# Patient Record
Sex: Male | Born: 1945 | Race: Black or African American | Hispanic: No | Marital: Married | Smoking: Former smoker
Health system: Southern US, Community
[De-identification: ages and names within clinical notes are randomized; demographics above are authoritative.]

## PROBLEM LIST (undated history)

## (undated) DIAGNOSIS — I6930 Unspecified sequelae of cerebral infarction: Secondary | ICD-10-CM

## (undated) DIAGNOSIS — Z973 Presence of spectacles and contact lenses: Secondary | ICD-10-CM

## (undated) DIAGNOSIS — Z86718 Personal history of other venous thrombosis and embolism: Secondary | ICD-10-CM

## (undated) DIAGNOSIS — R350 Frequency of micturition: Secondary | ICD-10-CM

## (undated) DIAGNOSIS — R3915 Urgency of urination: Secondary | ICD-10-CM

## (undated) DIAGNOSIS — E7800 Pure hypercholesterolemia, unspecified: Secondary | ICD-10-CM

## (undated) DIAGNOSIS — E119 Type 2 diabetes mellitus without complications: Secondary | ICD-10-CM

## (undated) DIAGNOSIS — N400 Enlarged prostate without lower urinary tract symptoms: Secondary | ICD-10-CM

## (undated) DIAGNOSIS — Z87440 Personal history of urinary (tract) infections: Secondary | ICD-10-CM

## (undated) DIAGNOSIS — E785 Hyperlipidemia, unspecified: Secondary | ICD-10-CM

## (undated) DIAGNOSIS — G709 Myoneural disorder, unspecified: Secondary | ICD-10-CM

## (undated) DIAGNOSIS — N35919 Unspecified urethral stricture, male, unspecified site: Secondary | ICD-10-CM

## (undated) DIAGNOSIS — I10 Essential (primary) hypertension: Secondary | ICD-10-CM

## (undated) DIAGNOSIS — Z974 Presence of external hearing-aid: Secondary | ICD-10-CM

## (undated) DIAGNOSIS — Z95828 Presence of other vascular implants and grafts: Secondary | ICD-10-CM

## (undated) DIAGNOSIS — I82402 Acute embolism and thrombosis of unspecified deep veins of left lower extremity: Secondary | ICD-10-CM

## (undated) DIAGNOSIS — R310 Gross hematuria: Secondary | ICD-10-CM

## (undated) DIAGNOSIS — M21372 Foot drop, left foot: Secondary | ICD-10-CM

## (undated) DIAGNOSIS — R339 Retention of urine, unspecified: Secondary | ICD-10-CM

## (undated) DIAGNOSIS — D649 Anemia, unspecified: Secondary | ICD-10-CM

## (undated) DIAGNOSIS — K5790 Diverticulosis of intestine, part unspecified, without perforation or abscess without bleeding: Secondary | ICD-10-CM

## (undated) DIAGNOSIS — K219 Gastro-esophageal reflux disease without esophagitis: Secondary | ICD-10-CM

## (undated) DIAGNOSIS — R531 Weakness: Secondary | ICD-10-CM

## (undated) DIAGNOSIS — Z87448 Personal history of other diseases of urinary system: Secondary | ICD-10-CM

## (undated) DIAGNOSIS — I6529 Occlusion and stenosis of unspecified carotid artery: Secondary | ICD-10-CM

## (undated) DIAGNOSIS — I639 Cerebral infarction, unspecified: Secondary | ICD-10-CM

## (undated) HISTORY — DX: Acute embolism and thrombosis of unspecified deep veins of left lower extremity: I82.402

## (undated) HISTORY — DX: Occlusion and stenosis of unspecified carotid artery: I65.29

## (undated) HISTORY — DX: Cerebral infarction, unspecified: I63.9

## (undated) HISTORY — DX: Diverticulosis of intestine, part unspecified, without perforation or abscess without bleeding: K57.90

---

## 2006-10-26 HISTORY — PX: IVC FILTER PLACEMENT (ARMC HX): HXRAD1551

## 2011-12-21 DIAGNOSIS — B351 Tinea unguium: Secondary | ICD-10-CM | POA: Diagnosis not present

## 2011-12-21 DIAGNOSIS — L03039 Cellulitis of unspecified toe: Secondary | ICD-10-CM | POA: Diagnosis not present

## 2012-03-17 DIAGNOSIS — M79609 Pain in unspecified limb: Secondary | ICD-10-CM | POA: Diagnosis not present

## 2012-03-17 DIAGNOSIS — B351 Tinea unguium: Secondary | ICD-10-CM | POA: Diagnosis not present

## 2012-08-11 DIAGNOSIS — M79609 Pain in unspecified limb: Secondary | ICD-10-CM | POA: Diagnosis not present

## 2012-08-11 DIAGNOSIS — B351 Tinea unguium: Secondary | ICD-10-CM | POA: Diagnosis not present

## 2012-09-28 ENCOUNTER — Emergency Department (HOSPITAL_COMMUNITY): Payer: Medicare Other

## 2012-09-28 ENCOUNTER — Encounter (HOSPITAL_COMMUNITY): Payer: Self-pay | Admitting: Cardiology

## 2012-09-28 ENCOUNTER — Inpatient Hospital Stay (HOSPITAL_COMMUNITY)
Admission: EM | Admit: 2012-09-28 | Discharge: 2012-10-03 | DRG: 690 | Disposition: A | Discharge status: Home / Self Care | Payer: Medicare Other | Attending: Internal Medicine | Admitting: Internal Medicine

## 2012-09-28 DIAGNOSIS — N10 Acute tubulo-interstitial nephritis: Secondary | ICD-10-CM | POA: Diagnosis present

## 2012-09-28 DIAGNOSIS — I10 Essential (primary) hypertension: Secondary | ICD-10-CM | POA: Diagnosis present

## 2012-09-28 DIAGNOSIS — R6889 Other general symptoms and signs: Secondary | ICD-10-CM | POA: Diagnosis not present

## 2012-09-28 DIAGNOSIS — Z79899 Other long term (current) drug therapy: Secondary | ICD-10-CM

## 2012-09-28 DIAGNOSIS — R112 Nausea with vomiting, unspecified: Secondary | ICD-10-CM | POA: Diagnosis not present

## 2012-09-28 DIAGNOSIS — R5381 Other malaise: Secondary | ICD-10-CM | POA: Diagnosis not present

## 2012-09-28 DIAGNOSIS — E7800 Pure hypercholesterolemia, unspecified: Secondary | ICD-10-CM | POA: Diagnosis present

## 2012-09-28 DIAGNOSIS — H9190 Unspecified hearing loss, unspecified ear: Secondary | ICD-10-CM | POA: Diagnosis present

## 2012-09-28 DIAGNOSIS — N390 Urinary tract infection, site not specified: Secondary | ICD-10-CM | POA: Diagnosis present

## 2012-09-28 DIAGNOSIS — A419 Sepsis, unspecified organism: Secondary | ICD-10-CM | POA: Diagnosis not present

## 2012-09-28 DIAGNOSIS — Z7982 Long term (current) use of aspirin: Secondary | ICD-10-CM

## 2012-09-28 DIAGNOSIS — R509 Fever, unspecified: Secondary | ICD-10-CM | POA: Diagnosis present

## 2012-09-28 DIAGNOSIS — E785 Hyperlipidemia, unspecified: Secondary | ICD-10-CM | POA: Diagnosis present

## 2012-09-28 DIAGNOSIS — R110 Nausea: Secondary | ICD-10-CM | POA: Diagnosis present

## 2012-09-28 DIAGNOSIS — Z833 Family history of diabetes mellitus: Secondary | ICD-10-CM

## 2012-09-28 DIAGNOSIS — Z8673 Personal history of transient ischemic attack (TIA), and cerebral infarction without residual deficits: Secondary | ICD-10-CM

## 2012-09-28 HISTORY — DX: Essential (primary) hypertension: I10

## 2012-09-28 HISTORY — DX: Gastro-esophageal reflux disease without esophagitis: K21.9

## 2012-09-28 HISTORY — DX: Pure hypercholesterolemia, unspecified: E78.00

## 2012-09-28 LAB — CBC WITH DIFFERENTIAL/PLATELET
Basophils Absolute: 0 10*3/uL (ref 0.0–0.1)
Basophils Relative: 0 % (ref 0–1)
Eosinophils Absolute: 0 10*3/uL (ref 0.0–0.7)
Eosinophils Relative: 0 % (ref 0–5)
HCT: 47.9 % (ref 39.0–52.0)
Hemoglobin: 16.4 g/dL (ref 13.0–17.0)
Lymphocytes Relative: 5 % — ABNORMAL LOW (ref 12–46)
Lymphs Abs: 0.9 10*3/uL (ref 0.7–4.0)
MCH: 29.6 pg (ref 26.0–34.0)
MCHC: 34.2 g/dL (ref 30.0–36.0)
MCV: 86.5 fL (ref 78.0–100.0)
Monocytes Absolute: 1.9 10*3/uL — ABNORMAL HIGH (ref 0.1–1.0)
Monocytes Relative: 10 % (ref 3–12)
Neutro Abs: 15.2 10*3/uL — ABNORMAL HIGH (ref 1.7–7.7)
Neutrophils Relative %: 84 % — ABNORMAL HIGH (ref 43–77)
Platelets: 250 10*3/uL (ref 150–400)
RBC: 5.54 MIL/uL (ref 4.22–5.81)
RDW: 13.2 % (ref 11.5–15.5)
WBC: 18 10*3/uL — ABNORMAL HIGH (ref 4.0–10.5)

## 2012-09-28 MED ORDER — ACETAMINOPHEN 325 MG PO TABS
650.0000 mg | ORAL_TABLET | Freq: Once | ORAL | Status: AC
  Administered 2012-09-28: 650 mg via ORAL
  Filled 2012-09-28: qty 2

## 2012-09-28 MED ORDER — SODIUM CHLORIDE 0.9 % IV BOLUS (SEPSIS)
1000.0000 mL | Freq: Once | INTRAVENOUS | Status: AC
  Administered 2012-09-28: 1000 mL via INTRAVENOUS

## 2012-09-28 NOTE — ED Provider Notes (Signed)
History     CSN: [id number]  Arrival date & time 09/28/12  1900   First MD Initiated Contact with Patient 09/28/12 2332      Chief Complaint  Patient presents with  . Fever    (Consider location/radiation/quality/duration/timing/severity/associated sxs/prior treatment) HPI Comments: 66 year old male with a history of urinary infections, stroke and hypertension who presents with one day of fever. Family member states that last night he had a fever, it has been persistent throughout the day, associated with chills and body shaking but not associated with coughing, shortness of breath, abdominal pain, dysuria, diarrhea, rashes, sore throat, sinus tenderness, headache or blurred vision. He was able to ambulate with his cane earlier in the day but throughout the day he has had severe fatigue, increased sleepiness and no appetite. He has not had anything to eat or drink today.  The symptoms are moderate, persistent, nothing makes better or worse.  Patient is a 66 y.o. male presenting with fever. The history is provided by the patient.  Fever Primary symptoms of the febrile illness include fever.    Past Medical History  Diagnosis Date  . Stroke   . Hypertension   . Hypercholesteremia     History reviewed. No pertinent past surgical history.  History reviewed. No pertinent family history.  History  Substance Use Topics  . Smoking status: Not on file  . Smokeless tobacco: Not on file  . Alcohol Use:       Review of Systems  Constitutional: Positive for fever.  All other systems reviewed and are negative.    Allergies  Review of patient's allergies indicates no known allergies.  Home Medications   Current Outpatient Rx  Name  Route  Sig  Dispense  Refill  . ASPIRIN 81 MG PO CHEW   Oral   Chew 81 mg by mouth daily.         Marland Kitchen LOSARTAN POTASSIUM 100 MG PO TABS   Oral   Take 50 mg by mouth daily.         Marland Kitchen PRESCRIPTION MEDICATION   Oral   Take 1 tablet by mouth  daily. For cholesterol           BP 111/60  Pulse 99  Temp 99.9 F (37.7 C) (Oral)  Resp 18  SpO2 97%  Physical Exam  Nursing note and vitals reviewed. Constitutional: He appears well-developed and well-nourished. No distress.  HENT:  Head: Normocephalic and atraumatic.  Mouth/Throat: Oropharynx is clear and moist. No oropharyngeal exudate.  Eyes: Conjunctivae normal and EOM are normal. Pupils are equal, round, and reactive to light. Right eye exhibits no discharge. Left eye exhibits no discharge. No scleral icterus.  Neck: Normal range of motion. Neck supple. No JVD present. No thyromegaly present.  Cardiovascular: Normal rate, regular rhythm, normal heart sounds and intact distal pulses.  Exam reveals no gallop and no friction rub.   No murmur heard. Pulmonary/Chest: Effort normal and breath sounds normal. No respiratory distress. He has no wheezes. He has no rales.  Abdominal: Soft. Bowel sounds are normal. He exhibits no distension and no mass. There is no tenderness.  Musculoskeletal: Normal range of motion. He exhibits no edema and no tenderness.  Lymphadenopathy:    He has no cervical adenopathy.  Neurological: He is alert. Coordination normal.  Skin: Skin is warm and dry. No rash noted. No erythema.  Psychiatric: He has a normal mood and affect. His behavior is normal.    ED Course  Procedures (including critical  care time)  Labs Reviewed  URINALYSIS, ROUTINE W REFLEX MICROSCOPIC - Abnormal; Notable for the following:    Color, Urine AMBER (*)  BIOCHEMICALS MAY BE AFFECTED BY COLOR   APPearance CLOUDY (*)     Hgb urine dipstick LARGE (*)     Ketones, ur 15 (*)     Leukocytes, UA MODERATE (*)     All other components within normal limits  BASIC METABOLIC PANEL - Abnormal; Notable for the following:    Glucose, Bld 163 (*)     GFR calc non Af Amer 69 (*)     GFR calc Af Amer 80 (*)     All other components within normal limits  CBC WITH DIFFERENTIAL - Abnormal;  Notable for the following:    WBC 18.0 (*)     Neutrophils Relative 84 (*)     Neutro Abs 15.2 (*)     Lymphocytes Relative 5 (*)     Monocytes Absolute 1.9 (*)     All other components within normal limits  URINE MICROSCOPIC-ADD ON - Abnormal; Notable for the following:    Bacteria, UA MANY (*)     All other components within normal limits  URINE CULTURE   Dg Chest Port 1 View  09/29/2012  *RADIOLOGY REPORT*  Clinical Data: Fever.  PORTABLE CHEST - 1 VIEW  Comparison: None.  Findings: Heart size is normal.  Both lungs are clear.  No evidence of pleural effusion.  IMPRESSION: No acute findings.   Original Report Authenticated By: Myles Rosenthal, M.D.      1. Sepsis   2. UTI (lower urinary tract infection)       MDM  The patient appears to have some myalgias, he has left arm flexion contracture and mild weakness of the left lower extremities, this is consistent with his prior history of stroke. He does have a mild tachycardia on arrival with a fever but this has defervesced after medications. We'll obtain a chest x-ray urinalysis and lab work to evaluate for source of infection.   Urinalysis positive for urinary tract infection, culture sent, white blood cell count elevated with a leukocytosis, fever has defervesced with medications as has tachycardia, patient appears more stable at this time, we'll start giving IV antibiotics, IV fluids and placed the patient in observation unit for ongoing treatment overnight. Again the patient has no complaints, has normal mental status.  Rocephin ordered  Patient reevaluated, normal metabolic panel, urinalysis with obvious urinary infection, CBC with 18,000 white count. Due to the fact that the patient meets systemic inflammatory response syndrome criteria with a source in the urine it is technically sepsis, he does not appear to be in shock but has persistent tachycardia. He has been given antibiotics, close observation, the patient will be admitted.  Critical care delivered.  CRITICAL CARE Performed by: Vida Roller   Total critical care time: 35  Critical care time was exclusive of separately billable procedures and treating other patients.  Critical care was necessary to treat or prevent imminent or life-threatening deterioration.  Critical care was time spent personally by me on the following activities: development of treatment plan with patient and/or surrogate as well as nursing, discussions with consultants, evaluation of patient's response to treatment, examination of patient, obtaining history from patient or surrogate, ordering and performing treatments and interventions, ordering and review of laboratory studies, ordering and review of radiographic studies, pulse oximetry and re-evaluation of patient's condition.    Vida Roller, MD 09/29/12 [phone]

## 2012-09-28 NOTE — ED Notes (Signed)
Pt reports feeling sick, pt had 103 temp at home, 102 on arrival.  Pt hasn't been drinking anything today but denies n/v/d.  Pt's wife st's he was okay until he went to the gym, they think he picked up a bug at the gym.

## 2012-09-28 NOTE — ED Notes (Signed)
Pt to department via EMS from home- pt with hx of stroke and left-sided deficits. Pt was taking an nap and woke up this afternoon sweaty and hot to touch. Reports a fever of 103 by wife. Reports increased urination today, but denies any pain or odor. Pt remains A&Ox4 on arrival. Bp-160/85  Hr-132

## 2012-09-28 NOTE — ED Notes (Signed)
I gave the patient a warm blanket. 

## 2012-09-29 ENCOUNTER — Encounter (HOSPITAL_COMMUNITY): Payer: Self-pay | Admitting: Internal Medicine

## 2012-09-29 DIAGNOSIS — N390 Urinary tract infection, site not specified: Secondary | ICD-10-CM | POA: Diagnosis present

## 2012-09-29 DIAGNOSIS — Z8673 Personal history of transient ischemic attack (TIA), and cerebral infarction without residual deficits: Secondary | ICD-10-CM

## 2012-09-29 DIAGNOSIS — H9190 Unspecified hearing loss, unspecified ear: Secondary | ICD-10-CM | POA: Diagnosis present

## 2012-09-29 DIAGNOSIS — N10 Acute tubulo-interstitial nephritis: Secondary | ICD-10-CM | POA: Diagnosis present

## 2012-09-29 DIAGNOSIS — R509 Fever, unspecified: Secondary | ICD-10-CM | POA: Diagnosis present

## 2012-09-29 DIAGNOSIS — R110 Nausea: Secondary | ICD-10-CM | POA: Diagnosis present

## 2012-09-29 DIAGNOSIS — I10 Essential (primary) hypertension: Secondary | ICD-10-CM | POA: Diagnosis present

## 2012-09-29 LAB — URINALYSIS, ROUTINE W REFLEX MICROSCOPIC
Bilirubin Urine: NEGATIVE
Glucose, UA: NEGATIVE mg/dL
Ketones, ur: 15 mg/dL — AB
Nitrite: NEGATIVE
Protein, ur: NEGATIVE mg/dL
Specific Gravity, Urine: 1.021 (ref 1.005–1.030)
Urobilinogen, UA: 1 mg/dL (ref 0.0–1.0)
pH: 5.5 (ref 5.0–8.0)

## 2012-09-29 LAB — BASIC METABOLIC PANEL
BUN: 11 mg/dL (ref 6–23)
CO2: 23 mEq/L (ref 19–32)
Calcium: 9.7 mg/dL (ref 8.4–10.5)
Chloride: 97 mEq/L (ref 96–112)
Creatinine, Ser: 1.09 mg/dL (ref 0.50–1.35)
GFR calc Af Amer: 80 mL/min — ABNORMAL LOW (ref >90)
GFR calc non Af Amer: 69 mL/min — ABNORMAL LOW (ref >90)
Glucose, Bld: 163 mg/dL — ABNORMAL HIGH (ref 70–99)
Potassium: 3.7 mEq/L (ref 3.5–5.1)
Sodium: 137 mEq/L (ref 135–145)

## 2012-09-29 LAB — URINE MICROSCOPIC-ADD ON

## 2012-09-29 MED ORDER — ONDANSETRON HCL 4 MG PO TABS: 4.0000 mg | ORAL_TABLET | Freq: Four times a day (QID) | ORAL | Status: DC | PRN

## 2012-09-29 MED ORDER — SIMVASTATIN 20 MG PO TABS
20.0000 mg | ORAL_TABLET | Freq: Every day | ORAL | Status: DC
  Administered 2012-09-29 – 2012-10-02 (×4): 20 mg via ORAL
  Filled 2012-09-29 (×5): qty 1

## 2012-09-29 MED ORDER — ONDANSETRON HCL 4 MG/2ML IJ SOLN: 4.0000 mg | Freq: Four times a day (QID) | INTRAMUSCULAR | Status: DC | PRN

## 2012-09-29 MED ORDER — SODIUM CHLORIDE 0.9 % IV SOLN
Freq: Once | INTRAVENOUS | Status: AC
  Administered 2012-09-29: 02:00:00 via INTRAVENOUS

## 2012-09-29 MED ORDER — DEXTROSE 5 % IV SOLN
1.0000 g | Freq: Once | INTRAVENOUS | Status: AC
  Administered 2012-09-29: 1 g via INTRAVENOUS
  Filled 2012-09-29: qty 10

## 2012-09-29 MED ORDER — SODIUM CHLORIDE 0.9 % IV SOLN
INTRAVENOUS | Status: DC
  Administered 2012-09-29 – 2012-09-30 (×4): via INTRAVENOUS

## 2012-09-29 MED ORDER — LOSARTAN POTASSIUM 50 MG PO TABS
50.0000 mg | ORAL_TABLET | Freq: Every day | ORAL | Status: DC
  Administered 2012-09-29 – 2012-10-03 (×5): 50 mg via ORAL
  Filled 2012-09-29 (×5): qty 1

## 2012-09-29 MED ORDER — ACETAMINOPHEN 650 MG RE SUPP: 650.0000 mg | Freq: Four times a day (QID) | RECTAL | Status: DC | PRN

## 2012-09-29 MED ORDER — ACETAMINOPHEN 325 MG PO TABS
650.0000 mg | ORAL_TABLET | Freq: Four times a day (QID) | ORAL | Status: DC | PRN
  Administered 2012-09-30 – 2012-10-01 (×2): 650 mg via ORAL
  Filled 2012-09-29 (×2): qty 2

## 2012-09-29 MED ORDER — DEXTROSE 5 % IV SOLN
1.0000 g | INTRAVENOUS | Status: DC
  Administered 2012-09-29: 1 g via INTRAVENOUS
  Filled 2012-09-29 (×2): qty 10

## 2012-09-29 MED ORDER — ASPIRIN 81 MG PO CHEW
81.0000 mg | CHEWABLE_TABLET | Freq: Every day | ORAL | Status: DC
  Administered 2012-09-29 – 2012-10-03 (×5): 81 mg via ORAL
  Filled 2012-09-29 (×5): qty 1

## 2012-09-29 MED ORDER — ENOXAPARIN SODIUM 40 MG/0.4ML ~~LOC~~ SOLN
40.0000 mg | SUBCUTANEOUS | Status: DC
  Administered 2012-09-29 – 2012-10-02 (×4): 40 mg via SUBCUTANEOUS
  Filled 2012-09-29 (×5): qty 0.4

## 2012-09-29 NOTE — Progress Notes (Signed)
UR Completed Brooks Kinnan Graves-Bigelow, RN,BSN [phone]  

## 2012-09-29 NOTE — H&P (Signed)
Hospital Admission Note Date: 09/29/2012  Patient name: Robert [name] Medical record number: [id number] Date of birth: 1946-05-09 Age: 66 y.o. Gender: male PCP: DEFAULT,PROVIDER, MD  Medical Service: Internal medicine teaching service  Attending physician: Dr. Rogelia Boga    1st Contact: Sadek    Pager: [phone] 2nd Contact: Manson Passey    Pager: [phone] After 5 pm or weekends: 1st Contact:      Pager: [phone] 2nd Contact:      Pager: [phone]  Chief Complaint: Fever  History of Present Illness: Patient is a pleasant 39 year man with past history of CVA, hypertension, hyper lipidemia who comes to the hospital with chief complaint of fever since yesterday. He is accompanied by his wife who provides most of the history. Patient started having fever yesterday- 103-continuous not resolving. Associated with urinary frequency, loss of appetite for one day, some malaise, confusion. Patient does not report any cough, chest pain, abdominal pain, back pain, Burning micturition, diarrhea, headache, neck pain. 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 Last UTI was about 6 weeks before- was diagnosed at urology clinic in [place] and was given antibiotics for a week- unclear which.  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 Patient followed by a urologist at [facility].  Meds: Current Outpatient Rx  Name  Route  Sig  Dispense  Refill  . ASPIRIN 81 MG PO CHEW   Oral   Chew 81 mg by mouth daily.         Marland Kitchen LOSARTAN POTASSIUM 100 MG PO TABS   Oral   Take 50 mg by mouth daily.         Marland Kitchen PRESCRIPTION MEDICATION   Oral   Take 1 tablet by mouth daily. For cholesterol           Allergies: Allergies as of 09/28/2012  . (No Known Allergies)   Past Medical History  Diagnosis Date  . Stroke   . Hypertension   . Hypercholesteremia    History reviewed. No pertinent past surgical  history. Family History  Problem Relation Age of Onset  . Diabetes Sister   . Lung cancer Brother     Post 9/11 voluntary work in [employer]   History   Social History  . Marital Status: Married    Spouse Name: N/A    Number of Children: N/A  . Years of Education: N/A   Occupational History  . Not on file.   Social History Main Topics  . Smoking status: Not on file  . Smokeless tobacco: Not on file  . Alcohol Use:   . Drug Use:   . Sexually Active:    Other Topics Concern  . Not on file   Social History Narrative   Lives in [place] with wife. He is a Cytogeneticist.Has one son who lives in [place]. He is healthy.[facility] [place], [place].    Review of Systems: As per history of present illness.  Physical Exam: Blood pressure 121/60, pulse 101, temperature 99.9 F (37.7 C), temperature source Oral, resp. rate 31, SpO2 94.00%. Constitutional: Vital signs reviewed.  Patient is a well-developed and well-nourished  in no acute distress and cooperative with exam. Alert and oriented x3.  Head: Normocephalic and atraumatic Mouth: no erythema or exudates, MMM Eyes: PERRL, EOMI, conjunctivae normal, No scleral icterus.  Neck: Supple, Trachea midline normal ROM, No JVD Cardiovascular: Tachycardic, S1 normal, S2  normal, no MRG, pulses symmetric and intact bilaterally Pulmonary/Chest: CTAB, no wheezes, rales, or rhonchi Abdominal: Soft. Non-tender, non-distended, bowel sounds are normal GU: no CVA tenderness Musculoskeletal: No joint deformities, erythema, or stiffness, ROM full and no nontender Neurological: A&O x3, 5/5 strength in right upper and lower extremity. 3/5 strength in left upper lower extremity. Some contracture noted in left upper extremity- consistent with previous CVA.  Skin: Warm, dry and intact. No rash, cyanosis, or clubbing.  Psychiatric: Normal mood and affect. speech and behavior is normal. Judgment and thought content normal. Cognition and  memory are normal.     Lab results: Basic Metabolic Panel:  [facility] 09/28/12 1932  NA 137  K 3.7  CL 97  CO2 23  GLUCOSE 163*  BUN 11  CREATININE 1.09  CALCIUM 9.7  MG --  PHOS --   CBC:  Basename 09/28/12 1932  WBC 18.0*  NEUTROABS 15.2*  HGB 16.4  HCT 47.9  MCV 86.5  PLT 250   Urinalysis:  Basename 09/28/12 2343  COLORURINE AMBER*  LABSPEC 1.021  PHURINE 5.5  GLUCOSEU NEGATIVE  HGBUR LARGE*  BILIRUBINUR NEGATIVE  KETONESUR 15*  PROTEINUR NEGATIVE  UROBILINOGEN 1.0  NITRITE NEGATIVE  LEUKOCYTESUR MODERATE*    Imaging results:  Dg Chest Port 1 View  09/29/2012  *RADIOLOGY REPORT*  Clinical Data: Fever.  PORTABLE CHEST - 1 VIEW  Comparison: None.  Findings: Heart size is normal.  Both lungs are clear.  No evidence of pleural effusion.  IMPRESSION: No acute findings.   Original Report Authenticated By: Myles Rosenthal, M.D.     Other results:  Assessment & Plan by Problem: Principal Problem:  *Acute pyelonephritis Active Problems:  Nausea  Fever  HTN (hypertension)  H/O: CVA (cerebrovascular accident)  Hearing loss  1) Acute pyelonephritis:  Patient with fever more than 38C along with frequency of urination and UA and microscopy showing urinary tract infection- most likely fits the definition of acute pyelonephritis. Patient although does not have any nausea, vomiting or flank pain. He does have history of recurrent UTIs- last infection about 2 weeks before- unclear about the pathogen or the name of antibiotic given. White count- 18,000 with tachycardia. Patient meets criteria for sepsis secondary to UTI/pyelonephritis.  Plan- admit patient to telemetry. - IV hydration with normal saline at 150 cc per hour. - IV ceftriaxone for 3 days and transition to oral antibiotics after urine culture and sensitivity results. - Follow urine culture results to narrow down antibiotics. - No imaging for now. If patient develops fever again and no improvement in  symptoms, we'll consider ultrasound versus CT abdomen to evaluate for complicated pyelonephritis/abscess. - Patient received ceftriaxone before getting blood cultures.  2) History of CVA : CVA about 5 years before with residual left sided weakness. No worsening. - Continue statin and aspirin.   3)  Tachycardia: Mostly secondary to fever, infection and mild dehydration as patient did not have anything to eat or drink yesterday. - Telemetry monitoring overnight. - Normal saline at 150 cc per hour   4) Hypertension : Continue ARB   5) Hyperlipidemia : Continue statin   6) DVT prophylaxis: Lovenox.   Dispo: Disposition is deferred at this time, awaiting improvement of current medical problems. Anticipated discharge in approximately 2 day(s).   The [facility], therefore will not be requiring [facility] follow-up after discharge.   The patient doesnot have transportation limitations that hinder transportation to clinic appointments.  Signed: Adrion Menz 09/29/2012, 10:53 AM

## 2012-09-29 NOTE — Progress Notes (Signed)
   CARE MANAGEMENT NOTE 09/29/2012  Patient:  Robert [name], Robert [name]   Account Number:  [id number]  Date Initiated:  09/29/2012  Documentation initiated by:  GRAVES-BIGELOW,Marlet Korte  Subjective/Objective Assessment:   Pt admitted with fevers. Home with wife.     Action/Plan:   CM will continue to f/u for disposition needs. CM did call the [place] in [place] to make them aware that pt is here. Wife under the impression that [facility] would pay for stay howver pt not 100% service connected.   Anticipated DC Date:  10/03/2012   Anticipated DC Plan:  HOME/SELF CARE  In-house referral  Financial Counselor      DC Planning Services  CM consult      Choice offered to / List presented to:             Status of service:  In process, will continue to follow Medicare Important Message given?   (If response is "NO", the following Medicare IM given date fields will be blank) Date Medicare IM given:   Date Additional Medicare IM given:    Discharge Disposition:    Per UR Regulation:  Reviewed for med. necessity/level of care/duration of stay  If discussed at Long Length of Stay Meetings, dates discussed:    Comments:

## 2012-09-29 NOTE — ED Notes (Addendum)
Pt alert, NAD, calm, interactive, skin W&D, resps e/u, speaking in clear complete sentences, VSS, denies sx or complaints, IVF infusing, IV site U, abx complete, ST on monitor HR 115. Blankets removed.

## 2012-09-29 NOTE — ED Provider Notes (Signed)
  Discussed with internal medicine teaching resident, they will admit.  Vida Roller, MD 09/29/12 [phone]

## 2012-09-29 NOTE — ED Notes (Signed)
Arash Karstens (wife):  [phone]

## 2012-09-29 NOTE — ED Notes (Signed)
Alert, resting, NAD, calm, denies needs, pain or other sx.

## 2012-09-29 NOTE — Clinical Social Work Psychosocial (Signed)
     Clinical Social Work Department BRIEF PSYCHOSOCIAL ASSESSMENT 09/29/2012  Patient:  Robert [name], Robert [name]     Account Number:  [id number]     Admit date:  09/28/2012  Clinical Social Worker:  Margaree Mackintosh  Date/Time:  09/29/2012 12:00 M  Referred by:  Physician  Date Referred:  09/29/2012 Referred for  Other - See comment   Other Referral:   Financial Questions.   Interview type:  Patient Other interview type:   With wife present.    PSYCHOSOCIAL DATA Living Status:  FAMILY Admitted from facility:   Level of care:   Primary support name:  Norm Wray: [phone] Primary support relationship to patient:  SPOUSE Degree of support available:   Adequate.    CURRENT CONCERNS Current Concerns  Financial Resources   Other Concerns:    SOCIAL WORK ASSESSMENT / PLAN Clinical Social Worker recieved referral indicating pt requesting to speak with CSW to discuss financial resources.  CSW reviewed chart and met with pt and pt's wife at bedside. CSW introduced self, explained role, and provided support.  CSW provided active listening; pt's wife shared that pt is a [place] patient and "the [facility] needs to know we are here so we won't get the bill".  CSW reviewed CSW role and validated wife and pt's feelings.  CSW gathered contact information to [place].  CSW staffed case with RNCM who spoke in more detail with pt and wife.  CSW to sign off, please re consult if needed.   Assessment/plan status:  Information/Referral to [facility] Other assessment/ plan:   Information/referral to community resources:   [facility] benefits  RNCM    PATIENTS/FAMILYS RESPONSE TO PLAN OF CARE: Pt and wife were both pleasant and engaged in conversation. Pt and wife thanked CSW for intervention.

## 2012-09-30 DIAGNOSIS — R509 Fever, unspecified: Secondary | ICD-10-CM | POA: Diagnosis not present

## 2012-09-30 DIAGNOSIS — Z8673 Personal history of transient ischemic attack (TIA), and cerebral infarction without residual deficits: Secondary | ICD-10-CM | POA: Diagnosis not present

## 2012-09-30 DIAGNOSIS — N10 Acute tubulo-interstitial nephritis: Secondary | ICD-10-CM | POA: Diagnosis present

## 2012-09-30 DIAGNOSIS — E7800 Pure hypercholesterolemia, unspecified: Secondary | ICD-10-CM | POA: Diagnosis present

## 2012-09-30 DIAGNOSIS — Z79899 Other long term (current) drug therapy: Secondary | ICD-10-CM | POA: Diagnosis not present

## 2012-09-30 DIAGNOSIS — I10 Essential (primary) hypertension: Secondary | ICD-10-CM | POA: Diagnosis present

## 2012-09-30 DIAGNOSIS — Z833 Family history of diabetes mellitus: Secondary | ICD-10-CM | POA: Diagnosis not present

## 2012-09-30 DIAGNOSIS — H9190 Unspecified hearing loss, unspecified ear: Secondary | ICD-10-CM | POA: Diagnosis present

## 2012-09-30 DIAGNOSIS — Z7982 Long term (current) use of aspirin: Secondary | ICD-10-CM | POA: Diagnosis not present

## 2012-09-30 DIAGNOSIS — E785 Hyperlipidemia, unspecified: Secondary | ICD-10-CM | POA: Diagnosis present

## 2012-09-30 LAB — CBC WITH DIFFERENTIAL/PLATELET
Basophils Absolute: 0 10*3/uL (ref 0.0–0.1)
Basophils Relative: 0 % (ref 0–1)
Eosinophils Absolute: 0.1 10*3/uL (ref 0.0–0.7)
Eosinophils Relative: 1 % (ref 0–5)
HCT: 40.4 % (ref 39.0–52.0)
Hemoglobin: 13.9 g/dL (ref 13.0–17.0)
Lymphocytes Relative: 14 % (ref 12–46)
Lymphs Abs: 1.9 10*3/uL (ref 0.7–4.0)
MCH: 29.4 pg (ref 26.0–34.0)
MCHC: 34.4 g/dL (ref 30.0–36.0)
MCV: 85.6 fL (ref 78.0–100.0)
Monocytes Absolute: 2 10*3/uL — ABNORMAL HIGH (ref 0.1–1.0)
Monocytes Relative: 15 % — ABNORMAL HIGH (ref 3–12)
Neutro Abs: 9.1 10*3/uL — ABNORMAL HIGH (ref 1.7–7.7)
Neutrophils Relative %: 70 % (ref 43–77)
Platelets: 214 10*3/uL (ref 150–400)
RBC: 4.72 MIL/uL (ref 4.22–5.81)
RDW: 13.2 % (ref 11.5–15.5)
WBC: 13.1 10*3/uL — ABNORMAL HIGH (ref 4.0–10.5)

## 2012-09-30 LAB — COMPREHENSIVE METABOLIC PANEL
ALT: 17 U/L (ref 0–53)
AST: 24 U/L (ref 0–37)
Albumin: 2.6 g/dL — ABNORMAL LOW (ref 3.5–5.2)
Alkaline Phosphatase: 69 U/L (ref 39–117)
BUN: 9 mg/dL (ref 6–23)
CO2: 23 mEq/L (ref 19–32)
Calcium: 8.6 mg/dL (ref 8.4–10.5)
Chloride: 105 mEq/L (ref 96–112)
Creatinine, Ser: 0.97 mg/dL (ref 0.50–1.35)
GFR calc Af Amer: >90 mL/min (ref >90)
GFR calc non Af Amer: 85 mL/min — ABNORMAL LOW (ref >90)
Glucose, Bld: 118 mg/dL — ABNORMAL HIGH (ref 70–99)
Potassium: 3.7 mEq/L (ref 3.5–5.1)
Sodium: 139 mEq/L (ref 135–145)
Total Bilirubin: 0.9 mg/dL (ref 0.3–1.2)
Total Protein: 6.4 g/dL (ref 6.0–8.3)

## 2012-09-30 MED ORDER — CIPROFLOXACIN IN D5W 400 MG/200ML IV SOLN: 400.0000 mg | Freq: Two times a day (BID) | INTRAVENOUS | Status: DC

## 2012-09-30 MED ORDER — PIPERACILLIN-TAZOBACTAM 3.375 G IVPB
3.3750 g | Freq: Three times a day (TID) | INTRAVENOUS | Status: DC
  Administered 2012-09-30 – 2012-10-01 (×2): 3.375 g via INTRAVENOUS
  Filled 2012-09-30 (×4): qty 50

## 2012-09-30 MED ORDER — CIPROFLOXACIN IN D5W 400 MG/200ML IV SOLN
400.0000 mg | Freq: Two times a day (BID) | INTRAVENOUS | Status: DC
  Filled 2012-09-30 (×2): qty 200

## 2012-09-30 NOTE — Progress Notes (Signed)
Patients wife stated" he feels hot" and requested temp to be taken.  Temp=101.4.  Called Dr. Virgina Organ, she stated she would enter new orders, and to give ordered tylenol.  Will continue to monitor.

## 2012-09-30 NOTE — Progress Notes (Signed)
ANTIBIOTIC CONSULT NOTE - INITIAL  Pharmacy Consult for Zosyn Indication: Pyelonephritis (spiking fevers on Rocephin)  No Known Allergies  Patient Measurements: Height: 6\' 1"  (185.4 cm) Weight: 229 lb 11.2 oz (104.191 kg) IBW/kg (Calculated) : 79.9   Vital Signs: Temp: 101.2 F (38.4 C) (12/06 1838) BP: 122/59 mmHg (12/06 1849) Pulse Rate: 90  (12/06 1849) Intake/Output from previous day: 12/05 0701 - 12/06 0700 In: -  Out: 1050 [Urine:1050] Intake/Output from this shift:    Labs:  [facility] 09/30/12 0553 09/28/12 1932  WBC 13.1* 18.0*  HGB 13.9 16.4  PLT 214 250  LABCREA -- --  CREATININE 0.97 1.09   Estimated Creatinine Clearance: 96.2 ml/min (by C-G formula based on Cr of 0.97). No results found for this basename: VANCOTROUGH:2,VANCOPEAK:2,VANCORANDOM:2,GENTTROUGH:2,GENTPEAK:2,GENTRANDOM:2,TOBRATROUGH:2,TOBRAPEAK:2,TOBRARND:2,AMIKACINPEAK:2,AMIKACINTROU:2,AMIKACIN:2, in the last 72 hours   Microbiology: Recent Results (from the past 720 hour(s))  URINE CULTURE     Status: Normal (Preliminary result)   Collection Time   09/28/12 11:43 PM      Component Value Range Status Comment   Specimen Description URINE, CLEAN CATCH   Final    Special Requests NONE   Final    Culture  Setup Time 09/29/2012 00:22   Final    Colony Count >=100,000 COLONIES/ML   Final    Culture GRAM NEGATIVE RODS   Final    Report Status PENDING   Incomplete     Medical History: Past Medical History  Diagnosis Date  . Stroke   . Hypertension   . Hypercholesteremia   . GERD (gastroesophageal reflux disease)   . Fever 09/29/2012    Assessment: 66 y.o. M admitted on 12/5 with fever and was found to have acute pyelonephritis. The patient continued to spike fevers and pharmacy was consulted to dose Zosyn for broadened coverage. Last fever this evening was 101.2, WBC 13.1 << 18, CrCl~80-100 ml/min.  Goal of Therapy:  Proper antibiotics for infection/cultures adjusted for renal/hepatic  function   Plan:  1. Zosyn 3.375g IV every 8 hours 2. Will continue to follow renal function, culture results, LOT, and antibiotic de-escalation plans   Georgina Pillion, PharmD, BCPS Clinical Pharmacist Pager: [phone] 09/30/2012 8:11 PM

## 2012-09-30 NOTE — H&P (Signed)
Internal Medicine Teaching Service Attending Note Date: 09/30/2012  Patient name: Robert [name]  Medical record number: [id number]  Date of birth: 1945-12-06   I have seen and evaluated Robert [name] and discussed their care with the Residency Team. Please see Dr Eliane Decree H&P for full details. Robert [name] was admitted for fevers, urinary freq, anorexia, and confusion. He was found to have pyuria and was started on Rocephin. He did have temp of 101.4 last PM. Otherwise, he is asymptomatic and back to baseline.  About 5 yrs ago he had a stroke and had to have a foley. That reportedly caused a stricture and he has had recurrent UTI's since. He was on what sounded like nitrofurantoin for 6 months and never had a UTI during that time. He has a urologist at the [place].   PMHx, meds, allergies, soc hx, fam hx, and ROS were reviewed.  PE :  NAD. Lying in bed HRRR LCTAB Ext no edema  Labs and imaging were reviewed   Assessment and Plan: I agree with the formulated Assessment and Plan with the following changes:   1. Pyelonephritis - Pt started empirically on Rocephin until urine cxs back. Will need to be afebrile for 24 hrs until can change ABX to PO and D/C pt. If he cont to be febrile, I agree with Dr Eliane Decree rec to imaging at that time. Due to his h/o stricture and freq UTI he would be at risk of complications. He would like to get est with a PCP and urologist in [place] and we will assist him with that.   2. H/O urinary stricture and freq UTI - will set pt up with local urologist. May need to consider another course of suppressive ABX tx.  3. H/O CVA, HTN, HLD - cont home meds.  Dispo - once afebrile for 24 hrs, change ABX to PO based on cx results and D/C home.  Burns Spain, MD 12/6/20133:32 PM

## 2012-09-30 NOTE — Progress Notes (Signed)
Subjective: Pt had fever overnight as high 101.4 was asymptomatic. Otherwise VSS. Pt feels overall better this AM. Wife was in room and said no AMS since admission and that pt was taking doxycycline for 2 wk course that he finished about a month ago 2/2 discovered UTI at routine yearly urology visit. Pt tolerated advanced diet w/o n/v/d.   Objective: Vital signs in last 24 hours: Filed Vitals:   09/29/12 1030 09/29/12 1130 09/29/12 2210 09/30/12 0700  BP: 121/60 137/68 101/64 111/74  Pulse: 101 98 91 80  Temp:  98.9 F (37.2 C) 101.4 F (38.6 C) 99.5 F (37.5 C)  TempSrc:  Oral    Resp: 31 18 20 18   Height:  6\' 1"  (1.854 m)    Weight:  229 lb 11.2 oz (104.191 kg)    SpO2: 94% 98% 94% 96%   Weight change:   Intake/Output Summary (Last 24 hours) at 09/30/12 1138 Last data filed at 09/30/12 0800  Gross per 24 hour  Intake    360 ml  Output    850 ml  Net   -490 ml   General: resting in bed HEENT: PERRL, EOMI, no scleral icterus Cardiac: RRR, no rubs, murmurs or gallops Pulm: clear to auscultation bilaterally, moving normal volumes of air Abd: soft, nontender, nondistended, BS present Ext: warm and well perfused, no pedal edema Neuro: alert and oriented X3, cranial nerves II-XII grossly intact   Lab Results: Basic Metabolic Panel:  Lab 09/30/12 1610 09/28/12 1932  NA 139 137  K 3.7 3.7  CL 105 97  CO2 23 23  GLUCOSE 118* 163*  BUN 9 11  CREATININE 0.97 1.09  CALCIUM 8.6 9.7  MG -- --  PHOS -- --   Liver Function Tests:  Lab 09/30/12 0553  AST 24  ALT 17  ALKPHOS 69  BILITOT 0.9  PROT 6.4  ALBUMIN 2.6*   CBC:  Lab 09/30/12 0553 09/28/12 1932  WBC 13.1* 18.0*  NEUTROABS 9.1* 15.2*  HGB 13.9 16.4  HCT 40.4 47.9  MCV 85.6 86.5  PLT 214 250   Urinalysis:  Lab 09/28/12 2343  COLORURINE AMBER*  LABSPEC 1.021  PHURINE 5.5  GLUCOSEU NEGATIVE  HGBUR LARGE*  BILIRUBINUR NEGATIVE  KETONESUR 15*  PROTEINUR NEGATIVE  UROBILINOGEN 1.0  NITRITE NEGATIVE   LEUKOCYTESUR MODERATE*   Micro Results: Recent Results (from the past 240 hour(s))  URINE CULTURE     Status: Normal (Preliminary result)   Collection Time   09/28/12 11:43 PM      Component Value Range Status Comment   Specimen Description URINE, CLEAN CATCH   Final    Special Requests NONE   Final    Culture  Setup Time 09/29/2012 00:22   Final    Colony Count >=100,000 COLONIES/ML   Final    Culture GRAM NEGATIVE RODS   Final    Report Status PENDING   Incomplete    Studies/Results: Dg Chest Port 1 View  09/29/2012  *RADIOLOGY REPORT*  Clinical Data: Fever.  PORTABLE CHEST - 1 VIEW  Comparison: None.  Findings: Heart size is normal.  Both lungs are clear.  No evidence of pleural effusion.  IMPRESSION: No acute findings.   Original Report Authenticated By: Myles Rosenthal, M.D.    Medications: I have reviewed the patient's current medications. Scheduled Meds:   . aspirin  81 mg Oral Daily  . cefTRIAXone (ROCEPHIN)  IV  1 g Intravenous Q24H  . enoxaparin (LOVENOX) injection  40 mg Subcutaneous Q24H  .  losartan  50 mg Oral Daily  . simvastatin  20 mg Oral q1800   Continuous Infusions:   . sodium chloride 150 mL/hr at 09/29/12 2150   PRN Meds:.acetaminophen, acetaminophen, ondansetron (ZOFRAN) IV, ondansetron Assessment/Plan: 66 yo AA man pmh CVA, HTN, HLD, previous recurrent UTI s/p long catherterizations currently not w/indwelling foley p/w fever found to have pyelonephritis.   1. Pyelonephritis: Pt SIRS criteria and UA concerning for UTI found to have pyelonephritis. Pt long standing hx of recurrent UTIs last 6 wks ago and competed recent 14 dy course of doxycycline. 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 Pt interested in possibly establishing urology care in [place], [place]. -prelim UCx gram negative rods....Marland Kitchensensitivites pending -cont  IV ceftriaxone -consider imaging if pt spikes another fever for r/o possible abscess   Dispo: Disposition is deferred at this time, awaiting improvement of current medical problems.  Anticipated discharge in approximately 1-2 day(s).   The patient does have a current PCP (DEFAULT,PROVIDER, MD), therefore will be requiring OPC follow-up after discharge.   The patient does not have transportation limitations that hinder transportation to clinic appointments.  .Services Needed at time of discharge: Y = Yes, Blank = No PT:   OT:   RN:   Equipment:   Other:     LOS: 2 days   Christen Bame 09/30/2012, 11:38 AM [phone]

## 2012-10-01 LAB — URINE CULTURE: Colony Count: >=100000

## 2012-10-01 MED ORDER — CIPROFLOXACIN HCL 500 MG PO TABS
500.0000 mg | ORAL_TABLET | Freq: Two times a day (BID) | ORAL | Status: DC
  Administered 2012-10-01 – 2012-10-03 (×5): 500 mg via ORAL
  Filled 2012-10-01 (×7): qty 1

## 2012-10-01 NOTE — Progress Notes (Signed)
Subjective: Patient had fever to 101.2 overnight, which resolved to 99.5 with tylenol.  The patient notes no dysuria or flank tenderness.  Urine cultures today show sensitivity to cipro.  Patient tolerating PO well.  Objective: Vital signs in last 24 hours: Filed Vitals:   09/30/12 1849 09/30/12 2120 09/30/12 2230 10/01/12 0500  BP: 122/59 118/66  116/75  Pulse: 90 81  69  Temp:  99.5 F (37.5 C) 98.2 F (36.8 C) 99.2 F (37.3 C)  TempSrc:      Resp: 22 20  18   Height:      Weight:      SpO2: 98% 95%  95%   Weight change:   Intake/Output Summary (Last 24 hours) at 10/01/12 1322 Last data filed at 10/01/12 0840  Gross per 24 hour  Intake    660 ml  Output    500 ml  Net    160 ml   General: resting in bed HEENT: PERRL, EOMI, no scleral icterus Cardiac: RRR, no rubs, murmurs or gallops Pulm: clear to auscultation bilaterally, moving normal volumes of air Abd: soft, nontender, nondistended, BS present Ext: warm and well perfused, no pedal edema Neuro: alert and oriented X3, cranial nerves II-XII grossly intact   Lab Results: Basic Metabolic Panel:  Lab 09/30/12 4098 09/28/12 1932  NA 139 137  K 3.7 3.7  CL 105 97  CO2 23 23  GLUCOSE 118* 163*  BUN 9 11  CREATININE 0.97 1.09  CALCIUM 8.6 9.7  MG -- --  PHOS -- --   Liver Function Tests:  Lab 09/30/12 0553  AST 24  ALT 17  ALKPHOS 69  BILITOT 0.9  PROT 6.4  ALBUMIN 2.6*   CBC:  Lab 09/30/12 0553 09/28/12 1932  WBC 13.1* 18.0*  NEUTROABS 9.1* 15.2*  HGB 13.9 16.4  HCT 40.4 47.9  MCV 85.6 86.5  PLT 214 250   Urinalysis:  Lab 09/28/12 2343  COLORURINE AMBER*  LABSPEC 1.021  PHURINE 5.5  GLUCOSEU NEGATIVE  HGBUR LARGE*  BILIRUBINUR NEGATIVE  KETONESUR 15*  PROTEINUR NEGATIVE  UROBILINOGEN 1.0  NITRITE NEGATIVE  LEUKOCYTESUR MODERATE*   Micro Results: Recent Results (from the past 240 hour(s))  URINE CULTURE     Status: Normal   Collection Time   09/28/12 11:43 PM      Component Value  Range Status Comment   Specimen Description URINE, CLEAN CATCH   Final    Special Requests NONE   Final    Culture  Setup Time 09/29/2012 00:22   Final    Colony Count >=100,000 COLONIES/ML   Final    Culture KLEBSIELLA PNEUMONIAE   Final    Report Status 10/01/2012 FINAL   Final    Organism ID, Bacteria KLEBSIELLA PNEUMONIAE   Final    Studies/Results: No results found. Medications: I have reviewed the patient's current medications. Scheduled Meds:    . aspirin  81 mg Oral Daily  . ciprofloxacin  500 mg Oral BID  . enoxaparin (LOVENOX) injection  40 mg Subcutaneous Q24H  . losartan  50 mg Oral Daily  . simvastatin  20 mg Oral q1800  . [DISCONTINUED] cefTRIAXone (ROCEPHIN)  IV  1 g Intravenous Q24H  . [DISCONTINUED] ciprofloxacin  400 mg Intravenous Q12H  . [DISCONTINUED] ciprofloxacin  400 mg Intravenous Q12H  . [DISCONTINUED] piperacillin-tazobactam (ZOSYN)  IV  3.375 g Intravenous Q8H   Continuous Infusions:    . [DISCONTINUED] sodium chloride 150 mL/hr at 09/30/12 1230   PRN Meds:.acetaminophen, acetaminophen, ondansetron (  ZOFRAN) IV, ondansetron Assessment/Plan: 66 yo AA man pmh CVA, HTN, HLD, previous recurrent UTI s/p long catherterizations currently not w/indwelling foley p/w fever found to have pyelonephritis.   1. Pyelonephritis: Pt SIRS criteria and UA concerning for UTI found to have pyelonephritis. Pt long standing hx of recurrent UTIs last 6 wks ago and competed recent 14 dy course of doxycycline. 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 Pt interested in possibly establishing urology care in [place], [place]. -UCx shows klebsiella, sensitive to cipro  -discontinue IV zosyn -start PO cipro -consider imaging if pt spikes another fever for r/o possible abscess   Dispo: If patient tolerates cipro and is afebrile for 24 hours,  likely discharge home tomorrow, given patient's good clinical improvement   The patient does have a current PCP (DEFAULT,PROVIDER, MD), therefore will not be requiring OPC follow-up after discharge.   The patient does not have transportation limitations that hinder transportation to clinic appointments.  .Services Needed at time of discharge: Y = Yes, Blank = No PT:   OT:   RN:   Equipment:   Other:     LOS: 3 days   [name] 10/01/2012, 1:22 PM [phone]

## 2012-10-02 LAB — CBC
HCT: 42.2 % (ref 39.0–52.0)
Hemoglobin: 14.5 g/dL (ref 13.0–17.0)
MCH: 29.3 pg (ref 26.0–34.0)
MCHC: 34.4 g/dL (ref 30.0–36.0)
MCV: 85.3 fL (ref 78.0–100.0)
Platelets: 265 10*3/uL (ref 150–400)
RBC: 4.95 MIL/uL (ref 4.22–5.81)
RDW: 13.3 % (ref 11.5–15.5)
WBC: 6.5 10*3/uL (ref 4.0–10.5)

## 2012-10-02 LAB — BASIC METABOLIC PANEL
BUN: 6 mg/dL (ref 6–23)
CO2: 26 mEq/L (ref 19–32)
Calcium: 9.1 mg/dL (ref 8.4–10.5)
Chloride: 104 mEq/L (ref 96–112)
Creatinine, Ser: 1.01 mg/dL (ref 0.50–1.35)
GFR calc Af Amer: 88 mL/min — ABNORMAL LOW (ref >90)
GFR calc non Af Amer: 76 mL/min — ABNORMAL LOW (ref >90)
Glucose, Bld: 134 mg/dL — ABNORMAL HIGH (ref 70–99)
Potassium: 3.8 mEq/L (ref 3.5–5.1)
Sodium: 140 mEq/L (ref 135–145)

## 2012-10-02 NOTE — Progress Notes (Signed)
Subjective: Overnight, patient had a fever to 100.9, which resolved with tylenol.  The patient feels back to baseline.  No lightheadedness, no dysuria.  Objective: Vital signs in last 24 hours: Filed Vitals:   10/01/12 1840 10/01/12 2100 10/02/12 0300 10/02/12 0500  BP:  128/72  138/76  Pulse:  76  94  Temp: 100.9 F (38.3 C) 98.8 F (37.1 C) 98.6 F (37 C) 98.2 F (36.8 C)  TempSrc: Oral Oral  Oral  Resp:  18  18  Height:      Weight:    234 lb (106.142 kg)  SpO2:  97%  99%   Weight change:   Intake/Output Summary (Last 24 hours) at 10/02/12 1212 Last data filed at 10/02/12 1610  Gross per 24 hour  Intake    960 ml  Output    400 ml  Net    560 ml   General: resting in bed HEENT: PERRL, EOMI, no scleral icterus Cardiac: RRR, no rubs, murmurs or gallops Pulm: clear to auscultation bilaterally, moving normal volumes of air Abd: soft, nontender, nondistended, BS present Ext: warm and well perfused, no pedal edema Neuro: alert and oriented X3, cranial nerves II-XII grossly intact   Lab Results: Basic Metabolic Panel:  Lab 10/02/12 9604 09/30/12 0553  NA 140 139  K 3.8 3.7  CL 104 105  CO2 26 23  GLUCOSE 134* 118*  BUN 6 9  CREATININE 1.01 0.97  CALCIUM 9.1 8.6  MG -- --  PHOS -- --   Liver Function Tests:  Lab 09/30/12 0553  AST 24  ALT 17  ALKPHOS 69  BILITOT 0.9  PROT 6.4  ALBUMIN 2.6*   CBC:  Lab 10/02/12 0625 09/30/12 0553 09/28/12 1932  WBC 6.5 13.1* --  NEUTROABS -- 9.1* 15.2*  HGB 14.5 13.9 --  HCT 42.2 40.4 --  MCV 85.3 85.6 --  PLT 265 214 --   Urinalysis:  Lab 09/28/12 2343  COLORURINE AMBER*  LABSPEC 1.021  PHURINE 5.5  GLUCOSEU NEGATIVE  HGBUR LARGE*  BILIRUBINUR NEGATIVE  KETONESUR 15*  PROTEINUR NEGATIVE  UROBILINOGEN 1.0  NITRITE NEGATIVE  LEUKOCYTESUR MODERATE*   Micro Results: Recent Results (from the past 240 hour(s))  URINE CULTURE     Status: Normal   Collection Time   09/28/12 11:43 PM      Component Value  Range Status Comment   Specimen Description URINE, CLEAN CATCH   Final    Special Requests NONE   Final    Culture  Setup Time 09/29/2012 00:22   Final    Colony Count >=100,000 COLONIES/ML   Final    Culture KLEBSIELLA PNEUMONIAE   Final    Report Status 10/01/2012 FINAL   Final    Organism ID, Bacteria KLEBSIELLA PNEUMONIAE   Final   CULTURE, BLOOD (ROUTINE X 2)     Status: Normal (Preliminary result)   Collection Time   09/30/12  8:15 PM      Component Value Range Status Comment   Specimen Description BLOOD RIGHT HAND   Final    Special Requests BOTTLES DRAWN AEROBIC AND ANAEROBIC 10CC   Final    Culture  Setup Time 10/01/2012 00:54   Final    Culture     Final    Value:        BLOOD CULTURE RECEIVED NO GROWTH TO DATE CULTURE WILL BE HELD FOR 5 DAYS BEFORE ISSUING A FINAL NEGATIVE REPORT   Report Status PENDING   Incomplete   CULTURE,  BLOOD (ROUTINE X 2)     Status: Normal (Preliminary result)   Collection Time   09/30/12  8:20 PM      Component Value Range Status Comment   Specimen Description BLOOD RIGHT ARM   Final    Special Requests BOTTLES DRAWN AEROBIC AND ANAEROBIC 10CC   Final    Culture  Setup Time 10/01/2012 00:54   Final    Culture     Final    Value:        BLOOD CULTURE RECEIVED NO GROWTH TO DATE CULTURE WILL BE HELD FOR 5 DAYS BEFORE ISSUING A FINAL NEGATIVE REPORT   Report Status PENDING   Incomplete    Studies/Results: No results found. Medications: I have reviewed the patient's current medications. Scheduled Meds:    . aspirin  81 mg Oral Daily  . ciprofloxacin  500 mg Oral BID  . enoxaparin (LOVENOX) injection  40 mg Subcutaneous Q24H  . losartan  50 mg Oral Daily  . simvastatin  20 mg Oral q1800  . [DISCONTINUED] piperacillin-tazobactam (ZOSYN)  IV  3.375 g Intravenous Q8H   Continuous Infusions:    . [DISCONTINUED] sodium chloride 150 mL/hr at 09/30/12 1230   PRN Meds:.acetaminophen, acetaminophen, ondansetron (ZOFRAN) IV,  ondansetron Assessment/Plan: 66 yo AA man pmh CVA, HTN, HLD, previous recurrent UTI s/p long catherterizations currently not w/indwelling foley p/w fever found to have pyelonephritis.   1. Pyelonephritis: Pt presented with UA concerning for UTI, and was found to have pyelonephritis. Pt has a long standing hx of recurrent UTIs, most recently 6 wks ago, and competed recent 14 dy course of doxycycline. 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 Pt interested in possibly establishing urology care in [place], [place].  Patient continues to have nightly fevers, but they seem to be decreasing in severity. -UCx shows klebsiella, sensitive to cipro  -started cipro 12/7, continuing cipro now -re-sent urine culture given nightly fevers.   Dispo: If patient tolerates cipro and is afebrile for 24 hours, likely discharge home tomorrow, given patient's good clinical improvement   The patient does have a current PCP (DEFAULT,PROVIDER, MD), therefore but requests OPC follow-up after discharge.   The patient does not have transportation limitations that hinder transportation to clinic appointments.  .Services Needed at time of discharge: Y = Yes, Blank = No PT:   OT:   RN:   Equipment:   Other:     LOS: 4 days   [name] 10/02/2012, 12:12 PM [phone]

## 2012-10-03 LAB — URINE CULTURE
Colony Count: NO GROWTH
Culture: NO GROWTH

## 2012-10-03 MED ORDER — CIPROFLOXACIN HCL 500 MG PO TABS: 500.0000 mg | ORAL_TABLET | Freq: Two times a day (BID) | ORAL | Status: AC

## 2012-10-03 MED ORDER — CIPROFLOXACIN HCL 500 MG PO TABS: 500.0000 mg | ORAL_TABLET | Freq: Two times a day (BID) | ORAL | Status: DC

## 2012-10-03 NOTE — Progress Notes (Signed)
Subjective: Pt had no acute events overnight. VSS and was afebrile. Pt feels back to baseline and tolerated an advanced diet w/o n/v/d. Pt was also able to ambulate w/o concern.   Objective: Vital signs in last 24 hours: Filed Vitals:   10/02/12 0500 10/02/12 1332 10/02/12 2100 10/03/12 0636  BP: 138/76 129/74 139/82 135/82  Pulse: 94 84 82 78  Temp: 98.2 F (36.8 C) 99 F (37.2 C) 99.4 F (37.4 C) 98.6 F (37 C)  TempSrc: Oral  Oral Oral  Resp: 18  18 18   Height:      Weight: 234 lb (106.142 kg)   231 lb 14.8 oz (105.2 kg)  SpO2: 99% 97% 96% 97%   Weight change: -2 lb 1.2 oz (-0.942 kg)  Intake/Output Summary (Last 24 hours) at 10/03/12 0830 Last data filed at 10/03/12 0600  Gross per 24 hour  Intake   1260 ml  Output   1400 ml  Net   -140 ml   General: sitting up in chair, comfortable, NAD HEENT: PERRL, EOMI, no scleral icterus Cardiac: RRR, no rubs, murmurs or gallops Pulm: clear to auscultation bilaterally, moving normal volumes of air Abd: soft, nontender, nondistended, BS present Ext: warm and well perfused, no pedal edema Neuro: alert and oriented X3, cranial nerves II-XII grossly intact   Lab Results: Basic Metabolic Panel:  Lab 10/02/12 1610 09/30/12 0553  NA 140 139  K 3.8 3.7  CL 104 105  CO2 26 23  GLUCOSE 134* 118*  BUN 6 9  CREATININE 1.01 0.97  CALCIUM 9.1 8.6  MG -- --  PHOS -- --   Liver Function Tests:  Lab 09/30/12 0553  AST 24  ALT 17  ALKPHOS 69  BILITOT 0.9  PROT 6.4  ALBUMIN 2.6*   CBC:  Lab 10/02/12 0625 09/30/12 0553 09/28/12 1932  WBC 6.5 13.1* --  NEUTROABS -- 9.1* 15.2*  HGB 14.5 13.9 --  HCT 42.2 40.4 --  MCV 85.3 85.6 --  PLT 265 214 --   Urinalysis:  Lab 09/28/12 2343  COLORURINE AMBER*  LABSPEC 1.021  PHURINE 5.5  GLUCOSEU NEGATIVE  HGBUR LARGE*  BILIRUBINUR NEGATIVE  KETONESUR 15*  PROTEINUR NEGATIVE  UROBILINOGEN 1.0  NITRITE NEGATIVE  LEUKOCYTESUR MODERATE*   Micro Results: Recent Results (from  the past 240 hour(s))  URINE CULTURE     Status: Normal   Collection Time   09/28/12 11:43 PM      Component Value Range Status Comment   Specimen Description URINE, CLEAN CATCH   Final    Special Requests NONE   Final    Culture  Setup Time 09/29/2012 00:22   Final    Colony Count >=100,000 COLONIES/ML   Final    Culture KLEBSIELLA PNEUMONIAE   Final    Report Status 10/01/2012 FINAL   Final    Organism ID, Bacteria KLEBSIELLA PNEUMONIAE   Final   CULTURE, BLOOD (ROUTINE X 2)     Status: Normal (Preliminary result)   Collection Time   09/30/12  8:15 PM      Component Value Range Status Comment   Specimen Description BLOOD RIGHT HAND   Final    Special Requests BOTTLES DRAWN AEROBIC AND ANAEROBIC 10CC   Final    Culture  Setup Time 10/01/2012 00:54   Final    Culture     Final    Value:        BLOOD CULTURE RECEIVED NO GROWTH TO DATE CULTURE WILL BE HELD FOR 5  DAYS BEFORE ISSUING A FINAL NEGATIVE REPORT   Report Status PENDING   Incomplete   CULTURE, BLOOD (ROUTINE X 2)     Status: Normal (Preliminary result)   Collection Time   09/30/12  8:20 PM      Component Value Range Status Comment   Specimen Description BLOOD RIGHT ARM   Final    Special Requests BOTTLES DRAWN AEROBIC AND ANAEROBIC 10CC   Final    Culture  Setup Time 10/01/2012 00:54   Final    Culture     Final    Value:        BLOOD CULTURE RECEIVED NO GROWTH TO DATE CULTURE WILL BE HELD FOR 5 DAYS BEFORE ISSUING A FINAL NEGATIVE REPORT   Report Status PENDING   Incomplete    Studies/Results: No results found. Medications: I have reviewed the patient's current medications. Scheduled Meds:    . aspirin  81 mg Oral Daily  . ciprofloxacin  500 mg Oral BID  . enoxaparin (LOVENOX) injection  40 mg Subcutaneous Q24H  . losartan  50 mg Oral Daily  . simvastatin  20 mg Oral q1800   Continuous Infusions:   PRN Meds:.acetaminophen, acetaminophen, ondansetron (ZOFRAN) IV, ondansetron Assessment/Plan: 66 yo AA man pmh CVA,  HTN, HLD, previous recurrent UTI s/p long catherterizations currently not w/indwelling foley p/w fever found to have pyelonephritis.   1. Pyelonephritis: Pt presented with UA concerning for UTI, and was found to have pyelonephritis. Pt has a long standing hx of recurrent UTIs, most recently 6 wks ago, and competed recent 14 dy course of doxycycline. 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 Pt interested in possibly establishing urology care in [place], [place].  -UCx shows klebsiella, sensitive to cipro  -started cipro 12/7, continuing cipro now for 14 day course -re-sent urine culture NGTD   Dispo: If patient tolerates cipro and is afebrile for 24 hours, likely discharge home tomorrow, given patient's good clinical improvement   The patient does have a current PCP (DEFAULT,PROVIDER, MD), therefore but requests OPC follow-up after discharge.   The patient does not have transportation limitations that hinder transportation to clinic appointments.  .Services Needed at time of discharge: Y = Yes, Blank = No PT:   OT:   RN:   Equipment:   Other:     LOS: 5 days   Christen Bame 10/03/2012, 8:30 AM [phone]

## 2012-10-03 NOTE — Discharge Summary (Signed)
Internal Medicine Teaching [facility] Discharge Note  Name: Robert [name] MRN: [id number] DOB: 1946-03-03 66 y.o.  Date of Admission: 09/28/2012  7:00 PM Date of Discharge: 10/03/2012 Attending Physician: Burns Spain, MD  Discharge Diagnosis: Principal Problem:  *Acute pyelonephritis Active Problems:  Nausea  Fever  HTN (hypertension)  H/O: CVA (cerebrovascular accident)  Hearing loss   Discharge Medications:   Medication List     As of 10/03/2012  8:31 AM    TAKE these medications         aspirin 81 MG chewable tablet   Chew 81 mg by mouth daily.      ciprofloxacin 500 MG tablet   Commonly known as: CIPRO   Take 1 tablet (500 mg total) by mouth 2 (two) times daily.      losartan 100 MG tablet   Commonly known as: COZAAR   Take 50 mg by mouth daily.      PRESCRIPTION MEDICATION   Take 1 tablet by mouth daily. For cholesterol        Disposition and follow-up:   [name] was [facility] in good condition.  At the hospital follow up visit please address completion of Ciprofloxacin, urology referral, and establishing PCP care.   Follow-up Appointments:    Procedures Performed:  Dg Chest Port 1 View  09/29/2012  *RADIOLOGY REPORT*  Clinical Data: Fever.  PORTABLE CHEST - 1 VIEW  Comparison: None.  Findings: Heart size is normal.  Both lungs are clear.  No evidence of pleural effusion.  IMPRESSION: No acute findings.   Original Report Authenticated By: Myles Rosenthal, M.D.    Admission HPI: Patient is a pleasant 58 year man with past history of CVA, hypertension, hyper lipidemia who comes to the hospital with chief complaint of fever since yesterday. He is accompanied by his wife who provides most of the history. Patient started having fever yesterday- 103-continuous not resolving. Associated with urinary frequency, loss of appetite for one day, some malaise, confusion. Patient does not report any cough, chest pain,  abdominal pain, back pain, Burning micturition, diarrhea, headache, neck pain. 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 Last UTI was about 6 weeks before- was diagnosed at urology clinic in [place] and was given antibiotics for a week- unclear which.  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 Patient followed by a [facility]rologist at [facility].   Hospital Course by problem list:  1. Acute Pyelonephritis: Pt initially met SIRS criteria and UA concerning for UTI found to have pyelonephritis. UCx showed Klebsiella sensitive to Cipro. Pt continued to spike 101.8 fevers nightly throughout admission but was clinically improving. Upon d/c pt was afebrile and stable. Pt long standing hx of recurrent UTIs last 6 wks ago and competed recent 14 dy course of doxycycline. 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 Pt interested in possibly establishing urology care in [place], [place]. Pt was d/c on total of 14 day course of Cipro.   Discharge Vitals:  BP 135/82  Pulse 78  Temp 98.6 F (37 C) (Oral)  Resp 18  Ht 6\' 1"  (1.854 m)  Wt 231 lb 14.8 oz (105.2 kg)  BMI 30.60 kg/m2  SpO2 97% General: sitting up in chair, comfortable,  NAD HEENT: PERRL, EOMI, no scleral icterus  Cardiac: RRR, no rubs, murmurs or gallops  Pulm: clear to auscultation bilaterally, moving normal volumes of air  Abd: soft, nontender, nondistended, BS present  Ext: warm and well perfused, no pedal edema  Neuro: alert and oriented X3, cranial nerves II-XII grossly intact  Discharge Labs: No results found for this or any previous visit (from the past 24 hour(s)).  Signed: Christen Bame 10/03/2012, 8:31 AM   Time Spent on Discharge: 25 min Services Ordered on Discharge: none Equipment Ordered on  Discharge: none

## 2012-10-07 LAB — CULTURE, BLOOD (ROUTINE X 2)
Culture: NO GROWTH
Culture: NO GROWTH

## 2012-11-03 DIAGNOSIS — L03039 Cellulitis of unspecified toe: Secondary | ICD-10-CM | POA: Diagnosis not present

## 2012-11-08 DIAGNOSIS — R3129 Other microscopic hematuria: Secondary | ICD-10-CM | POA: Diagnosis not present

## 2012-11-08 DIAGNOSIS — N135 Crossing vessel and stricture of ureter without hydronephrosis: Secondary | ICD-10-CM | POA: Diagnosis not present

## 2012-11-08 DIAGNOSIS — N3020 Other chronic cystitis without hematuria: Secondary | ICD-10-CM | POA: Diagnosis not present

## 2012-11-08 DIAGNOSIS — N200 Calculus of kidney: Secondary | ICD-10-CM | POA: Diagnosis not present

## 2012-11-29 DIAGNOSIS — N12 Tubulo-interstitial nephritis, not specified as acute or chronic: Secondary | ICD-10-CM | POA: Diagnosis not present

## 2012-11-29 DIAGNOSIS — N3090 Cystitis, unspecified without hematuria: Secondary | ICD-10-CM | POA: Diagnosis not present

## 2012-11-29 DIAGNOSIS — R3129 Other microscopic hematuria: Secondary | ICD-10-CM | POA: Diagnosis not present

## 2012-12-02 DIAGNOSIS — N135 Crossing vessel and stricture of ureter without hydronephrosis: Secondary | ICD-10-CM | POA: Diagnosis not present

## 2012-12-02 DIAGNOSIS — N200 Calculus of kidney: Secondary | ICD-10-CM | POA: Diagnosis not present

## 2012-12-02 DIAGNOSIS — R3129 Other microscopic hematuria: Secondary | ICD-10-CM | POA: Diagnosis not present

## 2012-12-02 DIAGNOSIS — N3020 Other chronic cystitis without hematuria: Secondary | ICD-10-CM | POA: Diagnosis not present

## 2013-01-05 DIAGNOSIS — M79609 Pain in unspecified limb: Secondary | ICD-10-CM | POA: Diagnosis not present

## 2013-01-05 DIAGNOSIS — B351 Tinea unguium: Secondary | ICD-10-CM | POA: Diagnosis not present

## 2013-01-16 DIAGNOSIS — J069 Acute upper respiratory infection, unspecified: Secondary | ICD-10-CM | POA: Diagnosis not present

## 2013-01-16 DIAGNOSIS — J029 Acute pharyngitis, unspecified: Secondary | ICD-10-CM | POA: Diagnosis not present

## 2013-01-25 DIAGNOSIS — N3020 Other chronic cystitis without hematuria: Secondary | ICD-10-CM | POA: Diagnosis not present

## 2013-02-28 DIAGNOSIS — M545 Low back pain, unspecified: Secondary | ICD-10-CM | POA: Diagnosis not present

## 2013-02-28 DIAGNOSIS — I69959 Hemiplegia and hemiparesis following unspecified cerebrovascular disease affecting unspecified side: Secondary | ICD-10-CM | POA: Diagnosis not present

## 2013-03-06 DIAGNOSIS — M25559 Pain in unspecified hip: Secondary | ICD-10-CM | POA: Diagnosis not present

## 2013-03-06 DIAGNOSIS — Z5189 Encounter for other specified aftercare: Secondary | ICD-10-CM | POA: Diagnosis not present

## 2013-03-06 DIAGNOSIS — M545 Low back pain, unspecified: Secondary | ICD-10-CM | POA: Diagnosis not present

## 2013-03-06 DIAGNOSIS — I69959 Hemiplegia and hemiparesis following unspecified cerebrovascular disease affecting unspecified side: Secondary | ICD-10-CM | POA: Diagnosis not present

## 2013-03-06 DIAGNOSIS — I10 Essential (primary) hypertension: Secondary | ICD-10-CM | POA: Diagnosis not present

## 2013-03-08 DIAGNOSIS — M25559 Pain in unspecified hip: Secondary | ICD-10-CM | POA: Diagnosis not present

## 2013-03-08 DIAGNOSIS — I69959 Hemiplegia and hemiparesis following unspecified cerebrovascular disease affecting unspecified side: Secondary | ICD-10-CM | POA: Diagnosis not present

## 2013-03-08 DIAGNOSIS — I10 Essential (primary) hypertension: Secondary | ICD-10-CM | POA: Diagnosis not present

## 2013-03-08 DIAGNOSIS — M545 Low back pain, unspecified: Secondary | ICD-10-CM | POA: Diagnosis not present

## 2013-03-08 DIAGNOSIS — Z5189 Encounter for other specified aftercare: Secondary | ICD-10-CM | POA: Diagnosis not present

## 2013-03-09 DIAGNOSIS — B351 Tinea unguium: Secondary | ICD-10-CM | POA: Diagnosis not present

## 2013-03-09 DIAGNOSIS — Z5189 Encounter for other specified aftercare: Secondary | ICD-10-CM | POA: Diagnosis not present

## 2013-03-09 DIAGNOSIS — I10 Essential (primary) hypertension: Secondary | ICD-10-CM | POA: Diagnosis not present

## 2013-03-09 DIAGNOSIS — M25559 Pain in unspecified hip: Secondary | ICD-10-CM | POA: Diagnosis not present

## 2013-03-09 DIAGNOSIS — M545 Low back pain, unspecified: Secondary | ICD-10-CM | POA: Diagnosis not present

## 2013-03-09 DIAGNOSIS — M79609 Pain in unspecified limb: Secondary | ICD-10-CM | POA: Diagnosis not present

## 2013-03-09 DIAGNOSIS — I69959 Hemiplegia and hemiparesis following unspecified cerebrovascular disease affecting unspecified side: Secondary | ICD-10-CM | POA: Diagnosis not present

## 2013-03-10 DIAGNOSIS — M545 Low back pain, unspecified: Secondary | ICD-10-CM | POA: Diagnosis not present

## 2013-03-10 DIAGNOSIS — I69959 Hemiplegia and hemiparesis following unspecified cerebrovascular disease affecting unspecified side: Secondary | ICD-10-CM | POA: Diagnosis not present

## 2013-03-10 DIAGNOSIS — M25559 Pain in unspecified hip: Secondary | ICD-10-CM | POA: Diagnosis not present

## 2013-03-10 DIAGNOSIS — I10 Essential (primary) hypertension: Secondary | ICD-10-CM | POA: Diagnosis not present

## 2013-03-10 DIAGNOSIS — Z5189 Encounter for other specified aftercare: Secondary | ICD-10-CM | POA: Diagnosis not present

## 2013-03-13 DIAGNOSIS — I10 Essential (primary) hypertension: Secondary | ICD-10-CM | POA: Diagnosis not present

## 2013-03-13 DIAGNOSIS — M25559 Pain in unspecified hip: Secondary | ICD-10-CM | POA: Diagnosis not present

## 2013-03-13 DIAGNOSIS — Z5189 Encounter for other specified aftercare: Secondary | ICD-10-CM | POA: Diagnosis not present

## 2013-03-13 DIAGNOSIS — M545 Low back pain, unspecified: Secondary | ICD-10-CM | POA: Diagnosis not present

## 2013-03-13 DIAGNOSIS — I69959 Hemiplegia and hemiparesis following unspecified cerebrovascular disease affecting unspecified side: Secondary | ICD-10-CM | POA: Diagnosis not present

## 2013-03-14 DIAGNOSIS — Z5189 Encounter for other specified aftercare: Secondary | ICD-10-CM | POA: Diagnosis not present

## 2013-03-14 DIAGNOSIS — I10 Essential (primary) hypertension: Secondary | ICD-10-CM | POA: Diagnosis not present

## 2013-03-14 DIAGNOSIS — M545 Low back pain, unspecified: Secondary | ICD-10-CM | POA: Diagnosis not present

## 2013-03-14 DIAGNOSIS — M25559 Pain in unspecified hip: Secondary | ICD-10-CM | POA: Diagnosis not present

## 2013-03-14 DIAGNOSIS — I69959 Hemiplegia and hemiparesis following unspecified cerebrovascular disease affecting unspecified side: Secondary | ICD-10-CM | POA: Diagnosis not present

## 2013-03-17 DIAGNOSIS — M545 Low back pain, unspecified: Secondary | ICD-10-CM | POA: Diagnosis not present

## 2013-03-17 DIAGNOSIS — Z5189 Encounter for other specified aftercare: Secondary | ICD-10-CM | POA: Diagnosis not present

## 2013-03-17 DIAGNOSIS — I69959 Hemiplegia and hemiparesis following unspecified cerebrovascular disease affecting unspecified side: Secondary | ICD-10-CM | POA: Diagnosis not present

## 2013-03-17 DIAGNOSIS — M25559 Pain in unspecified hip: Secondary | ICD-10-CM | POA: Diagnosis not present

## 2013-03-17 DIAGNOSIS — I10 Essential (primary) hypertension: Secondary | ICD-10-CM | POA: Diagnosis not present

## 2013-03-20 DIAGNOSIS — M25559 Pain in unspecified hip: Secondary | ICD-10-CM | POA: Diagnosis not present

## 2013-03-20 DIAGNOSIS — M545 Low back pain, unspecified: Secondary | ICD-10-CM | POA: Diagnosis not present

## 2013-03-20 DIAGNOSIS — I69959 Hemiplegia and hemiparesis following unspecified cerebrovascular disease affecting unspecified side: Secondary | ICD-10-CM | POA: Diagnosis not present

## 2013-03-20 DIAGNOSIS — I10 Essential (primary) hypertension: Secondary | ICD-10-CM | POA: Diagnosis not present

## 2013-03-20 DIAGNOSIS — Z5189 Encounter for other specified aftercare: Secondary | ICD-10-CM | POA: Diagnosis not present

## 2013-03-21 DIAGNOSIS — I69959 Hemiplegia and hemiparesis following unspecified cerebrovascular disease affecting unspecified side: Secondary | ICD-10-CM | POA: Diagnosis not present

## 2013-03-21 DIAGNOSIS — M545 Low back pain, unspecified: Secondary | ICD-10-CM | POA: Diagnosis not present

## 2013-03-21 DIAGNOSIS — M25559 Pain in unspecified hip: Secondary | ICD-10-CM | POA: Diagnosis not present

## 2013-03-21 DIAGNOSIS — I10 Essential (primary) hypertension: Secondary | ICD-10-CM | POA: Diagnosis not present

## 2013-03-21 DIAGNOSIS — Z5189 Encounter for other specified aftercare: Secondary | ICD-10-CM | POA: Diagnosis not present

## 2013-03-23 DIAGNOSIS — M545 Low back pain, unspecified: Secondary | ICD-10-CM | POA: Diagnosis not present

## 2013-03-23 DIAGNOSIS — Z5189 Encounter for other specified aftercare: Secondary | ICD-10-CM | POA: Diagnosis not present

## 2013-03-23 DIAGNOSIS — I69959 Hemiplegia and hemiparesis following unspecified cerebrovascular disease affecting unspecified side: Secondary | ICD-10-CM | POA: Diagnosis not present

## 2013-03-23 DIAGNOSIS — I10 Essential (primary) hypertension: Secondary | ICD-10-CM | POA: Diagnosis not present

## 2013-03-23 DIAGNOSIS — M25559 Pain in unspecified hip: Secondary | ICD-10-CM | POA: Diagnosis not present

## 2013-03-27 DIAGNOSIS — M25559 Pain in unspecified hip: Secondary | ICD-10-CM | POA: Diagnosis not present

## 2013-03-27 DIAGNOSIS — I69959 Hemiplegia and hemiparesis following unspecified cerebrovascular disease affecting unspecified side: Secondary | ICD-10-CM | POA: Diagnosis not present

## 2013-03-27 DIAGNOSIS — M545 Low back pain, unspecified: Secondary | ICD-10-CM | POA: Diagnosis not present

## 2013-03-27 DIAGNOSIS — Z5189 Encounter for other specified aftercare: Secondary | ICD-10-CM | POA: Diagnosis not present

## 2013-03-27 DIAGNOSIS — I10 Essential (primary) hypertension: Secondary | ICD-10-CM | POA: Diagnosis not present

## 2013-03-28 DIAGNOSIS — M545 Low back pain, unspecified: Secondary | ICD-10-CM | POA: Diagnosis not present

## 2013-03-28 DIAGNOSIS — M25559 Pain in unspecified hip: Secondary | ICD-10-CM | POA: Diagnosis not present

## 2013-03-28 DIAGNOSIS — I10 Essential (primary) hypertension: Secondary | ICD-10-CM | POA: Diagnosis not present

## 2013-03-28 DIAGNOSIS — I69959 Hemiplegia and hemiparesis following unspecified cerebrovascular disease affecting unspecified side: Secondary | ICD-10-CM | POA: Diagnosis not present

## 2013-03-28 DIAGNOSIS — Z5189 Encounter for other specified aftercare: Secondary | ICD-10-CM | POA: Diagnosis not present

## 2013-04-05 DIAGNOSIS — I69959 Hemiplegia and hemiparesis following unspecified cerebrovascular disease affecting unspecified side: Secondary | ICD-10-CM | POA: Diagnosis not present

## 2013-04-05 DIAGNOSIS — M545 Low back pain, unspecified: Secondary | ICD-10-CM | POA: Diagnosis not present

## 2013-04-05 DIAGNOSIS — I10 Essential (primary) hypertension: Secondary | ICD-10-CM | POA: Diagnosis not present

## 2013-04-05 DIAGNOSIS — M25559 Pain in unspecified hip: Secondary | ICD-10-CM | POA: Diagnosis not present

## 2013-04-05 DIAGNOSIS — Z5189 Encounter for other specified aftercare: Secondary | ICD-10-CM | POA: Diagnosis not present

## 2013-04-06 DIAGNOSIS — N390 Urinary tract infection, site not specified: Secondary | ICD-10-CM | POA: Diagnosis not present

## 2013-04-12 DIAGNOSIS — I69959 Hemiplegia and hemiparesis following unspecified cerebrovascular disease affecting unspecified side: Secondary | ICD-10-CM | POA: Diagnosis not present

## 2013-04-12 DIAGNOSIS — Z5189 Encounter for other specified aftercare: Secondary | ICD-10-CM | POA: Diagnosis not present

## 2013-04-12 DIAGNOSIS — M545 Low back pain, unspecified: Secondary | ICD-10-CM | POA: Diagnosis not present

## 2013-04-12 DIAGNOSIS — M25559 Pain in unspecified hip: Secondary | ICD-10-CM | POA: Diagnosis not present

## 2013-04-12 DIAGNOSIS — I10 Essential (primary) hypertension: Secondary | ICD-10-CM | POA: Diagnosis not present

## 2013-06-01 DIAGNOSIS — B351 Tinea unguium: Secondary | ICD-10-CM | POA: Diagnosis not present

## 2013-06-01 DIAGNOSIS — M79609 Pain in unspecified limb: Secondary | ICD-10-CM | POA: Diagnosis not present

## 2013-06-08 DIAGNOSIS — N3020 Other chronic cystitis without hematuria: Secondary | ICD-10-CM | POA: Diagnosis not present

## 2013-06-08 DIAGNOSIS — N135 Crossing vessel and stricture of ureter without hydronephrosis: Secondary | ICD-10-CM | POA: Diagnosis not present

## 2013-06-22 DIAGNOSIS — E785 Hyperlipidemia, unspecified: Secondary | ICD-10-CM | POA: Diagnosis not present

## 2013-06-22 DIAGNOSIS — I69959 Hemiplegia and hemiparesis following unspecified cerebrovascular disease affecting unspecified side: Secondary | ICD-10-CM | POA: Diagnosis not present

## 2013-06-22 DIAGNOSIS — Z0000 Encounter for general adult medical examination without abnormal findings: Secondary | ICD-10-CM | POA: Diagnosis not present

## 2013-06-22 DIAGNOSIS — Z1211 Encounter for screening for malignant neoplasm of colon: Secondary | ICD-10-CM | POA: Diagnosis not present

## 2013-06-22 DIAGNOSIS — Z125 Encounter for screening for malignant neoplasm of prostate: Secondary | ICD-10-CM | POA: Diagnosis not present

## 2013-06-22 DIAGNOSIS — Z23 Encounter for immunization: Secondary | ICD-10-CM | POA: Diagnosis not present

## 2013-06-22 DIAGNOSIS — I10 Essential (primary) hypertension: Secondary | ICD-10-CM | POA: Diagnosis not present

## 2013-07-16 ENCOUNTER — Encounter (HOSPITAL_COMMUNITY): Payer: Self-pay | Admitting: Emergency Medicine

## 2013-07-16 ENCOUNTER — Emergency Department (HOSPITAL_COMMUNITY)
Admission: EM | Admit: 2013-07-16 | Discharge: 2013-07-16 | Disposition: A | Discharge status: Home / Self Care | Payer: Medicare Other | Attending: Emergency Medicine | Admitting: Emergency Medicine

## 2013-07-16 DIAGNOSIS — Z79899 Other long term (current) drug therapy: Secondary | ICD-10-CM | POA: Insufficient documentation

## 2013-07-16 DIAGNOSIS — N489 Disorder of penis, unspecified: Secondary | ICD-10-CM | POA: Diagnosis not present

## 2013-07-16 DIAGNOSIS — Z87891 Personal history of nicotine dependence: Secondary | ICD-10-CM | POA: Insufficient documentation

## 2013-07-16 DIAGNOSIS — E7800 Pure hypercholesterolemia, unspecified: Secondary | ICD-10-CM | POA: Diagnosis not present

## 2013-07-16 DIAGNOSIS — Z8719 Personal history of other diseases of the digestive system: Secondary | ICD-10-CM | POA: Insufficient documentation

## 2013-07-16 DIAGNOSIS — Z8673 Personal history of transient ischemic attack (TIA), and cerebral infarction without residual deficits: Secondary | ICD-10-CM | POA: Insufficient documentation

## 2013-07-16 DIAGNOSIS — I10 Essential (primary) hypertension: Secondary | ICD-10-CM | POA: Diagnosis not present

## 2013-07-16 DIAGNOSIS — Z7982 Long term (current) use of aspirin: Secondary | ICD-10-CM | POA: Insufficient documentation

## 2013-07-16 DIAGNOSIS — R300 Dysuria: Secondary | ICD-10-CM | POA: Insufficient documentation

## 2013-07-16 DIAGNOSIS — R3989 Other symptoms and signs involving the genitourinary system: Secondary | ICD-10-CM | POA: Diagnosis not present

## 2013-07-16 DIAGNOSIS — R319 Hematuria, unspecified: Secondary | ICD-10-CM

## 2013-07-16 DIAGNOSIS — R6889 Other general symptoms and signs: Secondary | ICD-10-CM | POA: Diagnosis not present

## 2013-07-16 LAB — URINALYSIS, ROUTINE W REFLEX MICROSCOPIC
Bilirubin Urine: NEGATIVE
Glucose, UA: NEGATIVE mg/dL
Ketones, ur: NEGATIVE mg/dL
Nitrite: NEGATIVE
Protein, ur: NEGATIVE mg/dL
Specific Gravity, Urine: 1.023 (ref 1.005–1.030)
Urobilinogen, UA: 1 mg/dL (ref 0.0–1.0)
pH: 6 (ref 5.0–8.0)

## 2013-07-16 LAB — CBC WITH DIFFERENTIAL/PLATELET
Basophils Absolute: 0 10*3/uL (ref 0.0–0.1)
Basophils Relative: 0 % (ref 0–1)
Eosinophils Absolute: 0.2 10*3/uL (ref 0.0–0.7)
Eosinophils Relative: 3 % (ref 0–5)
HCT: 44.8 % (ref 39.0–52.0)
Hemoglobin: 15.4 g/dL (ref 13.0–17.0)
Lymphocytes Relative: 25 % (ref 12–46)
Lymphs Abs: 1.7 10*3/uL (ref 0.7–4.0)
MCH: 30.1 pg (ref 26.0–34.0)
MCHC: 34.4 g/dL (ref 30.0–36.0)
MCV: 87.5 fL (ref 78.0–100.0)
Monocytes Absolute: 0.8 10*3/uL (ref 0.1–1.0)
Monocytes Relative: 11 % (ref 3–12)
Neutro Abs: 4.1 10*3/uL (ref 1.7–7.7)
Neutrophils Relative %: 60 % (ref 43–77)
Platelets: 274 10*3/uL (ref 150–400)
RBC: 5.12 MIL/uL (ref 4.22–5.81)
RDW: 13.4 % (ref 11.5–15.5)
WBC: 6.9 10*3/uL (ref 4.0–10.5)

## 2013-07-16 LAB — BASIC METABOLIC PANEL
BUN: 16 mg/dL (ref 6–23)
CO2: 30 mEq/L (ref 19–32)
Calcium: 9.1 mg/dL (ref 8.4–10.5)
Chloride: 105 mEq/L (ref 96–112)
Creatinine, Ser: 1.02 mg/dL (ref 0.50–1.35)
GFR calc Af Amer: 87 mL/min — ABNORMAL LOW (ref >90)
GFR calc non Af Amer: 75 mL/min — ABNORMAL LOW (ref >90)
Glucose, Bld: 147 mg/dL — ABNORMAL HIGH (ref 70–99)
Potassium: 4.5 mEq/L (ref 3.5–5.1)
Sodium: 141 mEq/L (ref 135–145)

## 2013-07-16 LAB — URINE MICROSCOPIC-ADD ON

## 2013-07-16 MED ORDER — SULFAMETHOXAZOLE-TMP DS 800-160 MG PO TABS
1.0000 | ORAL_TABLET | Freq: Once | ORAL | Status: AC
  Administered 2013-07-16: 1 via ORAL
  Filled 2013-07-16: qty 1

## 2013-07-16 MED ORDER — SULFAMETHOXAZOLE-TRIMETHOPRIM 800-160 MG PO TABS: 1.0000 | ORAL_TABLET | Freq: Two times a day (BID) | ORAL | Status: DC

## 2013-07-16 NOTE — ED Notes (Signed)
Pt c/o hematuria and dysuria starting today; pt sts takes ASA; small amount of red blood noted per EMS

## 2013-07-16 NOTE — ED Provider Notes (Signed)
Medical screening examination/treatment/procedure(s) were performed by non-physician practitioner and as supervising physician I was immediately available for consultation/collaboration.   Junius Argyle, MD 07/16/13 [phone]

## 2013-07-16 NOTE — ED Notes (Signed)
Pt states he noticed some blood in his urine about an hour ago. Pt states urine was bright red in color, and a large amount. Pt denies any pain during urination, or urinary frequency prior to an hour ago but since then has developed some pain with urination. Pt denies any pain at present.

## 2013-07-16 NOTE — ED Notes (Signed)
Discharge instructions reviewed with pt. Pt verbalized understanding.   

## 2013-07-16 NOTE — ED Provider Notes (Signed)
CSN: [id number]     Arrival date & time 07/16/13  1532 History   First MD Initiated Contact with Patient 07/16/13 1553     Chief Complaint  Patient presents with  . Hematuria  . Dysuria   (Consider location/radiation/quality/duration/timing/severity/associated sxs/prior Treatment) Patient is a 67 y.o. male presenting with hematuria and dysuria. The history is provided by the patient. No language interpreter was used.  Hematuria This is a new problem. The current episode started today. Pertinent negatives include no abdominal pain, fever, nausea, vomiting or weakness. Associated symptoms comments: He reports going to the bathroom to urinate today and finding significant bleeding with urine. He states starting a stream was not hard but there was some difficulty in stopping the urinary stream. There was associated penile pain but no testicular pain or scrotal swelling. He denies fever, N, V, flank or abdominal pain. .  Dysuria Pertinent negatives include no abdominal pain, fever, nausea, vomiting or weakness.    Past Medical History  Diagnosis Date  . Stroke   . Hypertension   . Hypercholesteremia   . GERD (gastroesophageal reflux disease)   . Fever 09/29/2012   Past Surgical History  Procedure Laterality Date  . No past surgeries     Family History  Problem Relation Age of Onset  . Diabetes Sister   . Lung cancer Brother     Post 9/11 voluntary work in [employer]   History  Substance Use Topics  . Smoking status: Former Smoker    Quit date: 10/26/1970  . Smokeless tobacco: Never Used  . Alcohol Use: No    Review of Systems  Constitutional: Negative for fever.  Gastrointestinal: Negative for nausea, vomiting and abdominal pain.  Genitourinary: Positive for dysuria, hematuria and difficulty urinating. Negative for flank pain, scrotal swelling and testicular pain.  Neurological: Negative for weakness.  Psychiatric/Behavioral: Negative for confusion.    Allergies  Review of  patient's allergies indicates no known allergies.  Home Medications   Current Outpatient Rx  Name  Route  Sig  Dispense  Refill  . aspirin 81 MG tablet   Oral   Take 81 mg by mouth daily.         Marland Kitchen losartan (COZAAR) 100 MG tablet   Oral   Take 50 mg by mouth daily.         . simvastatin (ZOCOR) 20 MG tablet   Oral   Take 20 mg by mouth every evening.          BP 131/81  Pulse 102  Temp(Src) 98.5 F (36.9 C) (Oral)  Resp 18  SpO2 98% Physical Exam  Constitutional: He is oriented to person, place, and time. He appears well-developed and well-nourished.  Neck: Normal range of motion.  Pulmonary/Chest: Effort normal.  Abdominal: Soft. There is no tenderness. There is no rebound and no guarding.  Genitourinary: Prostate normal. No penile tenderness.  There is a clot at the penile meatus. No swelling or evidence of trauma. No scrotal swelling or testicular tenderness. Prostate is not palpably enlarged and is non-tender.   Musculoskeletal: Normal range of motion.  Neurological: He is alert and oriented to person, place, and time.  Skin: Skin is warm and dry.  Psychiatric: He has a normal mood and affect.    ED Course  Procedures (including critical care time) Labs Review Labs Reviewed  URINE CULTURE  URINALYSIS, ROUTINE W REFLEX MICROSCOPIC  CBC WITH DIFFERENTIAL  BASIC METABOLIC PANEL   Results for orders placed during the hospital encounter  of 07/16/13  URINALYSIS, ROUTINE W REFLEX MICROSCOPIC      Result Value Range   Color, Urine YELLOW  YELLOW   APPearance HAZY (*) CLEAR   Specific Gravity, Urine 1.023  1.005 - 1.030   pH 6.0  5.0 - 8.0   Glucose, UA NEGATIVE  NEGATIVE mg/dL   Hgb urine dipstick LARGE (*) NEGATIVE   Bilirubin Urine NEGATIVE  NEGATIVE   Ketones, ur NEGATIVE  NEGATIVE mg/dL   Protein, ur NEGATIVE  NEGATIVE mg/dL   Urobilinogen, UA 1.0  0.0 - 1.0 mg/dL   Nitrite NEGATIVE  NEGATIVE   Leukocytes, UA SMALL (*) NEGATIVE  CBC WITH  DIFFERENTIAL      Result Value Range   WBC 6.9  4.0 - 10.5 K/uL   RBC 5.12  4.22 - 5.81 MIL/uL   Hemoglobin 15.4  13.0 - 17.0 g/dL   HCT 16.1  09.6 - 04.5 %   MCV 87.5  78.0 - 100.0 fL   MCH 30.1  26.0 - 34.0 pg   MCHC 34.4  30.0 - 36.0 g/dL   RDW 40.9  81.1 - 91.4 %   Platelets 274  150 - 400 K/uL   Neutrophils Relative % 60  43 - 77 %   Neutro Abs 4.1  1.7 - 7.7 K/uL   Lymphocytes Relative 25  12 - 46 %   Lymphs Abs 1.7  0.7 - 4.0 K/uL   Monocytes Relative 11  3 - 12 %   Monocytes Absolute 0.8  0.1 - 1.0 K/uL   Eosinophils Relative 3  0 - 5 %   Eosinophils Absolute 0.2  0.0 - 0.7 K/uL   Basophils Relative 0  0 - 1 %   Basophils Absolute 0.0  0.0 - 0.1 K/uL  BASIC METABOLIC PANEL      Result Value Range   Sodium 141  135 - 145 mEq/L   Potassium 4.5  3.5 - 5.1 mEq/L   Chloride 105  96 - 112 mEq/L   CO2 30  19 - 32 mEq/L   Glucose, Bld 147 (*) 70 - 99 mg/dL   BUN 16  6 - 23 mg/dL   Creatinine, Ser 7.82  0.50 - 1.35 mg/dL   Calcium 9.1  8.4 - 95.6 mg/dL   GFR calc non Af Amer 75 (*) >90 mL/min   GFR calc Af Amer 87 (*) >90 mL/min  URINE MICROSCOPIC-ADD ON      Result Value Range   Squamous Epithelial / LPF FEW (*) RARE   WBC, UA 3-6  <3 WBC/hpf   RBC / HPF TOO NUMEROUS TO COUNT  <3 RBC/hpf    Imaging Review No results found.  MDM  No diagnosis found. 1. Hematuria  Patient continues to urinate without difficulty, still seeing significant bleeding. Continues to be painless hematuria. Discussed with Dr. Sherron Monday who recommends empiric abx therapy (Cipro or Septra), no Foley catheter and follow up with Dr. Isabel Caprice tomorrow in office.     Arnoldo Hooker, PA-C 07/16/13 1933

## 2013-07-17 DIAGNOSIS — N135 Crossing vessel and stricture of ureter without hydronephrosis: Secondary | ICD-10-CM | POA: Diagnosis not present

## 2013-07-17 DIAGNOSIS — N390 Urinary tract infection, site not specified: Secondary | ICD-10-CM | POA: Diagnosis not present

## 2013-07-18 LAB — URINE CULTURE
Colony Count: NO GROWTH
Culture: NO GROWTH

## 2013-08-11 DIAGNOSIS — K6389 Other specified diseases of intestine: Secondary | ICD-10-CM | POA: Diagnosis not present

## 2013-08-11 DIAGNOSIS — K5730 Diverticulosis of large intestine without perforation or abscess without bleeding: Secondary | ICD-10-CM | POA: Diagnosis not present

## 2013-08-11 DIAGNOSIS — Z800 Family history of malignant neoplasm of digestive organs: Secondary | ICD-10-CM | POA: Diagnosis not present

## 2013-08-11 DIAGNOSIS — Z1211 Encounter for screening for malignant neoplasm of colon: Secondary | ICD-10-CM | POA: Diagnosis not present

## 2013-09-27 DIAGNOSIS — N3020 Other chronic cystitis without hematuria: Secondary | ICD-10-CM | POA: Diagnosis not present

## 2014-01-12 DIAGNOSIS — R3129 Other microscopic hematuria: Secondary | ICD-10-CM | POA: Diagnosis not present

## 2014-01-12 DIAGNOSIS — N200 Calculus of kidney: Secondary | ICD-10-CM | POA: Diagnosis not present

## 2014-01-12 DIAGNOSIS — N3020 Other chronic cystitis without hematuria: Secondary | ICD-10-CM | POA: Diagnosis not present

## 2014-01-12 DIAGNOSIS — N135 Crossing vessel and stricture of ureter without hydronephrosis: Secondary | ICD-10-CM | POA: Diagnosis not present

## 2014-01-12 DIAGNOSIS — N401 Enlarged prostate with lower urinary tract symptoms: Secondary | ICD-10-CM | POA: Diagnosis not present

## 2014-01-12 DIAGNOSIS — N139 Obstructive and reflux uropathy, unspecified: Secondary | ICD-10-CM | POA: Diagnosis not present

## 2014-01-12 DIAGNOSIS — N138 Other obstructive and reflux uropathy: Secondary | ICD-10-CM | POA: Diagnosis not present

## 2014-07-24 DIAGNOSIS — Z125 Encounter for screening for malignant neoplasm of prostate: Secondary | ICD-10-CM | POA: Diagnosis not present

## 2014-07-24 DIAGNOSIS — E785 Hyperlipidemia, unspecified: Secondary | ICD-10-CM | POA: Diagnosis not present

## 2014-07-24 DIAGNOSIS — Z23 Encounter for immunization: Secondary | ICD-10-CM | POA: Diagnosis not present

## 2014-07-24 DIAGNOSIS — I69959 Hemiplegia and hemiparesis following unspecified cerebrovascular disease affecting unspecified side: Secondary | ICD-10-CM | POA: Diagnosis not present

## 2014-07-24 DIAGNOSIS — R7309 Other abnormal glucose: Secondary | ICD-10-CM | POA: Diagnosis not present

## 2014-07-24 DIAGNOSIS — I10 Essential (primary) hypertension: Secondary | ICD-10-CM | POA: Diagnosis not present

## 2014-07-26 DIAGNOSIS — N401 Enlarged prostate with lower urinary tract symptoms: Secondary | ICD-10-CM | POA: Diagnosis not present

## 2014-07-26 DIAGNOSIS — N3020 Other chronic cystitis without hematuria: Secondary | ICD-10-CM | POA: Diagnosis not present

## 2014-07-26 DIAGNOSIS — N135 Crossing vessel and stricture of ureter without hydronephrosis: Secondary | ICD-10-CM | POA: Diagnosis not present

## 2015-01-28 DIAGNOSIS — M79672 Pain in left foot: Secondary | ICD-10-CM | POA: Diagnosis not present

## 2015-01-28 DIAGNOSIS — W19XXXA Unspecified fall, initial encounter: Secondary | ICD-10-CM | POA: Diagnosis not present

## 2015-01-28 DIAGNOSIS — R7309 Other abnormal glucose: Secondary | ICD-10-CM | POA: Diagnosis not present

## 2015-01-28 DIAGNOSIS — I10 Essential (primary) hypertension: Secondary | ICD-10-CM | POA: Diagnosis not present

## 2015-02-05 DIAGNOSIS — N401 Enlarged prostate with lower urinary tract symptoms: Secondary | ICD-10-CM | POA: Diagnosis not present

## 2015-02-12 DIAGNOSIS — N135 Crossing vessel and stricture of ureter without hydronephrosis: Secondary | ICD-10-CM | POA: Diagnosis not present

## 2015-02-12 DIAGNOSIS — N401 Enlarged prostate with lower urinary tract symptoms: Secondary | ICD-10-CM | POA: Diagnosis not present

## 2015-02-12 DIAGNOSIS — R351 Nocturia: Secondary | ICD-10-CM | POA: Diagnosis not present

## 2015-02-12 DIAGNOSIS — N3020 Other chronic cystitis without hematuria: Secondary | ICD-10-CM | POA: Diagnosis not present

## 2015-02-12 DIAGNOSIS — N138 Other obstructive and reflux uropathy: Secondary | ICD-10-CM | POA: Diagnosis not present

## 2015-05-14 DIAGNOSIS — Z86718 Personal history of other venous thrombosis and embolism: Secondary | ICD-10-CM | POA: Diagnosis not present

## 2015-05-14 DIAGNOSIS — R972 Elevated prostate specific antigen [PSA]: Secondary | ICD-10-CM | POA: Diagnosis not present

## 2015-05-14 DIAGNOSIS — I10 Essential (primary) hypertension: Secondary | ICD-10-CM | POA: Diagnosis not present

## 2015-05-14 DIAGNOSIS — R221 Localized swelling, mass and lump, neck: Secondary | ICD-10-CM | POA: Diagnosis not present

## 2015-05-14 DIAGNOSIS — R7309 Other abnormal glucose: Secondary | ICD-10-CM | POA: Diagnosis not present

## 2015-06-11 DIAGNOSIS — L720 Epidermal cyst: Secondary | ICD-10-CM | POA: Diagnosis not present

## 2015-06-11 DIAGNOSIS — L821 Other seborrheic keratosis: Secondary | ICD-10-CM | POA: Diagnosis not present

## 2015-07-31 DIAGNOSIS — E785 Hyperlipidemia, unspecified: Secondary | ICD-10-CM | POA: Diagnosis not present

## 2015-07-31 DIAGNOSIS — I10 Essential (primary) hypertension: Secondary | ICD-10-CM | POA: Diagnosis not present

## 2015-07-31 DIAGNOSIS — R7309 Other abnormal glucose: Secondary | ICD-10-CM | POA: Diagnosis not present

## 2015-07-31 DIAGNOSIS — Z0000 Encounter for general adult medical examination without abnormal findings: Secondary | ICD-10-CM | POA: Diagnosis not present

## 2015-09-10 DIAGNOSIS — R351 Nocturia: Secondary | ICD-10-CM | POA: Diagnosis not present

## 2015-09-10 DIAGNOSIS — N3020 Other chronic cystitis without hematuria: Secondary | ICD-10-CM | POA: Diagnosis not present

## 2015-09-10 DIAGNOSIS — N401 Enlarged prostate with lower urinary tract symptoms: Secondary | ICD-10-CM | POA: Diagnosis not present

## 2015-10-05 DIAGNOSIS — R310 Gross hematuria: Secondary | ICD-10-CM | POA: Diagnosis not present

## 2015-10-05 DIAGNOSIS — N390 Urinary tract infection, site not specified: Secondary | ICD-10-CM | POA: Diagnosis not present

## 2015-10-09 DIAGNOSIS — N3020 Other chronic cystitis without hematuria: Secondary | ICD-10-CM | POA: Diagnosis not present

## 2015-10-09 DIAGNOSIS — R310 Gross hematuria: Secondary | ICD-10-CM | POA: Diagnosis not present

## 2015-10-09 DIAGNOSIS — N135 Crossing vessel and stricture of ureter without hydronephrosis: Secondary | ICD-10-CM | POA: Diagnosis not present

## 2015-10-09 DIAGNOSIS — N390 Urinary tract infection, site not specified: Secondary | ICD-10-CM | POA: Diagnosis not present

## 2015-10-10 ENCOUNTER — Other Ambulatory Visit: Payer: Self-pay | Admitting: Urology

## 2015-10-17 ENCOUNTER — Encounter (HOSPITAL_BASED_OUTPATIENT_CLINIC_OR_DEPARTMENT_OTHER): Payer: Self-pay | Admitting: *Deleted

## 2015-10-17 NOTE — Progress Notes (Signed)
NPO AFTER MN.   ARRIVE AT 0745.  NEEDS ISTAT AND EKG.  WILL TAKE LOSARTAN AM DOS W/ SIPS OF WATER.

## 2015-10-22 NOTE — H&P (Signed)
Reason For Visit       History of Present Illness  Mr Tambasco presents today on a more urgent basis. I just saw him back in November 2016 and the note from that time will outline his history. At that point he was doing okay. Stream was reasonable. He had no dysuria. He had a normal PSA. He did have evidence of some chronic colonization and again some evidence of incomplete bladder emptying. This past weekend he began noticing both some total hematuria as well as some dysuria and a severe increase in urinary frequency. He was seen in an urgent care center where he was felt to have a urinary tract infection but again he does have chronically abnormal urinalysis due to his stricture. The culture was apparently negative but he was started on antibiotics. His symptoms have improved but have not resolved. Urine continues to be abnormal. Postvoid residual today is actually less than an ounce.       Past Gu Hx:    Akeim presented in 1/ 2014 as a referral from the internal medicine program for followup of some ongoing issues with recurrent cystitis, questionable urethral stricture disease and questionable history of nephrolithiasis. Wissam is currently 69 years of age. He moved to this area from [place] in 2012. He apparently has had some urologic problems over the past 5 years. He did apparently develop some urinary retention in 2008 at the time of his CVA. There was some question of urethral stricture disease. He has had multiple recurrent problems with cystitis. His local urologic care has been at the [place] and we have very few/poor records. There is mention of normal PSA testing, as well as normal renal function via creatinine. I could find no records of any renal imaging studies. He had been on antibiotic suppressive therapy. The patient was hospitalized for several days approximately a month ago at [facility]. He was given a diagnosis of acute pyelonephritis, although this may have also been a febrile  urinary tract infection/prostatitis. No imaging studies were done. Urine culture was positive for Klebsiella pneumonia. His symptoms have improved.     At the time of his initial visit his urine culture continued to be positive for Klebsiella pneumonia. He was prescribed a two-week course of Septra. The patient has also had a CT scan of his abdomen and pelvis. This did show some bladder cellules but no evidence of urinary tract calcifications or obstruction. No sign of malignancy    The patient clearly does not empty his bladder but his postvoid residual is not particularly concerning. Cystoscopically he clearly has evidence of bulbar urethral stricture disease. I would estimate the opening of no more than 4-5 [place]. The patient seems to think he might have had a urethral stent put in. I could see no definitive evidence of a stent but it would be helpful if we could get his records. I intended to leave things alone since he seems to be clinically much better but I wouldn't be surprised if this does worsen in the future and he will require additional assessment and intervention. I would consider balloon dilation of this but again it would be nice to know what has been done in the past.     Past Medical History Problems  1. History of heartburn (Z87.898) 2. History of hypercholesterolemia (Z86.39) 3. History of hypertension (Z86.79) 4. History of Stroke syndrome (I63.9) 5. History of Thrombophlebitis Of Deep Vessels Of The Lower Extremity  Current Meds 1. Cefuroxime Axetil 250  MG Oral Tablet; TAKE 1 TABLET EVERY 12 HOURS DAILY;  Therapy: (Recorded:14Dec2016) to Recorded 2. Ecotrin Low Strength TBEC;  Therapy: (Recorded:14Jan2014) to Recorded 3. Losartan Potassium 100 MG Oral Tablet;  Therapy: (Recorded:14Jan2014) to Recorded 4. Simvastatin 40 MG Oral Tablet;  Therapy: (Recorded:14Jan2014) to Recorded  Allergies Medication  1. No Known Drug Allergies  Family History Problems  1.  Family history of Death In The Family Father : Father   43yrs 2. Family history of Death In The Family Mother : Mother   1yrs, unknown, pt can't recall he was to young 30. Family history of Family Health Status Number Of Children   1 son 4. Family history of No Significant Family History  Social History Problems  1. Denied: History of Alcohol Use (History) 2. Denied: History of Caffeine Use 3. Former smoker [phone]) 4. Marital History - Currently Married 5. Occupation:   retired/ disabled 49. Tobacco use (Z72.0)   1/2 ppd for 34yrs, nonsmoker for the past 76yrs  Review of Systems Genitourinary, constitutional, skin, eye, otolaryngeal, hematologic/lymphatic, cardiovascular, pulmonary, endocrine, musculoskeletal, gastrointestinal, neurological and psychiatric system(s) were reviewed and pertinent findings if present are noted and are otherwise negative.  Genitourinary: urinary frequency, dysuria, nocturia, weak urinary stream, urinary stream starts and stops, incomplete emptying of bladder, hematuria and cloudy urine.  Integumentary: pruritus.  Neurological: limb weakness.    Vitals Vital Signs [Data Includes: Last 1 Day]  Recorded: [id number] 10:56AM  Height: 6 ft 1 in Weight: 235 lb  BMI Calculated: 31 BSA Calculated: 2.3 Blood Pressure: 142 / 81 Heart Rate: 107  Physical Exam Constitutional: Well nourished and well developed . No acute distress.  ENT:. The ears and nose are normal in appearance.  Neck: The appearance of the neck is normal and no neck mass is present.  Pulmonary: No respiratory distress and normal respiratory rhythm and effort.  Cardiovascular: Heart rate and rhythm are normal . No peripheral edema.  Abdomen: The abdomen is soft and nontender. No masses are palpated. No CVA tenderness. No hernias are palpable. No hepatosplenomegaly noted.  Rectal: Rectal exam demonstrates normal sphincter tone, no tenderness and no masses. Estimated prostate size is 1+.  Normal rectal tone, no rectal masses, prostate is smooth, symmetric and non-tender. The prostate has no nodularity and is not tender. The left seminal vesicle is nonpalpable. The right seminal vesicle is nonpalpable. The perineum is normal on inspection.  Genitourinary: Examination of the penis demonstrates no discharge, no masses, no lesions and a normal meatus. The penis is uncircumcised. The scrotum is without lesions. The right epididymis is palpably normal and non-tender. The left epididymis is palpably normal and non-tender. The right testis is non-tender and without masses. The left testis is non-tender and without masses.  Lymphatics: The femoral and inguinal nodes are not enlarged or tender.  Skin: Normal skin turgor, no visible rash and no visible skin lesions.  Neuro/Psych:. Mood and affect are appropriate.    Results/Data Urine [Data Includes: Last 1 Day]   [id number] COLOR ORANGE  APPEARANCE TURBID  SPECIFIC GRAVITY TNP  pH TNP  GLUCOSE TNP  BILIRUBIN TNP  KETONE TNP  BLOOD TNP  PROTEIN TNP  NITRITE TNP  LEUKOCYTE ESTERASE TNP  SQUAMOUS EPITHELIAL/HPF 0-5 HPF WBC 20-40 WBC/HPF RBC 40-60 RBC/HPF BACTERIA MANY HPF CRYSTALS See Below HPF CASTS NONE SEEN LPF Yeast NONE SEEN HPF  Procedure  Procedure: Cystoscopy   Indication: Hematuria. Urethral Stricture.  Informed Consent: Risks, benefits, and potential adverse events were discussed and informed consent was  obtained from the patient . Specific risks including, but not limited to bleeding, infection, pain, allergic reaction etc. were explained.  Prep: The patient was prepped with betadine.  Anesthesia:. Local anesthesia was administered intraurethrally with 2% lidocaine jelly.  Antibiotic prophylaxis:. On cefuroxime.  Procedure Note:  Urethral meatus:. No abnormalities.  Anterior urethra:. A severe stricture was present in the bulbar urethra but was not manipulated.  Bladder:    Assessment Assessed  1. 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 Gross hematuria (R31.0) 3. 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 Benign prostatic hyperplasia with urinary obstruction (N40.1,N13.8)  Plan Benign prostatic hyperplasia with urinary obstruction, Ureteral stricture  1. PVR U/S; Status:Complete;   [id number] Gross hematuria  2. Cysto; Status:Hold For - Appointment,Date of Service; Requested for:14Dec2016;  Ureteral stricture  3. Follow-up Schedule Surgery Office  Follow-up  Status: Hold For - Appointment   Requested for: [id number] Urinary tract infection  4. URINE CULTURE; Status:In Progress - Specimen/Data Collected;   Done: [id number]  Discussion/Summary   Yedidya has had chronic urethral stricture disease but we have tried to be as conservative as possible given the fact that he has done reasonably well as of late. He previously was noted to have some problems with recurrent cystitis but that problem then resolved. His most recent cultures x2 have been negative, but he does continue now to have increasing voiding symptoms. He has also had hematuria. At this point I do think it is essential that we further evaluate his bladder. I was unable to advance the scope through the stricture area today. He has what appears to be a 5 to 6-French urethral stricture in the bulbar urethra. It is difficult to know how long that stricture is. My plan would be to do a cystoscopy with retrograde urethrography along with a balloon dilation and then thorough evaluation of his prostatic urethra and bladder. We can talk about some strategies once we make sure there is nothing else going on pathologically. I would anticipate outpatient surgery. I would anticipate the need for a catheter for 2 days followed by a voiding trial here in our office. We will reculture the urine to be on the safe side. I would recommend completion of the cephalosporin he was started on several days ago, which is planned for 1 week.    Signatures Electronically signed by :  [name], M.D.; Oct 10 2015  8:17AM EST

## 2015-10-23 ENCOUNTER — Encounter (HOSPITAL_BASED_OUTPATIENT_CLINIC_OR_DEPARTMENT_OTHER)
Admission: RE | Disposition: A | Discharge status: Home / Self Care | Payer: Self-pay | Source: Ambulatory Visit | Attending: Urology

## 2015-10-23 ENCOUNTER — Ambulatory Visit (HOSPITAL_BASED_OUTPATIENT_CLINIC_OR_DEPARTMENT_OTHER): Payer: Medicare Other | Admitting: Anesthesiology

## 2015-10-23 ENCOUNTER — Encounter (HOSPITAL_BASED_OUTPATIENT_CLINIC_OR_DEPARTMENT_OTHER): Payer: Self-pay | Admitting: *Deleted

## 2015-10-23 ENCOUNTER — Ambulatory Visit (HOSPITAL_BASED_OUTPATIENT_CLINIC_OR_DEPARTMENT_OTHER)
Admission: RE | Admit: 2015-10-23 | Discharge: 2015-10-23 | Disposition: A | Discharge status: Home / Self Care | Payer: Medicare Other | Source: Ambulatory Visit | Attending: Urology | Admitting: Urology

## 2015-10-23 DIAGNOSIS — I69898 Other sequelae of other cerebrovascular disease: Secondary | ICD-10-CM | POA: Diagnosis not present

## 2015-10-23 DIAGNOSIS — N138 Other obstructive and reflux uropathy: Secondary | ICD-10-CM | POA: Insufficient documentation

## 2015-10-23 DIAGNOSIS — Z79899 Other long term (current) drug therapy: Secondary | ICD-10-CM | POA: Diagnosis not present

## 2015-10-23 DIAGNOSIS — R350 Frequency of micturition: Secondary | ICD-10-CM | POA: Insufficient documentation

## 2015-10-23 DIAGNOSIS — R3914 Feeling of incomplete bladder emptying: Secondary | ICD-10-CM | POA: Insufficient documentation

## 2015-10-23 DIAGNOSIS — Z87891 Personal history of nicotine dependence: Secondary | ICD-10-CM | POA: Diagnosis not present

## 2015-10-23 DIAGNOSIS — N401 Enlarged prostate with lower urinary tract symptoms: Secondary | ICD-10-CM | POA: Insufficient documentation

## 2015-10-23 DIAGNOSIS — I69854 Hemiplegia and hemiparesis following other cerebrovascular disease affecting left non-dominant side: Secondary | ICD-10-CM | POA: Diagnosis not present

## 2015-10-23 DIAGNOSIS — R351 Nocturia: Secondary | ICD-10-CM | POA: Insufficient documentation

## 2015-10-23 DIAGNOSIS — R3912 Poor urinary stream: Secondary | ICD-10-CM | POA: Insufficient documentation

## 2015-10-23 DIAGNOSIS — I10 Essential (primary) hypertension: Secondary | ICD-10-CM | POA: Diagnosis not present

## 2015-10-23 DIAGNOSIS — E7800 Pure hypercholesterolemia, unspecified: Secondary | ICD-10-CM | POA: Diagnosis not present

## 2015-10-23 DIAGNOSIS — R319 Hematuria, unspecified: Secondary | ICD-10-CM | POA: Diagnosis present

## 2015-10-23 DIAGNOSIS — N359 Urethral stricture, unspecified: Secondary | ICD-10-CM | POA: Insufficient documentation

## 2015-10-23 DIAGNOSIS — Z7982 Long term (current) use of aspirin: Secondary | ICD-10-CM | POA: Insufficient documentation

## 2015-10-23 DIAGNOSIS — N3021 Other chronic cystitis with hematuria: Secondary | ICD-10-CM | POA: Diagnosis not present

## 2015-10-23 DIAGNOSIS — N358 Other urethral stricture: Secondary | ICD-10-CM | POA: Diagnosis not present

## 2015-10-23 DIAGNOSIS — R310 Gross hematuria: Secondary | ICD-10-CM | POA: Diagnosis not present

## 2015-10-23 HISTORY — DX: Presence of other vascular implants and grafts: Z95.828

## 2015-10-23 HISTORY — DX: Personal history of urinary (tract) infections: Z87.440

## 2015-10-23 HISTORY — DX: Personal history of other diseases of urinary system: Z87.448

## 2015-10-23 HISTORY — DX: Gross hematuria: R31.0

## 2015-10-23 HISTORY — DX: Weakness: R53.1

## 2015-10-23 HISTORY — DX: Foot drop, left foot: M21.372

## 2015-10-23 HISTORY — DX: Hyperlipidemia, unspecified: E78.5

## 2015-10-23 HISTORY — DX: Urgency of urination: R39.15

## 2015-10-23 HISTORY — PX: CYSTOSCOPY WITH RETROGRADE URETHROGRAM: SHX6309

## 2015-10-23 HISTORY — DX: Presence of external hearing-aid: Z97.4

## 2015-10-23 HISTORY — DX: Unspecified urethral stricture, male, unspecified site: N35.919

## 2015-10-23 HISTORY — DX: Unspecified sequelae of cerebral infarction: I69.30

## 2015-10-23 HISTORY — DX: Personal history of other venous thrombosis and embolism: Z86.718

## 2015-10-23 HISTORY — DX: Presence of spectacles and contact lenses: Z97.3

## 2015-10-23 HISTORY — DX: Frequency of micturition: R35.0

## 2015-10-23 HISTORY — PX: CYSTOSCOPY WITH URETHRAL DILATATION: SHX5125

## 2015-10-23 LAB — POCT I-STAT, CHEM 8
BUN: 11 mg/dL (ref 6–20)
Calcium, Ion: 1.27 mmol/L (ref 1.13–1.30)
Chloride: 103 mmol/L (ref 101–111)
Creatinine, Ser: 0.9 mg/dL (ref 0.61–1.24)
Glucose, Bld: 127 mg/dL — ABNORMAL HIGH (ref 65–99)
HCT: 51 % (ref 39.0–52.0)
Hemoglobin: 17.3 g/dL — ABNORMAL HIGH (ref 13.0–17.0)
Potassium: 4 mmol/L (ref 3.5–5.1)
Sodium: 143 mmol/L (ref 135–145)
TCO2: 27 mmol/L (ref 0–100)

## 2015-10-23 SURGERY — CYSTOSCOPY, WITH URETHRAL DILATION
Anesthesia: General | Site: Bladder

## 2015-10-23 MED ORDER — DEXAMETHASONE SODIUM PHOSPHATE 10 MG/ML IJ SOLN
INTRAMUSCULAR | Status: AC
  Filled 2015-10-23: qty 1

## 2015-10-23 MED ORDER — LIDOCAINE HCL (CARDIAC) 20 MG/ML IV SOLN
INTRAVENOUS | Status: AC
  Filled 2015-10-23: qty 5

## 2015-10-23 MED ORDER — DIATRIZOATE MEGLUMINE 30 % UR SOLN
URETHRAL | Status: DC | PRN
  Administered 2015-10-23: 300 mL via URETHRAL

## 2015-10-23 MED ORDER — DEXTROSE 5 % IV SOLN
INTRAVENOUS | Status: AC
Start: 2015-10-23 — End: 2015-10-23
  Filled 2015-10-23: qty 50

## 2015-10-23 MED ORDER — ONDANSETRON HCL 4 MG/2ML IJ SOLN
INTRAMUSCULAR | Status: AC
  Filled 2015-10-23: qty 2

## 2015-10-23 MED ORDER — CEFTRIAXONE SODIUM 2 G IJ SOLR
INTRAMUSCULAR | Status: AC
  Filled 2015-10-23: qty 2

## 2015-10-23 MED ORDER — HYDROCODONE-ACETAMINOPHEN 5-325 MG PO TABS: 1.0000 | ORAL_TABLET | Freq: Four times a day (QID) | ORAL | Status: DC | PRN

## 2015-10-23 MED ORDER — DEXAMETHASONE SODIUM PHOSPHATE 4 MG/ML IJ SOLN
INTRAMUSCULAR | Status: DC | PRN
  Administered 2015-10-23: 10 mg via INTRAVENOUS

## 2015-10-23 MED ORDER — DEXTROSE 5 % IV SOLN
2.0000 g | INTRAVENOUS | Status: AC
  Administered 2015-10-23: 2 g via INTRAVENOUS
  Filled 2015-10-23: qty 2

## 2015-10-23 MED ORDER — FENTANYL CITRATE (PF) 100 MCG/2ML IJ SOLN
25.0000 ug | INTRAMUSCULAR | Status: DC | PRN
  Filled 2015-10-23: qty 1

## 2015-10-23 MED ORDER — PHENYLEPHRINE HCL 10 MG/ML IJ SOLN
INTRAMUSCULAR | Status: DC | PRN
  Administered 2015-10-23 (×2): 120 ug via INTRAVENOUS
  Administered 2015-10-23: 80 ug via INTRAVENOUS

## 2015-10-23 MED ORDER — MIDAZOLAM HCL 2 MG/2ML IJ SOLN
INTRAMUSCULAR | Status: AC
  Filled 2015-10-23: qty 2

## 2015-10-23 MED ORDER — DEXTROSE 5 % IV SOLN
1.0000 g | INTRAVENOUS | Status: DC
  Filled 2015-10-23: qty 10

## 2015-10-23 MED ORDER — WHITE PETROLATUM GEL
Status: AC
Start: 2015-10-23 — End: 2015-10-23
  Filled 2015-10-23: qty 5

## 2015-10-23 MED ORDER — PROPOFOL 10 MG/ML IV BOLUS
INTRAVENOUS | Status: AC
  Filled 2015-10-23: qty 20

## 2015-10-23 MED ORDER — LIDOCAINE HCL (CARDIAC) 20 MG/ML IV SOLN
INTRAVENOUS | Status: DC | PRN
  Administered 2015-10-23: 100 mg via INTRAVENOUS

## 2015-10-23 MED ORDER — WHITE PETROLATUM GEL
Status: AC
  Filled 2015-10-23: qty 5

## 2015-10-23 MED ORDER — LACTATED RINGERS IV SOLN
INTRAVENOUS | Status: DC
  Filled 2015-10-23: qty 1000

## 2015-10-23 MED ORDER — FENTANYL CITRATE (PF) 100 MCG/2ML IJ SOLN
INTRAMUSCULAR | Status: DC | PRN
  Administered 2015-10-23: 50 ug via INTRAVENOUS

## 2015-10-23 MED ORDER — MIDAZOLAM HCL 5 MG/5ML IJ SOLN
INTRAMUSCULAR | Status: DC | PRN
  Administered 2015-10-23: 1 mg via INTRAVENOUS

## 2015-10-23 MED ORDER — PROPOFOL 10 MG/ML IV BOLUS
INTRAVENOUS | Status: DC | PRN
  Administered 2015-10-23: 250 mg via INTRAVENOUS

## 2015-10-23 MED ORDER — FENTANYL CITRATE (PF) 250 MCG/5ML IJ SOLN
INTRAMUSCULAR | Status: AC
  Filled 2015-10-23: qty 5

## 2015-10-23 MED ORDER — SODIUM CHLORIDE 0.9 % IR SOLN
Status: DC | PRN
  Administered 2015-10-23 (×2): 1000 mL

## 2015-10-23 MED ORDER — LACTATED RINGERS IV SOLN
INTRAVENOUS | Status: DC
  Administered 2015-10-23: 09:00:00 via INTRAVENOUS
  Filled 2015-10-23: qty 1000

## 2015-10-23 MED ORDER — IOHEXOL 350 MG/ML SOLN
INTRAVENOUS | Status: DC | PRN
  Administered 2015-10-23: 25 mL

## 2015-10-23 MED ORDER — PHENYLEPHRINE 40 MCG/ML (10ML) SYRINGE FOR IV PUSH (FOR BLOOD PRESSURE SUPPORT)
PREFILLED_SYRINGE | INTRAVENOUS | Status: AC
Start: 2015-10-23 — End: 2015-10-23
  Filled 2015-10-23: qty 10

## 2015-10-23 MED ORDER — FENTANYL CITRATE (PF) 100 MCG/2ML IJ SOLN
INTRAMUSCULAR | Status: AC
  Filled 2015-10-23: qty 2

## 2015-10-23 SURGICAL SUPPLY — 20 items
BAG DRAIN URO-CYSTO SKYTR STRL (DRAIN) ×2 IMPLANT
BAG URINE LEG 500ML (DRAIN) ×2 IMPLANT
BALLN NEPHROMAX 24X8X12 (CATHETERS) ×2
BALLOON NEPHROMAX 24X8X12 (CATHETERS) ×1 IMPLANT
CATH FOLEY 2WAY SLVR  5CC 16FR (CATHETERS) ×1
CATH FOLEY 2WAY SLVR 5CC 16FR (CATHETERS) ×1 IMPLANT
CLOTH BEACON ORANGE TIMEOUT ST (SAFETY) ×2 IMPLANT
GLOVE BIO SURGEON STRL SZ 6.5 (GLOVE) ×2 IMPLANT
GLOVE BIO SURGEON STRL SZ7.5 (GLOVE) ×2 IMPLANT
GLOVE BIOGEL PI IND STRL 6.5 (GLOVE) ×2 IMPLANT
GLOVE BIOGEL PI INDICATOR 6.5 (GLOVE) ×2
GOWN STRL REUS W/ TWL XL LVL3 (GOWN DISPOSABLE) ×2 IMPLANT
GOWN STRL REUS W/TWL XL LVL3 (GOWN DISPOSABLE) ×2
GUIDEWIRE STR DUAL SENSOR (WIRE) ×2 IMPLANT
IV NS 1000ML (IV SOLUTION) ×2
IV NS 1000ML BAXH (IV SOLUTION) ×2 IMPLANT
KIT ROOM TURNOVER WOR (KITS) ×2 IMPLANT
MANIFOLD NEPTUNE II (INSTRUMENTS) ×2 IMPLANT
NS IRRIG 500ML POUR BTL (IV SOLUTION) ×2 IMPLANT
PACK CYSTO (CUSTOM PROCEDURE TRAY) ×2 IMPLANT

## 2015-10-23 NOTE — Anesthesia Procedure Notes (Signed)
Procedure Name: LMA Insertion Date/Time: 10/23/2015 9:40 AM Performed by: Wanita Chamberlain Pre-anesthesia Checklist: Patient identified, Timeout performed, Suction available, Emergency Drugs available and Patient being monitored Patient Re-evaluated:Patient Re-evaluated prior to inductionOxygen Delivery Method: Circle system utilized Preoxygenation: Pre-oxygenation with 100% oxygen Intubation Type: IV induction Ventilation: Mask ventilation without difficulty LMA: LMA inserted LMA Size: 5.0 Number of attempts: 1 Placement Confirmation: breath sounds checked- equal and bilateral and positive ETCO2 Tube secured with: Tape Dental Injury: Teeth and Oropharynx as per pre-operative assessment

## 2015-10-23 NOTE — Transfer of Care (Signed)
Immediate Anesthesia Transfer of Care Note  Patient: Robert [name]  Procedure(s) Performed: Procedure(s) with comments: CYSTOSCOPY WITH URETHRAL BALLOON DILATATION (N/A) - BALLOON DILATION  CYSTOSCOPY WITH RETROGRADE URETHROGRAM (N/A)  Patient Location: PACU  Anesthesia Type:General  Level of Consciousness: awake, alert , oriented and patient cooperative  Airway & Oxygen Therapy: Patient Spontanous Breathing and Patient connected to nasal cannula oxygen  Post-op Assessment: Report given to RN and Post -op Vital signs reviewed and stable  Post vital signs: Reviewed and stable  Last Vitals:  Filed Vitals:   10/23/15 1015 10/23/15 1030  BP:  118/70  Pulse: 71 74  Temp:  36.5 C  Resp: 16 16    Complications: No apparent anesthesia complications

## 2015-10-23 NOTE — Anesthesia Postprocedure Evaluation (Signed)
Anesthesia Post Note  Patient: Robert [name]  Procedure(s) Performed: Procedure(s) (LRB): CYSTOSCOPY WITH URETHRAL BALLOON DILATATION (N/A) CYSTOSCOPY WITH RETROGRADE URETHROGRAM (N/A)  Patient location during evaluation: PACU Anesthesia Type: General Level of consciousness: awake and alert Pain management: pain level controlled Vital Signs Assessment: post-procedure vital signs reviewed and stable Respiratory status: spontaneous breathing, nonlabored ventilation, respiratory function stable and patient connected to nasal cannula oxygen Cardiovascular status: blood pressure returned to baseline and stable Postop Assessment: no signs of nausea or vomiting Anesthetic complications: no    Last Vitals:  Filed Vitals:   10/23/15 1100 10/23/15 1157  BP: 130/82 144/74  Pulse: 75 74  Temp:  36.9 C  Resp: 16 14    Last Pain:  Filed Vitals:   10/23/15 1157  PainSc: Asleep                 Annika Selke L

## 2015-10-23 NOTE — Op Note (Signed)
Preoperative diagnosis: Urethral stricture disease, chronic cystitis, gross hematuria Postoperative diagnosis: Same  Procedure: Retrograde urethrogram, cystoscopy, balloon dilation of urethral stricture, cystogram   Surgeon:  S.  M.D.  Anesthesia: Gen.  Indications: Mr. Preziosi has had some long-standing voiding issues. More recently he has had chronic cystitis and gross hematuria. He's been noted in the past have a urethral stricture but had been voiding reasonably well and therefore we elected to continue to observe him. Recently on office cystoscopy was noted to have a 5 French bulbar urethral stricture. He has had increased difficulty with voiding, a recent infection and some ongoing gross hematuria. He presents now for further evaluation. He has undergone upper tract imaging.     Technique and findings: Patient was brought the operating room where he had successful induction general anesthesia. He was placed in lithotomy position and prepped and draped in the usual manner. Appropriate surgical timeout was performed.   Retrograde urethrogram was done with fluoroscopic interpretation. A 16 French Foley catheter was utilized. A short but tight bulbar stricture was noted measuring approximately 4-5 mm in length. A questionable second more proximal narrowing was also noted.    On cystoscopy we again appreciated a 5-6 French bulbar urethral stricture opening. Guidewire was placed through the stricture and into the bladder with fluoroscopic control. A 24 French dilating balloon was inflated to approximately 15 atm of pressure.   On removal of the dilating balloon one could easily pass the cystoscope into the bladder. The patient had considerable trilobar hyperplasia with visual obstruction. Prostatic urethra measured about 5 cm. The bladder showed some chronic inflammatory change but no evidence of stone or tumor. On the right lateral wall the bladder there were 3 diverticuli that appeared to  be moderate capacity.    Cystogram was performed. The diverticuli were easily seen on cystogram it did not appear to empty particularly well. The patient was brought to PACU in stable condition having had no obvious complications.        

## 2015-10-23 NOTE — Discharge Instructions (Addendum)
Cystoscopy patient instructions  Following a cystoscopy, a catheter (a flexible rubber tube) is sometimes left in place to empty the bladder. This may cause some discomfort or a feeling that you need to urinate. Your doctor determines the period of time that the catheter will be left in place. You may have bloody urine for two to three days (Call your doctor if the amount of bleeding increases or does not subside).  You may pass blood clots in your urine, especially if you had a biopsy. It is not unusual to pass small blood clots and have some bloody urine a couple of weeks after your cystoscopy. Again, call your doctor if the bleeding does not subside. You may have: Dysuria (painful urination) Frequency (urinating often) Urgency (strong desire to urinate)  These symptoms are common especially if medicine is instilled into the bladder or a ureteral stent is placed. Avoiding alcohol and caffeine, such as coffee, tea, and chocolate, may help relieve these symptoms. Drink plenty of water, unless otherwise instructed. Your doctor may also prescribe an antibiotic or other medicine to reduce these symptoms.  Cystoscopy results are available soon after the procedure; biopsy results usually take two to four days. Your doctor will discuss the results of your exam with you. Before you go home, you will be given specific instructions for follow-up care. Special Instructions:   If you are going home with a catheter in place do not take a tub bath until removed by your doctor.   You may resume your normal activities.   Do not drive or operate machinery if you are taking narcotic pain medicine.   Be sure to keep all follow-up appointments with your doctor.    Call Your Doctor If: The catheter is not draining  You have severe pain  You are unable to urinate  You have a fever over 101  You have severe bleeding         Cystoscopy patient instructions  Following a cystoscopy, a catheter (a  flexible rubber tube) is sometimes left in place to empty the bladder. This may cause some discomfort or a feeling that you need to urinate. Your doctor determines the period of time that the catheter will be left in place. You may have bloody urine for two to three days (Call your doctor if the amount of bleeding increases or does not subside).  You may pass blood clots in your urine, especially if you had a biopsy. It is not unusual to pass small blood clots and have some bloody urine a couple of weeks after your cystoscopy. Again, call your doctor if the bleeding does not subside. You may have: Dysuria (painful urination) Frequency (urinating often) Urgency (strong desire to urinate)  These symptoms are common especially if medicine is instilled into the bladder or a ureteral stent is placed. Avoiding alcohol and caffeine, such as coffee, tea, and chocolate, may help relieve these symptoms. Drink plenty of water, unless otherwise instructed. Your doctor may also prescribe an antibiotic or other medicine to reduce these symptoms.  Cystoscopy results are available soon after the procedure; biopsy results usually take two to four days. Your doctor will discuss the results of your exam with you. Before you go home, you will be given specific instructions for follow-up care. Special Instructions:   If you are going home with a catheter in place do not take a tub bath until removed by your doctor.   You may resume your normal activities.   Do not drive  or operate machinery if you are taking narcotic pain medicine.   Be sure to keep all follow-up appointments with your doctor.    Call Your Doctor If: The catheter is not draining  You have severe pain  You are unable to urinate  You have a fever over 101  You have severe bleeding          Post Anesthesia Home Care Instructions  Activity: Get plenty of rest for the remainder of the day. A responsible adult should stay with you for  24 hours following the procedure.  For the next 24 hours, DO NOT: -Drive a car -Paediatric nurse -Drink alcoholic beverages -Take any medication unless instructed by your physician -Make any legal decisions or sign important papers.  Meals: Start with liquid foods such as gelatin or soup. Progress to regular foods as tolerated. Avoid greasy, spicy, heavy foods. If nausea and/or vomiting occur, drink only clear liquids until the nausea and/or vomiting subsides. Call your physician if vomiting continues.  Special Instructions/Symptoms: Your throat may feel dry or sore from the anesthesia or the breathing tube placed in your throat during surgery. If this causes discomfort, gargle with warm salt water. The discomfort should disappear within 24 hours.  If you had a scopolamine patch placed behind your ear for the management of post- operative nausea and/or vomiting:  1. The medication in the patch is effective for 72 hours, after which it should be removed.  Wrap patch in a tissue and discard in the trash. Wash hands thoroughly with soap and water. 2. You may remove the patch earlier than 72 hours if you experience unpleasant side effects which may include dry mouth, dizziness or visual disturbances. 3. Avoid touching the patch. Wash your hands with soap and water after contact with the patch.   Foley Catheter Care, Adult A Foley catheter is a soft, flexible tube that is placed into the bladder to drain urine. A Foley catheter may be inserted if:  You leak urine or are not able to control when you urinate (urinary incontinence).  You are not able to urinate when you need to (urinary retention).  You had prostate surgery or surgery on the genitals.  You have certain medical conditions, such as multiple sclerosis, dementia, or a spinal cord injury. If you are going home with a Foley catheter in place, follow the instructions below. TAKING CARE OF THE CATHETER 1. Wash your hands with soap  and water. 2. Using mild soap and warm water on a clean washcloth:  Clean the area on your body closest to the catheter insertion site using a circular motion, moving away from the catheter. Never wipe toward the catheter because this could sweep bacteria up into the urethra and cause infection.  Remove all traces of soap. Pat the area dry with a clean towel. For males, reposition the foreskin. 3. Attach the catheter to your leg so there is no tension on the catheter. Use adhesive tape or a leg strap. If you are using adhesive tape, remove any sticky residue left behind by the previous tape you used. 4. Keep the drainage bag below the level of the bladder, but keep it off the floor. 5. Check throughout the day to be sure the catheter is working and urine is draining freely. Make sure the tubing does not become kinked. 6. Do not pull on the catheter or try to remove it. Pulling could damage internal tissues. TAKING CARE OF THE DRAINAGE BAGS You will be given  two drainage bags to take home. One is a large overnight drainage bag, and the other is a smaller leg bag that fits underneath clothing. You may wear the overnight bag at any time, but you should never wear the smaller leg bag at night. Follow the instructions below for how to empty, change, and clean your drainage bags. Emptying the Drainage Bag You must empty your drainage bag when it is  - full or at least 2-3 times a day. 1. Wash your hands with soap and water. 2. Keep the drainage bag below your hips, below the level of your bladder. This stops urine from going back into the tubing and into your bladder. 3. Hold the dirty bag over the toilet or a clean container. 4. Open the pour spout at the bottom of the bag and empty the urine into the toilet or container. Do not let the pour spout touch the toilet, container, or any other surface. Doing so can place bacteria on the bag, which can cause an infection. 5. Clean the pour spout with a gauze  pad or cotton ball that has rubbing alcohol on it. 6. Close the pour spout. 7. Attach the bag to your leg with adhesive tape or a leg strap. 8. Wash your hands well. Changing the Drainage Bag Change your drainage bag once a month or sooner if it starts to smell bad or look dirty. Below are steps to follow when changing the drainage bag. 1. Wash your hands with soap and water. 2. Pinch off the rubber catheter so that urine does not spill out. 3. Disconnect the catheter tube from the drainage tube at the connection valve. Do not let the tubes touch any surface. 4. Clean the end of the catheter tube with an alcohol wipe. Use a different alcohol wipe to clean the end of the drainage tube. 5. Connect the catheter tube to the drainage tube of the clean drainage bag. 6. Attach the new bag to the leg with adhesive tape or a leg strap. Avoid attaching the new bag too tightly. 7. Wash your hands well. Cleaning the Drainage Bag 1. Wash your hands with soap and water. 2. Wash the bag in warm, soapy water. 3. Rinse the bag thoroughly with warm water. 4. Fill the bag with a solution of white vinegar and water (1 cup vinegar to 1 qt warm water [.2 L vinegar to 1 L warm water]). Close the bag and soak it for 30 minutes in the solution. 5. Rinse the bag with warm water. 6. Hang the bag to dry with the pour spout open and hanging downward. 7. Store the clean bag (once it is dry) in a clean plastic bag. 8. Wash your hands well. PREVENTING INFECTION  Wash your hands before and after handling your catheter.  Take showers daily and wash the area where the catheter enters your body. Do not take baths. Replace wet leg straps with dry ones, if this applies.  Do not use powders, sprays, or lotions on the genital area. Only use creams, lotions, or ointments as directed by your caregiver.  For females, wipe from front to back after each bowel movement.  Drink enough fluids to keep your urine clear or pale yellow  unless you have a fluid restriction.  Do not let the drainage bag or tubing touch or lie on the floor.  Wear cotton underwear to absorb moisture and to keep your skin drier. SEEK MEDICAL CARE IF:   Your urine is cloudy or  smells unusually bad.  Your catheter becomes clogged.  You are not draining urine into the bag or your bladder feels full.  Your catheter starts to leak. SEEK IMMEDIATE MEDICAL CARE IF:   You have pain, swelling, redness, or pus where the catheter enters the body.  You have pain in the abdomen, legs, lower back, or bladder.  You have a fever.  You see blood fill the catheter, or your urine is pink or red.  You have nausea, vomiting, or chills.  Your catheter gets pulled out. MAKE SURE YOU:   Understand these instructions.  Will watch your condition.  Will get help right away if you are not doing well or get worse.   This information is not intended to replace advice given to you by your health care provider. Make sure you discuss any questions you have with your health care provider.   Document Released: 10/12/2005 Document Revised: 02/26/2014 Document Reviewed: 10/03/2012 Elsevier Interactive Patient Education [employer].

## 2015-10-23 NOTE — Interval H&P Note (Signed)
History and Physical Interval Note:  10/23/2015 9:11 AM  Robert [name]  has presented today for surgery, with the diagnosis of GROSS HEMATURIA, URETHRAL STRICTURE  The various methods of treatment have been discussed with the patient and family. After consideration of risks, benefits and other options for treatment, the patient has consented to  Procedure(s) with comments: CYSTOSCOPY WITH URETHRAL BALLOON DILATATION (N/A) - BALLOON DILATION  CYSTOSCOPY WITH RETROGRADE URETHROGRAM (N/A) as a surgical intervention .  The patient's history has been reviewed, patient examined, no change in status, stable for surgery.  I have reviewed the patient's chart and labs.  Questions were answered to the patient's satisfaction.     [name]

## 2015-10-23 NOTE — Anesthesia Preprocedure Evaluation (Addendum)
Anesthesia Evaluation  Patient identified by MRN, date of birth, ID band Patient awake    Reviewed: Allergy & Precautions, H&P , NPO status , Patient's Chart, lab work & pertinent test results  Airway Mallampati: II  TM Distance: >3 FB Neck ROM: full    Dental  (+) Dental Advisory Given, Poor Dentition, Missing All front teeth extremely worn down and left side upper front missing:   Pulmonary neg pulmonary ROS, former smoker,    Pulmonary exam normal breath sounds clear to auscultation       Cardiovascular Exercise Tolerance: Good hypertension, Pt. on medications Normal cardiovascular exam Rhythm:regular Rate:Normal     Neuro/Psych Left side weakness and foot drop. 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, Residual Symptoms negative neurological ROS  negative psych ROS   GI/Hepatic negative GI ROS, Neg liver ROS,   Endo/Other  negative endocrine ROS  Renal/GU negative Renal ROSStricture  negative genitourinary   Musculoskeletal   Abdominal   Peds  Hematology negative hematology ROS (+)   Anesthesia Other Findings   Reproductive/Obstetrics negative OB ROS                        Anesthesia Physical Anesthesia Plan  ASA: III  Anesthesia Plan: General   Post-op Pain Management:    Induction: Intravenous  Airway Management Planned: LMA  Additional Equipment:   Intra-op Plan:   Post-operative Plan:   Informed Consent: I have reviewed the patients History and Physical, chart, labs and discussed the procedure including the risks, benefits and alternatives for the proposed anesthesia with the patient or authorized representative who has indicated his/her understanding and acceptance.   Dental Advisory Given  Plan Discussed with: CRNA, Surgeon and Anesthesiologist  Anesthesia Plan Comments:        Anesthesia Quick Evaluation

## 2015-10-24 ENCOUNTER — Encounter (HOSPITAL_BASED_OUTPATIENT_CLINIC_OR_DEPARTMENT_OTHER): Payer: Self-pay | Admitting: Urology

## 2015-10-25 DIAGNOSIS — N138 Other obstructive and reflux uropathy: Secondary | ICD-10-CM | POA: Diagnosis not present

## 2015-10-25 DIAGNOSIS — N358 Other urethral stricture: Secondary | ICD-10-CM | POA: Diagnosis not present

## 2015-10-25 DIAGNOSIS — N401 Enlarged prostate with lower urinary tract symptoms: Secondary | ICD-10-CM | POA: Diagnosis not present

## 2015-11-01 DIAGNOSIS — R42 Dizziness and giddiness: Secondary | ICD-10-CM | POA: Diagnosis not present

## 2015-11-01 DIAGNOSIS — R112 Nausea with vomiting, unspecified: Secondary | ICD-10-CM | POA: Diagnosis not present

## 2015-11-01 DIAGNOSIS — N359 Urethral stricture, unspecified: Secondary | ICD-10-CM | POA: Diagnosis not present

## 2015-11-25 DIAGNOSIS — N401 Enlarged prostate with lower urinary tract symptoms: Secondary | ICD-10-CM | POA: Diagnosis not present

## 2015-11-25 DIAGNOSIS — N138 Other obstructive and reflux uropathy: Secondary | ICD-10-CM | POA: Diagnosis not present

## 2015-11-25 DIAGNOSIS — N3020 Other chronic cystitis without hematuria: Secondary | ICD-10-CM | POA: Diagnosis not present

## 2015-11-25 DIAGNOSIS — N358 Other urethral stricture: Secondary | ICD-10-CM | POA: Diagnosis not present

## 2015-11-25 DIAGNOSIS — R351 Nocturia: Secondary | ICD-10-CM | POA: Diagnosis not present

## 2015-11-25 DIAGNOSIS — N135 Crossing vessel and stricture of ureter without hydronephrosis: Secondary | ICD-10-CM | POA: Diagnosis not present

## 2015-12-26 DIAGNOSIS — R251 Tremor, unspecified: Secondary | ICD-10-CM | POA: Diagnosis not present

## 2015-12-26 DIAGNOSIS — F411 Generalized anxiety disorder: Secondary | ICD-10-CM | POA: Diagnosis not present

## 2015-12-26 DIAGNOSIS — R000 Tachycardia, unspecified: Secondary | ICD-10-CM | POA: Diagnosis not present

## 2016-01-15 ENCOUNTER — Ambulatory Visit: Payer: Medicare Other | Attending: Family Medicine | Admitting: Physical Therapy

## 2016-01-15 ENCOUNTER — Encounter: Payer: Self-pay | Admitting: Physical Therapy

## 2016-01-15 DIAGNOSIS — R29898 Other symptoms and signs involving the musculoskeletal system: Secondary | ICD-10-CM | POA: Diagnosis not present

## 2016-01-15 DIAGNOSIS — R269 Unspecified abnormalities of gait and mobility: Secondary | ICD-10-CM | POA: Insufficient documentation

## 2016-01-15 NOTE — Therapy (Signed)
[facility] [address], [place], [place], [address] Phone: [phone]   Fax:  [phone]  Physical Therapy Evaluation  Patient Details  Name: Robert [name] MRN: [id number] Date of Birth: March 28, 1946 Referring Provider: Dr. [name]  Encounter Date: 01/15/2016      PT End of Session - 01/15/16 1603    Visit Number 1   Number of Visits 10  Medicare   Date for PT Re-Evaluation 03/11/16   Authorization Type G-code 10th visit, Kx modifier on 15th visit   PT Start Time 1520   PT Stop Time 1600   PT Time Calculation (min) 40 min   Activity Tolerance Patient tolerated treatment well   Behavior During Therapy [facility] for tasks assessed/performed      Past Medical History  Diagnosis Date  . Hypertension   . GERD (gastroesophageal reflux disease)   . Hyperlipidemia   . Gross hematuria   . Urethral stricture   . S/P insertion of IVC (inferior vena caval) filter     2008  . History of DVT of lower extremity     2008--  cva  . History of acute pyelonephritis     10-13-2012  . Frequency of urination   . Urgency of urination   . Wears glasses   . Wears hearing aid     bilateral-- wears intermittantly  . History of CVA with residual deficit     2008--  left side of body weakness and foot drop (wears leg brace and uses cane)  . Weakness of left side of body     secondary to cva 2008  . Left foot drop     secondary to cva 2008    Past Surgical History  Procedure Laterality Date  . Ivc filter placement ([facility] hx)  2008  . Cystoscopy with urethral dilatation N/A 10/23/2015    Procedure: CYSTOSCOPY WITH URETHRAL BALLOON DILATATION;  Surgeon: [name], MD;  Location: [facility];  Service: Urology;  Laterality: N/A;  BALLOON DILATION   . Cystoscopy with retrograde urethrogram N/A 10/23/2015    Procedure: CYSTOSCOPY WITH RETROGRADE URETHROGRAM;  Surgeon: [name], MD;  Location: [facility];  Service: Urology;  Laterality: N/A;    There were no vitals filed for this visit.  Visit Diagnosis:  Abnormality of gait - Plan: PT plan of care cert/re-cert  Weakness of left lower extremity - Plan: PT plan of care cert/re-cert      Subjective Assessment - 01/15/16 1522    Subjective Patient reports she was not able to take her husband out of the house for walks and he became scared to do stairs and shaky.  Muscles are weak due to not able to get out of the house. Patient reports that before she had to work full time had him out  house 3 times per week go to the park to walk 45 min to 1 hour. Now has difficulty with stairs, wife helps the left leg down. Before 2 months walked in a store  30 min and now 10 min.    Patient is accompained by: Family member  wife   Patient Stated Goals walk longer at the store and stairs.    Currently in Pain? No/denies            [facility] PT Assessment - 01/15/16 0001    Assessment   Medical Diagnosis G81.90 Hemiparesis; l63.9 CVA ;R25.1 Tremor; M62.81 Weakness of left side of body  Referring Provider Dr. [name]   Onset Date/Surgical Date 08/27/15   Prior Therapy yes   Precautions   Precautions Fall   Balance Screen   Has the patient fallen in the past 6 months No   Has the patient had a decrease in activity level because of a fear of falling?  No   Is the patient reluctant to leave their home because of a fear of falling?  No   Home Environment   Living Environment Private residence   Living Arrangements Spouse/significant other   Type of [place] to enter   Prior Function   Level of Independence Requires assistive device for independence;Needs assistance with ADLs   Vocation Retired   Associate Professor   Overall Cognitive Status Within Functional Limits for tasks assessed   Observation/Other Assessments   Focus on Therapeutic Outcomes (FOTO)  72% limitation CL  goal is 48% limitation CK   ROM /  Strength   AROM / PROM / Strength AROM;PROM;Strength   Strength   Left Hip Flexion 3-/5   Left Hip Extension 2/5   Left Hip External Rotation 2-/5   Left Hip ABduction 2+/5   Left Knee Flexion 4/5   Left Knee Extension 3+/5   Ambulation/Gait   Ambulation/Gait Yes   Ambulation/Gait Assistance 6: Modified independent (Device/Increase time)   Ambulation Distance (Feet) 20 Feet   Assistive device Straight cane   Gait Pattern Decreased arm swing - left;Decreased step length - left;Decreased stance time - left;Decreased stride length;Decreased hip/knee flexion - left;Decreased dorsiflexion - left   Ambulation Surface Level   Stairs Yes  increased weight on right UE   Stairs Assistance 4: Min assist   Stairs Assistance Details (indicate cue type and reason) needs someone to prevent left leg form adducting across right, and off the top step going down   Stair Management Technique One rail Right;Forwards;Other (comment)  wife will pull on leg brace to guide left leg & prevent add                           PT Education - 01/15/16 1603    Education provided No          PT Short Term Goals - 01/15/16 1612    PT SHORT TERM GOAL #1   Title walk for 20 minutes in the park due to increased endurance with 2 rests   Time 4   Period Weeks   Status New   PT SHORT TERM GOAL #2   Title go down steps with minimal assistance to guide his left leg due to increased strength   Time 4   Period Weeks   Status New           PT Long Term Goals - 01/15/16 1549    PT LONG TERM GOAL #1   Title go down stairs without crossing left leg in front of right leg   Time 8   Period Weeks   Status New   PT LONG TERM GOAL #2   Title go down stairs with wife supervising him and not helping left leg   Time 8   Period Weeks   Status New   PT LONG TERM GOAL #3   Title walk in park for 45 min with 2 rests using the cane   Time 8   Period Weeks   Status New   PT LONG TERM GOAL #4    Title  walk in the store to [employer] for 30 minutes due to increased endurance   Time 8   Period Weeks   Status New   PT LONG TERM GOAL #5   Title independent with HEP with some assistance with his wife.    Time 8   Period Weeks   Status New               Plan - 01/23/16 1605    Clinical Impression Statement Patient is a 70 year old male with diagnosis of hemiparesis, CVA 2008, Tremor, and weakness of left side of body.  Patient wife reports he started to become weaker scince 08/2015,  when hsi wife went full tme.  [place] to this patient was walking 45 minutes in the park with wife, going down the stpes with supervision, walking in food store for 30 min.  Now she needs his wife to assist left foot down stairs to prevent hip adduction and getting the left foot off the step.  Patient is now able to walk 10 min. in the store and not go for the walks in the park.  Patient lives on the secon floor apartment with stairs leading into the apartment.  Patient has weakness in left  hip and knee and wears a brace on left ankle.  Patient is of moderate complexity and will benefit form physical therapy to improve left LE strength for stairs and walking.    Pt will benefit from skilled therapeutic intervention in order to improve on the following deficits Abnormal gait;Difficulty walking;Decreased activity tolerance;Decreased endurance;Decreased strength   Rehab Potential Good   Clinical Impairments Affecting Rehab Potential CVA in 2008; hemiparesis of left   PT Frequency 2x / week   PT Duration 8 weeks   PT Treatment/Interventions Passive range of motion;Patient/family education;Manual techniques;Functional mobility training;Therapeutic activities;Therapeutic exercise;Stair training;Gait training   PT Next Visit Plan strengthing of left hip and knee, stair training, gait training, endurance training   PT Home Exercise Plan hip exercises in standing   Recommended Other Services None   Consulted and  Agree with Plan of Care Patient;Family member/caregiver   Family Member Consulted wife          G-Codes - 2016/01/23 1558    Functional Assessment Tool Used FOTO score is 72% limitation CL   Functional Limitation Mobility: Walking and moving around   Mobility: Walking and Moving Around Current Status [phone]) At least 60 percent but less than 80 percent impaired, limited or restricted   Mobility: Walking and Moving Around Goal Status [phone]) At least 40 percent but less than 60 percent impaired, limited or restricted       Problem List Patient Active Problem List   Diagnosis Date Noted  . Acute pyelonephritis 09/29/2012  . Nausea 09/29/2012  . Fever 09/29/2012  . HTN (hypertension) 09/29/2012  . H/O: CVA (cerebrovascular accident) 09/29/2012  . Hearing loss 09/29/2012    Earlie Counts, PT January 23, 2016 4:16 PM   [facility] [address], [place], [place], [address] Phone: [phone]   Fax:  [phone]  Name: Robert [name] MRN: [id number] Date of Birth: [birth year]

## 2016-01-22 ENCOUNTER — Encounter: Payer: Self-pay | Admitting: Physical Therapy

## 2016-01-22 ENCOUNTER — Ambulatory Visit: Payer: Medicare Other | Admitting: Physical Therapy

## 2016-01-22 DIAGNOSIS — R29898 Other symptoms and signs involving the musculoskeletal system: Secondary | ICD-10-CM

## 2016-01-22 DIAGNOSIS — R269 Unspecified abnormalities of gait and mobility: Secondary | ICD-10-CM

## 2016-01-22 NOTE — Patient Instructions (Signed)
EXTENSION: Sitting (Active)    Sit with feet flat. Straighten right knee. Use ___ lbs. Complete __1_ sets of _20__ repetitions. Perform __2_ sessions per day. Bring leg down slowly. http://gtsc.exer.us/268   Copyright  VHI. All rights reserved.  FLEXION: Sitting (Active)    Sit, both feet flat. Lift right knee toward ceiling. Use ___ lbs. Complete ___ sets of ___ repetitions. Perform ___ sessions per day.  Copyright  VHI. All rights reserved.  FLEXION: Sitting (Active)    Sit, both feet flat. Lift right knee toward ceiling. Use ___ lbs. Complete _1__ sets of __20_ repetitions. Perform _2__ sessions per day. One leg at a time Copyright  VHI. All rights reserved.  ABDUCTION: Sitting - Resistance Band (Active)    Sit with feet flat. Lift right leg slightly and, against yellow resistance band, draw it out to side. Complete _1__ sets of _10__ repetitions. Perform _2Isometric Hip Adduction    With towel rolled between knees, press thighs together. Hold _10___ seconds while counting out loud. Repeat __10__ times. Do ____ sessions per day. 2 http://gt2.exer.us/629   Copyright  VHI. All rights reserved.  [facility] [address] [place], [place] [address] Phone # [phone] Fax [phone]

## 2016-01-22 NOTE — Therapy (Signed)
[facility] [address] [place], [place], [address] Phone: [phone]   Fax:  [phone]  Physical Therapy Treatment  Patient Details  Name: Robert [name] MRN: [id number] Date of Birth: [birth year] Referring Provider: Dr. Farris Has  Encounter Date: 01/22/2016      PT End of Session - 01/22/16 1618    Visit Number 2   Number of Visits 10  Medicare   Date for PT Re-Evaluation 03/11/16   Authorization Type G-code 10th visit, Kx modifier on 15th visit   PT Start Time 1530   PT Stop Time 1615   PT Time Calculation (min) 45 min   Activity Tolerance Patient tolerated treatment well   Behavior During Therapy [facility] for tasks assessed/performed      Past Medical History  Diagnosis Date  . Hypertension   . GERD (gastroesophageal reflux disease)   . Hyperlipidemia   . Gross hematuria   . Urethral stricture   . S/P insertion of IVC (inferior vena caval) filter     2008  . History of DVT of lower extremity     2008--  cva  . History of acute pyelonephritis     10-13-2012  . Frequency of urination   . Urgency of urination   . Wears glasses   . Wears hearing aid     bilateral-- wears intermittantly  . History of CVA with residual deficit     2008--  left side of body weakness and foot drop (wears leg brace and uses cane)  . Weakness of left side of body     secondary to cva 2008  . Left foot drop     secondary to cva 2008    Past Surgical History  Procedure Laterality Date  . Ivc filter placement ([facility] hx)  2008  . Cystoscopy with urethral dilatation N/A 10/23/2015    Procedure: CYSTOSCOPY WITH URETHRAL BALLOON DILATATION;  Surgeon: Barron Alvine, MD;  Location: [facility];  Service: Urology;  Laterality: N/A;  BALLOON DILATION   . Cystoscopy with retrograde urethrogram N/A 10/23/2015    Procedure: CYSTOSCOPY WITH RETROGRADE URETHROGRAM;  Surgeon: Barron Alvine, MD;  Location: [facility];  Service: Urology;  Laterality: N/A;    There were no vitals filed for this visit.  Visit Diagnosis:  Abnormality of gait  Weakness of left lower extremity      Subjective Assessment - 01/22/16 1550    Subjective I am doing well   Patient Stated Goals walk longer at the store and stairs.    Currently in Pain? No/denies                         [facility] Adult PT Treatment/Exercise - 01/22/16 0001    Ambulation/Gait   Ambulation/Gait Yes   Ambulation/Gait Assistance 6: Modified independent (Device/Increase time);5: Supervision   Assistive device Straight cane   Gait Comments walked 160 feet with verbal cues to take bigger step on the left   Knee/Hip Exercises: Aerobic   Nustep level one 7 min. feet are strapped    Knee/Hip Exercises: Seated   Long Arc Quad Right;20 reps   Long Arc Quad Weight 2 lbs.   Marching Right;20 reps  left with assistance weight   Marching Weights 2 lbs.                PT Education - 01/22/16 1618    Education provided Yes   Education Details knee  and hip strengthening   Person(s) Educated Patient   Methods Explanation;Demonstration;Verbal cues;Handout   Comprehension Returned demonstration;Verbalized understanding          PT Short Term Goals - 01/22/16 1625    PT SHORT TERM GOAL #1   Title walk for 20 minutes in the park due to increased endurance with 2 rests   Time 4   Period Weeks   Status On-going   PT SHORT TERM GOAL #2   Title go down steps with minimal assistance to guide his left leg due to increased strength   Time 4   Period Weeks   Status On-going           PT Long Term Goals - 01/15/16 1549    PT LONG TERM GOAL #1   Title go down stairs without crossing left leg in front of right leg   Time 8   Period Weeks   Status New   PT LONG TERM GOAL #2   Title go down stairs with wife supervising him and not helping left leg   Time 8   Period Weeks   Status New   PT LONG TERM GOAL  #3   Title walk in park for 45 min with 2 rests using the cane   Time 8   Period Weeks   Status New   PT LONG TERM GOAL #4   Title walk in the store to [employer] for 30 minutes due to increased endurance   Time 8   Period Weeks   Status New   PT LONG TERM GOAL #5   Title independent with HEP with some assistance with his wife.    Time 8   Period Weeks   Status New               Plan - 01/22/16 1621    Clinical Impression Statement Patient is a 70 year old male with diagnosis of hemiparesis, CVA 2008, Tremor, and weakness of left side of body.  Patient was able to walk with increased steadiness after therapy.  Patient was able to lift the left leg higher with ambulation.  Patient has not met goals due to just starting therapy.  Patient wil benefit from physical therapy  to increase strength and work on gait.    Pt will benefit from skilled therapeutic intervention in order to improve on the following deficits Abnormal gait;Difficulty walking;Decreased activity tolerance;Decreased endurance;Decreased strength   Rehab Potential Good   Clinical Impairments Affecting Rehab Potential CVA in 2008; hemiparesis of left   PT Frequency 2x / week   PT Duration 8 weeks   PT Treatment/Interventions Passive range of motion;Patient/family education;Manual techniques;Functional mobility training;Therapeutic activities;Therapeutic exercise;Stair training;Gait training   PT Next Visit Plan strengthing of left hip and knee, stair training, gait training, endurance training   PT Home Exercise Plan progress as needed   Recommended Other Services None   Consulted and Agree with Plan of Care Patient;Family member/caregiver   Family Member Consulted wife        Problem List Patient Active Problem List   Diagnosis Date Noted  . Acute pyelonephritis 09/29/2012  . Nausea 09/29/2012  . Fever 09/29/2012  . HTN (hypertension) 09/29/2012  . H/O: CVA (cerebrovascular accident) 09/29/2012  . Hearing  loss 09/29/2012    Earlie Counts, PT 01/22/2016 4:26 PM   [facility] [address], [place], [place], [address] Phone: [phone]   Fax:  [phone]  Name: Robert [name] MRN:  [id number] Date of Birth: [birth year]

## 2016-01-27 DIAGNOSIS — I10 Essential (primary) hypertension: Secondary | ICD-10-CM | POA: Diagnosis not present

## 2016-01-27 DIAGNOSIS — G8190 Hemiplegia, unspecified affecting unspecified side: Secondary | ICD-10-CM | POA: Diagnosis not present

## 2016-01-29 ENCOUNTER — Encounter: Payer: Self-pay | Admitting: Physical Therapy

## 2016-01-29 ENCOUNTER — Ambulatory Visit: Payer: Medicare Other | Attending: Family Medicine | Admitting: Physical Therapy

## 2016-01-29 DIAGNOSIS — M6281 Muscle weakness (generalized): Secondary | ICD-10-CM | POA: Diagnosis not present

## 2016-01-29 DIAGNOSIS — R2689 Other abnormalities of gait and mobility: Secondary | ICD-10-CM | POA: Insufficient documentation

## 2016-01-29 DIAGNOSIS — R269 Unspecified abnormalities of gait and mobility: Secondary | ICD-10-CM | POA: Diagnosis not present

## 2016-01-29 DIAGNOSIS — R531 Weakness: Secondary | ICD-10-CM | POA: Diagnosis not present

## 2016-01-29 DIAGNOSIS — R29898 Other symptoms and signs involving the musculoskeletal system: Secondary | ICD-10-CM | POA: Diagnosis not present

## 2016-01-29 NOTE — Therapy (Signed)
[facility] [address] [place], [place], [address] Phone: [phone]   Fax:  [phone]  Physical Therapy Treatment  Patient Details  Name: Robert [name] MRN: [id number] Date of Birth: [birth year] Referring Provider: Dr. [name]  Encounter Date: 01/29/2016      PT End of Session - 01/29/16 1537    Visit Number 3   Number of Visits 10   Date for PT Re-Evaluation 03/11/16   Authorization Type G-code 10th visit, Kx modifier on 15th visit   PT Start Time 1530   PT Stop Time 1610   PT Time Calculation (min) 40 min   Activity Tolerance Patient tolerated treatment well   Behavior During Therapy [facility] for tasks assessed/performed      Past Medical History  Diagnosis Date  . Hypertension   . GERD (gastroesophageal reflux disease)   . Hyperlipidemia   . Gross hematuria   . Urethral stricture   . S/P insertion of IVC (inferior vena caval) filter     2008  . History of DVT of lower extremity     2008--  cva  . History of acute pyelonephritis     10-13-2012  . Frequency of urination   . Urgency of urination   . Wears glasses   . Wears hearing aid     bilateral-- wears intermittantly  . History of CVA with residual deficit     2008--  left side of body weakness and foot drop (wears leg brace and uses cane)  . Weakness of left side of body     secondary to cva 2008  . Left foot drop     secondary to cva 2008    Past Surgical History  Procedure Laterality Date  . Ivc filter placement ([facility] hx)  2008  . Cystoscopy with urethral dilatation N/A 10/23/2015    Procedure: CYSTOSCOPY WITH URETHRAL BALLOON DILATATION;  Surgeon: [name], MD;  Location: [facility];  Service: Urology;  Laterality: N/A;  BALLOON DILATION   . Cystoscopy with retrograde urethrogram N/A 10/23/2015    Procedure: CYSTOSCOPY WITH RETROGRADE URETHROGRAM;  Surgeon: [name], MD;  Location: [facility];  Service: Urology;  Laterality: N/A;    There were no vitals filed for this visit.  Visit Diagnosis:  Weakness of left lower extremity  Abnormality of gait      Subjective Assessment - 01/29/16 1537    Subjective I could tell I had a workout last time.    Currently in Pain? No/denies                         [facility] Adult PT Treatment/Exercise - 01/29/16 0001    Knee/Hip Exercises: Aerobic   Nustep L1 x 8 min   Used RTUE   Knee/Hip Exercises: Standing   Hip Flexion AROM;Left;2 sets;10 reps;Knee bent   Knee/Hip Exercises: Seated   Long Arc Quad Right;20 reps   Long Arc Quad Weight 2 lbs.   Ball Squeeze 20x    Marching Right;20 reps  left with assistance weight   Marching Weights 2 lbs.                  PT Short Term Goals - 01/29/16 1537    PT SHORT TERM GOAL #1   Title walk for 20 minutes in the park due to increased endurance with 2 rests   Time 4   Period Weeks   Status On-going  Per pt 7-8 minutes   PT SHORT TERM GOAL #2   Title go down steps with minimal assistance to guide his left leg due to increased strength   Time 4   Period Weeks   Status On-going           PT Long Term Goals - 01/15/16 1549    PT LONG TERM GOAL #1   Title go down stairs without crossing left leg in front of right leg   Time 8   Period Weeks   Status New   PT LONG TERM GOAL #2   Title go down stairs with wife supervising him and not helping left leg   Time 8   Period Weeks   Status New   PT LONG TERM GOAL #3   Title walk in park for 45 min with 2 rests using the cane   Time 8   Period Weeks   Status New   PT LONG TERM GOAL #4   Title walk in the store to [employer] for 30 minutes due to increased endurance   Time 8   Period Weeks   Status New   PT LONG TERM GOAL #5   Title independent with HEP with some assistance with his wife.    Time 8   Period Weeks   Status New               Plan - 01/29/16 1538    Clinical Impression  Statement Pt gives excellent effort despite what his LT LE can do. Left knee & hip fatigue rather quickly, but pt can do 2 sets of each exercise with the same load as last time.pt reports walking 7-8 min/day for exercise.    Pt will benefit from skilled therapeutic intervention in order to improve on the following deficits Abnormal gait;Difficulty walking;Decreased activity tolerance;Decreased endurance;Decreased strength   Rehab Potential Good   Clinical Impairments Affecting Rehab Potential CVA in 2008; hemiparesis of left   PT Frequency 2x / week   PT Duration 8 weeks   PT Treatment/Interventions Passive range of motion;Patient/family education;Manual techniques;Functional mobility training;Therapeutic activities;Therapeutic exercise;Stair training;Gait training   PT Next Visit Plan strengthing of left hip and knee, stair training, gait training, endurance training   PT Home Exercise Plan progress as needed   Consulted and Agree with Plan of Care --        Problem List Patient Active Problem List   Diagnosis Date Noted  . Acute pyelonephritis 09/29/2012  . Nausea 09/29/2012  . Fever 09/29/2012  . HTN (hypertension) 09/29/2012  . H/O: CVA (cerebrovascular accident) 09/29/2012  . Hearing loss 09/29/2012    Jehad Bisono, PTA 01/29/2016, 4:11 PM  [facility] [address] [place], [place], [address] Phone: [phone]   Fax:  [phone]  Name: Tolga Volkman MRN: [id number] Date of Birth: 12/30/1945

## 2016-02-03 ENCOUNTER — Ambulatory Visit: Payer: Medicare Other | Admitting: Physical Therapy

## 2016-02-03 ENCOUNTER — Encounter: Payer: Self-pay | Admitting: Physical Therapy

## 2016-02-03 DIAGNOSIS — R531 Weakness: Secondary | ICD-10-CM

## 2016-02-03 DIAGNOSIS — R269 Unspecified abnormalities of gait and mobility: Secondary | ICD-10-CM | POA: Diagnosis not present

## 2016-02-03 DIAGNOSIS — M6281 Muscle weakness (generalized): Secondary | ICD-10-CM

## 2016-02-03 DIAGNOSIS — R2689 Other abnormalities of gait and mobility: Secondary | ICD-10-CM | POA: Diagnosis not present

## 2016-02-03 DIAGNOSIS — R29898 Other symptoms and signs involving the musculoskeletal system: Secondary | ICD-10-CM | POA: Diagnosis not present

## 2016-02-03 NOTE — Patient Instructions (Signed)
    Start with feet about shoulder width apart. Step to the side with one foot, then bring the other over so feet are back to shoulder width apart.  Repeat second set with opposite foot as the lead foot.  Do at counter. 2 times one way and 2 times other way.     Standing at your counter top, place right heel ahead of  left toes.  Use the counter top for safey/balance if needed. Hold 15 sec.  2 times each way. 1 time per day.   [facility] [address] [place], [place] [address] Phone # [phone] Fax [phone]

## 2016-02-03 NOTE — Therapy (Signed)
[facility] [address] [place], [place], [address] Phone: [phone]   Fax:  [phone]  Physical Therapy Treatment  Patient Details  Name: Robert [name] MRN: [id number] Date of Birth: Jul 12, 1946 Referring Provider: Dr. [name]  Encounter Date: 02/03/2016      PT End of Session - 02/03/16 1612    Visit Number 4   Number of Visits 10   Date for PT Re-Evaluation 03/11/16   Authorization Type G-code 10th visit, Kx modifier on 15th visit   PT Start Time 1530   PT Stop Time 1612   PT Time Calculation (min) 42 min   Activity Tolerance Patient tolerated treatment well   Behavior During Therapy [facility] for tasks assessed/performed      Past Medical History  Diagnosis Date  . Hypertension   . GERD (gastroesophageal reflux disease)   . Hyperlipidemia   . Gross hematuria   . Urethral stricture   . S/P insertion of IVC (inferior vena caval) filter     2008  . History of DVT of lower extremity     2008--  cva  . History of acute pyelonephritis     10-13-2012  . Frequency of urination   . Urgency of urination   . Wears glasses   . Wears hearing aid     bilateral-- wears intermittantly  . History of CVA with residual deficit     2008--  left side of body weakness and foot drop (wears leg brace and uses cane)  . Weakness of left side of body     secondary to cva 2008  . Left foot drop     secondary to cva 2008    Past Surgical History  Procedure Laterality Date  . Ivc filter placement ([facility] hx)  2008  . Cystoscopy with urethral dilatation N/A 10/23/2015    Procedure: CYSTOSCOPY WITH URETHRAL BALLOON DILATATION;  Surgeon: [name], MD;  Location[facility];  Service: Urology;  Laterality: N/A;  BALLOON DILATION   . Cystoscopy with retrograde urethrogram N/A 10/23/2015    Procedure: CYSTOSCOPY WITH RETROGRADE URETHROGRAM;  Surgeon: [name], MD;  Location: [facility];  Service: Urology;  Laterality: N/A;    There were no vitals filed for this visit.      Subjective Assessment - 02/03/16 1553    Subjective My left leg feels stronger.  I have not tried the steps.  I walk better.    Patient Stated Goals walk longer at the store and stairs.    Currently in Pain? No/denies            [facility] PT Assessment - 02/03/16 0001    Strength   Left Knee Extension 3+/5                     [facility] Adult PT Treatment/Exercise - 02/03/16 0001    Knee/Hip Exercises: Aerobic   Nustep L1 x 8 min   Used RTUE   Knee/Hip Exercises: Standing   Other Standing Knee Exercises stand with one foot ahead of other  hold 15 sec 2 times each side with supervision   Other Standing Knee Exercises sidestepping 8 feet 2 x to right and left   Knee/Hip Exercises: Seated   Long Arc Quad Right;20 reps   Long Arc Quad Weight 2 lbs.   Marching Right;20 reps  left with assistance weight   Marching Weights 2 lbs.  on right, assistance on left  PT Education - 02/03/16 1610    Education provided Yes   Education Details tandem stance, side stepping   Person(s) Educated Patient   Methods Explanation;Demonstration;Verbal cues;Handout   Comprehension Returned demonstration;Verbalized understanding          PT Short Term Goals - 02/03/16 1615    PT SHORT TERM GOAL #1   Title walk for 20 minutes in the park due to increased endurance with 2 rests   Time 4   Period Weeks   Status On-going   PT SHORT TERM GOAL #2   Title go down steps with minimal assistance to guide his left leg due to increased strength   Period Weeks   Status On-going           PT Long Term Goals - 02/03/16 1615    PT LONG TERM GOAL #1   Title go down stairs without crossing left leg in front of right leg   Time 8   Period Weeks   Status New   PT LONG TERM GOAL #2   Title go down stairs with wife supervising him and not helping left leg   Time 8    Period Weeks   Status New   PT LONG TERM GOAL #3   Title walk in park for 45 min with 2 rests using the cane   Time 8   Period Weeks   Status New   PT LONG TERM GOAL #4   Title walk in the store to [employer] for 30 minutes due to increased endurance   Time 8   Period Weeks   Status New   PT LONG TERM GOAL #5   Title independent with HEP with some assistance with his wife.    Time 8   Period Weeks   Status New               Plan - 02/03/16 1612    Clinical Impression Statement Patient is walking 7-8 min in park but has better clearance of left foot and stands straighter.  Patient has not been doing steps at home.  Patient  left knee strength has increased to 3+/5.  Patient is able to stand with foot ahead of other for 15 seconds with supervision now.  Patient does not have to take as many rests in therapy.  Patient will benefit form phsyical therapy to improve endurance and strength and gait.    Rehab Potential Good   Clinical Impairments Affecting Rehab Potential CVA in 2008; hemiparesis of left   PT Frequency 2x / week   PT Duration 8 weeks   PT Treatment/Interventions Passive range of motion;Patient/family education;Manual techniques;Functional mobility training;Therapeutic activities;Therapeutic exercise;Stair training;Gait training   PT Next Visit Plan strengthing of left hip and knee, stair training, gait training, endurance training   PT Home Exercise Plan progress as needed   Consulted and Agree with Plan of Care Patient;Family member/caregiver   Family Member Consulted wife      Patient will benefit from skilled therapeutic intervention in order to improve the following deficits and impairments:  Abnormal gait, Difficulty walking, Decreased activity tolerance, Decreased endurance, Decreased strength  Visit Diagnosis: Muscle weakness (generalized) - Plan: PT plan of care cert/re-cert  Weakness - Plan: PT plan of care cert/re-cert     Problem List Patient  Active Problem List   Diagnosis Date Noted  . Acute pyelonephritis 09/29/2012  . Nausea 09/29/2012  . Fever 09/29/2012  . HTN (hypertension) 09/29/2012  . H/O: CVA (cerebrovascular accident) 09/29/2012  .  Hearing loss 09/29/2012    Earlie Counts, PT 02/03/2016 4:21 PM    [facility] [address] [place], [place], [address] Phone: [phone]   Fax:  [phone]  Name: Robert [name] MRN: [id number] Date of Birth: 1945/11/08

## 2016-02-05 ENCOUNTER — Ambulatory Visit: Payer: Medicare Other

## 2016-02-05 DIAGNOSIS — R531 Weakness: Secondary | ICD-10-CM | POA: Diagnosis not present

## 2016-02-05 DIAGNOSIS — R2689 Other abnormalities of gait and mobility: Secondary | ICD-10-CM | POA: Diagnosis not present

## 2016-02-05 DIAGNOSIS — R269 Unspecified abnormalities of gait and mobility: Secondary | ICD-10-CM | POA: Diagnosis not present

## 2016-02-05 DIAGNOSIS — M6281 Muscle weakness (generalized): Secondary | ICD-10-CM | POA: Diagnosis not present

## 2016-02-05 DIAGNOSIS — R29898 Other symptoms and signs involving the musculoskeletal system: Secondary | ICD-10-CM | POA: Diagnosis not present

## 2016-02-05 NOTE — Therapy (Signed)
[facility] [address] [place], [place], [address] Phone: [phone]   Fax:  [phone]  Physical Therapy Treatment  Patient Details  Name: Robert [name] MRN: [id number] Date of Birth: January 04, 1946 Referring Provider: Dr. [name]  Encounter Date: 02/05/2016      PT End of Session - 02/05/16 1606    Visit Number 5   Number of Visits 10   Date for PT Re-Evaluation 03/11/16   Authorization Type G-code 10th visit, Kx modifier on 15th visit   PT Start Time 1520   PT Stop Time 1606   PT Time Calculation (min) 46 min   Activity Tolerance Patient tolerated treatment well   Behavior During Therapy [facility] for tasks assessed/performed      Past Medical History  Diagnosis Date  . Hypertension   . GERD (gastroesophageal reflux disease)   . Hyperlipidemia   . Gross hematuria   . Urethral stricture   . S/P insertion of IVC (inferior vena caval) filter     2008  . History of DVT of lower extremity     2008--  cva  . History of acute pyelonephritis     10-13-2012  . Frequency of urination   . Urgency of urination   . Wears glasses   . Wears hearing aid     bilateral-- wears intermittantly  . History of CVA with residual deficit     2008--  left side of body weakness and foot drop (wears leg brace and uses cane)  . Weakness of left side of body     secondary to cva 2008  . Left foot drop     secondary to cva 2008    Past Surgical History  Procedure Laterality Date  . Ivc filter placement ([facility] hx)  2008  . Cystoscopy with urethral dilatation N/A 10/23/2015    Procedure: CYSTOSCOPY WITH URETHRAL BALLOON DILATATION;  Surgeon: [name], MD;  Location: [facility];  Service: Urology;  Laterality: N/A;  BALLOON DILATION   . Cystoscopy with retrograde urethrogram N/A 10/23/2015    Procedure: CYSTOSCOPY WITH RETROGRADE URETHROGRAM;  Surgeon: [name], MD;  Location: [facility];  Service: Urology;  Laterality: N/A;    There were no vitals filed for this visit.      Subjective Assessment - 02/05/16 1536    Subjective Pt hasn't been walking at the park this week.  Pt feels like he is walking better and legs are getting stronger.     Currently in Pain? No/denies                         [facility] Adult PT Treatment/Exercise - 02/05/16 0001    Knee/Hip Exercises: Aerobic   Nustep L1 x 8 min   Used RTUE   Knee/Hip Exercises: Standing   Hip Flexion AROM;Left;2 sets;10 reps;Knee bent   Other Standing Knee Exercises stand with one foot ahead of other  hold 15 sec 2 times each side with supervision   Other Standing Knee Exercises sidestepping 8 feet 2 x to right and left   Knee/Hip Exercises: Seated   Long Arc Quad Right;20 reps   Long Arc Quad Weight 2 lbs.   Ball Squeeze 20x    Marching Right;20 reps  left with assistance weight   Marching Weights 2 lbs.  on right, assistance on left  PT Short Term Goals - 02/03/16 1615    PT SHORT TERM GOAL #1   Title walk for 20 minutes in the park due to increased endurance with 2 rests   Time 4   Period Weeks   Status On-going   PT SHORT TERM GOAL #2   Title go down steps with minimal assistance to guide his left leg due to increased strength   Period Weeks   Status On-going           PT Long Term Goals - 02/03/16 1615    PT LONG TERM GOAL #1   Title go down stairs without crossing left leg in front of right leg   Time 8   Period Weeks   Status New   PT LONG TERM GOAL #2   Title go down stairs with wife supervising him and not helping left leg   Time 8   Period Weeks   Status New   PT LONG TERM GOAL #3   Title walk in park for 45 min with 2 rests using the cane   Time 8   Period Weeks   Status New   PT LONG TERM GOAL #4   Title walk in the store to [employer] for 30 minutes due to increased endurance   Time 8   Period Weeks   Status New   PT LONG  TERM GOAL #5   Title independent with HEP with some assistance with his wife.    Time 8   Period Weeks   Status New               Plan - 02/05/16 1534    Clinical Impression Statement Pt is walking 7-8 minutes in the park and has improved foot clearance and posture.  Lt knee strength has increased to 3+/5 and pt is able to stand in tandem stance with supervision for 15 seconds.  Pt is taking fewer breaks in therapy.  Pt will continue to benefit from skilled PT to imrpove endurance and strength and improve gait.     Rehab Potential Good   Clinical Impairments Affecting Rehab Potential CVA in 2008; hemiparesis of left   PT Frequency 2x / week   PT Duration 8 weeks   PT Treatment/Interventions Passive range of motion;Patient/family education;Manual techniques;Functional mobility training;Therapeutic activities;Therapeutic exercise;Stair training;Gait training   PT Next Visit Plan strengthing of left hip and knee, stair training, gait training, endurance training      Patient will benefit from skilled therapeutic intervention in order to improve the following deficits and impairments:  Abnormal gait, Difficulty walking, Decreased activity tolerance, Decreased endurance, Decreased strength  Visit Diagnosis: Muscle weakness (generalized)     Problem List Patient Active Problem List   Diagnosis Date Noted  . Acute pyelonephritis 09/29/2012  . Nausea 09/29/2012  . Fever 09/29/2012  . HTN (hypertension) 09/29/2012  . H/O: CVA (cerebrovascular accident) 09/29/2012  . Hearing loss 09/29/2012   Sigurd Sos, PT 02/05/2016 4:07 PM  [facility] [address], [place], [place], [address] Phone: [phone]   Fax:  [phone]  Name: Robert [name] MRN: [id number] Date of Birth: Apr 15, 1946

## 2016-02-10 ENCOUNTER — Ambulatory Visit: Payer: Medicare Other | Admitting: Physical Therapy

## 2016-02-10 ENCOUNTER — Telehealth: Payer: Self-pay

## 2016-02-10 ENCOUNTER — Ambulatory Visit: Payer: Medicare Other

## 2016-02-10 ENCOUNTER — Encounter: Payer: Self-pay | Admitting: Physical Therapy

## 2016-02-10 DIAGNOSIS — M6281 Muscle weakness (generalized): Secondary | ICD-10-CM

## 2016-02-10 DIAGNOSIS — R269 Unspecified abnormalities of gait and mobility: Secondary | ICD-10-CM | POA: Diagnosis not present

## 2016-02-10 DIAGNOSIS — R29898 Other symptoms and signs involving the musculoskeletal system: Secondary | ICD-10-CM | POA: Diagnosis not present

## 2016-02-10 DIAGNOSIS — R2689 Other abnormalities of gait and mobility: Secondary | ICD-10-CM | POA: Diagnosis not present

## 2016-02-10 DIAGNOSIS — R531 Weakness: Secondary | ICD-10-CM | POA: Diagnosis not present

## 2016-02-10 NOTE — Therapy (Signed)
[facility] [address], [place], [place], [address] Phone: [phone]   Fax:  [phone]  Physical Therapy Treatment  Patient Details  Name: Robert [name] MRN: [id number] Date of Birth: 10-09-1946 Referring Provider: Dr. [name]  Encounter Date: 02/10/2016      PT End of Session - 02/10/16 1542    Visit Number 6   Number of Visits 10   Date for PT Re-Evaluation 03/11/16   Authorization Type G-code 10th visit, Kx modifier on 15th visit   PT Start Time 1540  Pt arrived late   PT Stop Time 1626   PT Time Calculation (min) 46 min   Activity Tolerance Patient tolerated treatment well   Behavior During Therapy [facility] for tasks assessed/performed      Past Medical History  Diagnosis Date  . Hypertension   . GERD (gastroesophageal reflux disease)   . Hyperlipidemia   . Gross hematuria   . Urethral stricture   . S/P insertion of IVC (inferior vena caval) filter     2008  . History of DVT of lower extremity     2008--  cva  . History of acute pyelonephritis     10-13-2012  . Frequency of urination   . Urgency of urination   . Wears glasses   . Wears hearing aid     bilateral-- wears intermittantly  . History of CVA with residual deficit     2008--  left side of body weakness and foot drop (wears leg brace and uses cane)  . Weakness of left side of body     secondary to cva 2008  . Left foot drop     secondary to cva 2008    Past Surgical History  Procedure Laterality Date  . Ivc filter placement ([facility] hx)  2008  . Cystoscopy with urethral dilatation N/A 10/23/2015    Procedure: CYSTOSCOPY WITH URETHRAL BALLOON DILATATION;  Surgeon: [name], MD;  Location: [facility];  Service: Urology;  Laterality: N/A;  BALLOON DILATION   . Cystoscopy with retrograde urethrogram N/A 10/23/2015    Procedure: CYSTOSCOPY WITH RETROGRADE URETHROGRAM;  Surgeon: [name], MD;  Location: [facility];  Service: Urology;  Laterality: N/A;    There were no vitals filed for this visit.      Subjective Assessment - 02/10/16 1541    Subjective Pt is using his SPC on Rt side. Pt reports feeling improvement with his strength    Patient is accompained by: Family member   Patient Stated Goals walk longer at the store and stairs.    Currently in Pain? No/denies                         [facility] Adult PT Treatment/Exercise - 02/10/16 0001    Ambulation/Gait   Ambulation/Gait Yes   Ambulation/Gait Assistance 6: Modified independent (Device/Increase time);5: Supervision   Ambulation Distance (Feet) 50 Feet   Assistive device Straight cane   Gait Pattern Decreased arm swing - left;Decreased step length - left;Decreased stance time - left;Decreased stride length;Decreased hip/knee flexion - left;Decreased dorsiflexion - left   Ambulation Surface Level   Knee/Hip Exercises: Aerobic   Nustep L1 x 8 min , used Rt UE   Knee/Hip Exercises: Standing   Forward Step Up Left;2 sets;10 reps;Step Height: 6"  with activation on left ilium, challenging for pt   Other Standing Knee Exercises stride stance Rt foot in front 15  sec x 2// hip elevation x 10   Other Standing Knee Exercises sidestepping 8 feet 2 x to right and left  with 1 UE support and vc's for Lt foot position   Knee/Hip Exercises: Seated   Long Arc Quad Right;20 reps   Long Arc Quad Weight 2 lbs.   Ball Squeeze 20x    Marching Right;20 reps   [employer] 2 lbs.  assist on left                  PT Short Term Goals - 02/10/16 1544    PT SHORT TERM GOAL #1   Title walk for 20 minutes in the park due to increased endurance with 2 rests   Time 4   Period Weeks   Status On-going   PT SHORT TERM GOAL #2   Title go down steps with minimal assistance to guide his left leg due to increased strength   Time 4   Period Weeks   Status On-going           PT Long Term Goals - 02/10/16  1545    PT LONG TERM GOAL #1   Title go down stairs without crossing left leg in front of right leg   Time 8   Period Weeks   Status On-going   PT LONG TERM GOAL #2   Title go down stairs with wife supervising him and not helping left leg   Time 8   Period Weeks   Status On-going   PT LONG TERM GOAL #3   Title walk in park for 45 min with 2 rests using the cane   Time 8   Period Weeks   Status On-going   PT LONG TERM GOAL #4   Title walk in the store to [employer] for 30 minutes due to increased endurance   Time 8   Period Weeks   Status On-going   PT LONG TERM GOAL #5   Title independent with HEP with some assistance with his wife.    Time 8   Period Weeks   Status On-going               Plan - 02/10/16 1543    Clinical Impression Statement Pt has difficulties clearing his Lt foot on 6 inch step, stimulated on Lt pelvic area for pelvic elevation. Pt able to work thru session with sitting exercises b/w standing activities. Pt will continue to benefit from skilled PT to improve endurance and strengtrh and improve gait.    Rehab Potential Good   Clinical Impairments Affecting Rehab Potential CVA in 2008; hemiparesis of left   PT Frequency 2x / week   PT Duration 8 weeks   PT Treatment/Interventions Passive range of motion;Patient/family education;Manual techniques;Functional mobility training;Therapeutic activities;Therapeutic exercise;Stair training;Gait training   PT Next Visit Plan strengthing of left hip and knee, stair training, gait training, endurance training   PT Home Exercise Plan progress as needed   Consulted and Agree with Plan of Care Patient   Family Member Consulted wife      Patient will benefit from skilled therapeutic intervention in order to improve the following deficits and impairments:  Abnormal gait, Difficulty walking, Decreased activity tolerance, Decreased endurance, Decreased strength  Visit Diagnosis: Muscle weakness (generalized)  Other  abnormalities of gait and mobility     Problem List Patient Active Problem List   Diagnosis Date Noted  . Acute pyelonephritis 09/29/2012  . Nausea 09/29/2012  . Fever 09/29/2012  . HTN (  hypertension) 09/29/2012  . H/O: CVA (cerebrovascular accident) 09/29/2012  . Hearing loss 09/29/2012    Rivka Barbara, PTA 02/10/2016 4:33 PM   [facility] [address], [place], [place], [address] Phone: [phone]   Fax:  [phone]  Name: Robert [name] MRN: [id number] Date of Birth: [birth year]

## 2016-02-10 NOTE — Telephone Encounter (Signed)
PT called pt due to no-show appointment.  PT left message to call back.

## 2016-02-12 ENCOUNTER — Encounter: Payer: Medicare Other | Admitting: Physical Therapy

## 2016-02-17 ENCOUNTER — Encounter: Payer: Medicare Other | Admitting: Physical Therapy

## 2016-02-19 ENCOUNTER — Encounter: Payer: Self-pay | Admitting: Physical Therapy

## 2016-02-19 ENCOUNTER — Ambulatory Visit: Payer: Medicare Other | Admitting: Physical Therapy

## 2016-02-19 DIAGNOSIS — R531 Weakness: Secondary | ICD-10-CM | POA: Diagnosis not present

## 2016-02-19 DIAGNOSIS — R269 Unspecified abnormalities of gait and mobility: Secondary | ICD-10-CM | POA: Diagnosis not present

## 2016-02-19 DIAGNOSIS — R2689 Other abnormalities of gait and mobility: Secondary | ICD-10-CM

## 2016-02-19 DIAGNOSIS — M6281 Muscle weakness (generalized): Secondary | ICD-10-CM | POA: Diagnosis not present

## 2016-02-19 DIAGNOSIS — R29898 Other symptoms and signs involving the musculoskeletal system: Secondary | ICD-10-CM | POA: Diagnosis not present

## 2016-02-19 NOTE — Therapy (Signed)
[facility] [address], [place], [place], [address] Phone: [phone]   Fax:  [phone]  Physical Therapy Treatment  Patient Details  Name: Robert [name] MRN: [id number] Date of Birth: Apr 15, 1946 Referring Provider: Dr. [name]  Encounter Date: 02/19/2016      PT End of Session - 02/19/16 1530    Visit Number 7   Number of Visits 10   Date for PT Re-Evaluation 03/11/16   Authorization Type G-code 10th visit, Kx modifier on 15th visit   PT Start Time 1526   PT Stop Time 1610   PT Time Calculation (min) 44 min   Activity Tolerance Patient tolerated treatment well   Behavior During Therapy [facility] for tasks assessed/performed      Past Medical History  Diagnosis Date  . Hypertension   . GERD (gastroesophageal reflux disease)   . Hyperlipidemia   . Gross hematuria   . Urethral stricture   . S/P insertion of IVC (inferior vena caval) filter     2008  . History of DVT of lower extremity     2008--  cva  . History of acute pyelonephritis     10-13-2012  . Frequency of urination   . Urgency of urination   . Wears glasses   . Wears hearing aid     bilateral-- wears intermittantly  . History of CVA with residual deficit     2008--  left side of body weakness and foot drop (wears leg brace and uses cane)  . Weakness of left side of body     secondary to cva 2008  . Left foot drop     secondary to cva 2008    Past Surgical History  Procedure Laterality Date  . Ivc filter placement ([facility] hx)  2008  . Cystoscopy with urethral dilatation N/A 10/23/2015    Procedure: CYSTOSCOPY WITH URETHRAL BALLOON DILATATION;  Surgeon: [name], MD;  Location: [facility];  Service: Urology;  Laterality: N/A;  BALLOON DILATION   . Cystoscopy with retrograde urethrogram N/A 10/23/2015    Procedure: CYSTOSCOPY WITH RETROGRADE URETHROGRAM;  Surgeon: [name], MD;  Location: [facility];  Service: Urology;  Laterality: N/A;    There were no vitals filed for this visit.      Subjective Assessment - 02/19/16 1529    Subjective pt reports he is getting around with greater ease today than he was a month ago.    Currently in Pain? No/denies                         [facility] Adult PT Treatment/Exercise - 02/19/16 0001    Ambulation/Gait   Ambulation Distance (Feet) 240 Feet   Assistive device Straight cane   Gait Pattern Decreased arm swing - left;Decreased step length - left;Decreased stance time - left;Decreased stride length;Decreased hip/knee flexion - left;Decreased dorsiflexion - left   Ambulation Surface Level   Knee/Hip Exercises: Aerobic   Nustep L1 x 10 min   Knee/Hip Exercises: Standing   Hip Flexion Stengthening;Right;2 sets;10 reps;Knee bent   Stairs Up & down 1 x 4 steps: minimal assistance to keep LTLE under his hip and not adduct   Knee/Hip Exercises: Seated   Long Arc Quad Right;20 reps  Only one set on LTLE due to pinching in his back.   Long Arc Quad Weight 2 lbs.  Took weight off LT as this was producing a pinch in his  back   [facility] 30x   Marching Right;20 reps  Only 1 set on LT due to pinching in his back   Marching Weights 2 lbs.   Sit to [employer] 10 reps                  PT Short Term Goals - 02/19/16 1531    PT SHORT TERM GOAL #1   Title walk for 20 minutes in the park due to increased endurance with 2 rests   Time 4   Period Weeks   Status Achieved   PT SHORT TERM GOAL #2   Title go down steps with minimal assistance to guide his left leg due to increased strength   Time 4   Period Weeks   Status Achieved  Wife guides his LT leg           PT Long Term Goals - 02/10/16 1545    PT LONG TERM GOAL #1   Title go down stairs without crossing left leg in front of right leg   Time 8   Period Weeks   Status On-going   PT LONG TERM GOAL #2   Title go down stairs with wife supervising him and not  helping left leg   Time 8   Period Weeks   Status On-going   PT LONG TERM GOAL #3   Title walk in park for 45 min with 2 rests using the cane   Time 8   Period Weeks   Status On-going   PT LONG TERM GOAL #4   Title walk in the store to [employer] for 30 minutes due to increased endurance   Time 8   Period Weeks   Status On-going   PT LONG TERM GOAL #5   Title independent with HEP with some assistance with his wife.    Time 8   Period Weeks   Status On-going               Plan - 02/19/16 1559    Clinical Impression Statement Pt reports walking at park when it is not raining with his wife, at [place] 20 min and no rest breaks, meeting goal. He reports with the assistance of his wife, going down stairs at home has become much easier.  He walked 240 feet in  the clinic today.   Rehab Potential Good   Clinical Impairments Affecting Rehab Potential CVA in 2008; hemiparesis of left   PT Frequency 2x / week   PT Duration 8 weeks   PT Treatment/Interventions Passive range of motion;Patient/family education;Manual techniques;Functional mobility training;Therapeutic activities;Therapeutic exercise;Stair training;Gait training   PT Next Visit Plan strengthing of left hip and knee, stair training, gait training, endurance training   Consulted and Agree with Plan of Care Patient   Family Member Consulted wife      Patient will benefit from skilled therapeutic intervention in order to improve the following deficits and impairments:  Abnormal gait, Difficulty walking, Decreased activity tolerance, Decreased endurance, Decreased strength  Visit Diagnosis: Muscle weakness (generalized)  Other abnormalities of gait and mobility     Problem List Patient Active Problem List   Diagnosis Date Noted  . Acute pyelonephritis 09/29/2012  . Nausea 09/29/2012  . Fever 09/29/2012  . HTN (hypertension) 09/29/2012  . H/O: CVA (cerebrovascular accident) 09/29/2012  . Hearing loss 09/29/2012     COCHRAN,JENNIFER, PTA 02/19/2016, 4:13 PM  [facility] [address], [place], [place], [address] Phone:  [phone]   Fax:  [phone]  Name: Robert [name] MRN: [id number] Date of Birth: 11/10/1945

## 2016-02-24 ENCOUNTER — Encounter: Payer: Self-pay | Admitting: Physical Therapy

## 2016-02-24 ENCOUNTER — Ambulatory Visit: Payer: Medicare Other | Attending: Family Medicine | Admitting: Physical Therapy

## 2016-02-24 DIAGNOSIS — R531 Weakness: Secondary | ICD-10-CM | POA: Diagnosis not present

## 2016-02-24 DIAGNOSIS — R2689 Other abnormalities of gait and mobility: Secondary | ICD-10-CM | POA: Diagnosis not present

## 2016-02-24 DIAGNOSIS — R29898 Other symptoms and signs involving the musculoskeletal system: Secondary | ICD-10-CM | POA: Diagnosis not present

## 2016-02-24 DIAGNOSIS — M6281 Muscle weakness (generalized): Secondary | ICD-10-CM | POA: Diagnosis not present

## 2016-02-24 DIAGNOSIS — R269 Unspecified abnormalities of gait and mobility: Secondary | ICD-10-CM | POA: Diagnosis not present

## 2016-02-24 NOTE — Therapy (Signed)
[facility] [address], [place], [place], [address] Phone: [phone]   Fax:  [phone]  Physical Therapy Treatment  Patient Details  Name: Robert [name] MRN: [id number] Date of Birth: 1946-04-20 Referring Provider: Dr. [name]  Encounter Date: 02/24/2016      PT End of Session - 02/24/16 1609    Visit Number 8   Number of Visits 10   Date for PT Re-Evaluation 03/11/16   Authorization Type G-code 10th visit, Kx modifier on 15th visit   PT Start Time 1530   PT Stop Time 1610   PT Time Calculation (min) 40 min   Activity Tolerance Patient tolerated treatment well   Behavior During Therapy [facility] for tasks assessed/performed      Past Medical History  Diagnosis Date  . Hypertension   . GERD (gastroesophageal reflux disease)   . Hyperlipidemia   . Gross hematuria   . Urethral stricture   . S/P insertion of IVC (inferior vena caval) filter     2008  . History of DVT of lower extremity     2008--  cva  . History of acute pyelonephritis     10-13-2012  . Frequency of urination   . Urgency of urination   . Wears glasses   . Wears hearing aid     bilateral-- wears intermittantly  . History of CVA with residual deficit     2008--  left side of body weakness and foot drop (wears leg brace and uses cane)  . Weakness of left side of body     secondary to cva 2008  . Left foot drop     secondary to cva 2008    Past Surgical History  Procedure Laterality Date  . Ivc filter placement ([facility] hx)  2008  . Cystoscopy with urethral dilatation N/A 10/23/2015    Procedure: CYSTOSCOPY WITH URETHRAL BALLOON DILATATION;  Surgeon: [name], MD;  Location: [facility];  Service: Urology;  Laterality: N/A;  BALLOON DILATION   . Cystoscopy with retrograde urethrogram N/A 10/23/2015    Procedure: CYSTOSCOPY WITH RETROGRADE URETHROGRAM;  Surgeon: [name], MD;  Location: [facility];  Service: Urology;  Laterality: N/A;    There were no vitals filed for this visit.      Subjective Assessment - 02/24/16 1536    Subjective I can do the nustep for 10 min. without being tired.    Patient Stated Goals walk longer at the store and stairs.    Currently in Pain? No/denies            [facility] PT Assessment - 02/24/16 0001    Strength   Left Hip Flexion 3/5   Left Hip Extension 3-/5   Left Hip External Rotation 3+/5   Left Hip ABduction 3-/5   Left Knee Flexion 4/5   Left Knee Extension 4-/5                     [facility] Adult PT Treatment/Exercise - 02/24/16 0001    Knee/Hip Exercises: Standing   Stairs Up & down 2 x 4 steps:only gave patien supervision   Other Standing Knee Exercises stand and reach for cones at different angles with right hand to challenge balance   Other Standing Knee Exercises stand on right foot and kick ball to side with left foot 15 times   Knee/Hip Exercises: Seated   Long Arc Quad Right;20 reps  sat on high low mat  with pillows behind back   Long Arc Quad Weight 3 lbs.   Long Arc Quad Limitations left 10x3 2#   Marching Right;20 reps  Only 1 set on LT due to pinching in his back   Marching Limitations therapist gave assistance on left, no weight on left   Marching Weights 2 lbs.  on right   Sit to [place] 10 reps                PT Education - 02/24/16 1608    Education provided No          PT Short Term Goals - 02/19/16 1531    PT SHORT TERM GOAL #1   Title walk for 20 minutes in the park due to increased endurance with 2 rests   Time 4   Period Weeks   Status Achieved   PT SHORT TERM GOAL #2   Title go down steps with minimal assistance to guide his left leg due to increased strength   Time 4   Period Weeks   Status Achieved  Wife guides his LT leg           PT Long Term Goals - 02/24/16 1537    PT LONG TERM GOAL #1   Title go down stairs without crossing left leg in front of right leg    Time 8   Period Weeks   Status On-going   PT LONG TERM GOAL #2   Title go down stairs with wife supervising him and not helping left leg   Time 8   Period Weeks   Status Achieved   PT LONG TERM GOAL #3   Title walk in park for 45 min with 2 rests using the cane   Time 8   Period Weeks   Status On-going  walking 15 min.    PT LONG TERM GOAL #4   Title walk in the store to [employer] for 30 minutes due to increased endurance   Time 8   Period Weeks   Status On-going  15 min   PT LONG TERM GOAL #5   Title independent with HEP with some assistance with his wife.    Time 8   Period Weeks   Status On-going               Plan - 02/24/16 1609    Clinical Impression Statement Patient is able to come down the stairs with left leg not crossing the right and only with supervision.  Patient is able to extend left knee without weight.  Patient needs asistance to flex left hip at endrange. Patient has increased strength of left leg.  Patient will benefit from physical therapy to increase  strength and improve gait.    Rehab Potential Good   Clinical Impairments Affecting Rehab Potential CVA in 2008; hemiparesis of left   PT Frequency 2x / week   PT Duration 8 weeks   PT Treatment/Interventions Passive range of motion;Patient/family education;Manual techniques;Functional mobility training;Therapeutic activities;Therapeutic exercise;Stair training;Gait training   PT Next Visit Plan strengthing of left hip and knee, stair training, gait training, endurance training; work in [place] progress as needed   Consulted and Agree with Plan of Care Patient   Family Member Consulted wife      Patient will benefit from skilled therapeutic intervention in order to improve the following deficits and impairments:  Abnormal gait, Difficulty walking, Decreased activity tolerance, Decreased endurance, Decreased strength  Visit Diagnosis: Muscle  weakness  (generalized)  Other abnormalities of gait and mobility     Problem List Patient Active Problem List   Diagnosis Date Noted  . Acute pyelonephritis 09/29/2012  . Nausea 09/29/2012  . Fever 09/29/2012  . HTN (hypertension) 09/29/2012  . H/O: CVA (cerebrovascular accident) 09/29/2012  . Hearing loss 09/29/2012    Earlie Counts, PT 02/24/2016 4:13 PM   [facility] [address], [place], [place], [address] Phone: [phone]   Fax:  [phone]  Name: Robert [name] MRN: [id number] Date of Birth: Nov 07, 1945

## 2016-02-26 ENCOUNTER — Ambulatory Visit: Payer: Medicare Other | Admitting: Physical Therapy

## 2016-02-26 ENCOUNTER — Encounter: Payer: Self-pay | Admitting: Physical Therapy

## 2016-02-26 DIAGNOSIS — M6281 Muscle weakness (generalized): Secondary | ICD-10-CM

## 2016-02-26 DIAGNOSIS — R29898 Other symptoms and signs involving the musculoskeletal system: Secondary | ICD-10-CM | POA: Diagnosis not present

## 2016-02-26 DIAGNOSIS — R2689 Other abnormalities of gait and mobility: Secondary | ICD-10-CM | POA: Diagnosis not present

## 2016-02-26 DIAGNOSIS — R269 Unspecified abnormalities of gait and mobility: Secondary | ICD-10-CM | POA: Diagnosis not present

## 2016-02-26 DIAGNOSIS — R531 Weakness: Secondary | ICD-10-CM | POA: Diagnosis not present

## 2016-02-26 NOTE — Therapy (Signed)
[facility] [address] [place], [place], [address] Phone: [phone]   Fax:  [phone]  Physical Therapy Treatment  Patient Details  Name: Robert [name] MRN: [id number] Date of Birth: Dec 26, 1945 Referring Provider: Dr. [name]  Encounter Date: 02/26/2016      PT End of Session - 02/26/16 1612    Visit Number 9   Number of Visits 10   Date for PT Re-Evaluation 03/11/16   Authorization Type G-code 10th visit, Kx modifier on 15th visit   PT Start Time 1530   PT Stop Time 1610   PT Time Calculation (min) 40 min   Activity Tolerance Patient tolerated treatment well   Behavior During Therapy [facility] for tasks assessed/performed      Past Medical History  Diagnosis Date  . Hypertension   . GERD (gastroesophageal reflux disease)   . Hyperlipidemia   . Gross hematuria   . Urethral stricture   . S/P insertion of IVC (inferior vena caval) filter     2008  . History of DVT of lower extremity     2008--  cva  . History of acute pyelonephritis     10-13-2012  . Frequency of urination   . Urgency of urination   . Wears glasses   . Wears hearing aid     bilateral-- wears intermittantly  . History of CVA with residual deficit     2008--  left side of body weakness and foot drop (wears leg brace and uses cane)  . Weakness of left side of body     secondary to cva 2008  . Left foot drop     secondary to cva 2008    Past Surgical History  Procedure Laterality Date  . Ivc filter placement ([facility] hx)  2008  . Cystoscopy with urethral dilatation N/A 10/23/2015    Procedure: CYSTOSCOPY WITH URETHRAL BALLOON DILATATION;  Surgeon: [name], MD;  Location: [facility];  Service: Urology;  Laterality: N/A;  BALLOON DILATION   . Cystoscopy with retrograde urethrogram N/A 10/23/2015    Procedure: CYSTOSCOPY WITH RETROGRADE URETHROGRAM;  Surgeon: [name], MD;  Location: [facility];  Service: Urology;  Laterality: N/A;    There were no vitals filed for this visit.      Subjective Assessment - 02/26/16 1540    Subjective I was fatiqued from last visit. I was able to go down stairs with my wife supervising now.    Patient is accompained by: Family member   Patient Stated Goals walk longer at the store and stairs.    Currently in Pain? No/denies                         [facility] Adult PT Treatment/Exercise - 02/26/16 0001    Knee/Hip Exercises: Aerobic   Nustep L2 x 10 min   Knee/Hip Exercises: Standing   Other Standing Knee Exercises stand and reach for cones at different angles with right hand to challenge balance   Other Standing Knee Exercises stand on right foot and kick ball to side with left foot 15 times   Knee/Hip Exercises: Seated   Long Arc Quad Right;20 reps  sat on high low mat with pillows behind back   Long Arc Quad Weight 3 lbs.   Long Arc Quad Limitations left 10x3 2#   Marching Right;20 reps  Only 1 set on LT due to pinching in his back  Marching Limitations therapist gave assistance on left, no weight on left   Marching Weights 3 lbs.  right leg   Sit to Sand 10 reps  with ball squeeze                  PT Short Term Goals - 02/19/16 1531    PT SHORT TERM GOAL #1   Title walk for 20 minutes in the park due to increased endurance with 2 rests   Time 4   Period Weeks   Status Achieved   PT SHORT TERM GOAL #2   Title go down steps with minimal assistance to guide his left leg due to increased strength   Time 4   Period Weeks   Status Achieved  Wife guides his LT leg           PT Long Term Goals - 02/24/16 1537    PT LONG TERM GOAL #1   Title go down stairs without crossing left leg in front of right leg   Time 8   Period Weeks   Status On-going   PT LONG TERM GOAL #2   Title go down stairs with wife supervising him and not helping left leg   Time 8   Period Weeks   Status Achieved   PT  LONG TERM GOAL #3   Title walk in park for 45 min with 2 rests using the cane   Time 8   Period Weeks   Status On-going  walking 15 min.    PT LONG TERM GOAL #4   Title walk in the store to [employer] for 30 minutes due to increased endurance   Time 8   Period Weeks   Status On-going  15 min   PT LONG TERM GOAL #5   Title independent with HEP with some assistance with his wife.    Time 8   Period Weeks   Status On-going               Plan - 02/26/16 1613    Clinical Impression Statement Patient has increased endurance and able to do the elliptical on L2 intead of L1. Patient able to take bigger steps with left leg due to increased control.  Patient will benefit from physcial therapy to improve strength, balance and endurance.    Rehab Potential Good   Clinical Impairments Affecting Rehab Potential CVA in 2008; hemiparesis of left   PT Frequency 2x / week   PT Duration 8 weeks   PT Treatment/Interventions Passive range of motion;Patient/family education;Manual techniques;Functional mobility training;Therapeutic activities;Therapeutic exercise;Stair training;Gait training   PT Next Visit Plan strengthing of left hip and knee, stair training, gait training, endurance training; work in [place] progress as needed   Consulted and Agree with Plan of Care Patient   Family Member Consulted wife      Patient will benefit from skilled therapeutic intervention in order to improve the following deficits and impairments:  Abnormal gait, Difficulty walking, Decreased activity tolerance, Decreased endurance, Decreased strength  Visit Diagnosis: Muscle weakness (generalized)  Other abnormalities of gait and mobility     Problem List Patient Active Problem List   Diagnosis Date Noted  . Acute pyelonephritis 09/29/2012  . Nausea 09/29/2012  . Fever 09/29/2012  . HTN (hypertension) 09/29/2012  . H/O: CVA (cerebrovascular accident) 09/29/2012  . Hearing  loss 09/29/2012    Earlie Counts, PT 02/26/2016 4:15 PM    [facility] [address]  [place], [place], [place], [address] Phone: [phone]   Fax:  [phone]  Name: Robert [name] MRN: [id number] Date of Birth: 1946-04-20

## 2016-03-03 ENCOUNTER — Ambulatory Visit: Payer: Medicare Other

## 2016-03-03 DIAGNOSIS — M6281 Muscle weakness (generalized): Secondary | ICD-10-CM | POA: Diagnosis not present

## 2016-03-03 DIAGNOSIS — R531 Weakness: Secondary | ICD-10-CM

## 2016-03-03 DIAGNOSIS — R2689 Other abnormalities of gait and mobility: Secondary | ICD-10-CM | POA: Diagnosis not present

## 2016-03-03 DIAGNOSIS — R269 Unspecified abnormalities of gait and mobility: Secondary | ICD-10-CM | POA: Diagnosis not present

## 2016-03-03 DIAGNOSIS — R29898 Other symptoms and signs involving the musculoskeletal system: Secondary | ICD-10-CM | POA: Diagnosis not present

## 2016-03-03 NOTE — Therapy (Signed)
[facility] [address] [place], [place], [address] Phone: [phone]   Fax:  [phone]  Physical Therapy Treatment  Patient Details  Name: Robert [name] MRN: [id number] Date of Birth: [birth year] Referring Provider: Dr. [name]  Encounter Date: 03/03/2016      PT End of Session - 03/03/16 1440    Visit Number 10   Number of Visits 20   Date for PT Re-Evaluation 03/11/16   Authorization Type G-code 10th visit, Kx modifier on 15th visit   PT Start Time 1359   PT Stop Time 1442   PT Time Calculation (min) 43 min   Activity Tolerance Patient tolerated treatment well   Behavior During Therapy [facility] for tasks assessed/performed      Past Medical History  Diagnosis Date  . Hypertension   . GERD (gastroesophageal reflux disease)   . Hyperlipidemia   . Gross hematuria   . Urethral stricture   . S/P insertion of IVC (inferior vena caval) filter     2008  . History of DVT of lower extremity     2008--  cva  . History of acute pyelonephritis     10-13-2012  . Frequency of urination   . Urgency of urination   . Wears glasses   . Wears hearing aid     bilateral-- wears intermittantly  . History of CVA with residual deficit     2008--  left side of body weakness and foot drop (wears leg brace and uses cane)  . Weakness of left side of body     secondary to cva 2008  . Left foot drop     secondary to cva 2008    Past Surgical History  Procedure Laterality Date  . Ivc filter placement ([facility] hx)  2008  . Cystoscopy with urethral dilatation N/A 10/23/2015    Procedure: CYSTOSCOPY WITH URETHRAL BALLOON DILATATION;  Surgeon: [name], MD;  Location: [facility];  Service: Urology;  Laterality: N/A;  BALLOON DILATION   . Cystoscopy with retrograde urethrogram N/A 10/23/2015    Procedure: CYSTOSCOPY WITH RETROGRADE URETHROGRAM;  Surgeon: [name], MD;  Location: [facility];  Service: Urology;  Laterality: N/A;    There were no vitals filed for this visit.      Subjective Assessment - 03/03/16 1412    Subjective Pt reports 25% overall improvement in mobility since the start of care.     Patient Stated Goals walk longer at the store and stairs.    Currently in Pain? No/denies            [facility] PT Assessment - 03/03/16 0001    Assessment   Medical Diagnosis G81.90 Hemiparesis; l63.9 CVA ;R25.1 Tremor; M62.81 Weakness of left side of body   Observation/Other Assessments   Focus on Therapeutic Outcomes (FOTO)  72% limitation CL  goal is 48% limitation CK                     [facility] Adult PT Treatment/Exercise - 03/03/16 0001    Knee/Hip Exercises: Aerobic   Nustep Level 3 x 10 minutes  used Rt UE, seat 12   Knee/Hip Exercises: Standing   Other Standing Knee Exercises stand and reach for cones at different angles with right hand to challenge balance   Other Standing Knee Exercises --   Knee/Hip Exercises: Seated   Long Arc Quad Right;20 reps  sat on high low mat with pillows behind back  Long Arc Quad Weight 3 lbs.   Long Arc Quad Limitations left 10x3 2#   Tourist information centre manager Limitations therapist gave assistance on left, no weight on left   Marching Weights 3 lbs.  right leg   Sit to [employer] 10 reps;2 sets  with ball squeeze                  PT Short Term Goals - 02/19/16 1531    PT SHORT TERM GOAL #1   Title walk for 20 minutes in the park due to increased endurance with 2 rests   Time 4   Period Weeks   Status Achieved   PT SHORT TERM GOAL #2   Title go down steps with minimal assistance to guide his left leg due to increased strength   Time 4   Period Weeks   Status Achieved  Wife guides his LT leg           PT Long Term Goals - 03-20-2016 1403    PT LONG TERM GOAL #1   Title go down stairs without crossing left leg in front of right leg   Time 8   Period Weeks   Status On-going    PT LONG TERM GOAL #2   Title go down stairs with wife supervising him and not helping left leg   Status Achieved   PT LONG TERM GOAL #3   Title walk in park for 45 min with 2 rests using the cane   Time 8   Period Weeks   Status Achieved   PT LONG TERM GOAL #4   Title walk in the store to [employer] for 30 minutes due to increased endurance   Status Achieved               Plan - 03/20/16 1409    Clinical Impression Statement Pt reports 25% overall improvement since the start of care regarding strength and endurance.  Pt is able to walk with his wife in the part for up to 1 hour without significant rest needed.  Pt is improving with techinque on steps and still demonstrates scissoring pattern due to Lt LE weakness.  Pt tolerated increased resistance on NuStep today and increased repetitions with sit to stand.   FOTO score is 72% limitation.  Pt will continue to benefit from skilled PT for LE strength, endurance and stair training to improve endurance and safety at home and in the community.     Rehab Potential Good   Clinical Impairments Affecting Rehab Potential CVA in 2008; hemiparesis of left   PT Frequency 2x / week   PT Duration 8 weeks   PT Treatment/Interventions Passive range of motion;Patient/family education;Manual techniques;Functional mobility training;Therapeutic activities;Therapeutic exercise;Stair training;Gait training   PT Next Visit Plan strengthing of left hip and knee, stair training, gait training, endurance training; work in standing   Consulted and Agree with Plan of Care Patient      Patient will benefit from skilled therapeutic intervention in order to improve the following deficits and impairments:  Abnormal gait, Difficulty walking, Decreased activity tolerance, Decreased endurance, Decreased strength  Visit Diagnosis: Muscle weakness (generalized)  Other abnormalities of gait and mobility  Weakness       G-Codes - 03/20/2016 1408    Functional  Assessment Tool Used FOTO score is 72% & clinical judgement   Functional Limitation Mobility: Walking and moving around   Mobility: Walking and Moving Around Current Status [id number]) At least 40 percent  but less than 60 percent impaired, limited or restricted   Mobility: Walking and Moving Around Goal Status [phone]) At least 40 percent but less than 60 percent impaired, limited or restricted      Problem List Patient Active Problem List   Diagnosis Date Noted  . Acute pyelonephritis 09/29/2012  . Nausea 09/29/2012  . Fever 09/29/2012  . HTN (hypertension) 09/29/2012  . H/O: CVA (cerebrovascular accident) 09/29/2012  . Hearing loss 09/29/2012    Sigurd Sos, PT 03/03/2016 2:42 PM  [facility] [address], [place], [place], [address] Phone: [phone]   Fax:  [phone]  Name: Robert [name] MRN: [id number] Date of Birth: 1945-12-18

## 2016-03-09 ENCOUNTER — Ambulatory Visit: Payer: Medicare Other | Admitting: Physical Therapy

## 2016-03-09 ENCOUNTER — Encounter: Payer: Self-pay | Admitting: Physical Therapy

## 2016-03-09 DIAGNOSIS — R2689 Other abnormalities of gait and mobility: Secondary | ICD-10-CM

## 2016-03-09 DIAGNOSIS — M6281 Muscle weakness (generalized): Secondary | ICD-10-CM | POA: Diagnosis not present

## 2016-03-09 DIAGNOSIS — R269 Unspecified abnormalities of gait and mobility: Secondary | ICD-10-CM | POA: Diagnosis not present

## 2016-03-09 DIAGNOSIS — R29898 Other symptoms and signs involving the musculoskeletal system: Secondary | ICD-10-CM | POA: Diagnosis not present

## 2016-03-09 DIAGNOSIS — R531 Weakness: Secondary | ICD-10-CM | POA: Diagnosis not present

## 2016-03-09 NOTE — Therapy (Signed)
[facility] [address], [place], [place], [address] Phone: [phone]   Fax:  [phone]  Physical Therapy Treatment  Patient Details  Name: Robert [name] MRN: [id number] Date of Birth: August 20, 1946 Referring Provider: Dr. [name]  Encounter Date: 03/09/2016      PT End of Session - 03/09/16 1607    Visit Number 11   Number of Visits 20   Authorization Type G-code 10th visit, Kx modifier on 15th visit   PT Start Time 1530   PT Stop Time 1615   PT Time Calculation (min) 45 min   Activity Tolerance Patient tolerated treatment well   Behavior During Therapy [facility] for tasks assessed/performed      Past Medical History  Diagnosis Date  . Hypertension   . GERD (gastroesophageal reflux disease)   . Hyperlipidemia   . Gross hematuria   . Urethral stricture   . S/P insertion of IVC (inferior vena caval) filter     2008  . History of DVT of lower extremity     2008--  cva  . History of acute pyelonephritis     10-13-2012  . Frequency of urination   . Urgency of urination   . Wears glasses   . Wears hearing aid     bilateral-- wears intermittantly  . History of CVA with residual deficit     2008--  left side of body weakness and foot drop (wears leg brace and uses cane)  . Weakness of left side of body     secondary to cva 2008  . Left foot drop     secondary to cva 2008    Past Surgical History  Procedure Laterality Date  . Ivc filter placement ([facility] hx)  2008  . Cystoscopy with urethral dilatation N/A 10/23/2015    Procedure: CYSTOSCOPY WITH URETHRAL BALLOON DILATATION;  Surgeon: [name], MD;  Location: [facility];  Service: Urology;  Laterality: N/A;  BALLOON DILATION   . Cystoscopy with retrograde urethrogram N/A 10/23/2015    Procedure: CYSTOSCOPY WITH RETROGRADE URETHROGRAM;  Surgeon: [name], MD;  [facility];  Service: Urology;  Laterality:  N/A;    There were no vitals filed for this visit.      Subjective Assessment - 03/09/16 1600    Subjective I have been walking outside more and did 3 10 minutes laps. The more I walked the easier it is.    Patient is accompained by: Family member   Patient Stated Goals walk longer at the store and stairs.    Currently in Pain? No/denies            [facility] PT Assessment - 03/09/16 0001    Strength   Left Hip Flexion 3+/5   Left Hip External Rotation 3+/5   Left Hip ABduction 3+/5   Left Knee Flexion 4/5   Left Knee Extension 4-/5                     [facility] Adult PT Treatment/Exercise - 03/09/16 0001    Knee/Hip Exercises: Aerobic   Nustep Level 3 x 10 minutes  used Rt UE, seat 12   Knee/Hip Exercises: Standing   Other Standing Knee Exercises stand and reach for cones at different angles with right hand to challenge balance   Other Standing Knee Exercises stand on right foot and kick ball to side with left foot 15 times   Knee/Hip Exercises: Seated   Long  Arc [employer] Right;20 reps  sat on high low mat with pillows behind back   Long Arc Quad Weight 3 lbs.   Long Arc Quad Limitations left 10x3 2#   Marching Right;20 reps   Marching Limitations therapist gave assistance on left, no weight on left   Marching Weights 3 lbs.  right leg                PT Education - 03/09/16 1607    Education provided No          PT Short Term Goals - 02/19/16 1531    PT SHORT TERM GOAL #1   Title walk for 20 minutes in the park due to increased endurance with 2 rests   Time 4   Period Weeks   Status Achieved   PT SHORT TERM GOAL #2   Title go down steps with minimal assistance to guide his left leg due to increased strength   Time 4   Period Weeks   Status Achieved  Wife guides his LT leg           PT Long Term Goals - 03/09/16 1546    PT LONG TERM GOAL #1   Title go down stairs without crossing left leg in front of right leg   Time 8   Period Weeks    Status Achieved   PT LONG TERM GOAL #2   Title go down stairs with wife supervising him and not helping left leg   Time 8   Period Weeks   Status Achieved   PT LONG TERM GOAL #3   Title walk in park for 45 min with 2 rests using the cane   Time 8   Period Weeks   Status Achieved   PT LONG TERM GOAL #5   Title independent with HEP with some assistance with his wife.    Time 8   Period Weeks   Status On-going   Additional Long Term Goals   Additional Long Term Goals Yes               Plan - 03/09/16 1608    Clinical Impression Statement Patient is walking for 10 min laps for 3 sessions.  Patient is able to go down steps with supervision from his wife.  Patient will benefit from physical therapy to improve strength.    Rehab Potential Good   Clinical Impairments Affecting Rehab Potential CVA in 2008; hemiparesis of left   PT Frequency 2x / week   PT Duration 8 weeks   PT Treatment/Interventions Passive range of motion;Patient/family education;Manual techniques;Functional mobility training;Therapeutic activities;Therapeutic exercise;Stair training;Gait training   PT Next Visit Plan discharge, can use last g-code, review HEP   PT Home Exercise Plan progress as needed   Consulted and Agree with Plan of Care Patient   Family Member Consulted wife      Patient will benefit from skilled therapeutic intervention in order to improve the following deficits and impairments:  Abnormal gait, Difficulty walking, Decreased activity tolerance, Decreased endurance, Decreased strength  Visit Diagnosis: Muscle weakness (generalized)  Other abnormalities of gait and mobility     Problem List Patient Active Problem List   Diagnosis Date Noted  . Acute pyelonephritis 09/29/2012  . Nausea 09/29/2012  . Fever 09/29/2012  . HTN (hypertension) 09/29/2012  . H/O: CVA (cerebrovascular accident) 09/29/2012  . Hearing loss 09/29/2012    Earlie Counts, PT 03/09/2016 4:15 PM   [facility] [address]  [address], [place], [place], [address] Phone: [phone]   Fax:  [phone]  Name: Robert [name] MRN: [id number] Date of Birth: Sep 18, 1946

## 2016-03-11 ENCOUNTER — Ambulatory Visit: Payer: Medicare Other | Admitting: Physical Therapy

## 2016-03-11 ENCOUNTER — Encounter: Payer: Self-pay | Admitting: Physical Therapy

## 2016-03-11 DIAGNOSIS — R269 Unspecified abnormalities of gait and mobility: Secondary | ICD-10-CM | POA: Diagnosis not present

## 2016-03-11 DIAGNOSIS — R531 Weakness: Secondary | ICD-10-CM

## 2016-03-11 DIAGNOSIS — R29898 Other symptoms and signs involving the musculoskeletal system: Secondary | ICD-10-CM | POA: Diagnosis not present

## 2016-03-11 DIAGNOSIS — R2689 Other abnormalities of gait and mobility: Secondary | ICD-10-CM | POA: Diagnosis not present

## 2016-03-11 DIAGNOSIS — M6281 Muscle weakness (generalized): Secondary | ICD-10-CM | POA: Diagnosis not present

## 2016-03-11 NOTE — Therapy (Addendum)
[facility] [address] [place], [place], [address] Phone: [phone]   Fax:  [phone]  Physical Therapy Treatment  Patient Details  Name: Robert [name] MRN: [id number] Date of Birth: Jul 22, 1946 Referring Provider: Dr. [name]  Encounter Date: 03/11/2016      PT End of Session - 03/11/16 1543    Visit Number 12   Number of Visits 20   Date for PT Re-Evaluation 03/11/16   Authorization Type G-code 10th visit, Kx modifier on 15th visit   PT Start Time 1535   PT Stop Time 1610   PT Time Calculation (min) 35 min   Activity Tolerance Patient tolerated treatment well   Behavior During Therapy [facility] for tasks assessed/performed      Past Medical History  Diagnosis Date  . Hypertension   . GERD (gastroesophageal reflux disease)   . Hyperlipidemia   . Gross hematuria   . Urethral stricture   . S/P insertion of IVC (inferior vena caval) filter     2008  . History of DVT of lower extremity     2008--  cva  . History of acute pyelonephritis     10-13-2012  . Frequency of urination   . Urgency of urination   . Wears glasses   . Wears hearing aid     bilateral-- wears intermittantly  . History of CVA with residual deficit     2008--  left side of body weakness and foot drop (wears leg brace and uses cane)  . Weakness of left side of body     secondary to cva 2008  . Left foot drop     secondary to cva 2008    Past Surgical History  Procedure Laterality Date  . Ivc filter placement ([facility] hx)  2008  . Cystoscopy with urethral dilatation N/A 10/23/2015    Procedure: CYSTOSCOPY WITH URETHRAL BALLOON DILATATION;  Surgeon: [name], MD;  Location: [facility];  Service: Urology;  Laterality: N/A;  BALLOON DILATION   . Cystoscopy with retrograde urethrogram N/A 10/23/2015    Procedure: CYSTOSCOPY WITH RETROGRADE URETHROGRAM;  Surgeon: [name], MD;  Location: [facility];  Service: Urology;  Laterality: N/A;    There were no vitals filed for this visit.      Subjective Assessment - 03/11/16 1542    Subjective Doing good.   Currently in Pain? No/denies            [facility] PT Assessment - 03/11/16 0001    Strength   Left Hip Flexion 3+/5   Left Hip External Rotation 3+/5   Left Hip ABduction 3+/5   Left Knee Flexion 4/5   Left Knee Extension 4-/5                     [facility] Adult PT Treatment/Exercise - 03/11/16 0001    Knee/Hip Exercises: Aerobic   Nustep Level 3 x 10 minutes  used Rt UE, seat 12   Knee/Hip Exercises: Standing   Other Standing Knee Exercises Basketball challenge. Pt made 22 shots   Knee/Hip Exercises: Seated   Long Arc Quad Right;20 reps  sat on high low mat with pillows behind back   Marching Right;20 reps                  PT Short Term Goals - 03/11/16 1543    PT SHORT TERM GOAL #1   Title walk for 20 minutes in the park  due to increased endurance with 2 rests   Time 4   Period Weeks   Status Achieved   PT SHORT TERM GOAL #2   Title go down steps with minimal assistance to guide his left leg due to increased strength   Time 4   Period Weeks   Status Achieved           PT Long Term Goals - 26-Mar-2016 1544    PT LONG TERM GOAL #1   Title go down stairs without crossing left leg in front of right leg   Time 8   Period Weeks   Status Achieved   PT LONG TERM GOAL #2   Title go down stairs with wife supervising him and not helping left leg   Time 8   Period Weeks   Status Achieved   PT LONG TERM GOAL #3   Title walk in park for 45 min with 2 rests using the cane   Time 8   Period Weeks   Status Achieved   PT LONG TERM GOAL #4   Title walk in the store to [employer] for 30 minutes due to increased endurance   Time 8   Period Weeks   Status Achieved   PT LONG TERM GOAL #5   Title independent with HEP with some assistance with his wife.    Time 8   Period Weeks   Status  Achieved               Plan - 03-26-2016 1543    Clinical Impression Statement All goals met.    Rehab Potential Good   Clinical Impairments Affecting Rehab Potential CVA in 2008; hemiparesis of left   PT Frequency 2x / week   PT Duration 8 weeks   PT Treatment/Interventions Passive range of motion;Patient/family education;Manual techniques;Functional mobility training;Therapeutic activities;Therapeutic exercise;Stair training;Gait training   PT Next Visit Plan Discharge   Consulted and Agree with Plan of Care Patient      Patient will benefit from skilled therapeutic intervention in order to improve the following deficits and impairments:  Abnormal gait, Difficulty walking, Decreased activity tolerance, Decreased endurance, Decreased strength  Visit Diagnosis: Muscle weakness (generalized)  Other abnormalities of gait and mobility  Weakness  Abnormality of gait  Weakness of left lower extremity       G-Codes - 26-Mar-2016 1701    Functional Assessment Tool Used FOTO score is 72% & clinical judgement   Functional Limitation Mobility: Walking and moving around   Mobility: Walking and Moving Around Goal Status [phone]) At least 40 percent but less than 60 percent impaired, limited or restricted   Mobility: Walking and Moving Around Discharge Status [phone]) At least 40 percent but less than 60 percent impaired, limited or restricted      Problem List Patient Active Problem List   Diagnosis Date Noted  . Acute pyelonephritis 09/29/2012  . Nausea 09/29/2012  . Fever 09/29/2012  . HTN (hypertension) 09/29/2012  . H/O: CVA (cerebrovascular accident) 09/29/2012  . Hearing loss 09/29/2012    Myrene Galas, PTA Mar 26, 2016 5:03 PM  [facility] [address] [place], [place], [address] Phone: [phone]   Fax:  [phone]  Name: Robert [name] MRN: [id number] Date of Birth: 1946/07/20    PHYSICAL  THERAPY DISCHARGE SUMMARY  Visits from Start of Care: 12  Current functional level related to goals / functional outcomes: See above.    Remaining deficits: See above.    Education /  Equipment: HEP  Plan: Patient agrees to discharge.  Patient goals were met. Patient is being discharged due to meeting the stated rehab goals. Thank you for the referral. Earlie Counts, PT 03/11/2016 5:03 PM   ?????

## 2016-03-16 DIAGNOSIS — N3020 Other chronic cystitis without hematuria: Secondary | ICD-10-CM | POA: Diagnosis not present

## 2016-03-16 DIAGNOSIS — Z0000 Encounter for general adult medical examination without abnormal findings: Secondary | ICD-10-CM | POA: Diagnosis not present

## 2016-03-16 DIAGNOSIS — N401 Enlarged prostate with lower urinary tract symptoms: Secondary | ICD-10-CM | POA: Diagnosis not present

## 2016-03-16 DIAGNOSIS — N135 Crossing vessel and stricture of ureter without hydronephrosis: Secondary | ICD-10-CM | POA: Diagnosis not present

## 2016-03-16 DIAGNOSIS — N138 Other obstructive and reflux uropathy: Secondary | ICD-10-CM | POA: Diagnosis not present

## 2016-03-18 ENCOUNTER — Encounter: Payer: Medicare Other | Admitting: Physical Therapy

## 2016-06-01 DIAGNOSIS — N3020 Other chronic cystitis without hematuria: Secondary | ICD-10-CM | POA: Diagnosis not present

## 2016-06-01 DIAGNOSIS — N35011 Post-traumatic bulbous urethral stricture: Secondary | ICD-10-CM | POA: Diagnosis not present

## 2016-08-04 DIAGNOSIS — E785 Hyperlipidemia, unspecified: Secondary | ICD-10-CM | POA: Diagnosis not present

## 2016-08-04 DIAGNOSIS — Z125 Encounter for screening for malignant neoplasm of prostate: Secondary | ICD-10-CM | POA: Diagnosis not present

## 2016-08-04 DIAGNOSIS — R7309 Other abnormal glucose: Secondary | ICD-10-CM | POA: Diagnosis not present

## 2016-08-04 DIAGNOSIS — I10 Essential (primary) hypertension: Secondary | ICD-10-CM | POA: Diagnosis not present

## 2016-08-04 DIAGNOSIS — Z0000 Encounter for general adult medical examination without abnormal findings: Secondary | ICD-10-CM | POA: Diagnosis not present

## 2016-10-23 DIAGNOSIS — L299 Pruritus, unspecified: Secondary | ICD-10-CM | POA: Diagnosis not present

## 2016-10-23 DIAGNOSIS — R251 Tremor, unspecified: Secondary | ICD-10-CM | POA: Diagnosis not present

## 2016-12-16 ENCOUNTER — Ambulatory Visit: Payer: Medicare Other | Attending: Family Medicine

## 2016-12-16 DIAGNOSIS — M6281 Muscle weakness (generalized): Secondary | ICD-10-CM | POA: Insufficient documentation

## 2016-12-16 DIAGNOSIS — R2689 Other abnormalities of gait and mobility: Secondary | ICD-10-CM | POA: Diagnosis not present

## 2016-12-16 NOTE — Therapy (Signed)
[facility] [address] [place], [place], [address] Phone: [phone]   Fax:  [phone]  Physical Therapy Evaluation  Patient Details  Name: Robert [name] MRN: [id number] Date of Birth: 1946/07/15 Referring Provider: [name], MD  Encounter Date: 12/16/2016      PT End of Session - 12/16/16 1522    Visit Number 1   Number of Visits 10   Date for PT Re-Evaluation 02/10/17   PT Start Time 7681   PT Stop Time 1525   PT Time Calculation (min) 38 min   Activity Tolerance Patient tolerated treatment well   Behavior During Therapy [facility] for tasks assessed/performed      Past Medical History:  Diagnosis Date  . Frequency of urination   . GERD (gastroesophageal reflux disease)   . Gross hematuria   . History of acute pyelonephritis    10-13-2012  . History of CVA with residual deficit    2008--  left side of body weakness and foot drop (wears leg brace and uses cane)  . History of DVT of lower extremity    2008--  cva  . Hyperlipidemia   . Hypertension   . 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 Urethral stricture   . Urgency of urination   . Weakness of left side of body    secondary to cva 2008  . Wears glasses   . Wears hearing aid    bilateral-- wears intermittantly    Past Surgical History:  Procedure Laterality Date  . CYSTOSCOPY WITH RETROGRADE URETHROGRAM N/A 10/23/2015   Procedure: CYSTOSCOPY WITH RETROGRADE URETHROGRAM;  Surgeon: [name], MD;  Location: [facility];  Service: Urology;  Laterality: N/A;  . CYSTOSCOPY WITH URETHRAL DILATATION N/A 10/23/2015   Procedure: CYSTOSCOPY WITH URETHRAL BALLOON DILATATION;  Surgeon: [name], MD;  Location: [facility];  Service: Urology;  Laterality: N/A;  BALLOON DILATION   . IVC FILTER PLACEMENT ([place] HX)  2008    There were no vitals filed  for this visit.       Subjective Assessment - 12/16/16 1450    Subjective Pt reports to PT with Lt weakness/hemiparesis from CVA that he had in 2008.  Pt had a bout of PT at this clinic ending 02/2016.  Pt repsponded well to this therapy and made improvements in his strength.  Pt's wife reports that he has gotten weak in the Winter months as he is not able to get out as much.     Pertinent History CVA 2008   How long can you stand comfortably? 10 minutes   How long can you walk comfortably? < 5 minutes   Patient Stated Goals get stronger, walk with wife in park, descend steps without assistance for Lt LE   Currently in Pain? Yes   Pain Score 5    Pain Location Knee   Pain Orientation Right   Pain Descriptors / Indicators Aching   Pain Type Acute pain   Pain Onset More than a month ago   Pain Frequency Intermittent   Aggravating Factors  standing, walking   Pain Relieving Factors topical analgesic, rest            [facility] PT Assessment - 12/16/16 0001      Assessment   Medical Diagnosis hemiparesis   Referring Provider [name], MD   Onset Date/Surgical  Date 07/27/07   Hand Dominance Right   Prior Therapy at this clinic until 02/2016     Precautions   Precautions Fall     Restrictions   Weight Bearing Restrictions No     Balance Screen   Has the patient fallen in the past 6 months No   Has the patient had a decrease in activity level because of a fear of falling?  No   Is the patient reluctant to leave their home because of a fear of falling?  No     Home Social worker Private residence   Living Arrangements Spouse/significant other   Type of [place] to enter   Entrance Stairs-Number of Steps 12   [place] One level   [place] - single point;Tub bench;Toilet riser;Grab bars - toilet;Grab bars - tub/shower     Prior Function   Level of Independence Needs assistance  with ADLs;Independent with household mobility with device;Independent with basic ADLs;Needs assistance with transfers   Vocation Retired     [employer]   Overall Cognitive Status Within Functional Limits for tasks assessed     Observation/Other Assessments   Focus on Therapeutic Outcomes (FOTO)  58% limitation     Posture/Postural Control   Posture/Postural Control Postural limitations   Postural Limitations Flexed trunk;Weight shift right     ROM / Strength   AROM / PROM / Strength AROM;Strength     AROM   Overall AROM  Deficits   Overall AROM Comments Lt LE A/ROM deficits due to CVA, Lt UE flacid     Strength   Overall Strength Deficits   Overall Strength Comments Lt UE < 3/5    Strength Assessment Site Knee;Hip;Ankle   Right/Left Hip Right;Left   Right Hip Flexion 4-/5   Left Hip Flexion 2+/5   Right/Left Knee Right;Left   Right Knee Flexion 4/5   Right Knee Extension 4/5   Left Knee Flexion 3-/5   Left Knee Extension 2+/5   Right/Left Ankle Right;Left   Right Ankle Dorsiflexion 4/5   Left Ankle Dorsiflexion 1/5     Palpation   Palpation comment NA     Transfers   Transfers Sit to Stand;Stand to Sit   Sit to Stand From elevated surface;With upper extremity assist;4: Min assist   Stand to Sit 5: Supervision;With upper extremity assist;With armrests;To elevated surface     Ambulation/Gait   Ambulation/Gait Yes   Ambulation Distance (Feet) 100 Feet   Assistive device Straight cane   Gait Pattern Step-to pattern;Decreased arm swing - left;Decreased step length - right;Decreased step length - left;Decreased stance time - left;Decreased dorsiflexion - left;Left steppage;Ataxic     Balance   Balance Assessed Yes     Standardized Balance Assessment   Standardized Balance Assessment Timed Up and Go Test     Timed Up and Go Test   TUG Normal TUG   Normal TUG (seconds) 49                   [facility] Adult PT Treatment/Exercise - 12/16/16 0001       Exercises   Exercises Knee/Hip     Knee/Hip Exercises: Aerobic   Nustep Level 1x 8 minutes (Rt UE only)                  PT Short Term Goals - 12/16/16 1524      PT SHORT TERM GOAL #1  Title be independent in initial HEP   Time 4   Period Weeks   Status New     PT SHORT TERM GOAL #2   Title go down steps with moderate to minimal assistance to guide his left leg due to increased strength   Time 4   Period Weeks   Status New     PT SHORT TERM GOAL #3   Title improve strength to perform TUG in < or = to 41 seconds   Time 4   Period Weeks   Status New           PT Long Term Goals - 12/16/16 1445      PT LONG TERM GOAL #1   Title be independent with HEP with assistance from his wife   Time 8   Period Days   Status New     PT LONG TERM GOAL #2   Title reduce FOTO to < or = to 40% limitation   Time 8   Period Weeks   Status New     PT LONG TERM GOAL #3   Title walk in park for 30 min with 2 rests using the cane   Time 8   Period Weeks   Status New     PT LONG TERM GOAL #4   Title descend steps with minimal to no assistance with Lt LE due to increased strength   Time 8   Period Weeks   Status New     PT LONG TERM GOAL #5   Title perform sit to stand with supervision due to increased LE strength   Time 8   Period Days   Status New     Additional Long Term Goals   Additional Long Term Goals Yes     PT LONG TERM GOAL #6   Title perform TUG in < or = to 35 seconds   Time 8   Period Weeks   Status New               Plan - 12/16/16 1527    Clinical Impression Statement Pt presents to PT with chronic Lt UE/LE weakness due to CVA in 2008.  Pt is a moderate complexity evaluation due to evolving condition and comorbidities that will impact care.  Pt was treated at this clinic and met all strength goals.  Pt reports progressive recently due to being inside during the winter months.  Pt demonstrates gait abnormality, Lt>Rt LE weakness, mod  assistance needed with sit to stand and TUG in 49 seconds.  Pt will benefit from skilled PT for UE/LE strength, empahasis on descending steps, sit to stand and endurance gains.     Rehab Potential Good   PT Frequency 2x / week   PT Duration 8 weeks   PT Treatment/Interventions ADLs/Self Care Home Management;Cryotherapy;Electrical Stimulation;Functional mobility training;Stair training;Gait training;Ultrasound;Moist Heat;Therapeutic activities;Therapeutic exercise;Balance training;Neuromuscular re-education;Patient/family education;Passive range of motion;Manual techniques;Taping   PT Next Visit Plan LE strength, sit to stand, gait, endurance/balance   Consulted and Agree with Plan of Care Patient      Patient will benefit from skilled therapeutic intervention in order to improve the following deficits and impairments:  Abnormal gait, Decreased range of motion, Difficulty walking, Pain, Decreased strength, Decreased balance, Impaired flexibility, Decreased activity tolerance, Decreased endurance, Impaired UE functional use  Visit Diagnosis: Muscle weakness (generalized) - Plan: PT plan of care cert/re-cert  Other abnormalities of gait and mobility - Plan: PT plan of care cert/re-cert  G-Codes - 12/16/16 1445    Functional Assessment Tool Used (Outpatient Only) FOTO: 58% limitation   Functional Limitation Mobility: Walking and moving around   Mobility: Walking and Moving Around Current Status [phone]) At least 40 percent but less than 60 percent impaired, limited or restricted   Mobility: Walking and Moving Around Goal Status [phone]) At least 40 percent but less than 60 percent impaired, limited or restricted       Problem List Patient Active Problem List   Diagnosis Date Noted  . Acute pyelonephritis 09/29/2012  . Nausea 09/29/2012  . Fever 09/29/2012  . HTN (hypertension) 09/29/2012  . H/O: CVA (cerebrovascular accident) 09/29/2012  . Hearing loss 09/29/2012     Sigurd Sos, PT 12/16/16 3:46 PM  [facility] [address] [place], [place], [address] Phone: [phone]   Fax:  [phone]  Name: Robert [name] MRN: [id number] Date of Birth: 1946/03/29

## 2016-12-23 ENCOUNTER — Encounter: Payer: Medicare Other | Admitting: Physical Therapy

## 2016-12-28 ENCOUNTER — Encounter: Payer: Self-pay | Admitting: Physical Therapy

## 2016-12-28 ENCOUNTER — Ambulatory Visit: Payer: Medicare Other | Attending: Family Medicine | Admitting: Physical Therapy

## 2016-12-28 DIAGNOSIS — R29898 Other symptoms and signs involving the musculoskeletal system: Secondary | ICD-10-CM | POA: Insufficient documentation

## 2016-12-28 DIAGNOSIS — R269 Unspecified abnormalities of gait and mobility: Secondary | ICD-10-CM | POA: Diagnosis not present

## 2016-12-28 DIAGNOSIS — R2689 Other abnormalities of gait and mobility: Secondary | ICD-10-CM | POA: Diagnosis not present

## 2016-12-28 DIAGNOSIS — R531 Weakness: Secondary | ICD-10-CM | POA: Diagnosis not present

## 2016-12-28 DIAGNOSIS — M6281 Muscle weakness (generalized): Secondary | ICD-10-CM | POA: Diagnosis not present

## 2016-12-28 NOTE — Therapy (Signed)
[facility] [address], [place], [place], [address] Phone: [phone]   Fax:  [phone]  Physical Therapy Treatment  Patient Details  Name: Robert [name] MRN: [id number] Date of Birth: 02-Aug-1946 Referring Provider: [name], MD  Encounter Date: 12/28/2016      PT End of Session - 12/28/16 1453    Visit Number 2   Number of Visits 10   Date for PT Re-Evaluation 02/10/17   PT Start Time [id number]   PT Stop Time 1514   PT Time Calculation (min) 38 min   Activity Tolerance Patient tolerated treatment well   Behavior During Therapy [facility] for tasks assessed/performed      Past Medical History:  Diagnosis Date  . Frequency of urination   . GERD (gastroesophageal reflux disease)   . Gross hematuria   . History of acute pyelonephritis    10-13-2012  . History of CVA with residual deficit    2008--  left side of body weakness and foot drop (wears leg brace and uses cane)  . History of DVT of lower extremity    2008--  cva  . Hyperlipidemia   . Hypertension   . 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 Urethral stricture   . Urgency of urination   . Weakness of left side of body    secondary to cva 2008  . Wears glasses   . Wears hearing aid    bilateral-- wears intermittantly    Past Surgical History:  Procedure Laterality Date  . CYSTOSCOPY WITH RETROGRADE URETHROGRAM N/A 10/23/2015   Procedure: CYSTOSCOPY WITH RETROGRADE URETHROGRAM;  Surgeon: [name], MD;  Location: [facility];  Service: Urology;  Laterality: N/A;  . CYSTOSCOPY WITH URETHRAL DILATATION N/A 10/23/2015   Procedure: CYSTOSCOPY WITH URETHRAL BALLOON DILATATION;  Surgeon: [name], MD;  Location: [facility];  Service: Urology;  Laterality: N/A;  BALLOON DILATION   . IVC FILTER PLACEMENT ([place] HX)  2008    There were no vitals filed  for this visit.      Subjective Assessment - 12/28/16 1439    Subjective Pt's wife states she got a knee brace for pt's Rt knee. Stated that patient likes to wear brace to feel more stable. Pt reports some pain in Rt knee when getting up or putting pressure on leg.   Pertinent History CVA 2008   How long can you stand comfortably? 10 minutes   How long can you walk comfortably? < 5 minutes   Patient Stated Goals get stronger, walk with wife in park, descend steps without assistance for Lt LE   Currently in Pain? Yes   Pain Score 2    Pain Location Knee   Pain Orientation Right   Pain Descriptors / Indicators Aching   Pain Type Acute pain   Pain Onset More than a month ago   Pain Frequency Intermittent   Aggravating Factors  Standing, walking   Pain Relieving Factors topical analegic, rest                         [facility] Adult PT Treatment/Exercise - 12/28/16 0001      Knee/Hip Exercises: Aerobic   Nustep Level 1x 8 minutes (Rt UE only)  Therapist present to discuss treatment     Knee/Hip Exercises: [place]  Quad Strengthening;Right;2 sets;10 reps   Long [employer] 2 lbs.   Ball Squeeze x20   Marching Limitations 2x10   Marching Weights 2 lbs.   Abduction/Adduction  Strengthening;Both;20 reps   Sit to Sand 5 reps;with UE support                PT Education - 12/28/16 1506    Education provided Yes   Education Details LE strength   Person(s) Educated Patient   Methods Explanation;Demonstration;Handout   Comprehension Verbalized understanding          PT Short Term Goals - 12/28/16 1454      PT SHORT TERM GOAL #1   Title be independent in initial HEP   Time 4   Period Weeks   Status On-going     PT SHORT TERM GOAL #2   Title go down steps with moderate to minimal assistance to guide his left leg due to increased strength   Time 4   Period Weeks   Status On-going     PT SHORT TERM GOAL #3   Title improve strength to  perform TUG in < or = to 41 seconds   Time 4   Period Weeks   Status On-going           PT Long Term Goals - 12/28/16 1455      PT LONG TERM GOAL #1   Title be independent with HEP with assistance from his wife   Time 8   Period Days   Status On-going     PT LONG TERM GOAL #2   Title reduce FOTO to < or = to 40% limitation   Time 8   Period Weeks   Status On-going     PT LONG TERM GOAL #3   Title walk in park for 30 min with 2 rests using the cane   Time 8   Period Weeks   Status On-going     PT LONG TERM GOAL #4   Title descend steps with minimal to no assistance with Lt LE due to increased strength   Time 8   Period Weeks   Status On-going     PT LONG TERM GOAL #5   Title perform sit to stand with supervision due to increased LE strength   Time 8   Period Days   Status On-going     PT LONG TERM GOAL #6   Title perform TUG in < or = to 35 seconds   Time 8   Period Weeks   Status On-going               Plan - 12/28/16 1508    Clinical Impression Statement Pt presents with Lt UE/LE weakness and antalgic gait. AAROM on Lt. Pt able to tolerate all exercises well needing Mod assist for transfers. Pt will continue to benefit from skilled therapy for LE strengthening and Rt LE stability.    Rehab Potential Good   PT Frequency 2x / week   PT Duration 8 weeks   PT Treatment/Interventions ADLs/Self Care Home Management;Cryotherapy;Electrical Stimulation;Functional mobility training;Stair training;Gait training;Ultrasound;Moist Heat;Therapeutic activities;Therapeutic exercise;Balance training;Neuromuscular re-education;Patient/family education;Passive range of motion;Manual techniques;Taping   PT Next Visit Plan Endurance, LE strength, balance   Consulted and Agree with Plan of Care Patient      Patient will benefit from skilled therapeutic intervention in order to improve the following deficits and impairments:  Abnormal gait, Decreased range of motion,  Difficulty walking, Pain, Decreased strength, Decreased balance,  Impaired flexibility, Decreased activity tolerance, Decreased endurance, Impaired UE functional use  Visit Diagnosis: Muscle weakness (generalized)  Other abnormalities of gait and mobility  Weakness  Abnormality of gait  Weakness of left lower extremity     Problem List Patient Active Problem List   Diagnosis Date Noted  . Acute pyelonephritis 09/29/2012  . Nausea 09/29/2012  . Fever 09/29/2012  . HTN (hypertension) 09/29/2012  . H/O: CVA (cerebrovascular accident) 09/29/2012  . Hearing loss 09/29/2012    Mikle Bosworth PTA 12/28/2016, 3:46 PM  [facility] [address] [place], [place], [address] Phone: [phone]   Fax:  [phone]  Name: Robert [name] MRN: [id number] Date of Birth: Feb 25, 1946

## 2016-12-28 NOTE — Patient Instructions (Signed)
(  Home) Knee: Extension - Sitting    Anchor behind, sit with knee over edge of chair. Lift leg, straighten knee. Repeat _10___ times per set. Do __2__ sets per session. Do __3__ sessions per week.  Copyright  VHI. All rights reserved.  ABDUCTION: Sitting - Resistance Band (Active)    Sit with feet flat. Lift right leg slightly and, against yellow resistance band, draw it out to side. Complete __2_ sets of __10_ repetitions..  Copyright  VHI. All rights reserved.   Knee Raise    Lift knee and then lower it. Repeat with other knee. Repeat __10__ times. Do __2__ sets per session.  http://gt2.exer.us/445   Copyright  VHI. All rights reserved.   Adduction: Hip - Knees Together (Sitting)    Sit with towel roll between knees. Push knees together. Hold for _3__ seconds. Rest for __3_ seconds. Repeat _10__ times. Do _2__ sets per session.  Copyright  VHI. All rights reserved.   Mikle Bosworth, PTA 12/28/16 3:04 PM  [facility] [address] [place], [place] [address] Phone # [phone] Fax [phone]

## 2016-12-30 ENCOUNTER — Encounter: Payer: Medicare Other | Admitting: Physical Therapy

## 2017-01-04 ENCOUNTER — Ambulatory Visit: Payer: Medicare Other | Admitting: Physical Therapy

## 2017-01-05 ENCOUNTER — Encounter: Payer: Medicare Other | Admitting: Physical Therapy

## 2017-01-11 ENCOUNTER — Ambulatory Visit: Payer: Medicare Other | Admitting: Physical Therapy

## 2017-01-11 ENCOUNTER — Encounter: Payer: Self-pay | Admitting: Physical Therapy

## 2017-01-11 DIAGNOSIS — R2689 Other abnormalities of gait and mobility: Secondary | ICD-10-CM | POA: Diagnosis not present

## 2017-01-11 DIAGNOSIS — R29898 Other symptoms and signs involving the musculoskeletal system: Secondary | ICD-10-CM | POA: Diagnosis not present

## 2017-01-11 DIAGNOSIS — R531 Weakness: Secondary | ICD-10-CM | POA: Diagnosis not present

## 2017-01-11 DIAGNOSIS — R269 Unspecified abnormalities of gait and mobility: Secondary | ICD-10-CM | POA: Diagnosis not present

## 2017-01-11 DIAGNOSIS — M6281 Muscle weakness (generalized): Secondary | ICD-10-CM | POA: Diagnosis not present

## 2017-01-11 NOTE — Therapy (Signed)
[facility] [address], [place], [place], [address] Phone: [phone]   Fax:  [phone]  Physical Therapy Treatment  Patient Details  Name: Robert [name] MRN: [id number] Date of Birth: 1945-12-04 Referring Provider: [name], MD  Encounter Date: 01/11/2017      PT End of Session - 01/11/17 1458    Visit Number 3   Number of Visits 10   Date for PT Re-Evaluation 02/10/17   PT Start Time 5093   PT Stop Time 1527   PT Time Calculation (min) 42 min   Activity Tolerance Patient tolerated treatment well   Behavior During Therapy [facility] for tasks assessed/performed      Past Medical History:  Diagnosis Date  . Frequency of urination   . GERD (gastroesophageal reflux disease)   . Gross hematuria   . History of acute pyelonephritis    10-13-2012  . History of CVA with residual deficit    2008--  left side of body weakness and foot drop (wears leg brace and uses cane)  . History of DVT of lower extremity    2008--  cva  . Hyperlipidemia   . Hypertension   . 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 Urethral stricture   . Urgency of urination   . Weakness of left side of body    secondary to cva 2008  . Wears glasses   . Wears hearing aid    bilateral-- wears intermittantly    Past Surgical History:  Procedure Laterality Date  . CYSTOSCOPY WITH RETROGRADE URETHROGRAM N/A 10/23/2015   Procedure: CYSTOSCOPY WITH RETROGRADE URETHROGRAM;  Surgeon: [name], MD;  Location: [facility];  Service: Urology;  Laterality: N/A;  . CYSTOSCOPY WITH URETHRAL DILATATION N/A 10/23/2015   Procedure: CYSTOSCOPY WITH URETHRAL BALLOON DILATATION;  Surgeon: [name], MD;  Location: [facility];  Service: Urology;  Laterality: N/A;  BALLOON DILATION   . IVC FILTER PLACEMENT ([place] HX)  2008    There were no vitals filed  for this visit.      Subjective Assessment - 01/11/17 1450    Subjective Pt reports knee feeling ok today. Weaing brace on Rt knee. Pain only when getting up   Pertinent History CVA 2008   How long can you stand comfortably? 10 minutes   How long can you walk comfortably? < 5 minutes   Patient Stated Goals get stronger, walk with wife in park, descend steps without assistance for Lt LE   Currently in Pain? Yes   Pain Score 1    Pain Location Knee   Pain Orientation Right   Pain Descriptors / Indicators Aching   Pain Type Acute pain   Pain Onset More than a month ago   Pain Frequency Intermittent   Aggravating Factors  standing, walking   Pain Relieving Factors topical analegic, rest                         [facility] Adult PT Treatment/Exercise - 01/11/17 0001      Knee/Hip Exercises: Aerobic   Nustep Level 1x 10 minutes (Rt UE only)  Therapist present to discuss treatment     Knee/Hip Exercises: Standing   Other Standing Knee Exercises Reaching for cones for balance     Knee/Hip Exercises: Seated   Long Arc Quad Strengthening;Right;2 sets;10 reps  Long Arc Quad Weight 3 lbs.   Ball Squeeze x20   Marching Limitations 2x10   Marching Weights 3 lbs.   Abduction/Adduction  Strengthening;Both;20 reps   Sit to Sand 2 sets;5 reps;with UE support     Knee/Hip Exercises: Supine   Short Arc [employer] --                  PT Short Term Goals - 01/11/17 1456      PT SHORT TERM GOAL #1   Title be independent in initial HEP   Time 4   Period Weeks   Status Achieved     PT SHORT TERM GOAL #2   Title go down steps with moderate to minimal assistance to guide his left leg due to increased strength   Time 4   Period Weeks   Status On-going           PT Long Term Goals - 01/11/17 1507      PT LONG TERM GOAL #1   Title be independent with HEP with assistance from his wife   Time 8   Period Days   Status On-going     PT LONG TERM GOAL #3    Title walk in park for 30 min with 2 rests using the cane   Time 8   Period Weeks   Status On-going     PT LONG TERM GOAL #4   Title descend steps with minimal to no assistance with Lt LE due to increased strength   Time 8   Period Weeks   Status On-going     PT LONG TERM GOAL #6   Title perform TUG in < or = to 35 seconds   Time 8   Period Weeks   Status On-going               Plan - 01/11/17 1528    Clinical Impression Statement Pt able to tolerate all strengthening exercises well. Able to complete 10 minutes on Nustep but fatigued afterwards. Pt practiced reaching with Rt UE for cones to increase balance with weight shifting. Pt will continue to benefit from skilled thearpy for strengthening and endurance.    Rehab Potential Good   PT Frequency 2x / week   PT Duration 8 weeks   PT Treatment/Interventions ADLs/Self Care Home Management;Cryotherapy;Electrical Stimulation;Functional mobility training;Stair training;Gait training;Ultrasound;Moist Heat;Therapeutic activities;Therapeutic exercise;Balance training;Neuromuscular re-education;Patient/family education;Passive range of motion;Manual techniques;Taping   PT Next Visit Plan Endurance, LE strength, balance, Begin stairs   Consulted and Agree with Plan of Care Patient      Patient will benefit from skilled therapeutic intervention in order to improve the following deficits and impairments:  Abnormal gait, Decreased range of motion, Difficulty walking, Pain, Decreased strength, Decreased balance, Impaired flexibility, Decreased activity tolerance, Decreased endurance, Impaired UE functional use  Visit Diagnosis: Muscle weakness (generalized)  Other abnormalities of gait and mobility  Weakness  Abnormality of gait  Weakness of left lower extremity     Problem List Patient Active Problem List   Diagnosis Date Noted  . Acute pyelonephritis 09/29/2012  . Nausea 09/29/2012  . Fever 09/29/2012  . HTN  (hypertension) 09/29/2012  . H/O: CVA (cerebrovascular accident) 09/29/2012  . Hearing loss 09/29/2012    Mikle Bosworth PTA 01/11/2017, 5:01 PM  [facility] [address] [place], [place], [address] Phone: [phone]   Fax:  [phone]  Name: Robert [name] MRN: [id number] Date of Birth: 1946/07/21

## 2017-01-12 ENCOUNTER — Ambulatory Visit: Payer: Medicare Other | Admitting: Neurology

## 2017-01-13 ENCOUNTER — Encounter: Payer: Medicare Other | Admitting: Physical Therapy

## 2017-01-18 ENCOUNTER — Encounter: Payer: Self-pay | Admitting: Physical Therapy

## 2017-01-18 ENCOUNTER — Ambulatory Visit: Payer: Medicare Other | Admitting: Physical Therapy

## 2017-01-18 DIAGNOSIS — R29898 Other symptoms and signs involving the musculoskeletal system: Secondary | ICD-10-CM | POA: Diagnosis not present

## 2017-01-18 DIAGNOSIS — R269 Unspecified abnormalities of gait and mobility: Secondary | ICD-10-CM

## 2017-01-18 DIAGNOSIS — M6281 Muscle weakness (generalized): Secondary | ICD-10-CM | POA: Diagnosis not present

## 2017-01-18 DIAGNOSIS — R531 Weakness: Secondary | ICD-10-CM | POA: Diagnosis not present

## 2017-01-18 DIAGNOSIS — R2689 Other abnormalities of gait and mobility: Secondary | ICD-10-CM

## 2017-01-18 NOTE — Therapy (Signed)
[facility] [address], [place], [place], [address] Phone: [phone]   Fax:  [phone]  Physical Therapy Treatment  Patient Details  Name: Robert [name] MRN: [id number] Date of Birth: [birth year] Referring Provider: [name], MD  Encounter Date: 01/18/2017      PT End of Session - 01/18/17 1455    Visit Number 4   Number of Visits 10   Date for PT Re-Evaluation 02/10/17   PT Start Time 6962   PT Stop Time 1530   PT Time Calculation (min) 44 min   Activity Tolerance Patient tolerated treatment well   Behavior During Therapy [facility] for tasks assessed/performed      Past Medical History:  Diagnosis Date  . Frequency of urination   . GERD (gastroesophageal reflux disease)   . Gross hematuria   . History of acute pyelonephritis    10-13-2012  . History of CVA with residual deficit    2008--  left side of body weakness and foot drop (wears leg brace and uses cane)  . History of DVT of lower extremity    2008--  cva  . Hyperlipidemia   . Hypertension   . 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 Urethral stricture   . Urgency of urination   . Weakness of left side of body    secondary to cva 2008  . Wears glasses   . Wears hearing aid    bilateral-- wears intermittantly    Past Surgical History:  Procedure Laterality Date  . CYSTOSCOPY WITH RETROGRADE URETHROGRAM N/A 10/23/2015   Procedure: CYSTOSCOPY WITH RETROGRADE URETHROGRAM;  Surgeon: [name], MD;  Location: [facility];  Service: Urology;  Laterality: N/A;  . CYSTOSCOPY WITH URETHRAL DILATATION N/A 10/23/2015   Procedure: CYSTOSCOPY WITH URETHRAL BALLOON DILATATION;  Surgeon: [name], MD;  Location: [facility];  Service: Urology;  Laterality: N/A;  BALLOON DILATION   . IVC FILTER PLACEMENT ([place] HX)  2008    There were no vitals filed  for this visit.      Subjective Assessment - 01/18/17 1453    Subjective Pt reports feeling tiered today and weak. Therapist checked BP 105/70. Pt denied dizzyness and instructed to go slow and report if feeling worse. Knee feeling better today, denies any pain.    Pertinent History CVA 2008   How long can you stand comfortably? 10 minutes   How long can you walk comfortably? < 5 minutes   Patient Stated Goals get stronger, walk with wife in park, descend steps without assistance for Lt LE   Currently in Pain? No/denies   Pain Score 0-No pain                         [facility] Adult PT Treatment/Exercise - 01/18/17 0001      Knee/Hip Exercises: Aerobic   Nustep Level 1x 10 minutes (Rt UE only)  Therapist present to discuss treatment     Knee/Hip Exercises: Standing   Other Standing Knee Exercises Reaching for cones for balance     Knee/Hip Exercises: Seated   Long Arc Quad Strengthening;Right;2 sets;10 reps   Long Arc Quad Weight 3 lbs.   Ball Squeeze x20   Marching Limitations 2x10   Marching Weights 3 lbs.  PT Short Term Goals - 01/18/17 1455      PT SHORT TERM GOAL #2   Title go down steps with moderate to minimal assistance to guide his left leg due to increased strength   Time 4   Period Weeks   Status On-going     PT SHORT TERM GOAL #3   Title improve strength to perform TUG in < or = to 41 seconds   Time 4   Period Weeks   Status On-going           PT Long Term Goals - 01/18/17 1456      PT LONG TERM GOAL #1   Title be independent with HEP with assistance from his wife   Time 8   Period Days   Status On-going               Plan - 01/18/17 1710    Clinical Impression Statement Pt feeling weak today, possibly due to not having eaten lunch. Able to tolerate all exercsies well. Did well with standing stacking cones into shelf with CGA. Pt will continue to benefit from skilled therapy for strengthening,  endurance, and balance.    Rehab Potential Good   PT Frequency 2x / week   PT Duration 8 weeks   PT Treatment/Interventions ADLs/Self Care Home Management;Cryotherapy;Electrical Stimulation;Functional mobility training;Stair training;Gait training;Ultrasound;Moist Heat;Therapeutic activities;Therapeutic exercise;Balance training;Neuromuscular re-education;Patient/family education;Passive range of motion;Manual techniques;Taping   PT Next Visit Plan Endurance, LE strength, balance, Begin stairs   Consulted and Agree with Plan of Care Patient      Patient will benefit from skilled therapeutic intervention in order to improve the following deficits and impairments:  Abnormal gait, Decreased range of motion, Difficulty walking, Pain, Decreased strength, Decreased balance, Impaired flexibility, Decreased activity tolerance, Decreased endurance, Impaired UE functional use  Visit Diagnosis: Muscle weakness (generalized)  Other abnormalities of gait and mobility  Weakness  Abnormality of gait  Weakness of left lower extremity     Problem List Patient Active Problem List   Diagnosis Date Noted  . Acute pyelonephritis 09/29/2012  . Nausea 09/29/2012  . Fever 09/29/2012  . HTN (hypertension) 09/29/2012  . H/O: CVA (cerebrovascular accident) 09/29/2012  . Hearing loss 09/29/2012    Robert [name] PTA 01/18/2017, 5:17 PM  [facility] [address] [place], [place], [address] Phone: [phone]   Fax:  [phone]  Name: Robert [name] MRN: [id number] Date of Birth: [birth year]

## 2017-01-20 ENCOUNTER — Ambulatory Visit: Payer: Medicare Other

## 2017-01-20 DIAGNOSIS — R2689 Other abnormalities of gait and mobility: Secondary | ICD-10-CM | POA: Diagnosis not present

## 2017-01-20 DIAGNOSIS — R29898 Other symptoms and signs involving the musculoskeletal system: Secondary | ICD-10-CM | POA: Diagnosis not present

## 2017-01-20 DIAGNOSIS — R269 Unspecified abnormalities of gait and mobility: Secondary | ICD-10-CM | POA: Diagnosis not present

## 2017-01-20 DIAGNOSIS — M6281 Muscle weakness (generalized): Secondary | ICD-10-CM | POA: Diagnosis not present

## 2017-01-20 DIAGNOSIS — R531 Weakness: Secondary | ICD-10-CM | POA: Diagnosis not present

## 2017-01-20 NOTE — Therapy (Signed)
[facility] [address] [place], [place], [address] Phone: [phone]   Fax:  [phone]  Physical Therapy Treatment  Patient Details  Name: Robert [name] MRN: [id number] Date of Birth: 05/02/1946 Referring Provider: [name], MD  Encounter Date: 01/20/2017      PT End of Session - 01/20/17 1514    Visit Number 5   Number of Visits 10   Date for PT Re-Evaluation 02/10/17   PT Start Time 6962   PT Stop Time 1517   PT Time Calculation (min) 42 min   Activity Tolerance Patient tolerated treatment well   Behavior During Therapy [facility] for tasks assessed/performed      Past Medical History:  Diagnosis Date  . Frequency of urination   . GERD (gastroesophageal reflux disease)   . Gross hematuria   . History of acute pyelonephritis    10-13-2012  . History of CVA with residual deficit    2008--  left side of body weakness and foot drop (wears leg brace and uses cane)  . History of DVT of lower extremity    2008--  cva  . Hyperlipidemia   . Hypertension   . 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 Urethral stricture   . Urgency of urination   . Weakness of left side of body    secondary to cva 2008  . Wears glasses   . Wears hearing aid    bilateral-- wears intermittantly    Past Surgical History:  Procedure Laterality Date  . CYSTOSCOPY WITH RETROGRADE URETHROGRAM N/A 10/23/2015   Procedure: CYSTOSCOPY WITH RETROGRADE URETHROGRAM;  Surgeon: [name], MD;  Location: [facility];  Service: Urology;  Laterality: N/A;  . CYSTOSCOPY WITH URETHRAL DILATATION N/A 10/23/2015   Procedure: CYSTOSCOPY WITH URETHRAL BALLOON DILATATION;  Surgeon: [name], MD;  Location: [facility];  Service: Urology;  Laterality: N/A;  BALLOON DILATION   . IVC FILTER PLACEMENT ([place] HX)  2008    There were no vitals filed  for this visit.      Subjective Assessment - 01/20/17 1439    Subjective Feeling better today.     Currently in Pain? No/denies            [facility] PT Assessment - 01/20/17 0001      Timed Up and Go Test   TUG Normal TUG   Normal TUG (seconds) 44                     [facility] Adult PT Treatment/Exercise - 01/20/17 0001      Knee/Hip Exercises: Aerobic   Nustep Level 3x 101minutes (Legs and Rt UE only)  Therapist present to discuss treatment     Knee/Hip Exercises: Standing   Other Standing Knee Exercises --     Knee/Hip Exercises: Seated   Long Arc Quad Strengthening;Right;2 sets;10 reps  heel slides using slider under Lt foot x 10   Long Arc Quad Weight 3 lbs.   Ball Squeeze x20   Marching Limitations 2x10   Marching Weights 3 lbs.  on Rt   Abduction/Adduction  Strengthening;Both;20 reps   Abd/Adduction Limitations red band abduction   Sit to Sand 2 sets;5 reps;with UE support                  PT Short Term Goals - 01/20/17 1439  PT SHORT TERM GOAL #1   Title be independent in initial HEP   Status Achieved     PT SHORT TERM GOAL #3   Title improve strength to perform TUG in < or = to 41 seconds   Baseline 44 seconds   Time 4   Status On-going           PT Long Term Goals - 01/18/17 1456      PT LONG TERM GOAL #1   Title be independent with HEP with assistance from his wife   Time 8   Period Days   Status On-going               Plan - 01/20/17 1456    Clinical Impression Statement Pt improved TUG to 44 seconds today.  Pt with increased time with sit to stand and required stand by assistance with stand to sit for safety today.  Pt able to increase to level 3 on NuStep for 12 minutes today.  Pt will continue to benefit from skilled PT for strength, endurance and functional mobility training to improve safety at home.     Rehab Potential Good   PT Frequency 2x / week   PT Duration 8 weeks   PT Treatment/Interventions  ADLs/Self Care Home Management;Cryotherapy;Electrical Stimulation;Functional mobility training;Stair training;Gait training;Ultrasound;Moist Heat;Therapeutic activities;Therapeutic exercise;Balance training;Neuromuscular re-education;Patient/family education;Passive range of motion;Manual techniques;Taping   PT Next Visit Plan Endurance, LE strength, balance, Begin stairs      Patient will benefit from skilled therapeutic intervention in order to improve the following deficits and impairments:  Abnormal gait, Decreased range of motion, Difficulty walking, Pain, Decreased strength, Decreased balance, Impaired flexibility, Decreased activity tolerance, Decreased endurance, Impaired UE functional use  Visit Diagnosis: Muscle weakness (generalized)  Other abnormalities of gait and mobility     Problem List Patient Active Problem List   Diagnosis Date Noted  . Acute pyelonephritis 09/29/2012  . Nausea 09/29/2012  . Fever 09/29/2012  . HTN (hypertension) 09/29/2012  . H/O: CVA (cerebrovascular accident) 09/29/2012  . Hearing loss 09/29/2012    Sigurd Sos, PT 01/20/17 3:19 PM  [facility] [address] [place], [place], [address] Phone: [phone]   Fax:  [phone]  Name: Robert [name] MRN: [id number] Date of Birth: [birth year]

## 2017-01-25 ENCOUNTER — Ambulatory Visit: Payer: Medicare Other | Attending: Family Medicine | Admitting: Physical Therapy

## 2017-01-25 ENCOUNTER — Encounter: Payer: Self-pay | Admitting: Physical Therapy

## 2017-01-25 DIAGNOSIS — R269 Unspecified abnormalities of gait and mobility: Secondary | ICD-10-CM | POA: Diagnosis not present

## 2017-01-25 DIAGNOSIS — R531 Weakness: Secondary | ICD-10-CM | POA: Insufficient documentation

## 2017-01-25 DIAGNOSIS — M6281 Muscle weakness (generalized): Secondary | ICD-10-CM | POA: Diagnosis not present

## 2017-01-25 DIAGNOSIS — R2689 Other abnormalities of gait and mobility: Secondary | ICD-10-CM | POA: Insufficient documentation

## 2017-01-25 DIAGNOSIS — R29898 Other symptoms and signs involving the musculoskeletal system: Secondary | ICD-10-CM

## 2017-01-25 NOTE — Therapy (Signed)
[facility] [address] [place], [place], [address] Phone: [phone]   Fax:  [phone]  Physical Therapy Treatment  Patient Details  Name: Robert [name] MRN: [id number] Date of Birth: 01-26-1946 Referring Provider: [name], MD  Encounter Date: 01/25/2017      PT End of Session - 01/25/17 1452    Visit Number 6   Number of Visits 10   Date for PT Re-Evaluation 02/10/17   PT Start Time 9476   PT Stop Time 1530   PT Time Calculation (min) 48 min   Activity Tolerance Patient tolerated treatment well   Behavior During Therapy [facility] for tasks assessed/performed      Past Medical History:  Diagnosis Date  . Frequency of urination   . GERD (gastroesophageal reflux disease)   . Gross hematuria   . History of acute pyelonephritis    10-13-2012  . History of CVA with residual deficit    2008--  left side of body weakness and foot drop (wears leg brace and uses cane)  . History of DVT of lower extremity    2008--  cva  . Hyperlipidemia   . Hypertension   . 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 Urethral stricture   . Urgency of urination   . Weakness of left side of body    secondary to cva 2008  . Wears glasses   . Wears hearing aid    bilateral-- wears intermittantly    Past Surgical History:  Procedure Laterality Date  . CYSTOSCOPY WITH RETROGRADE URETHROGRAM N/A 10/23/2015   Procedure: CYSTOSCOPY WITH RETROGRADE URETHROGRAM;  Surgeon: [name], MD;  Location: [facility];  Service: Urology;  Laterality: N/A;  . CYSTOSCOPY WITH URETHRAL DILATATION N/A 10/23/2015   Procedure: CYSTOSCOPY WITH URETHRAL BALLOON DILATATION;  Surgeon: [name], MD;  Location: [facility];  Service: Urology;  Laterality: N/A;  BALLOON DILATION   . IVC FILTER PLACEMENT ([place] HX)  2008    There were no vitals filed  for this visit.      Subjective Assessment - 01/25/17 1452    Subjective Pt reports doing well today.    Pertinent History CVA 2008   How long can you stand comfortably? 10 minutes   How long can you walk comfortably? < 5 minutes   Patient Stated Goals get stronger, walk with wife in park, descend steps without assistance for Lt LE   Currently in Pain? No/denies   Pain Score 0-No pain                         [facility] Adult PT Treatment/Exercise - 01/25/17 0001      Knee/Hip Exercises: Aerobic   Nustep Level 3x 5minutes (Legs and Rt UE only)  Therapist present to discuss treatment     Knee/Hip Exercises: Standing   Other Standing Knee Exercises Reaching for cones for balance   Other Standing Knee Exercises Weight shifting 3 directions  gait belt, CGA, on flat surface     Knee/Hip Exercises: Seated   Sit to Sand 2 sets;5 reps;with UE support                  PT Short Term Goals - 01/25/17 1521      PT SHORT TERM GOAL #2   Title go down steps with moderate  to minimal assistance to guide his left leg due to increased strength   Time 4   Period Weeks   Status On-going     PT SHORT TERM GOAL #3   Title improve strength to perform TUG in < or = to 41 seconds   Baseline 44 seconds   Time 4   Period Weeks   Status On-going           PT Long Term Goals - 01/25/17 1522      PT LONG TERM GOAL #1   Title be independent with HEP with assistance from his wife   Time 8   Period Days   Status On-going     PT LONG TERM GOAL #2   Title reduce FOTO to < or = to 40% limitation   Time 8   Period Weeks   Status On-going               Plan - 01/25/17 1518    Clinical Impression Statement Pt able to stand and performed cone reaching and weight shifting today. Able to complete all seated exercises well with no pain and minmal difficulty. Pt will continue to benefit from skilled therapy for LE strength and endurance.    Rehab Potential Good    PT Frequency 2x / week   PT Duration 8 weeks   PT Treatment/Interventions ADLs/Self Care Home Management;Cryotherapy;Electrical Stimulation;Functional mobility training;Stair training;Gait training;Ultrasound;Moist Heat;Therapeutic activities;Therapeutic exercise;Balance training;Neuromuscular re-education;Patient/family education;Passive range of motion;Manual techniques;Taping   PT Next Visit Plan Endurance, LE strength, balance, Begin stairs   Consulted and Agree with Plan of Care Patient      Patient will benefit from skilled therapeutic intervention in order to improve the following deficits and impairments:  Abnormal gait, Decreased range of motion, Difficulty walking, Pain, Decreased strength, Decreased balance, Impaired flexibility, Decreased activity tolerance, Decreased endurance, Impaired UE functional use  Visit Diagnosis: Muscle weakness (generalized)  Other abnormalities of gait and mobility  Weakness  Abnormality of gait  Weakness of left lower extremity     Problem List Patient Active Problem List   Diagnosis Date Noted  . Acute pyelonephritis 09/29/2012  . Nausea 09/29/2012  . Fever 09/29/2012  . HTN (hypertension) 09/29/2012  . H/O: CVA (cerebrovascular accident) 09/29/2012  . Hearing loss 09/29/2012    Mikle Bosworth PTA 01/25/2017, 4:20 PM  [facility] [address], [place], [place], [address] Phone: [phone]   Fax:  [phone]  Name: Robert [name] MRN: [id number] Date of Birth: 07/12/1946

## 2017-02-01 ENCOUNTER — Ambulatory Visit: Payer: Medicare Other

## 2017-02-01 DIAGNOSIS — R2689 Other abnormalities of gait and mobility: Secondary | ICD-10-CM | POA: Diagnosis not present

## 2017-02-01 DIAGNOSIS — M6281 Muscle weakness (generalized): Secondary | ICD-10-CM | POA: Diagnosis not present

## 2017-02-01 DIAGNOSIS — R29898 Other symptoms and signs involving the musculoskeletal system: Secondary | ICD-10-CM | POA: Diagnosis not present

## 2017-02-01 DIAGNOSIS — R531 Weakness: Secondary | ICD-10-CM | POA: Diagnosis not present

## 2017-02-01 DIAGNOSIS — R269 Unspecified abnormalities of gait and mobility: Secondary | ICD-10-CM | POA: Diagnosis not present

## 2017-02-01 NOTE — Therapy (Signed)
[facility] [address], [place], [place], [address] Phone: [phone]   Fax:  [phone]  Physical Therapy Treatment  Patient Details  Name: Lonnel Gjerde MRN: [id number] Date of Birth: 1946-01-21 Referring Provider: [name], MD  Encounter Date: 02/01/2017      PT End of Session - 02/01/17 1520    Visit Number 7   Number of Visits 10   Date for PT Re-Evaluation 02/10/17   PT Start Time 0932   PT Stop Time 1533   PT Time Calculation (min) 45 min   Activity Tolerance Patient tolerated treatment well   Behavior During Therapy [facility] for tasks assessed/performed      Past Medical History:  Diagnosis Date  . Frequency of urination   . GERD (gastroesophageal reflux disease)   . Gross hematuria   . History of acute pyelonephritis    10-13-2012  . History of CVA with residual deficit    2008--  left side of body weakness and foot drop (wears leg brace and uses cane)  . History of DVT of lower extremity    2008--  cva  . Hyperlipidemia   . Hypertension   . 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 Urethral stricture   . Urgency of urination   . Weakness of left side of body    secondary to cva 2008  . Wears glasses   . Wears hearing aid    bilateral-- wears intermittantly    Past Surgical History:  Procedure Laterality Date  . CYSTOSCOPY WITH RETROGRADE URETHROGRAM N/A 10/23/2015   Procedure: CYSTOSCOPY WITH RETROGRADE URETHROGRAM;  Surgeon: [name], MD;  Location: [facility];  Service: Urology;  Laterality: N/A;  . CYSTOSCOPY WITH URETHRAL DILATATION N/A 10/23/2015   Procedure: CYSTOSCOPY WITH URETHRAL BALLOON DILATATION;  Surgeon: [name], MD;  Location: [facility];  Service: Urology;  Laterality: N/A;  BALLOON DILATION   . IVC FILTER PLACEMENT ([employer] HX)  2008    There were no vitals filed  for this visit.      Subjective Assessment - 02/01/17 1453    Subjective Steps continue to be a challenge.  Pt strained his Rt arm so didn't go up or down steps this weekend.     Pertinent History CVA 2008   Currently in Pain? No/denies                         [facility] Adult PT Treatment/Exercise - 02/01/17 0001      Knee/Hip Exercises: Aerobic   Nustep Level 3x 37minutes (Legs and Rt UE only)  Therapist present to discuss treatment     Knee/Hip Exercises: Standing   Forward Step Up Right;2 sets;10 reps;Hand Hold: 1  decreased control of Lt foot placement with stepping off    Other Standing Knee Exercises Reaching for cones for balance   Other Standing Knee Exercises Weight shifting 3 directions  gait belt, CGA, on flat surface     Knee/Hip Exercises: Seated   Long Arc Quad Strengthening;Right;2 sets;10 reps  heel slides using slider under Lt foot x 10   Long Arc Quad Weight 3 lbs.   Marching Limitations 2x10   Marching Weights 3 lbs.  on Rt   Sit to Sand 2 sets;5 reps;with UE support  PT Short Term Goals - 02/01/17 1455      PT SHORT TERM GOAL #2   Title go down steps with moderate to minimal assistance to guide his left leg due to increased strength   Time 4   Period Weeks   Status On-going     PT SHORT TERM GOAL #3   Title improve strength to perform TUG in < or = to 41 seconds   Baseline 44 seconds   Time 4   Period Weeks   Status On-going           PT Long Term Goals - 02/01/17 1456      PT LONG TERM GOAL #3   Title walk in park for 30 min with 2 rests using the cane   Time 8   Period Weeks   Status On-going     PT LONG TERM GOAL #5   Title perform sit to stand with supervision due to increased LE strength   Time 8   Period Days   Status On-going               Plan - 02/01/17 1457    Clinical Impression Statement Pt strained his Rt arm so didn't leave the house this weekend as steps are  challenging when he can't use Rt UE to assist.  Pt with continued LE weakness and difficulty with transfers. Pt difficulty with control of Lt LE with stepping back after step-ups today.  Pt performed more exercise in standing today to work on balance and endurance.  Pt will continue to benefit from skilled PT for LE strength, endurance and balance activities.     Rehab Potential Good   PT Frequency 2x / week   PT Duration 8 weeks   PT Treatment/Interventions ADLs/Self Care Home Management;Cryotherapy;Electrical Stimulation;Functional mobility training;Stair training;Gait training;Ultrasound;Moist Heat;Therapeutic activities;Therapeutic exercise;Balance training;Neuromuscular re-education;Patient/family education;Passive range of motion;Manual techniques;Taping   PT Next Visit Plan Endurance, LE strength, balance, Begin stairs   Consulted and Agree with Plan of Care Patient      Patient will benefit from skilled therapeutic intervention in order to improve the following deficits and impairments:  Abnormal gait, Decreased range of motion, Difficulty walking, Pain, Decreased strength, Decreased balance, Impaired flexibility, Decreased activity tolerance, Decreased endurance, Impaired UE functional use  Visit Diagnosis: Muscle weakness (generalized)  Other abnormalities of gait and mobility     Problem List Patient Active Problem List   Diagnosis Date Noted  . Acute pyelonephritis 09/29/2012  . Nausea 09/29/2012  . Fever 09/29/2012  . HTN (hypertension) 09/29/2012  . H/O: CVA (cerebrovascular accident) 09/29/2012  . Hearing loss 09/29/2012   Sigurd Sos, PT 02/01/17 3:37 PM  [facility] [address] [place], [place], [address] Phone: [phone]   Fax:  [phone]  Name: Raynald Rouillard MRN: [id number] Date of Birth: July 19, 1946

## 2017-02-03 ENCOUNTER — Encounter: Payer: Self-pay | Admitting: Physical Therapy

## 2017-02-03 ENCOUNTER — Ambulatory Visit: Payer: Medicare Other | Admitting: Physical Therapy

## 2017-02-03 DIAGNOSIS — R2689 Other abnormalities of gait and mobility: Secondary | ICD-10-CM | POA: Diagnosis not present

## 2017-02-03 DIAGNOSIS — M6281 Muscle weakness (generalized): Secondary | ICD-10-CM | POA: Diagnosis not present

## 2017-02-03 DIAGNOSIS — R269 Unspecified abnormalities of gait and mobility: Secondary | ICD-10-CM | POA: Diagnosis not present

## 2017-02-03 DIAGNOSIS — R531 Weakness: Secondary | ICD-10-CM

## 2017-02-03 DIAGNOSIS — R29898 Other symptoms and signs involving the musculoskeletal system: Secondary | ICD-10-CM

## 2017-02-03 NOTE — Patient Instructions (Signed)
Shoulder Shrug    Bring shoulders up toward ears. Hold __5__ seconds. Relax. Repeat _10___ times. Do _2___ sessions per day.  http://gt2.exer.us/12   Copyright  VHI. All rights reserved.   Shoulder Blade Squeeze    Rotate shoulders back, then squeeze shoulder blades together. Repeat _10___ times. Do __2__ sessions per day.  http://gt2.exer.us/846   Copyright  VHI. All rights reserved.    With hand and forearm supported, raise elbow out to side. Hold __2__ seconds. Repeat __10_ times. Do __2__ sessions per day.  Copyright  VHI. All rights reserved.   Robert [name], PTA 02/03/17 3:04 PM  [facility] [address], [place], [place] [address] Phone # [phone] Fax [phone] (Assistive)

## 2017-02-03 NOTE — Therapy (Signed)
[facility] [address] [place], [place], [address] Phone: [phone]   Fax:  [phone]  Physical Therapy Treatment  Patient Details  Name: Robert [name] MRN: [id number] Date of Birth: 1946/05/09 Referring Provider: [name], MD  Encounter Date: 02/03/2017      PT End of Session - 02/03/17 1456    Visit Number 8   Number of Visits 10   Date for PT Re-Evaluation 02/10/17   PT Start Time 9675   PT Stop Time 1530   PT Time Calculation (min) 46 min   Activity Tolerance Patient tolerated treatment well   Behavior During Therapy [facility] for tasks assessed/performed      Past Medical History:  Diagnosis Date  . Frequency of urination   . GERD (gastroesophageal reflux disease)   . Gross hematuria   . History of acute pyelonephritis    10-13-2012  . History of CVA with residual deficit    2008--  left side of body weakness and foot drop (wears leg brace and uses cane)  . History of DVT of lower extremity    2008--  cva  . Hyperlipidemia   . Hypertension   . 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 Urethral stricture   . Urgency of urination   . Weakness of left side of body    secondary to cva 2008  . Wears glasses   . Wears hearing aid    bilateral-- wears intermittantly    Past Surgical History:  Procedure Laterality Date  . CYSTOSCOPY WITH RETROGRADE URETHROGRAM N/A 10/23/2015   Procedure: CYSTOSCOPY WITH RETROGRADE URETHROGRAM;  Surgeon: [name], MD;  Location: [facility];  Service: Urology;  Laterality: N/A;  . CYSTOSCOPY WITH URETHRAL DILATATION N/A 10/23/2015   Procedure: CYSTOSCOPY WITH URETHRAL BALLOON DILATATION;  Surgeon: [name], MD;  Location: [facility];  Service: Urology;  Laterality: N/A;  BALLOON DILATION   . IVC FILTER PLACEMENT ([place] HX)  2008    There were no vitals filed  for this visit.      Subjective Assessment - 02/03/17 1455    Subjective Pt continues to be unable to weight bear on Rt arm. Knee doing ok, denies pain prior to treatment   Pertinent History CVA 2008   How long can you stand comfortably? 10 minutes   How long can you walk comfortably? < 5 minutes   Patient Stated Goals get stronger, walk with wife in park, descend steps without assistance for Lt LE   Currently in Pain? No/denies   Pain Score 0-No pain                         [facility] Adult PT Treatment/Exercise - 02/03/17 0001      Therapeutic Activites    Therapeutic Activities ADL's   ADL's standing, stairs, transfers     Knee/Hip Exercises: Aerobic   Nustep Level 3x 57minutes (Legs and Rt UE only)  Therapist present to discuss treatment     Knee/Hip Exercises: Standing   Hip Flexion AROM;Both;1 set;15 reps  Standing marching   Forward Step Up Right;2 sets;10 reps;Hand Hold: 1  decreased control of Lt foot placement with stepping off    Other Standing Knee Exercises Weight shifting 3 directions  gait belt, CGA, on flat surface     Knee/Hip Exercises: Seated  Long Arc [employer] Strengthening;Right;2 sets;10 reps  heel slides using slider under Lt foot x 10   Long Arc Quad Weight 4 lbs.   Clamshell with TheraBand Green  x20   Sit to Sand 2 sets;5 reps;with UE support                PT Education - 02/03/17 1513    Education provided Yes   Education Details Shoulder strength per patient request   Person(s) Educated Patient   Methods Explanation;Demonstration;Handout   Comprehension Verbalized understanding          PT Short Term Goals - 02/01/17 1455      PT SHORT TERM GOAL #2   Title go down steps with moderate to minimal assistance to guide his left leg due to increased strength   Time 4   Period Weeks   Status On-going     PT SHORT TERM GOAL #3   Title improve strength to perform TUG in < or = to 41 seconds   Baseline 44 seconds    Time 4   Period Weeks   Status On-going           PT Long Term Goals - 02/01/17 1456      PT LONG TERM GOAL #3   Title walk in park for 30 min with 2 rests using the cane   Time 8   Period Weeks   Status On-going     PT LONG TERM GOAL #5   Title perform sit to stand with supervision due to increased LE strength   Time 8   Period Days   Status On-going               Plan - 02/03/17 1655    Clinical Impression Statement Pt with continued pain in Rt arm limiting weight bearing through UE. Pt practiced step ups at stairs needing Min assistance and verbal cues for Lt LE. Pt able to perform standing hip abduction and extension to Lt hip to improve control for stairs but Rt hip fatigues quickly. Pt will continue to benefit from skilled therapy for Bil LE strength, balance, and improvement of safety with ADLs.    Rehab Potential Good   PT Frequency 2x / week   PT Duration 8 weeks   PT Treatment/Interventions ADLs/Self Care Home Management;Cryotherapy;Electrical Stimulation;Functional mobility training;Stair training;Gait training;Ultrasound;Moist Heat;Therapeutic activities;Therapeutic exercise;Balance training;Neuromuscular re-education;Patient/family education;Passive range of motion;Manual techniques;Taping   PT Next Visit Plan Endurance, standing strength   Consulted and Agree with Plan of Care Patient      Patient will benefit from skilled therapeutic intervention in order to improve the following deficits and impairments:  Abnormal gait, Decreased range of motion, Difficulty walking, Pain, Decreased strength, Decreased balance, Impaired flexibility, Decreased activity tolerance, Decreased endurance, Impaired UE functional use  Visit Diagnosis: Muscle weakness (generalized)  Other abnormalities of gait and mobility  Weakness  Abnormality of gait  Weakness of left lower extremity     Problem List Patient Active Problem List   Diagnosis Date Noted  . Acute  pyelonephritis 09/29/2012  . Nausea 09/29/2012  . Fever 09/29/2012  . HTN (hypertension) 09/29/2012  . H/O: CVA (cerebrovascular accident) 09/29/2012  . Hearing loss 09/29/2012    Mikle Bosworth PTA 02/03/2017, 4:59 PM  [facility] [address] [place], [place], [address] Phone: [phone]   Fax:  [phone]  Name: Robert [name] MRN: [id number] Date of Birth: 01/21/1946

## 2017-02-08 ENCOUNTER — Ambulatory Visit: Payer: Medicare Other

## 2017-02-08 DIAGNOSIS — M6281 Muscle weakness (generalized): Secondary | ICD-10-CM

## 2017-02-08 DIAGNOSIS — R531 Weakness: Secondary | ICD-10-CM | POA: Diagnosis not present

## 2017-02-08 DIAGNOSIS — R2689 Other abnormalities of gait and mobility: Secondary | ICD-10-CM | POA: Diagnosis not present

## 2017-02-08 DIAGNOSIS — R29898 Other symptoms and signs involving the musculoskeletal system: Secondary | ICD-10-CM | POA: Diagnosis not present

## 2017-02-08 DIAGNOSIS — R269 Unspecified abnormalities of gait and mobility: Secondary | ICD-10-CM | POA: Diagnosis not present

## 2017-02-08 NOTE — Therapy (Signed)
[facility] [address] [place], [place], [address] Phone: [phone]   Fax:  [phone]  Physical Therapy Evaluation  Patient Details  Name: Robert [name] MRN: [id number] Date of Birth: 09/01/1946 Referring Provider: [name], MD  Encounter Date: 02/08/2017      PT End of Session - 02/08/17 1521    Visit Number 9   Number of Visits 19   Date for PT Re-Evaluation 04/05/17   PT Start Time 2671   PT Stop Time 1528   PT Time Calculation (min) 43 min   Activity Tolerance Patient tolerated treatment well   Behavior During Therapy [facility] for tasks assessed/performed      Past Medical History:  Diagnosis Date  . Frequency of urination   . GERD (gastroesophageal reflux disease)   . Gross hematuria   . History of acute pyelonephritis    10-13-2012  . History of CVA with residual deficit    2008--  left side of body weakness and foot drop (wears leg brace and uses cane)  . History of DVT of lower extremity    2008--  cva  . Hyperlipidemia   . Hypertension   . 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 Urethral stricture   . Urgency of urination   . Weakness of left side of body    secondary to cva 2008  . Wears glasses   . Wears hearing aid    bilateral-- wears intermittantly    Past Surgical History:  Procedure Laterality Date  . CYSTOSCOPY WITH RETROGRADE URETHROGRAM N/A 10/23/2015   Procedure: CYSTOSCOPY WITH RETROGRADE URETHROGRAM;  Surgeon: [name], MD;  Location[facility];  Service: Urology;  Laterality: N/A;  . CYSTOSCOPY WITH URETHRAL DILATATION N/A 10/23/2015   Procedure: CYSTOSCOPY WITH URETHRAL BALLOON DILATATION;  Surgeon: [name], MD;  Location: [facility];  Service: Urology;  Laterality: N/A;  BALLOON DILATION   . IVC FILTER PLACEMENT ([place] HX)  2008    There were no vitals filed  for this visit.       Subjective Assessment - 02/08/17 1452    Subjective Doing exercises at home.  Not walking in the park.     Patient Stated Goals get stronger, walk with wife in park, descend steps without assistance for Lt LE   Currently in Pain? No/denies            [facility] PT Assessment - 02/08/17 0001      Assessment   Medical Diagnosis hemiparesis     Prior Function   Level of Independence Needs assistance with ADLs;Independent with household mobility with device;Independent with basic ADLs;Needs assistance with transfers     Cognition   Overall Cognitive Status Within Functional Limits for tasks assessed     Observation/Other Assessments   Focus on Therapeutic Outcomes (FOTO)  65% limitation     Transfers   Sit to Stand From elevated surface;With upper extremity assist;4: Min assist   Stand to Sit 5: Supervision;With upper extremity assist;With armrests;To elevated surface     Ambulation/Gait   Ambulation/Gait Yes   Ambulation Distance (Feet) 100 Feet   Assistive device Straight cane   Gait Pattern Step-to pattern;Decreased arm swing - left;Decreased step length - right;Decreased step length - left;Decreased stance time - left;Decreased dorsiflexion - left;Left steppage;Ataxic     Timed Up and Go Test  TUG Normal TUG   Normal TUG (seconds) 40                   [facility] Adult PT Treatment/Exercise - 02/08/17 0001      Knee/Hip Exercises: Aerobic   Nustep Level 3x 72minutes (Legs and Rt UE only)  Therapist present to discuss treatment     Knee/Hip Exercises: Standing   Hip Flexion AROM;Both;1 set;15 reps  Standing marching   Other Standing Knee Exercises Weight shifting 3 directions  gait belt, CGA, on flat surface     Knee/Hip Exercises: Seated   Long Arc Quad Strengthening;Right;2 sets;10 reps  heel slides using slider under Lt foot x 10   Long Arc Quad Weight 4 lbs.   Clamshell with TheraBand Green  x20   Marching Limitations 2x10    Marching Weights 4 lbs.                  PT Short Term Goals - 02/08/17 1451      PT SHORT TERM GOAL #1   Title be independent in initial HEP   Status Achieved     PT SHORT TERM GOAL #2   Title go down steps with moderate to minimal assistance to guide his left leg due to increased strength   Time 4   Period Weeks   Status On-going     PT SHORT TERM GOAL #3   Title improve strength to perform TUG in < or = to 41 seconds   Baseline 40 seconds   Status Achieved           PT Long Term Goals - 02/08/17 1451      PT LONG TERM GOAL #1   Title be independent with HEP with assistance from his wife   Time 8   Period Days   Status On-going     PT LONG TERM GOAL #3   Title walk in park for 30 min with 2 rests using the cane   Time 8   Period Weeks   Status On-going     PT LONG TERM GOAL #4   Title descend steps with minimal to no assistance with Lt LE due to increased strength   Time 8   Period Weeks   Status On-going     PT LONG TERM GOAL #5   Title perform sit to stand with supervision due to increased LE strength   Time 8   Period Days   Status On-going               Plan - 02/08/17 1454    Clinical Impression Statement Pt performed TUG in 40 seconds today.  Pt with less UE support with sit to stand transition.  Sometimes requres assistance with sit to stand.  Pt walked with his wife for 20 minutes in the park last week.  Pt continues to require assistance from wife for Lt LE with ascending and descending steps.  Pt will continue to benefit from skilled PT for strength, endurance and gait.     Rehab Potential Good   PT Frequency 2x / week   PT Duration 8 weeks   PT Treatment/Interventions ADLs/Self Care Home Management;Cryotherapy;Electrical Stimulation;Functional mobility training;Stair training;Gait training;Ultrasound;Moist Heat;Therapeutic activities;Therapeutic exercise;Balance training;Neuromuscular re-education;Patient/family  education;Passive range of motion;Manual techniques;Taping   PT Next Visit Plan Endurance, standing strength   Consulted and Agree with Plan of Care Patient      Patient will benefit from skilled therapeutic intervention in order to improve the  following deficits and impairments:  Abnormal gait, Decreased range of motion, Difficulty walking, Pain, Decreased strength, Decreased balance, Impaired flexibility, Decreased activity tolerance, Decreased endurance, Impaired UE functional use  Visit Diagnosis: Muscle weakness (generalized) - Plan: PT plan of care cert/re-cert  Other abnormalities of gait and mobility - Plan: PT plan of care cert/re-cert      G-Codes - 64/38/38 1502    Functional Assessment Tool Used (Outpatient Only) TUG: 40 seconds, FOTO   Functional Limitation Mobility: Walking and moving around   Mobility: Walking and Moving Around Current Status [phone]) At least 40 percent but less than 60 percent impaired, limited or restricted   Mobility: Walking and Moving Around Goal Status [phone]) At least 40 percent but less than 60 percent impaired, limited or restricted       Problem List Patient Active Problem List   Diagnosis Date Noted  . Acute pyelonephritis 09/29/2012  . Nausea 09/29/2012  . Fever 09/29/2012  . HTN (hypertension) 09/29/2012  . H/O: CVA (cerebrovascular accident) 09/29/2012  . Hearing loss 09/29/2012    Robert [name], PT 02/08/17 3:33 PM  [facility] [address] [place], [place], [address] Phone: [phone]   Fax:  [phone]  Name: Robert [name] MRN: [id number] Date of Birth: 1946-08-29

## 2017-02-10 ENCOUNTER — Encounter: Payer: Medicare Other | Admitting: Physical Therapy

## 2017-02-15 ENCOUNTER — Encounter: Payer: Medicare Other | Admitting: Physical Therapy

## 2017-02-22 ENCOUNTER — Ambulatory Visit: Payer: Medicare Other | Admitting: Physical Therapy

## 2017-02-22 ENCOUNTER — Encounter: Payer: Self-pay | Admitting: Physical Therapy

## 2017-02-22 DIAGNOSIS — R269 Unspecified abnormalities of gait and mobility: Secondary | ICD-10-CM

## 2017-02-22 DIAGNOSIS — M6281 Muscle weakness (generalized): Secondary | ICD-10-CM | POA: Diagnosis not present

## 2017-02-22 DIAGNOSIS — R531 Weakness: Secondary | ICD-10-CM | POA: Diagnosis not present

## 2017-02-22 DIAGNOSIS — R29898 Other symptoms and signs involving the musculoskeletal system: Secondary | ICD-10-CM | POA: Diagnosis not present

## 2017-02-22 DIAGNOSIS — R2689 Other abnormalities of gait and mobility: Secondary | ICD-10-CM

## 2017-02-22 NOTE — Therapy (Addendum)
[facility] [address], [place], [place], [address] Phone: [phone]   Fax:  [phone]  Physical Therapy Treatment  Patient Details  Name: Robert [name] MRN: [id number] Date of Birth: 1946/04/22 Referring Provider: [name], MD  Encounter Date: 02/22/2017      PT End of Session - 02/22/17 1519    Visit Number 10   Number of Visits 19   Date for PT Re-Evaluation 04/05/17   PT Start Time 1638   PT Stop Time 1523   PT Time Calculation (min) 45 min   Activity Tolerance Patient tolerated treatment well;Patient limited by fatigue   Behavior During Therapy [facility] for tasks assessed/performed      Past Medical History:  Diagnosis Date  . Frequency of urination   . GERD (gastroesophageal reflux disease)   . Gross hematuria   . History of acute pyelonephritis    10-13-2012  . History of CVA with residual deficit    2008--  left side of body weakness and foot drop (wears leg brace and uses cane)  . History of DVT of lower extremity    2008--  cva  . Hyperlipidemia   . Hypertension   . 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 Urethral stricture   . Urgency of urination   . Weakness of left side of body    secondary to cva 2008  . Wears glasses   . Wears hearing aid    bilateral-- wears intermittantly    Past Surgical History:  Procedure Laterality Date  . CYSTOSCOPY WITH RETROGRADE URETHROGRAM N/A 10/23/2015   Procedure: CYSTOSCOPY WITH RETROGRADE URETHROGRAM;  Surgeon: [name], MD;  Location: [facility];  Service: Urology;  Laterality: N/A;  . CYSTOSCOPY WITH URETHRAL DILATATION N/A 10/23/2015   Procedure: CYSTOSCOPY WITH URETHRAL BALLOON DILATATION;  Surgeon: [name], MD;  Location: [facility];  Service: Urology;  Laterality: N/A;  BALLOON DILATION   . IVC FILTER PLACEMENT ([place] HX)  2008     There were no vitals filed for this visit.      Subjective Assessment - 02/22/17 1447    Subjective No new complaints.  I went walking at the park yesterday.    Pertinent History CVA 2008   How long can you stand comfortably? 10 minutes   How long can you walk comfortably? < 5 minutes   Patient Stated Goals get stronger, walk with wife in park, descend steps without assistance for Lt LE   Currently in Pain? No/denies   Multiple Pain Sites No            [facility] PT Assessment - 02/22/17 0001      Observation/Other Assessments   Focus on Therapeutic Outcomes (FOTO)  61% limitation   Current status CL     Timed Up and Go Test   TUG Normal TUG   Normal TUG (seconds) 29                     [facility] Adult PT Treatment/Exercise - 02/22/17 0001      Knee/Hip Exercises: Aerobic   Nustep L 4 x 10 min  Legs and RTUE only     Knee/Hip Exercises: Standing   Hip Flexion AROM;Both;1 set;15 reps  Standing marching   Other Standing Knee Exercises Weight shifting 3 directions  gait belt, CGA, on flat surface  Knee/Hip Exercises: Seated   Long Arc Quad Strengthening;Right;2 sets;10 reps  heel slides using slider under Lt foot x 10   Long Arc Quad Weight 4 lbs.   Clamshell with TheraBand Green  x20 Pt shakey   Marching Limitations 2x10   Marching Weights 4 lbs.  RTLE only                  PT Short Term Goals - 02/08/17 1451      PT SHORT TERM GOAL #1   Title be independent in initial HEP   Status Achieved     PT SHORT TERM GOAL #2   Title go down steps with moderate to minimal assistance to guide his left leg due to increased strength   Time 4   Period Weeks   Status On-going     PT SHORT TERM GOAL #3   Title improve strength to perform TUG in < or = to 41 seconds   Baseline 40 seconds   Status Achieved           PT Long Term Goals - 02/08/17 1451      PT LONG TERM GOAL #1   Title be independent with HEP with assistance from his wife    Time 8   Period Days   Status On-going     PT LONG TERM GOAL #3   Title walk in park for 30 min with 2 rests using the cane   Time 8   Period Weeks   Status On-going     PT LONG TERM GOAL #4   Title descend steps with minimal to no assistance with Lt LE due to increased strength   Time 8   Period Weeks   Status On-going     PT LONG TERM GOAL #5   Title perform sit to stand with supervision due to increased LE strength   Time 8   Period Days   Status On-going               Plan - 03/16/2017 1442    Clinical Impression Statement Pt appeared very shaky throughout the session. Rest/recovery helpful and reduced any shaking. Decreased TUG time to 29 seconds.    Rehab Potential Good   PT Frequency 2x / week   PT Duration 8 weeks   PT Treatment/Interventions ADLs/Self Care Home Management;Cryotherapy;Electrical Stimulation;Functional mobility training;Stair training;Gait training;Ultrasound;Moist Heat;Therapeutic activities;Therapeutic exercise;Balance training;Neuromuscular re-education;Patient/family education;Passive range of motion;Manual techniques;Taping   PT Next Visit Plan Endurance, standing strength   Consulted and Agree with Plan of Care Patient      Patient will benefit from skilled therapeutic intervention in order to improve the following deficits and impairments:  Abnormal gait, Decreased range of motion, Difficulty walking, Pain, Decreased strength, Decreased balance, Impaired flexibility, Decreased activity tolerance, Decreased endurance, Impaired UE functional use  Visit Diagnosis: Muscle weakness (generalized)  Other abnormalities of gait and mobility  Weakness  Abnormality of gait  Weakness of left lower extremity       G-Codes - 03-16-17 1525    Functional Assessment Tool Used (Outpatient Only) TUG: 29 sec    Mobility and walking Current : CK Goal: CK Problem List Patient Active Problem List   Diagnosis Date Noted  . Acute  pyelonephritis 09/29/2012  . Nausea 09/29/2012  . Fever 09/29/2012  . HTN (hypertension) 09/29/2012  . H/O: CVA (cerebrovascular accident) 09/29/2012  . Hearing loss 09/29/2012    Myrene Galas, PTA 03/16/17 4:08 PM  [facility] [address], [place], [place], [address] Phone: [phone]   Fax:  [phone]  Name: Robert [name] MRN: [id number] Date of Birth: Aug 01, 1946  Zannie Cove, PT 02/22/17 9:02 PM

## 2017-02-24 ENCOUNTER — Encounter: Payer: Self-pay | Admitting: Physical Therapy

## 2017-02-24 ENCOUNTER — Ambulatory Visit: Payer: Medicare Other | Attending: Family Medicine | Admitting: Physical Therapy

## 2017-02-24 DIAGNOSIS — R531 Weakness: Secondary | ICD-10-CM | POA: Insufficient documentation

## 2017-02-24 DIAGNOSIS — R29898 Other symptoms and signs involving the musculoskeletal system: Secondary | ICD-10-CM | POA: Diagnosis not present

## 2017-02-24 DIAGNOSIS — R269 Unspecified abnormalities of gait and mobility: Secondary | ICD-10-CM | POA: Diagnosis not present

## 2017-02-24 DIAGNOSIS — M6281 Muscle weakness (generalized): Secondary | ICD-10-CM | POA: Insufficient documentation

## 2017-02-24 DIAGNOSIS — R2689 Other abnormalities of gait and mobility: Secondary | ICD-10-CM | POA: Diagnosis not present

## 2017-02-24 NOTE — Therapy (Signed)
[facility] [address], [place], [place], [address] Phone: [phone]   Fax:  [phone]  Physical Therapy Treatment  Patient Details  Name: Robert [name] MRN: [id number] Date of Birth: June 02, 1946 Referring Provider: [name], MD  Encounter Date: 02/24/2017      PT End of Session - 02/24/17 1448    Visit Number 11   Number of Visits 19   Date for PT Re-Evaluation 04/05/17   PT Start Time 2229   PT Stop Time 1526   PT Time Calculation (min) 43 min   Activity Tolerance Patient tolerated treatment well;Patient limited by fatigue   Behavior During Therapy [facility] for tasks assessed/performed      Past Medical History:  Diagnosis Date  . Frequency of urination   . GERD (gastroesophageal reflux disease)   . Gross hematuria   . History of acute pyelonephritis    10-13-2012  . History of CVA with residual deficit    2008--  left side of body weakness and foot drop (wears leg brace and uses cane)  . History of DVT of lower extremity    2008--  cva  . Hyperlipidemia   . Hypertension   . 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 Urethral stricture   . Urgency of urination   . Weakness of left side of body    secondary to cva 2008  . Wears glasses   . Wears hearing aid    bilateral-- wears intermittantly    Past Surgical History:  Procedure Laterality Date  . CYSTOSCOPY WITH RETROGRADE URETHROGRAM N/A 10/23/2015   Procedure: CYSTOSCOPY WITH RETROGRADE URETHROGRAM;  Surgeon: [name], MD;  Location[facility]  Service: Urology;  Laterality: N/A;  . CYSTOSCOPY WITH URETHRAL DILATATION N/A 10/23/2015   Procedure: CYSTOSCOPY WITH URETHRAL BALLOON DILATATION;  Surgeon: [name], MD;  Location: [facility];  Service: Urology;  Laterality: N/A;  BALLOON DILATION   . IVC FILTER PLACEMENT ([place] HX)  2008     There were no vitals filed for this visit.      Subjective Assessment - 02/24/17 1446    Subjective Nothing new to report.    Pertinent History CVA 2008   How long can you stand comfortably? 10 minutes   How long can you walk comfortably? < 5 minutes   Patient Stated Goals get stronger, walk with wife in park, descend steps without assistance for Lt LE   Currently in Pain? No/denies   Pain Score 0-No pain                         [facility] Adult PT Treatment/Exercise - 02/24/17 0001      Knee/Hip Exercises: Aerobic   Nustep L 4 x 10 min  Legs and RTUE only     Knee/Hip Exercises: Standing   Hip Flexion AROM;Both;1 set;15 reps  Standing marching   Forward Step Up Both;1 set;10 reps;Step Height: 2"  Gait belt CGA   Other Standing Knee Exercises Ball toss  CGA gait belt     Knee/Hip Exercises: Seated   Heel Slides AROM;Left  forward/ back, side/side   Clamshell with Marga Hoots  x20                  PT Short Term Goals - 02/24/17 1448  PT SHORT TERM GOAL #2   Title go down steps with moderate to minimal assistance to guide his left leg due to increased strength   Time 4   Period Weeks   Status On-going           PT Long Term Goals - 02/24/17 1525      PT LONG TERM GOAL #1   Title be independent with HEP with assistance from his wife   Time 8   Period Days   Status On-going     PT LONG TERM GOAL #4   Title descend steps with minimal to no assistance with Lt LE due to increased strength   Time 8   Period Weeks   Status On-going     PT LONG TERM GOAL #5   Title perform sit to stand with supervision due to increased LE strength   Time 8   Period Days   Status On-going               Plan - 02/24/17 1527    Clinical Impression Statement Patient doing well with stairs, able to raise left leg and lift and lower self up onto step with left leg. Patient states this is how he does it at home. Patient able to do more  AROM exercises in LT LE. Did well with standing ball toss. Patient will continue to benefit from skilled therapy for LE strengthening and balance.    Rehab Potential Good   PT Frequency 2x / week   PT Duration 8 weeks   PT Treatment/Interventions ADLs/Self Care Home Management;Cryotherapy;Electrical Stimulation;Functional mobility training;Stair training;Gait training;Ultrasound;Moist Heat;Therapeutic activities;Therapeutic exercise;Balance training;Neuromuscular re-education;Patient/family education;Passive range of motion;Manual techniques;Taping   PT Next Visit Plan Endurance, standing strength   Consulted and Agree with Plan of Care Patient      Patient will benefit from skilled therapeutic intervention in order to improve the following deficits and impairments:  Abnormal gait, Decreased range of motion, Difficulty walking, Pain, Decreased strength, Decreased balance, Impaired flexibility, Decreased activity tolerance, Decreased endurance, Impaired UE functional use  Visit Diagnosis: Muscle weakness (generalized)  Other abnormalities of gait and mobility  Weakness  Abnormality of gait  Weakness of left lower extremity     Problem List Patient Active Problem List   Diagnosis Date Noted  . Acute pyelonephritis 09/29/2012  . Nausea 09/29/2012  . Fever 09/29/2012  . HTN (hypertension) 09/29/2012  . H/O: CVA (cerebrovascular accident) 09/29/2012  . Hearing loss 09/29/2012    Mikle Bosworth PTA 02/24/2017, 3:43 PM  [facility] [address] [place], [place], [address] Phone: [phone]   Fax:  [phone]  Name: Robert [name] MRN: [id number] Date of Birth: [birth year]

## 2017-03-01 ENCOUNTER — Ambulatory Visit: Payer: Medicare Other

## 2017-03-01 DIAGNOSIS — R29898 Other symptoms and signs involving the musculoskeletal system: Secondary | ICD-10-CM | POA: Diagnosis not present

## 2017-03-01 DIAGNOSIS — R269 Unspecified abnormalities of gait and mobility: Secondary | ICD-10-CM | POA: Diagnosis not present

## 2017-03-01 DIAGNOSIS — R531 Weakness: Secondary | ICD-10-CM | POA: Diagnosis not present

## 2017-03-01 DIAGNOSIS — M6281 Muscle weakness (generalized): Secondary | ICD-10-CM | POA: Diagnosis not present

## 2017-03-01 DIAGNOSIS — R2689 Other abnormalities of gait and mobility: Secondary | ICD-10-CM | POA: Diagnosis not present

## 2017-03-01 NOTE — Therapy (Signed)
[facility] [address] [place], [place], [address] Phone: [phone]   Fax:  [phone]  Physical Therapy Treatment  Patient Details  Name: Robert [name] MRN: [id number] Date of Birth: 01-Feb-1946 Referring Provider: [name], MD  Encounter Date: 03/01/2017      PT End of Session - 03/01/17 1528    Visit Number 12   Number of Visits 19   Date for PT Re-Evaluation 04/05/17   PT Start Time 2035   PT Stop Time 1524   PT Time Calculation (min) 42 min   Activity Tolerance Patient tolerated treatment well   Behavior During Therapy [facility] for tasks assessed/performed      Past Medical History:  Diagnosis Date  . Frequency of urination   . GERD (gastroesophageal reflux disease)   . Gross hematuria   . History of acute pyelonephritis    10-13-2012  . History of CVA with residual deficit    2008--  left side of body weakness and foot drop (wears leg brace and uses cane)  . History of DVT of lower extremity    2008--  cva  . Hyperlipidemia   . Hypertension   . 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 Urethral stricture   . Urgency of urination   . Weakness of left side of body    secondary to cva 2008  . Wears glasses   . Wears hearing aid    bilateral-- wears intermittantly    Past Surgical History:  Procedure Laterality Date  . CYSTOSCOPY WITH RETROGRADE URETHROGRAM N/A 10/23/2015   Procedure: CYSTOSCOPY WITH RETROGRADE URETHROGRAM;  Surgeon: [name], MD;  Location: [facility];  Service: Urology;  Laterality: N/A;  . CYSTOSCOPY WITH URETHRAL DILATATION N/A 10/23/2015   Procedure: CYSTOSCOPY WITH URETHRAL BALLOON DILATATION;  Surgeon: [name], MD;  Location: [facility];  Service: Urology;  Laterality: N/A;  BALLOON DILATION   . IVC FILTER PLACEMENT ([place] HX)  2008    There were no vitals filed  for this visit.                       [facility] Adult PT Treatment/Exercise - 03/01/17 0001      Knee/Hip Exercises: Aerobic   Nustep L 4 x 10 min  Legs and RTUE only     Knee/Hip Exercises: Standing   Hip Flexion AROM;Both;1 set;15 reps  Standing marching   Other Standing Knee Exercises Ball toss  CGA gait belt     Knee/Hip Exercises: Seated   Long Arc Quad Strengthening;Right;2 sets;10 reps  heel slides using slider under Lt foot x 10   Long Arc Quad Weight 4 lbs.   Heel Slides AROM;Left  forward/ back, side/side   Clamshell with TheraBand Green  x20   Marching Limitations 2x10   Marching Weights 4 lbs.  RTLE only                  PT Short Term Goals - 02/24/17 1448      PT SHORT TERM GOAL #2   Title go down steps with moderate to minimal assistance to guide his left leg due to increased strength   Time 4   Period Weeks   Status On-going           PT Long Term Goals - 03/01/17 1452  PT LONG TERM GOAL #1   Title be independent with HEP with assistance from his wife   Time 8   Period Weeks   Status On-going     PT LONG TERM GOAL #2   Title reduce FOTO to < or = to 40% limitation   Time 8   Period Weeks   Status On-going     PT LONG TERM GOAL #3   Title walk in park for 30 min with 2 rests using the cane     PT LONG TERM GOAL #4   Title descend steps with minimal to no assistance with Lt LE due to increased strength   Time 8   Period Weeks   Status On-going     PT LONG TERM GOAL #5   Title perform sit to stand with supervision due to increased LE strength   Baseline supervision to minimal assistance   Time 8   Period Weeks   Status On-going               Plan - 03/01/17 1500    Clinical Impression Statement Pt is doing well with steps with increased ability to raise the Lt leg with ascending steps.  Pt with continued difficulty with descending steps.  Pt with Lt weakness that is chronic due to CVA.  Pt able to  perform standing ball toss.  Pt will continue to benefit from skilled PT for LE strength and balance.     Rehab Potential Good   PT Frequency 2x / week   PT Duration 8 weeks   PT Treatment/Interventions ADLs/Self Care Home Management;Cryotherapy;Electrical Stimulation;Functional mobility training;Stair training;Gait training;Ultrasound;Moist Heat;Therapeutic activities;Therapeutic exercise;Balance training;Neuromuscular re-education;Patient/family education;Passive range of motion;Manual techniques;Taping   PT Next Visit Plan Endurance, standing strength   Consulted and Agree with Plan of Care Patient      Patient will benefit from skilled therapeutic intervention in order to improve the following deficits and impairments:  Abnormal gait, Decreased range of motion, Difficulty walking, Pain, Decreased strength, Decreased balance, Impaired flexibility, Decreased activity tolerance, Decreased endurance, Impaired UE functional use  Visit Diagnosis: Muscle weakness (generalized)  Other abnormalities of gait and mobility     Problem List Patient Active Problem List   Diagnosis Date Noted  . Acute pyelonephritis 09/29/2012  . Nausea 09/29/2012  . Fever 09/29/2012  . HTN (hypertension) 09/29/2012  . H/O: CVA (cerebrovascular accident) 09/29/2012  . Hearing loss 09/29/2012     Sigurd Sos, PT 03/01/17 3:30 PM  [facility] [address], [place], [place], [address] Phone: [phone]   Fax:  [phone]  Name: Ramello Cordial MRN: [id number] Date of Birth: 02-10-1946

## 2017-03-03 ENCOUNTER — Ambulatory Visit: Payer: Medicare Other | Admitting: Physical Therapy

## 2017-03-03 ENCOUNTER — Encounter: Payer: Self-pay | Admitting: Physical Therapy

## 2017-03-03 DIAGNOSIS — R269 Unspecified abnormalities of gait and mobility: Secondary | ICD-10-CM | POA: Diagnosis not present

## 2017-03-03 DIAGNOSIS — R2689 Other abnormalities of gait and mobility: Secondary | ICD-10-CM | POA: Diagnosis not present

## 2017-03-03 DIAGNOSIS — M6281 Muscle weakness (generalized): Secondary | ICD-10-CM | POA: Diagnosis not present

## 2017-03-03 DIAGNOSIS — R29898 Other symptoms and signs involving the musculoskeletal system: Secondary | ICD-10-CM | POA: Diagnosis not present

## 2017-03-03 DIAGNOSIS — R531 Weakness: Secondary | ICD-10-CM | POA: Diagnosis not present

## 2017-03-03 NOTE — Therapy (Signed)
[facility] [address] [place], [place], [address] Phone: [phone]   Fax:  [phone]  Physical Therapy Treatment  Patient Details  Name: Robert [name] MRN: [id number] Date of Birth: 1946-10-07 Referring Provider: [name], MD  Encounter Date: 03/03/2017      PT End of Session - 03/03/17 1451    Visit Number 13   Number of Visits 19   Date for PT Re-Evaluation 04/05/17   PT Start Time 3532   PT Stop Time 1525   PT Time Calculation (min) 40 min   Activity Tolerance Patient tolerated treatment well   Behavior During Therapy [facility] for tasks assessed/performed      Past Medical History:  Diagnosis Date  . Frequency of urination   . GERD (gastroesophageal reflux disease)   . Gross hematuria   . History of acute pyelonephritis    10-13-2012  . History of CVA with residual deficit    2008--  left side of body weakness and foot drop (wears leg brace and uses cane)  . History of DVT of lower extremity    2008--  cva  . Hyperlipidemia   . Hypertension   . 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 Urethral stricture   . Urgency of urination   . Weakness of left side of body    secondary to cva 2008  . Wears glasses   . Wears hearing aid    bilateral-- wears intermittantly    Past Surgical History:  Procedure Laterality Date  . CYSTOSCOPY WITH RETROGRADE URETHROGRAM N/A 10/23/2015   Procedure: CYSTOSCOPY WITH RETROGRADE URETHROGRAM;  Surgeon: [name], MD;  Location: [facility];  Service: Urology;  Laterality: N/A;  . CYSTOSCOPY WITH URETHRAL DILATATION N/A 10/23/2015   Procedure: CYSTOSCOPY WITH URETHRAL BALLOON DILATATION;  Surgeon: [name], MD;  Location: [facility];  Service: Urology;  Laterality: N/A;  BALLOON DILATION   . IVC FILTER PLACEMENT ([place] HX)  2008    There were no vitals filed  for this visit.      Subjective Assessment - 03/03/17 1450    Subjective Patient reports having increased difficulty getting back up stairs to house after last visit. Patient wife requests to take it easy today because patient is already tiered and will need to be able to get back up stairs after leaving.    Pertinent History CVA 2008   How long can you stand comfortably? 10 minutes   How long can you walk comfortably? < 5 minutes   Patient Stated Goals get stronger, walk with wife in park, descend steps without assistance for Lt LE   Currently in Pain? No/denies   Pain Score 0-No pain                         [facility] Adult PT Treatment/Exercise - 03/03/17 0001      Knee/Hip Exercises: Aerobic   Nustep L2 x10 minutes  Decreased resistance for today per patient request     Knee/Hip Exercises: Standing   Hip Flexion AROM;Both;1 set;15 reps  Standing marching   Other Standing Knee Exercises Ball toss  CGA gait belt     Knee/Hip Exercises: Seated   Long Arc Quad Strengthening;Right;2 sets;10 reps  heel slides using slider under Lt foot x 10   Long Arc Quad Weight 4 lbs.  Heel Slides AROM;Left  forward/ back, side/side   Clamshell with TheraBand Green  x20   Marching Limitations 2x10   Marching Weights 4 lbs.   Sit to Sand 2 sets;5 reps;with UE support                  PT Short Term Goals - 03/03/17 1451      PT SHORT TERM GOAL #2   Title go down steps with moderate to minimal assistance to guide his left leg due to increased strength   Time 4   Period Weeks   Status On-going           PT Long Term Goals - 03/03/17 1452      PT LONG TERM GOAL #1   Title be independent with HEP with assistance from his wife   Time 8   Period Weeks   Status On-going     PT LONG TERM GOAL #2   Title reduce FOTO to < or = to 40% limitation   Time 8   Period Weeks   Status On-going               Plan - 03/03/17 1708    Clinical Impression  Statement Patient's reports patient had difficulty getting up stairs today so wanting to keep it light today. Patient able to tolerate some standing exercises and decreased weights with strenghtening. Patient felt well after session. Patient will continue to benefit from skilled thearpy for strengthening and balance.   Rehab Potential Good   PT Frequency 2x / week   PT Duration 8 weeks   PT Treatment/Interventions ADLs/Self Care Home Management;Cryotherapy;Electrical Stimulation;Functional mobility training;Stair training;Gait training;Ultrasound;Moist Heat;Therapeutic activities;Therapeutic exercise;Balance training;Neuromuscular re-education;Patient/family education;Passive range of motion;Manual techniques;Taping   PT Next Visit Plan Endurance, standing strength   Consulted and Agree with Plan of Care Patient      Patient will benefit from skilled therapeutic intervention in order to improve the following deficits and impairments:  Abnormal gait, Decreased range of motion, Difficulty walking, Pain, Decreased strength, Decreased balance, Impaired flexibility, Decreased activity tolerance, Decreased endurance, Impaired UE functional use  Visit Diagnosis: Muscle weakness (generalized)  Other abnormalities of gait and mobility  Weakness  Abnormality of gait  Weakness of left lower extremity     Problem List Patient Active Problem List   Diagnosis Date Noted  . Acute pyelonephritis 09/29/2012  . Nausea 09/29/2012  . Fever 09/29/2012  . HTN (hypertension) 09/29/2012  . H/O: CVA (cerebrovascular accident) 09/29/2012  . Hearing loss 09/29/2012    Mikle Bosworth PTA 03/03/2017, 5:09 PM  [facility] [address], [place], [place], [address] Phone: [phone]   Fax:  [phone]  Name: Robert [name] MRN: [id number] Date of Birth: 04/30/1946

## 2017-03-08 ENCOUNTER — Ambulatory Visit: Payer: Medicare Other | Admitting: Physical Therapy

## 2017-03-08 ENCOUNTER — Encounter: Payer: Self-pay | Admitting: Physical Therapy

## 2017-03-08 DIAGNOSIS — R269 Unspecified abnormalities of gait and mobility: Secondary | ICD-10-CM | POA: Diagnosis not present

## 2017-03-08 DIAGNOSIS — R531 Weakness: Secondary | ICD-10-CM | POA: Diagnosis not present

## 2017-03-08 DIAGNOSIS — R2689 Other abnormalities of gait and mobility: Secondary | ICD-10-CM | POA: Diagnosis not present

## 2017-03-08 DIAGNOSIS — R29898 Other symptoms and signs involving the musculoskeletal system: Secondary | ICD-10-CM

## 2017-03-08 DIAGNOSIS — M6281 Muscle weakness (generalized): Secondary | ICD-10-CM

## 2017-03-08 NOTE — Therapy (Signed)
[facility] [address] [place], [place], [address] Phone: [phone]   Fax:  [phone]  Physical Therapy Treatment  Patient Details  Name: Robert [name] MRN: [id number] Date of Birth: [birth year] Referring Provider: [name], MD  Encounter Date: 03/08/2017      PT End of Session - 03/08/17 1456    Visit Number 14   Number of Visits 19   Date for PT Re-Evaluation 04/05/17   PT Start Time 0867   PT Stop Time 1532   PT Time Calculation (min) 48 min   Activity Tolerance Patient tolerated treatment well   Behavior During Therapy [facility] for tasks assessed/performed      Past Medical History:  Diagnosis Date  . Frequency of urination   . GERD (gastroesophageal reflux disease)   . Gross hematuria   . History of acute pyelonephritis    10-13-2012  . History of CVA with residual deficit    2008--  left side of body weakness and foot drop (wears leg brace and uses cane)  . History of DVT of lower extremity    2008--  cva  . Hyperlipidemia   . Hypertension   . 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 Urethral stricture   . Urgency of urination   . Weakness of left side of body    secondary to cva 2008  . Wears glasses   . Wears hearing aid    bilateral-- wears intermittantly    Past Surgical History:  Procedure Laterality Date  . CYSTOSCOPY WITH RETROGRADE URETHROGRAM N/A 10/23/2015   Procedure: CYSTOSCOPY WITH RETROGRADE URETHROGRAM;  Surgeon: [name], MD;  Location: [facility];  Service: Urology;  Laterality: N/A;  . CYSTOSCOPY WITH URETHRAL DILATATION N/A 10/23/2015   Procedure: CYSTOSCOPY WITH URETHRAL BALLOON DILATATION;  Surgeon: [name], MD;  Location: [facility];  Service: Urology;  Laterality: N/A;  BALLOON DILATION   . IVC FILTER PLACEMENT ([place] HX)  2008    There were no vitals filed  for this visit.      Subjective Assessment - 03/08/17 1457    Subjective No complaints today, just hot.   Pertinent History CVA 2008   How long can you stand comfortably? 10 minutes   How long can you walk comfortably? < 5 minutes   Patient Stated Goals get stronger, walk with wife in park, descend steps without assistance for Lt LE   Currently in Pain? No/denies   Multiple Pain Sites No                         [facility] Adult PT Treatment/Exercise - 03/08/17 0001      Ambulation/Gait   Ambulation/Gait Yes   Ambulation/Gait Assistance 6: Modified independent (Device/Increase time)  160 feet   Assistive device Straight cane   Gait Pattern Step-through pattern;Decreased arm swing - left;Decreased step length - left;Decreased stride length;Decreased dorsiflexion - left;Decreased weight shift to left   Ambulation Surface Level   Gait velocity Slow     Knee/Hip Exercises: Aerobic   Nustep L2 12 min  PTA monitored     Knee/Hip Exercises: Seated   Long Arc Quad Strengthening;Both;2 sets;10 reps  LT 1#, RT 4#   Long Arc Quad Weight --  used slider under LT for 10x   [employer] with  TheraBand --  green 3x10                  PT Short Term Goals - 03/08/17 1457      PT SHORT TERM GOAL #2   Title go down steps with moderate to minimal assistance to guide his left leg due to increased strength   Time 4   Period Weeks   Status On-going  Wife gives maximal guidance           PT Long Term Goals - 03/03/17 1452      PT LONG TERM GOAL #1   Title be independent with HEP with assistance from his wife   Time 8   Period Weeks   Status On-going     PT LONG TERM GOAL #2   Title reduce FOTO to < or = to 40% limitation   Time 8   Period Weeks   Status On-going               Plan - 03/08/17 1456    Clinical Impression Statement Wife reports she continues to give max guidance going downstairs. Pt's LT foot does not seem to be  lifting much at all during gait training. He reports the foot feels heavier than it did. Wife also agreed with this assessment.  They were encouraged to share this observation with pt's MD at his next appt or commuinicate this to MD if it contined and not wait for an appt.    Rehab Potential Good   PT Frequency 2x / week   PT Duration 8 weeks   PT Treatment/Interventions ADLs/Self Care Home Management;Cryotherapy;Electrical Stimulation;Functional mobility training;Stair training;Gait training;Ultrasound;Moist Heat;Therapeutic activities;Therapeutic exercise;Balance training;Neuromuscular re-education;Patient/family education;Passive range of motion;Manual techniques;Taping   PT Next Visit Plan Endurance, standing strength   Consulted and Agree with Plan of Care Patient      Patient will benefit from skilled therapeutic intervention in order to improve the following deficits and impairments:  Abnormal gait, Decreased range of motion, Difficulty walking, Pain, Decreased strength, Decreased balance, Impaired flexibility, Decreased activity tolerance, Decreased endurance, Impaired UE functional use  Visit Diagnosis: Muscle weakness (generalized)  Other abnormalities of gait and mobility  Weakness  Abnormality of gait  Weakness of left lower extremity     Problem List Patient Active Problem List   Diagnosis Date Noted  . Acute pyelonephritis 09/29/2012  . Nausea 09/29/2012  . Fever 09/29/2012  . HTN (hypertension) 09/29/2012  . H/O: CVA (cerebrovascular accident) 09/29/2012  . Hearing loss 09/29/2012    Teleah Villamar, PTA 03/08/2017, 3:38 PM  [facility] [address] [place], [place], [address] Phone: [phone]   Fax:  [phone]  Name: Robert [name] MRN: [id number] Date of Birth: January 12, 1946

## 2017-03-10 ENCOUNTER — Encounter: Payer: Medicare Other | Admitting: Physical Therapy

## 2017-03-15 ENCOUNTER — Encounter: Payer: Medicare Other | Admitting: Physical Therapy

## 2017-03-17 ENCOUNTER — Encounter: Payer: Self-pay | Admitting: Physical Therapy

## 2017-03-17 ENCOUNTER — Ambulatory Visit: Payer: Medicare Other | Admitting: Physical Therapy

## 2017-03-17 DIAGNOSIS — R269 Unspecified abnormalities of gait and mobility: Secondary | ICD-10-CM

## 2017-03-17 DIAGNOSIS — R29898 Other symptoms and signs involving the musculoskeletal system: Secondary | ICD-10-CM | POA: Diagnosis not present

## 2017-03-17 DIAGNOSIS — M6281 Muscle weakness (generalized): Secondary | ICD-10-CM

## 2017-03-17 DIAGNOSIS — E119 Type 2 diabetes mellitus without complications: Secondary | ICD-10-CM | POA: Diagnosis not present

## 2017-03-17 DIAGNOSIS — R7303 Prediabetes: Secondary | ICD-10-CM | POA: Diagnosis not present

## 2017-03-17 DIAGNOSIS — R251 Tremor, unspecified: Secondary | ICD-10-CM | POA: Diagnosis not present

## 2017-03-17 DIAGNOSIS — R2689 Other abnormalities of gait and mobility: Secondary | ICD-10-CM

## 2017-03-17 DIAGNOSIS — I10 Essential (primary) hypertension: Secondary | ICD-10-CM | POA: Diagnosis not present

## 2017-03-17 DIAGNOSIS — G8190 Hemiplegia, unspecified affecting unspecified side: Secondary | ICD-10-CM | POA: Diagnosis not present

## 2017-03-17 DIAGNOSIS — R531 Weakness: Secondary | ICD-10-CM | POA: Diagnosis not present

## 2017-03-17 DIAGNOSIS — F411 Generalized anxiety disorder: Secondary | ICD-10-CM | POA: Diagnosis not present

## 2017-03-17 NOTE — Therapy (Signed)
[facility] [address] [place], [place], [address] Phone: [phone]   Fax:  [phone]  Physical Therapy Treatment  Patient Details  Name: Robert [name] MRN: [id number] Date of Birth: 06-10-1946 Referring Provider: Farris Has, MD  Encounter Date: 03/17/2017      PT End of Session - 03/17/17 1445    Visit Number 15   Number of Visits 19   Date for PT Re-Evaluation 04/05/17   PT Start Time 1436   PT Stop Time 1530   PT Time Calculation (min) 54 min   Activity Tolerance Patient tolerated treatment well   Behavior During Therapy [facility] for tasks assessed/performed      Past Medical History:  Diagnosis Date  . Frequency of urination   . GERD (gastroesophageal reflux disease)   . Gross hematuria   . History of acute pyelonephritis    10-13-2012  . History of CVA with residual deficit    2008--  left side of body weakness and foot drop (wears leg brace and uses cane)  . History of DVT of lower extremity    2008--  cva  . Hyperlipidemia   . Hypertension   . 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 Urethral stricture   . Urgency of urination   . Weakness of left side of body    secondary to cva 2008  . Wears glasses   . Wears hearing aid    bilateral-- wears intermittantly    Past Surgical History:  Procedure Laterality Date  . CYSTOSCOPY WITH RETROGRADE URETHROGRAM N/A 10/23/2015   Procedure: CYSTOSCOPY WITH RETROGRADE URETHROGRAM;  Surgeon: Barron Alvine, MD;  Location[facility];  Service: Urology;  Laterality: N/A;  . CYSTOSCOPY WITH URETHRAL DILATATION N/A 10/23/2015   Procedure: CYSTOSCOPY WITH URETHRAL BALLOON DILATATION;  Surgeon: Barron Alvine, MD;  Location: [facility];  Service: Urology;  Laterality: N/A;  BALLOON DILATION   . IVC FILTER PLACEMENT ([facility] HX)  2008    There were no vitals filed  for this visit.      Subjective Assessment - 03/17/17 1447    Subjective Pt reports doing his exercises daily including riding the bike at his gym and doing his leg exercises.    Currently in Pain? No/denies   Multiple Pain Sites No                         [facility] Adult PT Treatment/Exercise - 03/17/17 0001      Ambulation/Gait   Ambulation/Gait Yes   Ambulation/Gait Assistance 6: Modified independent (Device/Increase time)  240 feet   Assistive device Straight cane   Gait Pattern Step-through pattern;Decreased arm swing - left;Decreased step length - left;Decreased stride length;Decreased dorsiflexion - left;Decreased weight shift to left   Ambulation Surface Level   Gait velocity Slow   Gait Comments Difficulty picking up Lt foot     Knee/Hip Exercises: Aerobic   Nustep L2 12 min  PTA monitored     Knee/Hip Exercises: Seated   Long Arc Quad Strengthening;Both;2 sets;10 reps  LT 1#, RT 4#   Long Arc Quad Weight --  used slider under LT for 10x   [employer] --  2x 25   Clamshell with TheraBand Blue  30x  PT Short Term Goals - 03/17/17 1448      PT SHORT TERM GOAL #2   Title go down steps with moderate to minimal assistance to guide his left leg due to increased strength   Time 4   Period Weeks   Status On-going           PT Long Term Goals - 03/03/17 1452      PT LONG TERM GOAL #1   Title be independent with HEP with assistance from his wife   Time 8   Period Weeks   Status On-going     PT LONG TERM GOAL #2   Title reduce FOTO to < or = to 40% limitation   Time 8   Period Weeks   Status On-going               Plan - 03/17/17 1446    Clinical Impression Statement Continues to need max guidance coming down stairs. Pt has missed 2 visits due to not going out in the rainly weather for safety.  Despite having difficulty lifting the left foot when walking he performed better than he did  at his last  session.  He reports at home he walks and/or  rides a stationary bike everyday  for exercise.    Rehab Potential Good   PT Frequency 2x / week   PT Duration 8 weeks   PT Treatment/Interventions ADLs/Self Care Home Management;Cryotherapy;Electrical Stimulation;Functional mobility training;Stair training;Gait training;Ultrasound;Moist Heat;Therapeutic activities;Therapeutic exercise;Balance training;Neuromuscular re-education;Patient/family education;Passive range of motion;Manual techniques;Taping   PT Next Visit Plan Endurance, standing strength   Consulted and Agree with Plan of Care Patient      Patient will benefit from skilled therapeutic intervention in order to improve the following deficits and impairments:  Abnormal gait, Decreased range of motion, Difficulty walking, Pain, Decreased strength, Decreased balance, Impaired flexibility, Decreased activity tolerance, Decreased endurance, Impaired UE functional use  Visit Diagnosis: Muscle weakness (generalized)  Other abnormalities of gait and mobility  Weakness  Abnormality of gait  Weakness of left lower extremity     Problem List Patient Active Problem List   Diagnosis Date Noted  . Acute pyelonephritis 09/29/2012  . Nausea 09/29/2012  . Fever 09/29/2012  . HTN (hypertension) 09/29/2012  . H/O: CVA (cerebrovascular accident) 09/29/2012  . Hearing loss 09/29/2012    Maddyson Keil, PTA 03/17/2017, 3:37 PM  [facility] [address] [place], [place], [address] Phone: [phone]   Fax:  [phone]  Name: Robert [name] MRN: [id number] Date of Birth: Mar 08, 1946

## 2017-03-24 ENCOUNTER — Encounter: Payer: Medicare Other | Admitting: Physical Therapy

## 2017-03-29 ENCOUNTER — Ambulatory Visit: Payer: Medicare Other | Attending: Family Medicine

## 2017-03-29 DIAGNOSIS — M6281 Muscle weakness (generalized): Secondary | ICD-10-CM | POA: Insufficient documentation

## 2017-03-29 DIAGNOSIS — R29898 Other symptoms and signs involving the musculoskeletal system: Secondary | ICD-10-CM | POA: Diagnosis not present

## 2017-03-29 DIAGNOSIS — R2689 Other abnormalities of gait and mobility: Secondary | ICD-10-CM | POA: Diagnosis not present

## 2017-03-29 DIAGNOSIS — R269 Unspecified abnormalities of gait and mobility: Secondary | ICD-10-CM | POA: Insufficient documentation

## 2017-03-29 DIAGNOSIS — R531 Weakness: Secondary | ICD-10-CM | POA: Diagnosis not present

## 2017-03-29 NOTE — Therapy (Signed)
[facility] [address] [place], [place], [address] Phone: [phone]   Fax:  [phone]  Physical Therapy Treatment  Patient Details  Name: Marland Reine MRN: [id number] Date of Birth: 22-Apr-1946 Referring Provider: [name], MD  Encounter Date: 03/29/2017      PT End of Session - 03/29/17 1528    Visit Number 16   Number of Visits 19   Date for PT Re-Evaluation 04/05/17   PT Start Time 7035   PT Stop Time 1527   PT Time Calculation (min) 43 min   Activity Tolerance Patient tolerated treatment well   Behavior During Therapy [facility] for tasks assessed/performed      Past Medical History:  Diagnosis Date  . Frequency of urination   . GERD (gastroesophageal reflux disease)   . Gross hematuria   . History of acute pyelonephritis    10-13-2012  . History of CVA with residual deficit    2008--  left side of body weakness and foot drop (wears leg brace and uses cane)  . History of DVT of lower extremity    2008--  cva  . Hyperlipidemia   . Hypertension   . 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 Urethral stricture   . Urgency of urination   . Weakness of left side of body    secondary to cva 2008  . Wears glasses   . Wears hearing aid    bilateral-- wears intermittantly    Past Surgical History:  Procedure Laterality Date  . CYSTOSCOPY WITH RETROGRADE URETHROGRAM N/A 10/23/2015   Procedure: CYSTOSCOPY WITH RETROGRADE URETHROGRAM;  Surgeon: [name], MD;  Location: [facility];  Service: Urology;  Laterality: N/A;  . CYSTOSCOPY WITH URETHRAL DILATATION N/A 10/23/2015   Procedure: CYSTOSCOPY WITH URETHRAL BALLOON DILATATION;  Surgeon: [name], MD;  Location: [facility];  Service: Urology;  Laterality: N/A;  BALLOON DILATION   . IVC FILTER PLACEMENT ([place] HX)  2008    There were no vitals filed  for this visit.      Subjective Assessment - 03/29/17 1452    Subjective Pt's wife reported that he is taking new medicine for anxiety and she requested no weights to be added to his treatment today.     Pertinent History CVA 2008   Patient Stated Goals get stronger, walk with wife in park, descend steps without assistance for Lt LE   Currently in Pain? No/denies                         [facility] Adult PT Treatment/Exercise - 03/29/17 0001      Knee/Hip Exercises: Aerobic   Nustep L2 12 min  PTA monitored     Knee/Hip Exercises: Seated   Long Arc Quad Strengthening;Both;2 sets;10 reps  no weight   Long Arc Quad Weight --  no weight per pt request   Ball Squeeze --  2x 25   Clamshell with TheraBand Blue  30x   Marching Limitations 2x10                  PT Short Term Goals - 03/29/17 1458      PT SHORT TERM GOAL #2   Title go down steps with moderate to minimal assistance to guide his left leg due to increased strength   Time 4  Period Weeks   Status On-going           PT Long Term Goals - 03/29/17 1458      PT LONG TERM GOAL #1   Title be independent with HEP with assistance from his wife   Time 8   Period Weeks   Status On-going     PT LONG TERM GOAL #3   Title walk in park for 30 min with 2 rests using the cane   Baseline 10-15 minutes with 2 rests   Status On-going     PT LONG TERM GOAL #4   Title descend steps with minimal to no assistance with Lt LE due to increased strength   Time 8   Period Weeks   Status On-going     PT LONG TERM GOAL #5   Title perform sit to stand with supervision due to increased LE strength   Baseline supervision to minimal assistance   Time 8   Period Weeks   Status On-going               Plan - 03/29/17 1506    Clinical Impression Statement Pt didn't use weights in the clinic today as his wife requested due to adjusting to a new medication.  Pt tolerated all exercise in the clinic.  Seated exercise only today due to being dizzy in standing.   Pt with significant Lt LE weakness due to effect of CVA.  Pt with minimal change in mobility and continues to have endurance and strength deficits with walking for exercise and descending steps.  Pt requires minimal assistance with sit to stand and increased time with this activity.  Pt will benefit from continued PT for LE strength, endurance and balance.     Rehab Potential Good   PT Frequency 2x / week   PT Duration 8 weeks   PT Treatment/Interventions ADLs/Self Care Home Management;Cryotherapy;Electrical Stimulation;Functional mobility training;Stair training;Gait training;Ultrasound;Moist Heat;Therapeutic activities;Therapeutic exercise;Balance training;Neuromuscular re-education;Patient/family education;Passive range of motion;Manual techniques;Taping   PT Next Visit Plan Endurance, standing strength      Patient will benefit from skilled therapeutic intervention in order to improve the following deficits and impairments:  Abnormal gait, Decreased range of motion, Difficulty walking, Pain, Decreased strength, Decreased balance, Impaired flexibility, Decreased activity tolerance, Decreased endurance, Impaired UE functional use  Visit Diagnosis: Muscle weakness (generalized)  Other abnormalities of gait and mobility     Problem List Patient Active Problem List   Diagnosis Date Noted  . Acute pyelonephritis 09/29/2012  . Nausea 09/29/2012  . Fever 09/29/2012  . HTN (hypertension) 09/29/2012  . H/O: CVA (cerebrovascular accident) 09/29/2012  . Hearing loss 09/29/2012    Sigurd Sos, PT 03/29/17 3:33 PM  [facility] [address] [place], [place], [address] Phone: [phone]   Fax:  [phone]  Name: Fotios Amos MRN: [id number] Date of Birth: 05/02/1946

## 2017-03-31 ENCOUNTER — Encounter: Payer: Self-pay | Admitting: Physical Therapy

## 2017-03-31 ENCOUNTER — Ambulatory Visit: Payer: Medicare Other | Admitting: Physical Therapy

## 2017-03-31 DIAGNOSIS — R2689 Other abnormalities of gait and mobility: Secondary | ICD-10-CM | POA: Diagnosis not present

## 2017-03-31 DIAGNOSIS — R29898 Other symptoms and signs involving the musculoskeletal system: Secondary | ICD-10-CM

## 2017-03-31 DIAGNOSIS — M6281 Muscle weakness (generalized): Secondary | ICD-10-CM

## 2017-03-31 DIAGNOSIS — R531 Weakness: Secondary | ICD-10-CM

## 2017-03-31 DIAGNOSIS — R269 Unspecified abnormalities of gait and mobility: Secondary | ICD-10-CM

## 2017-03-31 NOTE — Therapy (Signed)
[facility] [address] [place], [place], [address] Phone: [phone]   Fax:  [phone]  Physical Therapy Treatment  Patient Details  Name: Robert [name] MRN: [id number] Date of Birth: Mar 01, 1946 Referring Provider: Farris Has, MD  Encounter Date: 03/31/2017      PT End of Session - 03/31/17 1442    Visit Number 17   Number of Visits 19   Date for PT Re-Evaluation 04/05/17   PT Start Time 1442   PT Stop Time 1525   PT Time Calculation (min) 43 min   Activity Tolerance Patient tolerated treatment well   Behavior During Therapy [facility] for tasks assessed/performed      Past Medical History:  Diagnosis Date  . Frequency of urination   . GERD (gastroesophageal reflux disease)   . Gross hematuria   . History of acute pyelonephritis    10-13-2012  . History of CVA with residual deficit    2008--  left side of body weakness and foot drop (wears leg brace and uses cane)  . History of DVT of lower extremity    2008--  cva  . Hyperlipidemia   . Hypertension   . 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 Urethral stricture   . Urgency of urination   . Weakness of left side of body    secondary to cva 2008  . Wears glasses   . Wears hearing aid    bilateral-- wears intermittantly    Past Surgical History:  Procedure Laterality Date  . CYSTOSCOPY WITH RETROGRADE URETHROGRAM N/A 10/23/2015   Procedure: CYSTOSCOPY WITH RETROGRADE URETHROGRAM;  Surgeon: Barron Alvine, MD;  Location: [facility];  Service: Urology;  Laterality: N/A;  . CYSTOSCOPY WITH URETHRAL DILATATION N/A 10/23/2015   Procedure: CYSTOSCOPY WITH URETHRAL BALLOON DILATATION;  Surgeon: Barron Alvine, MD;  Location: [facility];  Service: Urology;  Laterality: N/A;  BALLOON DILATION   . IVC FILTER PLACEMENT ([facility] HX)  2008    There were no vitals filed  for this visit.      Subjective Assessment - 03/31/17 1447    Subjective New anxiety meds are being tolerated well per pt wife. She thinks it is helping. Maybe stay with no weights 1x more.    Patient Stated Goals get stronger, walk with wife in park, descend steps without assistance for Lt LE   Currently in Pain? No/denies   Multiple Pain Sites No                         [facility] Adult PT Treatment/Exercise - 03/31/17 0001      Knee/Hip Exercises: Aerobic   Nustep L2 x 12 min  PTA present to monitor     Knee/Hip Exercises: Seated   Long Arc Quad Strengthening;Right;2 sets;10 reps  no weight   Long Arc Quad Weight --  no weight per pt request, only 10x total on the LT   [employer] --  30x consecutive   Clamshell with TheraBand Blue  30x   Marching Limitations 2x15                  PT Short Term Goals - 03/29/17 1458      PT SHORT TERM GOAL #2   Title go down steps with moderate to minimal assistance to guide his left leg due  to increased strength   Time 4   Period Weeks   Status On-going           PT Long Term Goals - 03/29/17 1458      PT LONG TERM GOAL #1   Title be independent with HEP with assistance from his wife   Time 8   Period Weeks   Status On-going     PT LONG TERM GOAL #3   Title walk in park for 30 min with 2 rests using the cane   Baseline 10-15 minutes with 2 rests   Status On-going     PT LONG TERM GOAL #4   Title descend steps with minimal to no assistance with Lt LE due to increased strength   Time 8   Period Weeks   Status On-going     PT LONG TERM GOAL #5   Title perform sit to stand with supervision due to increased LE strength   Baseline supervision to minimal assistance   Time 8   Period Weeks   Status On-going               Plan - 03/31/17 1500    Clinical Impression Statement One more session where pt did not use weights, in fact gravity was challenging enough. Pt's overall mobility  remains about the same as last session.    Rehab Potential Good   PT Frequency 2x / week   PT Duration 8 weeks   PT Treatment/Interventions ADLs/Self Care Home Management;Cryotherapy;Electrical Stimulation;Functional mobility training;Stair training;Gait training;Ultrasound;Moist Heat;Therapeutic activities;Therapeutic exercise;Balance training;Neuromuscular re-education;Patient/family education;Passive range of motion;Manual techniques;Taping   PT Next Visit Plan Endurance, standing strength   Consulted and Agree with Plan of Care Patient      Patient will benefit from skilled therapeutic intervention in order to improve the following deficits and impairments:  Abnormal gait, Decreased range of motion, Difficulty walking, Pain, Decreased strength, Decreased balance, Impaired flexibility, Decreased activity tolerance, Decreased endurance, Impaired UE functional use  Visit Diagnosis: Muscle weakness (generalized)  Other abnormalities of gait and mobility  Weakness  Abnormality of gait  Weakness of left lower extremity     Problem List Patient Active Problem List   Diagnosis Date Noted  . Acute pyelonephritis 09/29/2012  . Nausea 09/29/2012  . Fever 09/29/2012  . HTN (hypertension) 09/29/2012  . H/O: CVA (cerebrovascular accident) 09/29/2012  . Hearing loss 09/29/2012    Esabella Stockinger, PTA 03/31/2017, 3:10 PM  [facility] [address] [place], [place], [address] Phone: [phone]   Fax:  [phone]  Name: Zaedin Basha MRN: [id number] Date of Birth: [birth year]

## 2017-04-05 ENCOUNTER — Ambulatory Visit: Payer: Medicare Other

## 2017-04-05 DIAGNOSIS — M6281 Muscle weakness (generalized): Secondary | ICD-10-CM | POA: Diagnosis not present

## 2017-04-05 DIAGNOSIS — R2689 Other abnormalities of gait and mobility: Secondary | ICD-10-CM

## 2017-04-05 DIAGNOSIS — R269 Unspecified abnormalities of gait and mobility: Secondary | ICD-10-CM | POA: Diagnosis not present

## 2017-04-05 DIAGNOSIS — R29898 Other symptoms and signs involving the musculoskeletal system: Secondary | ICD-10-CM | POA: Diagnosis not present

## 2017-04-05 DIAGNOSIS — R531 Weakness: Secondary | ICD-10-CM | POA: Diagnosis not present

## 2017-04-05 NOTE — Therapy (Signed)
[facility] [address], [place], [place], [address] Phone: [phone]   Fax:  [phone]  Physical Therapy Treatment  Patient Details  Name: Robert [name] MRN: [id number] Date of Birth: 1946/02/22 Referring Provider: [name], MD  Encounter Date: 04/05/2017      PT End of Session - 04/05/17 1521    Visit Number [id number]   PT Start Time 1446   PT Stop Time 5631  left early due to rain coming   PT Time Calculation (min) 35 min   Activity Tolerance Patient tolerated treatment well   Behavior During Therapy [facility] for tasks assessed/performed      Past Medical History:  Diagnosis Date  . Frequency of urination   . GERD (gastroesophageal reflux disease)   . Gross hematuria   . History of acute pyelonephritis    10-13-2012  . History of CVA with residual deficit    2008--  left side of body weakness and foot drop (wears leg brace and uses cane)  . History of DVT of lower extremity    2008--  cva  . Hyperlipidemia   . Hypertension   . 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 Urethral stricture   . Urgency of urination   . Weakness of left side of body    secondary to cva 2008  . Wears glasses   . Wears hearing aid    bilateral-- wears intermittantly    Past Surgical History:  Procedure Laterality Date  . CYSTOSCOPY WITH RETROGRADE URETHROGRAM N/A 10/23/2015   Procedure: CYSTOSCOPY WITH RETROGRADE URETHROGRAM;  Surgeon: [name], MD;  Location: [facility];  Service: Urology;  Laterality: N/A;  . CYSTOSCOPY WITH URETHRAL DILATATION N/A 10/23/2015   Procedure: CYSTOSCOPY WITH URETHRAL BALLOON DILATATION;  Surgeon: [name], MD;  Location: [facility];  Service: Urology;  Laterality: N/A;  BALLOON DILATION   . IVC FILTER PLACEMENT ([place] HX)  2008    There were no vitals filed for this visit.           [facility] PT Assessment - 04/05/17 0001      Assessment   Medical Diagnosis hemiparesis     Observation/Other Assessments   Focus on Therapeutic Outcomes (FOTO)  54% limitation     Transfers   Transfers Sit to Stand;Stand to Sit   Sit to Stand From elevated surface;With upper extremity assist;4: Min guard;4: Min assist     Ambulation/Gait   Ambulation/Gait Yes   Ambulation/Gait Assistance 6: Modified independent (Device/Increase time)  240 feet   Assistive device Straight cane   Gait Pattern Step-through pattern;Decreased arm swing - left;Decreased step length - left;Decreased stride length;Decreased dorsiflexion - left;Decreased weight shift to left   Ambulation Surface Level   Gait velocity Slow   Gait Comments Difficulty picking up Lt foot     Timed Up and Go Test   TUG Normal TUG   Normal TUG (seconds) 41  diffuculty getting up                     [facility] Adult PT Treatment/Exercise - 04/05/17 0001      Knee/Hip Exercises: Aerobic   Nustep L2 x 12 min  PTA present to monitor     Knee/Hip Exercises: Seated   Long Arc Quad Strengthening;Right;2 sets;10 reps  3#   [facility] --  30x  consecutive   Clamshell with TheraBand Blue  30x   Marching Limitations 2x15                  PT Short Term Goals - 03/29/17 1458      PT SHORT TERM GOAL #2   Title go down steps with moderate to minimal assistance to guide his left leg due to increased strength   Time 4   Period Weeks   Status On-going           PT Long Term Goals - Apr 07, 2017 1453      PT LONG TERM GOAL #1   Title be independent with HEP with assistance from his wife   Status Achieved     PT LONG TERM GOAL #2   Title reduce FOTO to < or = to 40% limitation   Baseline 54% limitation   Status Partially Met     PT LONG TERM GOAL #3   Title walk in park for 30 min with 2 rests using the cane   Baseline 10-15 minutes with 2 rests   Status Partially Met     PT LONG TERM GOAL #4    Title descend steps with minimal to no assistance with Lt LE due to increased strength   Baseline mod/max assistance from wife   Status Not Met     PT LONG TERM GOAL #5   Title perform sit to stand with supervision due to increased LE strength   Baseline supervision to minimal assistance   Status Partially Met     PT LONG TERM GOAL #6   Title perform TUG in < or = to 35 seconds   Status Not Met               Plan - 2017/04/07 1512    Clinical Impression Statement Pt will be discharged to HEP today.  Pt without significant change in Lt LE function over the course of treatement.  TUG was 45 seconds today with difficulty with sit to stand.  Pt continues to require assistance from wife to perform sit to stand, ascend and descend steps.  Pt will D/C to HEP.     PT Next Visit Plan D/C PT to HEP   Consulted and Agree with Plan of Care Patient      Patient will benefit from skilled therapeutic intervention in order to improve the following deficits and impairments:     Visit Diagnosis: Muscle weakness (generalized)  Other abnormalities of gait and mobility       G-Codes - 04-07-2017 1514    Functional Assessment Tool Used (Outpatient Only) TUG and FOTO   Functional Limitation Mobility: Walking and moving around   Mobility: Walking and Moving Around Goal Status [phone]) At least 40 percent but less than 60 percent impaired, limited or restricted   Mobility: Walking and Moving Around Discharge Status [phone]) At least 40 percent but less than 60 percent impaired, limited or restricted      Problem List Patient Active Problem List   Diagnosis Date Noted  . Acute pyelonephritis 09/29/2012  . Nausea 09/29/2012  . Fever 09/29/2012  . HTN (hypertension) 09/29/2012  . H/O: CVA (cerebrovascular accident) 09/29/2012  . Hearing loss 09/29/2012   PHYSICAL THERAPY DISCHARGE SUMMARY  Visits from Start of Care: 18  Current functional level related to goals / functional outcomes: See  above for current status.     Remaining deficits: Lt LE weakness and limited mobility due to CVA   Education /  Equipment: HEP Plan: Patient agrees to discharge.  Patient goals were partially met. Patient is being discharged due to being pleased with the current functional level.  ?????          Sigurd Sos, PT 04/05/17 3:27 PM    [facility] [address] [place], [place], [address] Phone: [phone]   Fax:  [phone]  Name: Robert [name] MRN: [id number] Date of Birth: 08-11-1946

## 2017-04-07 ENCOUNTER — Encounter: Payer: Medicare Other | Admitting: Physical Therapy

## 2017-04-22 ENCOUNTER — Observation Stay (HOSPITAL_COMMUNITY)
Admission: EM | Admit: 2017-04-22 | Discharge: 2017-04-23 | Disposition: A | Discharge status: Home / Self Care | Payer: Medicare Other | Attending: Internal Medicine | Admitting: Internal Medicine

## 2017-04-22 ENCOUNTER — Encounter (HOSPITAL_COMMUNITY): Payer: Self-pay | Admitting: Emergency Medicine

## 2017-04-22 ENCOUNTER — Emergency Department (HOSPITAL_COMMUNITY): Payer: Medicare Other

## 2017-04-22 ENCOUNTER — Observation Stay (HOSPITAL_COMMUNITY): Payer: Medicare Other

## 2017-04-22 DIAGNOSIS — N390 Urinary tract infection, site not specified: Secondary | ICD-10-CM | POA: Diagnosis present

## 2017-04-22 DIAGNOSIS — R251 Tremor, unspecified: Principal | ICD-10-CM | POA: Diagnosis present

## 2017-04-22 DIAGNOSIS — I10 Essential (primary) hypertension: Secondary | ICD-10-CM | POA: Diagnosis present

## 2017-04-22 DIAGNOSIS — D487 Neoplasm of uncertain behavior of other specified sites: Secondary | ICD-10-CM | POA: Diagnosis not present

## 2017-04-22 DIAGNOSIS — I159 Secondary hypertension, unspecified: Secondary | ICD-10-CM | POA: Diagnosis not present

## 2017-04-22 DIAGNOSIS — R791 Abnormal coagulation profile: Secondary | ICD-10-CM | POA: Diagnosis not present

## 2017-04-22 DIAGNOSIS — Z7982 Long term (current) use of aspirin: Secondary | ICD-10-CM | POA: Insufficient documentation

## 2017-04-22 DIAGNOSIS — Z87891 Personal history of nicotine dependence: Secondary | ICD-10-CM | POA: Insufficient documentation

## 2017-04-22 DIAGNOSIS — Z8673 Personal history of transient ischemic attack (TIA), and cerebral infarction without residual deficits: Secondary | ICD-10-CM

## 2017-04-22 DIAGNOSIS — I6789 Other cerebrovascular disease: Secondary | ICD-10-CM | POA: Diagnosis not present

## 2017-04-22 DIAGNOSIS — Z79899 Other long term (current) drug therapy: Secondary | ICD-10-CM | POA: Diagnosis not present

## 2017-04-22 LAB — URINALYSIS, ROUTINE W REFLEX MICROSCOPIC
Bilirubin Urine: NEGATIVE
Glucose, UA: 50 mg/dL — AB
Ketones, ur: NEGATIVE mg/dL
Nitrite: POSITIVE — AB
Protein, ur: NEGATIVE mg/dL
Specific Gravity, Urine: 1.017 (ref 1.005–1.030)
pH: 5 (ref 5.0–8.0)

## 2017-04-22 LAB — CBC
HCT: 45.7 % (ref 39.0–52.0)
HCT: 45.4 % (ref 39.0–52.0)
Hemoglobin: 15.1 g/dL (ref 13.0–17.0)
Hemoglobin: 15.3 g/dL (ref 13.0–17.0)
MCH: 29.2 pg (ref 26.0–34.0)
MCH: 29.8 pg (ref 26.0–34.0)
MCHC: 33 g/dL (ref 30.0–36.0)
MCHC: 33.7 g/dL (ref 30.0–36.0)
MCV: 88.2 fL (ref 78.0–100.0)
MCV: 88.5 fL (ref 78.0–100.0)
Platelets: 262 10*3/uL (ref 150–400)
Platelets: 279 10*3/uL (ref 150–400)
RBC: 5.18 MIL/uL (ref 4.22–5.81)
RBC: 5.13 MIL/uL (ref 4.22–5.81)
RDW: 13.3 % (ref 11.5–15.5)
RDW: 13.2 % (ref 11.5–15.5)
WBC: 6.4 10*3/uL (ref 4.0–10.5)
WBC: 7.7 10*3/uL (ref 4.0–10.5)

## 2017-04-22 LAB — PROTIME-INR
INR: 1.1
Prothrombin Time: 14.2 seconds (ref 11.4–15.2)

## 2017-04-22 LAB — CREATININE, SERUM
Creatinine, Ser: 1.05 mg/dL (ref 0.61–1.24)
GFR calc Af Amer: >60 mL/min (ref >60)
GFR calc non Af Amer: >60 mL/min (ref >60)

## 2017-04-22 LAB — COMPREHENSIVE METABOLIC PANEL
ALT: 20 U/L (ref 17–63)
AST: 20 U/L (ref 15–41)
Albumin: 3.5 g/dL (ref 3.5–5.0)
Alkaline Phosphatase: 71 U/L (ref 38–126)
Anion gap: 8 (ref 5–15)
BUN: 10 mg/dL (ref 6–20)
CO2: 25 mmol/L (ref 22–32)
Calcium: 9.1 mg/dL (ref 8.9–10.3)
Chloride: 104 mmol/L (ref 101–111)
Creatinine, Ser: 0.97 mg/dL (ref 0.61–1.24)
GFR calc Af Amer: >60 mL/min (ref >60)
GFR calc non Af Amer: >60 mL/min (ref >60)
Glucose, Bld: 140 mg/dL — ABNORMAL HIGH (ref 65–99)
Potassium: 3.7 mmol/L (ref 3.5–5.1)
Sodium: 137 mmol/L (ref 135–145)
Total Bilirubin: 1 mg/dL (ref 0.3–1.2)
Total Protein: 6.6 g/dL (ref 6.5–8.1)

## 2017-04-22 LAB — TSH: TSH: 2.053 u[IU]/mL (ref 0.350–4.500)

## 2017-04-22 LAB — APTT: aPTT: 23 seconds — ABNORMAL LOW (ref 24–36)

## 2017-04-22 LAB — FOLATE: Folate: 17.3 ng/mL (ref >5.9)

## 2017-04-22 LAB — VITAMIN B12: Vitamin B-12: 844 pg/mL (ref 180–914)

## 2017-04-22 MED ORDER — ONE-DAILY MULTI VITAMINS PO TABS: 1.0000 | ORAL_TABLET | Freq: Every day | ORAL | Status: DC

## 2017-04-22 MED ORDER — DEXTROSE 5 % IV SOLN
1.0000 g | Freq: Once | INTRAVENOUS | Status: AC
Start: 2017-04-22 — End: 2017-04-22
  Administered 2017-04-22: 1 g via INTRAVENOUS
  Filled 2017-04-22: qty 10

## 2017-04-22 MED ORDER — ACETAMINOPHEN 325 MG PO TABS: 650.0000 mg | ORAL_TABLET | Freq: Four times a day (QID) | ORAL | Status: DC | PRN

## 2017-04-22 MED ORDER — SIMVASTATIN 20 MG PO TABS
20.0000 mg | ORAL_TABLET | Freq: Every evening | ORAL | Status: DC
Start: 2017-04-22 — End: 2017-04-23
  Administered 2017-04-22: 20 mg via ORAL
  Filled 2017-04-22: qty 1

## 2017-04-22 MED ORDER — ONDANSETRON HCL 4 MG PO TABS: 4.0000 mg | ORAL_TABLET | Freq: Four times a day (QID) | ORAL | Status: DC | PRN

## 2017-04-22 MED ORDER — ASPIRIN EC 81 MG PO TBEC
81.0000 mg | DELAYED_RELEASE_TABLET | Freq: Every day | ORAL | Status: DC
  Administered 2017-04-23: 81 mg via ORAL
  Filled 2017-04-22: qty 1

## 2017-04-22 MED ORDER — ASPIRIN 81 MG PO TABS: 81.0000 mg | ORAL_TABLET | Freq: Every day | ORAL | Status: DC

## 2017-04-22 MED ORDER — SODIUM CHLORIDE 0.9% FLUSH
3.0000 mL | Freq: Two times a day (BID) | INTRAVENOUS | Status: DC
  Administered 2017-04-22: 3 mL via INTRAVENOUS

## 2017-04-22 MED ORDER — ONDANSETRON HCL 4 MG/2ML IJ SOLN: 4.0000 mg | Freq: Four times a day (QID) | INTRAMUSCULAR | Status: DC | PRN

## 2017-04-22 MED ORDER — SODIUM CHLORIDE 0.9 % IV SOLN
INTRAVENOUS | Status: DC
  Administered 2017-04-23: 01:00:00 via INTRAVENOUS

## 2017-04-22 MED ORDER — SERTRALINE HCL 50 MG PO TABS
25.0000 mg | ORAL_TABLET | Freq: Every day | ORAL | Status: DC
  Filled 2017-04-22: qty 1

## 2017-04-22 MED ORDER — ADULT MULTIVITAMIN W/MINERALS CH
1.0000 | ORAL_TABLET | Freq: Every day | ORAL | Status: DC
  Administered 2017-04-23: 1 via ORAL
  Filled 2017-04-22: qty 1

## 2017-04-22 MED ORDER — ACETAMINOPHEN 650 MG RE SUPP: 650.0000 mg | Freq: Four times a day (QID) | RECTAL | Status: DC | PRN

## 2017-04-22 MED ORDER — ENOXAPARIN SODIUM 40 MG/0.4ML ~~LOC~~ SOLN
40.0000 mg | SUBCUTANEOUS | Status: DC
  Administered 2017-04-22: 40 mg via SUBCUTANEOUS
  Filled 2017-04-22: qty 0.4

## 2017-04-22 MED ORDER — DEXTROSE 5 % IV SOLN
1.0000 g | INTRAVENOUS | Status: DC
  Administered 2017-04-23: 1 g via INTRAVENOUS
  Filled 2017-04-22: qty 10

## 2017-04-22 MED ORDER — LOSARTAN POTASSIUM 50 MG PO TABS
50.0000 mg | ORAL_TABLET | Freq: Every morning | ORAL | Status: DC
  Administered 2017-04-23: 50 mg via ORAL
  Filled 2017-04-22: qty 1

## 2017-04-22 NOTE — Progress Notes (Signed)
Pharmacy Antibiotic Note  Robert [name] is a 71 y.o. male admitted on 04/22/2017 with UTI.  Pharmacy has been consulted for Ceftriaxone dosing. Received 1st dose today at 1000 AM.   Plan: Ceftriaxone 1g IV every 24 hours.   Pharmacy will sign off consult as no dose adjustment required.   Height: 6\' 2"  (188 cm) Weight: 232 lb (105.2 kg) IBW/kg (Calculated) : 82.2  Temp (24hrs), Avg:98.5 F (36.9 C), Min:98.3 F (36.8 C), Max:98.6 F (37 C)   Recent Labs Lab 04/22/17 0930  WBC 6.4  CREATININE 0.97    Estimated Creatinine Clearance: 92.9 mL/min (by C-G formula based on SCr of 0.97 mg/dL).    Allergies  Allergen Reactions  . Propofol Other (See Comments)    "hiccups for weeks"    Antimicrobials this admission: 6/28 Ceftriaxone >>  Dose adjustments this admission:  Microbiology results: 6/28 UCx:   Thank you for allowing pharmacy to be a part of this patient's care.  Sloan Leiter, PharmD, BCPS Clinical Pharmacist Clinical phone 04/22/2017 until 11PM [phone] After hours, please call #[phone] 04/22/2017 6:54 PM

## 2017-04-22 NOTE — ED Provider Notes (Signed)
[facility] DEPT Provider Note   CSN: [id number] Arrival date & time: 04/22/17  9381     History   Chief Complaint Chief Complaint  Patient presents with  . Tremors    HPI Robert [name] is a 71 y.o. male.  HPI  Pt presenting with c/o right arm and leg tremor which has been worsening over the past 2 months.  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  Wife states that his tremor has been worsening gradually over 2 months and then this morning when he woke up the tremor was much worse.  She states he cannot hold a cup to drink or hold anything.  He is not able to ambulate- he already had significant difficulty given chronic left sided weakness.  No fever/chillls.  No vomiting.  There are no other associated systemic symptoms, there are no other alleviating or modifying factors.   Past Medical History:  Diagnosis Date  . Frequency of urination   . GERD (gastroesophageal reflux disease)   . Gross hematuria   . History of acute pyelonephritis    10-13-2012  . History of CVA with residual deficit    2008--  left side of body weakness and foot drop (wears leg brace and uses cane)  . History of DVT of lower extremity    2008--  cva  . Hyperlipidemia   . Hypertension   . 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 Urethral stricture   . Urgency of urination   . Weakness of left side of body    secondary to cva 2008  . Wears glasses   . Wears hearing aid    bilateral-- wears intermittantly    Patient Active Problem List   Diagnosis Date Noted  . UTI (urinary tract infection) 04/22/2017  . Acute lower UTI 04/22/2017  . Tremor 04/22/2017  . Acute pyelonephritis 09/29/2012  . Nausea 09/29/2012  . Fever 09/29/2012  . HTN (hypertension) 09/29/2012  . H/O: CVA (cerebrovascular accident) 09/29/2012  . Hearing loss 09/29/2012    Past  Surgical History:  Procedure Laterality Date  . CYSTOSCOPY WITH RETROGRADE URETHROGRAM N/A 10/23/2015   Procedure: CYSTOSCOPY WITH RETROGRADE URETHROGRAM;  Surgeon: [name], MD;  Location: [facility];  Service: Urology;  Laterality: N/A;  . CYSTOSCOPY WITH URETHRAL DILATATION N/A 10/23/2015   Procedure: CYSTOSCOPY WITH URETHRAL BALLOON DILATATION;  Surgeon: [name], MD;  Location: [facility];  Service: Urology;  Laterality: N/A;  BALLOON DILATION   . IVC FILTER PLACEMENT ([place] HX)  2008       Home Medications    Prior to Admission medications   Medication Sig Start Date End Date Taking? Authorizing Provider  aspirin 81 MG tablet Take 81 mg by mouth daily.   Yes [provider]  losartan (COZAAR) 50 MG tablet Take 50 mg by mouth every morning.   Yes [provider]  Multiple Vitamin (MULTIVITAMIN) tablet Take 1 tablet by mouth daily.   Yes [provider]  sertraline (ZOLOFT) 25 MG tablet Take 25 mg by mouth daily.   Yes [provider]  simvastatin (ZOCOR) 20 MG tablet Take 20 mg by mouth every evening.   Yes [provider]  cephALEXin (KEFLEX) 500 MG capsule Take 1 capsule (500 mg total) by mouth  2 (two) times daily. 04/23/17 04/29/17  Cristal Ford, DO  HYDROcodone-acetaminophen (NORCO/VICODIN) 5-325 MG tablet Take 1-2 tablets by mouth every 6 (six) hours as needed. Patient not taking: Reported on 01/15/2016 10/23/15   [name], MD    Family History Family History  Problem Relation Age of Onset  . Diabetes Sister   . Lung cancer Brother        Post 9/11 voluntary work in [place] History  Substance Use Topics  . Smoking status: Former Smoker    Years: 10.00    Types: Cigarettes    Quit date: 10/26/1970  . Smokeless tobacco: Never Used  . Alcohol use No     Allergies   Propofol   Review of Systems Review of Systems  ROS reviewed and all otherwise negative  except for mentioned in HPI   Physical Exam Updated Vital Signs BP 136/86   Pulse 75   Temp 98.2 F (36.8 C) (Oral)   Resp 20   Ht 6\' 2"  (1.88 m)   Wt 100 kg (220 lb 8 oz)   SpO2 100%   BMI 28.31 kg/m  Vitals reviewed Physical Exam  Physical Examination: General appearance - alert, chronically ill appearing, and in no distress Mental status - alert, oriented to person, place, and time Eyes - pupils equal and reactive, extraocular eye movements intact Mouth - mucous membranes moist, pharynx normal without lesions Neck - supple, no significant adenopathy Chest - clear to auscultation, no wheezes, rales or rhonchi, symmetric air entry Heart - normal rate, regular rhythm, normal S1, S2, no murmurs, rubs, clicks or gallops Abdomen - soft, nontender, nondistended, no masses or organomegaly Neurological - alert, oriented x 3, no facial droop, no cranial nerve defect, left sided paresis, right upper and lower extremity resting tremor, RUE with increased/intention tremor with FTN testing but no dysmetria, strength of RUE and RLE 5/5  Extremities - peripheral pulses normal, no pedal edema, no clubbing or cyanosis Skin - normal coloration and turgor, no rashes   ED Treatments / Results  Labs (all labs ordered are listed, but only abnormal results are displayed) Labs Reviewed  URINE CULTURE - Abnormal; Notable for the following:       Result Value   Culture MULTIPLE SPECIES PRESENT, SUGGEST RECOLLECTION (*)    All other components within normal limits  COMPREHENSIVE METABOLIC PANEL - Abnormal; Notable for the following:    Glucose, Bld 140 (*)    All other components within normal limits  URINALYSIS, ROUTINE W REFLEX MICROSCOPIC - Abnormal; Notable for the following:    APPearance CLOUDY (*)    Glucose, UA 50 (*)    Hgb urine dipstick MODERATE (*)    Nitrite POSITIVE (*)    Leukocytes, UA LARGE (*)    Bacteria, UA MANY (*)    Squamous Epithelial / LPF 0-5 (*)    All other  components within normal limits  APTT - Abnormal; Notable for the following:    aPTT 23 (*)    All other components within normal limits  BASIC METABOLIC PANEL - Abnormal; Notable for the following:    Glucose, Bld 120 (*)    All other components within normal limits  CBC  PROTIME-INR  CBC  CREATININE, SERUM  CBC  TSH  VITAMIN B12  FOLATE  VITAMIN B1    EKG  EKG Interpretation  Date/Time:  Thursday April 22 2017 08:22:22 EDT Ventricular Rate:  98 PR Interval:    QRS Duration:  93 QT Interval:  336 QTC Calculation: 429 R Axis:   20 Text Interpretation:  Sinus rhythm No significant change since last tracing Confirmed by Alfonzo Beers [phone]) on 04/22/2017 10:17:56 AM       Radiology Ct Head Wo Contrast  Result Date: 04/22/2017 CLINICAL DATA:  Right arm and leg tremor 2 months with worsening EXAM: CT HEAD WITHOUT CONTRAST TECHNIQUE: Contiguous axial images were obtained from the base of the skull through the vertex without intravenous contrast. COMPARISON:  None. FINDINGS: Brain: Moderately large chronic infarct in the right frontal lobe. Chronic infarct right basal ganglia. Negative for acute infarct. Negative for hemorrhage or mass. Negative for hydrocephalus. Enlargement of the right lateral ventricle due to volume loss from chronic infarction. Vascular: Negative for hyperdense vessel Skull: Negative Sinuses/Orbits: Negative Other: None IMPRESSION: Chronic right frontal infarct.  No acute abnormality Electronically Signed   By: Franchot Gallo M.D.   On: 04/22/2017 10:28   Mr Brain Wo Contrast  Result Date: 04/22/2017 CLINICAL DATA:  71 y/o M; worsening tremors on the right side of body. EXAM: MRI HEAD WITHOUT CONTRAST TECHNIQUE: Multiplanar, multiecho pulse sequences of the brain and surrounding structures were obtained without intravenous contrast. COMPARISON:  04/14/2017 CT of the head. FINDINGS: Brain: No acute infarction, hemorrhage, hydrocephalus, extra-axial collection or  mass lesion. Large chronic right MCA distribution infarction involving lateral frontal lobe and superior temporal lobe as well as the right basal ganglia and insula. Ex vacuo dilatation of the right lateral ventricle. Wallerian degeneration of right cortical spinal tracts extending into the cerebral peduncle and brainstem. Mild hemosiderin staining of the right MCA distribution infarction. Otherwise no significant susceptibility hypointensity in the brain. Vascular: The diminished flow void in the right internal carotid artery in the petrous and cavernous segments. Normal flow void of the right internal carotid artery terminal segment and other central flow voids in the brain. In Skull and upper cervical spine: Normal marrow signal. Sinuses/Orbits: Negative. Other: 22 mm nodule within the left posterior neck subcutaneous fat probably representing a dermal appendage cyst. IMPRESSION: 1. No acute intracranial abnormality. No finding as explanation for right-sided tremors. 2. Large right MCA distribution chronic infarction. Absent flow void in the right internal carotid artery petrous and cavernous segments may represent slow flow or occlusion. 3. 2 cm nodule in left posterior neck subcutaneous fat, probably a dermal appendage cysts, direct visualization recommended. Electronically Signed   By: Kristine Garbe M.D.   On: 04/22/2017 23:54    Procedures Procedures (including critical care time)  Medications Ordered in ED Medications  sertraline (ZOLOFT) tablet 25 mg (not administered)  losartan (COZAAR) tablet 50 mg (50 mg Oral Given 04/23/17 0928)  simvastatin (ZOCOR) tablet 20 mg (20 mg Oral Given 04/22/17 2052)  enoxaparin (LOVENOX) injection 40 mg (40 mg Subcutaneous Given 04/22/17 2051)  sodium chloride flush (NS) 0.9 % injection 3 mL (3 mLs Intravenous Not Given 04/23/17 1000)  0.9 %  sodium chloride infusion ( Intravenous New Bag/Given 04/23/17 0053)  acetaminophen (TYLENOL) tablet 650 mg (not  administered)    Or  acetaminophen (TYLENOL) suppository 650 mg (not administered)  ondansetron (ZOFRAN) tablet 4 mg (not administered)    Or  ondansetron (ZOFRAN) injection 4 mg (not administered)  aspirin EC tablet 81 mg (81 mg Oral Given 04/23/17 0928)  multivitamin with minerals tablet 1 tablet (1 tablet Oral Given 04/23/17 0928)  cefTRIAXone (ROCEPHIN) 1 g in dextrose 5 % 50 mL IVPB (0 g Intravenous Stopped 04/23/17 0959)  cefTRIAXone (ROCEPHIN) 1  g in dextrose 5 % 50 mL IVPB (0 g Intravenous Stopped 04/22/17 1155)     Initial Impression / Assessment and Plan / ED Course  I have reviewed the triage vital signs and the nursing notes.  Pertinent labs & imaging results that were available during my care of the patient were reviewed by me and considered in my medical decision making (see chart for details).    11:55 AM  D/w neurology on call, agrees with MRI brain.  UTI could have made this tremor worse today.  Rocephin IV ordered.    Due to UTI worsening the tremor effects and patient not able to ambulate or feed himself- started on abx and will plan to admit overnight for treatment of UTI  while awaiting MRI to be obtained.   4:29 PM d/w triad for admission,  They will see patient in the ED and write orders for observation.   4:34 PMD/w patient and wife and they are agreeable with the plan for admission.      Final Clinical Impressions(s) / ED Diagnoses   Final diagnoses:  Tremor  Urinary tract infection without hematuria, site unspecified    New Prescriptions Current Discharge Medication List    START taking these medications   Details  cephALEXin (KEFLEX) 500 MG capsule Take 1 capsule (500 mg total) by mouth 2 (two) times daily. Qty: 12 capsule, Refills: 0         Alfonzo Beers, MD 04/23/17 [phone]

## 2017-04-22 NOTE — ED Notes (Addendum)
Attempted to call report and they advised they would call back, didn't know there was a pt coming up to that room.

## 2017-04-22 NOTE — ED Triage Notes (Signed)
Pt here with increased shaking/tremors on right side. Hx of this since stroke, but it has increased this morning. Previous stroke affecting left side. BP 149/88, CBG 144, HR 110. Pt alert and oriented.

## 2017-04-22 NOTE — ED Notes (Addendum)
MRI tech called back and advised the RAD said they could do the MRI and the pt would put back into the rotation.

## 2017-04-22 NOTE — Progress Notes (Signed)
New Admission Note:   Arrival Method: stretcher from ED Mental Orientation: alert and orientated x 4  Telemetry: box 15  Assessment: Completed Skin: see flow sheet  IV: right FA infusing   Pain:denies  Tubes:none  Safety Measures: Safety Fall Prevention Plan has been given, discussed and signed Admission: Completed [address] Orientation: Patient has been orientated to the room, unit and staff.  Family: wife at bedside   Orders have been reviewed and implemented. Will continue to monitor the patient. Call light has been placed within reach and bed alarm has been activated.   Emilio Math, RN [facility] [address]  Phone number: [phone]

## 2017-04-22 NOTE — H&P (Signed)
[facility] History and Physical  Robert [name] MVH:[id number] DOB: 06/22/1946 DOA: 04/22/2017  PCP: Farris Has, MD  Patient coming from: home  Chief Complaint: Worsening Tremors  HPI: Robert [name] is a 71 y.o. male with a medical history of CVA with left-sided residual weakness, history of DVT with IVC filter placement, hypertension, who presented to the emergency department today with increased tremors. Patient noted to have tremors over the last several months of the right side of his body however today worsened. Patient unable to eat over the last couple of days secondary to his tremor. Tremors have also affected his ambulation. Patient did start a new medication is Zoloft approximately one week ago. Patient currently denies any recent travel, ill contacts. Denies any dysuria or pyuria. Patient's wife notes his urine was very concentrated and dark over the weekend. His chest pain, shortness of breath, abdominal pain, nausea or vomiting, diarrhea, problems with urination, dizziness, headache, visual changes. Patient does endorse constipation.  ED Course: Found to have a UTI, started on ceftriaxone. CT head unremarkable. Neurology consulted, felt patient should be observed overnight for tremor. TR[facility] called for admission.   Review of Systems:  All other systems reviewed and are negative.   Past Medical History:  Diagnosis Date  . Frequency of urination   . GERD (gastroesophageal reflux disease)   . Gross hematuria   . History of acute pyelonephritis    10-13-2012  . History of CVA with residual deficit    2008--  left side of body weakness and foot drop (wears leg brace and uses cane)  . History of DVT of lower extremity    2008--  cva  . Hyperlipidemia   . Hypertension   . 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 Urethral stricture   . Urgency of urination   . Weakness of left side of body    secondary to  cva 2008  . Wears glasses   . Wears hearing aid    bilateral-- wears intermittantly    Past Surgical History:  Procedure Laterality Date  . CYSTOSCOPY WITH RETROGRADE URETHROGRAM N/A 10/23/2015   Procedure: CYSTOSCOPY WITH RETROGRADE URETHROGRAM;  Surgeon: Barron Alvine, MD;  Location: [facility];  Service: Urology;  Laterality: N/A;  . CYSTOSCOPY WITH URETHRAL DILATATION N/A 10/23/2015   Procedure: CYSTOSCOPY WITH URETHRAL BALLOON DILATATION;  Surgeon: Barron Alvine, MD;  Location: [facility];  Service: Urology;  Laterality: N/A;  BALLOON DILATION   . IVC FILTER PLACEMENT ([facility] HX)  2008    Social History:  reports that he quit smoking about 46 years ago. His smoking use included Cigarettes. He quit after 10.00 years of use. He has never used smokeless tobacco. He reports that he does not drink alcohol or use drugs.   Allergies  Allergen Reactions  . Propofol Other (See Comments)    "hiccups for weeks"    Family History  Problem Relation Age of Onset  . Diabetes Sister   . Lung cancer Brother        Post 9/11 voluntary work in [employer]     Prior to Admission medications   Medication Sig Start Date End Date Taking? Authorizing Provider  aspirin 81 MG tablet Take 81 mg by mouth daily.   Yes [provider]  losartan (COZAAR) 50 MG tablet Take 50 mg by mouth every morning.   Yes [provider]  Multiple Vitamin (MULTIVITAMIN) tablet Take 1 tablet by mouth daily.   Yes [provider]  sertraline (ZOLOFT) 25 MG tablet Take 25 mg by mouth daily.   Yes [provider]  simvastatin (ZOCOR) 20 MG tablet Take 20 mg by mouth every evening.   Yes [provider]  HYDROcodone-acetaminophen (NORCO/VICODIN) 5-325 MG tablet Take 1-2 tablets by mouth every 6 (six) hours as needed. Patient not taking: Reported on 01/15/2016 10/23/15   Barron Alvine, MD    Physical Exam: Vitals:   04/22/17 1615 04/22/17 1630  BP: (!)  143/90 (!) 144/91  Pulse: 76 72  Resp: 19 18  Temp:       General: Well developed, well nourished, NAD, appears stated age  HEENT: NCAT, PERRLA, EOMI, Anicteic Sclera, mucous membranes moist.   Neck: Supple, no JVD, no masses  Cardiovascular: S1 S2 auscultated, no rubs, murmurs or gallops. Regular rate and rhythm.  Respiratory: Clear to auscultation bilaterally with equal chest rise  Abdomen: Soft, nontender, nondistended, + bowel sounds  Extremities: warm dry without cyanosis clubbing. Trace Lower extremity edema (L>R)  Neuro: AAOx3, cranial nerves grossly intact. Left sided weakness (chronic), Right upper/lower ext intention tremor (improves with rest)  Skin: Without rashes exudates or nodules  Psych: Normal affect and demeanor with intact judgement and insight  Labs on Admission: I have personally reviewed following labs and imaging studies CBC:  Recent Labs Lab 04/22/17 0930  WBC 6.4  HGB 15.1  HCT 45.7  MCV 88.2  PLT 262   Basic Metabolic Panel:  Recent Labs Lab 04/22/17 0930  NA 137  K 3.7  CL 104  CO2 25  GLUCOSE 140*  BUN 10  CREATININE 0.97  CALCIUM 9.1   GFR: Estimated Creatinine Clearance: 92.9 mL/min (by C-G formula based on SCr of 0.97 mg/dL). Liver Function Tests:  Recent Labs Lab 04/22/17 0930  AST 20  ALT 20  ALKPHOS 71  BILITOT 1.0  PROT 6.6  ALBUMIN 3.5   No results for input(s): LIPASE, AMYLASE in the last 168 hours. No results for input(s): AMMONIA in the last 168 hours. Coagulation Profile:  Recent Labs Lab 04/22/17 0930  INR 1.10   Cardiac Enzymes: No results for input(s): CKTOTAL, CKMB, CKMBINDEX, TROPONINI in the last 168 hours. BNP (last 3 results) No results for input(s): PROBNP in the last 8760 hours. HbA1C: No results for input(s): HGBA1C in the last 72 hours. CBG: No results for input(s): GLUCAP in the last 168 hours. Lipid Profile: No results for input(s): CHOL, HDL, LDLCALC, TRIG, CHOLHDL, LDLDIRECT in  the last 72 hours. Thyroid Function Tests: No results for input(s): TSH, T4TOTAL, FREET4, T3FREE, THYROIDAB in the last 72 hours. Anemia Panel: No results for input(s): VITAMINB12, FOLATE, FERRITIN, TIBC, IRON, RETICCTPCT in the last 72 hours. Urine analysis:    Component Value Date/Time   COLORURINE YELLOW 04/22/2017 0932   APPEARANCEUR CLOUDY (A) 04/22/2017 0932   LABSPEC 1.017 04/22/2017 0932   PHURINE 5.0 04/22/2017 0932   GLUCOSEU 50 (A) 04/22/2017 0932   HGBUR MODERATE (A) 04/22/2017 0932   BILIRUBINUR NEGATIVE 04/22/2017 0932   KETONESUR NEGATIVE 04/22/2017 0932   PROTEINUR NEGATIVE 04/22/2017 0932   UROBILINOGEN 1.0 07/16/2013 1555   NITRITE POSITIVE (A) 04/22/2017 0932   LEUKOCYTESUR LARGE (A) 04/22/2017 0932   Sepsis Labs: @LABRCNTIP (procalcitonin:4,lacticidven:4) )No results found for this or any previous visit (from the past 240 hour(s)).   Radiological Exams on Admission: Ct Head Wo Contrast  Result Date: 04/22/2017 CLINICAL DATA:  Right  arm and leg tremor 2 months with worsening EXAM: CT HEAD WITHOUT CONTRAST TECHNIQUE: Contiguous axial images were obtained from the base of the skull through the vertex without intravenous contrast. COMPARISON:  None. FINDINGS: Brain: Moderately large chronic infarct in the right frontal lobe. Chronic infarct right basal ganglia. Negative for acute infarct. Negative for hemorrhage or mass. Negative for hydrocephalus. Enlargement of the right lateral ventricle due to volume loss from chronic infarction. Vascular: Negative for hyperdense vessel Skull: Negative Sinuses/Orbits: Negative Other: None IMPRESSION: Chronic right frontal infarct.  No acute abnormality Electronically Signed   By: Marlan Palau M.D.   On: 04/22/2017 10:28    EKG: Independently reviewed. Sinus rhythm, rate 98 Assessment/Plan  Urinary tract infection -Patient currently afebrile with no leukocytosis -UA: Many bacteria, TNTC WBC, positive nitrites, large  leukocytes -Urine culture pending -Will place on IV ceftriaxone  Tremor -Per wife and patient, has been ongoing for several months however worsened this morning. -Suspect worsening of tremor due to urinary tract infection -CT head: Chronic right frontal infarct, no acute abnormality -MRI brain pending -Ordered TSH, vitamin B-12, vitamin B 1, folate -Patient does have appointment with [facility] on 05/12/2017 -Conduct neuro checks -Will consult PT and OT  History of CVA -With residual left-sided weakness -Continue aspirin, statin  History of DVT -s/p IVC filter placement in 2008  Essential hypertension -Continue losartan  Hyperlipidemia -Continue statin  Depression -Continue Zoloft  DVT prophylaxis: Lovenox  Code Status: Full  Family Communication: Wife at bedside. Admission, patients condition and plan of care including tests being ordered have been discussed with the patient and wife who indicate understanding and agree with the plan and Code Status.  Disposition Plan: Home   Consults called: Neurology by EDP  Admission status: observatino   Time spent: 70 minutes  Raylea Adcox D.O. [facility] Pager [phone]  If 7PM-7AM, please contact night-coverage [email or web address] Password [facility] 04/22/2017, 5:21 PM

## 2017-04-22 NOTE — ED Notes (Signed)
MRI called in regards to the pt's IVF filter in his leg. Tech advised they cannot do the MRI until the device is determined to have metal or not.

## 2017-04-23 DIAGNOSIS — N390 Urinary tract infection, site not specified: Secondary | ICD-10-CM | POA: Diagnosis not present

## 2017-04-23 DIAGNOSIS — Z8673 Personal history of transient ischemic attack (TIA), and cerebral infarction without residual deficits: Secondary | ICD-10-CM | POA: Diagnosis not present

## 2017-04-23 DIAGNOSIS — R251 Tremor, unspecified: Secondary | ICD-10-CM | POA: Diagnosis not present

## 2017-04-23 DIAGNOSIS — I159 Secondary hypertension, unspecified: Secondary | ICD-10-CM | POA: Diagnosis not present

## 2017-04-23 LAB — CBC
HCT: 44.4 % (ref 39.0–52.0)
Hemoglobin: 14.9 g/dL (ref 13.0–17.0)
MCH: 29.8 pg (ref 26.0–34.0)
MCHC: 33.6 g/dL (ref 30.0–36.0)
MCV: 88.8 fL (ref 78.0–100.0)
Platelets: 261 10*3/uL (ref 150–400)
RBC: 5 MIL/uL (ref 4.22–5.81)
RDW: 13.4 % (ref 11.5–15.5)
WBC: 7.1 10*3/uL (ref 4.0–10.5)

## 2017-04-23 LAB — BASIC METABOLIC PANEL
Anion gap: 7 (ref 5–15)
BUN: 12 mg/dL (ref 6–20)
CO2: 26 mmol/L (ref 22–32)
Calcium: 9 mg/dL (ref 8.9–10.3)
Chloride: 104 mmol/L (ref 101–111)
Creatinine, Ser: 1.03 mg/dL (ref 0.61–1.24)
GFR calc Af Amer: >60 mL/min (ref >60)
GFR calc non Af Amer: >60 mL/min (ref >60)
Glucose, Bld: 120 mg/dL — ABNORMAL HIGH (ref 65–99)
Potassium: 3.8 mmol/L (ref 3.5–5.1)
Sodium: 137 mmol/L (ref 135–145)

## 2017-04-23 LAB — URINE CULTURE

## 2017-04-23 MED ORDER — CEPHALEXIN 500 MG PO CAPS: 500.0000 mg | ORAL_CAPSULE | Freq: Two times a day (BID) | ORAL | 0 refills | Status: AC

## 2017-04-23 NOTE — Care Management Note (Signed)
Case Management Note  Patient Details  Name: Abdou Stocks MRN: [id number] Date of Birth: Oct 19, 1946  Subjective/Objective:                 Spoke with patient and spouse at bedside. They would like to use [facility] for [facility]. Patient has cane at home, declines need for additional DME. Referral placed to [facility]. No other needs identified.    Action/Plan:  DC to home w [facility].  Expected Discharge Date:  04/23/17               Expected Discharge Plan:  [place]  In-House Referral:     Discharge planning Services  CM Consult  Post Acute Care Choice:  Home Health Choice offered to:  Patient, Spouse  DME Arranged:    DME Agency:     HH Arranged:  PT [place]:  [facility]  Status of Service:  Completed, signed off  If discussed at [place] of Stay Meetings, dates discussed:    Additional Comments:  Carles Collet, RN 04/23/2017, 1:51 PM

## 2017-04-23 NOTE — Evaluation (Signed)
Physical Therapy Evaluation Patient Details Name: Robert [name] MRN: [id number] DOB: 1946/07/17 Today's Date: 04/23/2017   History of Present Illness  Pt is a 71 yo male admitted through ED with worsening tremors on 04/22/17. Pt was diagnosed with a UTI. 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   Clinical Impression  Pt presents with the above diagnosis and below deficits for therapy evaluation. Prior to admission, pt lived with his wife in a second story apartment. Pt had two flights of stairs to get up to second level and then his apartment is all on one level. Pt's wife assisted with bathing, dressing, and meal prep. Pt's wife works, however. Pt requires Min A to min guard for all mobility this session including gait with [place]. Pt's wife is concerned about taking pt home and assisting him up the steps. Pt will benefit from continued acute PT services in order to address the below deficits prior to discharge home.     Follow Up Recommendations Home health PT;Supervision for mobility/OOB    Equipment Recommendations  None recommended by PT    Recommendations for Other Services OT consult     Precautions / Restrictions Precautions Precautions: Fall Restrictions Weight Bearing Restrictions: No      Mobility  Bed Mobility Overal bed mobility: Needs Assistance Bed Mobility: Supine to Sit;Sit to Supine     Supine to sit: HOB elevated;Min guard Sit to supine: Min assist   General bed mobility comments: Min gaurd to sit EOB with HOB elevated, Min A to bring LE's into bed and lay supine  Transfers Overall transfer level: Needs assistance Equipment used: Straight cane Transfers: Sit to/from Stand Sit to Stand: Mod assist;Min assist         General transfer comment: Mod A to stand initially and Min A to stand from EOB to remove gown  Ambulation/Gait Ambulation/Gait assistance: Min assist;Min guard Ambulation Distance (Feet): 150  Feet Assistive device: Straight cane Gait Pattern/deviations: Step-to pattern;Decreased step length - right;Decreased stance time - left;Decreased dorsiflexion - left;Decreased weight shift to left Gait velocity: decreased Gait velocity interpretation: Below normal speed for age/gender General Gait Details: pt wearing AFO on LLE, no heel strike LLE with right lateral trunk lean to clear LLE during swing.   Stairs            Wheelchair Mobility    Modified Rankin (Stroke Patients Only)       Balance Overall balance assessment: Needs assistance Sitting-balance support: No upper extremity supported;Feet supported Sitting balance-Leahy Scale: Fair     Standing balance support: Single extremity supported Standing balance-Leahy Scale: Fair                               Pertinent Vitals/Pain Pain Assessment: No/denies pain    Home Living Family/patient expects to be discharged to:: Private residence Living Arrangements: Spouse/significant other Available Help at Discharge: Family;Available PRN/intermittently Type of Home: House Home Access: Stairs to enter Entrance Stairs-Rails: Right Entrance Stairs-Number of Steps: 2 flights Home Layout: One level Home Equipment: Cane - single point;Shower seat;Grab bars - toilet;Grab bars - tub/shower;Hand held shower head;Wheelchair - manual      Prior Function Level of Independence: Needs assistance   Gait / Transfers Assistance Needed: ambulates with [place]   ADL's / Homemaking Assistance Needed: wife assists with ADLs and IADLs and meal prep        Hand Dominance  Dominant Hand: Right    Extremity/Trunk Assessment   Upper Extremity Assessment Upper Extremity Assessment: Defer to OT evaluation    Lower Extremity Assessment Lower Extremity Assessment: LLE deficits/detail LLE Deficits / Details: decreases strength and 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     Cervical / Trunk Assessment Cervical / Trunk  Assessment: Normal  Communication   Communication: No difficulties  Cognition Arousal/Alertness: Awake/alert Behavior During Therapy: WFL for tasks assessed/performed Overall Cognitive Status: Within Functional Limits for tasks assessed                                        General Comments      Exercises     Assessment/Plan    PT Assessment Patient needs continued PT services  PT Problem List Decreased activity tolerance;Decreased balance;Decreased mobility;Decreased knowledge of use of DME       PT Treatment Interventions DME instruction;Gait training;Stair training;Functional mobility training;Therapeutic activities;Therapeutic exercise;Balance training    PT Goals (Current goals can be found in the Care Plan section)  Acute Rehab PT Goals Patient Stated Goal: to get home PT Goal Formulation: With patient/family Time For Goal Achievement: 05/07/17 Potential to Achieve Goals: Good    Frequency Min 3X/week   Barriers to discharge        Co-evaluation               AM-PAC PT "6 Clicks" Daily Activity  Outcome Measure Difficulty turning over in bed (including adjusting bedclothes, sheets and blankets)?: Total Difficulty moving from lying on back to sitting on the side of the bed? : Total Difficulty sitting down on and standing up from a chair with arms (e.g., wheelchair, bedside commode, etc,.)?: Total Help needed moving to and from a bed to chair (including a wheelchair)?: A Little Help needed walking in hospital room?: A Little Help needed climbing 3-5 steps with a railing? : A Lot 6 Click Score: 11    End of Session Equipment Utilized During Treatment: Gait belt Activity Tolerance: Patient tolerated treatment well Patient left: in bed;with call bell/phone within reach;with family/visitor present Nurse Communication: Mobility status PT Visit Diagnosis: Unsteadiness on feet (R26.81);Muscle weakness (generalized) (M62.81);Difficulty in  walking, not elsewhere classified (R26.2)    Time: 5465-0354 PT Time Calculation (min) (ACUTE ONLY): 32 min   Charges:   PT Evaluation $PT Eval Moderate Complexity: 1 Procedure PT Treatments $Gait Training: 8-22 mins   PT G Codes:   PT G-Codes **NOT FOR INPATIENT CLASS** Functional Assessment Tool Used: AM-PAC 6 Clicks Basic Mobility;Clinical judgement Functional Limitation: Mobility: Walking and moving around Mobility: Walking and Moving Around Current Status (S5681): At least 60 percent but less than 80 percent impaired, limited or restricted Mobility: Walking and Moving Around Goal Status [id number]): At least 20 percent but less than 40 percent impaired, limited or restricted    Scheryl Marten PT, DPT  [phone]   Shanon Rosser 04/23/2017, 10:00 AM

## 2017-04-23 NOTE — Evaluation (Signed)
Occupational Therapy Evaluation Patient Details Name: Robert [name] MRN: [id number] DOB: August 20, 1946 Today's Date: 04/23/2017    History of Present Illness Pt is a 71 yo male admitted through ED with worsening tremors on 04/22/17. Pt was diagnosed with a UTI. 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    Clinical Impression   Pt reports he required assist from his wife with ADL PTA. Currently pt requires min assist for functional mobility, min assist for UB ADL, and max assist for LB ADL. Pt planning to d/c home with intermittent assist from his wife. Recommending HHOT for follow up to maximize independence and safety with ADL and functional mobility upon return home. Pt would benefit from continued skilled OT to address established goals.    Follow Up Recommendations  Home health OT;Supervision/Assistance - 24 hour    Equipment Recommendations  None recommended by OT    Recommendations for Other Services       Precautions / Restrictions Precautions Precautions: Fall Restrictions Weight Bearing Restrictions: No      Mobility Bed Mobility Overal bed mobility: Needs Assistance Bed Mobility: Supine to Sit     Supine to sit: Min guard;HOB elevated Sit to supine: Min assist   General bed mobility comments: Increased time and effort. HOB maximially elevated with heavy reliance on bed rail  Transfers Overall transfer level: Needs assistance Equipment used: Straight cane Transfers: Sit to/from Stand Sit to Stand: Min assist;From elevated surface         General transfer comment: Min assist to boost up from elevated EOB. Wife present and assisting as needed    Balance Overall balance assessment: Needs assistance Sitting-balance support: Feet supported Sitting balance-Leahy Scale: Fair     Standing balance support: Single extremity supported Standing balance-Leahy Scale: Fair                             ADL either  performed or assessed with clinical judgement   ADL Overall ADL's : Needs assistance/impaired Eating/Feeding: Set up;Sitting   Grooming: Minimal assistance;Sitting   Upper Body Bathing: Minimal assistance;Sitting   Lower Body Bathing: Maximal assistance;Sit to/from stand   Upper Body Dressing : Minimal assistance;Sitting   Lower Body Dressing: Maximal assistance;Sit to/from stand Lower Body Dressing Details (indicate cue type and reason): wife donning AFO and shoes Toilet Transfer: Minimal assistance;Ambulation (cane) Toilet Transfer Details (indicate cue type and reason): Simulated by sit to stand from EOB with functional mobility         Functional mobility during ADLs: Minimal assistance;Cane       Vision         Perception     Praxis      Pertinent Vitals/Pain Pain Assessment: No/denies pain     Hand Dominance Right   Extremity/Trunk Assessment Upper Extremity Assessment Upper Extremity Assessment: LUE deficits/detail;RUE deficits/detail RUE Deficits / Details: resting tremor LUE Deficits / Details: limited ROM due to prior CVA   Lower Extremity Assessment Lower Extremity Assessment: Defer to PT evaluation LLE Deficits / Details: decreases strength and 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    Cervical / Trunk Assessment Cervical / Trunk Assessment: Normal   Communication Communication Communication: No difficulties   Cognition Arousal/Alertness: Awake/alert Behavior During Therapy: WFL for tasks assessed/performed Overall Cognitive Status: Within Functional Limits for tasks assessed  General Comments       Exercises     Shoulder Instructions      Home Living Family/patient expects to be discharged to:: Private residence Living Arrangements: Spouse/significant other Available Help at Discharge: Family;Available PRN/intermittently Type of Home: Apartment Home Access: Stairs to  enter Entrance Stairs-Number of Steps: 2 flights Entrance Stairs-Rails: Right Home Layout: One level     Bathroom Shower/Tub: Teacher, early years/pre: Standard     Home Equipment: Cane - single point;Shower seat;Grab bars - toilet;Grab bars - tub/shower;Hand held shower head;Wheelchair - manual;Toilet riser          Prior Functioning/Environment Level of Independence: Needs assistance  Gait / Transfers Assistance Needed: ambulates with [place]  ADL's / Homemaking Assistance Needed: wife assists with ADLs and IADLs and meal prep            OT Problem List: Decreased strength;Decreased range of motion;Decreased activity tolerance;Impaired balance (sitting and/or standing);Decreased knowledge of use of DME or AE;Impaired sensation;Impaired tone;Obesity;Impaired UE functional use      OT Treatment/Interventions: Self-care/ADL training;Therapeutic exercise;Neuromuscular education;Energy conservation;DME and/or AE instruction;Therapeutic activities;Patient/family education;Balance training    OT Goals(Current goals can be found in the care plan section) Acute Rehab OT Goals Patient Stated Goal: to get home OT Goal Formulation: With patient/family Time For Goal Achievement: 05/07/17 Potential to Achieve Goals: Good ADL Goals Pt Will Transfer to Toilet: with supervision;ambulating;bedside commode (over toilet) Pt Will Perform Toileting - Clothing Manipulation and hygiene: with supervision;sit to/from stand Pt Will Perform Tub/Shower Transfer: with min assist;Tub transfer;ambulating;shower seat;grab bars  OT Frequency: Min 2X/week   Barriers to D/C: Decreased caregiver support  wife works during the day       Co-evaluation              AM-PAC PT "6 Clicks" Daily Activity     Outcome Measure Help from another person eating meals?: A Little Help from another person taking care of personal grooming?: A Lot Help from another person toileting, which includes using  toliet, bedpan, or urinal?: A Lot Help from another person bathing (including washing, rinsing, drying)?: A Lot Help from another person to put on and taking off regular upper body clothing?: A Little Help from another person to put on and taking off regular lower body clothing?: A Lot 6 Click Score: 14   End of Session Equipment Utilized During Treatment: Other (comment) (cane, AFO) Nurse Communication: Mobility status  Activity Tolerance: Patient tolerated treatment well Patient left: in chair;with call bell/phone within reach;with family/visitor present  OT Visit Diagnosis: Unsteadiness on feet (R26.81);Other abnormalities of gait and mobility (R26.89)                Time: 1610-9604 OT Time Calculation (min): 18 min Charges:  OT General Charges $OT Visit: 1 Procedure OT Evaluation $OT Eval Moderate Complexity: 1 Procedure G-Codes: OT G-codes **NOT FOR INPATIENT CLASS** Functional Assessment Tool Used: AM-PAC 6 Clicks Daily Activity Functional Limitation: Self care Self Care Current Status (V4098): At least 40 percent but less than 60 percent impaired, limited or restricted Self Care Goal Status (J1914): At least 20 percent but less than 40 percent impaired, limited or restricted   Mel Almond A. Ulice Brilliant, M.S., OTR/L Pager: [place] 04/23/2017, 1:33 PM

## 2017-04-23 NOTE — Discharge Instructions (Signed)
Urinary Tract Infection, Adult A urinary tract infection (UTI) is an infection of any part of the urinary tract, which includes the kidneys, ureters, bladder, and urethra. These organs make, store, and get rid of urine in the body. UTI can be a bladder infection (cystitis) or kidney infection (pyelonephritis). What are the causes? This infection may be caused by fungi, viruses, or bacteria. Bacteria are the most common cause of UTIs. This condition can also be caused by repeated incomplete emptying of the bladder during urination. What increases the risk? This condition is more likely to develop if:  You ignore your need to urinate or hold urine for long periods of time.  You do not empty your bladder completely during urination.  You wipe back to front after urinating or having a bowel movement, if you are male.  You are uncircumcised, if you are male.  You are constipated.  You have a urinary catheter that stays in place (indwelling).  You have a weak defense (immune) system.  You have a medical condition that affects your bowels, kidneys, or bladder.  You have diabetes.  You take antibiotic medicines frequently or for long periods of time, and the antibiotics no longer work well against certain types of infections (antibiotic resistance).  You take medicines that irritate your urinary tract.  You are exposed to chemicals that irritate your urinary tract.  You are male.  What are the signs or symptoms? Symptoms of this condition include:  Fever.  Frequent urination or passing small amounts of urine frequently.  Needing to urinate urgently.  Pain or burning with urination.  Urine that smells bad or unusual.  Cloudy urine.  Pain in the lower abdomen or back.  Trouble urinating.  Blood in the urine.  Vomiting or being less hungry than normal.  Diarrhea or abdominal pain.  Vaginal discharge, if you are male.  How is this diagnosed? This condition is  diagnosed with a medical history and physical exam. You will also need to provide a urine sample to test your urine. Other tests may be done, including:  Blood tests.  Sexually transmitted disease (STD) testing.  If you have had more than one UTI, a cystoscopy or imaging studies may be done to determine the cause of the infections. How is this treated? Treatment for this condition often includes a combination of two or more of the following:  Antibiotic medicine.  Other medicines to treat less common causes of UTI.  Over-the-counter medicines to treat pain.  Drinking enough water to stay hydrated.  Follow these instructions at home:  Take over-the-counter and prescription medicines only as told by your health care provider.  If you were prescribed an antibiotic, take it as told by your health care provider. Do not stop taking the antibiotic even if you start to feel better.  Avoid alcohol, caffeine, tea, and carbonated beverages. They can irritate your bladder.  Drink enough fluid to keep your urine clear or pale yellow.  Keep all follow-up visits as told by your health care provider. This is important.  Make sure to: ? Empty your bladder often and completely. Do not hold urine for long periods of time. ? Empty your bladder before and after sex. ? Wipe from front to back after a bowel movement if you are male. Use each tissue one time when you wipe. Contact a health care provider if:  You have back pain.  You have a fever.  You feel nauseous or vomit.  Your symptoms do not  get better after 3 days.  Your symptoms go away and then return. Get help right away if:  You have severe back pain or lower abdominal pain.  You are vomiting and cannot keep down any medicines or water. This information is not intended to replace advice given to you by your health care provider. Make sure you discuss any questions you have with your health care provider. Document Released:  07/22/2005 Document Revised: 03/25/2016 Document Reviewed: 09/02/2015 Elsevier Interactive Patient Education  2017 Elsevier [employer]. Tremor A tremor is trembling or shaking that you cannot control. Most tremors affect the hands or arms. Tremors can also affect the head, vocal cords, face, and other parts of the body. There are many types of tremors. Common types include:  Essential tremor. These usually occur in people over the age of 61. It may run in families and can happen in otherwise healthy people.  Resting tremor. These occur when the muscles are at rest, such as when your hands are resting in your lap. People with Parkinson disease often have resting tremors.  Postural tremor. These occur when you try to hold a pose, such as keeping your hands outstretched.  Kinetic tremor. These occur during purposeful movement, such as trying to touch a finger to your nose.  Task-specific tremor. These may occur when you perform tasks such as handwriting, speaking, or standing.  Psychogenic tremor. These dramatically lessen or disappear when you are distracted. They can happen in people of all ages.  Some types of tremors have no known cause. Tremors can also be a symptom of nervous system problems (neurological disorders) that may occur with aging. Some tremors go away with treatment while others do not. Follow these instructions at home: Watch your tremor for any changes. The following actions may help to lessen any discomfort you are feeling:  Take medicines only as directed by your health care provider.  Limit alcohol intake to no more than 1 drink per day for nonpregnant women and 2 drinks per day for men. One drink equals 12 oz of beer, 5 oz of wine, or 1 oz of hard liquor.  Do not use any tobacco products, including cigarettes, chewing tobacco, or electronic cigarettes. If you need help quitting, ask your health care provider.  Avoid extreme heat or cold.  Limit the amount of caffeine you  consumeas directed by your health care provider.  Try to get 8 hours of sleep each night.  Find ways to manage your stress, such as meditation or yoga.  Keep all follow-up visits as directed by your health care provider. This is important.  Contact a health care provider if:  You start having a tremor after starting a new medicine.  You have tremor with other symptoms such as: ? Numbness. ? Tingling. ? Pain. ? Weakness.  Your tremor gets worse.  Your tremor interferes with your day-to-day life. This information is not intended to replace advice given to you by your health care provider. Make sure you discuss any questions you have with your health care provider. Document Released: 10/02/2002 Document Revised: 06/14/2016 Document Reviewed: 04/09/2014 Elsevier Interactive Patient Education  [employer].

## 2017-04-23 NOTE — Discharge Summary (Addendum)
Physician Discharge Summary  Robert [name] NFA:[id number] DOB: 1946-06-22 DOA: 04/22/2017  PCP: [name], MD  Admit date: 04/22/2017 Discharge date: 04/23/2017  Time spent: 45 minutes  Recommendations for Outpatient Follow-up:  Patient will be discharged to home with home healthy physical therapy.  Patient will need to follow up with primary care provider within one week of discharge.  Follow up with neurology for scheduled appointment. Patient should continue medications as prescribed.  Patient should follow a heart healthy diet.   Discharge Diagnoses:  Urinary tract infection Tremor History of CVA History of DVT Essential hypertension Hyperlipidemia Depression   Discharge Condition: Stable  Diet recommendation: heart healthy  Filed Weights   04/22/17 0819 04/22/17 2044  Weight: 105.2 kg (232 lb) 100 kg (220 lb 8 oz)    History of present illness:  On 04/22/2017  Robert [name] is a 71 y.o. male with a medical history of CVA with left-sided residual weakness, history of DVT with IVC filter placement, hypertension, who presented to the emergency department today with increased tremors. Patient noted to have tremors over the last several months of the right side of his body however today worsened. Patient unable to eat over the last couple of days secondary to his tremor. Tremors have also affected his ambulation. Patient did start a new medication is Zoloft approximately one week ago. Patient currently denies any recent travel, ill contacts. Denies any dysuria or pyuria. Patient's wife notes his urine was very concentrated and dark over the weekend. His chest pain, shortness of breath, abdominal pain, nausea or vomiting, diarrhea, problems with urination, dizziness, headache, visual changes. Patient does endorse constipation.  Hospital Course:  Urinary tract infection -Patient currently afebrile with no leukocytosis -UA: Many bacteria, TNTC WBC, positive nitrites, large  leukocytes -Urine culture multiple species  -Was placed on IV ceftriaxone -Will discharge with keflex  Tremor -Per wife and patient, has been ongoing for several months however worsened this morning. -Suspect worsening of tremor due to urinary tract infection -CT head: Chronic right frontal infarct, no acute abnormality -MRI brain: no acute intracranial abnormality, no finding to explain tremor  -Checked TSH, B12, folate, all WNL -Patient does have appointment with [facility] on 05/12/2017 -Currently with no neurological deficits. Tremor appears to improve with rest and worse with intention -PT consulted recommended HH  History of CVA -With residual left-sided weakness -Continue aspirin, statin  History of DVT -s/p IVC filter placement in 2008  Essential hypertension -Continue losartan  Hyperlipidemia -Continue statin  Depression -Continue Zoloft  Procedures: None  Consultations: None  Discharge Exam: Vitals:   04/23/17 0515 04/23/17 0608  BP: (!) 167/100 136/86  Pulse: 92 75  Resp: 20   Temp: 98.2 F (36.8 C)    Patient feeling better, still has the right sided tremor. Patient denies chest pain, Shortness of breath, abdominal pain, nausea or vomiting, diarrhea or constipation, dizziness or headache.   General: Well developed, well nourished, NAD, appears stated age  29: NCAT,mucous membranes moist.  Cardiovascular: S1 S2 auscultated, no rubs, murmurs or gallops. Regular rate and rhythm.  Respiratory: Clear to auscultation bilaterally with equal chest rise  Abdomen: Soft, nontender, nondistended, + bowel sounds  Extremities: warm dry without cyanosis clubbing or edema  Neuro: AAOx3, nonfocal, left sided weakness, right sided tremor  Skin: Without rashes exudates or nodules  Psych: Normal affect and demeanor with intact judgement and insight, pleasant  Discharge Instructions Discharge Instructions    Discharge instructions    Complete by:  As  directed  Patient will be discharged to home with home healthy physical therapy.  Patient will need to follow up with primary care provider within one week of discharge.  Follow up with neurology for scheduled appointment. Patient should continue medications as prescribed.  Patient should follow a heart healthy diet.     Current Discharge Medication List    START taking these medications   Details  cephALEXin (KEFLEX) 500 MG capsule Take 1 capsule (500 mg total) by mouth 2 (two) times daily. Qty: 12 capsule, Refills: 0      CONTINUE these medications which have NOT CHANGED   Details  aspirin 81 MG tablet Take 81 mg by mouth daily.    losartan (COZAAR) 50 MG tablet Take 50 mg by mouth every morning.    Multiple Vitamin (MULTIVITAMIN) tablet Take 1 tablet by mouth daily.    sertraline (ZOLOFT) 25 MG tablet Take 25 mg by mouth daily.    simvastatin (ZOCOR) 20 MG tablet Take 20 mg by mouth every evening.      STOP taking these medications     HYDROcodone-acetaminophen (NORCO/VICODIN) 5-325 MG tablet        Allergies  Allergen Reactions  . Propofol Other (See Comments)    "hiccups for weeks"   Follow-up Information    [name], MD. Schedule an appointment as soon as possible for a visit in 1 week(s).   Specialty:  Family Medicine Why:  Hospital follow up Contact information: [address] [place] [address] [phone]            The results of significant diagnostics from this hospitalization (including imaging, microbiology, ancillary and laboratory) are listed below for reference.    Significant Diagnostic Studies: Ct Head Wo Contrast  Result Date: 04/22/2017 CLINICAL DATA:  Right arm and leg tremor 2 months with worsening EXAM: CT HEAD WITHOUT CONTRAST TECHNIQUE: Contiguous axial images were obtained from the base of the skull through the vertex without intravenous contrast. COMPARISON:  None. FINDINGS: Brain: Moderately large  chronic infarct in the right frontal lobe. Chronic infarct right basal ganglia. Negative for acute infarct. Negative for hemorrhage or mass. Negative for hydrocephalus. Enlargement of the right lateral ventricle due to volume loss from chronic infarction. Vascular: Negative for hyperdense vessel Skull: Negative Sinuses/Orbits: Negative Other: None IMPRESSION: Chronic right frontal infarct.  No acute abnormality Electronically Signed   By: Franchot Gallo M.D.   On: 04/22/2017 10:28   Mr Brain Wo Contrast  Result Date: 04/22/2017 CLINICAL DATA:  71 y/o M; worsening tremors on the right side of body. EXAM: MRI HEAD WITHOUT CONTRAST TECHNIQUE: Multiplanar, multiecho pulse sequences of the brain and surrounding structures were obtained without intravenous contrast. COMPARISON:  04/14/2017 CT of the head. FINDINGS: Brain: No acute infarction, hemorrhage, hydrocephalus, extra-axial collection or mass lesion. Large chronic right MCA distribution infarction involving lateral frontal lobe and superior temporal lobe as well as the right basal ganglia and insula. Ex vacuo dilatation of the right lateral ventricle. Wallerian degeneration of right cortical spinal tracts extending into the cerebral peduncle and brainstem. Mild hemosiderin staining of the right MCA distribution infarction. Otherwise no significant susceptibility hypointensity in the brain. Vascular: The diminished flow void in the right internal carotid artery in the petrous and cavernous segments. Normal flow void of the right internal carotid artery terminal segment and other central flow voids in the brain. In Skull and upper cervical spine: Normal marrow signal. Sinuses/Orbits: Negative. Other: 22 mm nodule within the left posterior neck  subcutaneous fat probably representing a dermal appendage cyst. IMPRESSION: 1. No acute intracranial abnormality. No finding as explanation for right-sided tremors. 2. Large right MCA distribution chronic infarction. Absent  flow void in the right internal carotid artery petrous and cavernous segments may represent slow flow or occlusion. 3. 2 cm nodule in left posterior neck subcutaneous fat, probably a dermal appendage cysts, direct visualization recommended. Electronically Signed   By: Kristine Garbe M.D.   On: 04/22/2017 23:54    Microbiology: Recent Results (from the past 240 hour(s))  Urine Culture     Status: Abnormal   Collection Time: 04/22/17  9:32 AM  Result Value Ref Range Status   Specimen Description URINE, CLEAN CATCH  Final   Special Requests NONE  Final   Culture MULTIPLE SPECIES PRESENT, SUGGEST RECOLLECTION (A)  Final   Report Status 04/23/2017 FINAL  Final     Labs: Basic Metabolic Panel:  Recent Labs Lab 04/22/17 0930 04/22/17 1912 04/23/17 0525  NA 137  --  137  K 3.7  --  3.8  CL 104  --  104  CO2 25  --  26  GLUCOSE 140*  --  120*  BUN 10  --  12  CREATININE 0.97 1.05 1.03  CALCIUM 9.1  --  9.0   Liver Function Tests:  Recent Labs Lab 04/22/17 0930  AST 20  ALT 20  ALKPHOS 71  BILITOT 1.0  PROT 6.6  ALBUMIN 3.5   No results for input(s): LIPASE, AMYLASE in the last 168 hours. No results for input(s): AMMONIA in the last 168 hours. CBC:  Recent Labs Lab 04/22/17 0930 04/22/17 1912 04/23/17 0525  WBC 6.4 7.7 7.1  HGB 15.1 15.3 14.9  HCT 45.7 45.4 44.4  MCV 88.2 88.5 88.8  PLT 262 279 261   Cardiac Enzymes: No results for input(s): CKTOTAL, CKMB, CKMBINDEX, TROPONINI in the last 168 hours. BNP: BNP (last 3 results) No results for input(s): BNP in the last 8760 hours.  ProBNP (last 3 results) No results for input(s): PROBNP in the last 8760 hours.  CBG: No results for input(s): GLUCAP in the last 168 hours.     SignedCristal Ford  [facility] 04/23/2017, 1:11 PM

## 2017-04-23 NOTE — Progress Notes (Signed)
Patient Discharge: Disposition: Patient discharged to home with wife. Education: Reviewed medications, prescriptions, discharge instructions and follow-up appointments, understood and acknowledged. IV: Discontinued IV before discharge. Telemetry: Discontinued Tele before discharge, CCMD notified. Transportation: Patient escorted out of the unit in w/c. Belongings: Patient took all his belongings with him.

## 2017-04-25 LAB — VITAMIN B1: Vitamin B1 (Thiamine): 116.3 nmol/L (ref 66.5–200.0)

## 2017-04-26 DIAGNOSIS — N390 Urinary tract infection, site not specified: Secondary | ICD-10-CM | POA: Diagnosis not present

## 2017-04-26 DIAGNOSIS — F329 Major depressive disorder, single episode, unspecified: Secondary | ICD-10-CM | POA: Diagnosis not present

## 2017-04-26 DIAGNOSIS — I69354 Hemiplegia and hemiparesis following cerebral infarction affecting left non-dominant side: Secondary | ICD-10-CM | POA: Diagnosis not present

## 2017-04-26 DIAGNOSIS — I10 Essential (primary) hypertension: Secondary | ICD-10-CM | POA: Diagnosis not present

## 2017-04-26 DIAGNOSIS — G250 Essential tremor: Secondary | ICD-10-CM | POA: Diagnosis not present

## 2017-04-26 DIAGNOSIS — Z86718 Personal history of other venous thrombosis and embolism: Secondary | ICD-10-CM | POA: Diagnosis not present

## 2017-04-28 DIAGNOSIS — I10 Essential (primary) hypertension: Secondary | ICD-10-CM | POA: Diagnosis not present

## 2017-04-28 DIAGNOSIS — I69354 Hemiplegia and hemiparesis following cerebral infarction affecting left non-dominant side: Secondary | ICD-10-CM | POA: Diagnosis not present

## 2017-04-28 DIAGNOSIS — F329 Major depressive disorder, single episode, unspecified: Secondary | ICD-10-CM | POA: Diagnosis not present

## 2017-04-28 DIAGNOSIS — Z86718 Personal history of other venous thrombosis and embolism: Secondary | ICD-10-CM | POA: Diagnosis not present

## 2017-04-28 DIAGNOSIS — G250 Essential tremor: Secondary | ICD-10-CM | POA: Diagnosis not present

## 2017-04-28 DIAGNOSIS — N390 Urinary tract infection, site not specified: Secondary | ICD-10-CM | POA: Diagnosis not present

## 2017-04-30 DIAGNOSIS — N390 Urinary tract infection, site not specified: Secondary | ICD-10-CM | POA: Diagnosis not present

## 2017-04-30 DIAGNOSIS — G250 Essential tremor: Secondary | ICD-10-CM | POA: Diagnosis not present

## 2017-04-30 DIAGNOSIS — F329 Major depressive disorder, single episode, unspecified: Secondary | ICD-10-CM | POA: Diagnosis not present

## 2017-04-30 DIAGNOSIS — I10 Essential (primary) hypertension: Secondary | ICD-10-CM | POA: Diagnosis not present

## 2017-04-30 DIAGNOSIS — Z86718 Personal history of other venous thrombosis and embolism: Secondary | ICD-10-CM | POA: Diagnosis not present

## 2017-04-30 DIAGNOSIS — I69354 Hemiplegia and hemiparesis following cerebral infarction affecting left non-dominant side: Secondary | ICD-10-CM | POA: Diagnosis not present

## 2017-05-03 DIAGNOSIS — Z86718 Personal history of other venous thrombosis and embolism: Secondary | ICD-10-CM | POA: Diagnosis not present

## 2017-05-03 DIAGNOSIS — I69354 Hemiplegia and hemiparesis following cerebral infarction affecting left non-dominant side: Secondary | ICD-10-CM | POA: Diagnosis not present

## 2017-05-03 DIAGNOSIS — F329 Major depressive disorder, single episode, unspecified: Secondary | ICD-10-CM | POA: Diagnosis not present

## 2017-05-03 DIAGNOSIS — N390 Urinary tract infection, site not specified: Secondary | ICD-10-CM | POA: Diagnosis not present

## 2017-05-03 DIAGNOSIS — I10 Essential (primary) hypertension: Secondary | ICD-10-CM | POA: Diagnosis not present

## 2017-05-03 DIAGNOSIS — G250 Essential tremor: Secondary | ICD-10-CM | POA: Diagnosis not present

## 2017-05-05 DIAGNOSIS — I10 Essential (primary) hypertension: Secondary | ICD-10-CM | POA: Diagnosis not present

## 2017-05-05 DIAGNOSIS — Z86718 Personal history of other venous thrombosis and embolism: Secondary | ICD-10-CM | POA: Diagnosis not present

## 2017-05-05 DIAGNOSIS — N390 Urinary tract infection, site not specified: Secondary | ICD-10-CM | POA: Diagnosis not present

## 2017-05-05 DIAGNOSIS — G250 Essential tremor: Secondary | ICD-10-CM | POA: Diagnosis not present

## 2017-05-05 DIAGNOSIS — F329 Major depressive disorder, single episode, unspecified: Secondary | ICD-10-CM | POA: Diagnosis not present

## 2017-05-05 DIAGNOSIS — I69354 Hemiplegia and hemiparesis following cerebral infarction affecting left non-dominant side: Secondary | ICD-10-CM | POA: Diagnosis not present

## 2017-05-08 ENCOUNTER — Encounter (HOSPITAL_COMMUNITY): Payer: Self-pay | Admitting: Emergency Medicine

## 2017-05-08 ENCOUNTER — Emergency Department (HOSPITAL_COMMUNITY): Payer: Medicare Other

## 2017-05-08 ENCOUNTER — Emergency Department (HOSPITAL_COMMUNITY)
Admission: EM | Admit: 2017-05-08 | Discharge: 2017-05-08 | Disposition: A | Discharge status: Home / Self Care | Payer: Medicare Other | Attending: Physician Assistant | Admitting: Physician Assistant

## 2017-05-08 DIAGNOSIS — R531 Weakness: Secondary | ICD-10-CM | POA: Insufficient documentation

## 2017-05-08 DIAGNOSIS — I6789 Other cerebrovascular disease: Secondary | ICD-10-CM | POA: Diagnosis not present

## 2017-05-08 DIAGNOSIS — Z8673 Personal history of transient ischemic attack (TIA), and cerebral infarction without residual deficits: Secondary | ICD-10-CM | POA: Insufficient documentation

## 2017-05-08 DIAGNOSIS — Z79899 Other long term (current) drug therapy: Secondary | ICD-10-CM | POA: Insufficient documentation

## 2017-05-08 DIAGNOSIS — Z87891 Personal history of nicotine dependence: Secondary | ICD-10-CM | POA: Diagnosis not present

## 2017-05-08 DIAGNOSIS — I10 Essential (primary) hypertension: Secondary | ICD-10-CM | POA: Insufficient documentation

## 2017-05-08 DIAGNOSIS — Z7982 Long term (current) use of aspirin: Secondary | ICD-10-CM | POA: Diagnosis not present

## 2017-05-08 DIAGNOSIS — K5730 Diverticulosis of large intestine without perforation or abscess without bleeding: Secondary | ICD-10-CM | POA: Diagnosis not present

## 2017-05-08 DIAGNOSIS — K5901 Slow transit constipation: Secondary | ICD-10-CM

## 2017-05-08 DIAGNOSIS — R251 Tremor, unspecified: Secondary | ICD-10-CM | POA: Diagnosis not present

## 2017-05-08 DIAGNOSIS — Z7409 Other reduced mobility: Secondary | ICD-10-CM | POA: Diagnosis not present

## 2017-05-08 DIAGNOSIS — K5900 Constipation, unspecified: Secondary | ICD-10-CM | POA: Diagnosis not present

## 2017-05-08 LAB — COMPREHENSIVE METABOLIC PANEL
ALT: 31 U/L (ref 17–63)
AST: 24 U/L (ref 15–41)
Albumin: 3.6 g/dL (ref 3.5–5.0)
Alkaline Phosphatase: 69 U/L (ref 38–126)
Anion gap: 7 (ref 5–15)
BUN: 13 mg/dL (ref 6–20)
CO2: 28 mmol/L (ref 22–32)
Calcium: 9.3 mg/dL (ref 8.9–10.3)
Chloride: 105 mmol/L (ref 101–111)
Creatinine, Ser: 1.05 mg/dL (ref 0.61–1.24)
GFR calc Af Amer: >60 mL/min (ref >60)
GFR calc non Af Amer: >60 mL/min (ref >60)
Glucose, Bld: 145 mg/dL — ABNORMAL HIGH (ref 65–99)
Potassium: 3.9 mmol/L (ref 3.5–5.1)
Sodium: 140 mmol/L (ref 135–145)
Total Bilirubin: 0.6 mg/dL (ref 0.3–1.2)
Total Protein: 7.1 g/dL (ref 6.5–8.1)

## 2017-05-08 LAB — I-STAT TROPONIN, ED: Troponin i, poc: 0.01 ng/mL (ref 0.00–0.08)

## 2017-05-08 LAB — URINALYSIS, ROUTINE W REFLEX MICROSCOPIC
Bilirubin Urine: NEGATIVE
Glucose, UA: 50 mg/dL — AB
Ketones, ur: NEGATIVE mg/dL
Nitrite: NEGATIVE
Protein, ur: NEGATIVE mg/dL
Specific Gravity, Urine: 1.02 (ref 1.005–1.030)
pH: 5 (ref 5.0–8.0)

## 2017-05-08 LAB — CBC WITH DIFFERENTIAL/PLATELET
Basophils Absolute: 0 10*3/uL (ref 0.0–0.1)
Basophils Relative: 0 %
Eosinophils Absolute: 0.1 10*3/uL (ref 0.0–0.7)
Eosinophils Relative: 2 %
HCT: 43.9 % (ref 39.0–52.0)
Hemoglobin: 15 g/dL (ref 13.0–17.0)
Lymphocytes Relative: 20 %
Lymphs Abs: 1.5 10*3/uL (ref 0.7–4.0)
MCH: 29.8 pg (ref 26.0–34.0)
MCHC: 34.2 g/dL (ref 30.0–36.0)
MCV: 87.3 fL (ref 78.0–100.0)
Monocytes Absolute: 1 10*3/uL (ref 0.1–1.0)
Monocytes Relative: 14 %
Neutro Abs: 4.7 10*3/uL (ref 1.7–7.7)
Neutrophils Relative %: 64 %
Platelets: 284 10*3/uL (ref 150–400)
RBC: 5.03 MIL/uL (ref 4.22–5.81)
RDW: 13.4 % (ref 11.5–15.5)
WBC: 7.4 10*3/uL (ref 4.0–10.5)

## 2017-05-08 LAB — ETHANOL: Alcohol, Ethyl (B): <5 mg/dL (ref <5)

## 2017-05-08 LAB — PROTIME-INR
INR: 1.14
Prothrombin Time: 14.7 seconds (ref 11.4–15.2)

## 2017-05-08 LAB — I-STAT CG4 LACTIC ACID, ED: Lactic Acid, Venous: 1.4 mmol/L (ref 0.5–1.9)

## 2017-05-08 MED ORDER — SODIUM CHLORIDE 0.9 % IV SOLN
Freq: Once | INTRAVENOUS | Status: AC
  Administered 2017-05-08: 16:00:00 via INTRAVENOUS

## 2017-05-08 MED ORDER — IOPAMIDOL (ISOVUE-300) INJECTION 61%
INTRAVENOUS | Status: AC
  Filled 2017-05-08: qty 100

## 2017-05-08 MED ORDER — IOPAMIDOL (ISOVUE-300) INJECTION 61%
100.0000 mL | Freq: Once | INTRAVENOUS | Status: AC | PRN
  Administered 2017-05-08: 100 mL via INTRAVENOUS

## 2017-05-08 MED ORDER — CEPHALEXIN 500 MG PO CAPS: 500.0000 mg | ORAL_CAPSULE | Freq: Four times a day (QID) | ORAL | 0 refills | Status: DC

## 2017-05-08 MED ORDER — LORAZEPAM 0.5 MG PO TABS
0.5000 mg | ORAL_TABLET | Freq: Once | ORAL | Status: AC
  Administered 2017-05-08: 0.5 mg via ORAL
  Filled 2017-05-08: qty 1

## 2017-05-08 MED ORDER — POLYETHYLENE GLYCOL 3350 17 G PO PACK: 17.0000 g | PACK | Freq: Every day | ORAL | 0 refills | Status: DC

## 2017-05-08 NOTE — ED Provider Notes (Signed)
Took over care from Dr. Johnney Killian. In short patient is a 71 year old male presenting with worsening tremors. CT and labs show evidence of constipation, UTI. I was discharging patient when wife expressed concerned about living situation because he is on the second floor and has trouble moving. Patient is very anxious about climbing the stairs because he is worried he is going to  fall.We'll contact [facility] to help deliver patient home. An additional Will put in case management consult for help with these mobility issues as an outpatient.   Macarthur Critchley, MD 05/08/17 1746

## 2017-05-08 NOTE — ED Provider Notes (Signed)
[facility] DEPT Provider Note   CSN: [id number] Arrival date & time: 05/08/17  1225     History   Chief Complaint Chief Complaint  Patient presents with  . Weakness  . Tremors    HPI Robert [name] is a 71 y.o. male.  HPI Patient has prior history of left-sided stroke. He does ambulate with a lower leg brace. Left upper extremity as significant weakness and dysfunction. Right side is the patient's normal side. Over the course of a number of months now the patient has been getting tremor in his right upper extremity and lower extremity. This waxes and wanes in severity. Although over the past number of weeks it has been getting progressively worse. Patient's wife reports that it got a lot worse prior to him being diagnosed with a urinary tract infection being admitted to the hospital. It was better at discharge. Now for a couple of days it has been increasing. Review systems is grossly negative. The patient's wife does report he said constipation. She reports last bowel movement about 10 days ago. Patient denies he has any abdominal pain. He denies chest pain. His wife reports sometimes he gets some swelling in the ankles but if that happens she elevates his legs. She reports there is no swelling now and no wounds or redness to the extremities. Past Medical History:  Diagnosis Date  . Frequency of urination   . GERD (gastroesophageal reflux disease)   . Gross hematuria   . History of acute pyelonephritis    10-13-2012  . History of CVA with residual deficit    2008--  left side of body weakness and foot drop (wears leg brace and uses cane)  . History of DVT of lower extremity    2008--  cva  . Hyperlipidemia   . Hypertension   . 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 Urethral stricture   . Urgency of urination   . Weakness of left side of body    secondary to cva 2008  . Wears glasses   . Wears hearing aid     bilateral-- wears intermittantly    Patient Active Problem List   Diagnosis Date Noted  . UTI (urinary tract infection) 04/22/2017  . Acute lower UTI 04/22/2017  . Tremor 04/22/2017  . Acute pyelonephritis 09/29/2012  . Nausea 09/29/2012  . Fever 09/29/2012  . HTN (hypertension) 09/29/2012  . H/O: CVA (cerebrovascular accident) 09/29/2012  . Hearing loss 09/29/2012    Past Surgical History:  Procedure Laterality Date  . CYSTOSCOPY WITH RETROGRADE URETHROGRAM N/A 10/23/2015   Procedure: CYSTOSCOPY WITH RETROGRADE URETHROGRAM;  Surgeon: [name], MD;  Location[facility];  Service: Urology;  Laterality: N/A;  . CYSTOSCOPY WITH URETHRAL DILATATION N/A 10/23/2015   Procedure: CYSTOSCOPY WITH URETHRAL BALLOON DILATATION;  Surgeon: [name], MD;  Location: [facility];  Service: Urology;  Laterality: N/A;  BALLOON DILATION   . IVC FILTER PLACEMENT (Abie HX)  2008       Home Medications    Prior to Admission medications   Medication Sig Start Date End Date Taking? Authorizing Provider  aspirin 81 MG tablet Take 81 mg by mouth daily.   Yes [provider]  losartan (COZAAR) 50 MG tablet Take 50 mg by mouth every morning.   Yes [provider]  Multiple Vitamin (MULTIVITAMIN) tablet Take 1 tablet by mouth daily.  Yes [provider]  senna (SENOKOT) 8.6 MG TABS tablet Take 2 tablets by mouth daily.   Yes [provider]  sertraline (ZOLOFT) 25 MG tablet Take 25 mg by mouth daily.   Yes [provider]  simvastatin (ZOCOR) 20 MG tablet Take 20 mg by mouth every evening.   Yes [provider]    Family History Family History  Problem Relation Age of Onset  . Diabetes Sister   . Lung cancer Brother        Post 9/11 voluntary work in [place] History  Substance Use Topics  . Smoking status: Former Smoker    Years: 10.00    Types: Cigarettes    Quit date:  10/26/1970  . Smokeless tobacco: Never Used  . Alcohol use No     Allergies   Propofol   Review of Systems Review of Systems 10 Systems reviewed and are negative for acute change except as noted in the HPI.   Physical Exam Updated Vital Signs BP 123/73 (BP Location: Right Arm)   Pulse (!) 112   Temp 98.4 F (36.9 C) (Oral)   Resp 16   SpO2 94%   Physical Exam  Constitutional:  Patient is alert without respiratory distress.  HENT:  Head: Normocephalic and atraumatic.  Nose: Nose normal.  Mouth/Throat: Oropharynx is clear and moist.  Facial skin seems just slightly erythematous and shiny.  Eyes: Pupils are equal, round, and reactive to light. EOM are normal.  Neck: Neck supple.  Cardiovascular: Normal rate, regular rhythm, normal heart sounds and intact distal pulses.   Pulmonary/Chest: Effort normal and breath sounds normal.  Abdominal: Soft. He exhibits no distension and no mass. There is no tenderness. There is no guarding.  Musculoskeletal:  Lower extremities are Condition. Patient does not have any wounds or sores or appearance of cellulitis. Significant edema. Upper extremity has a flexion contracture developing of the left hand. General condition of the skin is good. Visual inspection of the back is normal.  Neurological:  Patient is awake and alert. He does interact verbally. I would qualify his affect is being slightly flat and mildly delayed in verbal responses. He exhibits a tremor of the upper and lower extremities on the right (his non-stroke side). The degree of tremor waxes and wanes during the course of exam. As I said examining his lower legs with my hand in constant direct contact with the extremity, the tremor seems to extinguish.  Skin: Skin is warm and dry.  Psychiatric:  Affect is slightly flat.     ED Treatments / Results  Labs (all labs ordered are listed, but only abnormal results are displayed) Labs Reviewed  URINALYSIS, ROUTINE W REFLEX  MICROSCOPIC  COMPREHENSIVE METABOLIC PANEL  ETHANOL  CBC WITH DIFFERENTIAL/PLATELET  PROTIME-INR  I-STAT CG4 LACTIC ACID, ED  I-STAT TROPOININ, ED    EKG  EKG Interpretation None       Radiology No results found.  Procedures Procedures (including critical care time)  Medications Ordered in ED Medications - No data to display   Initial Impression / Assessment and Plan / ED Course  I have reviewed the triage vital signs and the nursing notes.  Pertinent labs & imaging results that were available during my care of the patient were reviewed by me and considered in my medical decision making (see chart for details).     Final Clinical Impressions(s) / ED Diagnoses   Final diagnoses:  Tremor H/O CVA Constipation.  Dr. Thomasene Lot will review results of CT abdomen. Patient has had pre-existing tremors which worsen at times. Sometimes this has been associated with Interceedent medical illness. Pending completion of workup patient may be stable for discharge home and outpatient follow-up is planned for increasing frequency of unilateral tremors. New Prescriptions New Prescriptions   No medications on file     Charlesetta Shanks, MD 05/12/17 2055

## 2017-05-08 NOTE — ED Notes (Signed)
Patient transported to CT 

## 2017-05-08 NOTE — ED Notes (Signed)
Bed: WA11 Expected date:  Expected time:  Means of arrival:  Comments: 

## 2017-05-08 NOTE — Discharge Instructions (Signed)
Your urine was sent for culture. We are going to start treatment now. Please follow up with your primary care physician to follow up with results of the culture. If you do indeed have a urinary tract infection and this will be the second one which is very atypical and we will have you  follow-up with the urologist given.

## 2017-05-08 NOTE — ED Triage Notes (Signed)
Per EMS. Pt from home. Pt has had weakness and R side tremors since he finished his abx for his UTI on Thursday. These symptoms got better while on abx.

## 2017-05-09 ENCOUNTER — Telehealth: Payer: Self-pay | Admitting: *Deleted

## 2017-05-09 NOTE — Telephone Encounter (Signed)
Pt had been seen in the [facility] on 05/08/2017 for increased tremulousness and Fear of falling. Hx CVA LSW. CM spoke with doris, his wife by phone on Sunday 05/09/2017 @ 0900 who tells me they have no needs as he is active with [facility] for HHPT and has Kasandra Knudsen which he uses for mobility. Scheduled to see Neurologist 05/12/2017 for Tremors and increased anxiety over falls. Due to move from 2nd floor in August as well. Neuro to authorize Ambulance transport to and from home as pt unable to navigate stairs??? No services available that CM can provide/increase at this time.

## 2017-05-10 LAB — URINE CULTURE: Culture: <10000 — AB

## 2017-05-11 DIAGNOSIS — N390 Urinary tract infection, site not specified: Secondary | ICD-10-CM | POA: Diagnosis not present

## 2017-05-11 DIAGNOSIS — Z86718 Personal history of other venous thrombosis and embolism: Secondary | ICD-10-CM | POA: Diagnosis not present

## 2017-05-11 DIAGNOSIS — I69354 Hemiplegia and hemiparesis following cerebral infarction affecting left non-dominant side: Secondary | ICD-10-CM | POA: Diagnosis not present

## 2017-05-11 DIAGNOSIS — F329 Major depressive disorder, single episode, unspecified: Secondary | ICD-10-CM | POA: Diagnosis not present

## 2017-05-11 DIAGNOSIS — I10 Essential (primary) hypertension: Secondary | ICD-10-CM | POA: Diagnosis not present

## 2017-05-11 DIAGNOSIS — G250 Essential tremor: Secondary | ICD-10-CM | POA: Diagnosis not present

## 2017-05-12 ENCOUNTER — Encounter: Payer: Self-pay | Admitting: Neurology

## 2017-05-12 ENCOUNTER — Ambulatory Visit (INDEPENDENT_AMBULATORY_CARE_PROVIDER_SITE_OTHER): Payer: Medicare Other | Admitting: Neurology

## 2017-05-12 VITALS — BP 110/66 | HR 86

## 2017-05-12 DIAGNOSIS — G20 Parkinson's disease: Secondary | ICD-10-CM

## 2017-05-12 DIAGNOSIS — G464 Cerebellar stroke syndrome: Secondary | ICD-10-CM | POA: Diagnosis not present

## 2017-05-12 DIAGNOSIS — I69354 Hemiplegia and hemiparesis following cerebral infarction affecting left non-dominant side: Secondary | ICD-10-CM

## 2017-05-12 DIAGNOSIS — R4182 Altered mental status, unspecified: Secondary | ICD-10-CM | POA: Diagnosis not present

## 2017-05-12 MED ORDER — CARBIDOPA-LEVODOPA 25-100 MG PO TABS: ORAL_TABLET | ORAL | 5 refills | Status: DC

## 2017-05-12 NOTE — Patient Instructions (Signed)
I think you have signs and symptoms of parkinson's like disease, called parkinsonism. 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 This can cause you to have tremors, affect your balance, your memory, your mood, your bowel and bladder function, your posture, balance and walking and your activities of daily living.  I do want to suggest a few things today:  Remember to drink plenty of fluid at least 6 glasses (8 oz each), eat healthy meals and do not skip any meals. Try to eat protein with a every meal and eat a healthy snack such as fruit or nuts in between meals. Try to keep a regular sleep-wake schedule and try to exercise daily, particularly in the form of walking, work with PT. Avoid stairs for now, you are at risk for falls.   Try to stay active physically and mentally. Engage in social activities in your community and with your family and try to keep up with current events by reading the newspaper or watching the news. Try to do word puzzles and you may like to do puzzles and brain games on the computer such as on [email or web address]/.   As far as your medications are concerned, I would like to start you on Sinemet (generic name: carbidopa-levodopa) 25/100 mg: Take half a pill twice daily (8 AM and noon) for one week, then half a pill 3 times a day (8 AM, noon, and 4 PM) for one week, then one pill 3 times a day thereafter. Please try to take the medication away from you mealtimes, that is, ideally either one hour before or 2 hours after your meal to ensure optimal absorption. The medication can interfere with the protein content of your meal and trying to the protein in your food and therefore not get fully absorbed.  Common side effects reported are: Nausea, vomiting, sedation, confusion, lightheadedness. Rare side effects include hallucinations, severe nausea or vomiting, diarrhea and significant drop in blood pressure especially when going from lying to standing or from sitting to standing.   As far as  diagnostic testing, you have had appropriate labs and scans recently.   I would like to see you back in 3 months, sooner if we need to. Please call us with any interim questions, concerns, problems, updates or refill requests.  Our phone number is [phone]. We also have an after hours call service for urgent matters and there is a physician on-call for urgent questions, that cannot wait till the next work day. For any emergencies you know to call 911 or go to the nearest emergency room.   You can email me through my chart and also leave a phone message for Beverlee Nims, my nurse.

## 2017-05-12 NOTE — Progress Notes (Signed)
Subjective:    Patient ID: Robert [name] is a 71 y.o. male.  HPI     Star Age, MD, PhD [facility] [address] [place], [place] [address]  Dear Dr. Orland Mustard,   I saw your patient, Robert [name], upon your kind request in my neurologic clinic today for initial consultation of his tremors. The patient is accompanied by his wife and daughter-in-law today. 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 I reviewed his hospital records including discharge summary and test results. He had a head CT without contrast on 04/22/2017 which I reviewed:IMPRESSION: Chronic right frontal infarct.  No acute abnormality  He had a brain MRI without contrast on 04/22/2017 which I reviewed: IMPRESSION: 1. No acute intracranial abnormality. No finding as explanation for right-sided tremors. 2. Large right MCA distribution chronic infarction. Absent flow void in the right internal carotid artery petrous and cavernous segments may represent slow flow or occlusion. 3. 2 cm nodule in left posterior neck subcutaneous fat, probably a dermal appendage cysts, direct visualization recommended. Laboratory test results included TSH, B12, folate, within normal limits. He was discharged to home with home health therapy. He was discharged on his current medications are baby aspirin, losartan, multivitamin, sertraline, simvastatin. He was advised to stop taking hydrocodone. For his UTI, he was treated in the hospital with IV  ceftriaxone and discharged on Keflex. He was noted to have a right-sided tremor during the hospital stay. He presented to the emergency room on 05/08/2017 with weakness and increase in tremor, he had severe constipation and had not had a bowel movement in 10 days. I reviewed the emergency room records. Labs were concerning for UTI and CT abdomen showed evidence of constipation.  He was started on another Abx. He sees Dr. Risa Grill in urology, had a urological procedure about a year or so ago.  I reviewed your office note from 03/17/17, which you kindly included. He was started on Sertraline low dose at the time. This was recently increased a couple of days ago to 50 mg once daily. His anxiety is indeed better. He is getting home health physical therapy and is in the process of moving to the main level for his bedroom as opposed upstairs as he is not able to manage stairs at this time. He is not aware of any family history of Parkinson's disease but does not know much about his family history, mother died young, was killed, does not know much about father's medical history. No obvious family history of Parkinson's disease in his siblings. He has not fallen recently but is not mobilizing very much, he was able to use his cane and with the brace on the left leg was able to walk a little bit, since the tremor has increased he has not been able to go for walks with his wife. He is retired. He worked for [employer] for 25 years. He does not drink caffeine on a daily basis. He has had some trouble feeding himself with the right hand since the tremor has become worse.  His Past Medical History Is Significant For: Past Medical History:  Diagnosis Date  . CVA (cerebral vascular accident) ([place])    with left sided hemiparesis  . Diverticulosis   . Frequency of urination   . GERD (gastroesophageal reflux disease)   . Gross hematuria   . History of acute pyelonephritis    10-13-2012  . History of CVA with residual deficit     2008--  left side of body weakness and foot drop (wears leg brace and uses cane)  . History of DVT of lower extremity    2008--  cva  . Hypercholesteremia   . Hyperlipidemia   . Hyperlipidemia   . Hypertension   . 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 Left leg DVT ([place])   . 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 Urethral stricture   . Urgency of urination   . Weakness of left side of body    secondary to cva 2008  . Wears glasses   . Wears hearing aid    bilateral-- wears intermittantly    His Past Surgical History Is Significant For: Past Surgical History:  Procedure Laterality Date  . CYSTOSCOPY WITH RETROGRADE URETHROGRAM N/A 10/23/2015   Procedure: CYSTOSCOPY WITH RETROGRADE URETHROGRAM;  Surgeon: [name], MD;  [facility];  Service: Urology;  Laterality: N/A;  . CYSTOSCOPY WITH URETHRAL DILATATION N/A 10/23/2015   Procedure: CYSTOSCOPY WITH URETHRAL BALLOON DILATATION;  Surgeon: [name], MD;  Location[facility];  Service: Urology;  Laterality: N/A;  BALLOON DILATION   . IVC FILTER PLACEMENT ([place] [facility])  2008    His Family History Is Significant For: Family History  Problem Relation Age of Onset  . Diabetes Sister   . Lung cancer Brother        Post 9/11 voluntary work in [employer]    His Social History Is Significant For: Social History   Social History  . Marital status: Married    Spouse name: N/A  . Number of children: N/A  . Years of education: N/A   Social History Main Topics  . Smoking status: Former Smoker    Years: 10.00    Types: Cigarettes    Quit date: 10/26/1970  . Smokeless tobacco: Never Used  . Alcohol use No  . Drug use: No  . Sexual activity: Not Asked   Other Topics Concern  . None   Social History Narrative   Lives in [place] with wife. He is a English as a second language teacher.   Has one son who lives in [place]. He is healthy.   [facility] [place],  [place].    His Allergies Are:  Allergies  Allergen Reactions  . Propofol Other (See Comments)    "hiccups for weeks"  :   His Current Medications Are:  Outpatient Encounter Prescriptions as of 05/12/2017  Medication Sig  . aspirin 81 MG tablet Take 81 mg by mouth daily.  . cephALEXin (KEFLEX) 500 MG capsule Take 1 capsule (500 mg total) by mouth 4 (four) times daily.  Marland Kitchen losartan (COZAAR) 50 MG tablet Take 50 mg by mouth every morning.  . Multiple Vitamin (MULTIVITAMIN) tablet Take 1 tablet by mouth daily.  . polyethylene glycol (MIRALAX) packet Take 17 g by mouth daily.  Marland Kitchen senna (SENOKOT) 8.6 MG TABS tablet Take 2 tablets by mouth daily.  . sertraline (ZOLOFT) 25 MG tablet Take 50 mg by mouth daily.   . simvastatin (ZOCOR) 20 MG tablet Take 20 mg  by mouth every evening.   No facility-administered encounter medications on file as of 05/12/2017.   :   Review of Systems:  Out of a complete 14 point review of systems, all are reviewed and negative with the exception of these symptoms as listed below:  Review of Systems  Neurological:       Pt presents today to discuss his tremor. Pt had a stroke in 2008 an a slight tremor in his right hand was noticed. However, in the past 3 months, the tremor in his right and and leg have worsened. Pt is right hand dominant. 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    Objective:  Neurological Exam  Physical Exam Physical Examination:   Vitals:   05/12/17 1422  BP: 110/66  Pulse: 86   General Examination: The patient is a very pleasant 71 y.o. male in no acute distress. He appears well-developed and well-nourished and well groomed.   HEENT: Normocephalic, atraumatic, pupils are equal, round and reactive to light and accommodation. Extraocular tracking shows mild saccadic breakdown, no nystagmus. Voice is mildly hypophonic, no significant dysarthria as noted, he does have mild left lower face weakness. Hearing is grossly intact are mildly impaired, he does  have a left hearing aid in place. Airway examination reveals mild mouth dryness, tongue protrudes centrally and palate elevates symmetrically.  Neck is very mildly rigid, has good range of motion. He has a very mild/slight intermittent lower lip tremor.    Chest: Clear to auscultation without wheezing, rhonchi or crackles noted.  Heart: S1+S2+0, regular and normal without murmurs, rubs or gallops noted.   Abdomen: Soft, non-tender and non-distended with normal bowel sounds appreciated on auscultation.  Extremities: There is trace pitting edema in the distal lower extremities bilaterally. Left AFO in place.  Skin: Warm and dry without trophic changes noted.   Musculoskeletal: exam reveals no obvious joint deformities, tenderness or joint swelling or erythema.   Neurologically:  Mental status: The patient is awake, alert and oriented in all 4 spheres. His immediate and remote memory, attention, language skills and fund of knowledge are appropriate. There is no evidence of aphasia, agnosia, apraxia or anomia. Speech is clear with normal prosody and enunciation. Thought process is linear. Mood is normal and affect is normal.  Cranial nerves II - XII are as described above under HEENT exam. In addition: shoulder shrug is normal with equal shoulder height noted. Motor exam: Normal bulk, strength  of 4+ out of 5 on the right, mild increase in tone in the right upper extremity with cogwheeling noted, left upper and lower extremity spasticity noted and hyperreflexia on the left, 3 out of 5 strength proximally in the left upper extremity, otherwise dense weakness in the left upper extremity, 3 out of 5 strength distally on the left side, significant proximal weakness in the left lower extremity. He has no tremor on the left. Fine motor skills are not possible to test on the left, Archimedes spiral drawing is not possible with the left. On the right, he has a coarsened significant tremor with Archimedes  spiral drawing, handwriting is tremulous, not particularly micrographic, legible. He has a significant resting tremor in the right upper extremity and right lower extremity. He has a postural tremor in the right upper extremity as well. Fine motor skills on the right are moderately impaired. Romberg is not testable. He's not able to stand or walk for me.   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 While he does have a stable appearing left hemiplegia with spasticity on the left, his history and physical exam are concerning for right-sided parkinsonism. 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 He had extensive testing recently in June and we talked about blood test results and brain MRI and CT head findings. I suggested no further diagnostic tests at this time but talk to the patient and his family about parkinsonism and Parkinson's disease, the prognosis and treatment options.  We talked about medical treatments and non-pharmacological approaches. We talked about maintaining a healthy lifestyle in general. I encouraged the patient to continue to work with home health physical therapy, I do agree with the idea that he should move to the main level in the house. He is advised to avoid climbing stairs at this time, he is at fall risk.  As far as medications are concerned, I recommended the following at this time: Start Sinemet low dose with gradual increase, I provided instructions in writing and a new prescription  for this, we will start with 25-100 milligrams strength half a pill twice a day and gradually increase to one whole pill 3 times a day. I would like to see him back in about 3 months, sooner as needed. I answered all their questions today and the patient and his family were in agreement.  Thank you very much for allowing me to participate in the care of this nice patient. If I can be of any further assistance to you please do not hesitate to call me at [phone].  Sincerely,   Star Age, MD, PhD

## 2017-05-13 DIAGNOSIS — N390 Urinary tract infection, site not specified: Secondary | ICD-10-CM | POA: Diagnosis not present

## 2017-05-13 DIAGNOSIS — Z86718 Personal history of other venous thrombosis and embolism: Secondary | ICD-10-CM | POA: Diagnosis not present

## 2017-05-13 DIAGNOSIS — G250 Essential tremor: Secondary | ICD-10-CM | POA: Diagnosis not present

## 2017-05-13 DIAGNOSIS — F329 Major depressive disorder, single episode, unspecified: Secondary | ICD-10-CM | POA: Diagnosis not present

## 2017-05-13 DIAGNOSIS — I10 Essential (primary) hypertension: Secondary | ICD-10-CM | POA: Diagnosis not present

## 2017-05-13 DIAGNOSIS — I69354 Hemiplegia and hemiparesis following cerebral infarction affecting left non-dominant side: Secondary | ICD-10-CM | POA: Diagnosis not present

## 2017-05-17 DIAGNOSIS — N390 Urinary tract infection, site not specified: Secondary | ICD-10-CM | POA: Diagnosis not present

## 2017-05-17 DIAGNOSIS — Z86718 Personal history of other venous thrombosis and embolism: Secondary | ICD-10-CM | POA: Diagnosis not present

## 2017-05-17 DIAGNOSIS — I69354 Hemiplegia and hemiparesis following cerebral infarction affecting left non-dominant side: Secondary | ICD-10-CM | POA: Diagnosis not present

## 2017-05-17 DIAGNOSIS — F329 Major depressive disorder, single episode, unspecified: Secondary | ICD-10-CM | POA: Diagnosis not present

## 2017-05-17 DIAGNOSIS — G250 Essential tremor: Secondary | ICD-10-CM | POA: Diagnosis not present

## 2017-05-17 DIAGNOSIS — I10 Essential (primary) hypertension: Secondary | ICD-10-CM | POA: Diagnosis not present

## 2017-05-18 ENCOUNTER — Telehealth: Payer: Self-pay | Admitting: Neurology

## 2017-05-18 NOTE — Telephone Encounter (Signed)
Please reiterate the following to patient or wife: change positions slowly and may start using OTC compression socks (knee highs) as his BP was on the lower end of normal during office visit. Turn slowly, no bending down to pick anything, no heavy lifting, be extra careful at night and first thing in the morning. Also, be careful in the Bathroom and the kitchen. Make enough light if you have to get up at night.

## 2017-05-18 NOTE — Telephone Encounter (Signed)
Pt's wife called said he is experiencing lightheadedness since starting carbidopa-levodopa (SINEMET IR) 25-100 MG tablet . Is this normal and if so how long will it last.

## 2017-05-18 NOTE — Telephone Encounter (Signed)
I called pt, spoke to pt's wife, [name], per [place]. I advised her of Dr. Guadelupe Sabin recommendations. Pt's wife will try the compression stockings with the pt as well as all of the other recommendations. Pt's wife verbalized understanding of the advice and will call us back for further questions or concerns.

## 2017-05-18 NOTE — Telephone Encounter (Signed)
I called pt, spoke to pt's wife [name], per DPR. She reports that the pt is taking 1/2 tablet of sinemet BID, should start taking the 1/2 tablet TID tomorrow. Pt is drinking "plenty" of water and taking his sinemet an hour before meals. Pt is experiencing occasional lightheadedness after starting the sinemet. I advised pt's wife that lightheadedness is a common side effect of sinemet, and encouraged her to keep making sure the pt is hydrating well and taking the sinemet away from meals. I advised her that I will inform [name] of this and find out if she recommends anything else for the pt. Pt's wife verbalized understanding.

## 2017-05-19 DIAGNOSIS — N390 Urinary tract infection, site not specified: Secondary | ICD-10-CM | POA: Diagnosis not present

## 2017-05-19 DIAGNOSIS — Z86718 Personal history of other venous thrombosis and embolism: Secondary | ICD-10-CM | POA: Diagnosis not present

## 2017-05-19 DIAGNOSIS — I69354 Hemiplegia and hemiparesis following cerebral infarction affecting left non-dominant side: Secondary | ICD-10-CM | POA: Diagnosis not present

## 2017-05-19 DIAGNOSIS — I10 Essential (primary) hypertension: Secondary | ICD-10-CM | POA: Diagnosis not present

## 2017-05-19 DIAGNOSIS — F329 Major depressive disorder, single episode, unspecified: Secondary | ICD-10-CM | POA: Diagnosis not present

## 2017-05-19 DIAGNOSIS — G250 Essential tremor: Secondary | ICD-10-CM | POA: Diagnosis not present

## 2017-05-25 ENCOUNTER — Ambulatory Visit: Payer: Medicare Other | Admitting: Neurology

## 2017-05-25 DIAGNOSIS — G250 Essential tremor: Secondary | ICD-10-CM | POA: Diagnosis not present

## 2017-05-25 DIAGNOSIS — N390 Urinary tract infection, site not specified: Secondary | ICD-10-CM | POA: Diagnosis not present

## 2017-05-25 DIAGNOSIS — Z86718 Personal history of other venous thrombosis and embolism: Secondary | ICD-10-CM | POA: Diagnosis not present

## 2017-05-25 DIAGNOSIS — F329 Major depressive disorder, single episode, unspecified: Secondary | ICD-10-CM | POA: Diagnosis not present

## 2017-05-25 DIAGNOSIS — I10 Essential (primary) hypertension: Secondary | ICD-10-CM | POA: Diagnosis not present

## 2017-05-25 DIAGNOSIS — I69354 Hemiplegia and hemiparesis following cerebral infarction affecting left non-dominant side: Secondary | ICD-10-CM | POA: Diagnosis not present

## 2017-06-09 DIAGNOSIS — F329 Major depressive disorder, single episode, unspecified: Secondary | ICD-10-CM | POA: Diagnosis not present

## 2017-06-09 DIAGNOSIS — N3020 Other chronic cystitis without hematuria: Secondary | ICD-10-CM | POA: Diagnosis not present

## 2017-06-09 DIAGNOSIS — Z86718 Personal history of other venous thrombosis and embolism: Secondary | ICD-10-CM | POA: Diagnosis not present

## 2017-06-09 DIAGNOSIS — I69354 Hemiplegia and hemiparesis following cerebral infarction affecting left non-dominant side: Secondary | ICD-10-CM | POA: Diagnosis not present

## 2017-06-09 DIAGNOSIS — N390 Urinary tract infection, site not specified: Secondary | ICD-10-CM | POA: Diagnosis not present

## 2017-06-09 DIAGNOSIS — Z125 Encounter for screening for malignant neoplasm of prostate: Secondary | ICD-10-CM | POA: Diagnosis not present

## 2017-06-09 DIAGNOSIS — I10 Essential (primary) hypertension: Secondary | ICD-10-CM | POA: Diagnosis not present

## 2017-06-09 DIAGNOSIS — G250 Essential tremor: Secondary | ICD-10-CM | POA: Diagnosis not present

## 2017-06-09 DIAGNOSIS — N35011 Post-traumatic bulbous urethral stricture: Secondary | ICD-10-CM | POA: Diagnosis not present

## 2017-06-14 DIAGNOSIS — E119 Type 2 diabetes mellitus without complications: Secondary | ICD-10-CM | POA: Diagnosis not present

## 2017-06-14 DIAGNOSIS — G8190 Hemiplegia, unspecified affecting unspecified side: Secondary | ICD-10-CM | POA: Diagnosis not present

## 2017-06-14 DIAGNOSIS — E785 Hyperlipidemia, unspecified: Secondary | ICD-10-CM | POA: Diagnosis not present

## 2017-06-14 DIAGNOSIS — G20 Parkinson's disease: Secondary | ICD-10-CM | POA: Diagnosis not present

## 2017-06-14 DIAGNOSIS — R972 Elevated prostate specific antigen [PSA]: Secondary | ICD-10-CM | POA: Diagnosis not present

## 2017-06-14 DIAGNOSIS — I10 Essential (primary) hypertension: Secondary | ICD-10-CM | POA: Diagnosis not present

## 2017-06-15 DIAGNOSIS — N390 Urinary tract infection, site not specified: Secondary | ICD-10-CM | POA: Diagnosis not present

## 2017-06-15 DIAGNOSIS — F329 Major depressive disorder, single episode, unspecified: Secondary | ICD-10-CM | POA: Diagnosis not present

## 2017-06-15 DIAGNOSIS — I10 Essential (primary) hypertension: Secondary | ICD-10-CM | POA: Diagnosis not present

## 2017-06-15 DIAGNOSIS — Z86718 Personal history of other venous thrombosis and embolism: Secondary | ICD-10-CM | POA: Diagnosis not present

## 2017-06-15 DIAGNOSIS — G250 Essential tremor: Secondary | ICD-10-CM | POA: Diagnosis not present

## 2017-06-15 DIAGNOSIS — I69354 Hemiplegia and hemiparesis following cerebral infarction affecting left non-dominant side: Secondary | ICD-10-CM | POA: Diagnosis not present

## 2017-06-17 DIAGNOSIS — I69354 Hemiplegia and hemiparesis following cerebral infarction affecting left non-dominant side: Secondary | ICD-10-CM | POA: Diagnosis not present

## 2017-06-17 DIAGNOSIS — I10 Essential (primary) hypertension: Secondary | ICD-10-CM | POA: Diagnosis not present

## 2017-06-17 DIAGNOSIS — Z86718 Personal history of other venous thrombosis and embolism: Secondary | ICD-10-CM | POA: Diagnosis not present

## 2017-06-17 DIAGNOSIS — G250 Essential tremor: Secondary | ICD-10-CM | POA: Diagnosis not present

## 2017-06-17 DIAGNOSIS — N390 Urinary tract infection, site not specified: Secondary | ICD-10-CM | POA: Diagnosis not present

## 2017-06-17 DIAGNOSIS — F329 Major depressive disorder, single episode, unspecified: Secondary | ICD-10-CM | POA: Diagnosis not present

## 2017-06-21 DIAGNOSIS — Z86718 Personal history of other venous thrombosis and embolism: Secondary | ICD-10-CM | POA: Diagnosis not present

## 2017-06-21 DIAGNOSIS — N390 Urinary tract infection, site not specified: Secondary | ICD-10-CM | POA: Diagnosis not present

## 2017-06-21 DIAGNOSIS — I10 Essential (primary) hypertension: Secondary | ICD-10-CM | POA: Diagnosis not present

## 2017-06-21 DIAGNOSIS — I69354 Hemiplegia and hemiparesis following cerebral infarction affecting left non-dominant side: Secondary | ICD-10-CM | POA: Diagnosis not present

## 2017-06-21 DIAGNOSIS — F329 Major depressive disorder, single episode, unspecified: Secondary | ICD-10-CM | POA: Diagnosis not present

## 2017-06-21 DIAGNOSIS — G250 Essential tremor: Secondary | ICD-10-CM | POA: Diagnosis not present

## 2017-06-23 DIAGNOSIS — I10 Essential (primary) hypertension: Secondary | ICD-10-CM | POA: Diagnosis not present

## 2017-06-23 DIAGNOSIS — Z86718 Personal history of other venous thrombosis and embolism: Secondary | ICD-10-CM | POA: Diagnosis not present

## 2017-06-23 DIAGNOSIS — I69354 Hemiplegia and hemiparesis following cerebral infarction affecting left non-dominant side: Secondary | ICD-10-CM | POA: Diagnosis not present

## 2017-06-23 DIAGNOSIS — G250 Essential tremor: Secondary | ICD-10-CM | POA: Diagnosis not present

## 2017-06-23 DIAGNOSIS — N390 Urinary tract infection, site not specified: Secondary | ICD-10-CM | POA: Diagnosis not present

## 2017-06-23 DIAGNOSIS — F329 Major depressive disorder, single episode, unspecified: Secondary | ICD-10-CM | POA: Diagnosis not present

## 2017-06-25 DIAGNOSIS — F329 Major depressive disorder, single episode, unspecified: Secondary | ICD-10-CM | POA: Diagnosis not present

## 2017-06-25 DIAGNOSIS — Z87440 Personal history of urinary (tract) infections: Secondary | ICD-10-CM | POA: Diagnosis not present

## 2017-06-25 DIAGNOSIS — Z86718 Personal history of other venous thrombosis and embolism: Secondary | ICD-10-CM | POA: Diagnosis not present

## 2017-06-25 DIAGNOSIS — I69354 Hemiplegia and hemiparesis following cerebral infarction affecting left non-dominant side: Secondary | ICD-10-CM | POA: Diagnosis not present

## 2017-06-25 DIAGNOSIS — G20 Parkinson's disease: Secondary | ICD-10-CM | POA: Diagnosis not present

## 2017-06-29 DIAGNOSIS — G20 Parkinson's disease: Secondary | ICD-10-CM | POA: Diagnosis not present

## 2017-06-29 DIAGNOSIS — F329 Major depressive disorder, single episode, unspecified: Secondary | ICD-10-CM | POA: Diagnosis not present

## 2017-06-29 DIAGNOSIS — Z86718 Personal history of other venous thrombosis and embolism: Secondary | ICD-10-CM | POA: Diagnosis not present

## 2017-06-29 DIAGNOSIS — I69354 Hemiplegia and hemiparesis following cerebral infarction affecting left non-dominant side: Secondary | ICD-10-CM | POA: Diagnosis not present

## 2017-06-29 DIAGNOSIS — Z87440 Personal history of urinary (tract) infections: Secondary | ICD-10-CM | POA: Diagnosis not present

## 2017-07-01 DIAGNOSIS — I69354 Hemiplegia and hemiparesis following cerebral infarction affecting left non-dominant side: Secondary | ICD-10-CM | POA: Diagnosis not present

## 2017-07-01 DIAGNOSIS — G20 Parkinson's disease: Secondary | ICD-10-CM | POA: Diagnosis not present

## 2017-07-01 DIAGNOSIS — Z87440 Personal history of urinary (tract) infections: Secondary | ICD-10-CM | POA: Diagnosis not present

## 2017-07-01 DIAGNOSIS — F329 Major depressive disorder, single episode, unspecified: Secondary | ICD-10-CM | POA: Diagnosis not present

## 2017-07-01 DIAGNOSIS — Z86718 Personal history of other venous thrombosis and embolism: Secondary | ICD-10-CM | POA: Diagnosis not present

## 2017-07-05 DIAGNOSIS — I69354 Hemiplegia and hemiparesis following cerebral infarction affecting left non-dominant side: Secondary | ICD-10-CM | POA: Diagnosis not present

## 2017-07-05 DIAGNOSIS — Z87440 Personal history of urinary (tract) infections: Secondary | ICD-10-CM | POA: Diagnosis not present

## 2017-07-05 DIAGNOSIS — F329 Major depressive disorder, single episode, unspecified: Secondary | ICD-10-CM | POA: Diagnosis not present

## 2017-07-05 DIAGNOSIS — G20 Parkinson's disease: Secondary | ICD-10-CM | POA: Diagnosis not present

## 2017-07-05 DIAGNOSIS — Z86718 Personal history of other venous thrombosis and embolism: Secondary | ICD-10-CM | POA: Diagnosis not present

## 2017-07-06 DIAGNOSIS — F329 Major depressive disorder, single episode, unspecified: Secondary | ICD-10-CM | POA: Diagnosis not present

## 2017-07-06 DIAGNOSIS — I69354 Hemiplegia and hemiparesis following cerebral infarction affecting left non-dominant side: Secondary | ICD-10-CM | POA: Diagnosis not present

## 2017-07-06 DIAGNOSIS — Z87440 Personal history of urinary (tract) infections: Secondary | ICD-10-CM | POA: Diagnosis not present

## 2017-07-06 DIAGNOSIS — Z86718 Personal history of other venous thrombosis and embolism: Secondary | ICD-10-CM | POA: Diagnosis not present

## 2017-07-06 DIAGNOSIS — G20 Parkinson's disease: Secondary | ICD-10-CM | POA: Diagnosis not present

## 2017-07-15 ENCOUNTER — Telehealth: Payer: Self-pay | Admitting: Neurology

## 2017-07-15 DIAGNOSIS — G20 Parkinson's disease: Secondary | ICD-10-CM | POA: Diagnosis not present

## 2017-07-15 DIAGNOSIS — Z87440 Personal history of urinary (tract) infections: Secondary | ICD-10-CM | POA: Diagnosis not present

## 2017-07-15 DIAGNOSIS — F329 Major depressive disorder, single episode, unspecified: Secondary | ICD-10-CM | POA: Diagnosis not present

## 2017-07-15 DIAGNOSIS — Z86718 Personal history of other venous thrombosis and embolism: Secondary | ICD-10-CM | POA: Diagnosis not present

## 2017-07-15 DIAGNOSIS — I69354 Hemiplegia and hemiparesis following cerebral infarction affecting left non-dominant side: Secondary | ICD-10-CM | POA: Diagnosis not present

## 2017-07-15 NOTE — Telephone Encounter (Signed)
Pt's wife called in carbidopa-levodopa (SINEMET IR) 25-100 MG tablet is making him very fatigued and he was a little confused yesterday for the 1st time. He takes it TID, she is wanting to decrease to BID. Please call

## 2017-07-15 NOTE — Telephone Encounter (Signed)
I called pt's wife [name], per DPR. She reports that pt has been taking sinemet 1 tablet TID for the past month or so. He seems to be "droopy" and "out of it" and it is hard for him to complete his therapy sessions. Pt's wife reports that he did not have this problem when he was taking 1/2 tablet TID dosing of sinemet. Pt's wife noticed some confusion yesterday with the pt for the first time.  I spoke with [name]. She gave a verbal order to reduce pt's sinemet dosing back to 1/2 tablet TID and she will reassess him at his October appt.  I called pt's wife back, advised her to reduce pt's sinemet back to 1/2 tablet TID and that [name] will reassess pt again at his appt in October. Pt's wife verbalized understanding and gratitude.

## 2017-07-15 NOTE — Telephone Encounter (Signed)
Nothing further needed, thank you. 

## 2017-07-20 DIAGNOSIS — I69354 Hemiplegia and hemiparesis following cerebral infarction affecting left non-dominant side: Secondary | ICD-10-CM | POA: Diagnosis not present

## 2017-07-20 DIAGNOSIS — G20 Parkinson's disease: Secondary | ICD-10-CM | POA: Diagnosis not present

## 2017-07-20 DIAGNOSIS — Z86718 Personal history of other venous thrombosis and embolism: Secondary | ICD-10-CM | POA: Diagnosis not present

## 2017-07-20 DIAGNOSIS — Z87440 Personal history of urinary (tract) infections: Secondary | ICD-10-CM | POA: Diagnosis not present

## 2017-07-20 DIAGNOSIS — F329 Major depressive disorder, single episode, unspecified: Secondary | ICD-10-CM | POA: Diagnosis not present

## 2017-07-21 DIAGNOSIS — Z87440 Personal history of urinary (tract) infections: Secondary | ICD-10-CM | POA: Diagnosis not present

## 2017-07-21 DIAGNOSIS — G20 Parkinson's disease: Secondary | ICD-10-CM | POA: Diagnosis not present

## 2017-07-21 DIAGNOSIS — Z86718 Personal history of other venous thrombosis and embolism: Secondary | ICD-10-CM | POA: Diagnosis not present

## 2017-07-21 DIAGNOSIS — F329 Major depressive disorder, single episode, unspecified: Secondary | ICD-10-CM | POA: Diagnosis not present

## 2017-07-21 DIAGNOSIS — I69354 Hemiplegia and hemiparesis following cerebral infarction affecting left non-dominant side: Secondary | ICD-10-CM | POA: Diagnosis not present

## 2017-07-26 DIAGNOSIS — G20 Parkinson's disease: Secondary | ICD-10-CM | POA: Diagnosis not present

## 2017-07-26 DIAGNOSIS — F329 Major depressive disorder, single episode, unspecified: Secondary | ICD-10-CM | POA: Diagnosis not present

## 2017-07-26 DIAGNOSIS — Z86718 Personal history of other venous thrombosis and embolism: Secondary | ICD-10-CM | POA: Diagnosis not present

## 2017-07-26 DIAGNOSIS — I69354 Hemiplegia and hemiparesis following cerebral infarction affecting left non-dominant side: Secondary | ICD-10-CM | POA: Diagnosis not present

## 2017-07-26 DIAGNOSIS — Z87440 Personal history of urinary (tract) infections: Secondary | ICD-10-CM | POA: Diagnosis not present

## 2017-08-02 DIAGNOSIS — Z86718 Personal history of other venous thrombosis and embolism: Secondary | ICD-10-CM | POA: Diagnosis not present

## 2017-08-02 DIAGNOSIS — Z87440 Personal history of urinary (tract) infections: Secondary | ICD-10-CM | POA: Diagnosis not present

## 2017-08-02 DIAGNOSIS — I69354 Hemiplegia and hemiparesis following cerebral infarction affecting left non-dominant side: Secondary | ICD-10-CM | POA: Diagnosis not present

## 2017-08-02 DIAGNOSIS — F329 Major depressive disorder, single episode, unspecified: Secondary | ICD-10-CM | POA: Diagnosis not present

## 2017-08-02 DIAGNOSIS — G20 Parkinson's disease: Secondary | ICD-10-CM | POA: Diagnosis not present

## 2017-08-11 DIAGNOSIS — F329 Major depressive disorder, single episode, unspecified: Secondary | ICD-10-CM | POA: Diagnosis not present

## 2017-08-11 DIAGNOSIS — Z86718 Personal history of other venous thrombosis and embolism: Secondary | ICD-10-CM | POA: Diagnosis not present

## 2017-08-11 DIAGNOSIS — I69354 Hemiplegia and hemiparesis following cerebral infarction affecting left non-dominant side: Secondary | ICD-10-CM | POA: Diagnosis not present

## 2017-08-11 DIAGNOSIS — Z87440 Personal history of urinary (tract) infections: Secondary | ICD-10-CM | POA: Diagnosis not present

## 2017-08-11 DIAGNOSIS — G20 Parkinson's disease: Secondary | ICD-10-CM | POA: Diagnosis not present

## 2017-08-12 ENCOUNTER — Ambulatory Visit (INDEPENDENT_AMBULATORY_CARE_PROVIDER_SITE_OTHER): Payer: Medicare Other | Admitting: Neurology

## 2017-08-12 ENCOUNTER — Encounter: Payer: Self-pay | Admitting: Neurology

## 2017-08-12 VITALS — BP 110/65 | HR 64 | Ht 74.0 in | Wt 224.0 lb

## 2017-08-12 DIAGNOSIS — G20 Parkinson's disease: Secondary | ICD-10-CM | POA: Diagnosis not present

## 2017-08-12 DIAGNOSIS — I69354 Hemiplegia and hemiparesis following cerebral infarction affecting left non-dominant side: Secondary | ICD-10-CM

## 2017-08-12 NOTE — Progress Notes (Signed)
Subjective:    Patient ID: Robert [name] is a 71 y.o. male.  HPI     Interim history:   Mr. 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 The patient is accompanied by his wife again today. I first met him on 05/12/2017 at the request of his primary care physician, at which time he reported a right-sided hand tremor for the past few years, worse over the past 3 months. He has a prior diagnosis of stroke some 10 years ago with residual left-sided weakness. His wife called in the interim in July 2018 reporting the patient felt lightheaded sometimes after taking his Sinemet. She was advised to reinforce good hydration and slow position changes to the patient as well as the possibility of starting compression stockings. His wife called again in September 2018 reporting that he was groggy on the one pill 3 times a day of Sinemet. She was advised to reduce a pack to half a pill 3 times a day.  Today, 08/12/2017: He reports being able to tolerate the Sinemet at the lower dose, he currently takes half a pill 3 times a day. His wife reports that when he was on the full pill 3 times a day he had low blood pressure as well and had to come off of his ARB. When he reduced his Sinemet to half a pill 3 times a day he was able to restart his blood pressure medication without really low blood pressure. He feels that his tremor has improved when he started Sinemet. His wife has noticed improvement as well.  The patient's allergies, current medications, family history, past medical history, past social history, past surgical  history and problem list were reviewed and updated as appropriate.   Previously (copied from previous notes for reference):   05/12/2017: (He) has had a right-sided tremor for the past years, but significantly worsened over the past 3 months. I reviewed his hospital records including discharge summary and test results. He had a head CT without contrast on 04/22/2017 which I reviewed:IMPRESSION: Chronic right frontal infarct.  No acute abnormality  He had a brain MRI without contrast on 04/22/2017 which I reviewed: IMPRESSION: 1. No acute intracranial abnormality. No finding as explanation for right-sided tremors. 2. Large right MCA distribution chronic infarction. Absent flow void in the right internal carotid artery petrous and cavernous segments may represent slow flow or occlusion. 3. 2 cm nodule in left posterior neck subcutaneous fat, probably a dermal appendage cysts, direct visualization recommended. Laboratory test results included TSH, B12, folate, within normal limits. He was discharged to home with home health therapy. He was discharged on his current medications are baby aspirin, losartan, multivitamin, sertraline, simvastatin. He was advised to stop taking hydrocodone. For his UTI, he was treated in the hospital with IV ceftriaxone and discharged on Keflex. He was noted to have a right-sided tremor during the hospital stay. He presented to the emergency room on 05/08/2017 with weakness and increase in tremor, he had severe constipation and had not had a bowel movement in 10 days. I reviewed the emergency room records. Labs were concerning for UTI and CT abdomen showed evidence of constipation.  He was started on another Abx. He sees Dr. Risa Grill  in urology, had a urological procedure about a year or so ago.  I reviewed your office note from 03/17/17, which you kindly included. He was started on Sertraline low dose at the time. This was recently increased a couple of days ago to 50 mg once  daily. His anxiety is indeed better. He is getting home health physical therapy and is in the process of moving to the main level for his bedroom as opposed upstairs as he is not able to manage stairs at this time. He is not aware of any family history of Parkinson's disease but does not know much about his family history, mother died young, was killed, does not know much about father's medical history. No obvious family history of Parkinson's disease in his siblings. He has not fallen recently but is not mobilizing very much, he was able to use his cane and with the brace on the left leg was able to walk a little bit, since the tremor has increased he has not been able to go for walks with his wife. He is retired. He worked for [employer] for 25 years. He does not drink caffeine on a daily basis. He has had some trouble feeding himself with the right hand since the tremor has become worse.  His Past Medical History Is Significant For: Past Medical History:  Diagnosis Date  . CVA (cerebral vascular accident) (HCC)    with left sided hemiparesis  . Diverticulosis   . Frequency of urination   . GERD (gastroesophageal reflux disease)   . Gross hematuria   . History of acute pyelonephritis    10-13-2012  . History of CVA with residual deficit    2008--  left side of body weakness and foot drop (wears leg brace and uses cane)  . History of DVT of lower extremity    2008--  cva  . Hypercholesteremia   . Hyperlipidemia   . Hyperlipidemia   . Hypertension   . 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 Left leg DVT (HCC)   . 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 Urethral stricture   . Urgency of urination   . Weakness of left side of body    secondary to cva 2008  . Wears glasses   . Wears hearing aid    bilateral-- wears intermittantly    His Past Surgical History Is Significant For: Past Surgical History:  Procedure Laterality Date  . CYSTOSCOPY WITH RETROGRADE  URETHROGRAM N/A 10/23/2015   Procedure: CYSTOSCOPY WITH RETROGRADE URETHROGRAM;  Surgeon: Barron Alvine, MD;  Location[facility];  Service: Urology;  Laterality: N/A;  . CYSTOSCOPY WITH URETHRAL DILATATION N/A 10/23/2015   Procedure: CYSTOSCOPY WITH URETHRAL BALLOON DILATATION;  Surgeon: Barron Alvine, MD;  Location: [facility];  Service: Urology;  Laterality: N/A;  BALLOON DILATION   . IVC FILTER PLACEMENT ([facility] HX)  2008    His Family History Is Significant For: Family History  Problem Relation Age of Onset  . Diabetes Sister   . Lung cancer Brother        Post 9/11 voluntary work in [employer]    His Social History Is Significant For: Social History   Social History  . Marital status: Married    Spouse name: N/A  . Number of children: N/A  . Years of education: N/A   Social History Main Topics  . Smoking status: Former Smoker    Years:  10.00    Types: Cigarettes    Quit date: 10/26/1970  . Smokeless tobacco: Never Used  . Alcohol use No  . Drug use: No  . Sexual activity: Not Asked   Other Topics Concern  . None   Social History Narrative   Lives in [place] with wife. He is a Cytogeneticist.   Has one son who lives in [place]. He is healthy.   [facility] [place], [place].    His Allergies Are:  Allergies  Allergen Reactions  . Propofol Other (See Comments)    "hiccups for weeks"  :   His Current Medications Are:  Outpatient Encounter Prescriptions as of 08/12/2017  Medication Sig  . aspirin 81 MG tablet Take 81 mg by mouth daily.  . carbidopa-levodopa (SINEMET IR) 25-100 MG tablet Take 1/2 pill twice daily x 1 week, then 1/2 pill 3 times a day x 1 week, then 1 pill 3 times a day thereafter. (Patient taking differently: Take 0.5 tablets by mouth 3 (three) times daily. Take 1/2 pill twice daily x 1 week, then 1/2 pill 3 times a day x 1 week, then 1 pill 3 times a day thereafter.)  . losartan (COZAAR) 50 MG tablet Take  50 mg by mouth every morning.  . polyethylene glycol (MIRALAX) packet Take 17 g by mouth daily.  . simvastatin (ZOCOR) 20 MG tablet Take 20 mg by mouth every evening.  . [DISCONTINUED] cephALEXin (KEFLEX) 500 MG capsule Take 1 capsule (500 mg total) by mouth 4 (four) times daily.  . [DISCONTINUED] Multiple Vitamin (MULTIVITAMIN) tablet Take 1 tablet by mouth daily.  . [DISCONTINUED] senna (SENOKOT) 8.6 MG TABS tablet Take 2 tablets by mouth daily.  . [DISCONTINUED] sertraline (ZOLOFT) 25 MG tablet Take 50 mg by mouth daily.    No facility-administered encounter medications on file as of 08/12/2017.   :  Review of Systems:  Out of a complete 14 point review of systems, all are reviewed and negative with the exception of these symptoms as listed below: Review of Systems  Neurological:       Patient reports that they had to decrease the sinemet due to him feeling tired all the time.     Objective:  Neurological Exam  Physical Exam Physical Examination:   Vitals:   08/12/17 1353  BP: 110/65  Pulse: 64    General Examination: The patient is a very pleasant 71 y.o. male in no acute distress. He appears well-developed and well-nourished and well groomed. Good spirits.   HEENT: Normocephalic, atraumatic, pupils are equal, round and reactive to light and accommodation. Extraocular tracking shows mild saccadic breakdown, no nystagmus. Voice is mildly hypophonic, no significant dysarthria as noted, he does have mild left lower face weakness. Hearing is grossly intact are mildly impaired, he does have a left hearing aid in place. Airway examination reveals mild mouth dryness, tongue protrudes centrally and palate elevates symmetrically. Neck is very mildly rigid, has good range of motion. He has a very mild/slight intermittent lower lip tremor.    Chest: Clear to auscultation without wheezing, rhonchi or crackles noted.  Heart: S1+S2+0, regular and normal without murmurs, rubs or gallops  noted.   Abdomen: Soft, non-tender and non-distended with normal bowel sounds appreciated on auscultation.  Extremities: There is 1+ pitting edema in the distal lower extremities bilaterally, L more than R.   Skin: Warm and dry without trophic changes noted.   Musculoskeletal: exam reveals no obvious joint deformities, tenderness or  joint swelling or erythema.   Neurologically:  Mental status: The patient is awake, alert and oriented in all 4 spheres. His immediate and remote memory, attention, language skills and fund of knowledge are appropriate. There is no evidence of aphasia, agnosia, apraxia or anomia. Speech is clear with normal prosody and enunciation. Thought process is linear. Mood is normal and affect is normal.  Cranial nerves II - XII are as described above under HEENT exam. In addition: shoulder shrug is normal with equal shoulder height noted. Motor exam: Normal bulk, strength  of 4+ out of 5 on the right, mild increase in tone in the right upper extremity with cogwheeling noted, left upper and lower extremity spasticity noted and hyperreflexia on the left, 3 out of 5 strength proximally in the left upper extremity, otherwise dense weakness in the left upper extremity, 3 out of 5 strength distally on the left side, significant proximal weakness in the left lower extremity. He has no tremor on the left. Fine motor skills are not possible to test on the left. He has a mild resting tremor in the right upper extremity none in the right lower extremity. He has a mild postural tremor in the right upper extremity as well. Fine motor skills on the right are mild to moderately impaired. Romberg is not testable. He's not able to stand or walk for me. In [place].   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 He has stable left-sided weakness from his stroke 10 years ago. He had brain imaging and testing in June. His tremor has improved a little bit on Sinemet. He was not able to tolerate one full pill 3 times a day due to drugged feeling, lethargy, low blood pressure values and also some hallucinations and delusions reported by wife. At the half pill 3 times a day he reports being able to tolerated and also tremor is a little better. He is motivated to continue with the medication. We talked about parkinsonism again today and possible progression. He is in home health therapy and did not need any prescription refill today. Of note, he no longer is on low-dose sertraline, his anxiety improved after they were able to move downstairs. I suggested a 6 month follow-up, sooner as needed. I answered all their questions today and he was in agreement.  I spent 25 minutes in total face-to-face time with the patient, more than 50% of which was spent in counseling and coordination of care, reviewing test results, reviewing medication and discussing or reviewing the diagnosis of PD, its prognosis and treatment options. Pertinent laboratory and imaging test results that were available during this visit with the patient were reviewed by me and considered in my medical decision making (see chart for details).

## 2017-08-12 NOTE — Patient Instructions (Addendum)
We will continue with your Sinemet at the current dose of 1/2 pill 3 times a day.  Your tremor is better.  Let's do a 6 month follow up.

## 2017-08-16 DIAGNOSIS — Z87440 Personal history of urinary (tract) infections: Secondary | ICD-10-CM | POA: Diagnosis not present

## 2017-08-16 DIAGNOSIS — F329 Major depressive disorder, single episode, unspecified: Secondary | ICD-10-CM | POA: Diagnosis not present

## 2017-08-16 DIAGNOSIS — I69354 Hemiplegia and hemiparesis following cerebral infarction affecting left non-dominant side: Secondary | ICD-10-CM | POA: Diagnosis not present

## 2017-08-16 DIAGNOSIS — G20 Parkinson's disease: Secondary | ICD-10-CM | POA: Diagnosis not present

## 2017-08-16 DIAGNOSIS — Z86718 Personal history of other venous thrombosis and embolism: Secondary | ICD-10-CM | POA: Diagnosis not present

## 2017-08-23 DIAGNOSIS — Z125 Encounter for screening for malignant neoplasm of prostate: Secondary | ICD-10-CM | POA: Diagnosis not present

## 2017-08-30 DIAGNOSIS — N3020 Other chronic cystitis without hematuria: Secondary | ICD-10-CM | POA: Diagnosis not present

## 2017-08-30 DIAGNOSIS — N401 Enlarged prostate with lower urinary tract symptoms: Secondary | ICD-10-CM | POA: Diagnosis not present

## 2017-08-30 DIAGNOSIS — R3914 Feeling of incomplete bladder emptying: Secondary | ICD-10-CM | POA: Diagnosis not present

## 2017-08-30 DIAGNOSIS — N35013 Post-traumatic anterior urethral stricture: Secondary | ICD-10-CM | POA: Diagnosis not present

## 2017-09-13 DIAGNOSIS — G8190 Hemiplegia, unspecified affecting unspecified side: Secondary | ICD-10-CM | POA: Diagnosis not present

## 2017-09-13 DIAGNOSIS — I10 Essential (primary) hypertension: Secondary | ICD-10-CM | POA: Diagnosis not present

## 2017-09-13 DIAGNOSIS — G20 Parkinson's disease: Secondary | ICD-10-CM | POA: Diagnosis not present

## 2017-09-13 DIAGNOSIS — E119 Type 2 diabetes mellitus without complications: Secondary | ICD-10-CM | POA: Diagnosis not present

## 2017-11-01 DIAGNOSIS — J069 Acute upper respiratory infection, unspecified: Secondary | ICD-10-CM | POA: Diagnosis not present

## 2017-11-15 ENCOUNTER — Telehealth: Payer: Self-pay | Admitting: Neurology

## 2017-11-15 NOTE — Telephone Encounter (Signed)
Pts wife is calling wanting to know if pt can increase dosage of carbidopa-levodopa (SINEMET IR) 25-100 MG tablet. Pt has been shaking more than normal. Please call to discuss further

## 2017-11-15 NOTE — Telephone Encounter (Signed)
I called pt's wife, [name], per [place]. She reports that pt's tremors have increased. Pt is tolerating the sinemet 1/2 tablet TID. He was tired and lethargic while taking sinemet 1 tablet TID, but pt and wife are willing to increase back to 1 tablet TID if this will help his tremors. I advised pt's wife that I will discuss this with [name] and call them back.

## 2017-11-15 NOTE — Telephone Encounter (Signed)
I called pt's wife, [name], per [place]. I advised her that [name] recommends increasing pt's sinemet to 1 tablet TID. Pt's wife verbalized understanding and will call us with any concerns regarding side effects.

## 2017-11-15 NOTE — Telephone Encounter (Signed)
[place] for pt to increase C/L to 1 pill tid.

## 2017-11-17 NOTE — Telephone Encounter (Signed)
Pt's wife called back and asked to speak with me for clarification. I was skyped, and took the call. Pt's wife is concerned about pt stopping the sinemet and then having to wait for a second opinion. She does not understand why pt would need a second opinion since [name] is the neurologist and her opinion is what Dr. Orland Mustard requested. I explained the purpose of a second opinion with a neurologist. Pt's wife is still questioning pt stopping the sinemet and waiting for a second opinion. I offered pt an appt with Hoyle Sauer, NP on 11/22/17 at 2:15pm, which pt's wife accepted to discuss plan of care from here. She wants me to call her back if [name] does not agree with the NP appt on Monday, and pt's wife will go to Dr. Darien Ramus office to discuss a second opinion with him. Pt's wife would prefer the appt here with the NP.

## 2017-11-17 NOTE — Telephone Encounter (Signed)
Pt's wife returned my call, asked to only speak to me regarding this issue, I was skyped and took the call. Pt's wife is agreeable to pt seeing Hoyle Sauer, NP on Monday. I reiterated that [name] does recommend a 2nd opinion with another neurology practice as well, which pt's wife is agreeable to, and will speak to Dr. Orland Mustard about. I reminded pt's wife of check in for the appt on Monday. She verbalized understanding.

## 2017-11-17 NOTE — Telephone Encounter (Signed)
Pt's wife [name]) called he increased the medication today taking 1st dose at 6:30am this morning, then had breakfast an hour later, he did not have any side effects. He took 2nd dose about 11am, now he is feeling light headed, he has no had lunch yet. She is wanting to know if it should be spaced out more?? He is at home by himself, she is concerned with him being light headed he could fall. Please call to her to advise at [phone].

## 2017-11-17 NOTE — Telephone Encounter (Signed)
I spoke with [name] regarding this, she recommends that pt stop the sinemet altogether, and recommends that pt speak to PCP regarding pt seeing another neurologist for a 2nd opinion.   I called pt's wife and explained this to her. She is agreeable to pt stopping the sinemet, and will speak to Dr. Orland Mustard about a referral to another neurologist for a 2nd opinion.

## 2017-11-17 NOTE — Telephone Encounter (Signed)
I spoke with [name]. She recommends that pt come in for an appt with Hoyle Sauer, NP on Monday to discuss the sinemet and increase in tremors.   I called pt's wife again, per her request, to advise her of this. No answer, left a message asking her to call me back. If pt's wife calls back, please advise her that [name] does recommend that pt keep his appt with Hoyle Sauer, NP on Monday.

## 2017-11-19 NOTE — Progress Notes (Addendum)
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 The patient is accompanied by his wife again today. I first met him on 05/12/2017 at the request of his primary care physician, at which time he reported a right-sided hand tremor for the past few years, worse over the past 3 months. He has a prior diagnosis of stroke some 10 years ago with residual left-sided weakness. His wife called in the interim in July 2018 reporting the patient felt lightheaded sometimes after taking his Sinemet. She was advised to reinforce good hydration and slow position changes to the patient as well as the possibility of starting compression stockings. His wife called again in September 2018 reporting that he was groggy on the one pill 3 times a day of Sinemet. She was advised to reduce a pack to half a pill 3 times a day UPDATE 1/28/2019CM Robert [name], 72 year old male returns for follow-up with history of parkinsonism /tremor and stroke event 10 years ago with residual left-sided weakness.  His tremors have subsided being off of the Sinemet  since last week.  He has been asked to get a second opinion from a movement specialist at another neurologic practice.  He apparently was having side effects to the full tablet of Sinemet 3 times a day, cut back to a half and still having problems so he titrated off of the  medication altogether.  He has minimal  tremor in his right hand today.  He is more alert, denies any daytime drowsiness.  Gets around at home with a cane no recent falls he returns for reevaluation REVIEW OF SYSTEMS: Full 14 system review of systems performed and notable only for those listed, all others are neg:  Constitutional: neg  Cardiovascular: neg Ear/Nose/Throat: neg  Skin: neg Eyes: neg Respiratory: neg Gastroitestinal: neg  Hematology/Lymphatic: neg  Endocrine: neg Musculoskeletal: Joint pain Allergy/Immunology: neg Neurological: History of tremors Psychiatric: neg Sleep : neg   ALLERGIES: Allergies  Allergen Reactions  . Propofol Other (See Comments)    "hiccups for weeks"    HOME MEDICATIONS: Outpatient Medications Prior to Visit  Medication Sig Dispense Refill  . aspirin 81 MG tablet Take 81 mg by mouth daily.    Marland Kitchen losartan (COZAAR) 50 MG tablet Take 50 mg by mouth every morning.    . polyethylene glycol (MIRALAX) packet Take 17 g by mouth daily. 14 each 0  . simvastatin (ZOCOR) 20 MG tablet Take 20 mg by mouth every evening.    . carbidopa-levodopa (SINEMET IR) 25-100 MG tablet Take 1/2 pill twice daily x 1 week, then 1/2 pill 3 times a day x 1 week, then 1 pill 3 times a day thereafter. (Patient not taking: Reported on 11/22/2017) 90 tablet 5   No facility-administered medications prior to visit.     PAST MEDICAL HISTORY: Past Medical History:  Diagnosis Date  . CVA (cerebral vascular accident) ([place])    with left sided hemiparesis  . Diverticulosis   . Frequency of urination   . GERD (gastroesophageal reflux disease)   .  Gross hematuria   . History of acute pyelonephritis    10-13-2012  . History of CVA with residual deficit    2008--  left side of body weakness and foot drop (wears leg brace and uses cane)  . History of DVT of lower extremity    2008--  cva  . Hypercholesteremia   . Hyperlipidemia   . Hyperlipidemia   . Hypertension   . 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  Left leg DVT ([place])   . 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 Urethral stricture   . Urgency of urination   . Weakness of left side of body    secondary to cva 2008  . Wears glasses   . Wears hearing aid    bilateral-- wears intermittantly    PAST SURGICAL HISTORY: Past Surgical History:  Procedure Laterality Date  . CYSTOSCOPY WITH RETROGRADE URETHROGRAM N/A 10/23/2015   Procedure: CYSTOSCOPY WITH RETROGRADE URETHROGRAM;  Surgeon: [name], MD;  Location: [facility];  Service: Urology;  Laterality: N/A;  . CYSTOSCOPY WITH URETHRAL DILATATION N/A 10/23/2015   Procedure: CYSTOSCOPY WITH URETHRAL BALLOON DILATATION;  Surgeon: [name], MD;  Location: [facility];  Service: Urology;  Laterality: N/A;  BALLOON DILATION   . IVC FILTER PLACEMENT ([place] HX)  2008    FAMILY HISTORY: Family History  Problem Relation Age of Onset  . Diabetes Sister   . Lung cancer Brother        Post 9/11 voluntary work in [place]: Social History   Socioeconomic History  . Marital status: Married    Spouse name: Not on file  . Number of children: Not on file  . Years of education: Not on file  . Highest education level: Not on file  Social Needs  . Financial resource strain: Not on file  . Food insecurity - worry: Not on file  . Food insecurity - inability: Not on file  . Transportation needs - medical: Not on file  . Transportation needs - non-medical: Not on file  Occupational History  . Not on file  Tobacco Use  . Smoking status: Former Smoker    Years: 10.00    Types: Cigarettes    Last attempt to quit: 10/26/1970    Years since quitting: 47.1  . Smokeless tobacco: Never Used  Substance and Sexual Activity  . Alcohol use: No  . Drug use: No  . Sexual activity: Not on file  Other Topics Concern  . Not on file  Social History Narrative   Lives in [place] with wife. He is a English as a second language teacher.   Has one son who lives  in [place]. He is healthy.   [facility] [place] [place].     PHYSICAL EXAM  Vitals:   11/22/17 1401  BP: 122/69  Pulse: 81  Weight: 224 lb (101.6 kg)   Body mass index is 28.76 kg/m.  Generalized: Well developed, obese male no acute distress , negative Myerson sign Head: normocephalic and atraumatic,. Oropharynx benign  Neck: Supple, no carotid bruits  Cardiac: Regular rate rhythm, no murmur  Musculoskeletal: No deformity   Neurological examination   Mentation: Alert oriented to time, place, history taking. Attention span and concentration appropriate. Recent and remote memory intact.  Follows all commands speech is mildly hypophonic no dysarthria  Cranial nerve II-XII: Fundoscopic exam reveals sharp disc margins.Pupils were equal  round reactive to light extraocular movements were full, visual field were full on confrontational test.  Left lower facial weakness ,hearing was intact to finger rubbing bilaterally. Uvula tongue midline. head turning and shoulder shrug were normal and symmetric.Tongue protrusion into cheek strength was normal. Motor: 4 out of 5 on the right and mild cogwheeling at right wrist mild resting tremor.  Left upper and lower extremity 3 out of 5 with significant proximal weakness in the left lower extremity AFO in place.   Sensory: normal and symmetric to light touch, pinprick, and  Vibration, proprioception  Coordination: finger-nose-finger,  no dysmetria on the right unable to perform on the left Gait and Station: In wheelchair , requires 1+ assist to stand, ambulated 30 feet in the hall with standby assist and cane.  AFO in place.  No difficulty with turns slow steady gait   DIAGNOSTIC DATA (LABS, IMAGING, TESTING) - I reviewed patient records, labs, notes, testing and imaging myself where available.  Lab Results  Component Value Date   WBC 7.4 05/08/2017   HGB 15.0 05/08/2017   HCT 43.9 05/08/2017   MCV 87.3 05/08/2017   PLT  284 05/08/2017      Component Value Date/Time   NA 140 05/08/2017 1427   K 3.9 05/08/2017 1427   CL 105 05/08/2017 1427   CO2 28 05/08/2017 1427   GLUCOSE 145 (H) 05/08/2017 1427   BUN 13 05/08/2017 1427   CREATININE 1.05 05/08/2017 1427   CALCIUM 9.3 05/08/2017 1427   PROT 7.1 05/08/2017 1427   ALBUMIN 3.6 05/08/2017 1427   AST 24 05/08/2017 1427   ALT 31 05/08/2017 1427   ALKPHOS 69 05/08/2017 1427   BILITOT 0.6 05/08/2017 1427   GFRNONAA >60 05/08/2017 1427   GFRAA >60 05/08/2017 1427    Lab Results  Component Value Date   LHTDSKAJ68 115 04/22/2017   Lab Results  Component Value Date   TSH 2.053 04/22/2017      ASSESSMENT AND PLAN Robert [name] a very pleasant 72 year old male with an underlying complex medical history of stroke in 2008 with residual left-sided weakness, 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 He has stable left-sided weakness from his stroke 10 years ago. He had brain imaging and testing in June.  He was not able to tolerate one full pill Sinemet 3 times a day due to drugged feeling, lethargy, low blood pressure values and also some hallucinations and delusions reported by wife.  Sinemet was stopped altogether last week and tremor is better at present.  He still intends to get a second opinion.   Stay off sinemet for now  Continue to use cane for safe ambulation F/U as needed with Dr Rexene Alberts Dennie Bible, [facility], [facility], [facility] [address], [place], [place] [address] [phone]  I reviewed the above note and documentation by the Nurse Practitioner and agree with the history, physical exam,  assessment and plan as outlined above. I was immediately available for face-to-face consultation. Star Age, MD, PhD [facility])

## 2017-11-22 ENCOUNTER — Encounter: Payer: Self-pay | Admitting: Nurse Practitioner

## 2017-11-22 ENCOUNTER — Ambulatory Visit (INDEPENDENT_AMBULATORY_CARE_PROVIDER_SITE_OTHER): Payer: Medicare Other | Admitting: Nurse Practitioner

## 2017-11-22 VITALS — BP 122/69 | HR 81 | Wt 224.0 lb

## 2017-11-22 DIAGNOSIS — R269 Unspecified abnormalities of gait and mobility: Secondary | ICD-10-CM | POA: Insufficient documentation

## 2017-11-22 DIAGNOSIS — R251 Tremor, unspecified: Secondary | ICD-10-CM | POA: Diagnosis not present

## 2017-11-22 NOTE — Patient Instructions (Signed)
Stay off sinemet for now  Continue to use cane for safe ambulation F/U as needed with Dr Rexene Alberts

## 2017-11-24 ENCOUNTER — Encounter: Payer: Self-pay | Admitting: Neurology

## 2017-11-29 DIAGNOSIS — N35013 Post-traumatic anterior urethral stricture: Secondary | ICD-10-CM | POA: Diagnosis not present

## 2017-11-29 DIAGNOSIS — N3020 Other chronic cystitis without hematuria: Secondary | ICD-10-CM | POA: Diagnosis not present

## 2017-11-29 DIAGNOSIS — R3914 Feeling of incomplete bladder emptying: Secondary | ICD-10-CM | POA: Diagnosis not present

## 2017-12-01 NOTE — Progress Notes (Signed)
Robert [name] was seen today in the movement disorders clinic for neurologic consultation at the request of [name], MD.  The consultation is for the evaluation of 2nd opinion re: PD.  Pt currently under the care of [name]. The records that were made available to me were reviewed.  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 in July, 2018 for the evaluation of tremor.  He was diagnosed with Parkinson's disease in July, 2018 and started on carbidopa/levodopa 25/100 and was to work to 1 tablet 3 times per day.  He called after starting the medication because of dizziness and what sounds like some cognitive change and the dose was decreased from 1 tablet 3 times per day to half a tablet 3 times per day.  They called Dr. [name] office in January, 2019 to state that when they tried to increase the medication, he got lightheaded again.  He was told to discontinue the levodopa and to ask his primary care physician for another opinion, which is the reason he presents today.  He does state that the lightheadedness is better now that off carbidopa/levodopa 25/100 but also states that his BP medication was cut back the same day , from 50 mg to 25 mg  Specific Symptoms:  Tremor: Yes.  , R arm and R leg a little Family hx of similar:  No.  Voice: softer Sleep:   Vivid Dreams:  No.  Acting out dreams:  No. Wet Pillows: No. Postural symptoms:  Yes.  , but related to stroke 10 years ago and balance change has been stable over 5+ years.  Stroke was over 10 years ago  Falls?  No. Bradykinesia symptoms: difficulty getting out of a chair, due to stroke per patient/wife Loss of smell:  No. Loss of taste:  No. Urinary Incontinence:  No. - gets nocturia 1-2 times per night Difficulty Swallowing:  Yes.  , mostly with saliva.  Has to swallow liquid slow but not different since stroke.   Handwriting, micrographia: Yes.    Trouble with ADL's:  Yes.  , because of stroke   Trouble buttoning clothing: Yes.   Depression:  No. Memory changes:  Yes.  , some short term memory Hallucinations:  No.  visual distortions: Yes.   N/V:  No. Lightheaded:  Yes.   - better right now after d/c of carbidopa/levodopa  Syncope: No. Diplopia:  No. Dyskinesia:  No.   MRI of the brain from June, 2018 was personally reviewed by me.  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  PREVIOUS MEDICATIONS: Sinemet  ALLERGIES:   Allergies  Allergen Reactions  . Propofol Other (See Comments)    "hiccups for weeks"    CURRENT MEDICATIONS:  Outpatient Encounter Medications as of 12/06/2017  Medication Sig  . aspirin 81 MG tablet Take 81 mg by mouth daily.  Marland Kitchen losartan (COZAAR) 50 MG tablet Take 25 mg by mouth every morning.   . polyethylene glycol (MIRALAX) packet Take 17 g by mouth daily.  . simvastatin (ZOCOR) 20 MG tablet Take 20 mg by mouth every evening.  . [DISCONTINUED] carbidopa-levodopa (SINEMET IR) 25-100 MG tablet Take 1/2 pill twice daily x 1 week, then 1/2 pill 3 times a day x 1 week, then 1 pill 3 times a day thereafter. (Patient not taking: Reported on 11/22/2017)   No facility-administered encounter medications on file as of 12/06/2017.  PAST MEDICAL HISTORY:   Past Medical History:  Diagnosis Date  . CVA (cerebral vascular accident) ([place])    with left sided hemiparesis  . Diverticulosis   . Frequency of urination   . GERD (gastroesophageal reflux disease)   . Gross hematuria   . History of acute pyelonephritis    10-13-2012  . History of CVA with residual deficit    2008--  left side of body weakness and foot drop (wears leg brace and uses cane)  . History of DVT of lower extremity    2008--  cva  . Hypercholesteremia   . Hyperlipidemia   . Hyperlipidemia   . Hypertension   . 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 Left leg DVT ([place])   . S/P insertion of IVC (inferior vena caval) filter      2008  . Urethral stricture   . Urgency of urination   . Weakness of left side of body    secondary to cva 2008  . Wears glasses   . Wears hearing aid    bilateral-- wears intermittantly    PAST SURGICAL HISTORY:   Past Surgical History:  Procedure Laterality Date  . CYSTOSCOPY WITH RETROGRADE URETHROGRAM N/A 10/23/2015   Procedure: CYSTOSCOPY WITH RETROGRADE URETHROGRAM;  Surgeon: [name], MD;  Location: [facility];  Service: Urology;  Laterality: N/A;  . CYSTOSCOPY WITH URETHRAL DILATATION N/A 10/23/2015   Procedure: CYSTOSCOPY WITH URETHRAL BALLOON DILATATION;  Surgeon: [name], MD;  Location: [facility];  Service: Urology;  Laterality: N/A;  BALLOON DILATION   . IVC FILTER PLACEMENT ([place] HX)  2008    SOCIAL HISTORY:   Social History   Socioeconomic History  . Marital status: Married    Spouse name: Not on file  . Number of children: Not on file  . Years of education: Not on file  . Highest education level: Not on file  Social Needs  . Financial resource strain: Not on file  . Food insecurity - worry: Not on file  . Food insecurity - inability: Not on file  . Transportation needs - medical: Not on file  . Transportation needs - non-medical: Not on file  Occupational History  . Occupation: retired    Comment: Public librarian  Tobacco Use  . Smoking status: Former Smoker    Years: 10.00    Types: Cigarettes    Last attempt to quit: 10/26/1970    Years since quitting: 47.1  . Smokeless tobacco: Never Used  Substance and Sexual Activity  . Alcohol use: No  . Drug use: No  . Sexual activity: Not on file  Other Topics Concern  . Not on file  Social History Narrative   Lives in [place] with wife. He is a English as a second language teacher.   Has one son who lives in [place]. He is healthy.   [facility] [place], [place].    FAMILY HISTORY:   Family Status  Relation Name Status  . Sister 3 Alive  . Brother 4 Alive  .  Mother  Deceased  . Father  Deceased  . Son  Alive    ROS:  A complete 10 system review of systems was obtained and was unremarkable apart from what is mentioned above.  PHYSICAL EXAMINATION:    VITALS:   Vitals:   12/06/17 1412  BP: 118/88  SpO2: 99%   Orthostatic VS for the past 24 hrs (Last 3 readings):  BP- Lying  Pulse- Lying BP- Sitting Pulse- Sitting BP- Standing at 0 minutes Pulse- Standing at 0 minutes  12/06/17 1412 140/80 74 122/78 94 140/78 -     GEN:  The patient appears stated age and is in NAD. HEENT:  Normocephalic, atraumatic.  The mucous membranes are moist. The superficial temporal arteries are without ropiness or tenderness. CV:  RRR Lungs:  CTAB Neck/HEME:  There are no carotid bruits bilaterally.  Neurological examination:  Orientation: The patient is alert and oriented x3. Fund of knowledge is appropriate.  Recent and remote memory are intact.  Attention and concentration are normal.    Able to name objects and repeat phrases. Cranial nerves: There is mild L facial droop with ptosis on the L. Pupils are equal round and reactive to light bilaterally. Fundoscopic exam reveals clear margins bilaterally. Extraocular muscles are intact. The visual fields are full to confrontational testing. The speech is fluent, hypophonic, rarely dysarthric (becomes so if speaks quickly). Soft palate rises symmetrically and there is no tongue deviation. Hearing is intact to conversational tone. Sensation: Sensation is intact to light and pinprick throughout (facial, trunk, extremities). Vibration is intact at the bilateral big toe. There is extinction with double simultaneous stimulation on the L. There is no sensory dermatomal level identified. Motor: L arm is held flexed position.  Fingers are flexed on the L but easily passively stretched.  He is able to raise L arm from the shoulder (shoulder shrug) but otherwise strength in the LUE (including deltoid, biceps, triceps) strength  is 0/5 on the LUE.  Strength in the L hip flexors is 3/5 and same for L knee flexors.  He has more trouble with hip extension but strength is still 3-/5.  He has a great deal of trouble with knee extension on the L.  Strength is 5/5 in the right upper and lower extremities.    Deep tendon reflexes: Deep tendon reflexes are 1/4 at the bilateral biceps, triceps, brachioradialis, patella and achilles on the right and 3/4 biceps, triceps, brachioradialis, patella and achilles on the left. Plantar responses are downgoing bilaterally.  Movement examination: Tone: There is spasticity in the LUE/LLE.  I did not appreciate any significant increase in tone today in the RUE Abnormal movements: There is an intermittent RUE resting tremor Coordination:  There is minimal decremation with RAM's, seen with finger taps on the right and  alternation of supination/pronation of the forearm on the right.  Other RAMs are good on the right and unable to do them on the L due to weakness. Gait and Station: The patient has difficulty arising out of a deep-seated chair without the use of the hands and requires help from his wife and the examiner. Once up, he uses the cane and is ataxic, wide based and unsteady.  He drags the L leg a little.      Labs  I reviewed lab work from the primary care physician dated June 14, 2017.  Sodium was 141, potassium 4.3, chloride 106, CO2 30, BUN 15, creatinine 1.04, glucose 139, AST 21, ALT 12, alkaline phosphatase 70.  ASSESSMENT/PLAN:  1.  RUE tremor with hx of large MCA infarct, causing L hemiparesis  -Long discussion with the patient and his wife.  He actually is not very bradykinetic and no significant rigidity on the right, which would defy the criteria for Parkinson's disease.  However, his exam is altered by his history of stroke many years ago and left hemiparesis, which makes evaluation of walking difficult.  Ultimately,  I think it would be reasonable for [place] to consider a DaT scan  given these things.  The patient is very needle phobic, so he is going to think about that.  If positive, then we could go back and give him extended release levodopa instead of immediate release, which is generally a little bit easier to tolerate and perhaps will involve less dizziness.  In addition, his primary care physician has already decreased his blood pressure medication, which may have been contributing to the dizziness.  They will call me tomorrow and let me know if they would like to proceed with DaT.  Much greater than 50% of this visit was spent in counseling and coordinating care.  Total face to face time:  45 min    Cc:  [name], MD

## 2017-12-06 ENCOUNTER — Encounter: Payer: Self-pay | Admitting: Neurology

## 2017-12-06 ENCOUNTER — Ambulatory Visit (INDEPENDENT_AMBULATORY_CARE_PROVIDER_SITE_OTHER): Payer: Medicare Other | Admitting: Neurology

## 2017-12-06 VITALS — BP 118/88

## 2017-12-06 DIAGNOSIS — R251 Tremor, unspecified: Secondary | ICD-10-CM | POA: Diagnosis not present

## 2017-12-06 DIAGNOSIS — I69354 Hemiplegia and hemiparesis following cerebral infarction affecting left non-dominant side: Secondary | ICD-10-CM | POA: Diagnosis not present

## 2017-12-06 NOTE — Patient Instructions (Signed)
We have sent a referral to [facility] for your DAT scan and they will call you directly to schedule your appt.. They are located at [address] If you need to contact them directly please call [phone].

## 2017-12-23 ENCOUNTER — Encounter (HOSPITAL_COMMUNITY)
Admission: RE | Admit: 2017-12-23 | Discharge: 2017-12-23 | Disposition: A | Discharge status: Home / Self Care | Payer: Medicare Other | Source: Ambulatory Visit | Attending: Neurology | Admitting: Neurology

## 2017-12-23 DIAGNOSIS — R251 Tremor, unspecified: Secondary | ICD-10-CM | POA: Insufficient documentation

## 2017-12-23 DIAGNOSIS — I639 Cerebral infarction, unspecified: Secondary | ICD-10-CM | POA: Diagnosis not present

## 2017-12-23 MED ORDER — IODINE STRONG (LUGOLS) 5 % PO SOLN
0.8000 mL | Freq: Once | ORAL | Status: AC
  Administered 2017-12-23: 0.8 mL via ORAL

## 2017-12-23 MED ORDER — IOFLUPANE I 123 185 MBQ/2.5ML IV SOLN
4.6000 | Freq: Once | INTRAVENOUS | Status: AC
  Administered 2017-12-23: 4.6 via INTRAVENOUS

## 2017-12-24 ENCOUNTER — Telehealth: Payer: Self-pay | Admitting: Neurology

## 2017-12-24 NOTE — Telephone Encounter (Signed)
LMOM for patient/wife to call back.

## 2017-12-24 NOTE — Telephone Encounter (Signed)
-----   Message from [place], DO sent at 12/23/2017  4:42 PM EST ----- Reviewed scan and agree.  Jade, let pt/wife know that there was some loss of DA.  You can put him in for follow up at 1pm next friday

## 2017-12-24 NOTE — Telephone Encounter (Signed)
Appt made 12/31/17 at 1:00 pm. Will call patient later to discuss.

## 2017-12-27 NOTE — Telephone Encounter (Signed)
LMOM for patient's wife to call back and confirm they can come to the office on Friday at 1:00 pm to discuss DAT scan results.

## 2017-12-28 ENCOUNTER — Telehealth: Payer: Self-pay | Admitting: Neurology

## 2017-12-28 NOTE — Telephone Encounter (Signed)
Pt's spouse called and left a VM message wanting the DAT Scan results

## 2017-12-28 NOTE — Telephone Encounter (Signed)
Everytime I call it goes straight to vm. I have left two messages for them to confirm appt I made for this Friday to go over DAT results. If patient/wife calls back please confirm they can make this appt.

## 2017-12-28 NOTE — Progress Notes (Signed)
Robert [name] was seen today in the movement disorders clinic for neurologic consultation at the request of [name], MD.  The consultation is for the evaluation of 2nd opinion re: PD.  Pt currently under the care of [name]. The records that were made available to me were reviewed.  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 in July, 2018 for the evaluation of tremor.  He was diagnosed with Parkinson's disease in July, 2018 and started on carbidopa/levodopa 25/100 and was to work to 1 tablet 3 times per day.  He called after starting the medication because of dizziness and what sounds like some cognitive change and the dose was decreased from 1 tablet 3 times per day to half a tablet 3 times per day.  They called Dr. [name] office in January, 2019 to state that when they tried to increase the medication, he got lightheaded again.  He was told to discontinue the levodopa and to ask his primary care physician for another opinion, which is the reason he presents today.  He does state that the lightheadedness is better now that off carbidopa/levodopa 25/100 but also states that his BP medication was cut back the same day , from 50 mg to 25 mg  Specific Symptoms:  Tremor: Yes.  , R arm and R leg a little Family hx of similar:  No.  Voice: softer Sleep:   Vivid Dreams:  No.  Acting out dreams:  No. Wet Pillows: No. Postural symptoms:  Yes.  , but related to stroke 10 years ago and balance change has been stable over 5+ years.  Stroke was over 10 years ago  Falls?  No. Bradykinesia symptoms: difficulty getting out of a chair, due to stroke per patient/wife Loss of smell:  No. Loss of taste:  No. Urinary Incontinence:  No. - gets nocturia 1-2 times per night Difficulty Swallowing:  Yes.  , mostly with saliva.  Has to swallow liquid slow but not different since stroke.   Handwriting, micrographia: Yes.    Trouble with ADL's:  Yes.  , because of stroke   Trouble buttoning clothing: Yes.   Depression:  No. Memory changes:  Yes.  , some short term memory Hallucinations:  No.  visual distortions: Yes.   N/V:  No. Lightheaded:  Yes.   - better right now after d/c of carbidopa/levodopa  Syncope: No. Diplopia:  No. Dyskinesia:  No.   MRI of the brain from June, 2018 was personally reviewed by me.  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  12/31/17 update: The patient is seen today in follow-up.  The patient had a DaT scan on December 23, 2017.  I have reviewed these images.  This demonstrated decreased activity within the left putamen.  There was absent activity in the right basal ganglia, related to his prior large MCA infarct.  Wife also notes that the patient is having trouble sleeping  PREVIOUS MEDICATIONS: Sinemet  ALLERGIES:   Allergies  Allergen Reactions  . Propofol Other (See Comments)    "hiccups for weeks"    CURRENT MEDICATIONS:  Outpatient Encounter Medications as of 12/31/2017  Medication Sig  . aspirin 81 MG tablet Take 81 mg by mouth daily.  Marland Kitchen losartan (COZAAR) 50 MG tablet Take 25 mg by mouth every morning.   . polyethylene glycol (MIRALAX) packet Take 17 g by mouth daily.  . simvastatin (ZOCOR)  20 MG tablet Take 20 mg by mouth every evening.  . Carbidopa-Levodopa ER (SINEMET CR) 25-100 MG tablet controlled release Take 1 tablet by mouth 3 (three) times daily.   No facility-administered encounter medications on file as of 12/31/2017.     PAST MEDICAL HISTORY:   Past Medical History:  Diagnosis Date  . CVA (cerebral vascular accident) ([place])    with left sided hemiparesis  . Diverticulosis   . Frequency of urination   . GERD (gastroesophageal reflux disease)   . Gross hematuria   . History of acute pyelonephritis    10-13-2012  . History of CVA with residual deficit    2008--  left side of body weakness and foot drop (wears leg brace and uses cane)  .  History of DVT of lower extremity    2008--  cva  . Hypercholesteremia   . Hyperlipidemia   . Hyperlipidemia   . Hypertension   . 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 Left leg DVT ([place])   . 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 Urethral stricture   . Urgency of urination   . Weakness of left side of body    secondary to cva 2008  . Wears glasses   . Wears hearing aid    bilateral-- wears intermittantly    PAST SURGICAL HISTORY:   Past Surgical History:  Procedure Laterality Date  . CYSTOSCOPY WITH RETROGRADE URETHROGRAM N/A 10/23/2015   Procedure: CYSTOSCOPY WITH RETROGRADE URETHROGRAM;  Surgeon: [name], MD;  Location: [facility];  Service: Urology;  Laterality: N/A;  . CYSTOSCOPY WITH URETHRAL DILATATION N/A 10/23/2015   Procedure: CYSTOSCOPY WITH URETHRAL BALLOON DILATATION;  Surgeon: [name], MD;  Location: [facility];  Service: Urology;  Laterality: N/A;  BALLOON DILATION   . IVC FILTER PLACEMENT ([place] HX)  2008    SOCIAL HISTORY:   Social History   Socioeconomic History  . Marital status: Married    Spouse name: Not on file  . Number of children: Not on file  . Years of education: Not on file  . Highest education level: Not on file  Social Needs  . Financial resource strain: Not on file  . Food insecurity - worry: Not on file  . Food insecurity - inability: Not on file  . Transportation needs - medical: Not on file  . Transportation needs - non-medical: Not on file  Occupational History  . Occupation: retired    Comment: Public librarian  Tobacco Use  . Smoking status: Former Smoker    Years: 10.00    Types: Cigarettes    Last attempt to quit: 10/26/1970    Years since quitting: 47.2  . Smokeless tobacco: Never Used  Substance and Sexual Activity  . Alcohol use: No  . Drug use: No  . Sexual activity: Not on file  Other Topics Concern  . Not on file  Social History Narrative    Lives in [place] with wife. He is a English as a second language teacher.   Has one son who lives in [place]. He is healthy.   [facility] [place], [place].    FAMILY HISTORY:   Family Status  Relation Name Status  . Sister 3 Alive  . Brother 4 Alive  . Mother  Deceased  . Father  Deceased  . Son  Alive    ROS:  A complete 10 system review of systems was obtained  and was unremarkable apart from what is mentioned above.  PHYSICAL EXAMINATION:    VITALS:   Vitals:   12/31/17 1258  BP: 130/76  Pulse: 72  SpO2: 95%   No data found.   GEN:  The patient appears stated age and is in NAD. HEENT:  Normocephalic, atraumatic.  The mucous membranes are moist. The superficial temporal arteries are without ropiness or tenderness. CV:  RRR Lungs:  CTAB Neck/HEME:  There are no carotid bruits bilaterally.  Neurological examination:  Orientation: The patient is alert and oriented x3.  Cranial nerves: There is mild L facial droop with ptosis on the L.   The speech is fluent, hypophonic, rarely dysarthric (becomes so if speaks quickly). Soft palate rises symmetrically and there is no tongue deviation. Hearing is intact to conversational tone. Sensation: Sensation is intact to light touch x 4 Motor: L arm is held flexed position.  Fingers are flexed on the L but easily passively stretched.  He is able to raise L arm from the shoulder (shoulder shrug) but otherwise strength in the LUE (including deltoid, biceps, triceps) strength is 0/5 on the LUE.  Strength in the L hip flexors is 3/5 and same for L knee flexors.  He has more trouble with hip extension but strength is still 3-/5.  He has a great deal of trouble with knee extension on the L.  Strength is 5/5 in the right upper and lower extremities.    Deep tendon reflexes: Deep tendon reflexes are 1/4 at the bilateral biceps, triceps, brachioradialis, patella and achilles on the right and 3/4 biceps, triceps, brachioradialis, patella and achilles  on the left. Plantar responses are downgoing bilaterally.  Movement examination: Tone: There is spasticity in the LUE/LLE.  Today, I could feel mild cogwheel on the right arm Abnormal movements: There was no rest tremor of the R arm noted today Coordination:  There is minimal decremation with RAM's, seen with finger taps on the right and  alternation of supination/pronation of the forearm on the right.  Other RAMs are good on the right and unable to do them on the L due to weakness. Gait and Station: The patient was in [facility] and not ambulated today  Labs  I reviewed lab work from the primary care physician dated June 14, 2017.  Sodium was 141, potassium 4.3, chloride 106, CO2 30, BUN 15, creatinine 1.04, glucose 139, AST 21, ALT 12, alkaline phosphatase 70.  ASSESSMENT/PLAN:  1.  RUE tremor with hx of large MCA infarct, causing L hemiparesis  -The patient had a DaT scan on December 15, 2017 which was positive for loss of dopamine on the left (and loss of dopamine on the right due to prior infarct).  He and I discussed that this is not a diagnostic scan.  He was unable to tolerate immediate release levodopa in the past due to dizziness.  We talked about extended release medication.  Return event risk, benefits, and side effects.  Will start slowly and work to carbidopa levodopa 25/100 CR, 1 tablet at 10 AM (wake time), 1 tablet at 2 PM and 1 tablet at 6 PM.  Talked about potential interactions with protein.  -Talked about the importance of exercise.  His wife works and they state that he cannot get out of the house.  I asked them if it would be helpful if we signed him up for the scat bus, but ultimately his wife did not want to do that.  She stated that she did  not feel comfortable with someone else around the home, even if just to pick him up, and felt that he would not remember to lock the door.  2.  Sleep difficulty  -Really did not want to add 2 medications at once.  I told them to try  melatonin, 3 mg at night.  3.  Follow up is anticipated in the next few months, sooner should new neurologic issues arise.  Much greater than 50% of this visit was spent in counseling and coordinating care.  Total face to face time:  25 min    Cc:  [name], MD

## 2017-12-31 ENCOUNTER — Encounter: Payer: Self-pay | Admitting: Neurology

## 2017-12-31 ENCOUNTER — Ambulatory Visit (INDEPENDENT_AMBULATORY_CARE_PROVIDER_SITE_OTHER): Payer: Medicare Other | Admitting: Neurology

## 2017-12-31 VITALS — BP 130/76 | HR 72

## 2017-12-31 DIAGNOSIS — G4709 Other insomnia: Secondary | ICD-10-CM

## 2017-12-31 DIAGNOSIS — G20C Parkinsonism, unspecified: Secondary | ICD-10-CM

## 2017-12-31 DIAGNOSIS — G20 Parkinson's disease: Secondary | ICD-10-CM | POA: Diagnosis not present

## 2017-12-31 DIAGNOSIS — I69354 Hemiplegia and hemiparesis following cerebral infarction affecting left non-dominant side: Secondary | ICD-10-CM | POA: Diagnosis not present

## 2017-12-31 MED ORDER — CARBIDOPA-LEVODOPA ER 25-100 MG PO TBCR: 1.0000 | EXTENDED_RELEASE_TABLET | Freq: Three times a day (TID) | ORAL | 3 refills | Status: DC

## 2017-12-31 NOTE — Patient Instructions (Addendum)
1.  Start carbidopa/levodopa CR at 10 am for a week; after 1 week take carbidopa/levodopa 25/100 CR at 10 am and one at 6pm; after that start carbidopa/levodopa 25/100 CR at 10am/2pm/6pm  2.  Start melatonin, 3 mg, 30 min to one hour before bedtime

## 2018-01-31 ENCOUNTER — Telehealth: Payer: Self-pay | Admitting: Neurology

## 2018-01-31 NOTE — Telephone Encounter (Signed)
Pt's wife called in, she c/a 5/22 appt with Dr Rexene Alberts- pt is seeing Dr Minus Liberty. Pt's wife said it was per Dr Guadelupe Sabin referral but Cyril Mourning nor I did not see a documented referral

## 2018-02-10 ENCOUNTER — Ambulatory Visit: Payer: Medicare Other | Admitting: Neurology

## 2018-02-16 ENCOUNTER — Telehealth: Payer: Self-pay | Admitting: Neurology

## 2018-02-16 NOTE — Telephone Encounter (Signed)
1.     Name of medication: Carbadopa  2.     How are you currently taking this medication (dosage and times per day)? 3x a day 25-100mg   3.     Are you having a reaction (difficulty breathing--STAT)   4.     What is your medication issue? Tremors are worse and wants to know if medication should be increased

## 2018-02-17 ENCOUNTER — Telehealth: Payer: Self-pay | Admitting: Neurology

## 2018-02-17 NOTE — Telephone Encounter (Signed)
Tried to call back to discuss. Went straight to vm. Mailbox full.

## 2018-02-17 NOTE — Telephone Encounter (Signed)
Wife called and Lmom needing to speak with you regarding his medication and Tremors being Severe. Please Call [phone]. Thanks

## 2018-02-17 NOTE — Telephone Encounter (Signed)
Again, tried to call back and no answer, vm full.

## 2018-02-18 NOTE — Telephone Encounter (Signed)
If patient's wife calls back I need a more reliable form of communication to get back in contact with her.

## 2018-02-18 NOTE — Telephone Encounter (Signed)
Patient's wife called back again today and Lmom. I tried as well to call her and it goes straight to voicemail and voicemail is full. Thanks

## 2018-02-21 ENCOUNTER — Telehealth: Payer: Self-pay | Admitting: Neurology

## 2018-02-21 NOTE — Telephone Encounter (Signed)
Patient's wife came by the office and needed to speak with you. I told her we have tried to reach her for a couple of days but her phone went Voicemail but mailbox was full. She asked could you try to reach her at her Work # [phone] and ask for [employer]. Thanks

## 2018-02-22 NOTE — Telephone Encounter (Signed)
LMOM letting patient's wife know. She is to call with any questions.

## 2018-02-22 NOTE — Telephone Encounter (Signed)
Spoke with patient's wife.  She states patient still having bad tremors. He "feels jittery". States he is still having slow movements. Still stiff. Sleeping okay. No side effects from meds. He is taking Carbidopa Levodopa 25/100 CR - 1 TID.  Please advise.

## 2018-02-22 NOTE — Telephone Encounter (Signed)
Tried to call her at work. They stated that she was picking up the phone, then went back to a phone tree. I will await her call.

## 2018-02-22 NOTE — Telephone Encounter (Signed)
Can increase to 2/1/1

## 2018-03-02 DIAGNOSIS — Z0000 Encounter for general adult medical examination without abnormal findings: Secondary | ICD-10-CM | POA: Diagnosis not present

## 2018-03-02 DIAGNOSIS — I10 Essential (primary) hypertension: Secondary | ICD-10-CM | POA: Diagnosis not present

## 2018-03-02 DIAGNOSIS — G20 Parkinson's disease: Secondary | ICD-10-CM | POA: Diagnosis not present

## 2018-03-02 DIAGNOSIS — E785 Hyperlipidemia, unspecified: Secondary | ICD-10-CM | POA: Diagnosis not present

## 2018-03-02 DIAGNOSIS — E119 Type 2 diabetes mellitus without complications: Secondary | ICD-10-CM | POA: Diagnosis not present

## 2018-03-02 DIAGNOSIS — G8190 Hemiplegia, unspecified affecting unspecified side: Secondary | ICD-10-CM | POA: Diagnosis not present

## 2018-03-16 ENCOUNTER — Ambulatory Visit: Payer: Medicare Other | Admitting: Neurology

## 2018-03-29 DIAGNOSIS — N35011 Post-traumatic bulbous urethral stricture: Secondary | ICD-10-CM | POA: Diagnosis not present

## 2018-03-29 DIAGNOSIS — N3000 Acute cystitis without hematuria: Secondary | ICD-10-CM | POA: Diagnosis not present

## 2018-04-01 NOTE — Progress Notes (Signed)
Robert [name] was seen today in the movement disorders clinic for neurologic consultation at the request of [name], MD.  The consultation is for the evaluation of 2nd opinion re: PD.  Pt currently under the care of [name]. The records that were made available to me were reviewed.  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 in July, 2018 for the evaluation of tremor.  He was diagnosed with Parkinson's disease in July, 2018 and started on carbidopa/levodopa 25/100 and was to work to 1 tablet 3 times per day.  He called after starting the medication because of dizziness and what sounds like some cognitive change and the dose was decreased from 1 tablet 3 times per day to half a tablet 3 times per day.  They called Dr. [name] office in January, 2019 to state that when they tried to increase the medication, he got lightheaded again.  He was told to discontinue the levodopa and to ask his primary care physician for another opinion, which is the reason he presents today.  He does state that the lightheadedness is better now that off carbidopa/levodopa 25/100 but also states that his BP medication was cut back the same day , from 50 mg to 25 mg  Specific Symptoms:  Tremor: Yes.  , R arm and R leg a little Family hx of similar:  No.  Voice: softer Sleep:   Vivid Dreams:  No.  Acting out dreams:  No. Wet Pillows: No. Postural symptoms:  Yes.  , but related to stroke 10 years ago and balance change has been stable over 5+ years.  Stroke was over 10 years ago  Falls?  No. Bradykinesia symptoms: difficulty getting out of a chair, due to stroke per patient/wife Loss of smell:  No. Loss of taste:  No. Urinary Incontinence:  No. - gets nocturia 1-2 times per night Difficulty Swallowing:  Yes.  , mostly with saliva.  Has to swallow liquid slow but not different since stroke.   Handwriting, micrographia: Yes.    Trouble with ADL's:  Yes.  , because of stroke   Trouble buttoning clothing: Yes.   Depression:  No. Memory changes:  Yes.  , some short term memory Hallucinations:  No.  visual distortions: Yes.   N/V:  No. Lightheaded:  Yes.   - better right now after d/c of carbidopa/levodopa  Syncope: No. Diplopia:  No. Dyskinesia:  No.   MRI of the brain from June, 2018 was personally reviewed by me.  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  12/31/17 update: The patient is seen today in follow-up.  The patient had a DaT scan on December 23, 2017.  I have reviewed these images.  This demonstrated decreased activity within the left putamen.  There was absent activity in the right basal ganglia, related to his prior large MCA infarct.  Wife also notes that the patient is having trouble sleeping  04/05/18 update: Patient seen today in follow-up.  Patient was started on carbidopa/levodopa 25/100 CR, 1 tablet 3 times per day since our last visit.  His wife called and stated that he was still quite tremulous.  Medication was increased to 2 tablets in the morning, 1 in the afternoon and 1 in the evening.  They didn't make this increase and he is still on carbidopa/levodopa 25/100 CR tid.  They have trouble deciphering whether medication is helpful.  Prior to this is because he is always slow because of prior history of large right MCA infarct.  He still has tremor on the right hand, which is really the only thing they can go by.  PREVIOUS MEDICATIONS: Sinemet  ALLERGIES:   Allergies  Allergen Reactions  . Propofol Other (See Comments)    "hiccups for weeks"    CURRENT MEDICATIONS:  Outpatient Encounter Medications as of 04/05/2018  Medication Sig  . aspirin 81 MG tablet Take 81 mg by mouth daily.  . Carbidopa-Levodopa ER (SINEMET CR) 25-100 MG tablet controlled release 2 at 10 am, 2 at 2pm, 1 at 6pm  . losartan (COZAAR) 50 MG tablet Take 25 mg by mouth every morning.   . polyethylene glycol  (MIRALAX) packet Take 17 g by mouth daily.  . simvastatin (ZOCOR) 20 MG tablet Take 20 mg by mouth every evening.  . [DISCONTINUED] Carbidopa-Levodopa ER (SINEMET CR) 25-100 MG tablet controlled release Take 1 tablet by mouth 3 (three) times daily.   No facility-administered encounter medications on file as of 04/05/2018.     PAST MEDICAL HISTORY:   Past Medical History:  Diagnosis Date  . CVA (cerebral vascular accident) ([place])    with left sided hemiparesis  . Diverticulosis   . Frequency of urination   . GERD (gastroesophageal reflux disease)   . Gross hematuria   . History of acute pyelonephritis    10-13-2012  . History of CVA with residual deficit    2008--  left side of body weakness and foot drop (wears leg brace and uses cane)  . History of DVT of lower extremity    2008--  cva  . Hypercholesteremia   . Hyperlipidemia   . Hyperlipidemia   . Hypertension   . 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 Left leg DVT ([place])   . 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 Urethral stricture   . Urgency of urination   . Weakness of left side of body    secondary to cva 2008  . Wears glasses   . Wears hearing aid    bilateral-- wears intermittantly    PAST SURGICAL HISTORY:   Past Surgical History:  Procedure Laterality Date  . CYSTOSCOPY WITH RETROGRADE URETHROGRAM N/A 10/23/2015   Procedure: CYSTOSCOPY WITH RETROGRADE URETHROGRAM;  Surgeon: [name], MD;  [facility];  Service: Urology;  Laterality: N/A;  . CYSTOSCOPY WITH URETHRAL DILATATION N/A 10/23/2015   Procedure: CYSTOSCOPY WITH URETHRAL BALLOON DILATATION;  Surgeon: [name], MD;  Location: [facility];  Service: Urology;  Laterality: N/A;  BALLOON DILATION   . IVC FILTER PLACEMENT ([place] HX)  2008    SOCIAL HISTORY:   Social History   Socioeconomic History  . Marital status: Married    Spouse name: Not on file  . Number of children: Not  on file  . Years of education: Not on file  . Highest education level: Not on file  Occupational History  . Occupation: retired    Comment: Public librarian  Social Needs  . Financial resource strain: Not on file  . Food insecurity:    Worry: Not on file    Inability: Not on file  . Transportation needs:    Medical: Not on file    Non-medical: Not on file  Tobacco Use  . Smoking status: Former Smoker    Years: 10.00    Types: Cigarettes  Last attempt to quit: 10/26/1970    Years since quitting: 47.4  . Smokeless tobacco: Never Used  Substance and Sexual Activity  . Alcohol use: No  . Drug use: No  . Sexual activity: Not on file  Lifestyle  . Physical activity:    Days per week: Not on file    Minutes per session: Not on file  . Stress: Not on file  Relationships  . Social connections:    Talks on phone: Not on file    Gets together: Not on file    Attends religious service: Not on file    Active member of club or organization: Not on file    Attends meetings of clubs or organizations: Not on file    Relationship status: Not on file  . Intimate partner violence:    Fear of current or ex partner: Not on file    Emotionally abused: Not on file    Physically abused: Not on file    Forced sexual activity: Not on file  Other Topics Concern  . Not on file  Social History Narrative   Lives in [place] with wife. He is a English as a second language teacher.   Has one son who lives in [place]. He is healthy.   [facility] [place], [place].    FAMILY HISTORY:   Family Status  Relation Name Status  . Sister 3 Alive  . Brother 4 Alive  . Mother  Deceased  . Father  Deceased  . Son  Alive    ROS:  Review of Systems  Constitutional: Negative.   HENT: Negative.   Eyes: Negative.   Respiratory: Negative.   Cardiovascular: Negative.   Gastrointestinal: Negative.   Skin: Negative.   Neurological: Positive for focal weakness (chronic, L sided due to previous stroke).     PHYSICAL EXAMINATION:    VITALS:   Vitals:   04/05/18 1518  BP: 100/60  Pulse: 87  SpO2: 97%  Weight: 227 lb 4 oz (103.1 kg)  Height: 6\' 2"  (1.88 m)   No data found.   GEN:  The patient appears stated age and is in NAD. HEENT:  Normocephalic, atraumatic.  The mucous membranes are moist. The superficial temporal arteries are without ropiness or tenderness. CV:  RRR Lungs:  CTAB Neck/HEME:  There are no carotid bruits bilaterally.  Neurological examination:  Orientation: The patient is alert and oriented x3.  Cranial nerves: There is mild L facial droop with ptosis on the L.   The speech is fluent, hypophonic, rarely dysarthric (becomes so if speaks quickly). Soft palate rises symmetrically and there is no tongue deviation. Hearing is intact to conversational tone. Sensation: Sensation is intact to light touch x 4 Motor: L arm is held flexed position.  Fingers are flexed on the L but easily passively stretched.  He is able to raise L arm from the shoulder (shoulder shrug) but otherwise strength in the LUE (including deltoid, biceps, triceps) strength is 0/5 on the LUE.  Strength in the L hip flexors is 3/5 and same for L knee flexors.  He has more trouble with hip extension but strength is still 3-/5.  He has a great deal of trouble with knee extension on the L.  Strength is 5/5 in the right upper and lower extremities.    Deep tendon reflexes: Deep tendon reflexes are 1/4 at the bilateral biceps, triceps, brachioradialis, patella and achilles on the right and 3/4 biceps, triceps, brachioradialis, patella and achilles on the left.  Plantar responses are downgoing bilaterally.  Movement examination: Tone: There is spasticity in the LUE/LLE.  No rigidity noted on the right Abnormal movements: There was mild RUE resting tremor. Coordination:  There is minimal decremation with RAM's, seen with finger taps on the right and  alternation of supination/pronation of the forearm on the right.   Other RAMs are good on the right and unable to do them on the L due to weakness. Gait and Station: Patient slowly scoots to the edge of the transport chair and is able to get up with the use of a cane.  He is slow, but does not really shuffle.  Gait is somewhat antalgic.  He is unsteady.  Labs  I reviewed lab work from the primary care physician dated June 14, 2017.  Sodium was 141, potassium 4.3, chloride 106, CO2 30, BUN 15, creatinine 1.04, glucose 139, AST 21, ALT 12, alkaline phosphatase 70.  ASSESSMENT/PLAN:  1.  RUE tremor with hx of large MCA infarct, causing L hemiparesis  -The patient had a DaT scan on December 15, 2017 which was positive for loss of dopamine on the left (and loss of dopamine on the right due to prior infarct).  He and I discussed that this is not a diagnostic scan.  He was unable to tolerate immediate release levodopa in the past due to dizziness.  He is on extended release now and tolerating it well.  We will slightly increase to 2/2/1.  If they noticed no benefit, we may discontinue it and try a pure tremor medication, as that is the biggest issue he has.  2.  Follow up is anticipated in the next 5 months, sooner should new neurologic issues arise.     Cc:  [name], MD

## 2018-04-05 ENCOUNTER — Ambulatory Visit (INDEPENDENT_AMBULATORY_CARE_PROVIDER_SITE_OTHER): Payer: Medicare Other | Admitting: Neurology

## 2018-04-05 ENCOUNTER — Encounter: Payer: Self-pay | Admitting: Neurology

## 2018-04-05 VITALS — BP 100/60 | HR 87 | Ht 74.0 in | Wt 227.2 lb

## 2018-04-05 DIAGNOSIS — I69354 Hemiplegia and hemiparesis following cerebral infarction affecting left non-dominant side: Secondary | ICD-10-CM

## 2018-04-05 DIAGNOSIS — G20 Parkinson's disease: Secondary | ICD-10-CM | POA: Diagnosis not present

## 2018-04-05 MED ORDER — CARBIDOPA-LEVODOPA ER 25-100 MG PO TBCR: EXTENDED_RELEASE_TABLET | ORAL | 4 refills | Status: DC

## 2018-04-05 NOTE — Patient Instructions (Signed)
Increase carbidopa/levodopa 25/100, to 2 tablets at 10 am, 1 at 2 pm, 1 at 6 pm for 2 weeks and then increase to 2tablets at 10 am, 2 at 2 pm, 1 at 6pm

## 2018-05-02 ENCOUNTER — Telehealth: Payer: Self-pay | Admitting: Neurology

## 2018-05-02 NOTE — Telephone Encounter (Signed)
Patient's wife called and said that since Barrett's medication (Carbidopa Levodopa) had been increased, his muscles have been Achy. She wanted to know could that be related to the increase in mediation? Please Call. Thanks

## 2018-05-02 NOTE — Telephone Encounter (Signed)
LMOM letting her know muscle aching wouldn't be caused by increase in medication. Medication should be helping muscles feel less rigid. She is to call back with any questions.

## 2018-06-29 DIAGNOSIS — L723 Sebaceous cyst: Secondary | ICD-10-CM | POA: Diagnosis not present

## 2018-07-15 ENCOUNTER — Encounter: Payer: Self-pay | Admitting: Neurology

## 2018-08-22 DIAGNOSIS — Z800 Family history of malignant neoplasm of digestive organs: Secondary | ICD-10-CM | POA: Diagnosis not present

## 2018-08-22 DIAGNOSIS — K5900 Constipation, unspecified: Secondary | ICD-10-CM | POA: Diagnosis not present

## 2018-08-22 NOTE — Progress Notes (Signed)
Robert [name] was seen today in the movement disorders clinic for neurologic consultation at the request of Robert Pepper, MD.  The consultation is for the evaluation of 2nd opinion re: PD.  Pt currently under the care of Dr. Rexene [name]. The records that were made available to me were reviewed.  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 Rexene [name] in July, 2018 for the evaluation of tremor.  He was diagnosed with Parkinson's disease in July, 2018 and started on carbidopa/levodopa 25/100 and was to work to 1 tablet 3 times per day.  He called after starting the medication because of dizziness and what sounds like some cognitive change and the dose was decreased from 1 tablet 3 times per day to half a tablet 3 times per day.  They called Dr. [name] office in January, 2019 to state that when they tried to increase the medication, he got lightheaded again.  He was told to discontinue the levodopa and to ask his primary care physician for another opinion, which is the reason he presents today.  He does state that the lightheadedness is better now that off carbidopa/levodopa 25/100 but also states that his BP medication was cut back the same day , from 50 mg to 25 mg  Specific Symptoms:  Tremor: Yes.  , R arm and R leg a little Family hx of similar:  No.  Voice: softer Sleep:   Vivid Dreams:  No.  Acting out dreams:  No. Wet Pillows: No. Postural symptoms:  Yes.  , but related to stroke 10 years ago and balance change has been stable over 5+ years.  Stroke was over 10 years ago  Falls?  No. Bradykinesia symptoms: difficulty getting out of a chair, due to stroke per patient/wife Loss of smell:  No. Loss of taste:  No. Urinary Incontinence:  No. - gets nocturia 1-2 times per night Difficulty Swallowing:  Yes.  , mostly with saliva.  Has to swallow liquid slow but not different since stroke.   Handwriting, micrographia: Yes.    Trouble with ADL's:  Yes.  , because of stroke   Trouble buttoning clothing: Yes.   Depression:  No. Memory changes:  Yes.  , some short term memory Hallucinations:  No.  visual distortions: Yes.   N/V:  No. Lightheaded:  Yes.   - better right now after d/c of carbidopa/levodopa  Syncope: No. Diplopia:  No. Dyskinesia:  No.   MRI of the brain from June, 2018 was personally reviewed by me.  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  12/31/17 update: The patient is seen today in follow-up.  The patient had a DaT scan on December 23, 2017.  I have reviewed these images.  This demonstrated decreased activity within the left putamen.  There was absent activity in the right basal ganglia, related to his prior large MCA infarct.  Wife also notes that the patient is having trouble sleeping  04/05/18 update: Patient seen today in follow-up.  Patient was started on carbidopa/levodopa 25/100 CR, 1 tablet 3 times per day since our last visit.  His wife called and stated that he was still quite tremulous.  Medication was increased to 2 tablets in the morning, 1 in the afternoon and 1 in the evening.  They didn't make this increase and he is still on carbidopa/levodopa 25/100 CR tid.  They have trouble deciphering whether medication is helpful.  Prior to this is because he is always slow because of prior history of large right MCA infarct.  He still has tremor on the right hand, which is really the only thing they can go by.  08/23/18 update:  Pt seen in f/u for tremor and hx of MCA infarct.  This patient is accompanied in the office by his spouse who supplements the history.  He is on carbidopa/levodopa 25/100 CR, 2 in the AM, 2 in the afternoon and 1 in the evening.  He reports that there are times in the day, usually mid day, he is not shaking.  Not sure if medication helps.  Biggest time it is bothersome is when goes to bed at night.  Doesn't want to change medication because overall "doing well."  No  falls. Occasionally will have a hallucination but if closes eye will go away.  Arises out of sleep.  Will wake up and then see someone and then close eyes and open again and it will be gone.  Never happens in the day.  PREVIOUS MEDICATIONS: Sinemet  ALLERGIES:   Allergies  Allergen Reactions  . Propofol Other (See Comments)    "hiccups for weeks"    CURRENT MEDICATIONS:  Outpatient Encounter Medications as of 08/23/2018  Medication Sig  . aspirin 81 MG tablet Take 81 mg by mouth daily.  . Carbidopa-Levodopa ER (SINEMET CR) 25-100 MG tablet controlled release 2 at 10 am, 2 at 2pm, 1 at 6pm  . losartan (COZAAR) 50 MG tablet Take 25 mg by mouth every morning.   . polyethylene glycol (MIRALAX) packet Take 17 g by mouth daily.  . simvastatin (ZOCOR) 20 MG tablet Take 20 mg by mouth every evening.   No facility-administered encounter medications on file as of 08/23/2018.     PAST MEDICAL HISTORY:   Past Medical History:  Diagnosis Date  . CVA (cerebral vascular accident) ([place])    with left sided hemiparesis  . Diverticulosis   . Frequency of urination   . GERD (gastroesophageal reflux disease)   . Gross hematuria   . History of acute pyelonephritis    10-13-2012  . History of CVA with residual deficit    2008--  left side of body weakness and foot drop (wears leg brace and uses cane)  . History of DVT of lower extremity    2008--  cva  . Hypercholesteremia   . Hyperlipidemia   . Hyperlipidemia   . Hypertension   . 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 Left leg DVT ([place])   . 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 Urethral stricture   . Urgency of urination   . Weakness of left side of body    secondary to cva 2008  . Wears glasses   . Wears hearing aid    bilateral-- wears intermittantly    PAST SURGICAL HISTORY:   Past Surgical History:  Procedure Laterality Date  . CYSTOSCOPY WITH RETROGRADE URETHROGRAM N/A 10/23/2015   Procedure:  CYSTOSCOPY WITH RETROGRADE URETHROGRAM;  Surgeon: [name], MD;  Location: [facility];  Service: Urology;  Laterality: N/A;  . CYSTOSCOPY WITH URETHRAL DILATATION N/A 10/23/2015   Procedure: CYSTOSCOPY WITH URETHRAL BALLOON DILATATION;  Surgeon: [name], MD;  Location: [facility];  Service: Urology;  Laterality: N/A;  BALLOON DILATION   . IVC FILTER PLACEMENT ([place] [facility])  2008    SOCIAL HISTORY:   Social History  Socioeconomic History  . Marital status: Married    Spouse name: Not on file  . Number of children: Not on file  . Years of education: Not on file  . Highest education level: Not on file  Occupational History  . Occupation: retired    Comment: Public librarian  Social Needs  . Financial resource strain: Not on file  . Food insecurity:    Worry: Not on file    Inability: Not on file  . Transportation needs:    Medical: Not on file    Non-medical: Not on file  Tobacco Use  . Smoking status: Former Smoker    Years: 10.00    Types: Cigarettes    Last attempt to quit: 10/26/1970    Years since quitting: 47.8  . Smokeless tobacco: Never Used  Substance and Sexual Activity  . Alcohol use: No  . Drug use: No  . Sexual activity: Not on file  Lifestyle  . Physical activity:    Days per week: Not on file    Minutes per session: Not on file  . Stress: Not on file  Relationships  . Social connections:    Talks on phone: Not on file    Gets together: Not on file    Attends religious service: Not on file    Active member of club or organization: Not on file    Attends meetings of clubs or organizations: Not on file    Relationship status: Not on file  . Intimate partner violence:    Fear of current or ex partner: Not on file    Emotionally abused: Not on file    Physically abused: Not on file    Forced sexual activity: Not on file  Other Topics Concern  . Not on file  Social History Narrative   Lives in [place] with wife.  He is a English as a second language teacher.   Has one son who lives in [place]. He is healthy.   [facility] [place] [place].    FAMILY HISTORY:   Family Status  Relation Name Status  . Sister 3 Alive  . Brother 4 Alive  . Mother  Deceased  . Father  Deceased  . Son  Alive    ROS:  Review of Systems  Constitutional: Positive for malaise/fatigue.  HENT: Negative.   Eyes: Negative.   Respiratory: Negative.   Cardiovascular: Negative.   Gastrointestinal: Negative.   Genitourinary: Negative.   Musculoskeletal: Negative.   Skin: Negative.   Endo/Heme/Allergies: Negative.     PHYSICAL EXAMINATION:    VITALS:   Vitals:   08/23/18 0844  BP: 136/70  Pulse: 76  SpO2: 96%  Weight: 230 lb (104.3 kg)  Height: 6\' 2"  (1.88 m)   No data found.   GEN:  The patient appears stated age and is in NAD. HEENT:  Normocephalic, atraumatic.  The mucous membranes are moist. The superficial temporal arteries are without ropiness or tenderness. CV:  RRR Lungs:  CTAB Neck/HEME:  There are no carotid bruits bilaterally.  Neurological examination:  Orientation: The patient is alert and oriented x3.  Cranial nerves: There is mild L facial droop with ptosis on the L.   The speech is fluent, hypophonic, rarely dysarthric (becomes so if speaks quickly). Soft palate rises symmetrically and there is no tongue deviation. Hearing is intact to conversational tone. Sensation: Sensation is intact to light touch x 4 Motor: L arm is held flexed position.  Fingers are flexed on the L but  easily passively stretched.  He is able to raise L arm from the shoulder (shoulder shrug) but otherwise strength in the LUE (including deltoid, biceps, triceps) strength is 0/5 on the LUE.  Strength in the L hip flexors is 3/5 and same for L knee flexors.  He has more trouble with hip extension but strength is still 3-/5.  He has a great deal of trouble with knee extension on the L.  Strength is 5/5 in the right upper and lower  extremities.    Deep tendon reflexes: Deep tendon reflexes are 1/4 at the bilateral biceps, triceps, brachioradialis, patella and achilles on the right and 3/4 biceps, triceps, brachioradialis, patella and achilles on the left. Plantar responses are downgoing bilaterally.  Movement examination: Tone: There is spasticity in the LUE/LLE. There is mild rigidity today in the right upper extremity but he hasn't taken mediation Abnormal movements: There was mild RUE resting tremor. Coordination:  There is mild decremation, with any form of RAMS, including alternating supination and pronation of the forearm, hand opening and closing, finger taps, heel taps and toe taps on the right Gait and Station: not tested today as in transport chair today  Labs  I reviewed lab work from the primary care physician dated June 14, 2017.  Sodium was 141, potassium 4.3, chloride 106, CO2 30, BUN 15, creatinine 1.04, glucose 139, AST 21, ALT 12, alkaline phosphatase 70.  ASSESSMENT/PLAN:  1.  RUE tremor with hx of large MCA infarct, causing L hemiparesis  -The patient had a DaT scan on December 15, 2017 which was positive for loss of dopamine on the left (and loss of dopamine on the right due to prior infarct).  He and I discussed that this is not a diagnostic scan.  He was unable to tolerate immediate release levodopa in the past due to dizziness.  He is now on extended release.  He is tolerating it well.  He looks more parkinsonian today in that he has rigidity and decremation/bradykinesia.  However, he has not yet taken any medication today.  He wants to increase bedtime medication because of more tremor.  He will not take carbidopa/levodopa 25/100 CR, 2 tablets 3 times per day.  I considered adding a dopamine agonist or tremor medication, but he is having some hallucinations arising out of sleep, so decided to avoid the dose.  I may consider an on off test in the future.  -Encouraged exercise, but is unable to leave  the home during the day.  His wife works.  2.  Follow up is anticipated in the next few months, sooner should new neurologic issues arise.  Much greater than 50% of this visit was spent in counseling and coordinating care.  Total face to face time:  25 min     Cc:  Robert Pepper, MD

## 2018-08-23 ENCOUNTER — Ambulatory Visit: Payer: Medicare Other | Admitting: Neurology

## 2018-08-23 ENCOUNTER — Ambulatory Visit (INDEPENDENT_AMBULATORY_CARE_PROVIDER_SITE_OTHER): Payer: Medicare Other | Admitting: Neurology

## 2018-08-23 ENCOUNTER — Encounter: Payer: Self-pay | Admitting: Neurology

## 2018-08-23 VITALS — BP 136/70 | HR 76 | Ht 74.0 in | Wt 230.0 lb

## 2018-08-23 DIAGNOSIS — G20 Parkinson's disease: Secondary | ICD-10-CM

## 2018-08-23 DIAGNOSIS — G20C Parkinsonism, unspecified: Secondary | ICD-10-CM

## 2018-08-23 DIAGNOSIS — I69354 Hemiplegia and hemiparesis following cerebral infarction affecting left non-dominant side: Secondary | ICD-10-CM

## 2018-08-23 NOTE — Patient Instructions (Signed)
Increase your carbidopa/levodopa 25/100 CR to 2 tablets at 10am, 2 at 2pm, 2 at 7pm  I need you to get in your exercise!  Community Parkinson's Exercise Programs   Parkinson's Wellness Recovery Exercise Programs:   PWR! Moves PD Exercise Class:  This is a therapist-led exercise class for people with Parkinson's disease in the [place] community. It consists of a one-hour exercise class each week. Classes are offered in eight-week sessions, and the cost per session is $80. Class size is limited to a maximum of 20 participants. Participant criteria includes: Participant must be able to get up and down from the floor with minimal to no assistance, have had 0-1 falls in the past 6 months, and have completed physical or occupational therapy at [facility] within the past year.  To find out more about session dates, questions, or to register, please contact Mady Haagensen, Physical Therapist, or Nita Sells, Physical Brewing technologist, at [facility] at [phone].  PWR! Circuit Class:  This is a therapist-led exercise class with intervals of circuit activities incorporating PWR! Moves into functional activities. It consists of one 45-minute exercise class per week. Classes are offered in eight-week sessions, and the cost per session is $120. Class size is limited to a maximum of eight participants to allow for hands-on instruction. Participant criteria: class is ideal for people with Parkinson's disease who have completed PWR! Moves Exercise Class or who are currently independently exercising and want to be challenged, must be able to walk independently with 0-1 falls in the past 6 months, able to get up and down from the floor independently, able to sit to stand independently, and able to jog 20 feet.   To find out more about session dates, questions, or to register, please contact Mady Haagensen, Physical Therapist, or Nita Sells,  Physical Brewing technologist, at [facility] at [phone].   [employer] Parkinson's Cycle:   Parkinson's Cycle Class at [facility] This is an ongoing class on Monday and Thursday mornings at 10:45 a.m. A healthcare provider referral is required to enroll. This class is FREE to participants, and you do not have to be a member of the [employer] to enroll. Contact Beth at [phone] or [email or web address]. Parkinson's Cycle Class at [facility] Ongoing Class Monday, Wednesday, and Friday mornings at 9:00 a.m. A healthcare provider referral is required to enroll. This class is FREE to participants, and you do not have to be a member of the [employer] to enroll. Contact Marlee at [phone] or [email or web address]. Parkinson's Cycle Class at [facility] Ongoing Class every Friday mornings at 12 p.m.  A healthcare provider referral is required to enroll. This class is FREE to participants, and you do not have to be a member of the [employer] to enroll. Contact [phone].  Parkinson's Cycle Class at [facility] Ongoing Class every Monday at 12pm.  A healthcare provider referral is required to enroll. This class is FREE to participants, and you do not have to be a member of the [employer] to enroll. Contact Almyra Free at [phone] or  [email or web address].   [employer]:  [employer]  Classes are offered Mondays at 5:15 p.m. and Tuesdays and Thursdays at 12 p.m. at [employer]. For more information, contact [phone] or visit [email or web address] or [email or web address]. [employer] [place] Classes are offered Monday, Wednesday, and Friday from 9:30 a.m. - 11:00 a.m. For more information, contact [phone] or [phone] or email  [email or web address] or visit [email or web address] or [email or web address]. [employer]  (classes are offered at 2 locations) . [facility] [employer] in [place] (for more information, contact [place] at [phone] or email [email or web address] . Cathren Laine at [facility] (class is open to the public -- for more information, contact Clabe Seal at [phone] or email [email or web address]) [place] are held at [facility] in [place], [place]. For more information, call Dr. Bing Plume at [phone] or [email or web address].   Personal Training for Parkinson's:   [employer] Offers certified personal training to customize a program to meet your exercise needs to address Parkinson's disease. For more information, contact [phone] or visit [email or web address].  Community Dance for Parkinson's:   Community dance class for people with Parkinson's Disease Wednesdays at 9 a.m. [employer] [place], [place] [address] Please contact Eliberto Ivory [phone] for more information  Scholarships Available for Fitness Programs:  The [place] for [employer] is a non-profit 501(C)3 organization run by volunteers, whose mission is to strive to empower those living with Parkinson's Disease (PD), Progressive Supra-Nuclear Palsy (PSP) and Multiple System Atrophy ([place]).  Through financial support, recipients benefit from individual and group programs. [phone] [email or web address]

## 2018-08-31 DIAGNOSIS — E1169 Type 2 diabetes mellitus with other specified complication: Secondary | ICD-10-CM | POA: Diagnosis not present

## 2018-08-31 DIAGNOSIS — G20 Parkinson's disease: Secondary | ICD-10-CM | POA: Diagnosis not present

## 2018-08-31 DIAGNOSIS — G8190 Hemiplegia, unspecified affecting unspecified side: Secondary | ICD-10-CM | POA: Diagnosis not present

## 2018-08-31 DIAGNOSIS — E785 Hyperlipidemia, unspecified: Secondary | ICD-10-CM | POA: Diagnosis not present

## 2018-08-31 DIAGNOSIS — I10 Essential (primary) hypertension: Secondary | ICD-10-CM | POA: Diagnosis not present

## 2018-08-31 DIAGNOSIS — K5900 Constipation, unspecified: Secondary | ICD-10-CM | POA: Diagnosis not present

## 2018-09-05 ENCOUNTER — Telehealth: Payer: Self-pay | Admitting: Neurology

## 2018-09-05 MED ORDER — CARBIDOPA-LEVODOPA ER 25-100 MG PO TBCR: 2.0000 | EXTENDED_RELEASE_TABLET | Freq: Three times a day (TID) | ORAL | 5 refills | Status: DC

## 2018-09-05 NOTE — Telephone Encounter (Signed)
Patient wife states that we up the dosage of Carbidopa-levodopa but did not send in a updated RX to [facility]. Please send a updated RX in with the Correct dosage information

## 2018-09-05 NOTE — Telephone Encounter (Signed)
RX sent to the pharmacy as requested.

## 2018-09-29 DIAGNOSIS — Z800 Family history of malignant neoplasm of digestive organs: Secondary | ICD-10-CM | POA: Diagnosis not present

## 2018-09-29 DIAGNOSIS — K5730 Diverticulosis of large intestine without perforation or abscess without bleeding: Secondary | ICD-10-CM | POA: Diagnosis not present

## 2018-09-29 DIAGNOSIS — Z1211 Encounter for screening for malignant neoplasm of colon: Secondary | ICD-10-CM | POA: Diagnosis not present

## 2018-09-29 DIAGNOSIS — D122 Benign neoplasm of ascending colon: Secondary | ICD-10-CM | POA: Diagnosis not present

## 2018-09-29 DIAGNOSIS — D123 Benign neoplasm of transverse colon: Secondary | ICD-10-CM | POA: Diagnosis not present

## 2018-10-04 DIAGNOSIS — D123 Benign neoplasm of transverse colon: Secondary | ICD-10-CM | POA: Diagnosis not present

## 2018-10-04 DIAGNOSIS — D122 Benign neoplasm of ascending colon: Secondary | ICD-10-CM | POA: Diagnosis not present

## 2018-10-04 DIAGNOSIS — Z1211 Encounter for screening for malignant neoplasm of colon: Secondary | ICD-10-CM | POA: Diagnosis not present

## 2018-10-11 ENCOUNTER — Telehealth: Payer: Self-pay | Admitting: Neurology

## 2018-10-11 NOTE — Telephone Encounter (Signed)
Patient's wife called and left a vm about changes in her husband. She said he is seeing things and to call her back ASAP. Please call [phone]. Thanks!

## 2018-10-11 NOTE — Telephone Encounter (Signed)
Left message on machine for patient's wife to call back.   

## 2018-10-13 NOTE — Telephone Encounter (Signed)
Patient wife called and left a VM stating that she wants a call back she has left several messages

## 2018-10-13 NOTE — Telephone Encounter (Signed)
Left message on machine for patient's wife to call back. Left a message letting her know I called back the one other message I got and phone goes straight to voicemail. I urged them to leave a good contact number for [place] to reach them back.

## 2018-12-27 DIAGNOSIS — M545 Low back pain: Secondary | ICD-10-CM | POA: Diagnosis not present

## 2018-12-31 DIAGNOSIS — Z86718 Personal history of other venous thrombosis and embolism: Secondary | ICD-10-CM | POA: Diagnosis not present

## 2018-12-31 DIAGNOSIS — I69354 Hemiplegia and hemiparesis following cerebral infarction affecting left non-dominant side: Secondary | ICD-10-CM | POA: Diagnosis not present

## 2018-12-31 DIAGNOSIS — M545 Low back pain: Secondary | ICD-10-CM | POA: Diagnosis not present

## 2018-12-31 DIAGNOSIS — I10 Essential (primary) hypertension: Secondary | ICD-10-CM | POA: Diagnosis not present

## 2018-12-31 DIAGNOSIS — Z7982 Long term (current) use of aspirin: Secondary | ICD-10-CM | POA: Diagnosis not present

## 2019-01-11 DIAGNOSIS — Z86718 Personal history of other venous thrombosis and embolism: Secondary | ICD-10-CM | POA: Diagnosis not present

## 2019-01-11 DIAGNOSIS — Z7982 Long term (current) use of aspirin: Secondary | ICD-10-CM | POA: Diagnosis not present

## 2019-01-11 DIAGNOSIS — I69354 Hemiplegia and hemiparesis following cerebral infarction affecting left non-dominant side: Secondary | ICD-10-CM | POA: Diagnosis not present

## 2019-01-11 DIAGNOSIS — I10 Essential (primary) hypertension: Secondary | ICD-10-CM | POA: Diagnosis not present

## 2019-01-11 DIAGNOSIS — M545 Low back pain: Secondary | ICD-10-CM | POA: Diagnosis not present

## 2019-01-17 ENCOUNTER — Ambulatory Visit: Payer: Medicare Other | Admitting: Neurology

## 2019-01-18 DIAGNOSIS — M545 Low back pain: Secondary | ICD-10-CM | POA: Diagnosis not present

## 2019-01-18 DIAGNOSIS — Z7982 Long term (current) use of aspirin: Secondary | ICD-10-CM | POA: Diagnosis not present

## 2019-01-18 DIAGNOSIS — Z86718 Personal history of other venous thrombosis and embolism: Secondary | ICD-10-CM | POA: Diagnosis not present

## 2019-01-18 DIAGNOSIS — I10 Essential (primary) hypertension: Secondary | ICD-10-CM | POA: Diagnosis not present

## 2019-01-18 DIAGNOSIS — I69354 Hemiplegia and hemiparesis following cerebral infarction affecting left non-dominant side: Secondary | ICD-10-CM | POA: Diagnosis not present

## 2019-01-19 DIAGNOSIS — M545 Low back pain: Secondary | ICD-10-CM | POA: Diagnosis not present

## 2019-01-24 DIAGNOSIS — Z86718 Personal history of other venous thrombosis and embolism: Secondary | ICD-10-CM | POA: Diagnosis not present

## 2019-01-24 DIAGNOSIS — Z7982 Long term (current) use of aspirin: Secondary | ICD-10-CM | POA: Diagnosis not present

## 2019-01-24 DIAGNOSIS — I69354 Hemiplegia and hemiparesis following cerebral infarction affecting left non-dominant side: Secondary | ICD-10-CM | POA: Diagnosis not present

## 2019-01-24 DIAGNOSIS — I10 Essential (primary) hypertension: Secondary | ICD-10-CM | POA: Diagnosis not present

## 2019-01-24 DIAGNOSIS — M545 Low back pain: Secondary | ICD-10-CM | POA: Diagnosis not present

## 2019-01-27 DIAGNOSIS — I69354 Hemiplegia and hemiparesis following cerebral infarction affecting left non-dominant side: Secondary | ICD-10-CM | POA: Diagnosis not present

## 2019-01-27 DIAGNOSIS — I10 Essential (primary) hypertension: Secondary | ICD-10-CM | POA: Diagnosis not present

## 2019-01-27 DIAGNOSIS — M545 Low back pain: Secondary | ICD-10-CM | POA: Diagnosis not present

## 2019-01-27 DIAGNOSIS — Z7982 Long term (current) use of aspirin: Secondary | ICD-10-CM | POA: Diagnosis not present

## 2019-01-27 DIAGNOSIS — Z86718 Personal history of other venous thrombosis and embolism: Secondary | ICD-10-CM | POA: Diagnosis not present

## 2019-01-30 DIAGNOSIS — Z86718 Personal history of other venous thrombosis and embolism: Secondary | ICD-10-CM | POA: Diagnosis not present

## 2019-01-30 DIAGNOSIS — I10 Essential (primary) hypertension: Secondary | ICD-10-CM | POA: Diagnosis not present

## 2019-01-30 DIAGNOSIS — M545 Low back pain: Secondary | ICD-10-CM | POA: Diagnosis not present

## 2019-01-30 DIAGNOSIS — Z7982 Long term (current) use of aspirin: Secondary | ICD-10-CM | POA: Diagnosis not present

## 2019-01-30 DIAGNOSIS — I69354 Hemiplegia and hemiparesis following cerebral infarction affecting left non-dominant side: Secondary | ICD-10-CM | POA: Diagnosis not present

## 2019-02-01 DIAGNOSIS — M545 Low back pain: Secondary | ICD-10-CM | POA: Diagnosis not present

## 2019-02-01 DIAGNOSIS — I69354 Hemiplegia and hemiparesis following cerebral infarction affecting left non-dominant side: Secondary | ICD-10-CM | POA: Diagnosis not present

## 2019-02-01 DIAGNOSIS — I10 Essential (primary) hypertension: Secondary | ICD-10-CM | POA: Diagnosis not present

## 2019-02-01 DIAGNOSIS — Z86718 Personal history of other venous thrombosis and embolism: Secondary | ICD-10-CM | POA: Diagnosis not present

## 2019-02-01 DIAGNOSIS — Z7982 Long term (current) use of aspirin: Secondary | ICD-10-CM | POA: Diagnosis not present

## 2019-02-02 ENCOUNTER — Telehealth: Payer: Self-pay | Admitting: Neurology

## 2019-02-02 ENCOUNTER — Other Ambulatory Visit (INDEPENDENT_AMBULATORY_CARE_PROVIDER_SITE_OTHER): Payer: Medicare Other

## 2019-02-02 ENCOUNTER — Other Ambulatory Visit: Payer: Self-pay | Admitting: *Deleted

## 2019-02-02 DIAGNOSIS — R443 Hallucinations, unspecified: Secondary | ICD-10-CM

## 2019-02-02 DIAGNOSIS — G20 Parkinson's disease: Secondary | ICD-10-CM

## 2019-02-02 DIAGNOSIS — R6889 Other general symptoms and signs: Secondary | ICD-10-CM

## 2019-02-02 DIAGNOSIS — R251 Tremor, unspecified: Secondary | ICD-10-CM

## 2019-02-02 DIAGNOSIS — R300 Dysuria: Secondary | ICD-10-CM

## 2019-02-02 DIAGNOSIS — N390 Urinary tract infection, site not specified: Secondary | ICD-10-CM

## 2019-02-02 DIAGNOSIS — R350 Frequency of micturition: Secondary | ICD-10-CM

## 2019-02-02 LAB — COMPREHENSIVE METABOLIC PANEL
ALT: 6 U/L (ref 0–53)
AST: 14 U/L (ref 0–37)
Albumin: 3.9 g/dL (ref 3.5–5.2)
Alkaline Phosphatase: 80 U/L (ref 39–117)
BUN: 14 mg/dL (ref 6–23)
CO2: 29 mEq/L (ref 19–32)
Calcium: 9.5 mg/dL (ref 8.4–10.5)
Chloride: 103 mEq/L (ref 96–112)
Creatinine, Ser: 1.17 mg/dL (ref 0.40–1.50)
GFR: 74.25 mL/min (ref >60.00)
Glucose, Bld: 160 mg/dL — ABNORMAL HIGH (ref 70–99)
Potassium: 3.6 mEq/L (ref 3.5–5.1)
Sodium: 139 mEq/L (ref 135–145)
Total Bilirubin: 0.6 mg/dL (ref 0.2–1.2)
Total Protein: 7.1 g/dL (ref 6.0–8.3)

## 2019-02-02 LAB — CBC
HCT: 45.2 % (ref 39.0–52.0)
Hemoglobin: 15.2 g/dL (ref 13.0–17.0)
MCHC: 33.6 g/dL (ref 30.0–36.0)
MCV: 89.2 fl (ref 78.0–100.0)
Platelets: 258 10*3/uL (ref 150.0–400.0)
RBC: 5.07 Mil/uL (ref 4.22–5.81)
RDW: 13.9 % (ref 11.5–15.5)
WBC: 6.5 10*3/uL (ref 4.0–10.5)

## 2019-02-02 NOTE — Telephone Encounter (Signed)
Get details.  Are they arising out of day or just sleep?  What is he seeing?  When did it start (he was having some last time arising out of sleep).  If brand new and during the day, will need chem, cbc, UA

## 2019-02-02 NOTE — Telephone Encounter (Signed)
Patient's wife called regarding Robert [name] having hallucinations. She said she was to call our office if this started happening. She said it is related to his Parkinson's. She said the only medication he is on is for his Tremors. Please Call. Thanks

## 2019-02-02 NOTE — Telephone Encounter (Signed)
Please advise 

## 2019-02-02 NOTE — Telephone Encounter (Signed)
These just started last week. Once, he said he saw his wife crawling under the table and another time he saw people walking around outside.  These were both during the day.  She will bring him to the lab today for the lab work.

## 2019-02-06 ENCOUNTER — Telehealth: Payer: Self-pay | Admitting: *Deleted

## 2019-02-06 ENCOUNTER — Telehealth: Payer: Self-pay | Admitting: Neurology

## 2019-02-06 ENCOUNTER — Other Ambulatory Visit (INDEPENDENT_AMBULATORY_CARE_PROVIDER_SITE_OTHER): Payer: Medicare Other

## 2019-02-06 ENCOUNTER — Other Ambulatory Visit: Payer: Self-pay

## 2019-02-06 DIAGNOSIS — R443 Hallucinations, unspecified: Secondary | ICD-10-CM

## 2019-02-06 DIAGNOSIS — G20C Parkinsonism, unspecified: Secondary | ICD-10-CM

## 2019-02-06 DIAGNOSIS — R251 Tremor, unspecified: Secondary | ICD-10-CM | POA: Diagnosis not present

## 2019-02-06 DIAGNOSIS — G20 Parkinson's disease: Secondary | ICD-10-CM | POA: Diagnosis not present

## 2019-02-06 LAB — URINALYSIS, ROUTINE W REFLEX MICROSCOPIC
Bilirubin Urine: NEGATIVE
Ketones, ur: NEGATIVE
Nitrite: NEGATIVE
Specific Gravity, Urine: 1.02 (ref 1.000–1.030)
Urine Glucose: 100 — AB
Urobilinogen, UA: 1 (ref 0.0–1.0)
pH: 7.5 (ref 5.0–8.0)

## 2019-02-06 NOTE — Telephone Encounter (Signed)
Dr.Morrow stated that since the testing for the urine -UTI + was ordered from Dr. Carles Collet the antibiotic needed to be prescribed from her. Please review the results and the correct pharm is on file- [facility]. Thanks!

## 2019-02-06 NOTE — Telephone Encounter (Signed)
Goodness!  I have never had a PCP not treat UTI.  I did order that as he had hallucinations and that is in the work up for hallucinations but RX antibiotics for UTI is out of my realm of expertise and I don't feel comfortable.  I think that we can help him get a video visit however for treatment.  Can you ask Hoyle Sauer about information for this (prefer [facility] system since the UA is in our system).  Apologize to patient/wife for me as well.

## 2019-02-06 NOTE — Telephone Encounter (Signed)
Left message giving patient's wife results and instructions.  Results mailed to Dr. Orland Mustard.

## 2019-02-06 NOTE — Telephone Encounter (Signed)
Let pt/wife know that labs I got were okay but I'm missing the UA (was he not able to give a sample)?  That may be the most important of the tests that I needed.

## 2019-02-06 NOTE — Telephone Encounter (Signed)
-----   Message from Burchard, DO sent at 02/06/2019  2:33 PM EDT ----- Let pt/wife know that it certainly looks like he could have a UTI and that could account for his sx's.  Needs f/u with PCP for appropriate tx.  Fax these to PCP and see if he can get an appt.  Thanks!

## 2019-02-06 NOTE — Telephone Encounter (Signed)
It looks like he gave urine sample this morning at 8:13.

## 2019-02-07 ENCOUNTER — Telehealth: Payer: Medicare Other | Admitting: Physician Assistant

## 2019-02-07 ENCOUNTER — Other Ambulatory Visit: Payer: Self-pay | Admitting: *Deleted

## 2019-02-07 DIAGNOSIS — Z86718 Personal history of other venous thrombosis and embolism: Secondary | ICD-10-CM | POA: Diagnosis not present

## 2019-02-07 DIAGNOSIS — N390 Urinary tract infection, site not specified: Secondary | ICD-10-CM

## 2019-02-07 DIAGNOSIS — R350 Frequency of micturition: Secondary | ICD-10-CM

## 2019-02-07 DIAGNOSIS — I69354 Hemiplegia and hemiparesis following cerebral infarction affecting left non-dominant side: Secondary | ICD-10-CM | POA: Diagnosis not present

## 2019-02-07 DIAGNOSIS — R300 Dysuria: Secondary | ICD-10-CM

## 2019-02-07 DIAGNOSIS — Z7982 Long term (current) use of aspirin: Secondary | ICD-10-CM | POA: Diagnosis not present

## 2019-02-07 DIAGNOSIS — M545 Low back pain: Secondary | ICD-10-CM | POA: Diagnosis not present

## 2019-02-07 DIAGNOSIS — R6889 Other general symptoms and signs: Secondary | ICD-10-CM

## 2019-02-07 DIAGNOSIS — I10 Essential (primary) hypertension: Secondary | ICD-10-CM | POA: Diagnosis not present

## 2019-02-07 MED ORDER — CEPHALEXIN 500 MG PO CAPS: 500.0000 mg | ORAL_CAPSULE | Freq: Two times a day (BID) | ORAL | 0 refills | Status: AC

## 2019-02-07 NOTE — Progress Notes (Signed)
Based on what you shared with me, I feel your condition warrants further evaluation and I recommend that you be seen for a face to face office visit.     NOTE: If you entered your credit card information for this eVisit, you will not be charged. You may see a "hold" on your card for the $35 but that hold will drop off and you will not have a charge processed.  If you are having a true medical emergency please call 911.  If you need an urgent face to face visit, [facility] has four urgent care centers for your convenience.    PLEASE NOTE: THE INSTACARE LOCATIONS AND URGENT CARE CLINICS DO NOT HAVE THE TESTING FOR CORONAVIRUS COVID19 AVAILABLE.  IF YOU FEEL YOU NEED THIS TEST YOU MUST GO TO A TRIAGE LOCATION AT [place] ?  [email or web address] to reserve your spot online an avoid wait times  [facility] [address], [address] [place], [place] [address] Modified hours of operation: Monday-Friday, 10 AM to 6 PM  Saturday & Sunday 10 AM to 4 PM *Across the street from [place] (New Address!) [address], [place], [place] [address] *Just off [address], across the road from [place] hours of operation: Monday-Friday, 10 AM to 5 PM  Closed Saturday & Sunday   The following sites will take your insurance:  [name] a Provider at this Location  [place], [place] [address] 10 am to 8 pm Monday-Friday 12 pm to 8 pm [facility] [place] a Provider at this Location  [place], [place], [place] [address] 8 am to 8 pm Monday-Friday 9 am to 6 pm Saturday 11 am to 6 pm Sunday   [facility]  [phone] Get Driving Directions  [address] [place], [place] [address] 8 am to 8 pm Monday-Friday 8 am to 4 pm Saturday-Sunday   Your e-visit answers were reviewed by a board certified advanced clinical practitioner to complete your personal care plan.  Thank you for using e-Visits.  ===View-only below this line===   ----- Message -----    From: Robert [name]    Sent: 02/07/2019  1:23 PM EDT      To: E-Visit Mailing List Subject: E-Visit Submission: Urinary Problems  E-Visit Submission: Urinary Problems --------------------------------  Question: Which of the following are you experiencing? Answer:   Change in urine appearance or smell  Question: Are you able to pass urine? Answer:   Yes, I can pass urine.  Question: How long have you had pain or difficulty passing urine? Answer:   More  than two days but less than one week  Question: Do you have a fever? Answer:   No, I do not have a fever  Question: Do you have any of the following? Answer:   I have back pain with this illness  Question: Do you have an exaggerated sensation of the need to pass urine? Answer:   No, the sensation is normal  Question: Do you have the urge to urinate more of less frequently than normal? Answer:   The same as normal  Question: What does your urine look like? Answer:   It is cloudy  Question: Do you have any of the following? Answer:  None of the above  Question: Do you have any of the following? Answer:   No discharge  Question: Do you have any sores on your genitals? Answer:   No  Question: Do you have any history of kidney dysfunction or kidney problems? Answer:   No  Question: Within the past 3 months, have you had any surgery on your kidneys or bladder, or have you had a tube inserted to collect your urine? Answer:   No, I have never had either  Question: Have you had similar symptoms in the past? Answer:   No, I have never had  these symptoms  Question: Please list any additional comments  Answer:      Question: Please list your medication allergies that you may have ? (If 'none' , please list as 'none') Answer:   None

## 2019-02-07 NOTE — Telephone Encounter (Signed)
I am working on this

## 2019-02-07 NOTE — Addendum Note (Signed)
Addended by: Tereasa Coop on: 02/07/2019 02:48 PM   Modules accepted: Orders

## 2019-02-07 NOTE — Telephone Encounter (Signed)
Patient's wife is calling in again about medication.Marland KitchenMarland Kitchen

## 2019-02-07 NOTE — Telephone Encounter (Signed)
I spoke with Mrs. Rossin and informed her that she would have to set up an e-visit with [facility] so that patient can get treatment for the UTI.  I instructed her how to log on and to create a My Chart account.

## 2019-02-07 NOTE — Telephone Encounter (Signed)
I spoke with Robert [name] and informed her that I am working on this and will call her as soon as I know something.

## 2019-02-08 ENCOUNTER — Telehealth: Payer: Self-pay | Admitting: Neurology

## 2019-02-08 NOTE — Telephone Encounter (Signed)
Okay but please let wife know that UC was very kind to [place] and treated him off their usual protocol with the caveat that I would do the PSA for them, so want to make sure that does get done.

## 2019-02-08 NOTE — Telephone Encounter (Signed)
FYI

## 2019-02-08 NOTE — Telephone Encounter (Signed)
Wife called to let you know she wont be here with him today to get blood work that they will come in on Friday around 1:30 for labs. Thanks. Just FYI.

## 2019-02-08 NOTE — Telephone Encounter (Signed)
I will make sure that it gets done.

## 2019-02-09 ENCOUNTER — Telehealth: Payer: Self-pay | Admitting: Neurology

## 2019-02-09 NOTE — Telephone Encounter (Signed)
Robert [name] called regarding having received a phone call yesterday around 5:00 PM? Thanks

## 2019-02-09 NOTE — Telephone Encounter (Signed)
I did not call patient.

## 2019-02-10 ENCOUNTER — Other Ambulatory Visit: Payer: Self-pay

## 2019-02-10 ENCOUNTER — Other Ambulatory Visit (INDEPENDENT_AMBULATORY_CARE_PROVIDER_SITE_OTHER): Payer: Medicare Other

## 2019-02-10 ENCOUNTER — Telehealth: Payer: Self-pay | Admitting: Neurology

## 2019-02-10 DIAGNOSIS — R350 Frequency of micturition: Secondary | ICD-10-CM | POA: Diagnosis not present

## 2019-02-10 DIAGNOSIS — N390 Urinary tract infection, site not specified: Secondary | ICD-10-CM | POA: Diagnosis not present

## 2019-02-10 DIAGNOSIS — R300 Dysuria: Secondary | ICD-10-CM | POA: Diagnosis not present

## 2019-02-10 DIAGNOSIS — R6889 Other general symptoms and signs: Secondary | ICD-10-CM

## 2019-02-10 LAB — PSA: PSA: 4.76 ng/mL — ABNORMAL HIGH (ref 0.10–4.00)

## 2019-02-10 NOTE — Telephone Encounter (Signed)
Can you check on urine cx?  It doesn't look like we have any results, including preliminary results.  Also, wife was supposed to bring patient in for that PSA.  What happened?

## 2019-02-10 NOTE — Telephone Encounter (Signed)
Urine culture is in process and PSA was collected at 2:03 today.

## 2019-02-12 LAB — URINE CULTURE
MICRO NUMBER:: 394323
SPECIMEN QUALITY:: ADEQUATE

## 2019-02-13 ENCOUNTER — Telehealth: Payer: Self-pay | Admitting: *Deleted

## 2019-02-13 NOTE — Telephone Encounter (Signed)
Patient's wife given results.  He has a urologist and will follow up.  She said that he is accusing her of cheating.  This does not happen everyday but is happening more often.  Please advise.

## 2019-02-13 NOTE — Telephone Encounter (Signed)
Patient's wife called regarding Robert [name] having hallucinations and she was also calling for lab results.  Please Call. Thanks

## 2019-02-13 NOTE — Telephone Encounter (Signed)
I suspects its b/c UTI not adequately treated.  Needs to call urologist.  Not sure if treated with correct abx or not.  This is really out of my realm of expertise.

## 2019-02-13 NOTE — Telephone Encounter (Signed)
-----   Message from [place], DO sent at 02/13/2019  8:05 AM EDT ----- Please let pt know that PSA is just a tad elevated.  Find out if has a urologist.  If not, please refer

## 2019-02-13 NOTE — Telephone Encounter (Signed)
Called patient's wife and gave her results.

## 2019-02-14 DIAGNOSIS — Z7982 Long term (current) use of aspirin: Secondary | ICD-10-CM | POA: Diagnosis not present

## 2019-02-14 DIAGNOSIS — M545 Low back pain: Secondary | ICD-10-CM | POA: Diagnosis not present

## 2019-02-14 DIAGNOSIS — I69354 Hemiplegia and hemiparesis following cerebral infarction affecting left non-dominant side: Secondary | ICD-10-CM | POA: Diagnosis not present

## 2019-02-14 DIAGNOSIS — I10 Essential (primary) hypertension: Secondary | ICD-10-CM | POA: Diagnosis not present

## 2019-02-14 DIAGNOSIS — Z86718 Personal history of other venous thrombosis and embolism: Secondary | ICD-10-CM | POA: Diagnosis not present

## 2019-02-14 NOTE — Telephone Encounter (Signed)
I will call patient's wife this afternoon.  She does not get home from work until 1:30.

## 2019-02-14 NOTE — Telephone Encounter (Signed)
Called patient's wife and gave her the instructions per Dr. Carles Collet.

## 2019-02-20 ENCOUNTER — Telehealth: Payer: Self-pay | Admitting: Neurology

## 2019-02-20 NOTE — Telephone Encounter (Signed)
Patient's wife called regarding Robert [name] and his next follow up appointment. He is scheduled for Mar 13 2019. She is wondering if he can do an E visit and if so, can he go ahead and be scheduled so she can make sure she is available and not at work that day. Please Advise. Thanks

## 2019-02-20 NOTE — Telephone Encounter (Signed)
Contacted Robert [name] the patients wife and advised her that it is to soon to tell if we were going to do an evist or office visit. I expressed to her we would call her as soon as possible to advise.

## 2019-02-27 ENCOUNTER — Telehealth: Payer: Self-pay | Admitting: Neurology

## 2019-02-27 DIAGNOSIS — Z86718 Personal history of other venous thrombosis and embolism: Secondary | ICD-10-CM | POA: Diagnosis not present

## 2019-02-27 DIAGNOSIS — I10 Essential (primary) hypertension: Secondary | ICD-10-CM | POA: Diagnosis not present

## 2019-02-27 DIAGNOSIS — M545 Low back pain: Secondary | ICD-10-CM | POA: Diagnosis not present

## 2019-02-27 DIAGNOSIS — I69354 Hemiplegia and hemiparesis following cerebral infarction affecting left non-dominant side: Secondary | ICD-10-CM | POA: Diagnosis not present

## 2019-02-27 DIAGNOSIS — Z7982 Long term (current) use of aspirin: Secondary | ICD-10-CM | POA: Diagnosis not present

## 2019-02-27 NOTE — Telephone Encounter (Signed)
Called and talk to pt wife stated --made an appointment to urology scheduled tomorrow.

## 2019-02-27 NOTE — Telephone Encounter (Signed)
Did they call the urologist or PCP?  While I can see him, we learned that UTI can cause hallucination in him and we learned that his PCP won't treat him if I order the UA.  They need to call PCP or his urologist today

## 2019-02-27 NOTE — Telephone Encounter (Signed)
Did they follow up with anyone (urology, PCP) to make sure that urine was all the way treated?  If so, who did they see?

## 2019-02-27 NOTE — Telephone Encounter (Signed)
Pt wife called---pt had hallucination this morning and this happen 2 weeks ago  with the UTI. Pt wife took the day-off today to see if can needed appointment today. Please advise

## 2019-02-27 NOTE — Telephone Encounter (Signed)
Called and talk to Pt wife --stated no follow up with the PCP/urology due to pt  having UTI  couple times a year, and take the antibiotic and pt wife believe UTI is clear--Pt wife wants to know what to do since she need to go back to work tomorrow.

## 2019-02-27 NOTE — Telephone Encounter (Signed)
Patient wife left message with after hour answering service on 02-27-19 @ 6:22 am   Caller states patient is having hallucinations that started approx about half hour ago. Seeing  ppl  in the other room and hearing them talk.   Patient wife is very concerned and wants patient seen ASAP   Please call .

## 2019-02-27 NOTE — Telephone Encounter (Signed)
Patient's wife called back wanting to let you know that he has had several UTI's in the past and has never had hallucinations with them. Thanks

## 2019-02-27 NOTE — Telephone Encounter (Signed)
I spoke with patient's wife and she said that you wanted her to let you know if patient starts having daytime hallucinations.  He has had them since he has been treated for his UTI.  His urine is now clear.  He has also been restless at night.  Please advise.

## 2019-02-27 NOTE — Telephone Encounter (Signed)
She needs to make an appointment with urology.   Per phone call of 4/20, we told her this as well and she said that she would f/u with that.  This is out of my area of expertise until we know that UTI was effectively treated.  The PCP also can likely see them via video.

## 2019-02-28 DIAGNOSIS — N390 Urinary tract infection, site not specified: Secondary | ICD-10-CM | POA: Diagnosis not present

## 2019-02-28 DIAGNOSIS — N401 Enlarged prostate with lower urinary tract symptoms: Secondary | ICD-10-CM | POA: Diagnosis not present

## 2019-02-28 DIAGNOSIS — R3914 Feeling of incomplete bladder emptying: Secondary | ICD-10-CM | POA: Diagnosis not present

## 2019-03-01 DIAGNOSIS — Z7982 Long term (current) use of aspirin: Secondary | ICD-10-CM | POA: Diagnosis not present

## 2019-03-01 DIAGNOSIS — Z86718 Personal history of other venous thrombosis and embolism: Secondary | ICD-10-CM | POA: Diagnosis not present

## 2019-03-01 DIAGNOSIS — I10 Essential (primary) hypertension: Secondary | ICD-10-CM | POA: Diagnosis not present

## 2019-03-01 DIAGNOSIS — I69354 Hemiplegia and hemiparesis following cerebral infarction affecting left non-dominant side: Secondary | ICD-10-CM | POA: Diagnosis not present

## 2019-03-07 ENCOUNTER — Telehealth: Payer: Self-pay | Admitting: Neurology

## 2019-03-07 ENCOUNTER — Other Ambulatory Visit: Payer: Self-pay | Admitting: Neurology

## 2019-03-07 DIAGNOSIS — Z0000 Encounter for general adult medical examination without abnormal findings: Secondary | ICD-10-CM | POA: Diagnosis not present

## 2019-03-07 DIAGNOSIS — E785 Hyperlipidemia, unspecified: Secondary | ICD-10-CM | POA: Diagnosis not present

## 2019-03-07 DIAGNOSIS — E1169 Type 2 diabetes mellitus with other specified complication: Secondary | ICD-10-CM | POA: Diagnosis not present

## 2019-03-07 NOTE — Telephone Encounter (Signed)
Glad to hear that.  If she would like to schedule an evisit to discuss hallucinations, we can do that.

## 2019-03-07 NOTE — Telephone Encounter (Signed)
Patient is scheduled for E-visit on Friday at 2:30. Thanks!

## 2019-03-07 NOTE — Telephone Encounter (Signed)
FYI

## 2019-03-07 NOTE — Telephone Encounter (Signed)
Patient's wife called to let Dr. Carles Collet know that [facility] called her and [place] does not have a UTI. Thanks

## 2019-03-08 DIAGNOSIS — E785 Hyperlipidemia, unspecified: Secondary | ICD-10-CM | POA: Diagnosis not present

## 2019-03-08 DIAGNOSIS — F321 Major depressive disorder, single episode, moderate: Secondary | ICD-10-CM | POA: Diagnosis not present

## 2019-03-08 DIAGNOSIS — E1169 Type 2 diabetes mellitus with other specified complication: Secondary | ICD-10-CM | POA: Diagnosis not present

## 2019-03-08 DIAGNOSIS — I10 Essential (primary) hypertension: Secondary | ICD-10-CM | POA: Diagnosis not present

## 2019-03-08 DIAGNOSIS — G20 Parkinson's disease: Secondary | ICD-10-CM | POA: Diagnosis not present

## 2019-03-08 DIAGNOSIS — R441 Visual hallucinations: Secondary | ICD-10-CM | POA: Diagnosis not present

## 2019-03-08 DIAGNOSIS — G8190 Hemiplegia, unspecified affecting unspecified side: Secondary | ICD-10-CM | POA: Diagnosis not present

## 2019-03-08 DIAGNOSIS — Z0000 Encounter for general adult medical examination without abnormal findings: Secondary | ICD-10-CM | POA: Diagnosis not present

## 2019-03-08 NOTE — Progress Notes (Signed)
Virtual Visit via Video Note (10 min video, remainder via phone due to poor quality) The purpose of this virtual visit is to provide medical care while limiting exposure to the novel coronavirus.    Consent was obtained for video visit:  Yes.   Answered questions that patient had about telehealth interaction:  Yes.   I discussed the limitations, risks, security and privacy concerns of performing an evaluation and management service by telemedicine. I also discussed with the patient that there may be a patient responsible charge related to this service. The patient expressed understanding and agreed to proceed.  Pt location: Home Physician Location: office Name of referring provider:  [name], MD I connected with [name] at patients initiation/request on 03/10/2019 at  2:30 PM EDT by video enabled telemedicine application and verified that I am speaking with the correct person using two identifiers. 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  He is on carbidopa/levodopa 25/100 CR, 2 tablets 3 times per day.  His wife called in April with acute onset of hallucinations, which he really had not had in the past.  We did a work-up, and he had evidence of urinary tract infection with urine glucose of 100, blood in the urine, large amount of leukocytes, 21-50 white blood cells in the urine, many bacteria.  His primary care did not want to treat this because I had ordered the urinalysis, but this was far out of my field of expertise so the patient ended up going to urgent care for treatment.  Ultimately, he got better, but not long thereafter felt like he was getting worse again.  I asked the patient's wife to call his urologist, and he did get an appointment there.  They did a urine culture  and his wife reports today that that was negative.  I do not have any of those notes to review.  His wife does state that he is having hallucinations.  Pt states "I just see people in the house."  States "I know now that they are not real."  May stay 10-15 min.  Are not scary.  They never talk to the patient.  No new medications, including OTC medications.  States that they had virtual visit with PCP yesterday and he RX zoloft for this.  They didn't try it yet because they wanted to see me first.    Current Outpatient Medications on File Prior to Visit  Medication Sig Dispense Refill  . aspirin 81 MG tablet Take 81 mg by mouth daily.    . Carbidopa-Levodopa ER (SINEMET CR) 25-100 MG tablet controlled release TAKE TWO TABLETS BY MOUTH THREE TIMES DAILY  180 tablet 4  . losartan (COZAAR) 50 MG tablet Take 25 mg by mouth every morning.     . polyethylene glycol (MIRALAX) packet Take 17 g by mouth daily. 14 each 0  . simvastatin (ZOCOR) 20 MG tablet Take 20 mg by mouth every evening.     No current facility-administered medications on file prior to visit.       Observations/Objective:   Vitals:   03/10/19 1423  BP: 125/63  Weight: 230 lb (104.3 kg)  Height: 6\' 3"  (1.905 m)   GEN:  The patient appears stated age and is in NAD.  Neurological examination:  Orientation:  The patient is alert and oriented x3. Cranial nerves: There is good facial symmetry. 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  The speech is fluent and clear. Soft palate rises symmetrically and there is no tongue deviation. Hearing is decreased to conversational tone. Motor: Strength is at least antigravity on the R  Movement examination: Tone: unable Abnormal movements: unable to see hands at rest via video Coordination:  Video froze when tried to test and converted to phone visit Gait and Station: not tested.    Assessment and Plan:   1.  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  He and I discussed that this is not a diagnostic scan.  He was unable to tolerate immediate release levodopa in the past due to dizziness.  He is now on extended release.  He is tolerating it well.  He has looked more parkinsonian with time.  -We decided to just slightly decrease his carbidopa/levodopa from 6 tablets/day to 5 tablets/day to see if they will help at all with hallucinations.  He will take carbidopa/levodopa 25/100 CR, 2 tablets in the morning, 2 tablets in the middle of the day and 1 tablet in the evening.  2.  Recent hallucinations  -Initially, they started with a urinary tract infection.  This has apparently been cleared according to the patient's wife (seen by urology).  Discussed various treatments.  As above, we decided to go ahead and start with decreasing the levodopa first.  Discussed nuplazid as well as quetiapine.  Discussed that Nuplazid could take up to 6 weeks to work.  We did talk about the fact that the atypical antipsychotic medications are not indicated for dementia related psychosis and increased risk of mortality in the elderly, usually because of infectious or  cardiac related. Understanding is expressed and they were agreeable that the benefits outweigh the risks in this case.  Last EKG was in 2018.  Wife will call me in 1 week and let me know how he did with decreased levodopa.  If he is still having hallucinations, we will start nuplazid.  In anticipation of that, with I will go ahead and order the EKG today to look at the QT interval.  -Discussed safety in the home today.  They report that they have no guns.  I recommended 24 hour/day care.  I did not recommend that the patient stay home alone.             Follow Up Instructions:  4 months  -I discussed the assessment and treatment plan with the patient. The patient was provided  an opportunity to ask questions and all were answered. The patient agreed with the plan and demonstrated an understanding of the instructions.   The patient was advised to call back or seek an in-person evaluation if the symptoms worsen or if the condition fails to improve as anticipated.    Total Time spent in visit with the patient was:  25 min, of which more than 50% of the time was spent in counseling and/or coordinating care on safety.   Pt understands and agrees with the plan of care outlined.     Alonza Bogus, DO

## 2019-03-09 ENCOUNTER — Telehealth: Payer: Self-pay | Admitting: *Deleted

## 2019-03-09 NOTE — Telephone Encounter (Signed)
-----   Message from [place], DO sent at 03/09/2019  9:11 AM EDT ----- Can you call pcp and get last ekg before tomorrows visit).  Thanks!

## 2019-03-10 ENCOUNTER — Telehealth (INDEPENDENT_AMBULATORY_CARE_PROVIDER_SITE_OTHER): Payer: Medicare Other | Admitting: Neurology

## 2019-03-10 ENCOUNTER — Encounter: Payer: Self-pay | Admitting: Neurology

## 2019-03-10 ENCOUNTER — Other Ambulatory Visit: Payer: Self-pay

## 2019-03-10 DIAGNOSIS — R441 Visual hallucinations: Secondary | ICD-10-CM

## 2019-03-10 DIAGNOSIS — G20C Parkinsonism, unspecified: Secondary | ICD-10-CM

## 2019-03-10 DIAGNOSIS — G20 Parkinson's disease: Secondary | ICD-10-CM

## 2019-03-13 ENCOUNTER — Ambulatory Visit: Payer: Medicare Other

## 2019-03-13 ENCOUNTER — Telehealth: Payer: Self-pay

## 2019-03-13 ENCOUNTER — Ambulatory Visit: Payer: Medicare Other | Admitting: Neurology

## 2019-03-13 DIAGNOSIS — R441 Visual hallucinations: Secondary | ICD-10-CM

## 2019-03-13 DIAGNOSIS — Z5181 Encounter for therapeutic drug level monitoring: Secondary | ICD-10-CM

## 2019-03-13 NOTE — Telephone Encounter (Signed)
Order placed

## 2019-03-13 NOTE — Telephone Encounter (Signed)
Called patient to make him aware of appt scheduled at [facility] today at 1:45 pm  [place] [facility]  No answer left message that appt was scheduled w/ appt time/ date and location  Waiting on patient to confirm that he received this message and is ok with appt.

## 2019-03-13 NOTE — Telephone Encounter (Signed)
-----   Message from [place], DO sent at 03/10/2019  3:00 PM EDT ----- Please call [facility] (office of Dr. Neva Seat and Dr. Adrian Prows) and schedule EKG for this patient.  You will have to put in epic as well but this is NOT [facility] and I know its put in a little strangely.  Dx for the ekg is med monitor.

## 2019-03-15 DIAGNOSIS — Z7982 Long term (current) use of aspirin: Secondary | ICD-10-CM | POA: Diagnosis not present

## 2019-03-15 DIAGNOSIS — I69354 Hemiplegia and hemiparesis following cerebral infarction affecting left non-dominant side: Secondary | ICD-10-CM | POA: Diagnosis not present

## 2019-03-15 DIAGNOSIS — Z86718 Personal history of other venous thrombosis and embolism: Secondary | ICD-10-CM | POA: Diagnosis not present

## 2019-03-15 DIAGNOSIS — I10 Essential (primary) hypertension: Secondary | ICD-10-CM | POA: Diagnosis not present

## 2019-03-17 ENCOUNTER — Other Ambulatory Visit: Payer: Self-pay

## 2019-03-17 ENCOUNTER — Ambulatory Visit (INDEPENDENT_AMBULATORY_CARE_PROVIDER_SITE_OTHER): Payer: Medicare Other | Admitting: Cardiology

## 2019-03-17 DIAGNOSIS — R443 Hallucinations, unspecified: Secondary | ICD-10-CM

## 2019-03-17 DIAGNOSIS — I4581 Long QT syndrome: Secondary | ICD-10-CM | POA: Diagnosis not present

## 2019-03-17 DIAGNOSIS — Z5181 Encounter for therapeutic drug level monitoring: Secondary | ICD-10-CM

## 2019-03-17 NOTE — Progress Notes (Signed)
EKG only for drug monitoring. EKG 03/17/2019: Normal sinus rhythm at the rate of 75 bpm, normal axis, no evidence of ischemia, normal QT interval.

## 2019-03-22 DIAGNOSIS — I10 Essential (primary) hypertension: Secondary | ICD-10-CM | POA: Diagnosis not present

## 2019-03-22 DIAGNOSIS — Z86718 Personal history of other venous thrombosis and embolism: Secondary | ICD-10-CM | POA: Diagnosis not present

## 2019-03-22 DIAGNOSIS — Z7982 Long term (current) use of aspirin: Secondary | ICD-10-CM | POA: Diagnosis not present

## 2019-03-22 DIAGNOSIS — I69354 Hemiplegia and hemiparesis following cerebral infarction affecting left non-dominant side: Secondary | ICD-10-CM | POA: Diagnosis not present

## 2019-03-31 DIAGNOSIS — Z86718 Personal history of other venous thrombosis and embolism: Secondary | ICD-10-CM | POA: Diagnosis not present

## 2019-03-31 DIAGNOSIS — I10 Essential (primary) hypertension: Secondary | ICD-10-CM | POA: Diagnosis not present

## 2019-03-31 DIAGNOSIS — I69354 Hemiplegia and hemiparesis following cerebral infarction affecting left non-dominant side: Secondary | ICD-10-CM | POA: Diagnosis not present

## 2019-03-31 DIAGNOSIS — Z7982 Long term (current) use of aspirin: Secondary | ICD-10-CM | POA: Diagnosis not present

## 2019-04-03 ENCOUNTER — Telehealth: Payer: Self-pay | Admitting: Neurology

## 2019-04-03 DIAGNOSIS — Z7982 Long term (current) use of aspirin: Secondary | ICD-10-CM | POA: Diagnosis not present

## 2019-04-03 DIAGNOSIS — I69354 Hemiplegia and hemiparesis following cerebral infarction affecting left non-dominant side: Secondary | ICD-10-CM | POA: Diagnosis not present

## 2019-04-03 DIAGNOSIS — I10 Essential (primary) hypertension: Secondary | ICD-10-CM | POA: Diagnosis not present

## 2019-04-03 DIAGNOSIS — Z86718 Personal history of other venous thrombosis and embolism: Secondary | ICD-10-CM | POA: Diagnosis not present

## 2019-04-03 NOTE — Telephone Encounter (Signed)
Wife is calling in about heart test results. She is also wanting you to know Dr. Carles Collet reduced meds because of hallucinations and she was wanting to talk to someone about that. Thanks!

## 2019-04-03 NOTE — Telephone Encounter (Signed)
~  Dr. Carles Collet Please advise See below I do not see heart test results

## 2019-04-03 NOTE — Telephone Encounter (Signed)
Noted Pt wife will drop off form for nuplazid tomorrow No more then 30 day supply of samples will be given Will make her aware of this via phone

## 2019-04-03 NOTE — Telephone Encounter (Signed)
Pt spouse aware to have patient start today. Samples and form pick up. She will drop form back to office tomorrow

## 2019-04-03 NOTE — Telephone Encounter (Signed)
Patient's wife said that the samples given today are only good for 28 days and not 90 days. Thanks!

## 2019-04-03 NOTE — Telephone Encounter (Signed)
Yes I spoke with patient wife in regards to the 1 month supply  Are you ok with given him more samples?

## 2019-04-03 NOTE — Telephone Encounter (Signed)
If he is having hallucinations, then we should start the medication.  Fill out that form and I will need to sign it

## 2019-04-03 NOTE — Telephone Encounter (Signed)
Pt spouse will pick up samples and fill out form today. She says he has been doing better does he need to start now or wait to start medication?

## 2019-04-03 NOTE — Telephone Encounter (Signed)
nuplazid 34 mg samples given today.  Place at front desk for pick.  4 boxes 30 day supply

## 2019-04-03 NOTE — Telephone Encounter (Signed)
Why do we need to give them a 90 day supply of samples??  That is way beyond a sample limit.   Plus, if the insurance hasn't approved him after that time, the company will mail him samples.

## 2019-04-03 NOTE — Telephone Encounter (Signed)
EKG was fine.  Okay to start nuplazid.  Have them fill out forms if already haven't. Give them samples.  34 mg daily.  I have already discussed r/b/se with wife including black box warning.

## 2019-04-10 DIAGNOSIS — R05 Cough: Secondary | ICD-10-CM | POA: Diagnosis not present

## 2019-04-10 DIAGNOSIS — R441 Visual hallucinations: Secondary | ICD-10-CM | POA: Diagnosis not present

## 2019-04-10 DIAGNOSIS — F321 Major depressive disorder, single episode, moderate: Secondary | ICD-10-CM | POA: Diagnosis not present

## 2019-04-15 DIAGNOSIS — L02414 Cutaneous abscess of left upper limb: Secondary | ICD-10-CM | POA: Diagnosis not present

## 2019-04-18 DIAGNOSIS — N401 Enlarged prostate with lower urinary tract symptoms: Secondary | ICD-10-CM | POA: Diagnosis not present

## 2019-04-19 ENCOUNTER — Telehealth: Payer: Self-pay | Admitting: Neurology

## 2019-04-19 NOTE — Telephone Encounter (Signed)
Wife calling in about patient being Light headed, nauseous, lethargic since starting the Nuplazid medication about 2 1/2 weeks ago. She is wanting to take him off of it. Can she just stop or does he need to be slowly be taken off of it? She said to leave detailed msg if you don't get her. Thanks!

## 2019-04-19 NOTE — Telephone Encounter (Signed)
Hold medication for 2 weeks and then try to restart and see if happens again.  I haven't had that previously with this medication.  Take at bedtime next time.

## 2019-04-19 NOTE — Telephone Encounter (Signed)
Pt c/o:  side effect on medication New medication started: Nuplazid  When did they start medication?  04/03/19 When did side effects start? 3 days ago getting worst Side effects reported: light headed, nauseous, lethargic  Still taking medication? Yes.     Ok to leave detail message on voice mail if she does not answer

## 2019-04-19 NOTE — Telephone Encounter (Signed)
Called spoke with patient spouse [name] she was informed aware and understands

## 2019-04-24 ENCOUNTER — Telehealth: Payer: Self-pay | Admitting: Neurology

## 2019-04-24 DIAGNOSIS — N3020 Other chronic cystitis without hematuria: Secondary | ICD-10-CM | POA: Diagnosis not present

## 2019-04-24 NOTE — Telephone Encounter (Signed)
I can see him but don't think that it will help.  The medication for hallucinations (nuplazid) can take 6 weeks to work.

## 2019-04-24 NOTE — Telephone Encounter (Signed)
Pt c/o increasing tremor  Current tremor meds AND times they are taken carbidopa-levodopa ER  25/100 2/2/1 TID Pts next appointment: 07/18/2019  Pt called with c/o:  confusion/hallucinations New medications?  No. When did they start?  Started a week ago If hallucinations are new, has patient been checked for infection, including UTI?  No. Pt has appt with Urology today Current medications prescribed by Dr. Carles Collet and TIMES taking the medications: carbidopa-levodopa 25/100 2/2/1 TID

## 2019-04-24 NOTE — Telephone Encounter (Signed)
See prior phone call.  Didn't they hold the nuplazid?  Have they restarted it yet?

## 2019-04-24 NOTE — Telephone Encounter (Signed)
No he havent started back on Nuplazid yet She would like to know if he can been seen sooner then Sept Please advise  Will start nuplazid today

## 2019-04-24 NOTE — Telephone Encounter (Signed)
Wife called in on patient saying that he was nervous and anxious over the weekend. His tremors were really bad. He was also having hallucinations and depression. She was wanting to know if he could start back on the Nuplazid ASAP. She was having a tough time dealing with all this. Also, she asked if you needed to do a VV with him? And he has his yearly urologist visit today. Just FYI.  Please call her back at [phone]. Thanks!

## 2019-04-24 NOTE — Telephone Encounter (Signed)
Spoke with spouse she is aware and understands will start patient back on medication and keep follow up in Sept.

## 2019-04-25 DIAGNOSIS — I69354 Hemiplegia and hemiparesis following cerebral infarction affecting left non-dominant side: Secondary | ICD-10-CM | POA: Diagnosis not present

## 2019-04-25 DIAGNOSIS — Z86718 Personal history of other venous thrombosis and embolism: Secondary | ICD-10-CM | POA: Diagnosis not present

## 2019-04-25 DIAGNOSIS — I10 Essential (primary) hypertension: Secondary | ICD-10-CM | POA: Diagnosis not present

## 2019-04-25 DIAGNOSIS — Z7982 Long term (current) use of aspirin: Secondary | ICD-10-CM | POA: Diagnosis not present

## 2019-06-09 ENCOUNTER — Telehealth: Payer: Self-pay | Admitting: Neurology

## 2019-06-09 NOTE — Telephone Encounter (Signed)
Make video visit appt

## 2019-06-09 NOTE — Telephone Encounter (Signed)
Wife is calling in regarding Nuplazid medication. She said she is not noticing any difference in him. He is still experiencing hallucinations and the same symptoms. She is wanting to see what else can be done for him. Please call her back at [phone]. Thanks!

## 2019-06-09 NOTE — Telephone Encounter (Signed)
Pt has virtual appt on 06-21-19 and needs to talk to someone about if she is to continue the medication until then please call

## 2019-06-09 NOTE — Telephone Encounter (Signed)
yes

## 2019-06-09 NOTE — Telephone Encounter (Signed)
Followed last 3-4 telephone note from June regarding this. Please advise next steps

## 2019-06-09 NOTE — Telephone Encounter (Signed)
Called spoke with spouse she was made aware of this

## 2019-06-19 NOTE — Progress Notes (Signed)
Virtual Visit via Video Note  The purpose of this virtual visit is to provide medical care while limiting exposure to the novel coronavirus.    Consent was obtained for video visit:  Yes.   Answered questions that patient had about telehealth interaction:  Yes.   I discussed the limitations, risks, security and privacy concerns of performing an evaluation and management service by telemedicine. I also discussed with the patient that there may be a patient responsible charge related to this service. The patient expressed understanding and agreed to proceed.  Pt location: Home Physician Location: home Name of referring provider:  [name], MD I connected with [name] at patients initiation/request on 06/21/2019 at  8:45 AM EDT by video enabled telemedicine application and verified that I am speaking with the correct person using two identifiers. 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  Last visit, we decreased his carbidopa/levodopa 25/100 CR, just a little bit so that he went from 6 tablets/day to 5 tablets/day (2 tablets in the morning, 2 in the middle of the day and 1 in the evening).  His biggest difficulty has been with confusion and hallucinations.  We started him on Nuplazid last visit.  Wife called many times stating that it was not beneficial but today states that he is doing some better.  Pt states that "I am calmer."  He has less numbers of hallucinations.  Wife states that he will look out into another room a lot and when she asks why, he may admit to seeing things.  He denies that today.  States that he is not scared if he does see something.   Wife also c/o pt having trouble sleeping  Current Outpatient Medications on File Prior to Visit  Medication Sig Dispense  Refill  . aspirin 81 MG tablet Take 81 mg by mouth daily.    . Carbidopa-Levodopa ER (SINEMET CR) 25-100 MG tablet controlled release TAKE TWO TABLETS BY MOUTH THREE TIMES DAILY  (Patient taking differently: 2 tablets 2 (two) times daily. Take 2 in AM , 1 in PM) 180 tablet 4  . losartan (COZAAR) 50 MG tablet Take 25 mg by mouth every morning.     . Pimavanserin Tartrate 34 MG CAPS Take by mouth.    . polyethylene glycol (MIRALAX) packet Take 17 g by mouth daily. 14 each 0  . simvastatin (ZOCOR) 20 MG tablet Take 20 mg by mouth every evening.     No current facility-administered medications on file prior to visit.       Observations/Objective:   Vitals:   06/21/19 0839  Weight: 230 lb (104.3 kg)  Height: 6\' 2"  (1.88 m)   GEN:  The patient appears stated age and is in NAD.  Neurological examination:  Orientation: The patient is alert and oriented x3. Cranial nerves: There is good facial symmetry. 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  The speech is fluent and clear. Soft palate rises symmetrically and there is no tongue deviation. Hearing is decreased to conversational tone. Motor: Strength is at least antigravity on the R  Movement examination: Tone: unable Abnormal movements: unable to see hands at rest via video Coordination:  No troubles with RAMs on the the R Gait and Station: sitting on hospital bed.  Not tested    Assessment  and Plan:   1.  RUE tremor with hx of large MCA infarct, causing L hemiparesis             -The patient had a DaT scan on December 15, 2017 which was positive for loss of dopamine on the left (and loss of dopamine on the right due to prior infarct).  He and I discussed that this is not a diagnostic scan.  He was unable to tolerate immediate release levodopa in the past due to dizziness.  He is now on extended release.  He is tolerating it well.  He has looked more parkinsonian with time.  -Carbidopa/levodopa 25/100 CR, 1 tablet 5 times per day.  Seems to be doing  better on this than 6 tablets/day (in terms of cognition).  2.  Recent hallucinations and trouble sleeping/insomnia  -Wife called many times stating that Nuplazid was not helpful, but I got a different story today.  It seems like it actually has been quite helpful, and that hallucinations are fairly mild.  The bigger issue may be that he is not sleeping at night and is having some mild hallucinations in the day.  -Start quetiapine, 25 mg at night for 1 week.  Long discussion with patient and his wife about the interaction between new placid and quetiapine and risk for prolonged QT and what that exactly meant.  He expressed understanding and willingness to proceed.  Discussed extensively risk, benefits, and side effects, including but not limited to the black box warning.    -Needs 24/day care.  Wife expressed understanding.  She does tell me that despite the fact that she understands, she has to work he needs to refill alone during the day.  I asked her if she could potentially work from home (she works at a medical office at the front desk).  She does not think that is possible.  She cannot afford in-home care.  She states that she plans to retire next year.  We talked about other alternatives, and ultimately I just told her that I was not sure the solution, but that he needs 24 hour/day care.  -Patient's wife is to call me on Monday and when me know how he did with the above changes.  She will let me know if he is too sleepy.             Follow Up Instructions:  Patient has an in office appointment in September.  They would like to keep that.   -I discussed the assessment and treatment plan with the patient. The patient was provided an opportunity to ask questions and all were answered. The patient agreed with the plan and demonstrated an understanding of the instructions.   The patient was advised to call back or seek an in-person evaluation if the symptoms worsen or if the condition fails to  improve as anticipated.    Total Time spent in visit with the patient was:  15 min, of which more than 50% of the time was spent in counseling and/or coordinating care on safety.   Pt understands and agrees with the plan of care outlined.     Alonza Bogus, DO

## 2019-06-21 ENCOUNTER — Telehealth (INDEPENDENT_AMBULATORY_CARE_PROVIDER_SITE_OTHER): Payer: Medicare Other | Admitting: Neurology

## 2019-06-21 ENCOUNTER — Other Ambulatory Visit: Payer: Self-pay

## 2019-06-21 ENCOUNTER — Encounter: Payer: Self-pay | Admitting: Neurology

## 2019-06-21 VITALS — Ht 74.0 in | Wt 230.0 lb

## 2019-06-21 DIAGNOSIS — G20 Parkinson's disease: Secondary | ICD-10-CM | POA: Diagnosis not present

## 2019-06-21 DIAGNOSIS — R441 Visual hallucinations: Secondary | ICD-10-CM | POA: Diagnosis not present

## 2019-06-21 DIAGNOSIS — G4700 Insomnia, unspecified: Secondary | ICD-10-CM | POA: Diagnosis not present

## 2019-06-29 DIAGNOSIS — R509 Fever, unspecified: Secondary | ICD-10-CM | POA: Diagnosis not present

## 2019-06-29 DIAGNOSIS — Z20828 Contact with and (suspected) exposure to other viral communicable diseases: Secondary | ICD-10-CM | POA: Diagnosis not present

## 2019-06-30 DIAGNOSIS — R509 Fever, unspecified: Secondary | ICD-10-CM | POA: Diagnosis not present

## 2019-07-05 DIAGNOSIS — R531 Weakness: Secondary | ICD-10-CM | POA: Diagnosis not present

## 2019-07-05 DIAGNOSIS — E119 Type 2 diabetes mellitus without complications: Secondary | ICD-10-CM | POA: Diagnosis not present

## 2019-07-05 DIAGNOSIS — E1169 Type 2 diabetes mellitus with other specified complication: Secondary | ICD-10-CM | POA: Diagnosis not present

## 2019-07-18 ENCOUNTER — Other Ambulatory Visit: Payer: Self-pay

## 2019-07-18 ENCOUNTER — Ambulatory Visit (INDEPENDENT_AMBULATORY_CARE_PROVIDER_SITE_OTHER): Payer: Medicare Other | Admitting: Neurology

## 2019-07-18 ENCOUNTER — Encounter: Payer: Self-pay | Admitting: Neurology

## 2019-07-18 VITALS — BP 126/74 | HR 72 | Temp 98.5°F | Resp 12 | Ht 74.0 in | Wt 232.0 lb

## 2019-07-18 DIAGNOSIS — R441 Visual hallucinations: Secondary | ICD-10-CM | POA: Diagnosis not present

## 2019-07-18 DIAGNOSIS — G20 Parkinson's disease: Secondary | ICD-10-CM | POA: Diagnosis not present

## 2019-07-18 DIAGNOSIS — N390 Urinary tract infection, site not specified: Secondary | ICD-10-CM

## 2019-07-18 DIAGNOSIS — G20C Parkinsonism, unspecified: Secondary | ICD-10-CM

## 2019-07-18 NOTE — Patient Instructions (Signed)
I'm not changing any medication today.  The physicians and staff at [facility] are committed to providing excellent care. You may receive a survey requesting feedback about your experience at our office. We strive to receive "very good" responses to the survey questions. If you feel that your experience would prevent you from giving the office a "very good " response, please contact our office to try to remedy the situation. We may be reached at [phone]. Thank you for taking the time out of your busy day to complete the survey.

## 2019-07-18 NOTE — Progress Notes (Signed)
Robert [name] was seen today in the movement disorders clinic for neurologic consultation at the request of [name], MD.  The consultation is for the evaluation of 2nd opinion re: PD.  Pt currently under the care of [name]. The records that were made available to me were reviewed.  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 in July, 2018 for the evaluation of tremor.  He was diagnosed with Parkinson's disease in July, 2018 and started on carbidopa/levodopa 25/100 and was to work to 1 tablet 3 times per day.  He called after starting the medication because of dizziness and what sounds like some cognitive change and the dose was decreased from 1 tablet 3 times per day to half a tablet 3 times per day.  They called Dr. [name] office in January, 2019 to state that when they tried to increase the medication, he got lightheaded again.  He was told to discontinue the levodopa and to ask his primary care physician for another opinion, which is the reason he presents today.  He does state that the lightheadedness is better now that off carbidopa/levodopa 25/100 but also states that his BP medication was cut back the same day , from 50 mg to 25 mg  Specific Symptoms:  Tremor: Yes.  , R arm and R leg a little Family hx of similar:  No.  Voice: softer Sleep:   Vivid Dreams:  No.  Acting out dreams:  No. Wet Pillows: No. Postural symptoms:  Yes.  , but related to stroke 10 years ago and balance change has been stable over 5+ years.  Stroke was over 10 years ago  Falls?  No. Bradykinesia symptoms: difficulty getting out of a chair, due to stroke per patient/wife Loss of smell:  No. Loss of taste:  No. Urinary Incontinence:  No. - gets nocturia 1-2 times per night Difficulty Swallowing:  Yes.  , mostly with saliva.  Has to swallow liquid slow but not different since stroke.   Handwriting, micrographia: Yes.    Trouble with ADL's:  Yes.  , because of stroke   Trouble buttoning clothing: Yes.   Depression:  No. Memory changes:  Yes.  , some short term memory Hallucinations:  No.  visual distortions: Yes.   N/V:  No. Lightheaded:  Yes.   - better right now after d/c of carbidopa/levodopa  Syncope: No. Diplopia:  No. Dyskinesia:  No.   MRI of the brain from June, 2018 was personally reviewed by me.  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  12/31/17 update: The patient is seen today in follow-up.  The patient had a DaT scan on December 23, 2017.  I have reviewed these images.  This demonstrated decreased activity within the left putamen.  There was absent activity in the right basal ganglia, related to his prior large MCA infarct.  Wife also notes that the patient is having trouble sleeping  04/05/18 update: Patient seen today in follow-up.  Patient was started on carbidopa/levodopa 25/100 CR, 1 tablet 3 times per day since our last visit.  His wife called and stated that he was still quite tremulous.  Medication was increased to 2 tablets in the morning, 1 in the afternoon and 1 in the evening.  They didn't make this increase and he is still on carbidopa/levodopa 25/100 CR tid.  They have trouble deciphering whether medication is helpful.  Prior to this is because he is always slow because of prior history of large right MCA infarct.  He still has tremor on the right hand, which is really the only thing they can go by.  08/23/18 update:  Pt seen in f/u for tremor and hx of MCA infarct.  This patient is accompanied in the office by his spouse who supplements the history.  He is on carbidopa/levodopa 25/100 CR, 2 in the AM, 2 in the afternoon and 1 in the evening.  He reports that there are times in the day, usually mid day, he is not shaking.  Not sure if medication helps.  Biggest time it is bothersome is when goes to bed at night.  Doesn't want to change medication because overall "doing well."  No  falls. Occasionally will have a hallucination but if closes eye will go away.  Arises out of sleep.  Will wake up and then see someone and then close eyes and open again and it will be gone.  Never happens in the day.  07/18/19 update: Patient seen today in follow-up for his tremor and history of MCA infarct.  I saw him last month for his hallucinations and trouble sleeping.  He was already on Nuplazid, which was actually helpful for the hallucinations, but did not take them away.  The bigger issue was his lack of sleep.  We cautiously started him on low-dose quetiapine, 25 mg at bedtime.  They understood the potential interaction between these medications.  Today, they state that they never picked it up from the pharmacy but he has been sleeping good and hallucinations are fairly well controlled.  If he sees a person, he states that the head is never present and he knows it is not real.  He is still on carbidopa/levodopa 25/100 CR, 1 tablet 5 times per day.  Wife notes a preoccupation with sex and wondered if it was from a Nuplazid.  It has not become a problem within the marriage, or out of the marriage.  Wife notes that patient had another urinary tract infection and is just getting off of antibiotics.  PREVIOUS MEDICATIONS: Sinemet  ALLERGIES:   Allergies  Allergen Reactions   Propofol Other (See Comments)    "hiccups for weeks"    CURRENT MEDICATIONS:  Outpatient Encounter Medications as of 07/18/2019  Medication Sig   aspirin 81 MG tablet Take 81 mg by mouth daily.   Carbidopa-Levodopa ER (SINEMET CR) 25-100 MG tablet controlled release TAKE TWO TABLETS BY MOUTH THREE TIMES DAILY  (Patient taking differently: 2 tablets 2 (two) times daily. Take 2 in AM , 1 in PM)   losartan (COZAAR) 50 MG tablet Take 25 mg by mouth every morning.    Pimavanserin Tartrate 34 MG CAPS Take by mouth.   polyethylene glycol (MIRALAX) packet Take 17 g by mouth daily.   simvastatin (ZOCOR) 20 MG tablet Take  20 mg by mouth every evening.   No facility-administered encounter medications on file as of 07/18/2019.     PAST MEDICAL HISTORY:   Past Medical History:  Diagnosis Date   CVA (cerebral vascular accident) ([place])    with left sided hemiparesis   Diverticulosis    Frequency of urination    GERD (gastroesophageal reflux disease)    Gross hematuria    History of acute pyelonephritis    10-13-2012   History of CVA with residual deficit    2008--  left side of body weakness and foot drop (wears leg brace  and uses cane)   History of DVT of lower extremity    2008--  cva   Hypercholesteremia    Hyperlipidemia    Hyperlipidemia    Hypertension    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 2008   Left leg DVT ([facility])    S/P insertion of IVC (inferior vena caval) filter    2008   Urethral stricture    Urgency of urination    Weakness of left side of body    secondary to cva 2008   Wears glasses    Wears hearing aid    bilateral-- wears intermittantly    PAST SURGICAL HISTORY:   Past Surgical History:  Procedure Laterality Date   CYSTOSCOPY WITH RETROGRADE URETHROGRAM N/A 10/23/2015   Procedure: CYSTOSCOPY WITH RETROGRADE URETHROGRAM;  Surgeon: [name], MD;  Location: [facility];  Service: Urology;  Laterality: N/A;   CYSTOSCOPY WITH URETHRAL DILATATION N/A 10/23/2015   Procedure: CYSTOSCOPY WITH URETHRAL BALLOON DILATATION;  Surgeon: [name], MD;  [facility];  Service: Urology;  Laterality: N/A;  BALLOON DILATION    IVC FILTER PLACEMENT ([place] [facility])  2008    SOCIAL HISTORY:   Social History   Socioeconomic History   Marital status: Married    Spouse name: [name]   Number of children: 1   Years of education: 12   Highest education level: High school graduate  Occupational History   Occupation: retired    Comment: Dealer strain: Not on file   Food  insecurity    Worry: Not on file    Inability: Not on Lexicographer needs    Medical: Not on file    Non-medical: Not on file  Tobacco Use   Smoking status: Former Smoker    Years: 10.00    Types: Cigarettes    Quit date: 10/26/1970    Years since quitting: 48.7   Smokeless tobacco: Never Used  Substance and Sexual Activity   Alcohol use: No   Drug use: No   Sexual activity: Not on file  Lifestyle   Physical activity    Days per week: Not on file    Minutes per session: Not on file   Stress: Not on file  Relationships   Social connections    Talks on phone: Not on file    Gets together: Not on file    Attends religious service: Not on file    Active member of club or organization: Not on file    Attends meetings of clubs or organizations: Not on file    Relationship status: Not on file   Intimate partner violence    Fear of current or ex partner: Not on file    Emotionally abused: Not on file    Physically abused: Not on file    Forced sexual activity: Not on file  Other Topics Concern   Not on file  Social History Narrative   Lives in [place] with wife. He is a English as a second language teacher.   Has one son who lives in [place]. He is healthy.   [facility] [place], [place].      Patient is right-handed. He lives with his wife in a one level home.    FAMILY HISTORY:   Family Status  Relation Name Status   Sister 3 Alive   Brother 54 Alive   Mother  Deceased   Father  Deceased  Son  Alive    ROS:  Review of Systems  Constitutional: Positive for malaise/fatigue.  HENT: Negative.   Eyes: Negative.   Respiratory: Negative.   Cardiovascular: Negative.   Gastrointestinal: Negative.   Skin: Negative.   Psychiatric/Behavioral: Positive for hallucinations.    PHYSICAL EXAMINATION:    VITALS:   Vitals:   07/18/19 1054  BP: 126/74  Pulse: 72  Resp: 12  Temp: 98.5 F (36.9 C)  SpO2: 94%  Weight: 232 lb (105.2 kg)  Height: 6'  2" (1.88 m)   No data found.   GEN:  The patient appears stated age and is in NAD. HEENT:  Normocephalic, atraumatic.  The mucous membranes are moist. The superficial temporal arteries are without ropiness or tenderness. CV:  rrr Lungs:  ctab Neck/HEME:  There are no carotid bruits bilaterally.  Neurological examination:  Orientation: The patient is alert and oriented x3.  Cranial nerves: There is mild L facial droop with ptosis on the L.   The speech is fluent, hypophonic, intermittently dysarthric. Soft palate rises symmetrically and there is no tongue deviation. Hearing is intact to conversational tone. Sensation: Sensation is intact to light touch x 4 Motor: L arm is held flexed position.  Fingers are flexed on the L but easily passively stretched.  He is able to raise L arm from the shoulder (shoulder shrug) but otherwise strength in the LUE (including deltoid, biceps, triceps) strength is 0/5 on the LUE.  Strength in the L hip flexors is 3/5 and same for L knee flexors.  He has more trouble with hip extension but strength is still 3-/5.  He has a great deal of trouble with knee extension on the L.  Strength is 5/5 in the right upper and lower extremities.     Movement examination: Tone: There is spasticity in the LUE/LLE.  Once I was able to get him to relax, there was no rigidity in the right upper or right lower extremity. Abnormal movements: There was mild RUE resting tremor. Coordination:  There is no decremation today with hand opening and closing her finger taps. Gait and Station: not tested today as in transport chair today  Labs  I reviewed lab work from the primary care physician dated June 14, 2017.  Sodium was 141, potassium 4.3, chloride 106, CO2 30, BUN 15, creatinine 1.04, glucose 139, AST 21, ALT 12, alkaline phosphatase 70.  ASSESSMENT/PLAN:  1.  RUE tremor with hx of large MCA infarct, causing L hemiparesis  -The patient had a DaT scan on December 15, 2017  which was positive for loss of dopamine on the left (and loss of dopamine on the right due to prior infarct).  He and I discussed that this is not a diagnostic scan.  He was unable to tolerate immediate release levodopa in the past due to dizziness.  He is now on extended release.  He is on carbidopa/levodopa 25/100 CR, 2 tablets in the morning, 2 in the afternoon and 1 in the evening.  This seems to be working well.  He had more trouble with higher dosages in terms of cognition.  He will remain on this.  We did discuss that this could be the source of the compulsive behavior (preoccupation with sex).  Wife/pt both agree that this is not a problem and did not want me to lower the dose.   2.  Hallucinations and insomnia  -Nuplazid definitely decreased the hallucinations, but did not take them all away.  However, because of  the decrease in sx's, we decided to leave him on that.  Wife thinks that he is doing markedly better on the medication than off of it.  -Patient was given seroquel but never picked it up.  Wife/pt think that doing much better in terms of hallucinations and sleep so will remain off of it for now  3.  Frequent UTI  -told them to f/u with urology.  May need suppressive abx   4.  Follow up is anticipated in the next 4-6 months, sooner should new neurologic issues arise.  Much greater than 50% of this visit was spent in counseling and coordinating care.  Total face to face time:  25 min     Cc:  [name], MD

## 2019-08-07 ENCOUNTER — Other Ambulatory Visit: Payer: Self-pay | Admitting: Family Medicine

## 2019-08-07 ENCOUNTER — Ambulatory Visit
Admission: RE | Admit: 2019-08-07 | Discharge: 2019-08-07 | Disposition: A | Discharge status: Home / Self Care | Payer: Medicare Other | Source: Ambulatory Visit | Attending: Family Medicine | Admitting: Family Medicine

## 2019-08-07 DIAGNOSIS — R05 Cough: Secondary | ICD-10-CM | POA: Diagnosis not present

## 2019-08-07 DIAGNOSIS — R059 Cough, unspecified: Secondary | ICD-10-CM

## 2019-08-10 ENCOUNTER — Telehealth: Payer: Self-pay | Admitting: Neurology

## 2019-08-10 NOTE — Telephone Encounter (Signed)
No.  It doesn't affect BP or cause enlarged heart.  That usually happens over a long period of time

## 2019-08-10 NOTE — Telephone Encounter (Signed)
Patient's wife called regarding her husband Robert [name]. She said that he recently had a Chest X ray taken and it showed that he had an enlarged heart. She said he has never been diagnosed with an enlarged heart. Dr. Deniece Ree is telling him that it is caused by his Blood Pressure medication. Sh said he has been on that for years. She wanted to let Dr. Carles Collet know and check to see if the Nuplazid he is taking could effect his heart like that? She is waiting to have a referral sent for him to a Heart Doctor. Please Call [phone]. Thanks

## 2019-08-10 NOTE — Telephone Encounter (Signed)
See below. Please advise.  

## 2019-08-11 NOTE — Telephone Encounter (Signed)
Spoke with patient spouse she was informed of provider response.

## 2019-08-13 ENCOUNTER — Emergency Department (HOSPITAL_COMMUNITY): Payer: Medicare Other

## 2019-08-13 ENCOUNTER — Encounter (HOSPITAL_COMMUNITY): Payer: Self-pay | Admitting: Emergency Medicine

## 2019-08-13 ENCOUNTER — Inpatient Hospital Stay (HOSPITAL_COMMUNITY)
Admission: EM | Admit: 2019-08-13 | Discharge: 2019-08-19 | DRG: 871 | Disposition: A | Discharge status: Skilled Nursing Facility | Payer: Medicare Other | Attending: Internal Medicine | Admitting: Internal Medicine

## 2019-08-13 ENCOUNTER — Other Ambulatory Visit: Payer: Self-pay

## 2019-08-13 DIAGNOSIS — I69354 Hemiplegia and hemiparesis following cerebral infarction affecting left non-dominant side: Secondary | ICD-10-CM

## 2019-08-13 DIAGNOSIS — A409 Streptococcal sepsis, unspecified: Secondary | ICD-10-CM | POA: Diagnosis not present

## 2019-08-13 DIAGNOSIS — R000 Tachycardia, unspecified: Secondary | ICD-10-CM | POA: Diagnosis not present

## 2019-08-13 DIAGNOSIS — Z86718 Personal history of other venous thrombosis and embolism: Secondary | ICD-10-CM

## 2019-08-13 DIAGNOSIS — N2889 Other specified disorders of kidney and ureter: Secondary | ICD-10-CM | POA: Diagnosis not present

## 2019-08-13 DIAGNOSIS — R778 Other specified abnormalities of plasma proteins: Secondary | ICD-10-CM | POA: Diagnosis present

## 2019-08-13 DIAGNOSIS — Z20828 Contact with and (suspected) exposure to other viral communicable diseases: Secondary | ICD-10-CM | POA: Diagnosis present

## 2019-08-13 DIAGNOSIS — Z8042 Family history of malignant neoplasm of prostate: Secondary | ICD-10-CM

## 2019-08-13 DIAGNOSIS — Z801 Family history of malignant neoplasm of trachea, bronchus and lung: Secondary | ICD-10-CM

## 2019-08-13 DIAGNOSIS — E872 Acidosis: Secondary | ICD-10-CM | POA: Diagnosis not present

## 2019-08-13 DIAGNOSIS — R531 Weakness: Secondary | ICD-10-CM | POA: Diagnosis not present

## 2019-08-13 DIAGNOSIS — R339 Retention of urine, unspecified: Secondary | ICD-10-CM | POA: Diagnosis not present

## 2019-08-13 DIAGNOSIS — R338 Other retention of urine: Secondary | ICD-10-CM | POA: Diagnosis present

## 2019-08-13 DIAGNOSIS — I951 Orthostatic hypotension: Secondary | ICD-10-CM | POA: Diagnosis present

## 2019-08-13 DIAGNOSIS — Z87440 Personal history of urinary (tract) infections: Secondary | ICD-10-CM

## 2019-08-13 DIAGNOSIS — N400 Enlarged prostate without lower urinary tract symptoms: Secondary | ICD-10-CM | POA: Diagnosis present

## 2019-08-13 DIAGNOSIS — Z820 Family history of epilepsy and other diseases of the nervous system: Secondary | ICD-10-CM

## 2019-08-13 DIAGNOSIS — N401 Enlarged prostate with lower urinary tract symptoms: Secondary | ICD-10-CM | POA: Diagnosis present

## 2019-08-13 DIAGNOSIS — N3001 Acute cystitis with hematuria: Secondary | ICD-10-CM | POA: Diagnosis not present

## 2019-08-13 DIAGNOSIS — E785 Hyperlipidemia, unspecified: Secondary | ICD-10-CM | POA: Diagnosis present

## 2019-08-13 DIAGNOSIS — Z7982 Long term (current) use of aspirin: Secondary | ICD-10-CM

## 2019-08-13 DIAGNOSIS — I82412 Acute embolism and thrombosis of left femoral vein: Secondary | ICD-10-CM | POA: Diagnosis present

## 2019-08-13 DIAGNOSIS — M21372 Foot drop, left foot: Secondary | ICD-10-CM | POA: Diagnosis present

## 2019-08-13 DIAGNOSIS — G9341 Metabolic encephalopathy: Secondary | ICD-10-CM | POA: Diagnosis not present

## 2019-08-13 DIAGNOSIS — N390 Urinary tract infection, site not specified: Secondary | ICD-10-CM | POA: Diagnosis present

## 2019-08-13 DIAGNOSIS — I10 Essential (primary) hypertension: Secondary | ICD-10-CM | POA: Diagnosis present

## 2019-08-13 DIAGNOSIS — Z95828 Presence of other vascular implants and grafts: Secondary | ICD-10-CM

## 2019-08-13 DIAGNOSIS — N179 Acute kidney failure, unspecified: Secondary | ICD-10-CM | POA: Diagnosis present

## 2019-08-13 DIAGNOSIS — Z833 Family history of diabetes mellitus: Secondary | ICD-10-CM

## 2019-08-13 DIAGNOSIS — R319 Hematuria, unspecified: Secondary | ICD-10-CM | POA: Diagnosis present

## 2019-08-13 DIAGNOSIS — R600 Localized edema: Secondary | ICD-10-CM | POA: Diagnosis present

## 2019-08-13 DIAGNOSIS — G20 Parkinson's disease: Secondary | ICD-10-CM | POA: Diagnosis present

## 2019-08-13 DIAGNOSIS — R110 Nausea: Secondary | ICD-10-CM | POA: Diagnosis not present

## 2019-08-13 DIAGNOSIS — E1165 Type 2 diabetes mellitus with hyperglycemia: Secondary | ICD-10-CM | POA: Diagnosis not present

## 2019-08-13 DIAGNOSIS — H9190 Unspecified hearing loss, unspecified ear: Secondary | ICD-10-CM

## 2019-08-13 DIAGNOSIS — N138 Other obstructive and reflux uropathy: Secondary | ICD-10-CM | POA: Diagnosis present

## 2019-08-13 DIAGNOSIS — R61 Generalized hyperhidrosis: Secondary | ICD-10-CM | POA: Diagnosis not present

## 2019-08-13 DIAGNOSIS — R6520 Severe sepsis without septic shock: Secondary | ICD-10-CM | POA: Diagnosis present

## 2019-08-13 DIAGNOSIS — Z87891 Personal history of nicotine dependence: Secondary | ICD-10-CM

## 2019-08-13 DIAGNOSIS — R001 Bradycardia, unspecified: Secondary | ICD-10-CM | POA: Diagnosis not present

## 2019-08-13 HISTORY — DX: Benign prostatic hyperplasia without lower urinary tract symptoms: N40.0

## 2019-08-13 HISTORY — DX: Retention of urine, unspecified: R33.9

## 2019-08-13 LAB — URINALYSIS, ROUTINE W REFLEX MICROSCOPIC
Bilirubin Urine: NEGATIVE
Glucose, UA: 50 mg/dL — AB
Ketones, ur: 5 mg/dL — AB
Nitrite: NEGATIVE
Protein, ur: 100 mg/dL — AB
RBC / HPF: >50 RBC/hpf — ABNORMAL HIGH (ref 0–5)
Specific Gravity, Urine: 1.019 (ref 1.005–1.030)
WBC, UA: >50 WBC/hpf — ABNORMAL HIGH (ref 0–5)
pH: 5 (ref 5.0–8.0)

## 2019-08-13 LAB — COMPREHENSIVE METABOLIC PANEL
ALT: 9 U/L (ref 0–44)
AST: 25 U/L (ref 15–41)
Albumin: 3.8 g/dL (ref 3.5–5.0)
Alkaline Phosphatase: 69 U/L (ref 38–126)
Anion gap: 14 (ref 5–15)
BUN: 16 mg/dL (ref 8–23)
CO2: 21 mmol/L — ABNORMAL LOW (ref 22–32)
Calcium: 9.2 mg/dL (ref 8.9–10.3)
Chloride: 103 mmol/L (ref 98–111)
Creatinine, Ser: 1.34 mg/dL — ABNORMAL HIGH (ref 0.61–1.24)
GFR calc Af Amer: >60 mL/min (ref >60)
GFR calc non Af Amer: 53 mL/min — ABNORMAL LOW (ref >60)
Glucose, Bld: 211 mg/dL — ABNORMAL HIGH (ref 70–99)
Potassium: 4.2 mmol/L (ref 3.5–5.1)
Sodium: 138 mmol/L (ref 135–145)
Total Bilirubin: 1.3 mg/dL — ABNORMAL HIGH (ref 0.3–1.2)
Total Protein: 7.1 g/dL (ref 6.5–8.1)

## 2019-08-13 LAB — CBC WITH DIFFERENTIAL/PLATELET
Abs Immature Granulocytes: 0.04 10*3/uL (ref 0.00–0.07)
Basophils Absolute: 0 10*3/uL (ref 0.0–0.1)
Basophils Relative: 0 %
Eosinophils Absolute: 0 10*3/uL (ref 0.0–0.5)
Eosinophils Relative: 0 %
HCT: 49.1 % (ref 39.0–52.0)
Hemoglobin: 15.8 g/dL (ref 13.0–17.0)
Immature Granulocytes: 0 %
Lymphocytes Relative: 7 %
Lymphs Abs: 0.6 10*3/uL — ABNORMAL LOW (ref 0.7–4.0)
MCH: 29.9 pg (ref 26.0–34.0)
MCHC: 32.2 g/dL (ref 30.0–36.0)
MCV: 92.8 fL (ref 80.0–100.0)
Monocytes Absolute: 0.8 10*3/uL (ref 0.1–1.0)
Monocytes Relative: 9 %
Neutro Abs: 7.6 10*3/uL (ref 1.7–7.7)
Neutrophils Relative %: 84 %
Platelets: 249 10*3/uL (ref 150–400)
RBC: 5.29 MIL/uL (ref 4.22–5.81)
RDW: 13.8 % (ref 11.5–15.5)
WBC: 9.1 10*3/uL (ref 4.0–10.5)
nRBC: 0 % (ref 0.0–0.2)

## 2019-08-13 LAB — TROPONIN I (HIGH SENSITIVITY)
Troponin I (High Sensitivity): 156 ng/L (ref <18)
Troponin I (High Sensitivity): 127 ng/L (ref <18)

## 2019-08-13 LAB — LACTIC ACID, PLASMA
Lactic Acid, Venous: 2 mmol/L (ref 0.5–1.9)
Lactic Acid, Venous: 1.3 mmol/L (ref 0.5–1.9)

## 2019-08-13 MED ORDER — LIDOCAINE HCL URETHRAL/MUCOSAL 2 % EX GEL
1.0000 "application " | Freq: Once | CUTANEOUS | Status: AC
  Administered 2019-08-13: 1 via TOPICAL
  Filled 2019-08-13: qty 20

## 2019-08-13 MED ORDER — ASPIRIN 81 MG PO CHEW
81.0000 mg | CHEWABLE_TABLET | Freq: Every day | ORAL | Status: DC
  Administered 2019-08-14 – 2019-08-15 (×2): 81 mg via ORAL
  Filled 2019-08-13 (×2): qty 1

## 2019-08-13 MED ORDER — ENOXAPARIN SODIUM 40 MG/0.4ML ~~LOC~~ SOLN
40.0000 mg | SUBCUTANEOUS | Status: DC
  Administered 2019-08-14: 40 mg via SUBCUTANEOUS
  Filled 2019-08-13 (×2): qty 0.4

## 2019-08-13 MED ORDER — SODIUM CHLORIDE 0.9 % IV SOLN
INTRAVENOUS | Status: AC
  Administered 2019-08-14: 01:00:00 via INTRAVENOUS

## 2019-08-13 MED ORDER — SODIUM CHLORIDE 0.9 % IV SOLN
1.0000 g | Freq: Once | INTRAVENOUS | Status: AC
  Administered 2019-08-13: 1 g via INTRAVENOUS
  Filled 2019-08-13: qty 10

## 2019-08-13 MED ORDER — ACETAMINOPHEN 650 MG RE SUPP: 650.0000 mg | Freq: Four times a day (QID) | RECTAL | Status: DC | PRN

## 2019-08-13 MED ORDER — SODIUM CHLORIDE 0.9 % IV SOLN: 1.0000 g | INTRAVENOUS | Status: DC

## 2019-08-13 MED ORDER — ONDANSETRON HCL 4 MG/2ML IJ SOLN: 4.0000 mg | Freq: Four times a day (QID) | INTRAMUSCULAR | Status: DC | PRN

## 2019-08-13 MED ORDER — CARBIDOPA-LEVODOPA ER 25-100 MG PO TBCR: 1.0000 | EXTENDED_RELEASE_TABLET | ORAL | Status: DC

## 2019-08-13 MED ORDER — SIMVASTATIN 20 MG PO TABS
20.0000 mg | ORAL_TABLET | Freq: Every evening | ORAL | Status: DC
  Administered 2019-08-14 – 2019-08-19 (×6): 20 mg via ORAL
  Filled 2019-08-13 (×6): qty 1

## 2019-08-13 MED ORDER — ONDANSETRON HCL 4 MG PO TABS: 4.0000 mg | ORAL_TABLET | Freq: Four times a day (QID) | ORAL | Status: DC | PRN

## 2019-08-13 MED ORDER — SODIUM CHLORIDE 0.9 % IV BOLUS
1000.0000 mL | Freq: Once | INTRAVENOUS | Status: AC
  Administered 2019-08-13: 1000 mL via INTRAVENOUS

## 2019-08-13 MED ORDER — PIMAVANSERIN TARTRATE 34 MG PO CAPS
34.0000 mg | ORAL_CAPSULE | Freq: Every day | ORAL | Status: DC
  Administered 2019-08-15 – 2019-08-19 (×5): 34 mg via ORAL
  Filled 2019-08-13 (×5): qty 1

## 2019-08-13 MED ORDER — ACETAMINOPHEN 325 MG PO TABS
650.0000 mg | ORAL_TABLET | Freq: Four times a day (QID) | ORAL | Status: DC | PRN
  Administered 2019-08-18: 650 mg via ORAL
  Filled 2019-08-13: qty 2

## 2019-08-13 NOTE — ED Notes (Signed)
Foley team here to insert foley.  Supplies bedside.

## 2019-08-13 NOTE — H&P (Signed)
History and Physical    Robert [name] [id number] DOB: [birth year] DOA: 08/13/2019  PCP: [name], MD  Patient coming from: Home via EMS  I have personally briefly reviewed patient's old medical records in [place]  Chief Complaint: Weakness, difficulty with urination  HPI: Robert [name] is a 73 y.o. male with medical history significant for history of CVA with residual left-sided hemiparesis, hypertension, hyperlipidemia, parkinsonism, history of ureteral stricture, recurrent UTI, history of DVT s/p IVC filter who presents to the ED for evaluation of generalized fatigue and difficulty with urination.  Wife is at bedside and provides additional history.  Patient reports 1-2 days of urinary retention with only minimal urine output.  He reports a history of urethral stricture which was dilated approximately 5 years ago and until now he has not had difficulty with urinary output.  He has been having associated nausea without vomiting, diaphoresis, and generalized fatigue/malaise.  Wife states that he has new onset of swelling in both of his legs beginning today.  He denies any associated fevers, chills, chest pain, dyspnea, abdominal pain.  He reports chronic left-sided weakness from prior stroke and states he is able to ambulate with a assistance of a cane.  Per ED documentation, on initial EMS arrival patient's BP was in the 70s, at that time ED declined transfer to the ED.  EMS were called again and BP was reportedly in the 90s and patient was clammy and nauseated.  He was given 500 ccs of fluid on route to the ED.  ED Course:  Initial vitals in the ED showed BP 118/67, pulse 99, RR 20, temp 98.4 Fahrenheit, SPO2 98% on room air.  Orthostatic vital signs were positive by increase in heart rate from 100>144 from the lying to standing position.  Labs are notable for sodium 138, potassium 4.2, BUN 16, creatinine 1.34, serum glucose 211, WBC 9.1, hemoglobin 15.8, platelets 249,000.   High-sensitivity troponin I 156 > 127, lactic acid 2.0> 1.3.  Urinalysis showed negative nitrites, moderate leukocytes, > 50 RBC,> 50 WBCs, many bacteria on microscopy.  Blood and urine cultures were obtained and pending.  2 view chest x-ray showed normal cardiac silhouette and chronic elevation of the right hemidiaphragm, was negative for focal consolidation, edema, or effusion.  Renal ultrasound showed mild bilateral renal cortical thinning without other significant abnormality, no hydronephrosis noted.  Patient was given 1 L normal saline and started on IV ceftriaxone.  EDP discussed the case with on-call urology who recommended placement of coud catheter.  The hospitalist service was consulted to admit for further evaluation and management.  Review of Systems: All systems reviewed and are negative except as documented in history of present illness above.   Past Medical History:  Diagnosis Date  . CVA (cerebral vascular accident) ([place])    with left sided hemiparesis  . Diverticulosis   . Frequency of urination   . GERD (gastroesophageal reflux disease)   . Gross hematuria   . History of acute pyelonephritis    10-13-2012  . History of CVA with residual deficit    2008--  left side of body weakness and foot drop (wears leg brace and uses cane)  . History of DVT of lower extremity    2008--  cva  . Hypercholesteremia   . Hyperlipidemia   . Hyperlipidemia   . Hypertension   . 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 Left leg DVT ([place])   . S/P insertion of IVC (inferior  vena caval) filter    2008  . Urethral stricture   . Urgency of urination   . Weakness of left side of body    secondary to cva 2008  . Wears glasses   . Wears hearing aid    bilateral-- wears intermittantly    Past Surgical History:  Procedure Laterality Date  . CYSTOSCOPY WITH RETROGRADE URETHROGRAM N/A 10/23/2015   Procedure: CYSTOSCOPY WITH RETROGRADE URETHROGRAM;  Surgeon: [name], MD;   [facility]: [facility];  Service: Urology;  Laterality: N/A;  . CYSTOSCOPY WITH URETHRAL DILATATION N/A 10/23/2015   Procedure: CYSTOSCOPY WITH URETHRAL BALLOON DILATATION;  Surgeon: [name], MD;  Location: [facility];  Service: Urology;  Laterality: N/A;  BALLOON DILATION   . IVC FILTER PLACEMENT ([place] [facility])  2008    Social History:  reports that he quit smoking about 48 years ago. His smoking use included cigarettes. He quit after 10.00 years of use. He has never used smokeless tobacco. He reports that he does not drink alcohol or use drugs.  Allergies  Allergen Reactions  . Propofol Other (See Comments)    "hiccups for weeks"    Family History  Problem Relation Age of Onset  . Diabetes Sister   . Lung cancer Brother        Post 9/11 voluntary work in [place]  . Prostate cancer Brother   . Other Mother        passed away young- non medical  . Alzheimer's disease Father   . Healthy Son      Prior to Admission medications   Medication Sig Start Date End Date Taking? Authorizing Provider  aspirin 81 MG tablet Take 81 mg by mouth daily.    [provider]  Carbidopa-Levodopa ER (SINEMET CR) 25-100 MG tablet controlled release TAKE TWO TABLETS BY MOUTH THREE TIMES DAILY  Patient taking differently: 2 tablets 2 (two) times daily. Take 2 in AM , 1 in PM 03/07/19   Tat, Rebecca S, DO  losartan (COZAAR) 50 MG tablet Take 25 mg by mouth every morning.     [provider]  Pimavanserin Tartrate 34 MG CAPS Take by mouth.    [provider]  polyethylene glycol (MIRALAX) packet Take 17 g by mouth daily. 05/08/17   Mackuen, Courteney Lyn, MD  simvastatin (ZOCOR) 20 MG tablet Take 20 mg by mouth every evening.    [provider]    Physical Exam: Vitals:   08/13/19 1915 08/13/19 2100 08/13/19 2130 08/13/19 2230  BP: (!) 158/78 126/73 120/85   Pulse:  82  86  Resp:  17 (!) 21 (!) 23  Temp:      SpO2:  99%  96%  Weight:       Height:        Constitutional: Resting supine in bed, NAD, calm, comfortable Eyes: PERRL, lids and conjunctivae normal ENMT: Mucous membranes are moist. Posterior pharynx clear of any exudate or lesions. Neck: normal, supple, no masses. Respiratory: clear to auscultation bilaterally, no wheezing, no crackles. Normal respiratory effort. No accessory muscle use.  Cardiovascular: Regular rate and rhythm, no murmurs / rubs / gallops.  +2 bilateral lower extremity edema. 2+ pedal pulses. Abdomen: no tenderness, no masses palpated. No hepatosplenomegaly. Bowel sounds positive.  Musculoskeletal: no clubbing / cyanosis. No joint deformity upper and lower extremities.  ROM diminished left upper and lower extremities. Skin: no rashes, lesions, ulcers. No induration Neurologic: CN 2-12 grossly intact. Sensation intact, Strength 2/5 in  left upper and lower extremities, 5/5 right upper and lower extremities. Psychiatric: Normal judgment and insight. Alert and oriented x 3. Normal mood.     Labs on Admission: I have personally reviewed following labs and imaging studies  CBC: Recent Labs  Lab 08/13/19 1604  WBC 9.1  NEUTROABS 7.6  HGB 15.8  HCT 49.1  MCV 92.8  PLT 0000000   Basic Metabolic Panel: Recent Labs  Lab 08/13/19 1604  NA 138  K 4.2  CL 103  CO2 21*  GLUCOSE 211*  BUN 16  CREATININE 1.34*  CALCIUM 9.2   GFR: Estimated Creatinine Clearance: 64.1 mL/min (A) (by C-G formula based on SCr of 1.34 mg/dL (H)). Liver Function Tests: Recent Labs  Lab 08/13/19 1604  AST 25  ALT 9  ALKPHOS 69  BILITOT 1.3*  PROT 7.1  ALBUMIN 3.8   No results for input(s): LIPASE, AMYLASE in the last 168 hours. No results for input(s): AMMONIA in the last 168 hours. Coagulation Profile: No results for input(s): INR, PROTIME in the last 168 hours. Cardiac Enzymes: No results for input(s): CKTOTAL, CKMB, CKMBINDEX, TROPONINI in the last 168 hours. BNP (last 3 results) No results for  input(s): PROBNP in the last 8760 hours. HbA1C: No results for input(s): HGBA1C in the last 72 hours. CBG: No results for input(s): GLUCAP in the last 168 hours. Lipid Profile: No results for input(s): CHOL, HDL, LDLCALC, TRIG, CHOLHDL, LDLDIRECT in the last 72 hours. Thyroid Function Tests: No results for input(s): TSH, T4TOTAL, FREET4, T3FREE, THYROIDAB in the last 72 hours. Anemia Panel: No results for input(s): VITAMINB12, FOLATE, FERRITIN, TIBC, IRON, RETICCTPCT in the last 72 hours. Urine analysis:    Component Value Date/Time   COLORURINE AMBER (A) 08/13/2019 1613   APPEARANCEUR CLOUDY (A) 08/13/2019 1613   LABSPEC 1.019 08/13/2019 1613   PHURINE 5.0 08/13/2019 1613   GLUCOSEU 50 (A) 08/13/2019 1613   GLUCOSEU 100 (A) 02/06/2019 0813   HGBUR LARGE (A) 08/13/2019 1613   BILIRUBINUR NEGATIVE 08/13/2019 1613   KETONESUR 5 (A) 08/13/2019 1613   PROTEINUR 100 (A) 08/13/2019 1613   UROBILINOGEN 1.0 02/06/2019 0813   NITRITE NEGATIVE 08/13/2019 1613   LEUKOCYTESUR MODERATE (A) 08/13/2019 1613    Radiological Exams on Admission: Dg Chest 2 View  Result Date: 08/13/2019 CLINICAL DATA:  Acute weakness. EXAM: CHEST - 2 VIEW COMPARISON:  08/07/2019 FINDINGS: The cardiomediastinal silhouette is unremarkable. Elevation of the RIGHT hemidiaphragm again noted. There is no evidence of focal airspace disease, pulmonary edema, suspicious pulmonary nodule/mass, pleural effusion, or pneumothorax. No acute bony abnormalities are identified. IMPRESSION: 1. No active cardiopulmonary disease. Electronically Signed   By: Margarette Canada M.D.   On: 08/13/2019 15:37   US Renal  Result Date: 08/13/2019 CLINICAL DATA:  73 year old male with urinary retention and increased creatinine. EXAM: RENAL / URINARY TRACT ULTRASOUND COMPLETE COMPARISON:  05/08/2017 CT FINDINGS: Right Kidney: Renal measurements: 12.3 x 5.4 x 5.9 cm = volume: 202 mL. Mild cortical thinning noted. Echogenicity within normal limits. No  mass or hydronephrosis visualized. Left Kidney: Renal measurements: 12 x 5.1 x 5.9 cm = volume: 187 mL. Mild cortical thinning noted. Echogenicity within normal limits. No mass or hydronephrosis visualized. Bladder: Appears normal for degree of bladder distention. Other: None IMPRESSION: Mild bilateral renal cortical thinning without other significant abnormality. No hydronephrosis. Electronically Signed   By: Margarette Canada M.D.   On: 08/13/2019 21:07    EKG: Independently reviewed. Sinus tachycardia with motion artifact, rate is faster when  compared to prior.  Assessment/Plan Principal Problem:   Orthostatic hypotension Active Problems:   HTN (hypertension)   Acute lower UTI   Hemiparesis affecting left side as late effect of cerebrovascular accident [facility])   Hyperlipidemia  Robert [name] is a 73 y.o. male with medical history significant for history of CVA with residual left-sided hemiparesis, hypertension, hyperlipidemia, parkinsonism, history of ureteral stricture, recurrent UTI, history of DVT s/p IVC filter who is admitted with orthostatic hypotension and UTI with urinary retention.   Orthostatic hypotension with near syncope: The setting of UTI and losartan use.  BP improved with initial fluids, remained orthostatic by pulse parameters at time of admission. -Will continue gentle IV fluids overnight -Monitor on telemetry -Hold home losartan  Urinary retention with history of ureteral stricture: Reported ureteral dilation approximately 5 years ago.  EDP discussed with urology who are recommending coude catheter placement and will see tomorrow.  Renal ultrasound with mild bilateral renal cortical thinning, no hydronephrosis. -Catheter team consulted for coude catheter insertion -If unable to place coude catheter will need to notify urology -Monitor strict I/O's, bladder scans as needed  UTI: -Continue IV ceftriaxone -Follow urine cultures  Mild AKI: In setting of urinary retention  and hypotension. -Gentle fluids as above -Holding losartan  Elevated troponin: Mildly elevated at 156 with repeat downtrending to 127.  Likely demand ischemia from above.  Lower extremity edema: Wife states that bilateral lower extremity edema is new over the last 24 hours.  Suspect dependent edema however with history of DVT, tachycardia, and mild increased troponin will obtain lower extremity Dopplers to assess for DVT.  If negative would recommend leg elevation and TED hose.  History of CVA with residual left-sided hemiparesis: Chronic and stable. -Continue aspirin 81 mg daily and simvastatin  Hypertension: Holding losartan as above in setting of orthostatic hypotension.  Hyperlipidemia: Continue simvastatin  Parkinsonism: Continue Sinemet and Nuplazid.  DVT prophylaxis: Lovenox Code Status: Full code, confirmed with patient Family Communication: Discussed with wife at bedside Disposition Plan: Pending clinical progress Consults called: Urology consulted by EDP Admission status: Observation   Zada Finders MD [facility]  If 7PM-7AM, please contact night-coverage [email or web address]  08/13/2019, 11:15 PM

## 2019-08-13 NOTE — ED Notes (Signed)
Informed provider of trop

## 2019-08-13 NOTE — ED Provider Notes (Signed)
Aurelius Holl is a 73 y.o. male, with a history of CVA, urethral stricture, hyperlipidemia, HTN, presenting to the ED with generalized weakness and lightheadedness noted this morning initially around 9 PM.  He also notes difficulty with urination.  Patient's wife states patient's systolic blood pressure was in the 70s.  EMS was called, but patient initially refused transport. Around 11 AM, patient felt clammy and lightheaded while at rest.  He allowed transport via EMS at that time.  Patient received IV fluids with EMS and blood pressure improved. Urologist: Louis Meckel He denies fever/chills, abdominal pain, hematuria, vomiting, diarrhea, chest pain, shortness of breath, syncope, neurologic deficits, or any other complaints.   HPI from Madilyn Hook, PA-C: "Patient is a 73 year old gentleman with past medical history of CVA, GERD, hypertension, hyperlipidemia, urethral stricture who presents the emergency department for generalized fatigue and malaise which started this morning.  Patient's wife states that this is happened in the past and he has had urinary tract infections when it happens.  Patient reports some slight nausea and just overall feeling unwell.  Reports he has urinary urgency without hematuria, flank pain, fever, chills, vomiting.  Patient's wife reports that he was mildly hypotensive this morning prior to EMS arrival with a systolic pressure of 99.  Reports that EMS did give him some fluids."  Past Medical History:  Diagnosis Date  . CVA (cerebral vascular accident) ([place])    with left sided hemiparesis  . Diverticulosis   . Frequency of urination   . GERD (gastroesophageal reflux disease)   . Gross hematuria   . History of acute pyelonephritis    10-13-2012  . History of CVA with residual deficit    2008--  left side of body weakness and foot drop (wears leg brace and uses cane)  . History of DVT of lower extremity    2008--  cva  . Hypercholesteremia   . Hyperlipidemia   .  Hyperlipidemia   . Hypertension   . 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 Left leg DVT ([place])   . 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 Urethral stricture   . Urgency of urination   . Weakness of left side of body    secondary to cva 2008  . Wears glasses   . Wears hearing aid    bilateral-- wears intermittantly    Physical Exam  BP 128/67   Pulse 88   Temp 98.4 F (36.9 C)   Resp 18   Ht 6\' 2"  (1.88 m)   Wt 104.3 kg   SpO2 91%   BMI 29.53 kg/m   Physical Exam Vitals signs and nursing note reviewed.  Constitutional:      General: He is not in acute distress.    Appearance: He is well-developed. He is not diaphoretic.  HENT:     Head: Normocephalic and atraumatic.     Mouth/Throat:     Mouth: Mucous membranes are moist.     Pharynx: Oropharynx is clear.  Eyes:     Conjunctiva/sclera: Conjunctivae normal.  Neck:     Musculoskeletal: Neck supple.  Cardiovascular:     Rate and Rhythm: Normal rate and regular rhythm.     Pulses: Normal pulses.          Radial pulses are 2+ on the right side and 2+ on the left side.       Posterior tibial pulses are 2+ on the right side and  2+ on the left side.     Heart sounds: Normal heart sounds.     Comments: Tactile temperature in the extremities appropriate and equal bilaterally. Pulmonary:     Effort: Pulmonary effort is normal. No respiratory distress.     Breath sounds: Normal breath sounds.  Abdominal:     Palpations: Abdomen is soft.     Tenderness: There is no abdominal tenderness. There is no guarding.  Musculoskeletal:     Right lower leg: No edema.     Left lower leg: No edema.  Lymphadenopathy:     Cervical: No cervical adenopathy.  Skin:    General: Skin is warm and dry.  Neurological:     Mental Status: He is alert.  Psychiatric:        Mood and Affect: Mood and affect normal.        Speech: Speech normal.        Behavior: Behavior normal.     ED Course/Procedures      Procedures   Abnormal Labs Reviewed  CBC WITH DIFFERENTIAL/PLATELET - Abnormal; Notable for the following components:      Result Value   Lymphs Abs 0.6 (*)    All other components within normal limits  COMPREHENSIVE METABOLIC PANEL - Abnormal; Notable for the following components:   CO2 21 (*)    Glucose, Bld 211 (*)    Creatinine, Ser 1.34 (*)    Total Bilirubin 1.3 (*)    GFR calc non Af Amer 53 (*)    All other components within normal limits  URINALYSIS, ROUTINE W REFLEX MICROSCOPIC - Abnormal; Notable for the following components:   Color, Urine AMBER (*)    APPearance CLOUDY (*)    Glucose, UA 50 (*)    Hgb urine dipstick LARGE (*)    Ketones, ur 5 (*)    Protein, ur 100 (*)    Leukocytes,Ua MODERATE (*)    RBC / HPF >50 (*)    WBC, UA >50 (*)    Bacteria, UA MANY (*)    All other components within normal limits    Dg Chest 2 View  Result Date: 08/13/2019 CLINICAL DATA:  Acute weakness. EXAM: CHEST - 2 VIEW COMPARISON:  08/07/2019 FINDINGS: The cardiomediastinal silhouette is unremarkable. Elevation of the RIGHT hemidiaphragm again noted. There is no evidence of focal airspace disease, pulmonary edema, suspicious pulmonary nodule/mass, pleural effusion, or pneumothorax. No acute bony abnormalities are identified. IMPRESSION: 1. No active cardiopulmonary disease. Electronically Signed   By: Margarette Canada M.D.   On: 08/13/2019 15:37    EKG Interpretation  Date/Time:  Sunday August 13 2019 13:50:13 EDT Ventricular Rate:  104 PR Interval:    QRS Duration: 100 QT Interval:  341 QTC Calculation: 449 R Axis:   9 Text Interpretation:  Sinus tachycardia Probable left atrial enlargement No STEMI  Confirmed by Octaviano Glow [phone]) on 08/13/2019 2:03:26 PM      MDM   Clinical Course as of Aug 13 32  Sun Aug 13, 8951  [id number] 73 year old presenting via EMS for weakness, urinary urgency and dark-colored urine.  History of UTI in the past.  Sees urology.   Hypotensive with EMS but given fluids and improved here to 108/67.  Plan to work-up for weakness/ possible UTI and treat accordingly. Afebrile. He is not having chest pain or shortness of breath.   [KM]  1628 Patient was seen by myself as well as PA provider.  Briefly this is a 73 year old gentleman with a  history of urethral stricture and recurrent UTIs, presented to the emergency department with lightheadedness and hypotension.  Patient's wife reports that he woke up in usual state of health today.  He was attempting to use the bathroom earlier today he began to feel weak.  Patient states he was attempted to urinate.  He does have a difficulty urinating.  He became ashen and lightheaded and sat down, with no loss of consciousness.  When EMS arrived the patient reportedly had a pressure of "70/40" and got a small IV fluid bolus, with improvement of his pressure on arrival.  Patient is also feeling better upon arrival.  He states his urine has been dark for the past several days.  He denies any fevers or chills.  He denies any history of syncope.  He states he gets often recurrent UTIs.   [MT]  [id number] On physical exam the patient is afebrile.  His heart rate has been in the high 90s.  His blood pressure stable.  He appears comfortable.  He has a history of Parkinson's has a parkinsonian tremor.  He has no abdominal tenderness.  He has mild pitting edema of the lower extremities.  The plan is to order labs including CBC, troponin, CMP as well as EKG.  We will check a UA for suspected UTI.  We will also give the patient a liter of fluids, suspect this may be related to orthostasis or possible vasovagal episode given that he was urinating.  If his work-up is unremarkable, I believe it is reasonable to discharge the patient home.   [MT]  [id number] This note was dictated using dragon dictation software.  Please be aware that there may be minor translation errors as a result of this oral dictation   [MT]  1716 Pulse 88  on monitor.  Pulse Rate(!): 103 [SJ]  1921 Spoke with Dr. Alyson Ingles, urologist.  Can follow up in office, if he doesn't get admitted.    [SJ]  1923 Patient does not have any chest pain, shortness of breath, dizziness, or other additional symptoms.   Troponin I (High Sensitivity)(!!): 156 [SJ]  2103 Updated Dr. Alyson Ingles on difficulties with placing foley catheter. Agrees with coude catheter. States we can contact the catheter team for insertion. If they have trouble as well, contact him back.   [SJ]  2229 Patient notes some lower extremity edema.  Denies shortness of breath, cough, chest pain.  Lungs are clear.  No increased work of breathing.  No orthopnea.   [SJ]  2310 Spoke with Dr. Posey Pronto, hospitalist. Agrees to admit the patient.    [SJ]    Clinical Course User Index [KM] Alveria Apley, PA-C [MT] Langston Masker Carola Rhine, MD [SJ] Lorayne Bender, PA-C    Patient care handoff report received from [facility], PA-C. Plan: Labs are pending.  Review patient status and results.   Patient presents after feeling weak and lightheaded.  He has had difficulty urinating. Patient is nontoxic appearing, afebrile, not tachypneic, not hypotensive here in the ED, maintains excellent SPO2 on room air, and is in no apparent distress.  He experienced some mild episodes of tachycardia that improved with IV fluids.  No leukocytosis. Positive orthostatic changes by pulse rate.  Patient voices overall improvement following IV fluids. UA shows evidence of UTI. Post void residual reveals 348 cc urine. Due to the urinary retention, combined with UTI, the initial hypotension, and the patient's age, we thought it prudent to admit the patient Difficulty encountered placing Foley catheter.  Catheter team had to be utilized to place a coud catheter.  This process took quite a while. Additionally, patient was initially quite adamant about refusing admission.  We laid out our concerns and reasoning for admission to the  patient.  After some consideration, and speaking with his wife in private, patient eventually agreed to allow admission. He was noted to have a mildly elevated troponin.  I suspect this may have elevated due to increased demand placed on the heart while patient was hypotensive.  This appears to be downtrending.  Findings and plan of care discussed with Julianne Rice, MD.   Vitals:   08/13/19 1415 08/13/19 1430 08/13/19 1445 08/13/19 1500  BP: 108/67 110/67 121/72 128/67  Pulse: 94 (!) 104 89 88  Resp: (!) 22 20 19 18   Temp:      SpO2: 92% 95% 95% 91%  Weight:      Height:       Orthostatic VS for the past 24 hrs:  BP- Lying Pulse- Lying BP- Sitting Pulse- Sitting BP- Standing at 0 minutes Pulse- Standing at 0 minutes  08/13/19 1554 124/79 100 114/72 130 118/75 144       Lorayne Bender, PA-C 08/14/19 0045    Julianne Rice, MD 08/15/19 2227

## 2019-08-13 NOTE — ED Notes (Signed)
Two RN's were unsuccessful at inserting foley.  Provider aware.

## 2019-08-13 NOTE — ED Provider Notes (Signed)
[facility] EMERGENCY DEPARTMENT Provider Note   CSN: [id number] Arrival date & time: 08/13/19  1319     History   Chief Complaint Chief Complaint  Patient presents with  . Hypotension  . Dysuria    HPI Robert [name] is a 73 y.o. male.     Patient is a 73 year old gentleman with past medical history of CVA, GERD, hypertension, hyperlipidemia, urethral stricture who presents the emergency department for generalized fatigue and malaise which started this morning.  Patient's wife states that this is happened in the past and he has had urinary tract infections when it happens.  Patient reports some slight nausea and just overall feeling unwell.  Reports he has urinary urgency without hematuria, flank pain, fever, chills, vomiting.  Patient's wife reports that he was mildly hypotensive this morning prior to EMS arrival with a systolic pressure of 99.  Reports that EMS did give him some fluids.     Past Medical History:  Diagnosis Date  . 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 Diverticulosis   . Frequency of urination   . GERD (gastroesophageal reflux disease)   . Gross hematuria   . History of acute pyelonephritis    10-13-2012  . History of CVA with residual deficit    2008--  left side of body weakness and foot drop (wears leg brace and uses cane)  . History of DVT of lower extremity    2008--  cva  . Hypercholesteremia   . Hyperlipidemia   . Hyperlipidemia   . Hypertension   . 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 Left leg DVT ([place])   . 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 Urethral stricture   . Urgency of urination   . Weakness of left side of body    secondary to cva 2008  . Wears glasses   . Wears hearing aid    bilateral-- wears intermittantly    Patient Active Problem List   Diagnosis Date Noted  . Hemiparesis affecting left side as late effect of cerebrovascular accident  ([place]) 12/06/2017  . Gait abnormality 11/22/2017  . UTI (urinary tract infection) 04/22/2017  . Acute lower UTI 04/22/2017  . Tremor 04/22/2017  . Acute pyelonephritis 09/29/2012  . Nausea 09/29/2012  . Fever 09/29/2012  . HTN (hypertension) 09/29/2012  . H/O: CVA (cerebrovascular accident) 09/29/2012  . Hearing loss 09/29/2012    Past Surgical History:  Procedure Laterality Date  . CYSTOSCOPY WITH RETROGRADE URETHROGRAM N/A 10/23/2015   Procedure: CYSTOSCOPY WITH RETROGRADE URETHROGRAM;  Surgeon: [name], MD;  [facility];  Service: Urology;  Laterality: N/A;  . CYSTOSCOPY WITH URETHRAL DILATATION N/A 10/23/2015   Procedure: CYSTOSCOPY WITH URETHRAL BALLOON DILATATION;  Surgeon: [name], MD;  [facility];  Service: Urology;  Laterality: N/A;  BALLOON DILATION   . IVC FILTER PLACEMENT (Section [facility])  2008        Home Medications    Prior to Admission medications   Medication Sig Start Date End Date Taking? Authorizing Provider  aspirin 81 MG tablet Take 81 mg by mouth daily.    [provider]  Carbidopa-Levodopa ER (SINEMET CR) 25-100 MG tablet controlled release TAKE TWO TABLETS BY MOUTH THREE TIMES DAILY  Patient taking differently: 2 tablets 2 (two) times daily. Take 2 in AM , 1 in PM 03/07/19   Tat, Wells Guiles  S, DO  losartan (COZAAR) 50 MG tablet Take 25 mg by mouth every morning.     [provider]  Pimavanserin Tartrate 34 MG CAPS Take by mouth.    [provider]  polyethylene glycol (MIRALAX) packet Take 17 g by mouth daily. 05/08/17   Mackuen, Courteney Lyn, MD  simvastatin (ZOCOR) 20 MG tablet Take 20 mg by mouth every evening.    [provider]    Family History Family History  Problem Relation Age of Onset  . Diabetes Sister   . Lung cancer Brother        Post 9/11 voluntary work in [place]  . Prostate cancer Brother   . Other Mother        passed away young- non medical  .  Alzheimer's disease Father   . Healthy Son     Social History Social History   Tobacco Use  . Smoking status: Former Smoker    Years: 10.00    Types: Cigarettes    Quit date: 10/26/1970    Years since quitting: 48.8  . Smokeless tobacco: Never Used  Substance Use Topics  . Alcohol use: No  . Drug use: No     Allergies   Propofol   Review of Systems Review of Systems  Constitutional: Positive for fatigue. Negative for appetite change and diaphoresis.  Respiratory: Negative for cough and shortness of breath.   Cardiovascular: Negative for chest pain.  Gastrointestinal: Positive for nausea. Negative for abdominal pain, anal bleeding, constipation, diarrhea and vomiting.  Genitourinary: Negative for dysuria.  Musculoskeletal: Negative for back pain.  Skin: Negative for rash.  Neurological: Positive for light-headedness. Negative for dizziness and headaches.  Psychiatric/Behavioral: Negative for confusion.     Physical Exam Updated Vital Signs BP 128/67   Pulse 88   Temp 98.4 F (36.9 C)   Resp 18   Ht 6\' 2"  (1.88 m)   Wt 104.3 kg   SpO2 91%   BMI 29.53 kg/m   Physical Exam Vitals signs and nursing note reviewed.  Constitutional:      General: He is not in acute distress.    Appearance: Normal appearance. He is not ill-appearing, toxic-appearing or diaphoretic.  HENT:     Head: Normocephalic.     Mouth/Throat:     Mouth: Mucous membranes are moist.  Eyes:     Conjunctiva/sclera: Conjunctivae normal.  Cardiovascular:     Rate and Rhythm: Normal rate and regular rhythm.  Pulmonary:     Effort: Pulmonary effort is normal.     Breath sounds: Normal breath sounds.  Abdominal:     General: Abdomen is flat. There is no distension.     Tenderness: There is no abdominal tenderness. There is no right CVA tenderness, left CVA tenderness, guarding or rebound.  Skin:    General: Skin is warm and dry.     Capillary Refill: Capillary refill takes less than 2 seconds.   Neurological:     General: No focal deficit present.     Mental Status: He is alert and oriented to person, place, and time.  Psychiatric:        Mood and Affect: Mood normal.      ED Treatments / Results  Labs (all labs ordered are listed, but only abnormal results are displayed) Labs Reviewed  URINE CULTURE  CBC WITH DIFFERENTIAL/PLATELET  COMPREHENSIVE METABOLIC PANEL  URINALYSIS, ROUTINE W REFLEX MICROSCOPIC  TROPONIN I (HIGH SENSITIVITY)    EKG EKG Interpretation  Date/Time:  Sunday  August 13 2019 13:50:13 EDT Ventricular Rate:  104 PR Interval:    QRS Duration: 100 QT Interval:  341 QTC Calculation: 449 R Axis:   9 Text Interpretation:  Sinus tachycardia Probable left atrial enlargement No STEMI  Confirmed by Octaviano Glow [phone]) on 08/13/2019 2:03:26 PM   Radiology Dg Chest 2 View  Result Date: 08/13/2019 CLINICAL DATA:  Acute weakness. EXAM: CHEST - 2 VIEW COMPARISON:  08/07/2019 FINDINGS: The cardiomediastinal silhouette is unremarkable. Elevation of the RIGHT hemidiaphragm again noted. There is no evidence of focal airspace disease, pulmonary edema, suspicious pulmonary nodule/mass, pleural effusion, or pneumothorax. No acute bony abnormalities are identified. IMPRESSION: 1. No active cardiopulmonary disease. Electronically Signed   By: Margarette Canada M.D.   On: 08/13/2019 15:37    Procedures Procedures (including critical care time)  Medications Ordered in ED Medications - No data to display   Initial Impression / Assessment and Plan / ED Course  I have reviewed the triage vital signs and the nursing notes.  Pertinent labs & imaging results that were available during my care of the patient were reviewed by me and considered in my medical decision making (see chart for details).  Clinical Course as of Aug 12 1540  Sun Aug 12, 1121  3734 73 year old presenting via EMS for weakness, urinary urgency and dark-colored urine.  History of UTI in the past.   Sees urology.  Hypotensive with EMS but given fluids and improved here to 108/67.  Plan to work-up for weakness/ possible UTI and treat accordingly. Afebrile. He is not having chest pain or shortness of breath.   [KM]    Clinical Course User Index [KM] Alveria Apley, PA-C       Patient care signed out to [facility] due to change of shift  Final Clinical Impressions(s) / ED Diagnoses   Final diagnoses:  None    ED Discharge Orders    None       Kristine Royal 08/13/19 1541    Wyvonnia Dusky, MD 08/16/19 2042

## 2019-08-13 NOTE — ED Triage Notes (Addendum)
Ems called very early this am for pt feeling weak. pts bp in the 70s but refused to come to ERE , then EMS called again for same , BP this time in the 90s , pt also states that his urine is dark , pt having freq urination and  Per ems pt is clammy, he is nauseated, pt has hx of previous stokr left side with residual paralysis left whoile side  Pt has IV 22 in rt hand got 500 fluid now bp is better

## 2019-08-13 NOTE — ED Notes (Signed)
Pt back from US

## 2019-08-14 ENCOUNTER — Encounter (HOSPITAL_COMMUNITY): Payer: Self-pay | Admitting: Internal Medicine

## 2019-08-14 DIAGNOSIS — R600 Localized edema: Secondary | ICD-10-CM | POA: Diagnosis present

## 2019-08-14 DIAGNOSIS — N390 Urinary tract infection, site not specified: Secondary | ICD-10-CM | POA: Diagnosis not present

## 2019-08-14 DIAGNOSIS — I159 Secondary hypertension, unspecified: Secondary | ICD-10-CM | POA: Diagnosis not present

## 2019-08-14 DIAGNOSIS — M21372 Foot drop, left foot: Secondary | ICD-10-CM | POA: Diagnosis present

## 2019-08-14 DIAGNOSIS — A419 Sepsis, unspecified organism: Secondary | ICD-10-CM | POA: Diagnosis not present

## 2019-08-14 DIAGNOSIS — N3001 Acute cystitis with hematuria: Secondary | ICD-10-CM | POA: Diagnosis not present

## 2019-08-14 DIAGNOSIS — I82412 Acute embolism and thrombosis of left femoral vein: Secondary | ICD-10-CM | POA: Diagnosis not present

## 2019-08-14 DIAGNOSIS — K5790 Diverticulosis of intestine, part unspecified, without perforation or abscess without bleeding: Secondary | ICD-10-CM | POA: Diagnosis not present

## 2019-08-14 DIAGNOSIS — R338 Other retention of urine: Secondary | ICD-10-CM | POA: Diagnosis not present

## 2019-08-14 DIAGNOSIS — R6520 Severe sepsis without septic shock: Secondary | ICD-10-CM | POA: Diagnosis present

## 2019-08-14 DIAGNOSIS — Z7401 Bed confinement status: Secondary | ICD-10-CM | POA: Diagnosis not present

## 2019-08-14 DIAGNOSIS — I951 Orthostatic hypotension: Secondary | ICD-10-CM | POA: Diagnosis not present

## 2019-08-14 DIAGNOSIS — R319 Hematuria, unspecified: Secondary | ICD-10-CM | POA: Diagnosis present

## 2019-08-14 DIAGNOSIS — I69354 Hemiplegia and hemiparesis following cerebral infarction affecting left non-dominant side: Secondary | ICD-10-CM | POA: Diagnosis not present

## 2019-08-14 DIAGNOSIS — R278 Other lack of coordination: Secondary | ICD-10-CM | POA: Diagnosis not present

## 2019-08-14 DIAGNOSIS — R0902 Hypoxemia: Secondary | ICD-10-CM | POA: Diagnosis not present

## 2019-08-14 DIAGNOSIS — A409 Streptococcal sepsis, unspecified: Secondary | ICD-10-CM | POA: Diagnosis present

## 2019-08-14 DIAGNOSIS — I6930 Unspecified sequelae of cerebral infarction: Secondary | ICD-10-CM | POA: Insufficient documentation

## 2019-08-14 DIAGNOSIS — Z87440 Personal history of urinary (tract) infections: Secondary | ICD-10-CM | POA: Diagnosis not present

## 2019-08-14 DIAGNOSIS — N400 Enlarged prostate without lower urinary tract symptoms: Secondary | ICD-10-CM | POA: Diagnosis present

## 2019-08-14 DIAGNOSIS — N179 Acute kidney failure, unspecified: Secondary | ICD-10-CM | POA: Diagnosis present

## 2019-08-14 DIAGNOSIS — M2550 Pain in unspecified joint: Secondary | ICD-10-CM | POA: Diagnosis not present

## 2019-08-14 DIAGNOSIS — R339 Retention of urine, unspecified: Secondary | ICD-10-CM | POA: Diagnosis present

## 2019-08-14 DIAGNOSIS — Z7982 Long term (current) use of aspirin: Secondary | ICD-10-CM | POA: Diagnosis not present

## 2019-08-14 DIAGNOSIS — E785 Hyperlipidemia, unspecified: Secondary | ICD-10-CM | POA: Diagnosis present

## 2019-08-14 DIAGNOSIS — G839 Paralytic syndrome, unspecified: Secondary | ICD-10-CM | POA: Diagnosis not present

## 2019-08-14 DIAGNOSIS — K5900 Constipation, unspecified: Secondary | ICD-10-CM | POA: Diagnosis not present

## 2019-08-14 DIAGNOSIS — N401 Enlarged prostate with lower urinary tract symptoms: Secondary | ICD-10-CM | POA: Diagnosis not present

## 2019-08-14 DIAGNOSIS — Z87891 Personal history of nicotine dependence: Secondary | ICD-10-CM | POA: Diagnosis not present

## 2019-08-14 DIAGNOSIS — K219 Gastro-esophageal reflux disease without esophagitis: Secondary | ICD-10-CM | POA: Diagnosis not present

## 2019-08-14 DIAGNOSIS — N1330 Unspecified hydronephrosis: Secondary | ICD-10-CM | POA: Diagnosis not present

## 2019-08-14 DIAGNOSIS — Z20828 Contact with and (suspected) exposure to other viral communicable diseases: Secondary | ICD-10-CM | POA: Diagnosis present

## 2019-08-14 DIAGNOSIS — R609 Edema, unspecified: Secondary | ICD-10-CM | POA: Diagnosis not present

## 2019-08-14 DIAGNOSIS — I10 Essential (primary) hypertension: Secondary | ICD-10-CM | POA: Diagnosis present

## 2019-08-14 DIAGNOSIS — G9341 Metabolic encephalopathy: Secondary | ICD-10-CM | POA: Diagnosis present

## 2019-08-14 DIAGNOSIS — I69819 Unspecified symptoms and signs involving cognitive functions following other cerebrovascular disease: Secondary | ICD-10-CM | POA: Diagnosis not present

## 2019-08-14 DIAGNOSIS — M6281 Muscle weakness (generalized): Secondary | ICD-10-CM | POA: Diagnosis not present

## 2019-08-14 DIAGNOSIS — N138 Other obstructive and reflux uropathy: Secondary | ICD-10-CM | POA: Diagnosis present

## 2019-08-14 DIAGNOSIS — N35819 Other urethral stricture, male, unspecified site: Secondary | ICD-10-CM | POA: Diagnosis not present

## 2019-08-14 DIAGNOSIS — G459 Transient cerebral ischemic attack, unspecified: Secondary | ICD-10-CM | POA: Diagnosis not present

## 2019-08-14 DIAGNOSIS — R2681 Unsteadiness on feet: Secondary | ICD-10-CM | POA: Diagnosis not present

## 2019-08-14 DIAGNOSIS — H9190 Unspecified hearing loss, unspecified ear: Secondary | ICD-10-CM | POA: Diagnosis present

## 2019-08-14 DIAGNOSIS — Z86718 Personal history of other venous thrombosis and embolism: Secondary | ICD-10-CM | POA: Diagnosis not present

## 2019-08-14 DIAGNOSIS — R41841 Cognitive communication deficit: Secondary | ICD-10-CM | POA: Diagnosis not present

## 2019-08-14 DIAGNOSIS — Z7901 Long term (current) use of anticoagulants: Secondary | ICD-10-CM | POA: Diagnosis not present

## 2019-08-14 DIAGNOSIS — E872 Acidosis: Secondary | ICD-10-CM | POA: Diagnosis present

## 2019-08-14 DIAGNOSIS — G20 Parkinson's disease: Secondary | ICD-10-CM | POA: Diagnosis present

## 2019-08-14 DIAGNOSIS — Z95828 Presence of other vascular implants and grafts: Secondary | ICD-10-CM | POA: Diagnosis not present

## 2019-08-14 DIAGNOSIS — I69391 Dysphagia following cerebral infarction: Secondary | ICD-10-CM | POA: Diagnosis not present

## 2019-08-14 LAB — BASIC METABOLIC PANEL
Anion gap: 10 (ref 5–15)
BUN: 14 mg/dL (ref 8–23)
CO2: 22 mmol/L (ref 22–32)
Calcium: 8.8 mg/dL — ABNORMAL LOW (ref 8.9–10.3)
Chloride: 107 mmol/L (ref 98–111)
Creatinine, Ser: 0.96 mg/dL (ref 0.61–1.24)
GFR calc Af Amer: >60 mL/min (ref >60)
GFR calc non Af Amer: >60 mL/min (ref >60)
Glucose, Bld: 131 mg/dL — ABNORMAL HIGH (ref 70–99)
Potassium: 3.8 mmol/L (ref 3.5–5.1)
Sodium: 139 mmol/L (ref 135–145)

## 2019-08-14 LAB — CBC
HCT: 46.6 % (ref 39.0–52.0)
Hemoglobin: 15 g/dL (ref 13.0–17.0)
MCH: 29.4 pg (ref 26.0–34.0)
MCHC: 32.2 g/dL (ref 30.0–36.0)
MCV: 91.4 fL (ref 80.0–100.0)
Platelets: 217 10*3/uL (ref 150–400)
RBC: 5.1 MIL/uL (ref 4.22–5.81)
RDW: 13.8 % (ref 11.5–15.5)
WBC: 9.6 10*3/uL (ref 4.0–10.5)
nRBC: 0 % (ref 0.0–0.2)

## 2019-08-14 LAB — CBG MONITORING, ED: Glucose-Capillary: 132 mg/dL — ABNORMAL HIGH (ref 70–99)

## 2019-08-14 LAB — SARS CORONAVIRUS 2 (TAT 6-24 HRS): SARS Coronavirus 2: NEGATIVE

## 2019-08-14 MED ORDER — CARBIDOPA-LEVODOPA ER 50-200 MG PO TBCR
1.0000 | EXTENDED_RELEASE_TABLET | Freq: Two times a day (BID) | ORAL | Status: DC
  Administered 2019-08-15 – 2019-08-19 (×10): 1 via ORAL
  Filled 2019-08-14 (×12): qty 1

## 2019-08-14 MED ORDER — CHLORHEXIDINE GLUCONATE CLOTH 2 % EX PADS
6.0000 | MEDICATED_PAD | Freq: Every day | CUTANEOUS | Status: DC
  Administered 2019-08-14 – 2019-08-19 (×5): 6 via TOPICAL

## 2019-08-14 MED ORDER — SODIUM CHLORIDE 0.9 % IV SOLN
1.0000 g | INTRAVENOUS | Status: DC
  Administered 2019-08-14 – 2019-08-19 (×6): 1 g via INTRAVENOUS
  Filled 2019-08-14 (×4): qty 1
  Filled 2019-08-14: qty 10
  Filled 2019-08-14 (×2): qty 1

## 2019-08-14 MED ORDER — CARBIDOPA-LEVODOPA ER 25-100 MG PO TBCR
1.0000 | EXTENDED_RELEASE_TABLET | Freq: Every day | ORAL | Status: DC
  Administered 2019-08-14 – 2019-08-18 (×5): 1 via ORAL
  Filled 2019-08-14 (×6): qty 1

## 2019-08-14 MED ORDER — SODIUM CHLORIDE 0.9 % IV SOLN: 1.0000 g | INTRAVENOUS | Status: DC

## 2019-08-14 NOTE — Progress Notes (Signed)
[facility] PROGRESS NOTE  Robert [name] [id number] DOB: 1946-05-01 DOA: 08/13/2019 PCP: [name], MD  Assessment/Plan:  Orthostatic hypotension with near syncope: likely related to  UTI and losartan use.  BP this am much improved. Was orthostatic by pulse parameters at time of admission. Received IV fluids  -repeat orthostatics -Monitor on telemetry -Hold home losartan  Urinary retention with history of ureteral stricture: Reported ureteral dilation approximately 5 years ago.  Renal ultrasound with mild bilateral renal cortical thinning, no hydronephrosis. Urology inserted foley with recommendation to keep 7-10 days and OP follow up.  -Monitor strict I/O's -OP follow up  UTI: he is afebrile and non-toxic appearing.  -Continue IV ceftriaxone -Follow urine cultures  Mild AKI: mild. Resolved this am. Likely related to  urinary retention and hypotension. -Gentle fluids as above -Holding losartan  Elevated troponin: Mildly elevated at 156 with repeat downtrending to 127.  Likely demand ischemia from above. No chest pain. No events on tele.  -monitor  Lower extremity edema: Wife stated that bilateral lower extremity edema is new over the last 24 hours.  Suspect dependent edema however with history of DVT, tachycardia, and mild increased troponin will obtain lower extremity Dopplers to assess for DVT.  -follow doppler results - If negative would recommend leg elevation and TED hose.  History of CVA with residual left-sided hemiparesis: in setting of parkinsons. Worse over last few days.  Chronic and stable. -Continue aspirin 81 mg daily and simvastatin -physical therapy  Hypertension: see above Holding losartan as above in setting of orthostatic hypotension.  Hyperlipidemia: Continue simvastatin  Parkinsonism: Continue Sinemet and Nuplazid.   Code Status: full Family Communication:  Disposition Plan: home. May benefit short term snf await PT  recs   Consultants:  [facility]  Procedures:    Antibiotics:  Rocephin 10/18>>  HPI/Subjective: Awake alert oriented to self and place. Denies pain.   Objective: Vitals:   08/14/19 0700 08/14/19 0856  BP: (!) 145/90   Pulse: 97 (!) 102  Resp: 20 20  Temp:    SpO2: 96% 100%    Intake/Output Summary (Last 24 hours) at 08/14/2019 1132 Last data filed at 08/14/2019 0703 Gross per 24 hour  Intake 1100 ml  Output 600 ml  Net 500 ml   Filed Weights   08/13/19 1402  Weight: 104.3 kg    Exam:   General:  Awake alert oriented to self and place. No acute distress  Cardiovascular: rrr no mgr trace LE edema no eryethema  Respiratory: normal effort BS clear bilaterally no wheeze  Abdomen: non-distended non-tender +BS no guarding or rebounding  Musculoskeletal: joints without swelling/erythema   Data Reviewed: Basic Metabolic Panel: Recent Labs  Lab 08/13/19 1604 08/14/19 0406  NA 138 139  K 4.2 3.8  CL 103 107  CO2 21* 22  GLUCOSE 211* 131*  BUN 16 14  CREATININE 1.34* 0.96  CALCIUM 9.2 8.8*   Liver Function Tests: Recent Labs  Lab 08/13/19 1604  AST 25  ALT 9  ALKPHOS 69  BILITOT 1.3*  PROT 7.1  ALBUMIN 3.8   No results for input(s): LIPASE, AMYLASE in the last 168 hours. No results for input(s): AMMONIA in the last 168 hours. CBC: Recent Labs  Lab 08/13/19 1604 08/14/19 0406  WBC 9.1 9.6  NEUTROABS 7.6  --   HGB 15.8 15.0  HCT 49.1 46.6  MCV 92.8 91.4  PLT 249 217   Cardiac Enzymes: No results for input(s): CKTOTAL, CKMB, CKMBINDEX, TROPONINI in the last 168  hours. BNP (last 3 results) No results for input(s): BNP in the last 8760 hours.  ProBNP (last 3 results) No results for input(s): PROBNP in the last 8760 hours.  CBG: Recent Labs  Lab 08/14/19 0753  GLUCAP 132*    Recent Results (from the past 240 hour(s))  Urine culture     Status: None (Preliminary result)   Collection Time: 08/13/19  4:13 PM   Specimen:  Urine, Clean Catch  Result Value Ref Range Status   Specimen Description URINE, CLEAN CATCH  Final   Special Requests NONE  Final   Culture   Final    CULTURE REINCUBATED FOR BETTER GROWTH Performed at [facility], [place] [address] [place], [place] [address]    Report Status PENDING  Incomplete  Culture, blood (routine x 2)     Status: None (Preliminary result)   Collection Time: 08/13/19  5:52 PM   Specimen: BLOOD  Result Value Ref Range Status   Specimen Description BLOOD RIGHT ANTECUBITAL  Final   Special Requests   Final    BOTTLES DRAWN AEROBIC AND ANAEROBIC Blood Culture results may not be optimal due to an inadequate volume of blood received in culture bottles   Culture   Final    NO GROWTH < 12 HOURS Performed at [facility], [place] [address], [place], [place] [address]    Report Status PENDING  Incomplete  Culture, blood (routine x 2)     Status: None (Preliminary result)   Collection Time: 08/13/19  5:53 PM   Specimen: BLOOD  Result Value Ref Range Status   Specimen Description BLOOD LEFT ANTECUBITAL  Final   Special Requests   Final    BOTTLES DRAWN AEROBIC AND ANAEROBIC Blood Culture adequate volume   Culture   Final    NO GROWTH < 12 HOURS Performed at [facility], [place] [address] [place], [place] [address]    Report Status PENDING  Incomplete     Studies: Dg Chest 2 View  Result Date: 08/13/2019 CLINICAL DATA:  Acute weakness. EXAM: CHEST - 2 VIEW COMPARISON:  08/07/2019 FINDINGS: The cardiomediastinal silhouette is unremarkable. Elevation of the RIGHT hemidiaphragm again noted. There is no evidence of focal airspace disease, pulmonary edema, suspicious pulmonary nodule/mass, pleural effusion, or pneumothorax. No acute bony abnormalities are identified. IMPRESSION: 1. No active cardiopulmonary disease. Electronically Signed   By: Margarette Canada M.D.   On: 08/13/2019 15:37   US Renal  Result Date: 08/13/2019 CLINICAL DATA:  73 year old male with  urinary retention and increased creatinine. EXAM: RENAL / URINARY TRACT ULTRASOUND COMPLETE COMPARISON:  05/08/2017 CT FINDINGS: Right Kidney: Renal measurements: 12.3 x 5.4 x 5.9 cm = volume: 202 mL. Mild cortical thinning noted. Echogenicity within normal limits. No mass or hydronephrosis visualized. Left Kidney: Renal measurements: 12 x 5.1 x 5.9 cm = volume: 187 mL. Mild cortical thinning noted. Echogenicity within normal limits. No mass or hydronephrosis visualized. Bladder: Appears normal for degree of bladder distention. Other: None IMPRESSION: Mild bilateral renal cortical thinning without other significant abnormality. No hydronephrosis. Electronically Signed   By: Margarette Canada M.D.   On: 08/13/2019 21:07    Scheduled Meds: . aspirin  81 mg Oral Daily  . Carbidopa-Levodopa ER  1-2 tablet Oral See admin instructions  . enoxaparin (LOVENOX) injection  40 mg Subcutaneous Q24H  . Pimavanserin Tartrate  34 mg Oral Daily  . simvastatin  20 mg Oral QPM   Continuous Infusions: . cefTRIAXone (ROCEPHIN)  IV      Principal Problem:   Orthostatic hypotension Active Problems:   Acute lower UTI   Urinary retention   HTN (hypertension)   Hemiparesis affecting left side as late effect of cerebrovascular accident ([place])   Hyperlipidemia    Time spent: 64 minutes    [place] NP [facility]  If 7PM-7AM, please contact night-coverage at [email or web address], password [facility] 08/14/2019, 11:32 AM  LOS: 0 days

## 2019-08-14 NOTE — Consult Note (Signed)
Urology Consult  Referring physician: Dr. Posey Pronto Reason for referral: Urinary retention  Chief Complaint: urinary retention  History of Present Illness: Robert [name] is a 73yo with a history of CVA,  urethral stricture disease and BPH who presented to the ER last night with dizziness and fatigue. He was found to have orthostatic hypotension and some confusion. The patient was not able to urinate int he ER and bladder scan showed 385cc. He noted a weaker stream with associated urinayr urgency, frequency, and nocturia the past 2 days. He is not currently on BPH therapy. He does have a history of urethral stricture disease and underwent urethral dilation with Dr. Risa Grill 5-6 years ago. He was last seen at [facility] in 02/2019 for his LUTS. Multiple attempts were made by the nursing staff to place a foley which were unsuccessful. No other associated symptoms. No exacerbating/alleviaitng events.   Past Medical History:  Diagnosis Date  . 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 Diverticulosis   . Frequency of urination   . GERD (gastroesophageal reflux disease)   . Gross hematuria   . History of acute pyelonephritis    10-13-2012  . History of CVA with residual deficit    2008--  left side of body weakness and foot drop (wears leg brace and uses cane)  . History of DVT of lower extremity    2008--  cva  . Hypercholesteremia   . Hyperlipidemia   . Hyperlipidemia   . Hypertension   . 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 Left leg DVT ([place])   . 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 Urethral stricture   . Urgency of urination   . Weakness of left side of body    secondary to cva 2008  . Wears glasses   . Wears hearing aid    bilateral-- wears intermittantly   Past Surgical History:  Procedure Laterality Date  . CYSTOSCOPY WITH RETROGRADE URETHROGRAM N/A 10/23/2015   Procedure: CYSTOSCOPY WITH RETROGRADE URETHROGRAM;   Surgeon: [name], MD;  Location: [facility];  Service: Urology;  Laterality: N/A;  . CYSTOSCOPY WITH URETHRAL DILATATION N/A 10/23/2015   Procedure: CYSTOSCOPY WITH URETHRAL BALLOON DILATATION;  Surgeon: [name], MD;  Location: [facility];  Service: Urology;  Laterality: N/A;  BALLOON DILATION   . IVC FILTER PLACEMENT ([place] [facility])  2008    Medications: I have reviewed the patient's current medications. Allergies:  Allergies  Allergen Reactions  . Propofol Other (See Comments)    "hiccups for weeks"    Family History  Problem Relation Age of Onset  . Diabetes Sister   . Lung cancer Brother        Post 9/11 voluntary work in [place]  . Prostate cancer Brother   . Other Mother        passed away young- non medical  . Alzheimer's disease Father   . Healthy Son    Social History:  reports that he quit smoking about 48 years ago. His smoking use included cigarettes. He quit after 10.00 years of use. He has never used smokeless tobacco. He reports that he does not drink alcohol or use drugs.  Review of Systems  Constitutional: Positive for malaise/fatigue.  Genitourinary: Positive for frequency and urgency.  All other systems reviewed and are negative.   Physical Exam:  Vital signs in last 24 hours: Temp:  [  98.4 F (36.9 C)] 98.4 F (36.9 C) (10/18 1342) Pulse Rate:  [82-110] 86 (10/18 2230) Resp:  [17-25] 23 (10/18 2230) BP: (102-158)/(61-120) 120/85 (10/18 2130) SpO2:  [91 %-100 %] 96 % (10/18 2230) Weight:  [104.3 kg] 104.3 kg (10/18 1402) Physical Exam  Constitutional: He is oriented to person, place, and time. He appears well-developed and well-nourished.  HENT:  Head: Normocephalic and atraumatic.  Eyes: Pupils are equal, round, and reactive to light. EOM are normal.  Neck: Normal range of motion. No thyromegaly present.  Cardiovascular: Normal rate and regular rhythm.  Respiratory: Effort normal. No respiratory distress.  GI:  Soft. He exhibits no distension. Hernia confirmed negative in the right inguinal area and confirmed negative in the left inguinal area.  Genitourinary:    Testes and rectum normal.  Rectum:     No abnormal anal tone.  Prostate is enlarged. Uncircumcised. No phimosis.  Musculoskeletal: Normal range of motion.        General: Edema present.  Lymphadenopathy:       Right: No inguinal adenopathy present.       Left: No inguinal adenopathy present.  Neurological: He is alert and oriented to person, place, and time.  Skin: Skin is warm and dry.  Psychiatric: He has a normal mood and affect. His behavior is normal. Judgment and thought content normal.    Laboratory Data:  Results for orders placed or performed during the hospital encounter of 08/13/19 (from the past 72 hour(s))  CBC with Differential     Status: Abnormal   Collection Time: 08/13/19  4:04 PM  Result Value Ref Range   WBC 9.1 4.0 - 10.5 K/uL   RBC 5.29 4.22 - 5.81 MIL/uL   Hemoglobin 15.8 13.0 - 17.0 g/dL   HCT 49.1 39.0 - 52.0 %   MCV 92.8 80.0 - 100.0 fL   MCH 29.9 26.0 - 34.0 pg   MCHC 32.2 30.0 - 36.0 g/dL   RDW 13.8 11.5 - 15.5 %   Platelets 249 150 - 400 K/uL   nRBC 0.0 0.0 - 0.2 %   Neutrophils Relative % 84 %   Neutro Abs 7.6 1.7 - 7.7 K/uL   Lymphocytes Relative 7 %   Lymphs Abs 0.6 (L) 0.7 - 4.0 K/uL   Monocytes Relative 9 %   Monocytes Absolute 0.8 0.1 - 1.0 K/uL   Eosinophils Relative 0 %   Eosinophils Absolute 0.0 0.0 - 0.5 K/uL   Basophils Relative 0 %   Basophils Absolute 0.0 0.0 - 0.1 K/uL   Immature Granulocytes 0 %   Abs Immature Granulocytes 0.04 0.00 - 0.07 K/uL    Comment: Performed at [facility], [address] [place], [place] [address]  Comprehensive metabolic panel     Status: Abnormal   Collection Time: 08/13/19  4:04 PM  Result Value Ref Range   Sodium 138 135 - 145 mmol/L   Potassium 4.2 3.5 - 5.1 mmol/L    Comment: SLIGHT HEMOLYSIS   Chloride 103 98 - 111 mmol/L   CO2 21  (L) 22 - 32 mmol/L   Glucose, Bld 211 (H) 70 - 99 mg/dL   BUN 16 8 - 23 mg/dL   Creatinine, Ser 1.34 (H) 0.61 - 1.24 mg/dL   Calcium 9.2 8.9 - 10.3 mg/dL   Total Protein 7.1 6.5 - 8.1 g/dL   Albumin 3.8 3.5 - 5.0 g/dL   AST 25 15 - 41 U/L   ALT 9 0 - 44 U/L  Alkaline Phosphatase 69 38 - 126 U/L   Total Bilirubin 1.3 (H) 0.3 - 1.2 mg/dL   GFR calc non Af Amer 53 (L) >60 mL/min   GFR calc Af Amer >60 >60 mL/min   Anion gap 14 5 - 15    Comment: Performed at [facility] [address] [place], [place] [address]  Urinalysis, Routine w reflex microscopic     Status: Abnormal   Collection Time: 08/13/19  4:13 PM  Result Value Ref Range   Color, Urine AMBER (A) YELLOW    Comment: BIOCHEMICALS MAY BE AFFECTED BY COLOR   APPearance CLOUDY (A) CLEAR   Specific Gravity, Urine 1.019 1.005 - 1.030   pH 5.0 5.0 - 8.0   Glucose, UA 50 (A) NEGATIVE mg/dL   Hgb urine dipstick LARGE (A) NEGATIVE   Bilirubin Urine NEGATIVE NEGATIVE   Ketones, ur 5 (A) NEGATIVE mg/dL   Protein, ur 100 (A) NEGATIVE mg/dL   Nitrite NEGATIVE NEGATIVE   Leukocytes,Ua MODERATE (A) NEGATIVE   RBC / HPF >50 (H) 0 - 5 RBC/hpf   WBC, UA >50 (H) 0 - 5 WBC/hpf   Bacteria, UA MANY (A) NONE SEEN   Squamous Epithelial / LPF 11-20 0 - 5   WBC Clumps PRESENT    Mucus PRESENT     Comment: Performed at [facility], [place] [address] [place], [place] [address]  Troponin I (High Sensitivity)     Status: Abnormal   Collection Time: 08/13/19  5:52 PM  Result Value Ref Range   Troponin I (High Sensitivity) 156 (HH) <18 ng/L    Comment: CRITICAL RESULT CALLED TO, READ BACK BY AND VERIFIED WITH: J.GALOSTER,RN @ 1917 08/13/2019 [place] Performed at [facility], [place] [address] [place], [place] [address]   Lactic acid, plasma     Status: Abnormal   Collection Time: 08/13/19  5:57 PM  Result Value Ref Range   Lactic Acid, Venous 2.0 (HH) 0.5 - 1.9 mmol/L    Comment: CRITICAL RESULT CALLED TO, READ BACK BY AND  VERIFIED WITH: A.REABOLD,RN @ 1840 08/13/2019 [place] Performed at [facility], [place] [address] [place], [place] [address]   Lactic acid, plasma     Status: None   Collection Time: 08/13/19  9:22 PM  Result Value Ref Range   Lactic Acid, Venous 1.3 0.5 - 1.9 mmol/L    Comment: Performed at [facility] [address] [place], [place] [address]  Troponin I (High Sensitivity)     Status: Abnormal   Collection Time: 08/13/19  9:22 PM  Result Value Ref Range   Troponin I (High Sensitivity) 127 (HH) <18 ng/L    Comment: CRITICAL VALUE NOTED.  VALUE IS CONSISTENT WITH PREVIOUSLY REPORTED AND CALLED VALUE. (NOTE) Elevated high sensitivity troponin I (hsTnI) values and significant  changes across serial measurements may suggest ACS but many other  chronic and acute conditions are known to elevate hsTnI results.  Refer to the Links section for chest pain algorithms and additional  guidance. Performed at [facility], [place] [address], [place] [address]   CBC     Status: None   Collection Time: 08/14/19  4:06 AM  Result Value Ref Range   WBC 9.6 4.0 - 10.5 K/uL   RBC 5.10 4.22 - 5.81 MIL/uL   Hemoglobin 15.0 13.0 - 17.0 g/dL   HCT 46.6 39.0 - 52.0 %   MCV 91.4 80.0 - 100.0 fL  MCH 29.4 26.0 - 34.0 pg   MCHC 32.2 30.0 - 36.0 g/dL   RDW 13.8 11.5 - 15.5 %   Platelets 217 150 - 400 K/uL   nRBC 0.0 0.0 - 0.2 %    Comment: Performed at [facility], [place] [address], [place], [place] [address]   No results found for this or any previous visit (from the past 240 hour(s)). Creatinine: Recent Labs    08/13/19 1604  CREATININE 1.34*   Baseline Creatinine: 1.3  Impression/Assessment:  73yo with BPh and urinary retention  Plan:  1. 16 french coude catheter placed in the ER and 600cc of dark urine drained. The foley is to remain in place for 7-10 days prior to voiding trial. We discussed starting alpha blocker therapy for his BPH but we will await  resolution of his orthostatic hypotension prior to starting flomax 0.4mg  daily.  Nicolette Bang 08/14/2019, 5:42 AM

## 2019-08-14 NOTE — ED Notes (Signed)
Breakfast Ordered 

## 2019-08-14 NOTE — ED Notes (Signed)
Urology cart at bedside 

## 2019-08-14 NOTE — ED Notes (Signed)
Urologist notified about urology RN's being unable to place catheters. Multiple attempts by those RN's with 45 F and 12 F. Dr. On the way to place catheter

## 2019-08-14 NOTE — Progress Notes (Signed)
At 2200, attempted to place 16 fr.coude cath. Resistance met. Used lidocaine gel, but still very uncomfortable for patient. Unsuccessful attempt reported to patient's nurse. At [place], attempted to place 68f coude cath, per patient's nurse/doctor request. Continue  to meet resistance and uncomfortable for patient. Made nurse aware, that urologist will need to be notified., no bleeding observed. Replaced foreskin after attempt. MPatrici RanksA

## 2019-08-14 NOTE — ED Notes (Signed)
TELE 

## 2019-08-14 NOTE — ED Notes (Signed)
Pt received in bed. Pt reoriented to situation  And urged to leave catheter at penis alone. Pt repositioned in bed. Report to [place], [place]

## 2019-08-14 NOTE — ED Notes (Signed)
Rn called for hospital bed to be delivered.

## 2019-08-15 ENCOUNTER — Inpatient Hospital Stay (HOSPITAL_COMMUNITY): Payer: Medicare Other

## 2019-08-15 ENCOUNTER — Ambulatory Visit: Payer: Medicare Other | Admitting: Cardiology

## 2019-08-15 DIAGNOSIS — Z86718 Personal history of other venous thrombosis and embolism: Secondary | ICD-10-CM

## 2019-08-15 DIAGNOSIS — I69354 Hemiplegia and hemiparesis following cerebral infarction affecting left non-dominant side: Secondary | ICD-10-CM

## 2019-08-15 DIAGNOSIS — I82412 Acute embolism and thrombosis of left femoral vein: Secondary | ICD-10-CM

## 2019-08-15 DIAGNOSIS — R609 Edema, unspecified: Secondary | ICD-10-CM

## 2019-08-15 DIAGNOSIS — N390 Urinary tract infection, site not specified: Secondary | ICD-10-CM

## 2019-08-15 DIAGNOSIS — N3001 Acute cystitis with hematuria: Secondary | ICD-10-CM

## 2019-08-15 DIAGNOSIS — R339 Retention of urine, unspecified: Secondary | ICD-10-CM

## 2019-08-15 LAB — BASIC METABOLIC PANEL
Anion gap: 9 (ref 5–15)
BUN: 14 mg/dL (ref 8–23)
CO2: 22 mmol/L (ref 22–32)
Calcium: 8.9 mg/dL (ref 8.9–10.3)
Chloride: 107 mmol/L (ref 98–111)
Creatinine, Ser: 1.07 mg/dL (ref 0.61–1.24)
GFR calc Af Amer: >60 mL/min (ref >60)
GFR calc non Af Amer: >60 mL/min (ref >60)
Glucose, Bld: 138 mg/dL — ABNORMAL HIGH (ref 70–99)
Potassium: 3.7 mmol/L (ref 3.5–5.1)
Sodium: 138 mmol/L (ref 135–145)

## 2019-08-15 LAB — URINE CULTURE: Culture: >=100000 — AB

## 2019-08-15 MED ORDER — APIXABAN 5 MG PO TABS
10.0000 mg | ORAL_TABLET | Freq: Two times a day (BID) | ORAL | Status: DC
  Administered 2019-08-15 – 2019-08-19 (×9): 10 mg via ORAL
  Filled 2019-08-15 (×10): qty 2

## 2019-08-15 MED ORDER — APIXABAN 5 MG PO TABS: 5.0000 mg | ORAL_TABLET | Freq: Two times a day (BID) | ORAL | Status: DC

## 2019-08-15 NOTE — Progress Notes (Addendum)
PROGRESS NOTE    [name]   [id number]  DOB: January 02, 1946  DOA: 08/13/2019 PCP: [name], MD   Brief Narrative:  Robert [name] 73 year old male with history of Parkinson's disease, large CVA with left hemiparesis (8 yrs ago) and subsequent DVT, IVC filter, history of urethral stricture was brought to the emergency room with 1 to 2 days of weakness. He was clammy and lightheaded. EMS was called. Noted to have hypotension with SBP in 70s & dark urine. Noted to have urinary retention with PVR of 348 ccin ED. Foley not able to be placed by nursing staff and thus urology consulted who placed a coude cath. Also noted to have a UTI with hematuria.   He was noted to have pitting edema bilaterally and a LE venous duplex was ordered due to h/o prior DVT. Troponin 156. Lactic acid 2.0. Cr elevated from baseline at 1.34 and mildly acidotic with CO2 21.    Subjective: He has no complaints. Intrimittently confused today.     Assessment & Plan:   Principal Problem:   Orthostatic hypotension - HR 100 sitting > 144 with standing - possibly dehydration vs Shy Dragger's syndrome - he had some mild tachycardia with HR of 103 - repeat orthostatic today show HR increases from 79- 120 with standing but BP increases instead of dropping- he was very weak and knees were bucking but no mention of dizziess  Active Problems: Complicated UTI with urinary retention and acute encephalopathy Severe Sepsis with tachycardia, hypotension and elevated LA -  h/o urethral stricture and dilatation in the past by Dr Risa Grill - has h/o lower urinary tract symptoms- frequency/ urgency - now with Coude cath placed by Urology, Dr Noah Delaine - will need to f/u with Urology in 7-10 days for voiding trial - Urology holding off on alpha blocker for now as he was orthostatic - blood cultures NGTD -cont Ceftriaxone- culture growing > 1 K strep - his wife states he confusion is better but he is not back to baseline    Elevated Cr - Renal ultrasound on 10/19> Mild bilateral renal cortical thinning  - baseline Cr 0.9- Cr 1.34 on admission- has improved- likely due to BOO and possibly dehydration - Cr improving  Elevated Troponin - likely due to hypotension- as trending down, will not repeat   Acute Left DVT - 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) 8 yrs ago- received IVC filter  - has been found to have an extensive left leg DVT-  common femoral vein, left femoral vein, left proximal profunda vein, and left popliteal vein - probably sedentary due to Parkinson's - His IVC filter will prevent PE but will not prevent this clot from progressing and 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-  - for now will start Eliquis- will f/u on PT eval and fall risk- Will see if he developed hematuria     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- h/o cerebrovascular accident  - obtain PT eval - on daily aspirin- will hold as we are starting Eliquis- - uses a Cane at baseline-    Hyperlipidemia - on statin  Parkinson's disease - cont Sinemet and Nuplazid- he follows with neurology    HTN (hypertension) - Losartan held in setting of AKI- cont to hold for now- monitor BP     Hearing loss   Time spent in minutes: 45 min DVT prophylaxis: Eliquis Code Status: Full code Family Communication:  I spoke with his wife today- she is agreeing with  SNF Disposition Plan: SNF- usually uses cane- only able to stand today, 2 + assist - f/u for resolution of confusion- f/u bleeding on Eliquis Consultants:   urology Procedures:   Coude cath 10/19 Antimicrobials:  Anti-infectives (From admission, onward)   Start     Dose/Rate Route Frequency Ordered Stop   08/15/19 1800  cefTRIAXone (ROCEPHIN) 1 g in sodium chloride 0.9 % 100 mL IVPB  Status:  Discontinued     1 g 200 mL/hr over 30 Minutes Intravenous Every 24 hours 08/14/19 1354 08/14/19 1355   08/14/19 1800  cefTRIAXone (ROCEPHIN) 1 g in sodium chloride  0.9 % 100 mL IVPB     1 g 200 mL/hr over 30 Minutes Intravenous Every 24 hours 08/14/19 1355     08/14/19 1200  cefTRIAXone (ROCEPHIN) 1 g in sodium chloride 0.9 % 100 mL IVPB  Status:  Discontinued     1 g 200 mL/hr over 30 Minutes Intravenous Every 24 hours 08/13/19 2359 08/14/19 1354   08/13/19 1745  cefTRIAXone (ROCEPHIN) 1 g in sodium chloride 0.9 % 100 mL IVPB     1 g 200 mL/hr over 30 Minutes Intravenous  Once 08/13/19 1732 08/13/19 1902       Objective: Vitals:   08/14/19 1554 08/14/19 1631 08/14/19 2131 08/15/19 0552  BP:  (!) 150/79 140/88 139/78  Pulse: 98 93 91 93  Resp: 19 19 17 19   Temp:  100.3 F (37.9 C) 99.1 F (37.3 C) 98.2 F (36.8 C)  TempSrc:  Oral Axillary Axillary  SpO2: 96% 98% 97% 100%  Weight:  109.6 kg    Height:  6\' 2"  (1.88 m)      Intake/Output Summary (Last 24 hours) at 08/15/2019 1054 Last data filed at 08/15/2019 1031 Gross per 24 hour  Intake 120 ml  Output 1250 ml  Net -1130 ml   Filed Weights   08/13/19 1402 08/14/19 1631  Weight: 104.3 kg 109.6 kg    Examination: General exam: Appears comfortable  HEENT: PERRLA, oral mucosa moist, no sclera icterus or thrush Respiratory system: Clear to auscultation. Respiratory effort normal. Cardiovascular system: S1 & S2 heard, RRR.   Gastrointestinal system: Abdomen soft, non-tender, nondistended. Normal bowel sounds. Central nervous system: Alert and oriented to place and person but not to situation.  Weak on left side. Extremities: No cyanosis, clubbing - 1+ edema of both legs Skin: No rashes or ulcers Psychiatry:  Mood & affect appropriate.     Data Reviewed: I have personally reviewed following labs and imaging studies  CBC: Recent Labs  Lab 08/13/19 1604 08/14/19 0406  WBC 9.1 9.6  NEUTROABS 7.6  --   HGB 15.8 15.0  HCT 49.1 46.6  MCV 92.8 91.4  PLT 249 [id number]   Basic Metabolic Panel: Recent Labs  Lab 08/13/19 1604 08/14/19 0406 08/15/19 0237  NA 138 139 138  K 4.2  3.8 3.7  CL 103 107 107  CO2 21* 22 22  GLUCOSE 211* 131* 138*  BUN 16 14 14   CREATININE 1.34* 0.96 1.07  CALCIUM 9.2 8.8* 8.9   GFR: Estimated Creatinine Clearance: 82.3 mL/min (by C-G formula based on SCr of 1.07 mg/dL). Liver Function Tests: Recent Labs  Lab 08/13/19 1604  AST 25  ALT 9  ALKPHOS 69  BILITOT 1.3*  PROT 7.1  ALBUMIN 3.8   No results for input(s): LIPASE, AMYLASE in the last 168 hours. No results for input(s): AMMONIA in the last 168 hours. Coagulation Profile: No results for input(s): INR,  PROTIME in the last 168 hours. Cardiac Enzymes: No results for input(s): CKTOTAL, CKMB, CKMBINDEX, TROPONINI in the last 168 hours. BNP (last 3 results) No results for input(s): PROBNP in the last 8760 hours. HbA1C: No results for input(s): HGBA1C in the last 72 hours. CBG: Recent Labs  Lab 08/14/19 0753  GLUCAP 132*   Lipid Profile: No results for input(s): CHOL, HDL, LDLCALC, TRIG, CHOLHDL, LDLDIRECT in the last 72 hours. Thyroid Function Tests: No results for input(s): TSH, T4TOTAL, FREET4, T3FREE, THYROIDAB in the last 72 hours. Anemia Panel: No results for input(s): VITAMINB12, FOLATE, FERRITIN, TIBC, IRON, RETICCTPCT in the last 72 hours. Urine analysis:    Component Value Date/Time   COLORURINE AMBER (A) 08/13/2019 1613   APPEARANCEUR CLOUDY (A) 08/13/2019 1613   LABSPEC 1.019 08/13/2019 1613   PHURINE 5.0 08/13/2019 1613   GLUCOSEU 50 (A) 08/13/2019 1613   GLUCOSEU 100 (A) 02/06/2019 0813   HGBUR LARGE (A) 08/13/2019 1613   BILIRUBINUR NEGATIVE 08/13/2019 1613   KETONESUR 5 (A) 08/13/2019 1613   PROTEINUR 100 (A) 08/13/2019 1613   UROBILINOGEN 1.0 02/06/2019 0813   NITRITE NEGATIVE 08/13/2019 1613   LEUKOCYTESUR MODERATE (A) 08/13/2019 1613   Sepsis Labs: @LABRCNTIP (procalcitonin:4,lacticidven:4) ) Recent Results (from the past 240 hour(s))  Urine culture     Status: Abnormal   Collection Time: 08/13/19  4:13 PM   Specimen: Urine, Clean  Catch  Result Value Ref Range Status   Specimen Description URINE, CLEAN CATCH  Final   Special Requests   Final    NONE Performed at [facility], [place] [address] [place], [place] [address]    Culture >=100,000 COLONIES/mL STREPTOCOCCUS SPECIES (A)  Final   Report Status 08/15/2019 FINAL  Final  Culture, blood (routine x 2)     Status: None (Preliminary result)   Collection Time: 08/13/19  5:52 PM   Specimen: BLOOD  Result Value Ref Range Status   Specimen Description BLOOD RIGHT ANTECUBITAL  Final   Special Requests   Final    BOTTLES DRAWN AEROBIC AND ANAEROBIC Blood Culture results may not be optimal due to an inadequate volume of blood received in culture bottles   Culture   Final    NO GROWTH 2 DAYS Performed at [facility], [place] [address] [place], [place] [address]    Report Status PENDING  Incomplete  Culture, blood (routine x 2)     Status: None (Preliminary result)   Collection Time: 08/13/19  5:53 PM   Specimen: BLOOD  Result Value Ref Range Status   Specimen Description BLOOD LEFT ANTECUBITAL  Final   Special Requests   Final    BOTTLES DRAWN AEROBIC AND ANAEROBIC Blood Culture adequate volume   Culture   Final    NO GROWTH 2 DAYS Performed at [facility], [place] [address], [place], [place] [address]    Report Status PENDING  Incomplete  SARS CORONAVIRUS 2 (TAT 6-24 HRS) Nasopharyngeal Nasopharyngeal Swab     Status: None   Collection Time: 08/14/19  5:37 AM   Specimen: Nasopharyngeal Swab  Result Value Ref Range Status   SARS Coronavirus 2 NEGATIVE NEGATIVE Final    Comment: (NOTE) SARS-CoV-2 target nucleic acids are NOT DETECTED. The SARS-CoV-2 RNA is generally detectable in upper and lower respiratory specimens during the acute phase of infection. Negative results do not preclude SARS-CoV-2 infection, do not rule out co-infections with other pathogens, and should not be used as the sole basis for treatment or  other patient management  decisions. Negative results must be combined with clinical observations, patient history, and epidemiological information. The expected result is Negative. Fact Sheet for Patients: SugarRoll.be Fact Sheet for Healthcare Providers: https://www.woods-mathews.com/ This test is not yet approved or cleared by the [place] FDA and  has been authorized for detection and/or diagnosis of SARS-CoV-2 by FDA under an Emergency Use Authorization (EUA). This EUA will remain  in effect (meaning this test can be used) for the duration of the COVID-19 declaration under Section 56 4(b)(1) of the Act, 21 U.S.C. section 360bbb-3(b)(1), unless the authorization is terminated or revoked sooner. Performed at [facility], [place] [address], [place], [place] [address]          Radiology Studies: Dg Chest 2 View  Result Date: 08/13/2019 CLINICAL DATA:  Acute weakness. EXAM: CHEST - 2 VIEW COMPARISON:  08/07/2019 FINDINGS: The cardiomediastinal silhouette is unremarkable. Elevation of the RIGHT hemidiaphragm again noted. There is no evidence of focal airspace disease, pulmonary edema, suspicious pulmonary nodule/mass, pleural effusion, or pneumothorax. No acute bony abnormalities are identified. IMPRESSION: 1. No active cardiopulmonary disease. Electronically Signed   By: Margarette Canada M.D.   On: 08/13/2019 15:37   US Renal  Result Date: 08/13/2019 CLINICAL DATA:  73 year old male with urinary retention and increased creatinine. EXAM: RENAL / URINARY TRACT ULTRASOUND COMPLETE COMPARISON:  05/08/2017 CT FINDINGS: Right Kidney: Renal measurements: 12.3 x 5.4 x 5.9 cm = volume: 202 mL. Mild cortical thinning noted. Echogenicity within normal limits. No mass or hydronephrosis visualized. Left Kidney: Renal measurements: 12 x 5.1 x 5.9 cm = volume: 187 mL. Mild cortical thinning noted. Echogenicity within normal limits. No mass or hydronephrosis visualized. Bladder:  Appears normal for degree of bladder distention. Other: None IMPRESSION: Mild bilateral renal cortical thinning without other significant abnormality. No hydronephrosis. Electronically Signed   By: Margarette Canada M.D.   On: 08/13/2019 21:07   Vas [place] Lower Extremity Venous (dvt)  Result Date: 08/15/2019  Lower Venous Study Indications: Swelling.  Limitations: Body habitus, poor ultrasound/tissue interface and overlying bowel gas. Comparison Study: No prior study. Performing Technologist: Maudry Mayhew MHA, RDMS, RVT, RDCS  Examination Guidelines: A complete evaluation includes B-mode imaging, spectral Doppler, color Doppler, and power Doppler as needed of all accessible portions of each vessel. Bilateral testing is considered an integral part of a complete examination. Limited examinations for reoccurring indications may be performed as noted.  +---------+---------------+---------+-----------+----------+--------------+  RIGHT     Compressibility Phasicity Spontaneity Properties Thrombus Aging  +---------+---------------+---------+-----------+----------+--------------+  CFV       Full            Yes       Yes                                    +---------+---------------+---------+-----------+----------+--------------+  SFJ       Full                                                             +---------+---------------+---------+-----------+----------+--------------+  FV Prox   Full                                                             +---------+---------------+---------+-----------+----------+--------------+  FV Mid    Full                                                             +---------+---------------+---------+-----------+----------+--------------+  FV Distal Full                                                             +---------+---------------+---------+-----------+----------+--------------+  PFV       Full                                                              +---------+---------------+---------+-----------+----------+--------------+  POP       Full            Yes       Yes                                    +---------+---------------+---------+-----------+----------+--------------+  PTV       Full                                                             +---------+---------------+---------+-----------+----------+--------------+  PERO      Full                                                             +---------+---------------+---------+-----------+----------+--------------+  CIV                                 Yes                                    +---------+---------------+---------+-----------+----------+--------------+  +---------+---------------+---------+-----------+----------+--------------+  LEFT      Compressibility Phasicity Spontaneity Properties Thrombus Aging  +---------+---------------+---------+-----------+----------+--------------+  CFV       None                      No                     Acute           +---------+---------------+---------+-----------+----------+--------------+  SFJ       None  Acute           +---------+---------------+---------+-----------+----------+--------------+  FV Prox   None                                             Acute           +---------+---------------+---------+-----------+----------+--------------+  FV Mid    None                                             Acute           +---------+---------------+---------+-----------+----------+--------------+  FV Distal None                                             Acute           +---------+---------------+---------+-----------+----------+--------------+  PFV       None                                             Acute           +---------+---------------+---------+-----------+----------+--------------+  POP       None                      No                     Acute            +---------+---------------+---------+-----------+----------+--------------+  EIV                                 Yes                                    +---------+---------------+---------+-----------+----------+--------------+  CIV                                 Yes                                    +---------+---------------+---------+-----------+----------+--------------+ Unable to visualize left posterior tibial and peroneal veins. Unable to visualize IVC.  Summary: Right: There is no evidence of deep vein thrombosis in the lower extremity. No cystic structure found in the popliteal fossa. Left: Findings consistent with acute deep vein thrombosis involving the left common femoral vein, left femoral vein, left proximal profunda vein, and left popliteal vein. No cystic structure found in the popliteal fossa.  *See table(s) above for measurements and observations.    Preliminary       Scheduled Meds:  carbidopa-levodopa  1 tablet Oral BID WC   And   Carbidopa-Levodopa ER  1 tablet Oral QHS   Chlorhexidine Gluconate Cloth  6 each Topical Daily   Pimavanserin Tartrate  34 mg Oral Daily   simvastatin  20 mg Oral QPM   Continuous Infusions:  cefTRIAXone (ROCEPHIN)  IV 1 g (08/14/19 1741)     LOS: 1 day      Debbe Odea, MD [facility] Pager: [email or web address] Password [email or web address] 08/15/2019, 10:54 AM

## 2019-08-15 NOTE — Progress Notes (Signed)
PT eval complete, formal note pending. Will require SNF and 24/7 assist.  Deniece Ree PT, DPT, CBIS  Supplemental Physical Therapist [facility]    Pager [phone] Acute Rehab Office [phone]

## 2019-08-15 NOTE — Progress Notes (Signed)
Per MD, patient with acute DVT but does have IVC filter so OK to see prior to starting anticoagulation. PT to continue with evaluation as scheduled.  Deniece Ree PT, DPT, CBIS  Supplemental Physical Therapist [facility]    Pager [phone] Acute Rehab Office [phone]

## 2019-08-15 NOTE — Progress Notes (Signed)
Rehab Admissions Coordinator Note:  Patient was screened by Cleatrice Burke for appropriateness for an Inpatient Acute Rehab Consult per OT recs. .  At this time, we are recommending Inpatient Rehab consult. Please place order for consult.  Cleatrice Burke RN MSN 08/15/2019, 5:50 PM  I can be reached at [phone].

## 2019-08-15 NOTE — Evaluation (Signed)
Physical Therapy Evaluation Patient Details Name: Robert [name] MRN: [id number] DOB: [birth year] Today's Date: 08/15/2019   History of Present Illness  73 yo male presenting with fatigue, difficulty urinating, hypotension in ED. Admitted for orthostatic hypotension and urinary retention. PMH parkinsons, CVA with L weakness and L drop foot, hx DVT, HTN, IVC filter, HOH  Clinical Impression   Patient received in bed, very pleasant and jovial with PT but tends to be very particular and directs care throughout session- had quite a few moments of education during session regarding safety. Required MaxA with HOB elevated to get to EOB, then sat at EOB with posterior lean requiring Min guard to MinA to maintain upright. Actually able to perform sit to stand twice with min guard x2 and bed elevated almost to max height. Fatigues easily, did show bilateral knee buckling just standing to take BP. He was returned to bed with maxAx2, left in bed with all needs met and bed alarm active this morning. He will continue to benefit from skilled PT services in the acute setting, recommending SNF and 24/7 assist moving forward.     Follow Up Recommendations SNF;Supervision/Assistance - 24 hour    Equipment Recommendations  Other (comment)(TBD)    Recommendations for Other Services       Precautions / Restrictions Precautions Precautions: Fall;Other (comment) Precaution Comments: watch vitals, old L hemiparesis and foot drop from prior CVA Restrictions Weight Bearing Restrictions: No      Mobility  Bed Mobility Overal bed mobility: Needs Assistance Bed Mobility: Supine to Sit;Sit to Supine     Supine to sit: Max assist;HOB elevated Sit to supine: Max assist;+2 for physical assistance   General bed mobility comments: MaxAx1 with HOB elevated to get to EOB, MaxAx2 for back to bed due to fatigue. Often states he is "sliding backwards" when he is only mildly with posterior lean  Transfers Overall  transfer level: Needs assistance Equipment used: Straight cane Transfers: Sit to/from Stand Sit to Stand: Min guard;+2 safety/equipment         General transfer comment: stood x2 with min guard of 2 for safety, limited by vitals in standing. Very shakey and knees began to buckle just standing to take orthostatic BP  Ambulation/Gait             General Gait Details: DNT- safety  Stairs            Wheelchair Mobility    Modified Rankin (Stroke Patients Only)       Balance Overall balance assessment: Needs assistance Sitting-balance support: Single extremity supported;Feet supported Sitting balance-Leahy Scale: Fair Sitting balance - Comments: posterior lean Postural control: Posterior lean Standing balance support: Single extremity supported;During functional activity Standing balance-Leahy Scale: Poor                               Pertinent Vitals/Pain Pain Assessment: No/denies pain    Home Living Family/patient expects to be discharged to:: Private residence Living Arrangements: Spouse/significant other Available Help at Discharge: Family;Available 24 hours/day Type of Home: Apartment Home Access: Level entry     Home Layout: One level Home Equipment: Cane - single point;Shower seat;Grab bars - toilet;Hand held shower head;Wheelchair - manual;Toilet riser      Prior Function Level of Independence: Needs assistance   Gait / Transfers Assistance Needed: can walk with cane  ADL's / Homemaking Assistance Needed: wife helps with dressing/bathing/everything        Hand Dominance  Dominant Hand: Right    Extremity/Trunk Assessment   Upper Extremity Assessment Upper Extremity Assessment: Generalized weakness(L hemiparesis)    Lower Extremity Assessment Lower Extremity Assessment: RLE deficits/detail;LLE deficits/detail RLE Deficits / Details: generally weak LLE Deficits / Details: L hemiparesis LLE Sensation: decreased  proprioception LLE Coordination: decreased gross motor;decreased fine motor    Cervical / Trunk Assessment Cervical / Trunk Assessment: Kyphotic  Communication   Communication: No difficulties  Cognition Arousal/Alertness: Awake/alert Behavior During Therapy: WFL for tasks assessed/performed Overall Cognitive Status: Within Functional Limits for tasks assessed                                 General Comments: WNL for all cognitive tasks but does try to direct session- we had quite a few back and forths on the safest way to do things at eval      General Comments      Exercises     Assessment/Plan    PT Assessment Patient needs continued PT services  PT Problem List Decreased strength;Decreased knowledge of use of DME;Obesity;Decreased activity tolerance;Decreased safety awareness;Decreased balance;Decreased mobility;Decreased coordination       PT Treatment Interventions DME instruction;Balance training;Gait training;Neuromuscular re-education;Stair training;Functional mobility training;Patient/family education;Therapeutic activities;Therapeutic exercise    PT Goals (Current goals can be found in the Care Plan section)  Acute Rehab PT Goals Patient Stated Goal: feel better PT Goal Formulation: With patient Time For Goal Achievement: 08/29/19 Potential to Achieve Goals: Good    Frequency Min 2X/week   Barriers to discharge        Co-evaluation               AM-PAC PT "6 Clicks" Mobility  Outcome Measure Help needed turning from your back to your side while in a flat bed without using bedrails?: A Lot Help needed moving from lying on your back to sitting on the side of a flat bed without using bedrails?: A Lot Help needed moving to and from a bed to a chair (including a wheelchair)?: A Lot Help needed standing up from a chair using your arms (e.g., wheelchair or bedside chair)?: A Lot Help needed to walk in hospital room?: A Lot Help needed  climbing 3-5 steps with a railing? : Total 6 Click Score: 11    End of Session Equipment Utilized During Treatment: Gait belt Activity Tolerance: Patient limited by fatigue;Treatment limited secondary to medical complications (Comment)(limited by vitals)   Nurse Communication: Mobility status PT Visit Diagnosis: Unsteadiness on feet (R26.81);Difficulty in walking, not elsewhere classified (R26.2);Other abnormalities of gait and mobility (R26.89);Hemiplegia and hemiparesis Hemiplegia - Right/Left: Left Hemiplegia - dominant/non-dominant: Non-dominant Hemiplegia - caused by: Cerebral infarction    Time: 1130-1225 PT Time Calculation (min) (ACUTE ONLY): 55 min   Charges:   PT Evaluation $PT Eval Moderate Complexity: 1 Mod PT Treatments $Therapeutic Activity: 38-52 mins        Deniece Ree PT, DPT, CBIS  Supplemental Physical Therapist [place]    Pager [phone] Acute Rehab Office [phone]

## 2019-08-15 NOTE — Discharge Instructions (Signed)
Information on my medicine - ELIQUIS (apixaban)  This medication education was reviewed with me or my healthcare representative as part of my discharge preparation.  The pharmacist that spoke with me during my hospital stay was:  Arty Baumgartner, [facility]  Why was Eliquis prescribed for you? Eliquis was prescribed to treat blood clots that may have been found in the veins of your legs (deep vein thrombosis) or in your lungs (pulmonary embolism) and to reduce the risk of them occurring again.  What do You need to know about Eliquis ? The starting dose is 10 mg (two 5 mg tablets) taken TWICE daily for the FIRST SEVEN (7) DAYS, then on 08/22/19,  the dose is reduced to ONE 5 mg tablet taken TWICE daily.  Eliquis may be taken with or without food.   Try to take the dose about the same time in the morning and in the evening. If you have difficulty swallowing the tablet whole please discuss with your pharmacist how to take the medication safely.  Take Eliquis exactly as prescribed and DO NOT stop taking Eliquis without talking to the doctor who prescribed the medication.  Stopping may increase your risk of developing a new blood clot.  Refill your prescription before you run out.  After discharge, you should have regular check-up appointments with your healthcare provider that is prescribing your Eliquis.    What do you do if you miss a dose? If a dose of ELIQUIS is not taken at the scheduled time, take it as soon as possible on the same day and twice-daily administration should be resumed. The dose should not be doubled to make up for a missed dose.  Important Safety Information A possible side effect of Eliquis is bleeding. You should call your healthcare provider right away if you experience any of the following: ? Bleeding from an injury or your nose that does not stop. ? Unusual colored urine (red or dark brown) or unusual colored stools (red or black). ? Unusual bruising for  unknown reasons. ? A serious fall or if you hit your head (even if there is no bleeding).  Some medicines may interact with Eliquis and might increase your risk of bleeding or clotting while on Eliquis. To help avoid this, consult your healthcare provider or pharmacist prior to using any new prescription or non-prescription medications, including herbals, vitamins, non-steroidal anti-inflammatory drugs (NSAIDs) and supplements.  This website has more information on Eliquis (apixaban): http://[email or web address].com/eliquis/home

## 2019-08-15 NOTE — Progress Notes (Signed)
ANTICOAGULATION CONSULT NOTE - Initial Consult  Pharmacy Consult for Eliquis Indication: DVT, left leg  Allergies  Allergen Reactions  . Propofol Other (See Comments)    "hiccups for weeks"    Patient Measurements: Height: 6\' 2"  (188 cm) Weight: 241 lb 10 oz (109.6 kg) IBW/kg (Calculated) : 82.2  Vital Signs: Temp: 98.2 F (36.8 C) (10/20 0552) Temp Source: Axillary (10/20 0552) BP: 139/78 (10/20 0552) Pulse Rate: 93 (10/20 0552)  Labs: Recent Labs    08/13/19 1604 08/13/19 1752 08/13/19 2122 08/14/19 0406 08/15/19 0237  HGB 15.8  --   --  15.0  --   HCT 49.1  --   --  46.6  --   PLT 249  --   --  217  --   CREATININE 1.34*  --   --  0.96 1.07  TROPONINIHS  --  156* 127*  --   --     Estimated Creatinine Clearance: 82.3 mL/min (by C-G formula based on SCr of 1.07 mg/dL).   Medical History: Past Medical History:  Diagnosis Date  . BPH (benign prostatic hyperplasia)   . 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 Diverticulosis   . Frequency of urination   . GERD (gastroesophageal reflux disease)   . Gross hematuria   . History of acute pyelonephritis    10-13-2012  . History of CVA with residual deficit    2008--  left side of body weakness and foot drop (wears leg brace and uses cane)  . History of DVT of lower extremity    2008--  cva  . Hypercholesteremia   . Hyperlipidemia   . Hyperlipidemia   . Hypertension   . 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 Urethral stricture   . Urgency of urination   . Urinary retention   . Weakness of left side of body    secondary to cva 2008  . Wears glasses   . Wears hearing aid    bilateral-- wears intermittantly   Assessment:  73 yr old male to begin Eliquis for left leg DVT.  Hx DVT and CVA, in 2008 per wife.  Has IVC filter since that time.  Residual L hemiparesis.  He reports that he  walks daily.  Noted plan for PT evaluation d/t fall risk.     Lovenox 40 mg SQ given ~6pm on 10/19.  Goal of Therapy:  appropriate Eliquis dose for indication Monitor platelets by anticoagulation protocol: Yes   Plan:   Lovenox discontinued.  Eliquis 10 mg BID x 1 week, then 5 mg BID.  Arty Baumgartner, [place] Pager: [phone] or phone: [phone] 08/15/2019,11:09 AM

## 2019-08-15 NOTE — Plan of Care (Signed)
  Problem: Pain Managment: Goal: General experience of comfort will improve Outcome: Progressing   Problem: Safety: Goal: Ability to remain free from injury will improve Outcome: Progressing   Problem: Skin Integrity: Goal: Risk for impaired skin integrity will decrease Outcome: Progressing   

## 2019-08-15 NOTE — Progress Notes (Signed)
Bilateral lower extremity venous duplex completed. Refer to "CV Proc" under chart review to view preliminary results.  Critical results discussed with RN Percell Locus and messaged to Dr. Wynelle Cleveland via secure chat.  08/15/2019 9:51 AM Maudry Mayhew, MHA, RVT, RDCS, RDMS

## 2019-08-15 NOTE — Progress Notes (Signed)
Occupational Therapy Evaluation Patient Details Name: Robert [name] MRN: [id number] DOB: 15-Feb-1946 Today's Date: 08/15/2019    History of Present Illness 73 yo male presenting with fatigue, difficulty urinating, hypotension in ED. Admitted for orthostatic hypotension and urinary retention. PMH parkinsons, CVA with L weakness and L drop foot, hx DVT, HTN, IVC filter, HOH   Clinical Impression   PTA, pt lived with wife in first level apartment and required assist for all BADLs, and independent with [facility] for functional mobility. Pt currently presents with decreased balance, strength, activity tolerance, and cognition impacting safe performance of ADLs. Pt required min A-mod A for UB ADLs, and max A +2 for LB ADLs and functional transfers. Pt would benefit from OT acutely to facilitate safe dc.  Due to motivation to increase independence and good family support, recommend dc to CIR to improve safe performance of ADLs     Follow Up Recommendations  CIR;Supervision/Assistance - 24 hour    Equipment Recommendations  Other (comment)(defer to next venue)    Recommendations for Other Services PT consult;Rehab consult     Precautions / Restrictions Precautions Precautions: Fall;Other (comment) Precaution Comments: watch vitals, old L hemiparesis and foot drop from prior CVA Restrictions Weight Bearing Restrictions: No      Mobility Bed Mobility Overal bed mobility: Needs Assistance Bed Mobility: Supine to Sit     Supine to sit: Mod assist;HOB elevated;+2 for physical assistance Sit to supine: Max assist;+2 for physical assistance   General bed mobility comments: mod A to HOB elevated to bring legs to EOB and raise trunk into sitting  Transfers Overall transfer level: Needs assistance Equipment used: Straight cane Transfers: Sit to/from [employer] Transfers Sit to Stand: Max assist;+2 physical assistance;+2 safety/equipment;From elevated surface Stand pivot transfers: Max  assist;+2 physical assistance;+2 safety/equipment       General transfer comment: pt required max A +2 to power up into standing with bilateral knee block and anterior weight shift    Balance Overall balance assessment: Needs assistance Sitting-balance support: Single extremity supported;Feet supported Sitting balance-Leahy Scale: Poor Sitting balance - Comments: posterior lean Postural control: Posterior lean Standing balance support: Single extremity supported;During functional activity Standing balance-Leahy Scale: Zero Standing balance comment: required max A +2 to maintain balance in static standing                           ADL either performed or assessed with clinical judgement   ADL Overall ADL's : Needs assistance/impaired Eating/Feeding: Supervision/ safety;Sitting   Grooming: Sitting;Min guard   Upper Body Bathing: Sitting;Minimal assistance   Lower Body Bathing: Sit to/from stand;Maximal assistance;+2 for physical assistance;+2 for safety/equipment   Upper Body Dressing : Minimal assistance;Sitting   Lower Body Dressing: Sit to/from stand;+2 for physical assistance;+2 for safety/equipment;Total assistance Lower Body Dressing Details (indicate cue type and reason): pt required max A for sitting balance and total A for donning socks and shoes sitting EOB.  Toilet Transfer: Maximal assistance;+2 for physical assistance;+2 for safety/equipment;Stand-pivot(simulated to recliner) Toilet Transfer Details (indicate cue type and reason): pt required max A +2 for sit<>stand and pivot to recliner for safety and balance Toileting- Clothing Manipulation and Hygiene: Maximal assistance;+2 for physical assistance;+2 for safety/equipment;Sit to/from stand       Functional mobility during ADLs: Maximal assistance;+2 for physical assistance;+2 for safety/equipment;Cane General ADL Comments: Pt required min guard- min A for UB ADLs, and max A +2 for LB ADLs and functional  transfers.  Vision         Perception     Praxis      Pertinent Vitals/Pain Pain Assessment: No/denies pain Pain Intervention(s): Monitored during session     Hand Dominance Right   Extremity/Trunk Assessment Upper Extremity Assessment Upper Extremity Assessment: LUE deficits/detail;Generalized weakness LUE Deficits / Details: Pt has decreased AROM and flexion synergy pattern from prior CVA LUE Coordination: decreased fine motor;decreased gross motor   Lower Extremity Assessment Lower Extremity Assessment: Defer to PT evaluation RLE Deficits / Details: generally weak LLE Deficits / Details: L hemiparesis LLE Sensation: decreased proprioception LLE Coordination: decreased gross motor;decreased fine motor   Cervical / Trunk Assessment Cervical / Trunk Assessment: Kyphotic   Communication Communication Communication: No difficulties   Cognition Arousal/Alertness: Awake/alert Behavior During Therapy: Anxious(Pt slightly anxious about transferring to chair) Overall Cognitive Status: Impaired/Different from baseline Area of Impairment: Attention;Memory;Following commands;Safety/judgement;Awareness;Problem solving                   Current Attention Level: Sustained Memory: Decreased short-term memory Following Commands: Follows one step commands inconsistently;Follows one step commands with increased time Safety/Judgement: Decreased awareness of safety;Decreased awareness of deficits Awareness: Emergent Problem Solving: Slow processing;Decreased initiation;Difficulty sequencing;Requires verbal cues General Comments: Pt very anxious regarding transfer to chair, reporting that he does not want to fall. Pt benefitted from encouragement to participate in session. Pt had poor STM as seen by inability to recall taking medication ~10 minutes later. Pt very distractable, and required increased cues to follow simple commands.    General Comments  VSS throughout. BP  supine 133/65, sitting EOB 127/84, sitting in recliner after transfer 101/47. HR in 100s throughout.    Exercises     Shoulder Instructions      Home Living Family/patient expects to be discharged to:: Private residence Living Arrangements: Spouse/significant other Available Help at Discharge: Family;Available 24 hours/day Type of Home: Apartment Home Access: Level entry     Home Layout: One level     Bathroom Shower/Tub: Teacher, early years/pre: Standard Bathroom Accessibility: Yes   Home Equipment: Cane - single point;Tub bench;Grab bars - tub/shower;Toilet riser;Wheelchair - manual          Prior Functioning/Environment Level of Independence: Needs assistance  Gait / Transfers Assistance Needed: can walk with cane ADL's / Homemaking Assistance Needed: wife helps with all ADLs and IADLs   Comments: pts wife assists with all ADLs, IADLs. pt able to walk with [facility]        OT Problem List: Decreased strength;Decreased range of motion;Decreased activity tolerance;Impaired balance (sitting and/or standing);Decreased cognition;Decreased safety awareness;Decreased knowledge of use of DME or AE;Decreased knowledge of precautions;Pain;Impaired UE functional use;Increased edema      OT Treatment/Interventions: Self-care/ADL training;DME and/or AE instruction;Therapeutic activities;Balance training;Patient/family education    OT Goals(Current goals can be found in the care plan section) Acute Rehab OT Goals Patient Stated Goal: feel better OT Goal Formulation: With patient Time For Goal Achievement: 08/29/19 Potential to Achieve Goals: Good  OT Frequency: Min 2X/week   Barriers to D/C:            Co-evaluation              AM-PAC OT "6 Clicks" Daily Activity     Outcome Measure Help from another person eating meals?: None Help from another person taking care of personal grooming?: A Little Help from another person toileting, which includes using  toliet, bedpan, or urinal?: A Lot Help from another person bathing (including  washing, rinsing, drying)?: A Lot Help from another person to put on and taking off regular upper body clothing?: A Lot Help from another person to put on and taking off regular lower body clothing?: A Lot 6 Click Score: 15   End of Session Equipment Utilized During Treatment: Gait belt;Rolling walker Nurse Communication: Mobility status  Activity Tolerance: Patient tolerated treatment well Patient left: in chair;with call bell/phone within reach;with chair alarm set;with family/visitor present  OT Visit Diagnosis: Unsteadiness on feet (R26.81);Other abnormalities of gait and mobility (R26.89);Muscle weakness (generalized) (M62.81);Other symptoms and signs involving cognitive function;Hemiplegia and hemiparesis Hemiplegia - Right/Left: Left Hemiplegia - dominant/non-dominant: Non-Dominant Hemiplegia - caused by: Cerebral infarction                Time: [id number] OT Time Calculation (min): 42 min Charges:  OT General Charges $OT Visit: 1 Visit OT Evaluation $OT Eval Moderate Complexity: 1 Mod OT Treatments $Self Care/Home Management : 23-37 mins  Gus Rankin, OT Student  Gus Rankin 08/15/2019, 4:35 PM

## 2019-08-16 DIAGNOSIS — N3001 Acute cystitis with hematuria: Secondary | ICD-10-CM | POA: Diagnosis not present

## 2019-08-16 NOTE — TOC Initial Note (Signed)
Transition of Care [facility]) - Initial/Assessment Note    Patient Details  Name: Robert [name] MRN: [id number] Date of Birth: [birth year]  Transition of Care [facility]) CM/SW Contact:    Alexander Mt, [place] Phone Number: 08/16/2019, 12:08 PM  Clinical Narrative:                 CSW spoke with pt at bedside, introduced self, role, reason for visit. Confirmed home address and PCP information. Pt from home with his wife; he is originally from [place], [place]. He has a [employer] wheelchair" at home and a hospital bed. He has a chronic home catheter. His wife still works per his report- "she's on the phone all day for it." We discussed recommendations and that CIR had visited pt. He prefers to speak with CIR and his wife later today and then go from there with care planning.   CSW will revisit pt should continued care planning be necessary.   Expected Discharge Plan: IP Rehab Facility(vs SNF vs HH) Barriers to Discharge: Continued Medical Work up   Patient Goals and CMS Choice Patient states their goals for this hospitalization and ongoing recovery are:: to be able to get some therapy CMS Medicare.gov Compare Post Acute Care list provided to:: Patient(pending CIR vs SNF) Choice offered to / list presented to : Patient, Spouse  Expected Discharge Plan and Services Expected Discharge Plan: IP Rehab Facility(vs SNF vs [facility]) In-house Referral: Clinical Social Work Discharge Planning Services: CM Consult Post Acute Care Choice: IP Rehab Living arrangements for the past 2 months: Apartment    Prior Living Arrangements/Services Living arrangements for the past 2 months: Apartment Lives with:: Spouse Patient language and need for interpreter reviewed:: Yes(no needs) Do you feel safe going back to the place where you live?: Yes      Need for Family Participation in Patient Care: Yes (Comment)(assistance/supervision) Care giver support system in place?: Yes (comment)(spouse) Current home  services: DME Criminal Activity/Legal Involvement Pertinent to Current Situation/Hospitalization: No - Comment as needed  Activities of Daily Living      Permission Sought/Granted Permission sought to share information with : Family Supports Permission granted to share information with : Yes, Verbal Permission Granted  Share Information with NAME: Robert [name]     Permission granted to share info w Relationship: wife  Permission granted to share info w Contact Information: [phone]  Emotional Assessment Appearance:: Appears stated age Attitude/Demeanor/Rapport: Engaged, Gracious Affect (typically observed): Appropriate, Accepting, Adaptable Orientation: : Oriented to Self, Oriented to Place, Oriented to  Time, Oriented to Situation Alcohol / Substance Use: Not Applicable Psych Involvement: No (comment)  Admission diagnosis:  Urinary retention [R33.9] Acute cystitis with hematuria [N30.01] Orthostatic hypotension [I95.1] Patient Active Problem List   Diagnosis Date Noted  . Acute deep vein thrombosis (DVT) of femoral vein of left lower extremity ([place])   . Urinary retention 08/14/2019  . History of CVA with residual deficit   . BPH (benign prostatic hyperplasia)   . Orthostatic hypotension 08/13/2019  . Hyperlipidemia   . Hemiparesis affecting left side as late effect of cerebrovascular accident ([place]) 12/06/2017  . Gait abnormality 11/22/2017  . UTI (urinary tract infection) 04/22/2017  . Acute lower UTI 04/22/2017  . Tremor 04/22/2017  . Acute pyelonephritis 09/29/2012  . Nausea 09/29/2012  . Fever 09/29/2012  . HTN (hypertension) 09/29/2012  . H/O: CVA (cerebrovascular accident) 09/29/2012  . Hearing loss 09/29/2012   PCP:  [name], MD Pharmacy:   [facility] # [phone] -  [place], [place] [address] Phone: [phone] Fax: [phone]     Social Determinants of Health (SDOH) Interventions     Readmission Risk Interventions Readmission Risk Prevention Plan 08/16/2019  Post Dischage Appt Not Complete  Appt Comments disposition tbd  Medication Screening Complete  Transportation Screening Complete  Some recent data might be hidden

## 2019-08-16 NOTE — Progress Notes (Addendum)
PROGRESS NOTE    Robert [name]  [id number] DOB: [birth year] DOA: 08/13/2019 PCP: [name], MD    Brief Narrative:  73 year old male with history of Parkinson's disease, large CVA with left hemiparesis (8 yrs ago) and subsequent DVT, IVC filter, history of urethral stricture was brought to the emergency room with 1 to 2 days of weakness. He was clammy and lightheaded. EMS was called. Noted to have hypotension with SBP in 70s & dark urine. Noted to have urinary retention with PVR of 348 ccin ED. Foley not able to be placed by nursing staff and thus urology consulted who placed a coude cath. Also noted to have a UTI with hematuria.   He was noted to have pitting edema bilaterally and a LE venous duplex was ordered due to h/o prior DVT. Troponin 156. Lactic acid 2.0. Cr elevated from baseline at 1.34 and mildly acidotic with CO2 21.   Assessment & Plan:   Principal Problem:   Orthostatic hypotension Active Problems:   HTN (hypertension)   Hearing loss   UTI (urinary tract infection)   Hemiparesis affecting left side as late effect of cerebrovascular accident ([place])   Hyperlipidemia   Urinary retention   BPH (benign prostatic hyperplasia)   Acute deep vein thrombosis (DVT) of femoral vein of left lower extremity ([facility])   Principal Problem:   Orthostatic hypotension - HR 100 sitting > 144 with standing - Suspected dehydration - HR currently stable. Recent orthostatic vitals without orthostatic hypotension  Active Problems: Complicated UTI with urinary retention and acute encephalopathy Severe Sepsis with tachycardia, hypotension and elevated LA -  h/o urethral stricture and dilatation in the past by Dr Risa Grill - has h/o lower urinary tract symptoms- frequency/ urgency - now with Coude cath placed by Urology, Dr Noah Delaine - will need to f/u with Urology in 7-10 days for voiding trial - Urology holding off on alpha blocker for now as he was orthostatic - blood cultures  NGTD -cont Ceftriaxone- culture growing > 100,000 strep species -Seems pleasant, conversant while eating breakfast this AM  Elevated Cr - Renal ultrasound on 10/19> Mild bilateral renal cortical thinning  - baseline Cr 0.9- Cr 1.34 on admission- has improved- likely due to BOO and possibly dehydration - Cr overall improved, will repeat bmet in AM  Elevated Troponin - likely due to hypotension- as trending down and pt remains asymptomatic, will not repeat   Acute Left DVT - 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 Not candidate for CIR given lack of caregiver assist     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- h/o cerebrovascular accident  - on daily aspirin- will hold as pt is now on Eliquis - uses a Cane at baseline -Therapy recs for SNF    Hyperlipidemia - Continued on statin as tolerated  Parkinson's disease - cont Sinemet and Nuplazid- he follows with neurology    HTN (hypertension) - Losartan held in setting of AKI- -BP stable at this time -Repeat bmet in AM     Hearing loss  DVT prophylaxis: Eliquis Code Status: Full Family  Communication: Pt in room, family not at bedside Disposition Plan: Uncertain at this time  Consultants:   Urology  CIR  Procedures:     Antimicrobials: Anti-infectives (From admission, onward)   Start     Dose/Rate Route Frequency Ordered Stop   08/15/19 1800  cefTRIAXone (ROCEPHIN) 1 g in sodium chloride 0.9 % 100 mL IVPB  Status:  Discontinued     1 g 200 mL/hr over 30 Minutes Intravenous Every 24 hours 08/14/19 1354 08/14/19  1355   08/14/19 1800  cefTRIAXone (ROCEPHIN) 1 g in sodium chloride 0.9 % 100 mL IVPB     1 g 200 mL/hr over 30 Minutes Intravenous Every 24 hours 08/14/19 1355     08/14/19 1200  cefTRIAXone (ROCEPHIN) 1 g in sodium chloride 0.9 % 100 mL IVPB  Status:  Discontinued     1 g 200 mL/hr over 30 Minutes Intravenous Every 24 hours 08/13/19 2359 08/14/19 1354   08/13/19 1745  cefTRIAXone (ROCEPHIN) 1 g in sodium chloride 0.9 % 100 mL IVPB     1 g 200 mL/hr over 30 Minutes Intravenous  Once 08/13/19 1732 08/13/19 1902       Subjective: Without complaints this AM  Objective: Vitals:   08/16/19 0500 08/16/19 0506 08/16/19 0900 08/16/19 1343  BP:  131/82 135/66 128/60  Pulse:  72 79 83  Resp:  19 20 20   Temp:  98.2 F (36.8 C) 98.9 F (37.2 C) 99.3 F (37.4 C)  TempSrc:  Axillary Oral Oral  SpO2:  99% 95% 93%  Weight: 112.5 kg     Height:        Intake/Output Summary (Last 24 hours) at 08/16/2019 1619 Last data filed at 08/16/2019 0900 Gross per 24 hour  Intake 400 ml  Output 650 ml  Net -250 ml   Filed Weights   08/13/19 1402 08/14/19 1631 08/16/19 0500  Weight: 104.3 kg 109.6 kg 112.5 kg    Examination:  General exam: Appears calm and comfortable  Respiratory system: Clear to auscultation. Respiratory effort normal. Cardiovascular system: S1 & S2 heard, RRR Gastrointestinal system: Abdomen is nondistended, soft and nontender. No organomegaly or masses felt. Normal bowel sounds heard. Central nervous system: Alert. No focal neurological deficits. Extremities: Symmetric 5 x 5 power. Skin: No rashes, lesions Psychiatry: Judgement and insight appear normal. Mood & affect appropriate.   Data Reviewed: I have personally reviewed following labs and imaging studies  CBC: Recent Labs  Lab 08/13/19 1604 08/14/19 0406  WBC 9.1 9.6  NEUTROABS 7.6  --   HGB 15.8 15.0  HCT 49.1 46.6  MCV 92.8 91.4  PLT 249 [id number]   Basic Metabolic Panel: Recent Labs  Lab 08/13/19 1604  08/14/19 0406 08/15/19 0237  NA 138 139 138  K 4.2 3.8 3.7  CL 103 107 107  CO2 21* 22 22  GLUCOSE 211* 131* 138*  BUN 16 14 14   CREATININE 1.34* 0.96 1.07  CALCIUM 9.2 8.8* 8.9   GFR: Estimated Creatinine Clearance: 83.2 mL/min (by C-G formula based on SCr of 1.07 mg/dL). Liver Function Tests: Recent Labs  Lab 08/13/19 1604  AST 25  ALT 9  ALKPHOS 69  BILITOT 1.3*  PROT 7.1  ALBUMIN 3.8   No results for input(s): LIPASE, AMYLASE in the last 168 hours. No results for input(s): AMMONIA in the last 168 hours. Coagulation Profile: No results for input(s): INR, PROTIME in the last 168 hours. Cardiac Enzymes: No results for input(s): CKTOTAL, CKMB, CKMBINDEX, TROPONINI in the  last 168 hours. BNP (last 3 results) No results for input(s): PROBNP in the last 8760 hours. HbA1C: No results for input(s): HGBA1C in the last 72 hours. CBG: Recent Labs  Lab 08/14/19 0753  GLUCAP 132*   Lipid Profile: No results for input(s): CHOL, HDL, LDLCALC, TRIG, CHOLHDL, LDLDIRECT in the last 72 hours. Thyroid Function Tests: No results for input(s): TSH, T4TOTAL, FREET4, T3FREE, THYROIDAB in the last 72 hours. Anemia Panel: No results for input(s): VITAMINB12, FOLATE, FERRITIN, TIBC, IRON, RETICCTPCT in the last 72 hours. Sepsis Labs: Recent Labs  Lab 08/13/19 1757 08/13/19 2122  LATICACIDVEN 2.0* 1.3    Recent Results (from the past 240 hour(s))  Urine culture     Status: Abnormal   Collection Time: 08/13/19  4:13 PM   Specimen: Urine, Clean Catch  Result Value Ref Range Status   Specimen Description URINE, CLEAN CATCH  Final   Special Requests   Final    NONE Performed at [facility], [place] [address] [place], [place] [address]    Culture >=100,000 COLONIES/mL STREPTOCOCCUS SPECIES (A)  Final   Report Status 08/15/2019 FINAL  Final  Culture, blood (routine x 2)     Status: None (Preliminary result)   Collection Time: 08/13/19  5:52 PM   Specimen: BLOOD  Result  Value Ref Range Status   Specimen Description BLOOD RIGHT ANTECUBITAL  Final   Special Requests   Final    BOTTLES DRAWN AEROBIC AND ANAEROBIC Blood Culture results may not be optimal due to an inadequate volume of blood received in culture bottles   Culture   Final    NO GROWTH 3 DAYS Performed at [facility], [place] [address], [place], [place] [address]    Report Status PENDING  Incomplete  Culture, blood (routine x 2)     Status: None (Preliminary result)   Collection Time: 08/13/19  5:53 PM   Specimen: BLOOD  Result Value Ref Range Status   Specimen Description BLOOD LEFT ANTECUBITAL  Final   Special Requests   Final    BOTTLES DRAWN AEROBIC AND ANAEROBIC Blood Culture adequate volume   Culture   Final    NO GROWTH 3 DAYS Performed at [facility], [place] [address] [place], [place] [address]    Report Status PENDING  Incomplete  SARS CORONAVIRUS 2 (TAT 6-24 HRS) Nasopharyngeal Nasopharyngeal Swab     Status: None   Collection Time: 08/14/19  5:37 AM   Specimen: Nasopharyngeal Swab  Result Value Ref Range Status   SARS Coronavirus 2 NEGATIVE NEGATIVE Final    Comment: (NOTE) SARS-CoV-2 target nucleic acids are NOT DETECTED. The SARS-CoV-2 RNA is generally detectable in upper and lower respiratory specimens during the acute phase of infection. Negative results do not preclude SARS-CoV-2 infection, do not rule out co-infections with other pathogens, and should not be used as the sole basis for treatment or other patient management decisions. Negative results must be combined with clinical observations, patient history, and epidemiological information. The expected result is Negative. Fact Sheet for Patients: SugarRoll.be Fact Sheet for Healthcare Providers: https://www.woods-mathews.com/ This test is not yet approved or cleared by the [place] FDA and  has been authorized for detection and/or diagnosis of SARS-CoV-2  by FDA under an Emergency Use Authorization (EUA). This EUA will remain  in effect (meaning this test can be used) for the duration of the COVID-19 declaration under Section 56 4(b)(1) of the Act, 21 U.S.C. section 360bbb-3(b)(1), unless the authorization is  terminated or revoked sooner. Performed at [facility], [place] [address] [place], [place] [address]      Radiology Studies: Vas [place] Lower Extremity Venous (dvt)  Result Date: 08/15/2019  Lower Venous Study Indications: Swelling.  Limitations: Body habitus, poor ultrasound/tissue interface and overlying bowel gas. Comparison Study: No prior study. Performing Technologist: Maudry Mayhew MHA, RDMS, RVT, RDCS  Examination Guidelines: A complete evaluation includes B-mode imaging, spectral Doppler, color Doppler, and power Doppler as needed of all accessible portions of each vessel. Bilateral testing is considered an integral part of a complete examination. Limited examinations for reoccurring indications may be performed as noted.  +---------+---------------+---------+-----------+----------+--------------+  RIGHT     Compressibility Phasicity Spontaneity Properties Thrombus Aging  +---------+---------------+---------+-----------+----------+--------------+  CFV       Full            Yes       Yes                                    +---------+---------------+---------+-----------+----------+--------------+  SFJ       Full                                                             +---------+---------------+---------+-----------+----------+--------------+  FV Prox   Full                                                             +---------+---------------+---------+-----------+----------+--------------+  FV Mid    Full                                                             +---------+---------------+---------+-----------+----------+--------------+  FV Distal Full                                                              +---------+---------------+---------+-----------+----------+--------------+  PFV       Full                                                             +---------+---------------+---------+-----------+----------+--------------+  POP       Full            Yes       Yes                                    +---------+---------------+---------+-----------+----------+--------------+  PTV  Full                                                             +---------+---------------+---------+-----------+----------+--------------+  PERO      Full                                                             +---------+---------------+---------+-----------+----------+--------------+  CIV                                 Yes                                    +---------+---------------+---------+-----------+----------+--------------+  +---------+---------------+---------+-----------+----------+--------------+  LEFT      Compressibility Phasicity Spontaneity Properties Thrombus Aging  +---------+---------------+---------+-----------+----------+--------------+  CFV       None                      No                     Acute           +---------+---------------+---------+-----------+----------+--------------+  SFJ       None                                             Acute           +---------+---------------+---------+-----------+----------+--------------+  FV Prox   None                                             Acute           +---------+---------------+---------+-----------+----------+--------------+  FV Mid    None                                             Acute           +---------+---------------+---------+-----------+----------+--------------+  FV Distal None                                             Acute           +---------+---------------+---------+-----------+----------+--------------+  PFV       None                                             Acute            +---------+---------------+---------+-----------+----------+--------------+  POP  None                      No                     Acute           +---------+---------------+---------+-----------+----------+--------------+  EIV                                 Yes                                    +---------+---------------+---------+-----------+----------+--------------+  CIV                                 Yes                                    +---------+---------------+---------+-----------+----------+--------------+ Unable to visualize left posterior tibial and peroneal veins. Unable to visualize IVC.  Summary: Right: There is no evidence of deep vein thrombosis in the lower extremity. No cystic structure found in the popliteal fossa. Left: Findings consistent with acute deep vein thrombosis involving the left common femoral vein, left femoral vein, left proximal profunda vein, and left popliteal vein. No cystic structure found in the popliteal fossa.  *See table(s) above for measurements and observations. Electronically signed by Ruta Hinds MD on 08/15/2019 at 11:12:30 AM.    Final     Scheduled Meds:  apixaban  10 mg Oral BID   Followed by   Derrill Memo ON 08/22/2019] apixaban  5 mg Oral BID   carbidopa-levodopa  1 tablet Oral BID WC   And   Carbidopa-Levodopa ER  1 tablet Oral QHS   Chlorhexidine Gluconate Cloth  6 each Topical Daily   Pimavanserin Tartrate  34 mg Oral Daily   simvastatin  20 mg Oral QPM   Continuous Infusions:  cefTRIAXone (ROCEPHIN)  IV 1 g (08/15/19 2038)     LOS: 2 days   Marylu Lund, MD [facility] Pager On Amion  If 7PM-7AM, please contact night-coverage 08/16/2019, 4:19 PM

## 2019-08-16 NOTE — Progress Notes (Signed)
Inpatient Rehab Admissions:  Inpatient Rehab Consult received.  I met with pt at the bedside for rehabilitation assessment. Pt able to give me a good description of how he was managing at home PTA and what he needed assist with at that time. Pt is interested in therapy to help restore his strength get him home safely. He is requesting I return once his wife is here around 2pm to further discuss the program. I left him with CIR brochures and will follow up once his wife is present.   Jhonnie Garner, OTR/L  Rehab Admissions Coordinator  [phone] 08/16/2019 11:29 AM

## 2019-08-16 NOTE — Plan of Care (Signed)

## 2019-08-16 NOTE — Patient Care Conference (Signed)
Called and updated patient's spouse. All questions were answered. Will follow

## 2019-08-16 NOTE — Progress Notes (Signed)
Inpatient Rehabilitation-Admissions Coordinator   Met with pt and his wife in his room. Per wife she works 3 part time days and two full time days and he would need to be able to ambulate independently to the bathroom when she was not there. At this time, feel pt will need a longer rehab program to allow him to achieve that level of independence to safely return home. Due to lack of caregiver assist, will not pursue CIR at this time. Pt and wife aware and wife in agreement for need of longer rehab stent.   I will sign off and have made SW aware.   Please call if questions.   Jhonnie Garner, OTR/L  Rehab Admissions Coordinator  [phone] 08/16/2019 2:46 PM

## 2019-08-17 DIAGNOSIS — I159 Secondary hypertension, unspecified: Secondary | ICD-10-CM

## 2019-08-17 DIAGNOSIS — I82412 Acute embolism and thrombosis of left femoral vein: Secondary | ICD-10-CM | POA: Diagnosis not present

## 2019-08-17 DIAGNOSIS — I69354 Hemiplegia and hemiparesis following cerebral infarction affecting left non-dominant side: Secondary | ICD-10-CM | POA: Diagnosis not present

## 2019-08-17 LAB — CBC
HCT: 40.6 % (ref 39.0–52.0)
Hemoglobin: 13.5 g/dL (ref 13.0–17.0)
MCH: 29.7 pg (ref 26.0–34.0)
MCHC: 33.3 g/dL (ref 30.0–36.0)
MCV: 89.2 fL (ref 80.0–100.0)
Platelets: 196 10*3/uL (ref 150–400)
RBC: 4.55 MIL/uL (ref 4.22–5.81)
RDW: 13.4 % (ref 11.5–15.5)
WBC: 8.3 10*3/uL (ref 4.0–10.5)
nRBC: 0 % (ref 0.0–0.2)

## 2019-08-17 LAB — COMPREHENSIVE METABOLIC PANEL
ALT: 6 U/L (ref 0–44)
AST: 14 U/L — ABNORMAL LOW (ref 15–41)
Albumin: 2.9 g/dL — ABNORMAL LOW (ref 3.5–5.0)
Alkaline Phosphatase: 52 U/L (ref 38–126)
Anion gap: 9 (ref 5–15)
BUN: 11 mg/dL (ref 8–23)
CO2: 24 mmol/L (ref 22–32)
Calcium: 8.6 mg/dL — ABNORMAL LOW (ref 8.9–10.3)
Chloride: 105 mmol/L (ref 98–111)
Creatinine, Ser: 0.86 mg/dL (ref 0.61–1.24)
GFR calc Af Amer: >60 mL/min (ref >60)
GFR calc non Af Amer: >60 mL/min (ref >60)
Glucose, Bld: 129 mg/dL — ABNORMAL HIGH (ref 70–99)
Potassium: 3.5 mmol/L (ref 3.5–5.1)
Sodium: 138 mmol/L (ref 135–145)
Total Bilirubin: 1.1 mg/dL (ref 0.3–1.2)
Total Protein: 6.3 g/dL — ABNORMAL LOW (ref 6.5–8.1)

## 2019-08-17 NOTE — Progress Notes (Signed)
PROGRESS NOTE    Robert [name]  [id number] DOB: 1946-08-31 DOA: 08/13/2019 PCP: [name], MD    Brief Narrative:  73 year old male with history of Parkinson's disease, large CVA with left hemiparesis (8 yrs ago) and subsequent DVT, IVC filter, history of urethral stricture was brought to the emergency room with 1 to 2 days of weakness. He was clammy and lightheaded. EMS was called. Noted to have hypotension with SBP in 70s & dark urine. Noted to have urinary retention with PVR of 348 ccin ED. Foley not able to be placed by nursing staff and thus urology consulted who placed a coude cath. Also noted to have a UTI with hematuria.   He was noted to have pitting edema bilaterally and a LE venous duplex was ordered due to h/o prior DVT. Troponin 156. Lactic acid 2.0. Cr elevated from baseline at 1.34 and mildly acidotic with CO2 21.   Assessment & Plan:   Principal Problem:   Orthostatic hypotension Active Problems:   HTN (hypertension)   Hearing loss   UTI (urinary tract infection)   Hemiparesis affecting left side as late effect of cerebrovascular accident ([place])   Hyperlipidemia   Urinary retention   BPH (benign prostatic hyperplasia)   Acute deep vein thrombosis (DVT) of femoral vein of left lower extremity (HCC)   Principal Problem:   Orthostatic hypotension - Suspected dehydration - HR currently stable. Recent orthostatic vitals without orthostatic hypotension -Seems stable at this time  Active Problems: Complicated UTI with urinary retention and acute encephalopathy Severe Sepsis with tachycardia, hypotension and elevated LA -  h/o urethral stricture and dilatation in the past by Dr Risa Grill - has h/o lower urinary tract symptoms- frequency/ urgency - now with Coude cath placed by Urology, Dr Noah Delaine - will need to f/u with Urology in 7-10 days for voiding trial - Urology holding off on alpha blocker for now as he was orthostatic - blood cultures NGTD - Will  continue on Ceftriaxone- culture growing > 100,000 strep species - Seems pleasant, conversant while eating breakfast this AM  Elevated Cr - Renal ultrasound on 10/19> Mild bilateral renal cortical thinning  - baseline Cr 0.9- Cr 1.34 on admission- has improved- likely due to BOO and possibly dehydration - Cr has normalized. 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 Not candidate for CIR given lack of caregiver assist. 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- h/o cerebrovascular accident  - on daily aspirin- will hold as pt is now on Eliquis - uses a [place] at baseline -Therapy recs for SNF, pending placement    Hyperlipidemia - Continued on statin as tolerated  Parkinson's disease - cont Sinemet and Nuplazid - he follows with neurology    HTN (hypertension) - Losartan held in setting of AKI -BP stable at this time -Repeat BMET in AM     Hearing loss  DVT prophylaxis:  Eliquis Code Status: Full Family Communication: Pt in room, family not at bedside Disposition Plan: Uncertain at this time  Consultants:   Urology  CIR  Procedures:     Antimicrobials: Anti-infectives (From admission, onward)   Start     Dose/Rate Route Frequency Ordered Stop   08/15/19 1800  cefTRIAXone (ROCEPHIN) 1 g in sodium chloride 0.9 % 100 mL IVPB  Status:  Discontinued     1 g 200 mL/hr over 30 Minutes Intravenous  Every 24 hours 08/14/19 1354 08/14/19 1355   08/14/19 1800  cefTRIAXone (ROCEPHIN) 1 g in sodium chloride 0.9 % 100 mL IVPB     1 g 200 mL/hr over 30 Minutes Intravenous Every 24 hours 08/14/19 1355     08/14/19 1200  cefTRIAXone (ROCEPHIN) 1 g in sodium chloride 0.9 % 100 mL IVPB  Status:  Discontinued     1 g 200 mL/hr over 30 Minutes Intravenous Every 24 hours 08/13/19 2359 08/14/19 1354   08/13/19 1745  cefTRIAXone (ROCEPHIN) 1 g in sodium chloride 0.9 % 100 mL IVPB     1 g 200 mL/hr over 30 Minutes Intravenous  Once 08/13/19 1732 08/13/19 1902      Subjective: No complaints this AM. Eager to go home  Objective: Vitals:   08/16/19 2117 08/17/19 0500 08/17/19 0528 08/17/19 1552  BP: (!) 155/81  (!) 151/78 135/66  Pulse: 90  79 87  Resp: 16  16 18   Temp: 99.6 F (37.6 C)  98.8 F (37.1 C) 99.3 F (37.4 C)  TempSrc: Oral  Oral Oral  SpO2: 97%  96% 99%  Weight:  112.1 kg    Height:        Intake/Output Summary (Last 24 hours) at 08/17/2019 1717 Last data filed at 08/17/2019 0535 Gross per 24 hour  Intake 180 ml  Output 350 ml  Net -170 ml   Filed Weights   08/14/19 1631 08/16/19 0500 08/17/19 0500  Weight: 109.6 kg 112.5 kg 112.1 kg    Examination: General exam: Awake, laying in bed, in nad Respiratory system: Normal respiratory effort, no wheezing Cardiovascular system: regular rate, s1, s2 Gastrointestinal system: Soft, nondistended, positive BS Central nervous system: CN2-12 grossly intact, strength intact Extremities: Perfused, no clubbing Skin: Normal skin turgor, no notable skin lesions seen Psychiatry: Mood normal // no visual hallucinations   Data Reviewed: I have personally reviewed following labs and imaging studies  CBC: Recent Labs  Lab 08/13/19 1604 08/14/19 0406 08/17/19 0303  WBC 9.1 9.6 8.3  NEUTROABS 7.6  --   --   HGB 15.8 15.0 13.5  HCT 49.1 46.6 40.6  MCV 92.8 91.4 89.2  PLT 249 217 [id number]   Basic Metabolic Panel: Recent Labs   Lab 08/13/19 1604 08/14/19 0406 08/15/19 0237 08/17/19 0303  NA 138 139 138 138  K 4.2 3.8 3.7 3.5  CL 103 107 107 105  CO2 21* 22 22 24   GLUCOSE 211* 131* 138* 129*  BUN 16 14 14 11   CREATININE 1.34* 0.96 1.07 0.86  CALCIUM 9.2 8.8* 8.9 8.6*   GFR: Estimated Creatinine Clearance: 103.4 mL/min (by C-G formula based on SCr of 0.86 mg/dL). Liver Function Tests: Recent Labs  Lab 08/13/19 1604 08/17/19 0303  AST 25 14*  ALT 9 6  ALKPHOS 69 52  BILITOT 1.3* 1.1  PROT 7.1 6.3*  ALBUMIN 3.8 2.9*   No results for input(s): LIPASE, AMYLASE in the last 168 hours. No results for input(s): AMMONIA in the last 168 hours.  Coagulation Profile: No results for input(s): INR, PROTIME in the last 168 hours. Cardiac Enzymes: No results for input(s): CKTOTAL, CKMB, CKMBINDEX, TROPONINI in the last 168 hours. BNP (last 3 results) No results for input(s): PROBNP in the last 8760 hours. HbA1C: No results for input(s): HGBA1C in the last 72 hours. CBG: Recent Labs  Lab 08/14/19 0753  GLUCAP 132*   Lipid Profile: No results for input(s): CHOL, HDL, LDLCALC, TRIG, CHOLHDL, LDLDIRECT in the last 72 hours. Thyroid Function Tests: No results for input(s): TSH, T4TOTAL, FREET4, T3FREE, THYROIDAB in the last 72 hours. Anemia Panel: No results for input(s): VITAMINB12, FOLATE, FERRITIN, TIBC, IRON, RETICCTPCT in the last 72 hours. Sepsis Labs: Recent Labs  Lab 08/13/19 1757 08/13/19 2122  LATICACIDVEN 2.0* 1.3    Recent Results (from the past 240 hour(s))  Urine culture     Status: Abnormal   Collection Time: 08/13/19  4:13 PM   Specimen: Urine, Clean Catch  Result Value Ref Range Status   Specimen Description URINE, CLEAN CATCH  Final   Special Requests   Final    NONE Performed at [facility], [place] [address] [place], [place] [address]    Culture >=100,000 COLONIES/mL STREPTOCOCCUS SPECIES (A)  Final   Report Status 08/15/2019 FINAL  Final  Culture, blood (routine x 2)      Status: None (Preliminary result)   Collection Time: 08/13/19  5:52 PM   Specimen: BLOOD  Result Value Ref Range Status   Specimen Description BLOOD RIGHT ANTECUBITAL  Final   Special Requests   Final    BOTTLES DRAWN AEROBIC AND ANAEROBIC Blood Culture results may not be optimal due to an inadequate volume of blood received in culture bottles   Culture   Final    NO GROWTH 4 DAYS Performed at [facility], [place] [address] [place], [place] [address]    Report Status PENDING  Incomplete  Culture, blood (routine x 2)     Status: None (Preliminary result)   Collection Time: 08/13/19  5:53 PM   Specimen: BLOOD  Result Value Ref Range Status   Specimen Description BLOOD LEFT ANTECUBITAL  Final   Special Requests   Final    BOTTLES DRAWN AEROBIC AND ANAEROBIC Blood Culture adequate volume   Culture   Final    NO GROWTH 4 DAYS Performed at [facility], [place] [address], [place], [place] [address]    Report Status PENDING  Incomplete  SARS CORONAVIRUS 2 (TAT 6-24 HRS) Nasopharyngeal Nasopharyngeal Swab     Status: None   Collection Time: 08/14/19  5:37 AM   Specimen: Nasopharyngeal Swab  Result Value Ref Range Status   SARS Coronavirus 2 NEGATIVE NEGATIVE Final    Comment: (NOTE) SARS-CoV-2 target nucleic acids are NOT DETECTED. The SARS-CoV-2 RNA is generally detectable in upper and lower respiratory specimens during the acute phase of infection. Negative results do not preclude SARS-CoV-2 infection, do not rule out co-infections with other pathogens, and should not be used as the sole basis for treatment or other patient management decisions. Negative results must be combined with clinical observations, patient history, and epidemiological information. The expected result is Negative. Fact Sheet for Patients: SugarRoll.be Fact Sheet for Healthcare Providers: https://www.woods-mathews.com/ This test is not yet approved or  cleared by the [place] FDA and  has been authorized for detection and/or diagnosis of SARS-CoV-2 by FDA under an Emergency Use Authorization (EUA). This EUA will remain  in effect (meaning this test  can be used) for the duration of the COVID-19 declaration under Section 56 4(b)(1) of the Act, 21 U.S.C. section 360bbb-3(b)(1), unless the authorization is terminated or revoked sooner. Performed at [facility], [place] [address], [place], [place] [address]      Radiology Studies: No results found.  Scheduled Meds: . apixaban  10 mg Oral BID   Followed by  . [START ON 08/22/2019] apixaban  5 mg Oral BID  . carbidopa-levodopa  1 tablet Oral BID WC   And  . Carbidopa-Levodopa ER  1 tablet Oral QHS  . Chlorhexidine Gluconate Cloth  6 each Topical Daily  . Pimavanserin Tartrate  34 mg Oral Daily  . simvastatin  20 mg Oral QPM   Continuous Infusions: . cefTRIAXone (ROCEPHIN)  IV 1 g (08/16/19 1742)     LOS: 3 days   Marylu Lund, MD [facility] Pager On Amion  If 7PM-7AM, please contact night-coverage 08/17/2019, 5:17 PM

## 2019-08-17 NOTE — NC FL2 (Addendum)
[place] LEVEL OF CARE SCREENING TOOL     IDENTIFICATION  Patient Name: Moe Yonamine Birthdate: 1945/12/29 Sex: male Admission Date (Current Location): 08/13/2019  [facility] and [place] Number:  Herbalist and Address:  [facility] [place], [place] [address] [place], [place] [address]      Provider Number: [id number]  Attending Physician Name and Address:  Donne Hazel, MD  Relative Name and Phone Number:       Current Level of Care: Hospital Recommended Level of Care: [place] Prior Approval Number:    Date Approved/Denied:   PASRR Number: [id number]  Discharge Plan: SNF    Current Diagnoses: Patient Active Problem List   Diagnosis Date Noted  . Acute deep vein thrombosis (DVT) of femoral vein of left lower extremity ([place])   . Urinary retention 08/14/2019  . History of CVA with residual deficit   . BPH (benign prostatic hyperplasia)   . Orthostatic hypotension 08/13/2019  . Hyperlipidemia   . Hemiparesis affecting left side as late effect of cerebrovascular accident ([place]) 12/06/2017  . Gait abnormality 11/22/2017  . UTI (urinary tract infection) 04/22/2017  . Acute lower UTI 04/22/2017  . Tremor 04/22/2017  . Acute pyelonephritis 09/29/2012  . Nausea 09/29/2012  . Fever 09/29/2012  . HTN (hypertension) 09/29/2012  . H/O: CVA (cerebrovascular accident) 09/29/2012  . Hearing loss 09/29/2012    Orientation RESPIRATION BLADDER Height & Weight     Self, Time, Situation, Place  Normal Incontinent, Indwelling catheter(coude catheter) Weight: 247 lb 2.2 oz (112.1 kg) Height:  6\' 2"  (188 cm)  BEHAVIORAL SYMPTOMS/MOOD NEUROLOGICAL BOWEL NUTRITION STATUS      Continent Diet(see discharge summary)  AMBULATORY STATUS COMMUNICATION OF NEEDS Skin   Extensive Assist Verbally Normal                       Personal Care Assistance Level of Assistance  Bathing, Feeding, Dressing Bathing Assistance: Maximum  assistance Feeding assistance: Limited assistance Dressing Assistance: Maximum assistance     Functional Limitations Info  Sight, Hearing, Speech Sight Info: Adequate Hearing Info: Impaired Speech Info: Adequate    SPECIAL CARE FACTORS FREQUENCY  OT (By licensed OT), PT (By licensed PT)     PT Frequency: 5x week OT Frequency: 5x week            Contractures Contractures Info: Not present    Additional Factors Info  Code Status, Allergies, Psychotropic Code Status Info: Full Code Allergies Info: Propofol Psychotropic Info: Pimavanserin (Nuplazid) capule 34 mg - patient's own daily PO; carbidopa-levodopa (SINEMET CR) 50-200 MG per tablet controlled release 1 tablet with breakfast and lunch; Carbidopa-Levodopa ER (SINEMET CR) 25-100 MG tablet controlled release 1 tablet daily at bedtime         Current Medications (08/17/2019):  This is the current hospital active medication list Current Facility-Administered Medications  Medication Dose Route Frequency Provider Last Rate Last Dose  . acetaminophen (TYLENOL) tablet 650 mg  650 mg Oral Q6H PRN Lenore Cordia, MD       Or  . acetaminophen (TYLENOL) suppository 650 mg  650 mg Rectal Q6H PRN Lenore Cordia, MD      . apixaban (ELIQUIS) tablet 10 mg  10 mg Oral BID Skeet Simmer, RPH   10 mg at 08/17/19 1022   Followed by  . [START ON 08/22/2019] apixaban (ELIQUIS) tablet 5 mg  5 mg Oral BID Skeet Simmer, [facility]      .  carbidopa-levodopa (SINEMET CR) 50-200 MG per tablet controlled release 1 tablet  1 tablet Oral BID WC Domenic Polite, MD   1 tablet at 08/17/19 1022   And  . Carbidopa-Levodopa ER (SINEMET CR) 25-100 MG tablet controlled release 1 tablet  1 tablet Oral QHS Domenic Polite, MD   1 tablet at 08/16/19 2133  . cefTRIAXone (ROCEPHIN) 1 g in sodium chloride 0.9 % 100 mL IVPB  1 g Intravenous Q24H Sherren Kerns D, RPH 200 mL/hr at 08/16/19 1742 1 g at 08/16/19 1742  . Chlorhexidine Gluconate Cloth 2 % PADS 6 each  6  each Topical Daily Domenic Polite, MD   6 each at 08/17/19 1037  . ondansetron (ZOFRAN) tablet 4 mg  4 mg Oral Q6H PRN Lenore Cordia, MD       Or  . ondansetron (ZOFRAN) injection 4 mg  4 mg Intravenous Q6H PRN Lenore Cordia, MD      . Pimavanserin (Nuplazid) capule 34 mg - patient's own  34 mg Oral Daily Lenore Cordia, MD   34 mg at 08/17/19 1036  . simvastatin (ZOCOR) tablet 20 mg  20 mg Oral QPM Lenore Cordia, MD   20 mg at 08/16/19 1741     Discharge Medications: Please see discharge summary for a list of discharge medications.  Relevant Imaging Results:  Relevant Lab Results:   Additional Information [id number] [place], [place]

## 2019-08-17 NOTE — TOC Progression Note (Signed)
Transition of Care [facility]) - Progression Note    Patient Details  Name: Robert [name] MRN: [id number] Date of Birth: 05-Dec-1945  Transition of Care [facility]) CM/SW [place], [place] Phone Number: 08/17/2019, 12:36 PM  Clinical Narrative:    CSW met with pt wife. Introduced self, role, reason for visit. She understands CIR is not an option and is interested in SNF. She is worried about finding a placement where they are allowing some kind of visitation. CSW explained due to infection risks and current COVID numbers that many if not all have again limited this. She is agreeable to referral to [facility] with the preference for knowing what visitation abilities are at this time.   CSW will make referral and f/u with pt wife at her request via email [email or web address] with offers. Continue to follow, PASRR pending also due to hx of Parkinsons.    Expected Discharge Plan: IP Rehab Facility(vs SNF vs HH) Barriers to Discharge: Continued Medical Work up  Expected Discharge Plan and Services Expected Discharge Plan: IP Rehab Facility(vs SNF vs [facility]) In-house Referral: Clinical Social Work Discharge Planning Services: CM Consult Post Acute Care Choice: IP Rehab Living arrangements for the past 2 months: Apartment     Social Determinants of Health (SDOH) Interventions    Readmission Risk Interventions Readmission Risk Prevention Plan 08/16/2019  Post Dischage Appt Not Complete  Appt Comments disposition tbd  Medication Screening Complete  Transportation Screening Complete  Some recent data might be hidden

## 2019-08-17 NOTE — Social Work (Signed)
CSW called pt wife to discuss SNF placement.  Pt wife currently working requests to meet at 11:45am.  Tentatively scheduled to meet at that time. Will let pt wife know if another case need arises.   CSW continuing to follow for support with disposition when medically appropriate.  Westley Hummer, MSW, [place] Work [phone]

## 2019-08-18 DIAGNOSIS — I951 Orthostatic hypotension: Secondary | ICD-10-CM | POA: Diagnosis not present

## 2019-08-18 LAB — CBC
HCT: 41.7 % (ref 39.0–52.0)
Hemoglobin: 13.6 g/dL (ref 13.0–17.0)
MCH: 29.4 pg (ref 26.0–34.0)
MCHC: 32.6 g/dL (ref 30.0–36.0)
MCV: 90.3 fL (ref 80.0–100.0)
Platelets: 244 10*3/uL (ref 150–400)
RBC: 4.62 MIL/uL (ref 4.22–5.81)
RDW: 13.3 % (ref 11.5–15.5)
WBC: 8.4 10*3/uL (ref 4.0–10.5)
nRBC: 0 % (ref 0.0–0.2)

## 2019-08-18 LAB — BASIC METABOLIC PANEL
Anion gap: 9 (ref 5–15)
BUN: 10 mg/dL (ref 8–23)
CO2: 23 mmol/L (ref 22–32)
Calcium: 8.7 mg/dL — ABNORMAL LOW (ref 8.9–10.3)
Chloride: 104 mmol/L (ref 98–111)
Creatinine, Ser: 0.9 mg/dL (ref 0.61–1.24)
GFR calc Af Amer: >60 mL/min (ref >60)
GFR calc non Af Amer: >60 mL/min (ref >60)
Glucose, Bld: 178 mg/dL — ABNORMAL HIGH (ref 70–99)
Potassium: 3.7 mmol/L (ref 3.5–5.1)
Sodium: 136 mmol/L (ref 135–145)

## 2019-08-18 LAB — CULTURE, BLOOD (ROUTINE X 2)
Culture: NO GROWTH
Culture: NO GROWTH
Special Requests: ADEQUATE

## 2019-08-18 MED ORDER — SODIUM CHLORIDE 0.9 % IV BOLUS
500.0000 mL | Freq: Once | INTRAVENOUS | Status: AC
  Administered 2019-08-18: 500 mL via INTRAVENOUS

## 2019-08-18 NOTE — Progress Notes (Signed)
Physical Therapy Treatment Patient Details Name: Robert [name] MRN: [id number] DOB: 26-Sep-1946 Today's Date: 08/18/2019    History of Present Illness 73 yo male presenting with fatigue, difficulty urinating, hypotension in ED. Admitted for orthostatic hypotension and urinary retention. PMH parkinsons, CVA with L weakness and L drop foot, hx DVT, HTN, IVC filter, HOH    PT Comments    Patient received in bed, pleasant and willing to participate in therapy but continues to require heavy amounts of assistance at this time. Able to perform bed mobility with maxAx2, initially required maxA to maintian sitting balance at EOB due to posterior lean, faded to Min guard-MinA with extended time. Attempted stand-pivot to commode with maxAx2, then transferred back to bed due to safety concerns and knees buckling during transfer. Able to perform sit to stand with maxAx2 in stedy, then pivot to [facility] with totalA in stedy. Able to have productive BM, then [place] for sit to stand in stedy, totalA pivot over to chair where he was able to perform sit to stand with MinAx2 in stedy. He was positioned to comfort in chair with all needs met this morning, chair alarm active, RN aware of mobility and need for lift for back to bed. VSS during session.    Follow Up Recommendations  SNF;Supervision/Assistance - 24 hour     Equipment Recommendations  Other (comment)(defer)    Recommendations for Other Services       Precautions / Restrictions Precautions Precautions: Fall;Other (comment) Precaution Comments: watch vitals, old L hemiparesis and foot drop from prior CVA Restrictions Weight Bearing Restrictions: No    Mobility  Bed Mobility Overal bed mobility: Needs Assistance Bed Mobility: Supine to Sit     Supine to sit: Max assist;+2 for physical assistance     General bed mobility comments: MaxAx2 for LE and trunk management to come to EOB, initially MaxA to maintain upright, faded to Min  guard-MinA  Transfers Overall transfer level: Needs assistance Equipment used: 2 person hand held assist Transfers: Sit to/from [facility] Sit to Stand: Max assist;+2 physical assistance;Mod assist Stand pivot transfers: Total assist       General transfer comment: able to perform sit to stand with MaxAx2, attempted stand-pivot with maxAx2 but stopped due to safety concerns. Switched to stedy, able to perform sit to stand with maxAx2, then totalA for pivot to [facility] then recliner. Able to stand with MinA in stedy at [place].  Ambulation/Gait             General Gait Details: DNT- safety   Stairs             Wheelchair Mobility    Modified Rankin (Stroke Patients Only)       Balance Overall balance assessment: Needs assistance Sitting-balance support: Single extremity supported;Feet supported Sitting balance-Leahy Scale: Poor Sitting balance - Comments: posterior lean Postural control: Posterior lean Standing balance support: Single extremity supported;During functional activity Standing balance-Leahy Scale: Zero Standing balance comment: required max A +2 to maintain balance in static standing                            Cognition Arousal/Alertness: Awake/alert Behavior During Therapy: WFL for tasks assessed/performed Overall Cognitive Status: Impaired/Different from baseline Area of Impairment: Attention;Memory;Following commands;Safety/judgement;Awareness;Problem solving                   Current Attention Level: Sustained Memory: Decreased short-term memory Following Commands: Follows one step commands inconsistently;Follows one  step commands with increased time Safety/Judgement: Decreased awareness of safety;Decreased awareness of deficits Awareness: Emergent Problem Solving: Slow processing;Decreased initiation;Difficulty sequencing;Requires verbal cues General Comments: very distractable, heavy VC and TC, often states  "wait!" as he feels he is being pushed over by PT      Exercises      General Comments General comments (skin integrity, edema, etc.): VSS throughout session, BP WNL, HR 122 BPM at EOS but recovered with seated rest in chair      Pertinent Vitals/Pain Pain Assessment: Faces Pain Score: 0-No pain Faces Pain Scale: No hurt Pain Intervention(s): Limited activity within patient's tolerance;Monitored during session    Home Living                      Prior Function            PT Goals (current goals can now be found in the care plan section) Acute Rehab PT Goals Patient Stated Goal: feel better PT Goal Formulation: With patient Time For Goal Achievement: 08/29/19 Potential to Achieve Goals: Good Progress towards PT goals: Progressing toward goals    Frequency    Min 2X/week      PT Plan Current plan remains appropriate    Co-evaluation              AM-PAC PT "6 Clicks" Mobility   Outcome Measure  Help needed turning from your back to your side while in a flat bed without using bedrails?: A Lot Help needed moving from lying on your back to sitting on the side of a flat bed without using bedrails?: A Lot Help needed moving to and from a bed to a chair (including a wheelchair)?: A Lot Help needed standing up from a chair using your arms (e.g., wheelchair or bedside chair)?: A Lot Help needed to walk in hospital room?: Total Help needed climbing 3-5 steps with a railing? : Total 6 Click Score: 10    End of Session Equipment Utilized During Treatment: Gait belt Activity Tolerance: Patient limited by fatigue Patient left: in chair;with call bell/phone within reach;with chair alarm set Nurse Communication: Mobility status;Need for lift equipment PT Visit Diagnosis: Unsteadiness on feet (R26.81);Difficulty in walking, not elsewhere classified (R26.2);Other abnormalities of gait and mobility (R26.89);Hemiplegia and hemiparesis Hemiplegia - Right/Left:  Left Hemiplegia - dominant/non-dominant: Non-dominant Hemiplegia - caused by: Cerebral infarction     Time: 7001-7494 PT Time Calculation (min) (ACUTE ONLY): 53 min  Charges:  $Therapeutic Activity: 53-67 mins                     Deniece Ree PT, DPT, CBIS  Supplemental Physical Therapist [place]    Pager [phone] Acute Rehab Office [phone]

## 2019-08-18 NOTE — Progress Notes (Signed)
PROGRESS NOTE    Robert [name]  [id number] DOB: June 08, 1946 DOA: 08/13/2019 PCP: [name], MD    Brief Narrative:  73 year old male with history of Parkinson's disease, large CVA with left hemiparesis (8 yrs ago) and subsequent DVT, IVC filter, history of urethral stricture was brought to the emergency room with 1 to 2 days of weakness. He was clammy and lightheaded. EMS was called. Noted to have hypotension with SBP in 70s & dark urine. Noted to have urinary retention with PVR of 348 ccin ED. Foley not able to be placed by nursing staff and thus urology consulted who placed a coude cath. Also noted to have a UTI with hematuria.   He was noted to have pitting edema bilaterally and a LE venous duplex was ordered due to h/o prior DVT. Troponin 156. Lactic acid 2.0. Cr elevated from baseline at 1.34 and mildly acidotic with CO2 21.   Assessment & Plan:   Principal Problem:   Orthostatic hypotension Active Problems:   HTN (hypertension)   Hearing loss   UTI (urinary tract infection)   Hemiparesis affecting left side as late effect of cerebrovascular accident ([place])   Hyperlipidemia   Urinary retention   BPH (benign prostatic hyperplasia)   Acute deep vein thrombosis (DVT) of femoral vein of left lower extremity (HCC)   Principal Problem:   Orthostatic hypotension - Suspected dehydration - HR currently stable. Recent orthostatic vitals without orthostatic hypotension - Remains stable at this time  Active Problems: Complicated UTI with urinary retention and acute encephalopathy Severe Sepsis with tachycardia, hypotension and elevated LA -  h/o urethral stricture and dilatation in the past by Dr Risa Grill - has h/o lower urinary tract symptoms- frequency/ urgency - now with Coude cath placed by Urology, Dr Noah Delaine - will need to f/u with Urology in 7-10 days for voiding trial - Urology holding off on alpha blocker for now as he was orthostatic - blood cultures NGTD - Will  continue on Ceftriaxone- culture growing > 100,000 strep species - Remains pleasant, alert and oriented this AM  Elevated Cr - Renal ultrasound on 10/19> Mild bilateral renal cortical thinning  - baseline Cr 0.9- Cr 1.34 on admission- has improved- likely due to BOO and possibly dehydration - Cr has normalized. 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 Not candidate for CIR given lack of caregiver assist. Anticipate d/c to SNF when COVID screen returns neg     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- h/o cerebrovascular accident  - on daily aspirin- will hold as pt is now on Eliquis - uses a Cane at baseline -Therapy recs for SNF, placement is pending    Hyperlipidemia - Continued on statin as tolerated  Parkinson's disease - cont Sinemet and Nuplazid - Pt follows Neurology    HTN (hypertension) - Losartan held in setting of AKI -BP stable at this time     Hearing loss  DVT prophylaxis: Eliquis Code Status: Full Family Communication: Pt in room, family not at bedside Disposition Plan: Uncertain at this time  Consultants:   Urology  CIR  Procedures:     Antimicrobials: Anti-infectives (From admission, onward)   Start     Dose/Rate Route Frequency Ordered Stop   08/15/19 1800  cefTRIAXone (ROCEPHIN) 1 g in sodium chloride 0.9 % 100 mL IVPB  Status:  Discontinued     1 g 200 mL/hr over 30 Minutes Intravenous  Every 24 hours 08/14/19 1354 08/14/19 1355   08/14/19 1800  cefTRIAXone (ROCEPHIN) 1 g in sodium chloride 0.9 % 100 mL IVPB     1 g 200 mL/hr over 30 Minutes Intravenous Every 24 hours 08/14/19 1355     08/14/19 1200  cefTRIAXone (ROCEPHIN) 1 g in sodium chloride 0.9 % 100 mL IVPB  Status:  Discontinued     1 g 200 mL/hr over 30 Minutes Intravenous Every 24 hours 08/13/19 2359 08/14/19 1354   08/13/19 1745  cefTRIAXone (ROCEPHIN) 1 g in sodium chloride 0.9 % 100 mL IVPB     1 g 200 mL/hr over 30 Minutes Intravenous  Once 08/13/19 1732 08/13/19 1902      Subjective: Without complaints this AM. Eager to go to rehab  Objective: Vitals:   08/17/19 2148 08/18/19 0500 08/18/19 0505 08/18/19 1532  BP: (!) 155/89  134/79 138/74  Pulse: 85  97 83  Resp: 16  17 19   Temp: 99.7 F (37.6 C)  99 F (37.2 C) 100 F (37.8 C)  TempSrc: Oral  Oral Oral  SpO2: 97%  92% 98%  Weight:  110.9 kg    Height:        Intake/Output Summary (Last 24 hours) at 08/18/2019 1614 Last data filed at 08/18/2019 0600 Gross per 24 hour  Intake 420 ml  Output 1050 ml  Net -630 ml   Filed Weights   08/16/19 0500 08/17/19 0500 08/18/19 0500  Weight: 112.5 kg 112.1 kg 110.9 kg    Examination: General exam: Conversant, in no acute distress Respiratory system: normal chest rise, clear, no audible wheezing Cardiovascular system: regular rhythm, s1-s2 Gastrointestinal system: Nondistended, nontender, pos BS Central nervous system: No seizures, no tremors Extremities: No cyanosis, no joint deformities Skin: No rashes, no pallor Psychiatry: Affect normal // no auditory hallucinations   Data Reviewed: I have personally reviewed following labs and imaging studies  CBC: Recent Labs  Lab 08/13/19 1604 08/14/19 0406 08/17/19 0303 08/18/19 0415  WBC 9.1 9.6 8.3 8.4  NEUTROABS 7.6  --   --   --   HGB 15.8 15.0 13.5 13.6  HCT 49.1 46.6 40.6 41.7  MCV 92.8 91.4 89.2 90.3  PLT 249 217 196 [id number]   Basic  Metabolic Panel: Recent Labs  Lab 08/13/19 1604 08/14/19 0406 08/15/19 0237 08/17/19 0303 08/18/19 0415  NA 138 139 138 138 136  K 4.2 3.8 3.7 3.5 3.7  CL 103 107 107 105 104  CO2 21* 22 22 24 23   GLUCOSE 211* 131* 138* 129* 178*  BUN 16 14 14 11 10   CREATININE 1.34* 0.96 1.07 0.86 0.90  CALCIUM 9.2 8.8* 8.9 8.6* 8.7*   GFR: Estimated Creatinine Clearance: 98.3 mL/min (by C-G formula based on SCr of 0.9 mg/dL). Liver Function Tests: Recent Labs  Lab 08/13/19 1604 08/17/19 0303  AST 25 14*  ALT 9 6  ALKPHOS 69 52  BILITOT 1.3* 1.1  PROT 7.1 6.3*  ALBUMIN 3.8 2.9*   No results  for input(s): LIPASE, AMYLASE in the last 168 hours. No results for input(s): AMMONIA in the last 168 hours. Coagulation Profile: No results for input(s): INR, PROTIME in the last 168 hours. Cardiac Enzymes: No results for input(s): CKTOTAL, CKMB, CKMBINDEX, TROPONINI in the last 168 hours. BNP (last 3 results) No results for input(s): PROBNP in the last 8760 hours. HbA1C: No results for input(s): HGBA1C in the last 72 hours. CBG: Recent Labs  Lab 08/14/19 0753  GLUCAP 132*   Lipid Profile: No results for input(s): CHOL, HDL, LDLCALC, TRIG, CHOLHDL, LDLDIRECT in the last 72 hours. Thyroid Function Tests: No results for input(s): TSH, T4TOTAL, FREET4, T3FREE, THYROIDAB in the last 72 hours. Anemia Panel: No results for input(s): VITAMINB12, FOLATE, FERRITIN, TIBC, IRON, RETICCTPCT in the last 72 hours. Sepsis Labs: Recent Labs  Lab 08/13/19 1757 08/13/19 2122  LATICACIDVEN 2.0* 1.3    Recent Results (from the past 240 hour(s))  Urine culture     Status: Abnormal   Collection Time: 08/13/19  4:13 PM   Specimen: Urine, Clean Catch  Result Value Ref Range Status   Specimen Description URINE, CLEAN CATCH  Final   Special Requests   Final    NONE Performed at [facility], [place] [address] [place], [place] [address]    Culture >=100,000 COLONIES/mL STREPTOCOCCUS SPECIES (A)   Final   Report Status 08/15/2019 FINAL  Final  Culture, blood (routine x 2)     Status: None   Collection Time: 08/13/19  5:52 PM   Specimen: BLOOD  Result Value Ref Range Status   Specimen Description BLOOD RIGHT ANTECUBITAL  Final   Special Requests   Final    BOTTLES DRAWN AEROBIC AND ANAEROBIC Blood Culture results may not be optimal due to an inadequate volume of blood received in culture bottles   Culture   Final    NO GROWTH 5 DAYS Performed at [facility], [place] [address] [place], [place] [address]    Report Status 08/18/2019 FINAL  Final  Culture, blood (routine x 2)     Status: None   Collection Time: 08/13/19  5:53 PM   Specimen: BLOOD  Result Value Ref Range Status   Specimen Description BLOOD LEFT ANTECUBITAL  Final   Special Requests   Final    BOTTLES DRAWN AEROBIC AND ANAEROBIC Blood Culture adequate volume   Culture   Final    NO GROWTH 5 DAYS Performed at [facility], [place] [address], [place], [place] [address]    Report Status 08/18/2019 FINAL  Final  SARS CORONAVIRUS 2 (TAT 6-24 HRS) Nasopharyngeal Nasopharyngeal Swab     Status: None   Collection Time: 08/14/19  5:37 AM   Specimen: Nasopharyngeal Swab  Result Value Ref Range Status   SARS Coronavirus 2 NEGATIVE NEGATIVE Final    Comment: (NOTE) SARS-CoV-2 target nucleic acids are NOT DETECTED. The SARS-CoV-2 RNA is generally detectable in upper and lower respiratory specimens during the acute phase of infection. Negative results do not preclude SARS-CoV-2 infection, do not rule out co-infections with other pathogens, and should not be used as the sole basis for treatment or other patient management decisions. Negative results must be combined with clinical observations, patient history, and epidemiological information. The expected result is Negative. Fact Sheet for Patients: SugarRoll.be Fact Sheet for Healthcare Providers:  https://www.woods-mathews.com/ This test is not yet approved or cleared by the [place] FDA and  has been authorized for detection and/or diagnosis of SARS-CoV-2 by  FDA under an Emergency Use Authorization (EUA). This EUA will remain  in effect (meaning this test can be used) for the duration of the COVID-19 declaration under Section 56 4(b)(1) of the Act, 21 U.S.C. section 360bbb-3(b)(1), unless the authorization is terminated or revoked sooner. Performed at [facility], [place] [address], [place], [place] [address]      Radiology Studies: No results found.  Scheduled Meds: . apixaban  10 mg Oral BID   Followed by  . [START ON 08/22/2019] apixaban  5 mg Oral BID  . carbidopa-levodopa  1 tablet Oral BID WC   And  . Carbidopa-Levodopa ER  1 tablet Oral QHS  . Chlorhexidine Gluconate Cloth  6 each Topical Daily  . Pimavanserin Tartrate  34 mg Oral Daily  . simvastatin  20 mg Oral QPM   Continuous Infusions: . cefTRIAXone (ROCEPHIN)  IV 1 g (08/17/19 1838)     LOS: 4 days   Marylu Lund, MD [facility] Pager On Amion  If 7PM-7AM, please contact night-coverage 08/18/2019, 4:14 PM

## 2019-08-18 NOTE — TOC Progression Note (Signed)
Transition of Care [facility]) - Progression Note    Patient Details  Name: Hammad Bagley MRN: [id number] Date of Birth: [birth year]  Transition of Care [facility]) CM/SW [place], [place] Phone Number: 08/18/2019, 10:08 AM  Clinical Narrative:    CSW spoke with pt wife [name] via telephone. She has received the list of offers via email. She reviewed her CMS list and since [facility] is not an option she is interested in [facility]. Pt wife states she has questions about SNF. Since pt wife is at work CSW offered to text name and phone number for admissions director Levada Dy at [facility]- pt wife in agreement. Sent information for Levada Dy to [phone], pt wife states she will f/u on her lunch break.    Expected Discharge Plan: IP Rehab Facility(vs SNF vs HH) Barriers to Discharge: Continued Medical Work up  Expected Discharge Plan and Services Expected Discharge Plan: IP Rehab Facility(vs SNF vs [facility]) In-house Referral: Clinical Social Work Discharge Planning Services: CM Consult Post Acute Care Choice: IP Rehab Living arrangements for the past 2 months: Apartment  Social Determinants of Health (SDOH) Interventions    Readmission Risk Interventions Readmission Risk Prevention Plan 08/16/2019  Post Dischage Appt Not Complete  Appt Comments disposition tbd  Medication Screening Complete  Transportation Screening Complete  Some recent data might be hidden

## 2019-08-19 DIAGNOSIS — I69391 Dysphagia following cerebral infarction: Secondary | ICD-10-CM | POA: Diagnosis not present

## 2019-08-19 DIAGNOSIS — R338 Other retention of urine: Secondary | ICD-10-CM | POA: Diagnosis not present

## 2019-08-19 DIAGNOSIS — R41841 Cognitive communication deficit: Secondary | ICD-10-CM | POA: Diagnosis not present

## 2019-08-19 DIAGNOSIS — N1330 Unspecified hydronephrosis: Secondary | ICD-10-CM | POA: Diagnosis not present

## 2019-08-19 DIAGNOSIS — N35819 Other urethral stricture, male, unspecified site: Secondary | ICD-10-CM | POA: Diagnosis not present

## 2019-08-19 DIAGNOSIS — K5790 Diverticulosis of intestine, part unspecified, without perforation or abscess without bleeding: Secondary | ICD-10-CM | POA: Diagnosis not present

## 2019-08-19 DIAGNOSIS — N400 Enlarged prostate without lower urinary tract symptoms: Secondary | ICD-10-CM | POA: Diagnosis not present

## 2019-08-19 DIAGNOSIS — I6930 Unspecified sequelae of cerebral infarction: Secondary | ICD-10-CM | POA: Diagnosis not present

## 2019-08-19 DIAGNOSIS — I69819 Unspecified symptoms and signs involving cognitive functions following other cerebrovascular disease: Secondary | ICD-10-CM | POA: Diagnosis not present

## 2019-08-19 DIAGNOSIS — G20 Parkinson's disease: Secondary | ICD-10-CM | POA: Diagnosis not present

## 2019-08-19 DIAGNOSIS — G839 Paralytic syndrome, unspecified: Secondary | ICD-10-CM | POA: Diagnosis not present

## 2019-08-19 DIAGNOSIS — Z7901 Long term (current) use of anticoagulants: Secondary | ICD-10-CM | POA: Diagnosis not present

## 2019-08-19 DIAGNOSIS — R278 Other lack of coordination: Secondary | ICD-10-CM | POA: Diagnosis not present

## 2019-08-19 DIAGNOSIS — E785 Hyperlipidemia, unspecified: Secondary | ICD-10-CM | POA: Diagnosis not present

## 2019-08-19 DIAGNOSIS — N390 Urinary tract infection, site not specified: Secondary | ICD-10-CM | POA: Diagnosis not present

## 2019-08-19 DIAGNOSIS — K219 Gastro-esophageal reflux disease without esophagitis: Secondary | ICD-10-CM | POA: Diagnosis present

## 2019-08-19 DIAGNOSIS — G459 Transient cerebral ischemic attack, unspecified: Secondary | ICD-10-CM | POA: Diagnosis not present

## 2019-08-19 DIAGNOSIS — R3914 Feeling of incomplete bladder emptying: Secondary | ICD-10-CM | POA: Diagnosis not present

## 2019-08-19 DIAGNOSIS — I10 Essential (primary) hypertension: Secondary | ICD-10-CM | POA: Diagnosis not present

## 2019-08-19 DIAGNOSIS — R339 Retention of urine, unspecified: Secondary | ICD-10-CM | POA: Diagnosis not present

## 2019-08-19 DIAGNOSIS — N401 Enlarged prostate with lower urinary tract symptoms: Secondary | ICD-10-CM | POA: Diagnosis not present

## 2019-08-19 DIAGNOSIS — H9190 Unspecified hearing loss, unspecified ear: Secondary | ICD-10-CM | POA: Diagnosis not present

## 2019-08-19 DIAGNOSIS — I951 Orthostatic hypotension: Secondary | ICD-10-CM | POA: Diagnosis not present

## 2019-08-19 DIAGNOSIS — I82412 Acute embolism and thrombosis of left femoral vein: Secondary | ICD-10-CM | POA: Diagnosis not present

## 2019-08-19 DIAGNOSIS — A419 Sepsis, unspecified organism: Secondary | ICD-10-CM | POA: Diagnosis not present

## 2019-08-19 DIAGNOSIS — R2681 Unsteadiness on feet: Secondary | ICD-10-CM | POA: Diagnosis not present

## 2019-08-19 DIAGNOSIS — I69354 Hemiplegia and hemiparesis following cerebral infarction affecting left non-dominant side: Secondary | ICD-10-CM | POA: Diagnosis not present

## 2019-08-19 DIAGNOSIS — N35919 Unspecified urethral stricture, male, unspecified site: Secondary | ICD-10-CM | POA: Diagnosis not present

## 2019-08-19 DIAGNOSIS — R0902 Hypoxemia: Secondary | ICD-10-CM | POA: Diagnosis not present

## 2019-08-19 DIAGNOSIS — M6281 Muscle weakness (generalized): Secondary | ICD-10-CM | POA: Diagnosis not present

## 2019-08-19 DIAGNOSIS — K5900 Constipation, unspecified: Secondary | ICD-10-CM | POA: Diagnosis not present

## 2019-08-19 DIAGNOSIS — I69359 Hemiplegia and hemiparesis following cerebral infarction affecting unspecified side: Secondary | ICD-10-CM | POA: Diagnosis not present

## 2019-08-19 DIAGNOSIS — N3001 Acute cystitis with hematuria: Secondary | ICD-10-CM | POA: Diagnosis not present

## 2019-08-19 DIAGNOSIS — M21372 Foot drop, left foot: Secondary | ICD-10-CM | POA: Diagnosis not present

## 2019-08-19 DIAGNOSIS — I69993 Ataxia following unspecified cerebrovascular disease: Secondary | ICD-10-CM | POA: Diagnosis not present

## 2019-08-19 DIAGNOSIS — Z7401 Bed confinement status: Secondary | ICD-10-CM | POA: Diagnosis not present

## 2019-08-19 DIAGNOSIS — M2550 Pain in unspecified joint: Secondary | ICD-10-CM | POA: Diagnosis not present

## 2019-08-19 LAB — NOVEL CORONAVIRUS, NAA (HOSP ORDER, SEND-OUT TO REF LAB; TAT 18-24 HRS): SARS-CoV-2, NAA: NOT DETECTED

## 2019-08-19 MED ORDER — APIXABAN 5 MG PO TABS: 10.0000 mg | ORAL_TABLET | Freq: Two times a day (BID) | ORAL | 0 refills | Status: DC

## 2019-08-19 MED ORDER — APIXABAN 5 MG PO TABS: 5.0000 mg | ORAL_TABLET | Freq: Two times a day (BID) | ORAL | 0 refills | Status: DC

## 2019-08-19 NOTE — Plan of Care (Signed)
  Problem: Education: Goal: Knowledge of General Education information will improve Description Including pain rating scale, medication(s)/side effects and non-pharmacologic comfort measures Outcome: Progressing   Problem: Health Behavior/Discharge Planning: Goal: Ability to manage health-related needs will improve Outcome: Progressing   Problem: Clinical Measurements: Goal: Cardiovascular complication will be avoided Outcome: Progressing   Problem: Nutrition: Goal: Adequate nutrition will be maintained Outcome: Progressing   Problem: Elimination: Goal: Will not experience complications related to bowel motility Outcome: Progressing Goal: Will not experience complications related to urinary retention Outcome: Progressing   Problem: Pain Managment: Goal: General experience of comfort will improve Outcome: Progressing   Problem: Safety: Goal: Ability to remain free from injury will improve Outcome: Progressing   Problem: Skin Integrity: Goal: Risk for impaired skin integrity will decrease Outcome: Progressing   

## 2019-08-19 NOTE — Progress Notes (Signed)
This RN returned pt's home med, pimavanserin, to pt's wife, Devondre Ackerman.

## 2019-08-19 NOTE — Progress Notes (Signed)
AVS and social worker's paperwork placed in discharge packet. Report called and given to Tammy, RN at [facility]. All questions answered to satisfaction. [facility] to transport pt with all belongings.

## 2019-08-19 NOTE — Discharge Summary (Signed)
Physician Discharge Summary  Robert [name] [id number] DOB: 06/18/1946 DOA: 08/13/2019  PCP: [name], MD  Admit date: 08/13/2019 Discharge date: 08/19/2019  Admitted From: Home Disposition:  SNF  Recommendations for Outpatient Follow-up:  1. Follow up with PCP in 1-2 weeks 2. Follow up with Urology for voiding trial 3. Foley cath care per protocol  Discharge Condition:Improved CODE STATUS:Full Diet recommendation:  Heart healthy  Brief/Interim Summary: 73 year old male with history of Parkinson's disease,largeCVA with left hemiparesis(8 yrs ago) and subsequent DVT, IVC filter,history of urethral stricture was brought to the emergency room with 1 to 2 days of weakness. He was clammy and lightheaded. EMS was called. Noted to havehypotension with SBP in 70s &dark urine. Noted to have urinary retention with PVR of 348 ccin ED. Foley not able to be placed by nursing staff and thus urology consulted who placed a coude cath. Also noted to have a UTI with hematuria.  He was noted to have pitting edema bilaterally and a LE venous duplex was ordered due to h/o prior DVT. Troponin 156. Lactic acid 2.0. Cr elevated from baseline at 1.34 and mildly acidoticwith CO2 21.   Discharge Diagnoses:  Principal Problem:   Orthostatic hypotension Active Problems:   HTN (hypertension)   Hearing loss   UTI (urinary tract infection)   Hemiparesis affecting left side as late effect of cerebrovascular accident ([place])   Hyperlipidemia   Urinary retention   BPH (benign prostatic hyperplasia)   Acute deep vein thrombosis (DVT) of femoral vein of left lower extremity (HCC)  Principal Problem: Orthostatic hypotension - Suspected dehydration - HR currently stable. Recent orthostatic vitals without orthostatic hypotension - Remains stable at this time  Active Problems: Complicated UTI with urinary retention and acute encephalopathy Severe Sepsis with tachycardia, hypotension and  elevated LA - h/o urethral stricture and dilatation in the past by Dr Risa Grill - hash/olower urinary tract symptoms- frequency/ urgency - now with Coude cath placed by Urology, Dr Noah Delaine - will need to f/u with Urology in 2-3 days for voiding trial -Urologyholding off on alpha blocker for now as he was orthostatic - blood cultures NGTD - Completed 7 day course of Ceftriaxone- culture growing > 100,000 strep species - Remains pleasant, alert and oriented this AM  Elevated Cr - Renal ultrasound on 10/19>Mild bilateral renal cortical thinning - baseline Cr 0.9- Cr 1.34 on admission- has improved- likely due to BOO and possibly dehydration - Cr has normalized.  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 Not candidate for CIR given lack of caregiver assist. Plan for d/c to SNF  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- h/ocerebrovascular accident  - on daily aspirin- will hold as pt is now on Eliquis - uses a Cane at baseline -Therapy recs for SNF  Hyperlipidemia - Continued on statin as tolerated  Parkinson's disease - cont Sinemet and Nuplazid - Pt follows Neurology  HTN (hypertension) - Losartan held in setting of AKI -BP stable at this time  Hearing  loss  Discharge Instructions   Allergies as of 08/19/2019      Reactions   Propofol Other (See Comments)   "hiccups for weeks"      Medication List    TAKE these  medications   apixaban 5 MG Tabs tablet Commonly known as: ELIQUIS Take 2 tablets (10 mg total) by mouth 2 (two) times daily for 2 days. Continue through 10/26, then start 5mg  BID dosing on 10/27   apixaban 5 MG Tabs tablet Commonly known as: ELIQUIS Take 1 tablet (5 mg total) by mouth 2 (two) times daily. Start taking on: August 22, 2019   aspirin 81 MG tablet Take 81 mg by mouth daily.   Carbidopa-Levodopa ER 25-100 MG tablet controlled release Commonly known as: SINEMET CR TAKE TWO TABLETS BY MOUTH THREE TIMES DAILY What changed:   how much to take  when to take this  additional instructions   losartan 50 MG tablet Commonly known as: COZAAR Take 25 mg by mouth daily.   Pimavanserin Tartrate 34 MG Caps Take 34 mg by mouth daily.   polyethylene glycol 17 g packet Commonly known as: MiraLax Take 17 g by mouth daily. What changed:   when to take this  reasons to take this   simvastatin 20 MG tablet Commonly known as: ZOCOR Take 20 mg by mouth every evening.      Contact information for after-discharge care    Destination    [facility] Preferred SNF .   Service: Skilled Nursing Contact information: [place] [phone]             Allergies  Allergen Reactions  . Propofol Other (See Comments)    "hiccups for weeks"    Consultations:  Urology  Procedures/Studies: Dg Chest 2 View  Result Date: 08/13/2019 CLINICAL DATA:  Acute weakness. EXAM: CHEST - 2 VIEW COMPARISON:  08/07/2019 FINDINGS: The cardiomediastinal silhouette is unremarkable. Elevation of the RIGHT hemidiaphragm again noted. There is no evidence of focal airspace disease, pulmonary edema, suspicious pulmonary nodule/mass, pleural effusion, or pneumothorax. No acute bony abnormalities are identified. IMPRESSION: 1. No active cardiopulmonary disease. Electronically Signed   By: Margarette Canada M.D.   On: 08/13/2019 15:37    Dg Chest 2 View  Result Date: 08/08/2019 CLINICAL DATA:  Cough, congestion EXAM: CHEST - 2 VIEW COMPARISON:  09/29/2012 FINDINGS: Cardiomegaly. Both lungs are clear. Disc degenerative disease and ankylosis of the thoracic spine. IMPRESSION: Cardiomegaly without acute abnormality of the lungs. Electronically Signed   By: Eddie Candle M.D.   On: 08/08/2019 09:17   US Renal  Result Date: 08/13/2019 CLINICAL DATA:  73 year old male with urinary retention and increased creatinine. EXAM: RENAL / URINARY TRACT ULTRASOUND COMPLETE COMPARISON:  05/08/2017 CT FINDINGS: Right Kidney: Renal measurements: 12.3 x 5.4 x 5.9 cm = volume: 202 mL. Mild cortical thinning noted. Echogenicity within normal limits. No mass or hydronephrosis visualized. Left Kidney: Renal measurements: 12 x 5.1 x 5.9 cm = volume: 187 mL. Mild cortical thinning noted. Echogenicity within normal limits. No mass or hydronephrosis visualized. Bladder: Appears normal for degree of bladder distention. Other: None IMPRESSION: Mild bilateral renal cortical thinning without other significant abnormality. No hydronephrosis. Electronically Signed   By: Margarette Canada M.D.   On: 08/13/2019 21:07   Vas [place] Lower Extremity Venous (dvt)  Result Date: 08/15/2019  Lower Venous Study Indications: Swelling.  Limitations: Body habitus, poor ultrasound/tissue interface and overlying bowel gas. Comparison Study: No prior study.  Performing Technologist: Maudry Mayhew MHA, RDMS, RVT, RDCS  Examination Guidelines: A complete evaluation includes B-mode imaging, spectral Doppler, color Doppler, and power Doppler as needed of all accessible portions of each vessel. Bilateral testing is considered an integral part of a complete examination. Limited examinations for reoccurring indications may be performed as noted.  +---------+---------------+---------+-----------+----------+--------------+ RIGHT    CompressibilityPhasicitySpontaneityPropertiesThrombus Aging  +---------+---------------+---------+-----------+----------+--------------+ CFV      Full           Yes      Yes                                 +---------+---------------+---------+-----------+----------+--------------+ SFJ      Full                                                        +---------+---------------+---------+-----------+----------+--------------+ FV Prox  Full                                                        +---------+---------------+---------+-----------+----------+--------------+ FV Mid   Full                                                        +---------+---------------+---------+-----------+----------+--------------+ FV DistalFull                                                        +---------+---------------+---------+-----------+----------+--------------+ PFV      Full                                                        +---------+---------------+---------+-----------+----------+--------------+ POP      Full           Yes      Yes                                 +---------+---------------+---------+-----------+----------+--------------+ PTV      Full                                                        +---------+---------------+---------+-----------+----------+--------------+ PERO     Full                                                        +---------+---------------+---------+-----------+----------+--------------+  CIV                              Yes                                 +---------+---------------+---------+-----------+----------+--------------+  +---------+---------------+---------+-----------+----------+--------------+ LEFT     CompressibilityPhasicitySpontaneityPropertiesThrombus Aging +---------+---------------+---------+-----------+----------+--------------+ CFV      None                    No                   Acute           +---------+---------------+---------+-----------+----------+--------------+ SFJ      None                                         Acute          +---------+---------------+---------+-----------+----------+--------------+ FV Prox  None                                         Acute          +---------+---------------+---------+-----------+----------+--------------+ FV Mid   None                                         Acute          +---------+---------------+---------+-----------+----------+--------------+ FV DistalNone                                         Acute          +---------+---------------+---------+-----------+----------+--------------+ PFV      None                                         Acute          +---------+---------------+---------+-----------+----------+--------------+ POP      None                    No                   Acute          +---------+---------------+---------+-----------+----------+--------------+ EIV                              Yes                                 +---------+---------------+---------+-----------+----------+--------------+ CIV                              Yes                                 +---------+---------------+---------+-----------+----------+--------------+ Unable to visualize left posterior  tibial and peroneal veins. Unable to visualize IVC.  Summary: Right: There is no evidence of deep vein thrombosis in the lower extremity. No cystic structure found in the popliteal fossa. Left: Findings consistent with acute deep vein thrombosis involving the left common femoral vein, left femoral vein, left proximal profunda vein, and left popliteal vein. No cystic structure found in the popliteal fossa.  *See table(s) above for measurements and observations. Electronically signed by Ruta Hinds MD on 08/15/2019 at 11:12:30 AM.    Final      Subjective: Eager to go to SNF  Discharge Exam: Vitals:    08/19/19 0108 08/19/19 0516  BP:  140/69  Pulse:  78  Resp:    Temp: 99 F (37.2 C) 97.9 F (36.6 C)  SpO2:  97%   Vitals:   08/18/19 1532 08/18/19 2103 08/19/19 0108 08/19/19 0516  BP: 138/74 (!) 124/96  140/69  Pulse: 83 86  78  Resp: 19     Temp: 100 F (37.8 C) (!) 101.2 F (38.4 C) 99 F (37.2 C) 97.9 F (36.6 C)  TempSrc: Oral Oral Oral   SpO2: 98% 96%  97%  Weight:      Height:        General: Pt is alert, awake, not in acute distress Cardiovascular: RRR, S1/S2 +, no rubs, no gallops Respiratory: CTA bilaterally, no wheezing, no rhonchi Abdominal: Soft, NT, ND, bowel sounds + Extremities: no edema, no cyanosis   The results of significant diagnostics from this hospitalization (including imaging, microbiology, ancillary and laboratory) are listed below for reference.     Microbiology: Recent Results (from the past 240 hour(s))  Urine culture     Status: Abnormal   Collection Time: 08/13/19  4:13 PM   Specimen: Urine, Clean Catch  Result Value Ref Range Status   Specimen Description URINE, CLEAN CATCH  Final   Special Requests   Final    NONE Performed at [facility], [place] [address], [place], [place] [address]    Culture >=100,000 COLONIES/mL STREPTOCOCCUS SPECIES (A)  Final   Report Status 08/15/2019 FINAL  Final  Culture, blood (routine x 2)     Status: None   Collection Time: 08/13/19  5:52 PM   Specimen: BLOOD  Result Value Ref Range Status   Specimen Description BLOOD RIGHT ANTECUBITAL  Final   Special Requests   Final    BOTTLES DRAWN AEROBIC AND ANAEROBIC Blood Culture results may not be optimal due to an inadequate volume of blood received in culture bottles   Culture   Final    NO GROWTH 5 DAYS Performed at [facility], [place] [address] [place], [place] [address]    Report Status 08/18/2019 FINAL  Final  Culture, blood (routine x 2)     Status: None   Collection Time: 08/13/19  5:53 PM   Specimen: BLOOD  Result Value Ref Range  Status   Specimen Description BLOOD LEFT ANTECUBITAL  Final   Special Requests   Final    BOTTLES DRAWN AEROBIC AND ANAEROBIC Blood Culture adequate volume   Culture   Final    NO GROWTH 5 DAYS Performed at [facility], [place] [address], [place],  [address]    Report Status 08/18/2019 FINAL  Final  SARS CORONAVIRUS 2 (TAT 6-24 HRS) Nasopharyngeal Nasopharyngeal Swab     Status: None   Collection Time: 08/14/19  5:37 AM   Specimen: Nasopharyngeal Swab  Result Value Ref Range Status  SARS Coronavirus 2 NEGATIVE NEGATIVE Final    Comment: (NOTE) SARS-CoV-2 target nucleic acids are NOT DETECTED. The SARS-CoV-2 RNA is generally detectable in upper and lower respiratory specimens during the acute phase of infection. Negative results do not preclude SARS-CoV-2 infection, do not rule out co-infections with other pathogens, and should not be used as the sole basis for treatment or other patient management decisions. Negative results must be combined with clinical observations, patient history, and epidemiological information. The expected result is Negative. Fact Sheet for Patients: SugarRoll.be Fact Sheet for Healthcare Providers: https://www.woods-mathews.com/ This test is not yet approved or cleared by the [place] FDA and  has been authorized for detection and/or diagnosis of SARS-CoV-2 by FDA under an Emergency Use Authorization (EUA). This EUA will remain  in effect (meaning this test can be used) for the duration of the COVID-19 declaration under Section 56 4(b)(1) of the Act, 21 U.S.C. section 360bbb-3(b)(1), unless the authorization is terminated or revoked sooner. Performed at [facility], [place] [address] [place], [place] [address]   Novel Coronavirus, NAA (Hosp order, Send-out to Ref Lab; TAT 18-24 hrs     Status: None   Collection Time: 08/18/19  1:15 PM   Specimen: Nasopharyngeal Swab; Respiratory  Result  Value Ref Range Status   SARS-CoV-2, NAA NOT DETECTED NOT DETECTED Final    Comment: (NOTE) This nucleic acid amplification test was developed and its performance characteristics determined by [employer]. Nucleic acid amplification tests include PCR and TMA. This test has not been FDA cleared or approved. This test has been authorized by FDA under an Emergency Use Authorization (EUA). This test is only authorized for the duration of time the declaration that circumstances exist justifying the authorization of the emergency use of in vitro diagnostic tests for detection of SARS-CoV-2 virus and/or diagnosis of COVID-19 infection under section 564(b)(1) of the Act, 21 U.S.C. [id number]) (1), unless the authorization is terminated or revoked sooner. When diagnostic testing is negative, the possibility of a false negative result should be considered in the context of a patient's recent exposures and the presence of clinical signs and symptoms consistent with COVID-19. An individual without symptoms of COVID- 19 and who is not shedding SARS-CoV-2 vi rus would expect to have a negative (not detected) result in this assay. Performed At: [facility] [address] [place], [place] [id number] Rush Farmer MD [id number]    [place]  Final    Comment: Performed at [facility], [place] [address] [place], [place] [address]     Labs: BNP (last 3 results) No results for input(s): BNP in the last 8760 hours. Basic Metabolic Panel: Recent Labs  Lab 08/13/19 1604 08/14/19 0406 08/15/19 0237 08/17/19 0303 08/18/19 0415  NA 138 139 138 138 136  K 4.2 3.8 3.7 3.5 3.7  CL 103 107 107 105 104  CO2 21* 22 22 24 23   GLUCOSE 211* 131* 138* 129* 178*  BUN 16 14 14 11 10   CREATININE 1.34* 0.96 1.07 0.86 0.90  CALCIUM 9.2 8.8* 8.9 8.6* 8.7*   Liver Function Tests: Recent Labs  Lab 08/13/19 1604 08/17/19 0303  AST 25 14*  ALT 9 6  ALKPHOS  69 52  BILITOT 1.3* 1.1  PROT 7.1 6.3*  ALBUMIN 3.8 2.9*   No results for input(s): LIPASE, AMYLASE in the last 168 hours. No results for input(s): AMMONIA in the last 168 hours. CBC: Recent Labs  Lab 08/13/19 1604 08/14/19 0406 08/17/19 0303  08/18/19 0415  WBC 9.1 9.6 8.3 8.4  NEUTROABS 7.6  --   --   --   HGB 15.8 15.0 13.5 13.6  HCT 49.1 46.6 40.6 41.7  MCV 92.8 91.4 89.2 90.3  PLT 249 217 196 244   Cardiac Enzymes: No results for input(s): CKTOTAL, CKMB, CKMBINDEX, TROPONINI in the last 168 hours. BNP: Invalid input(s): POCBNP CBG: Recent Labs  Lab 08/14/19 0753  GLUCAP 132*   D-Dimer No results for input(s): DDIMER in the last 72 hours. Hgb A1c No results for input(s): HGBA1C in the last 72 hours. Lipid Profile No results for input(s): CHOL, HDL, LDLCALC, TRIG, CHOLHDL, LDLDIRECT in the last 72 hours. Thyroid function studies No results for input(s): TSH, T4TOTAL, T3FREE, THYROIDAB in the last 72 hours.  Invalid input(s): FREET3 Anemia work up No results for input(s): VITAMINB12, FOLATE, FERRITIN, TIBC, IRON, RETICCTPCT in the last 72 hours. Urinalysis    Component Value Date/Time   COLORURINE AMBER (A) 08/13/2019 1613   APPEARANCEUR CLOUDY (A) 08/13/2019 1613   LABSPEC 1.019 08/13/2019 1613   PHURINE 5.0 08/13/2019 1613   GLUCOSEU 50 (A) 08/13/2019 1613   GLUCOSEU 100 (A) 02/06/2019 0813   HGBUR LARGE (A) 08/13/2019 1613   BILIRUBINUR NEGATIVE 08/13/2019 1613   KETONESUR 5 (A) 08/13/2019 1613   PROTEINUR 100 (A) 08/13/2019 1613   UROBILINOGEN 1.0 02/06/2019 0813   NITRITE NEGATIVE 08/13/2019 1613   LEUKOCYTESUR MODERATE (A) 08/13/2019 1613   Sepsis Labs Invalid input(s): PROCALCITONIN,  WBC,  LACTICIDVEN Microbiology Recent Results (from the past 240 hour(s))  Urine culture     Status: Abnormal   Collection Time: 08/13/19  4:13 PM   Specimen: Urine, Clean Catch  Result Value Ref Range Status   Specimen Description URINE, CLEAN CATCH  Final    Special Requests   Final    NONE Performed at [facility], [place] [address], [place], [place] [address]    Culture >=100,000 COLONIES/mL STREPTOCOCCUS SPECIES (A)  Final   Report Status 08/15/2019 FINAL  Final  Culture, blood (routine x 2)     Status: None   Collection Time: 08/13/19  5:52 PM   Specimen: BLOOD  Result Value Ref Range Status   Specimen Description BLOOD RIGHT ANTECUBITAL  Final   Special Requests   Final    BOTTLES DRAWN AEROBIC AND ANAEROBIC Blood Culture results may not be optimal due to an inadequate volume of blood received in culture bottles   Culture   Final    NO GROWTH 5 DAYS Performed at [facility], [place] [address], [place], [place] [address]    Report Status 08/18/2019 FINAL  Final  Culture, blood (routine x 2)     Status: None   Collection Time: 08/13/19  5:53 PM   Specimen: BLOOD  Result Value Ref Range Status   Specimen Description BLOOD LEFT ANTECUBITAL  Final   Special Requests   Final    BOTTLES DRAWN AEROBIC AND ANAEROBIC Blood Culture adequate volume   Culture   Final    NO GROWTH 5 DAYS Performed at [facility], [place] [address] [place], [place] [address]    Report Status 08/18/2019 FINAL  Final  SARS CORONAVIRUS 2 (TAT 6-24 HRS) Nasopharyngeal Nasopharyngeal Swab     Status: None   Collection Time: 08/14/19  5:37 AM   Specimen: Nasopharyngeal Swab  Result Value Ref Range Status   SARS Coronavirus 2 NEGATIVE NEGATIVE Final    Comment: (NOTE) SARS-CoV-2 target nucleic  acids are NOT DETECTED. The SARS-CoV-2 RNA is generally detectable in upper and lower respiratory specimens during the acute phase of infection. Negative results do not preclude SARS-CoV-2 infection, do not rule out co-infections with other pathogens, and should not be used as the sole basis for treatment or other patient management decisions. Negative results must be combined with clinical observations, patient history, and epidemiological information. The  expected result is Negative. Fact Sheet for Patients: SugarRoll.be Fact Sheet for Healthcare Providers: https://www.woods-mathews.com/ This test is not yet approved or cleared by the [place] FDA and  has been authorized for detection and/or diagnosis of SARS-CoV-2 by FDA under an Emergency Use Authorization (EUA). This EUA will remain  in effect (meaning this test can be used) for the duration of the COVID-19 declaration under Section 56 4(b)(1) of the Act, 21 U.S.C. section 360bbb-3(b)(1), unless the authorization is terminated or revoked sooner. Performed at [facility], [place] [address] [place], [place] [address]   Novel Coronavirus, NAA (Hosp order, Send-out to Ref Lab; TAT 18-24 hrs     Status: None   Collection Time: 08/18/19  1:15 PM   Specimen: Nasopharyngeal Swab; Respiratory  Result Value Ref Range Status   SARS-CoV-2, NAA NOT DETECTED NOT DETECTED Final    Comment: (NOTE) This nucleic acid amplification test was developed and its performance characteristics determined by [employer]. Nucleic acid amplification tests include PCR and TMA. This test has not been FDA cleared or approved. This test has been authorized by FDA under an Emergency Use Authorization (EUA). This test is only authorized for the duration of time the declaration that circumstances exist justifying the authorization of the emergency use of in vitro diagnostic tests for detection of SARS-CoV-2 virus and/or diagnosis of COVID-19 infection under section 564(b)(1) of the Act, 21 U.S.C. [id number]) (1), unless the authorization is terminated or revoked sooner. When diagnostic testing is negative, the possibility of a false negative result should be considered in the context of a patient's recent exposures and the presence of clinical signs and symptoms consistent with COVID-19. An individual without symptoms of COVID- 19 and who is not shedding  SARS-CoV-2 vi rus would expect to have a negative (not detected) result in this assay. Performed At: [facility] [address] [place], [place] [id number] Rush Farmer MD [id number]    [place]  Final    Comment: Performed at [facility], [place] [address] [place], [place] [address]   Time spent: 30 min  SIGNED:   Marylu Lund, MD  [facility] 08/19/2019, 2:05 PM  If 7PM-7AM, please contact night-coverage

## 2019-08-19 NOTE — TOC Progression Note (Signed)
Transition of Care [facility]) - Progression Note    Patient Details  Name: Robert [name] MRN: [id number] Date of Birth: Jun 03, 1946  Transition of Care [facility]) CM/SW [place], [place] Phone Number: 08/19/2019, 2:45 PM  Clinical Narrative:     Patient will Discharge To: [facility] Anticipated DC Date:08/19/2019 Family Notified:yes, spouse, Jarmel Kmetz Transport By: Corey Harold   Per MD patient ready for DC to [facility] . RN, patient, patient's family, and facility notified of DC. Assessment, Fl2/Pasrr, and Discharge Summary sent to facility. RN given number for report [phone], Room 310). DC packet on chart. Ambulance transport requested for patient.   CSW signing off.  Reed Breech LCSWA [phone]    Expected Discharge Plan: IP Rehab Facility(vs SNF vs [facility]) Barriers to Discharge: No Barriers Identified  Expected Discharge Plan and Services Expected Discharge Plan: IP Rehab Facility(vs SNF vs [facility]) In-house Referral: Clinical Social Work Discharge Planning Services: CM Consult Post Acute Care Choice: IP Rehab Living arrangements for the past 2 months: Apartment Expected Discharge Date: 08/19/19                                     Social Determinants of Health (SDOH) Interventions    Readmission Risk Interventions Readmission Risk Prevention Plan 08/16/2019  Post Dischage Appt Not Complete  Appt Comments disposition tbd  Medication Screening Complete  Transportation Screening Complete  Some recent data might be hidden

## 2019-08-20 DIAGNOSIS — H9190 Unspecified hearing loss, unspecified ear: Secondary | ICD-10-CM | POA: Diagnosis not present

## 2019-08-20 DIAGNOSIS — N35919 Unspecified urethral stricture, male, unspecified site: Secondary | ICD-10-CM | POA: Diagnosis not present

## 2019-08-20 DIAGNOSIS — N400 Enlarged prostate without lower urinary tract symptoms: Secondary | ICD-10-CM | POA: Diagnosis not present

## 2019-08-20 DIAGNOSIS — G20 Parkinson's disease: Secondary | ICD-10-CM | POA: Diagnosis not present

## 2019-08-20 DIAGNOSIS — I10 Essential (primary) hypertension: Secondary | ICD-10-CM | POA: Diagnosis not present

## 2019-08-20 DIAGNOSIS — E785 Hyperlipidemia, unspecified: Secondary | ICD-10-CM | POA: Diagnosis not present

## 2019-08-20 DIAGNOSIS — R339 Retention of urine, unspecified: Secondary | ICD-10-CM | POA: Diagnosis not present

## 2019-08-20 DIAGNOSIS — I69359 Hemiplegia and hemiparesis following cerebral infarction affecting unspecified side: Secondary | ICD-10-CM | POA: Diagnosis not present

## 2019-08-20 DIAGNOSIS — A419 Sepsis, unspecified organism: Secondary | ICD-10-CM | POA: Diagnosis not present

## 2019-08-30 ENCOUNTER — Ambulatory Visit: Payer: Medicare Other | Admitting: Cardiology

## 2019-09-04 ENCOUNTER — Other Ambulatory Visit: Payer: Self-pay | Admitting: *Deleted

## 2019-09-04 DIAGNOSIS — N401 Enlarged prostate with lower urinary tract symptoms: Secondary | ICD-10-CM | POA: Diagnosis not present

## 2019-09-04 DIAGNOSIS — R3914 Feeling of incomplete bladder emptying: Secondary | ICD-10-CM | POA: Diagnosis not present

## 2019-09-04 NOTE — Patient Outreach (Signed)
Member assessed for potential [facility] Care Management needs as a benefit of [place] Medicare.   Member is currently receiving rehab therapy at [facility].    Will follow for potential [facility] Care Management needs, progression, and for disposition plans. Will collaborate with [facility] UM team and facility on member while she resides in SNF.   Marthenia Rolling, MSN-Ed, RN,BSN [place] Acute Care Coordinator [phone] [facility]) [phone]  (Toll free office)

## 2019-09-06 ENCOUNTER — Other Ambulatory Visit: Payer: Self-pay | Admitting: *Deleted

## 2019-09-06 DIAGNOSIS — N35919 Unspecified urethral stricture, male, unspecified site: Secondary | ICD-10-CM | POA: Diagnosis not present

## 2019-09-06 DIAGNOSIS — I69993 Ataxia following unspecified cerebrovascular disease: Secondary | ICD-10-CM | POA: Diagnosis not present

## 2019-09-06 DIAGNOSIS — I10 Essential (primary) hypertension: Secondary | ICD-10-CM

## 2019-09-06 DIAGNOSIS — R339 Retention of urine, unspecified: Secondary | ICD-10-CM | POA: Diagnosis not present

## 2019-09-06 DIAGNOSIS — G20 Parkinson's disease: Secondary | ICD-10-CM | POA: Diagnosis not present

## 2019-09-06 NOTE — Patient Outreach (Signed)
Member assessed for potential [facility] Care Management needs as a benefit of  [place] Medicare.  Member is currently receiving rehab therapy at [facility].  Update received from [place] that member will discharge home tomorrow (patient driven) with wife, per facility.  Telephone call made to Mrs. Carbonell at [phone] to discuss [place] Management follow up. Patient identifiers confirmed. Conversation was brief as Mrs. Spickard states she  was still working. Mrs. Barb endorses that Mr. Gauer will return home tomorrow. She is agreeable to [place] Management services.   Mr. Hassinger has a medical history of CVA with left sided hemiparesis, Parkinson's HTN, DVT, IVC filter.   Will make [place] Management referral for [facility] RNCM.    Marthenia Rolling, MSN-Ed, RN,BSN [place] Acute Care Coordinator [phone] [facility]) [phone]  (Toll free office)

## 2019-09-07 ENCOUNTER — Other Ambulatory Visit: Payer: Self-pay | Admitting: *Deleted

## 2019-09-07 NOTE — Patient Outreach (Signed)
Member assessed for potential [facility] Care Management needs as a benefit of  [place] Medicare.  Member is currently receiving rehab therapy at [facility].  Member discussed in weekly telephonic IDT meeting with facility staff, [facility] UM team, and writer.  Facility confirms Mr. Rinke will [place] home with wife today with [place] home health.  Discussed that referral has already been made for [facility] Care Management upon dc.    Marthenia Rolling, MSN-Ed, RN,BSN [place] Acute Care Coordinator [phone] [facility]) [phone]  (Toll free office)

## 2019-09-08 ENCOUNTER — Other Ambulatory Visit: Payer: Self-pay | Admitting: *Deleted

## 2019-09-08 DIAGNOSIS — R3914 Feeling of incomplete bladder emptying: Secondary | ICD-10-CM | POA: Diagnosis not present

## 2019-09-08 NOTE — Patient Outreach (Signed)
[facility]) Care Management  09/08/2019  Robert [name] 1946/10/11 [id number]   Referral received from [facility] coordinator as member was discharged from SNF yesterday.  Per chart, he has history of stroke, hypertension, BPH, UTI's, and hyperlipidemia.  Call was placed to [facility] caregiver/wife, [name].  She report she does not have time to talk as she is trying to get him out of the care for an appointment.  Request for member to call back when available, this care manager will follow up within the next 2 business days.  [name], [place], MSN [place] [phone]

## 2019-09-11 DIAGNOSIS — I951 Orthostatic hypotension: Secondary | ICD-10-CM | POA: Diagnosis not present

## 2019-09-11 DIAGNOSIS — Z09 Encounter for follow-up examination after completed treatment for conditions other than malignant neoplasm: Secondary | ICD-10-CM | POA: Diagnosis not present

## 2019-09-11 DIAGNOSIS — I10 Essential (primary) hypertension: Secondary | ICD-10-CM | POA: Diagnosis not present

## 2019-09-11 DIAGNOSIS — G8190 Hemiplegia, unspecified affecting unspecified side: Secondary | ICD-10-CM | POA: Diagnosis not present

## 2019-09-11 DIAGNOSIS — R339 Retention of urine, unspecified: Secondary | ICD-10-CM | POA: Diagnosis not present

## 2019-09-11 DIAGNOSIS — N390 Urinary tract infection, site not specified: Secondary | ICD-10-CM | POA: Diagnosis not present

## 2019-09-11 DIAGNOSIS — N400 Enlarged prostate without lower urinary tract symptoms: Secondary | ICD-10-CM | POA: Diagnosis not present

## 2019-09-11 DIAGNOSIS — I82402 Acute embolism and thrombosis of unspecified deep veins of left lower extremity: Secondary | ICD-10-CM | POA: Diagnosis not present

## 2019-09-12 ENCOUNTER — Other Ambulatory Visit: Payer: Self-pay | Admitting: *Deleted

## 2019-09-12 ENCOUNTER — Encounter: Payer: Self-pay | Admitting: *Deleted

## 2019-09-12 NOTE — Patient Outreach (Signed)
[facility]) Care Management  09/12/2019  Yeshayahu Myron 1946-09-07 [id number]   Outreach attempt #2, successful.  Call placed to member's wife [name] as chart indicates she is contact person.  Member's identity verified, this care manager introduced self ans stated purpose of call.  [facility] care management services explained.  She report member is still weak, anxious about starting PT/OT sessions.  State she has not heard from home health team [facility]) about services, however then state there was a nurse/therapist that came out to the home for assessment of needs but haven't had any actual sessions.  Advised that therapist will call to schedule visit, if no call within the next couple days she will follow up with [place] directly.  State member was walking using a cane prior to hospitalization, eager to build strength back up to walk again and perform ADL's.  Wife report member was seen by PCP on 11/16, placed on antibiotics again for recurrent UTI.  State he has had this issue since his stroke in 2008, has foley catheter still in place due to urinary retention.  Has follow up with urology on 11/24, hoping to have it removed at that time.  She is monitoring member's fluid levels as he has been experiencing hypotension.  She has parameters to notify MD if blood pressure consistently less that [id number] systolic.  State his normal range is 118-120/70's.    Expresses frustration regarding financial status now that she has stopped working in order to care for member.  She is concerned about medication cost as well as food resources. Also concerned that she will experience caregiver burnout and is interested in support groups.  Member does not qualify for Medicaid but he is eligible for VA benefits.  Wife denies any urgent concerns at this time, will follow up within the next week.  Will also place referrals to social worker and pharmacy.  Fall Risk  09/12/2019 07/18/2019 06/21/2019  03/10/2019 08/23/2018  Falls in the past year? 0 0 0 0 No  Number falls in past yr: 0 0 0 - -  Injury with Fall? 0 0 0 - -  Follow up - - - Falls evaluation completed -   THN CM Care Plan Problem One     Most Recent Value  Care Plan Problem One  Risk for readmission related to weakness as evidenced by recent hospitalization requiring rehab stay  Role Documenting the Problem One  Care Management [place] for Problem One  Active  [facility] Long Term Goal   Member will not be readmitted to hospital within the next 31 days  THN Long Term Goal Start Date  09/12/19  Interventions for Problem One Long Term Goal  Discharge instructions reviewed with wife.  Advised of importance of following plan of care in effort to decrease risk of readmission.  Referrals placed to social worker and pharmacy  [facility] CM Short Term Goal #1   Wife will report checking member's blood pressure daily over the next 2 weeks  THN CM Short Term Goal #1 Start Date  09/12/19  Interventions for Short Term Goal #1  Verified member has working blood pressure monitor in the home, advised importance of checking daily in effort to monitor hypotension and collaborate with MD office for abnormal values  THN CM Short Term Goal #2   Wife will report start of PT sessions within the next week  [facility] CM Short Term Goal #2 Start Date  09/12/19  Interventions for Short Term Goal #  2  Confirmed with wife that agency has been to home for assessment, advised to call to inquire about start of therapy sessions     [name], Therapist, sports, MSN [place] Manager [phone]

## 2019-09-13 ENCOUNTER — Encounter: Payer: Self-pay | Admitting: *Deleted

## 2019-09-14 ENCOUNTER — Telehealth: Payer: Self-pay

## 2019-09-14 ENCOUNTER — Encounter: Payer: Self-pay | Admitting: Pharmacist

## 2019-09-14 ENCOUNTER — Other Ambulatory Visit: Payer: Self-pay | Admitting: Pharmacist

## 2019-09-14 NOTE — Patient Outreach (Addendum)
[facility]) Care Management  [place]   09/14/2019  [address] [address] 1946-04-11 [id number]  Reason for referral: Medication Assistance  Referral source: [facility] RN Current insurance: [facility]  PMHx includes but not limited to:  DVT, Parkinson's, HTN, HLD, UTIs  Outreach:  Successful telephone call with Raquel James, patient's wife.  HIPAA identifiers verified.   Subjective:   Patient's wife expressed difficulty affording all of the patient's medications, especially with Eliquis.  Patient's wife stated that she had recently quit her job to take care of the patient, and their current income is just from patient's social security benefits, which was about $1,718 /month.  Patient's wife stated that she was working with the patient's PCP Dr. Rolland Bimler to apply for Eliquis patient's assistance.  Patient's wife stated that [employer] was able to waive the first month supply of Eliquis and that she could receive it without copay. (with free coupon)   Patient's wife confirmed that the patient did not have Part D benefits, but that he had enrolled for Part D benefits for next year.  Objective: The ASCVD Risk score Mikey Bussing DC Brooke Bonito., et al., 2013) failed to calculate for the following reasons:   Cannot find a previous HDL lab   Cannot find a previous total cholesterol lab  Lab Results  Component Value Date   CREATININE 0.90 08/18/2019   CREATININE 0.86 08/17/2019   CREATININE 1.07 08/15/2019    No results found for: HGBA1C  Lipid Panel  No results found for: CHOL, TRIG, HDL, CHOLHDL, VLDL, LDLCALC, LDLDIRECT  BP Readings from Last 3 Encounters:  08/19/19 119/64  07/18/19 126/74  03/10/19 125/63    Allergies  Allergen Reactions  . Propofol Other (See Comments)    "hiccups for weeks"    Medications Reviewed Today    Reviewed by [name], RN (Registered Nurse) on 09/12/19 at 1326  Med List Status: <None>  Medication Order Taking? Sig  Documenting Provider Last Dose Status Informant  apixaban (ELIQUIS) 5 MG TABS tablet [id number] Yes Take 1 tablet (5 mg total) by mouth 2 (two) times daily. Donne Hazel, MD Taking Active   apixaban (ELIQUIS) 5 MG TABS tablet [id number]  Take 2 tablets (10 mg total) by mouth 2 (two) times daily for 2 days. Continue through 10/26, then start '5mg'$  BID dosing on 10/27 Donne Hazel, MD  Expired 08/21/19 2359   aspirin 81 MG tablet [id number] No Take 81 mg by mouth daily. [provider] Not Taking Active Spouse/Significant Other  Carbidopa-Levodopa ER (SINEMET CR) 25-100 MG tablet controlled release [id number] Yes TAKE TWO TABLETS BY MOUTH THREE TIMES DAILY   Patient taking differently: Take 1-2 tablets by mouth See admin instructions. Take 2 tablets every morning and at lunch then take 1 tablet at night   Tat, Eustace Quail, DO Taking Active Spouse/Significant Other  losartan (COZAAR) 50 MG tablet [id number] Yes Take 25 mg by mouth daily.  [provider] Taking Active Spouse/Significant Other           Med Note Tamala Julian, [place] Gleason   Sun Aug 13, 2019 11:50 PM)    Pimavanserin Tartrate 34 MG CAPS [id number] Yes Take 34 mg by mouth daily.  [provider] Taking Active Spouse/Significant Other  polyethylene glycol (MIRALAX) packet [id number] Yes Take 17 g by mouth daily.  Patient taking differently: Take 17 g by mouth daily as needed for mild constipation.    [place], Fredia Sorrow, MD Taking Active Spouse/Significant Other  Med Note Tamala Julian, JEFFREY W   Sun Aug 13, 2019 11:50 PM)    simvastatin (ZOCOR) 20 MG tablet [id number] Yes Take 20 mg by mouth every evening. [provider] Taking Active Spouse/Significant Other  tamsulosin (FLOMAX) 0.4 MG CAPS capsule [id number] Yes Take 0.4 mg by mouth daily. [provider] Taking Active   Med List Note Deveron Furlong, Jonell Cluck, CPhT 09/28/12 1916): [facility] [place]           Assessment: Drugs sorted by  system:  Neurologic/Psychologic: carbidopa-levodopa CR 25-'100mg'$  2T qAM, 1T qPM, pimavanserin '34mg'$  daily  Hematologic: apixaban '5mg'$  BID, ASA '81mg'$  daily  Cardiovascular: losartan '50mg'$  daily, simvastatin '20mg'$  daily.  Gastrointestinal: polyethylene glycol PRN  Genitourinary: finasteride '5mg'$  daily, tamsulosin 0.'4mg'$  daily.  Medication Review Findings: No medication interactions.   Medication Assistance Findings:   Called [employer] and confirmed that the patient is already receiving patient's assistance for Nuplazid. Application is available until 05/2020.  Called BMS to determine if they had received the patient's application. They stated they had only received the supporting documents.  Extra Help:  May be eligible for full/partial Extra Help Low Income Subsidy based on reported income and assets   Patient Assistance Programs: Eliquis made by BMS o Income requirement met: Yes o Out-of-pocket prescription expenditure met:   Not Applicable - patient does not have part D. - Patient has met application requirements to apply for this program.     Plan: . Assisted patient with applying for Extra Help.   . Follow up with PCP Dr. Gaynelle Cage office about the status of BMS application for Eliquis.  [name], PharmD, [place] Clinical Pharmacist [phone]

## 2019-09-14 NOTE — Patient Outreach (Signed)
[facility]) Care Management  09/14/2019  Robert [name] 06-Sep-1946 [id number]  Received a phone call from [facility] in Dr. Darien Ramus office. She confirmed sending everything to [name] Patient Assistance Program today at 11:19am. She also said she was planning on calling them back to see if they received what she sent.  Called patient's wife back. HIPAA identifiers were obtained.  Relayed message about forms being faxed.  Follow up next week.  [name], PharmD, [place] Clinical Pharmacist [phone]

## 2019-09-15 ENCOUNTER — Other Ambulatory Visit: Payer: Self-pay

## 2019-09-15 NOTE — Patient Outreach (Signed)
[facility] [facility] Care Management  09/15/2019  Robert [name] August 05, 1946 [id number]   Social work referral received from [facility] [place], on 09/14/19.  Referral stated, "Wife is primary caregiver, had to leave job to care for patient. Now reporting decrease in funds and interested in food resources. Also requesting information on caregiver support and potentially having someone come in the home to help as he is not strong enough to be alone. He does have VA benefits." Successful outreach to spouse today.  Informed spouse that aide services are typically only covered by Medicare if in conjunction with a skilled service.  Discussed Medicaid eligibility but spouse stated they would not qualify.  Patient does have appointment scheduled at the [place] on 10/03/19.  Spouse was encouraged to inquire about in-home aide benefits. Talked with spouse about available food resources.  Will mail [employer] [place], and Application for [employer] and Nutrition Services.   Will follow up after [place] appointment regarding aide benefits.  Ronn Melena, BSW Social Worker [phone]

## 2019-09-18 ENCOUNTER — Other Ambulatory Visit: Payer: Self-pay | Admitting: Pharmacist

## 2019-09-19 ENCOUNTER — Other Ambulatory Visit: Payer: Self-pay | Admitting: *Deleted

## 2019-09-19 DIAGNOSIS — N401 Enlarged prostate with lower urinary tract symptoms: Secondary | ICD-10-CM | POA: Diagnosis not present

## 2019-09-19 DIAGNOSIS — R3914 Feeling of incomplete bladder emptying: Secondary | ICD-10-CM | POA: Diagnosis not present

## 2019-09-19 NOTE — Patient Outreach (Signed)
Robert [name]) Care Management  09/19/2019  Robert [name] 03/12/1946 [id number]  Patient was called to follow up on medication assistance with Eliquis.  Nurse from patient's provider's office called back last week and said she faxed all the necessary information to [place] Patient Assistance Program on the patient's behalf.  His wife answered the phone and provided HIPAA.  She said the patient had been approved through BMS and they were awaiting delivery of the medication.  She asked some general questions about how Medicare Part D works. The donut hole was reviewed.  Plan: Follow up with patient to see if they heard back from Social Security about LIS and to see if they received the Eliquis delivery. Also make sure patient is adjusted to his new plan. (Next year will be his first year being enrolled in a Medicare Part D plan).  Follow up in 6-8 weeks.  Robert [name], PharmD, [place] Clinical Pharmacist [phone]

## 2019-09-19 NOTE — Patient Outreach (Signed)
[facility]) Care Management  09/19/2019  Robert [name] Mar 19, 1946 [id number]   Weekly transition of care call placed to member's caregiver/wife.  She report member had urology appointment today, had foley catheter replaced.  The plan is for him to have it in place for the next 3 months, going back monthly to have it changed.  They will do a void trial during next visit (in 2 weeks), if no signs of retention it will be removed completely.  She is still monitoring blood pressure to keep an eye on potential hypotension, today's reading was 110/68.  He has started PT but she is concerned that he also need OT.  She will call Nanine Means to inquire about order as she think one was already placed for that discipline.  Denies any urgent concerns at this time, advised to contact with questions.  Will follow up within the next week.  [facility] CM Care Plan Problem One     Most Recent Value  Care Plan Problem One  Risk for readmission related to weakness as evidenced by recent hospitalization requiring rehab stay  Role Documenting the Problem One  Care Management [place] for Problem One  Active  [facility] Long Term Goal   Member will not be readmitted to hospital within the next 31 days  THN Long Term Goal Start Date  09/12/19  Interventions for Problem One Long Term Goal  Confirmed wife has been in contact with social worker and pharmacist to collaborate on plan of care.  Upcoming appointents reviewed with wife in effort to decrease risk of readmission.  THN CM Short Term Goal #1   Wife will report checking member's blood pressure daily over the next 2 weeks  THN CM Short Term Goal #1 Start Date  09/12/19  Interventions for Short Term Goal #1  Discussed BP parameters with wife and when to contact MD with abnormal readings  THN CM Short Term Goal #2   Wife will report start of PT sessions within the next week  THN CM Short Term Goal #2 Start Date  09/12/19  [facility] CM Short Term Goal #2  Met Date  09/19/19  THN CM Short Term Goal #3  Wife will report no signs/symptoms of infection over the next 3 weeks  THN CM Short Term Goal #3 Start Date  09/19/19  Interventions for Short Tern Goal #3  Educated on signs/symptoms of infection, advised to contact MD office if suspect infection     [name], Therapist, sports, MSN [place] Manager [phone]

## 2019-09-25 ENCOUNTER — Other Ambulatory Visit: Payer: Self-pay | Admitting: *Deleted

## 2019-09-25 ENCOUNTER — Other Ambulatory Visit: Payer: Self-pay

## 2019-09-25 NOTE — Patient Outreach (Signed)
[facility]) Care Management  09/25/2019  Gurshaan Snellen 1946/08/22 [id number]   Successful follow up call to patient's spouse today.   She confirmed receipt of food resources.  Provided her with mailing address to send Application for Food and Nutrition Services.  Denied need for additional assistance at this time so social work case is being closed.  Ronn Melena, BSW Social Worker [phone]

## 2019-09-25 NOTE — Patient Outreach (Signed)
Robert [name]) Care Management  09/25/2019  Robert [name] 05/10/1946 [id number]   Weekly transition of care call placed to member's wife.  She report member is progressing, state therapy is "taking some time."  Confirms that he will also have OT in addition to PT which will start tomorrow.  She was concerned on Thanksgiving because his foley bag began to leak and she didn't have any replacement bags.  Attempted to call [facility] without success but was able to get replacement bag from hospital ED.  Home health nurse has since ordered bags to keep in the home.  She report receiving paperwork for food assistance and food stamps application but is unsure who to send the information back to.  [name] social worker notified of question and will contact wife directly.  Wife denies any urgent concerns at this time, will follow up within the next week.  THN CM Care Plan Problem One     Most Recent Value  Care Plan Problem One  Risk for readmission related to weakness as evidenced by recent hospitalization requiring rehab stay  Role Documenting the Problem One  Care Management [place] for Problem One  Active  [facility] Long Term Goal   Member will not be readmitted to hospital within the next 31 days  THN Long Term Goal Start Date  09/12/19  Interventions for Problem One Long Term Goal  Educated on importance of participating with PT and OT as well as doing exercises independently in effort to increase strength back to baseline  THN CM Short Term Goal #1   Wife will report checking member's blood pressure daily over the next 2 weeks  THN CM Short Term Goal #1 Start Date  09/12/19  [facility] [name] CM Short Term Goal #1 Met Date  09/25/19  THN CM Short Term Goal #2   Wife will report attendance to [place] MD visit within the next week  THN CM Short Term Goal #2 Start Date  09/25/19  Interventions for Short Term Goal #2  Upcoming appointment reviewed, assessed the need for transportation assistance   [facility] CM Short Term Goal #3  Wife will report no signs/symptoms of infection over the next 3 weeks  THN CM Short Term Goal #3 Start Date  09/19/19  Interventions for Short Tern Goal #3  Reviewed signs/symptoms of infections.  Discussed having extra bags for catheter in case of emergencies.     [name], [place], MSN [place] [phone]

## 2019-09-28 ENCOUNTER — Emergency Department (HOSPITAL_COMMUNITY)
Admission: EM | Admit: 2019-09-28 | Discharge: 2019-09-29 | Disposition: A | Discharge status: Home / Self Care | Payer: Medicare Other | Attending: Emergency Medicine | Admitting: Emergency Medicine

## 2019-09-28 ENCOUNTER — Emergency Department (HOSPITAL_COMMUNITY): Payer: Medicare Other

## 2019-09-28 ENCOUNTER — Other Ambulatory Visit: Payer: Self-pay

## 2019-09-28 ENCOUNTER — Encounter (HOSPITAL_COMMUNITY): Payer: Self-pay | Admitting: Emergency Medicine

## 2019-09-28 DIAGNOSIS — Z87891 Personal history of nicotine dependence: Secondary | ICD-10-CM | POA: Insufficient documentation

## 2019-09-28 DIAGNOSIS — I10 Essential (primary) hypertension: Secondary | ICD-10-CM | POA: Diagnosis not present

## 2019-09-28 DIAGNOSIS — R0902 Hypoxemia: Secondary | ICD-10-CM | POA: Diagnosis not present

## 2019-09-28 DIAGNOSIS — Z20828 Contact with and (suspected) exposure to other viral communicable diseases: Secondary | ICD-10-CM | POA: Insufficient documentation

## 2019-09-28 DIAGNOSIS — R509 Fever, unspecified: Secondary | ICD-10-CM

## 2019-09-28 DIAGNOSIS — N390 Urinary tract infection, site not specified: Secondary | ICD-10-CM | POA: Diagnosis not present

## 2019-09-28 LAB — LACTIC ACID, PLASMA: Lactic Acid, Venous: 1 mmol/L (ref 0.5–1.9)

## 2019-09-28 LAB — COMPREHENSIVE METABOLIC PANEL
ALT: 5 U/L (ref 0–44)
AST: 12 U/L — ABNORMAL LOW (ref 15–41)
Albumin: 3.3 g/dL — ABNORMAL LOW (ref 3.5–5.0)
Alkaline Phosphatase: 62 U/L (ref 38–126)
Anion gap: 9 (ref 5–15)
BUN: 13 mg/dL (ref 8–23)
CO2: 24 mmol/L (ref 22–32)
Calcium: 9.1 mg/dL (ref 8.9–10.3)
Chloride: 101 mmol/L (ref 98–111)
Creatinine, Ser: 0.89 mg/dL (ref 0.61–1.24)
GFR calc Af Amer: >60 mL/min (ref >60)
GFR calc non Af Amer: >60 mL/min (ref >60)
Glucose, Bld: 137 mg/dL — ABNORMAL HIGH (ref 70–99)
Potassium: 3.8 mmol/L (ref 3.5–5.1)
Sodium: 134 mmol/L — ABNORMAL LOW (ref 135–145)
Total Bilirubin: 1 mg/dL (ref 0.3–1.2)
Total Protein: 7.1 g/dL (ref 6.5–8.1)

## 2019-09-28 LAB — URINALYSIS, ROUTINE W REFLEX MICROSCOPIC
Bilirubin Urine: NEGATIVE
Glucose, UA: NEGATIVE mg/dL
Ketones, ur: NEGATIVE mg/dL
Nitrite: NEGATIVE
Protein, ur: NEGATIVE mg/dL
Specific Gravity, Urine: 1.002 — ABNORMAL LOW (ref 1.005–1.030)
pH: 6 (ref 5.0–8.0)

## 2019-09-28 LAB — CBC
HCT: 41.9 % (ref 39.0–52.0)
Hemoglobin: 13.3 g/dL (ref 13.0–17.0)
MCH: 28.8 pg (ref 26.0–34.0)
MCHC: 31.7 g/dL (ref 30.0–36.0)
MCV: 90.7 fL (ref 80.0–100.0)
Platelets: 292 10*3/uL (ref 150–400)
RBC: 4.62 MIL/uL (ref 4.22–5.81)
RDW: 13.2 % (ref 11.5–15.5)
WBC: 13.4 10*3/uL — ABNORMAL HIGH (ref 4.0–10.5)
nRBC: 0 % (ref 0.0–0.2)

## 2019-09-28 LAB — POC SARS CORONAVIRUS 2 AG -  ED: SARS Coronavirus 2 Ag: NEGATIVE

## 2019-09-28 MED ORDER — CEPHALEXIN 500 MG PO CAPS: 500.0000 mg | ORAL_CAPSULE | Freq: Three times a day (TID) | ORAL | 0 refills | Status: AC

## 2019-09-28 MED ORDER — SODIUM CHLORIDE 0.9 % IV BOLUS
1000.0000 mL | Freq: Once | INTRAVENOUS | Status: AC
  Administered 2019-09-28: 1000 mL via INTRAVENOUS

## 2019-09-28 MED ORDER — SODIUM CHLORIDE 0.9 % IV SOLN
2.0000 g | Freq: Once | INTRAVENOUS | Status: AC
  Administered 2019-09-28: 2 g via INTRAVENOUS
  Filled 2019-09-28: qty 20

## 2019-09-28 NOTE — Discharge Instructions (Addendum)
You were evaluated in the Emergency Department and after careful evaluation, we did not find any emergent condition requiring admission or further testing in the hospital.  Your exam/testing today is overall reassuring.  Your symptoms seem to be due to a urinary tract infection.  Please take the antibiotics as directed and follow-up with your regular doctors.  Please return to the Emergency Department if you experience any worsening of your condition.  We encourage you to follow up with a primary care provider.  Thank you for allowing [place] to be a part of your care.

## 2019-09-28 NOTE — ED Notes (Signed)
Pt in with portable Xray

## 2019-09-28 NOTE — ED Provider Notes (Signed)
[facility] Emergency Department Provider Note MRN:  [id number]  Arrival date & time: 09/28/19     Chief Complaint   Fever   History of Present Illness   Robert [name] is a 73 y.o. year-old male with a history of CVA, BPH presenting to the ED with chief complaint of fever.  Began experiencing subjective fever earlier today.  Occasional shaking chills.  Denies cough, no nasal congestion, no sore throat, no neck pain, no chest pain, no shortness of breath, no abdominal pain, no other complaints other than general malaise.  Thinks it is a UTI.  Review of Systems  A complete 10 system review of systems was obtained and all systems are negative except as noted in the HPI and PMH.   Patient's Health History    Past Medical History:  Diagnosis Date  . BPH (benign prostatic hyperplasia)   . 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 Diverticulosis   . Frequency of urination   . GERD (gastroesophageal reflux disease)   . Gross hematuria   . History of acute pyelonephritis    10-13-2012  . History of CVA with residual deficit    2008--  left side of body weakness and foot drop (wears leg brace and uses cane)  . History of DVT of lower extremity    2008--  cva  . Hypercholesteremia   . Hyperlipidemia   . Hyperlipidemia   . Hypertension   . 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 Left leg DVT ([place])   . 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 Urethral stricture   . Urgency of urination   . Urinary retention   . Weakness of left side of body    secondary to cva 2008  . Wears glasses   . Wears hearing aid    bilateral-- wears intermittantly    Past Surgical History:  Procedure Laterality Date  . CYSTOSCOPY WITH RETROGRADE URETHROGRAM N/A 10/23/2015   Procedure: CYSTOSCOPY WITH RETROGRADE URETHROGRAM;  Surgeon: [name], MD;  Location: [facility];  Service: Urology;  Laterality:  N/A;  . CYSTOSCOPY WITH URETHRAL DILATATION N/A 10/23/2015   Procedure: CYSTOSCOPY WITH URETHRAL BALLOON DILATATION;  Surgeon: [name], MD;  Location: [facility];  Service: Urology;  Laterality: N/A;  BALLOON DILATION   . IVC FILTER PLACEMENT ([place] HX)  2008    Family History  Problem Relation Age of Onset  . Diabetes Sister   . Lung cancer Brother        Post 9/11 voluntary work in [place]  . Prostate cancer Brother   . Other Mother        passed away young- non medical  . Alzheimer's disease Father   . Healthy Son     Social History   Socioeconomic History  . Marital status: Married    Spouse name: [name]  . Number of children: 1  . Years of education: 75  . Highest education level: High school graduate  Occupational History  . Occupation: retired    Comment: Public librarian  Social Needs  . Financial resource strain: Not on file  . Food insecurity    Worry: Sometimes true    Inability: Sometimes true  . Transportation needs    Medical: No    Non-medical: No  Tobacco Use  . Smoking status: Former Smoker    Years: 10.00  Types: Cigarettes    Quit date: 10/26/1970    Years since quitting: 48.9  . Smokeless tobacco: Never Used  Substance and Sexual Activity  . Alcohol use: No  . Drug use: No  . Sexual activity: Not on file  Lifestyle  . Physical activity    Days per week: Not on file    Minutes per session: Not on file  . Stress: Not on file  Relationships  . Social Herbalist on phone: Not on file    Gets together: Not on file    Attends religious service: Not on file    Active member of club or organization: Not on file    Attends meetings of clubs or organizations: Not on file    Relationship status: Not on file  . Intimate partner violence    Fear of current or ex partner: Not on file    Emotionally abused: Not on file    Physically abused: Not on file    Forced sexual activity: Not on file  Other Topics Concern  . Not on  file  Social History Narrative   Lives in [place] with wife. He is a English as a second language teacher.   Has one son who lives in [place]. He is healthy.   [facility] [place], [place].      Patient is right-handed. He lives with his wife in a one level home.     Physical Exam  Vital Signs and Nursing Notes reviewed Vitals:   09/28/19 1919 09/28/19 1921  BP: 132/67   Pulse: (!) 103   Resp: (!) 23   Temp: (!) 101 F (38.3 C)   SpO2: 98% 95%    CONSTITUTIONAL: Well-appearing, NAD NEURO:  Alert and oriented x 3, no focal deficits EYES:  eyes equal and reactive ENT/NECK:  no LAD, no JVD CARDIO: Tachycardic rate, well-perfused, normal S1 and S2 PULM:  CTAB no wheezing or rhonchi GI/GU:  normal bowel sounds, non-distended, non-tender MSK/SPINE:  No gross deformities, no edema SKIN:  no rash, atraumatic PSYCH:  Appropriate speech and behavior  Diagnostic and Interventional Summary    EKG Interpretation  Date/Time:    Ventricular Rate:    PR Interval:    QRS Duration:   QT Interval:    QTC Calculation:   R Axis:     Text Interpretation:        Labs Reviewed  CBC - Abnormal; Notable for the following components:      Result Value   WBC 13.4 (*)    All other components within normal limits  COMPREHENSIVE METABOLIC PANEL - Abnormal; Notable for the following components:   Sodium 134 (*)    Glucose, Bld 137 (*)    Albumin 3.3 (*)    AST 12 (*)    All other components within normal limits  URINALYSIS, ROUTINE W REFLEX MICROSCOPIC - Abnormal; Notable for the following components:   APPearance HAZY (*)    Specific Gravity, Urine 1.002 (*)    Hgb urine dipstick LARGE (*)    Leukocytes,Ua LARGE (*)    Bacteria, UA RARE (*)    All other components within normal limits  CULTURE, BLOOD (SINGLE)  URINE CULTURE  LACTIC ACID, PLASMA  POC SARS CORONAVIRUS 2 AG -  ED    XR Chest Single View  Final Result      Medications  sodium chloride 0.9 % bolus 1,000 mL (1,000  mLs Intravenous New Bag/Given (Non-Interop) 09/28/19 2113)  cefTRIAXone (ROCEPHIN)  2 g in sodium chloride 0.9 % 100 mL IVPB (0 g Intravenous Stopped 09/28/19 2155)     Procedures  /  Critical Care Procedures  ED Course and Medical Decision Making  I have reviewed the triage vital signs and the nursing notes.  Pertinent labs & imaging results that were available during my care of the patient were reviewed by me and considered in my medical decision making (see below for details).     Suspect UTI as source of patient's fever.  No other symptoms.  Mildly tachycardic.  Early sepsis possible.  Will provide fluids, antibiotics, screen with labs and lactate.  Upon reassessment patient's heart rate is in the 70s after IV fluids.  Labs are reassuring, mild leukocytosis, lactate is normal.  Patient is feeling well.  Urinalysis showing signs of infection.  Appropriate for trial of outpatient antibiotics.  Barth Kirks. Sedonia Small, [place] [email or web address]  Final Clinical Impressions(s) / ED Diagnoses     ICD-10-CM   1. Lower urinary tract infectious disease  N39.0   2. Fever  R50.9 XR Chest Single View    XR Chest Single View    ED Discharge Orders         Ordered    cephALEXin (KEFLEX) 500 MG capsule  3 times daily     09/28/19 2302           Discharge Instructions Discussed with and Provided to Patient:     Discharge Instructions     You were evaluated in the Emergency Department and after careful evaluation, we did not find any emergent condition requiring admission or further testing in the hospital.  Your exam/testing today is overall reassuring.  Your symptoms seem to be due to a urinary tract infection.  Please take the antibiotics as directed and follow-up with your regular doctors.  Please return to the Emergency Department if you experience any worsening of your condition.  We encourage you to follow up with a primary care  provider.  Thank you for allowing [place] to be a part of your care.       Maudie Flakes, MD 09/28/19 [phone]

## 2019-09-28 NOTE — ED Triage Notes (Signed)
73 yo male BIB [facility] from home for complaint of fever. Pt wife states he has had fever since this morning 103.0, wife states she gave him tylenol this morning and temp decreased but has since creeped back up. No other complaints. Pt has been at home with limited contact with anyone outside of his immediate household. Pt has history of CVA left side residual weakness, pt is non ambulatory. Pt also has history of parkinson disease but wife states his tremors have been increased with the fever. Pt is AOx4 as per EMS.

## 2019-09-28 NOTE — ED Notes (Signed)
Pt states he had a urinary tract infection two weeks ago and finished his antibiotics about a week ago. Pt states "I don't think those pills worked for me". Pt has an indwelling catheter. Pt denies any pain or discomfort associated with his catheter and and denies any urinary complaints.

## 2019-09-29 DIAGNOSIS — R279 Unspecified lack of coordination: Secondary | ICD-10-CM | POA: Diagnosis not present

## 2019-09-29 DIAGNOSIS — R5381 Other malaise: Secondary | ICD-10-CM | POA: Diagnosis not present

## 2019-09-29 DIAGNOSIS — Z743 Need for continuous supervision: Secondary | ICD-10-CM | POA: Diagnosis not present

## 2019-10-01 LAB — URINE CULTURE: Culture: >=100000 — AB

## 2019-10-02 ENCOUNTER — Telehealth: Payer: Self-pay | Admitting: Emergency Medicine

## 2019-10-02 DIAGNOSIS — N390 Urinary tract infection, site not specified: Secondary | ICD-10-CM | POA: Diagnosis not present

## 2019-10-02 DIAGNOSIS — R339 Retention of urine, unspecified: Secondary | ICD-10-CM | POA: Diagnosis not present

## 2019-10-02 DIAGNOSIS — I82402 Acute embolism and thrombosis of unspecified deep veins of left lower extremity: Secondary | ICD-10-CM | POA: Diagnosis not present

## 2019-10-02 NOTE — Telephone Encounter (Signed)
Post ED Visit - Positive Culture Follow-up: Successful Patient Follow-Up  Culture assessed and recommendations reviewed by:  []  Elenor Quinones, Pharm.D. []  Heide Guile, Pharm.D., BCPS AQ-ID []  Parks Neptune, Pharm.D., BCPS []  Alycia Rossetti, Pharm.D., BCPS []  [place], Pharm.D., BCPS, AAHIVP []  Legrand [place], Pharm.D., BCPS, AAHIVP []  Salome Arnt, PharmD, BCPS []  Johnnette Gourd, PharmD, BCPS []  Hughes Better, PharmD, BCPS []  Leeroy Cha, PharmD Dia Sitter PharmD  Positive urine culture  []  Patient discharged without antimicrobial prescription and treatment is now indicated []  Organism is resistant to prescribed ED discharge antimicrobial []  Patient with positive blood cultures  Changes discussed with ED provider: Janeece Fitting PA New antibiotic prescription symptom check, if has urinary symptoms, add Ciprofloxacin 500mg  po bid x 7 days, states symptoms have resolved so to continue  cephalexin until gone  Spoke with wife who states symptoms have improved   Hazle Nordmann 10/02/2019, 12:33 PM

## 2019-10-02 NOTE — Progress Notes (Signed)
ED Antimicrobial Stewardship Positive Culture Follow Up   Robert [name] is an 73 y.o. male who presented to [facility] on 09/28/2019 with a chief complaint of fever.  Chief Complaint  Patient presents with  . Fever    Recent Results (from the past 720 hour(s))  Blood Culture x 1     Status: None (Preliminary result)   Collection Time: 09/28/19  8:52 PM   Specimen: BLOOD  Result Value Ref Range Status   Specimen Description   Final    BLOOD RIGHT ANTECUBITAL Performed at [facility] [address] [place], [place] [address]    Special Requests   Final    BOTTLES DRAWN AEROBIC AND ANAEROBIC Blood Culture adequate volume Performed at [facility] [address] [place], [place] [address]    Culture   Final    NO GROWTH 3 DAYS Performed at [facility], [place] [address], [place], [place] [address]    Report Status PENDING  Incomplete  Urine culture     Status: Abnormal   Collection Time: 09/28/19  8:52 PM   Specimen: Urine, Clean Catch  Result Value Ref Range Status   Specimen Description   Final    URINE, CLEAN CATCH Performed at [facility], [employer] [address], [place], [place] [address]    Special Requests   Final    NONE Performed at [facility], [place] [address], [place], [place] [address]    Culture (A)  Final    >=100,000 COLONIES/mL STAPHYLOCOCCUS AUREUS 50,000 COLONIES/mL PSEUDOMONAS AERUGINOSA    Report Status 10/01/2019 FINAL  Final   Organism ID, Bacteria STAPHYLOCOCCUS AUREUS (A)  Final   Organism ID, Bacteria PSEUDOMONAS AERUGINOSA (A)  Final      Susceptibility   Pseudomonas aeruginosa - MIC*    CEFTAZIDIME 2 SENSITIVE Sensitive     CIPROFLOXACIN <=0.25 SENSITIVE Sensitive     GENTAMICIN <=1 SENSITIVE Sensitive     IMIPENEM 2 SENSITIVE Sensitive     PIP/TAZO 8 SENSITIVE Sensitive     CEFEPIME <=1 SENSITIVE Sensitive     * 50,000 COLONIES/mL PSEUDOMONAS AERUGINOSA   Staphylococcus aureus - MIC*    CIPROFLOXACIN <=0.5 SENSITIVE Sensitive     GENTAMICIN <=0.5 SENSITIVE Sensitive     NITROFURANTOIN <=16 SENSITIVE Sensitive     OXACILLIN 0.5 SENSITIVE Sensitive     TETRACYCLINE <=1 SENSITIVE Sensitive     VANCOMYCIN <=0.5 SENSITIVE Sensitive     TRIMETH/SULFA <=10 SENSITIVE Sensitive     CLINDAMYCIN <=0.25 SENSITIVE Sensitive     RIFAMPIN <=0.5 SENSITIVE Sensitive     Inducible Clindamycin NEGATIVE Sensitive     * >=100,000 COLONIES/mL STAPHYLOCOCCUS AUREUS    Plan: 1) continue cephalexin (prescribed on 12/14) for MSSA UTI 2) Ask patient if he has urinary symptoms. If symptomatic, then start ciprofloxacin 500 mg PO twice daily for 7 days.  This is, in addition to the cephalexin, to treat for pseudomonas.  ED Provider: Janeece Fitting, PA-C   Lynelle Doctor 10/02/2019, 9:31 AM Clinical Pharmacist [phone]

## 2019-10-03 LAB — CULTURE, BLOOD (SINGLE)
Culture: NO GROWTH
Special Requests: ADEQUATE

## 2019-10-04 ENCOUNTER — Ambulatory Visit: Payer: Medicare Other

## 2019-10-04 ENCOUNTER — Other Ambulatory Visit: Payer: Self-pay | Admitting: *Deleted

## 2019-10-04 NOTE — Patient Outreach (Signed)
[facility] [facility]) Care Management  10/04/2019  Robert [name] 1946/02/14 [id number]   Call placed to member's caregiver/wife, she report this is not a good time to talk.  Request she call this care manager back when she is available.  If no call back, will follow up within the next 3-4 business days.  Robert [name], [place], MSN [place] [phone]

## 2019-10-10 ENCOUNTER — Other Ambulatory Visit: Payer: Self-pay | Admitting: *Deleted

## 2019-10-10 NOTE — Patient Outreach (Signed)
[place]) Care Management  10/10/2019  Robert [name] [birth year] [id number]   Outreach attempt #2, successful.  Weekly transition of care call placed to member's caregiver/wife, [name].  She report member is still slowing improving, stating "It's taking some time."  He was seen in ED on 12/3 for symptoms of UTI, report catheter was changed. State the old catheter had "pus" at the end of the tube.  He has taken his course of antibiotics, denies fever since ED visit.  He has follow up appointment with urologist on Monday, will have more testing done to determine the cause of his retention.  He had a virtual visit completed with PCP office last week.    She report member is still working with PT but having somewhat of a difficult time as his legs remain swollen.  State he has fluid on his right leg and a blood clot in his left leg.  He is on Eliquis for history of clots and had IVC filter placed in 2008.  She state he is continuing to push through to optimize his PT sessions.    Wife denies any urgent concerns at this time.  State her biggest need right now is to have some relief.  They don't qualify for any programs due to their income but they are also not able to pay out of pocket for services.  Report she will continue to "do the best I can."  Advised to contact this care manager with questions/concerns, will follow up within the next month.  THN CM Care Plan Problem One     Most Recent Value  Care Plan Problem One  Risk for readmission related to weakness as evidenced by recent hospitalization requiring rehab stay  Role Documenting the Problem One  Care Management [place] for Problem One  Active  [facility] Long Term Goal   Member will not be readmitted to hospital within the next 31 days  THN Long Term Goal Start Date  09/12/19  [facility] Long Term Goal Met Date  10/10/19  [facility] CM Short Term Goal #2   Wife will report attendance to [place] MD visit within the next week  [facility]  CM Short Term Goal #2 Start Date  09/25/19  [facility] CM Short Term Goal #2 Met Date  10/10/19  THN CM Short Term Goal #3  Wife will report no signs/symptoms of infection over the next 3 weeks  THN CM Short Term Goal #3 Start Date  10/10/19 [Date reset]  Interventions for Short Tern Goal #3  Wife educated on importance of following up with urologist and having catheter changed on a regular basis     [name], Therapist, sports, MSN [place] Manager [phone]

## 2019-10-12 ENCOUNTER — Ambulatory Visit: Payer: Medicare Other | Admitting: *Deleted

## 2019-10-13 ENCOUNTER — Other Ambulatory Visit: Payer: Self-pay | Admitting: Neurology

## 2019-10-16 DIAGNOSIS — R339 Retention of urine, unspecified: Secondary | ICD-10-CM | POA: Diagnosis not present

## 2019-11-07 ENCOUNTER — Other Ambulatory Visit: Payer: Self-pay | Admitting: Pharmacist

## 2019-11-07 DIAGNOSIS — N400 Enlarged prostate without lower urinary tract symptoms: Secondary | ICD-10-CM | POA: Diagnosis not present

## 2019-11-07 DIAGNOSIS — R339 Retention of urine, unspecified: Secondary | ICD-10-CM | POA: Diagnosis not present

## 2019-11-07 DIAGNOSIS — Z86718 Personal history of other venous thrombosis and embolism: Secondary | ICD-10-CM | POA: Diagnosis not present

## 2019-11-07 NOTE — Patient Outreach (Addendum)
[facility]) Care Management  11/07/2019  Robert [name] 03-05-1946 [id number]  Called patient to follow up on Medicare Part D as he just signed up for it last year and it became active October 27, 2019.  Patient's wife answered the phone. She verified HIPAA x 2 but said the patient had not had any medications filled yet to be sure things were billed correctly.   Patient saw his PCP this morning. His wife reported the appointment went well but said she could not review his medications at the time of my call because she was not at home.  We assisted the patient with a [place] application for Eliquis at the end of the year ( forms were actually sent in by his provider).  Patient's wife said they received a denial letter from [employer] for LIS/Extra Help.   Plan: Call patient back in 4-6 weeks.   [name], PharmD, [place] Clinical Pharmacist [phone]

## 2019-11-09 ENCOUNTER — Other Ambulatory Visit: Payer: Self-pay | Admitting: *Deleted

## 2019-11-09 NOTE — Patient Outreach (Signed)
[place] [facility]) Care Management  11/09/2019  Robert [name] 08/12/1946 [id number]   Call placed to member's wife to follow up on management of care. She report member is "fine."  Encouraged to elaborate, state he still has the catheter but showing no signs of infection.  He has appointment with urologist next week, unsure if member will have to keep the catheter.  She feel member is not as mobile as he could be due to the catheter as well as swelling of the legs due to fluid and blood clot.  He is no longer being seen by PT but she is encouraged to continue exercises that were taught.  Report vitals are stable, denies any urgent concerns.  Will follow up within the next month.  THN CM Care Plan Problem One     Most Recent Value  Care Plan Problem One  Risk for fall related to decreased strength and weakness reported by wife  Role Documenting the Problem One  Care Management [place] for Problem One  Active  THN Long Term Goal   Member/wife will report no falls over the next 45 days  THN Long Term Goal Start Date  11/09/19  Interventions for Problem One Long Term Goal  Interventions for fall risk reviewed with member and wife, advised to continue therapy exercises with member to improve strength   THN CM Short Term Goal #1   Member/wife will report attending follow up with urologist within the next 2 weeks  THN CM Short Term Goal #1 Start Date  11/09/19  Interventions for Short Term Goal #1  Upcoming appointment reviewed with wife, aware of importance of follow up in effort to assess catheter and decrease risk of infection.  THN CM Short Term Goal #2   Wife will report plan to remove catheter within the next 4 weeks  THN CM Short Term Goal #2 Start Date  11/09/19  Interventions for Short Term Goal #2  Discussed catheter as being barrier to member being more mobile.  Advised to discuss concern with MD as well as interventions to improve mobility despite catheter.      Robert [name], [place], MSN [place] [phone]

## 2019-11-15 ENCOUNTER — Telehealth: Payer: Self-pay | Admitting: Neurology

## 2019-11-15 ENCOUNTER — Other Ambulatory Visit: Payer: Self-pay

## 2019-11-15 MED ORDER — NUPLAZID 34 MG PO CAPS: 1.0000 | ORAL_CAPSULE | Freq: Every day | ORAL | 5 refills | Status: DC

## 2019-11-15 NOTE — Telephone Encounter (Signed)
Patient's wife called to follow up about a telephone call earlier today regarding Nuplazid. She said she called too early. She said he doesn't need new authorization until 05/29/20.

## 2019-11-15 NOTE — Telephone Encounter (Signed)
Patient's wife called regarding his Nuplazid medication. He normally will get it through the company but it will expire soon and she would like to have his most recent office notes as well as a New Rx faxed to the [place] in [place] so they can start filling the medication (it will be much cheaper). The Fax # is [phone]. He sees Dr. Marjo Bicker. Thank you

## 2019-11-15 NOTE — Telephone Encounter (Signed)
I will cancel order on Dr Arturo Morton desk now then.

## 2019-11-15 NOTE — Telephone Encounter (Signed)
He is on nuplazid, 34 mg daily (it comes through a central pharmacy).  I cannot write for it via the [place].  They don't take our scripts.  Sometimes, I can hand write a RX and their doctor will re-write it and will process via the [place] med center.  She can let us know if she wants [place] to write it that way and she can pick it up and try to get it via the [place].   Re: records.  They have to go through medical records.  Can get the number from front desk

## 2019-11-15 NOTE — Telephone Encounter (Signed)
Patients wife stated that she wants the prescription faxed the [place] regardless. She said she will call them after its been sent to see if they will proceed. RX printed. Medical records number given to wife

## 2019-11-15 NOTE — Telephone Encounter (Signed)
Nuplazid is not on medication list and I see it discussed in the last OV but no instructions or mg mentioned. Please advise

## 2019-11-15 NOTE — Addendum Note (Signed)
Addended by: Drucilla Schmidt on: 11/15/2019 03:02 PM   Modules accepted: Orders

## 2019-11-16 ENCOUNTER — Telehealth: Payer: Self-pay | Admitting: Neurology

## 2019-11-16 DIAGNOSIS — R8271 Bacteriuria: Secondary | ICD-10-CM | POA: Diagnosis not present

## 2019-11-16 DIAGNOSIS — R338 Other retention of urine: Secondary | ICD-10-CM | POA: Diagnosis not present

## 2019-11-16 NOTE — Telephone Encounter (Signed)
Valinda Hoar is calling in about the patient assistant program and the medication is out of refills. Send over prescription or call and give verbal for nuplazid. Fax: [phone]. Thanks!

## 2019-11-16 NOTE — Telephone Encounter (Signed)
Okay to refill/reauthorize but see phone call yesterday.  Wife just said she didn't need it until august?

## 2019-11-17 DIAGNOSIS — N401 Enlarged prostate with lower urinary tract symptoms: Secondary | ICD-10-CM | POA: Diagnosis not present

## 2019-11-17 DIAGNOSIS — R3914 Feeling of incomplete bladder emptying: Secondary | ICD-10-CM | POA: Diagnosis not present

## 2019-11-17 DIAGNOSIS — N3020 Other chronic cystitis without hematuria: Secondary | ICD-10-CM | POA: Diagnosis not present

## 2019-11-17 NOTE — Telephone Encounter (Signed)
A request is to come in today.

## 2019-11-21 ENCOUNTER — Other Ambulatory Visit: Payer: Self-pay | Admitting: Neurology

## 2019-12-11 DIAGNOSIS — R338 Other retention of urine: Secondary | ICD-10-CM | POA: Diagnosis not present

## 2019-12-14 ENCOUNTER — Other Ambulatory Visit: Payer: Self-pay | Admitting: *Deleted

## 2019-12-14 NOTE — Patient Outreach (Signed)
[place] [facility]) Care Management  12/14/2019  Robert [name] [birth year] [id number]   Call placed to member's caregiver/wife [name] to follow up on member's continued recovery.  She report member did follow up with urologist last week but still has the catheter in place for retention.  State he is scheduled for a urolift procedure within the next couple weeks, hoping this well work to treat the retention so he can have the catheter removed.  He had to provide a urine sample to determine if he has a UTI prior to procedure, will be placed on antibiotics before procedure if he is positive.  Denies concerns otherwise, will follow up within the next month.  THN CM Care Plan Problem One     Most Recent Value  Care Plan Problem One  Risk for fall related to decreased strength and weakness reported by wife  Role Documenting the Problem One  Care Management [name] for Problem One  Active  THN Long Term Goal   Member/wife will report no falls over the next 45 days  THN Long Term Goal Start Date  11/09/19  Interventions for Problem One Long Term Goal  Reviewed fall risk with wife, encouraged to continue to have member try to increase mobility as tolerated  THN CM Short Term Goal #1   Member/wife will report attending follow up with urologist within the next 2 weeks  THN CM Short Term Goal #1 Start Date  11/09/19  [facility] CM Short Term Goal #1 Met Date  12/14/19  THN CM Short Term Goal #2   Wife will report plan to remove catheter within the next 4 weeks  THN CM Short Term Goal #2 Start Date  11/09/19  Interventions for Short Term Goal #2  Discussed updated plan to remove catheter, including procedure scheduled for next week.     [name], [place], MSN [place] [phone]

## 2019-12-18 DIAGNOSIS — N401 Enlarged prostate with lower urinary tract symptoms: Secondary | ICD-10-CM | POA: Diagnosis not present

## 2019-12-18 DIAGNOSIS — R3914 Feeling of incomplete bladder emptying: Secondary | ICD-10-CM | POA: Diagnosis not present

## 2019-12-20 ENCOUNTER — Other Ambulatory Visit: Payer: Self-pay | Admitting: Pharmacist

## 2019-12-21 DIAGNOSIS — R338 Other retention of urine: Secondary | ICD-10-CM | POA: Diagnosis not present

## 2019-12-21 DIAGNOSIS — N401 Enlarged prostate with lower urinary tract symptoms: Secondary | ICD-10-CM | POA: Diagnosis not present

## 2019-12-23 ENCOUNTER — Other Ambulatory Visit: Payer: Self-pay | Admitting: Neurology

## 2019-12-23 NOTE — Patient Outreach (Signed)
[facility]) Care Management  12/23/2019  Reznor Banka 05-Nov-1945 [id number]   LATE ENTRY FOR 12/20/2019  Called patient. Spoke with his wife, [name]. HIPAA identifiers were obtained. [name] said she was in the car at the time of my call but that they did not have any questions about the patient's medications or cost.  Plan: Close patient's case. He was assisted with med assistance last year. He was uninsured then but has since singed up for Medicare Part D.  [name], PharmD, [place] Clinical Pharmacist [phone]

## 2019-12-25 NOTE — Telephone Encounter (Signed)
Rx(s) sent to pharmacy electronically.  

## 2020-01-01 DIAGNOSIS — N401 Enlarged prostate with lower urinary tract symptoms: Secondary | ICD-10-CM | POA: Diagnosis not present

## 2020-01-01 DIAGNOSIS — R338 Other retention of urine: Secondary | ICD-10-CM | POA: Diagnosis not present

## 2020-01-05 DIAGNOSIS — N401 Enlarged prostate with lower urinary tract symptoms: Secondary | ICD-10-CM | POA: Diagnosis not present

## 2020-01-05 DIAGNOSIS — R338 Other retention of urine: Secondary | ICD-10-CM | POA: Diagnosis not present

## 2020-01-05 DIAGNOSIS — N402 Nodular prostate without lower urinary tract symptoms: Secondary | ICD-10-CM | POA: Diagnosis not present

## 2020-01-09 ENCOUNTER — Other Ambulatory Visit: Payer: Self-pay | Admitting: *Deleted

## 2020-01-09 NOTE — Patient Outreach (Signed)
[facility]) Care Management  01/09/2020  Robert [name] 11-Feb-1946 [id number]   Call placed to member's wife to follow up on his recent visit with urology.  She report his procedure was successful however he still has the catheter in place.  State she was told due to his parkinson's, the predicted outcome of the procedure would be slow.  He has a follow up appointment on 3/26 to remove catheter, was given a medication that he is to take the day before appointment that is supposed to "kickstart" his prostate/bladder in advance.  She report member also has appointment for his Covid shot on 3/31.  State his vitals (blood pressure, heart rate) have remained stable, denies any urgent concerns at this time.  Discussed ongoing involvement with [facility], agrees to disease management.  Will place referral to health coach and notify PCP of transition.  [facility] CM Care Plan Problem One     Most Recent Value  Care Plan Problem One  Risk for fall related to decreased strength and weakness reported by wife  Role Documenting the Problem One  Care Management [place] for Problem One  Not Active  [facility] Long Term Goal   Member/wife will report no falls over the next 45 days  [facility] Long Term Goal Start Date  11/09/19  [facility] Long Term Goal Met Date  01/09/20  [facility] CM Short Term Goal #1   Member/wife will report attending follow up with urologist within the next 2 weeks  [facility] CM Short Term Goal #1 Start Date  11/09/19  [facility] CM Short Term Goal #1 Met Date  12/14/19  [facility] CM Short Term Goal #2   Wife will report plan to remove catheter within the next 4 weeks  [facility] CM Short Term Goal #2 Start Date  11/09/19  [facility] CM Short Term Goal #2 Met Date  01/09/20     [name], RN, MSN [place] [phone]

## 2020-01-12 ENCOUNTER — Other Ambulatory Visit: Payer: Self-pay | Admitting: *Deleted

## 2020-01-16 ENCOUNTER — Ambulatory Visit: Payer: Medicare Other | Admitting: Neurology

## 2020-01-19 DIAGNOSIS — R338 Other retention of urine: Secondary | ICD-10-CM | POA: Diagnosis not present

## 2020-01-19 DIAGNOSIS — N401 Enlarged prostate with lower urinary tract symptoms: Secondary | ICD-10-CM | POA: Diagnosis not present

## 2020-01-23 ENCOUNTER — Ambulatory Visit: Payer: Medicare Other | Admitting: Neurology

## 2020-01-29 ENCOUNTER — Other Ambulatory Visit: Payer: Self-pay | Admitting: Neurology

## 2020-02-05 DIAGNOSIS — R338 Other retention of urine: Secondary | ICD-10-CM | POA: Diagnosis not present

## 2020-02-05 DIAGNOSIS — N401 Enlarged prostate with lower urinary tract symptoms: Secondary | ICD-10-CM | POA: Diagnosis not present

## 2020-02-07 ENCOUNTER — Other Ambulatory Visit: Payer: Self-pay | Admitting: *Deleted

## 2020-02-07 ENCOUNTER — Emergency Department (HOSPITAL_COMMUNITY)
Admission: EM | Admit: 2020-02-07 | Discharge: 2020-02-07 | Disposition: A | Discharge status: Home / Self Care | Payer: Medicare Other | Attending: Emergency Medicine | Admitting: Emergency Medicine

## 2020-02-07 ENCOUNTER — Other Ambulatory Visit: Payer: Self-pay

## 2020-02-07 ENCOUNTER — Emergency Department (HOSPITAL_BASED_OUTPATIENT_CLINIC_OR_DEPARTMENT_OTHER): Payer: Medicare Other

## 2020-02-07 ENCOUNTER — Encounter (HOSPITAL_COMMUNITY): Payer: Self-pay | Admitting: Emergency Medicine

## 2020-02-07 DIAGNOSIS — R338 Other retention of urine: Secondary | ICD-10-CM | POA: Diagnosis not present

## 2020-02-07 DIAGNOSIS — Z87891 Personal history of nicotine dependence: Secondary | ICD-10-CM | POA: Insufficient documentation

## 2020-02-07 DIAGNOSIS — L03114 Cellulitis of left upper limb: Secondary | ICD-10-CM | POA: Diagnosis not present

## 2020-02-07 DIAGNOSIS — I10 Essential (primary) hypertension: Secondary | ICD-10-CM | POA: Diagnosis not present

## 2020-02-07 DIAGNOSIS — Z86718 Personal history of other venous thrombosis and embolism: Secondary | ICD-10-CM | POA: Insufficient documentation

## 2020-02-07 DIAGNOSIS — L0390 Cellulitis, unspecified: Secondary | ICD-10-CM

## 2020-02-07 DIAGNOSIS — L03116 Cellulitis of left lower limb: Secondary | ICD-10-CM | POA: Diagnosis not present

## 2020-02-07 DIAGNOSIS — M7989 Other specified soft tissue disorders: Secondary | ICD-10-CM

## 2020-02-07 DIAGNOSIS — R2232 Localized swelling, mass and lump, left upper limb: Secondary | ICD-10-CM | POA: Diagnosis present

## 2020-02-07 DIAGNOSIS — N401 Enlarged prostate with lower urinary tract symptoms: Secondary | ICD-10-CM | POA: Diagnosis not present

## 2020-02-07 MED ORDER — CEPHALEXIN 500 MG PO CAPS: 500.0000 mg | ORAL_CAPSULE | Freq: Four times a day (QID) | ORAL | 0 refills | Status: DC

## 2020-02-07 MED ORDER — CEPHALEXIN 500 MG PO CAPS
500.0000 mg | ORAL_CAPSULE | Freq: Once | ORAL | Status: AC
  Administered 2020-02-07: 500 mg via ORAL
  Filled 2020-02-07: qty 1

## 2020-02-07 NOTE — Patient Outreach (Signed)
[facility]) Care Management  02/07/2020  Robert [name] 1946/05/13 [id number]   RN Health Coach Initial Assessment  Referral Date:  01/09/2020 Referral Source:  Transfer from [place] Reason for Referral:  Continued Disease Management Education Insurance:  Medicare   Outreach Attempt:  Outreach attempt #1 to patient's wife for introduction, initial telephone assessment, and follow up primary care referral for social resources for caregiver support.  Patient's wife answered and stated patient in emergency room at this time.  Requested telephone call back.  Plan: RN Health Coach will await notes from emergency room for follow up.  RN Health Coach will make another outreach attempt within the month of April if patient not admitted to hospital at this time.  Robert [name] [phone] [name]

## 2020-02-07 NOTE — ED Provider Notes (Signed)
[facility] Emergency Department Provider Note MRN:  [id number]  Arrival date & time: 02/07/20     Chief Complaint   Arm Swelling   History of Present Illness   Robert [name] is a 74 y.o. year-old male with a history of CVA, DVT presenting to the ED with chief complaint of arm swelling.  Patient received the first maternal vaccination on 3/31.  Has been noticing pain, swelling, redness to the left upper arm for the past 2 days.  Has a history of blood clot in the leg, has been taking Eliquis for the past year, denies any missed doses.  Denies any chest pain or shortness of breath, no fever, no other complaints.  No trauma to the arm.  Pain is mild, constant, worse with palpation.  Review of Systems  A complete 10 system review of systems was obtained and all systems are negative except as noted in the HPI and PMH.   Patient's Health History    Past Medical History:  Diagnosis Date  . BPH (benign prostatic hyperplasia)   . 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 Diverticulosis   . Frequency of urination   . GERD (gastroesophageal reflux disease)   . Gross hematuria   . History of acute pyelonephritis    10-13-2012  . History of CVA with residual deficit    2008--  left side of body weakness and foot drop (wears leg brace and uses cane)  . History of DVT of lower extremity    2008--  cva  . Hypercholesteremia   . Hyperlipidemia   . Hyperlipidemia   . Hypertension   . 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 Urethral stricture   . Urgency of urination   . Urinary retention   . Weakness of left side of body    secondary to cva 2008  . Wears glasses   . Wears hearing aid    bilateral-- wears intermittantly    Past Surgical History:  Procedure Laterality Date  . CYSTOSCOPY WITH RETROGRADE URETHROGRAM N/A 10/23/2015    Procedure: CYSTOSCOPY WITH RETROGRADE URETHROGRAM;  Surgeon: [name], MD;  [facility];  Service: Urology;  Laterality: N/A;  . CYSTOSCOPY WITH URETHRAL DILATATION N/A 10/23/2015   Procedure: CYSTOSCOPY WITH URETHRAL BALLOON DILATATION;  Surgeon: [name], MD;  Location: [facility];  Service: Urology;  Laterality: N/A;  BALLOON DILATION   . IVC FILTER PLACEMENT ([place] HX)  2008    Family History  Problem Relation Age of Onset  . Diabetes Sister   . Lung cancer Brother        Post 9/11 voluntary work in [place]  . Prostate cancer Brother   . Other Mother        passed away young- non medical  . Alzheimer's disease Father   . Healthy Son     Social History   Socioeconomic History  . Marital status: Married    Spouse name: [name]  . Number of children: 1  . Years of education: 62  . Highest education level: High school graduate  Occupational History  . Occupation: retired    Comment: Public librarian  Tobacco Use  . Smoking status: Former Smoker    Years: 10.00    Types: Cigarettes    Quit date: 10/26/1970  Years since quitting: 49.3  . Smokeless tobacco: Never Used  Substance and Sexual Activity  . Alcohol use: No  . Drug use: No  . Sexual activity: Not on file  Other Topics Concern  . Not on file  Social History Narrative   Lives in [place] with wife. He is a English as a second language teacher.   Has one son who lives in [place]. He is healthy.   [facility] [place] [place].      Patient is right-handed. He lives with his wife in a one level home.   Social Determinants of Health   Financial Resource Strain:   . Difficulty of Paying Living Expenses:   Food Insecurity: Food Insecurity Present  . Worried About Charity fundraiser in the Last Year: Sometimes true  . Ran Out of Food in the Last Year: Sometimes true  Transportation Needs: No Transportation Needs  . Lack of Transportation (Medical): No  . Lack of  Transportation (Non-Medical): No  Physical Activity:   . Days of Exercise per Week:   . Minutes of Exercise per Session:   Stress:   . Feeling of Stress :   Social Connections:   . Frequency of Communication with Friends and Family:   . Frequency of Social Gatherings with Friends and Family:   . Attends Religious Services:   . Active Member of Clubs or Organizations:   . Attends Archivist Meetings:   Marland Kitchen Marital Status:   Intimate Partner Violence:   . Fear of Current or Ex-Partner:   . Emotionally Abused:   Marland Kitchen Physically Abused:   . Sexually Abused:      Physical Exam   Vitals:   02/07/20 1835 02/07/20 1900  BP: (!) 148/90 126/70  Pulse:  68  Resp: 15 16  Temp:    SpO2: 100% 98%    CONSTITUTIONAL: Well-appearing, NAD NEURO:  Alert and oriented x 3, no focal deficits EYES:  eyes equal and reactive ENT/NECK:  no LAD, no JVD CARDIO: Regular rate, well-perfused, normal S1 and S2 PULM:  CTAB no wheezing or rhonchi GI/GU:  normal bowel sounds, non-distended, non-tender MSK/SPINE:  No gross deformities, no edema SKIN: Erythema, tenderness, induration to the skin of the left upper arm PSYCH:  Appropriate speech and behavior  *Additional and/or pertinent findings included in MDM below  Diagnostic and Interventional Summary    EKG Interpretation  Date/Time:    Ventricular Rate:    PR Interval:    QRS Duration:   QT Interval:    QTC Calculation:   R Axis:     Text Interpretation:        Labs Reviewed - No data to display  VAS [place] UPPER EXTREMITY VENOUS DUPLEX      US Venous Img Upper Uni Left    (Results Pending)    Medications  cephALEXin (KEFLEX) capsule 500 mg (has no administration in time range)     Procedures  /  Critical Care Procedures  ED Course and Medical Decision Making  I have reviewed the triage vital signs, the nursing notes, and pertinent available records from the EMR.  Listed above are laboratory and imaging tests that I  personally ordered, reviewed, and interpreted and then considered in my medical decision making (see below for details).      Question of DVT versus cellulitis versus vaccine reaction, ultrasound is pending.  Nothing to suggest PE given lack of thoracic symptoms.  No systemic symptoms to suggest systemic infection such as  sepsis.  DVT ultrasound is negative.  Continues to look and feel well with normal vital signs, appropriate for outpatient management.  Upon reevaluation there is not a significant amount of induration but there is erythema.  The timing seems odd for an allergic reaction.  Will cover with Keflex, strict return precautions for spreading of redness, worsening pain, fever.  Barth Kirks. [place] Small, [place] [email or web address]  Final Clinical Impressions(s) / ED Diagnoses     ICD-10-CM   1. Cellulitis, unspecified cellulitis site  L03.90     ED Discharge Orders         Ordered    cephALEXin (KEFLEX) 500 MG capsule  4 times daily     02/07/20 1933           Discharge Instructions Discussed with and Provided to Patient:     Discharge Instructions     You were evaluated in the Emergency Department and after careful evaluation, we did not find any emergent condition requiring admission or further testing in the hospital.  Your exam/testing today is overall reassuring.  Your ultrasound did not show any blood clots.  We suspect that your symptoms are due to either an infection of the skin or a side effect of the vaccination.  Please take the antibiotics as directed.  We [facility] the [facility] to discuss [facility] to see if they still recommend the second dose.  Please return to the Emergency Department if you experience any worsening of your condition.  We encourage you to follow up with a primary care provider.  Thank you for allowing [place] to be a part of your care.       Maudie Flakes, MD 02/07/20 [phone]

## 2020-02-07 NOTE — Discharge Instructions (Addendum)
You were evaluated in the Emergency Department and after careful evaluation, we did not find any emergent condition requiring admission or further testing in the hospital.  Your exam/testing today is overall reassuring.  Your ultrasound did not show any blood clots.  We suspect that your symptoms are due to either an infection of the skin or a side effect of the vaccination.  Please take the antibiotics as directed.  We advise that you [facility] the [facility] to discuss the symptoms to see if they still recommend the second dose.  Please return to the Emergency Department if you experience any worsening of your condition.  We encourage you to follow up with a primary care provider.  Thank you for allowing [place] to be a part of your care.

## 2020-02-07 NOTE — ED Triage Notes (Signed)
Patient wife reports swelling to left upper arm with redness since yesterday. Had first Moderna vaccine on 3/31. Hx stroke with clot in left leg. Patient denies in arm. Denies new weakness and confusion. Hx Parkinsons

## 2020-02-12 ENCOUNTER — Telehealth: Payer: Self-pay

## 2020-02-12 NOTE — Telephone Encounter (Signed)
Received treatment Form stating more refills are needed and to complete the treatment.  Spoke with patients wife to see if any refills were needed. She stated patient has sixteen pills left and they received a call last week from [place] and a new rx will be mailed to them that they should receive this week.   I will contact Valinda Hoar to see what exactly is needed. She stated the reason Dr Tat received new form is because they switched systems and want to have the most up-to-date information.   Patients wife directly notified and voiced understanding.

## 2020-02-13 DIAGNOSIS — N400 Enlarged prostate without lower urinary tract symptoms: Secondary | ICD-10-CM | POA: Diagnosis not present

## 2020-02-13 DIAGNOSIS — G20 Parkinson's disease: Secondary | ICD-10-CM | POA: Diagnosis not present

## 2020-02-13 DIAGNOSIS — F418 Other specified anxiety disorders: Secondary | ICD-10-CM | POA: Diagnosis not present

## 2020-02-13 DIAGNOSIS — Z86718 Personal history of other venous thrombosis and embolism: Secondary | ICD-10-CM | POA: Diagnosis not present

## 2020-02-13 DIAGNOSIS — G8190 Hemiplegia, unspecified affecting unspecified side: Secondary | ICD-10-CM | POA: Diagnosis not present

## 2020-02-14 DIAGNOSIS — R338 Other retention of urine: Secondary | ICD-10-CM | POA: Diagnosis not present

## 2020-02-14 DIAGNOSIS — N401 Enlarged prostate with lower urinary tract symptoms: Secondary | ICD-10-CM | POA: Diagnosis not present

## 2020-02-15 ENCOUNTER — Telehealth: Payer: Self-pay

## 2020-02-15 NOTE — Telephone Encounter (Signed)
Received fax stating a prior Josem Kaufmann is needed for patient to continue getting Nuplazid.   Spoke with patients local pharmacy who stated the patient does not have rx coverage and uses savings cards to get his medication.   Spoke with patients wife who stated the patient uses the assistance program to get Nuplazid. She stated the patient assistance program form is not due until August. I explained to patients wife, per dpr, that as long as there is no interruption in the patients medicine then we will not worry about it until August. Wife voiced understanding and stated she does not know why a PA would be needed if the patient does not have rx coverage. Advised wife to contact the office if the patients medication is stopped before August.  She voiced understanding.   Form placed in purple PA folder.

## 2020-02-20 ENCOUNTER — Other Ambulatory Visit: Payer: Self-pay | Admitting: *Deleted

## 2020-02-20 ENCOUNTER — Telehealth: Payer: Self-pay

## 2020-02-20 ENCOUNTER — Encounter: Payer: Self-pay | Admitting: *Deleted

## 2020-02-20 NOTE — Telephone Encounter (Signed)
Spoke with Edison Nasuti at [employer] and informed him of the patients wife's request. Informed him that the patient can not afford $4000 payment  For medication. She stated they had some assistance options and that the medication would be about one hundred and twenty-five dollars a month. He stated the patient may qualify for the assistance. I told him to contact the patient and leave that important information of the patients voicemail and if they are interested they will give them a call back. Edison Nasuti voiced understanding.

## 2020-02-20 NOTE — Telephone Encounter (Signed)
Spoke with patients wife and she stated her husband recently received medicare part D coverage. and [facility] states the medication will cost $4000. Wife states she does not want [facility] to send her spouse anything because they can not afford $4000 rx, "that is crazy".  She states that her spouse is a English as a second language teacher and he has an appt shceduled at the [place]. She states her husband has an appt with a neurologist at the [place] to see if they can get the Nuplazid cheaper through the [place]. She states her husband still wants to see Dr Tat they are just going to the [place] to get help with the Rxs'. She states she has about five weeks of medication so her husband is fine until the appointment at the [place]. She requested that I contact [facility] and tell them not to send her spouse anything.

## 2020-02-20 NOTE — Telephone Encounter (Signed)
Just try to call pt/wife and let them know.  If you can't get ahold of them there is nothing more to be done right now.

## 2020-02-20 NOTE — Telephone Encounter (Signed)
Jenny Reichmann at [facility] [phone] called to inform NUPLAZID is ready for shipping. Pt has not responded to messages to fill presciption. Confirmed pt phone nubmer is accurate. Please advise Tat.

## 2020-02-20 NOTE — Patient Outreach (Signed)
Robert [name]) Care Management  02/20/2020  Robert [name] 05-Mar-1946 [id number]   RN Health Coach Initial Assessment  Referral Date:  01/09/2020 Referral Source:  Transfer from [place] Reason for Referral:  Continued Disease Management Education Insurance:  Medicare   Outreach Attempt:  Outreach attempt #2 to patient's wife for introduction and initial telephone assessment.  Wife answered and stated she was in the store, requested telephone call back.  Plan:  RN Health Coach will make another outreach attempt to wife within the month of April per her request.  Robert Azure RN [place] [phone] [name]

## 2020-02-20 NOTE — Patient Outreach (Signed)
[facility]) Care Management  [facility] Care Manager  02/20/2020   Per Notaro 05-28-1946 [id number]   RN Health Coach Initial Assessment   Referral Date:  01/09/2020 Referral Source:  Transfer from [facility] Care Coordinator Reason for Referral:  Continued Disease Management Education Insurance:  Medicare   Outreach Attempt:  Successful telephone outreach to patient's wife, Tyler Aas for introduction and initial telephone assessment.  HIPAA verified with wife, Tyler Aas (Release of Information on file and patient with history of Parkinson's and Stroke).  RN Health Coach introduced self and role.  Wife verbally agrees to Disease Management outreaches.  Social:   Patient lives at home with wife.  Denies any falls.  Wife stating patient was ambulating with Norberto Sorenson, but has since been bed/chair bound due to "large blood clot" in his leg.  Wife transports to medical appointments.  DME in the home: Hemi walker, blood pressure cuff, reading glasses, grab bar in shower, shower bench, bilateral hearing aids, hospital bed, scale, bilateral hearing aids, and transfer chair.  Wife is requesting air mattress for bed to help prevent skin breakdown (does report rash to scrotum and upper perineal area that is clearing with corn starch).  States she is awaiting call back from Urologist about rash.  Wife reports patient receives home health aide with [facility] 4 days a week, 3 hours a day.  Reports aide is supplied from [place] benefits.  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  Patient currently has indwelling foley catheter since October 2020.  Reports recent emergency room visit related to swelling in left arm after receiving his  first COVID vaccine.  States he was diagnosed with cellulitis of the arm and has completed the prescribed antibiotics.  States they are awaiting the VA clearance to obtain the second COVID vaccine.  Wife reporting post stroke patient was able to ambulate with left foot brace using hemi walker.  Now patient has large DVT to leg and has lots of swelling and wife reporting limited mobility and using transfer chair to get patient around the house and in the care.  States home aide monitors blood pressures when she is there 4 days a week.  Yesterdays blood pressure was 140/68, which wife says has been the highest recently.  Denies any episodes of hypotension.  Reports aid uses manual cuff as their automatic cuff readings are not accurate.  States aide is teaching her how to use manual cuff.  Wife does report patient has increase in anxiety and was recently prescribed Xanax.  Medications:  Wife reports patient taking about 8 medications daily.  Wife manages and dispenses medication to patient.  Does report trouble affording Eliquis and Nuplazid, but receives Eliquis from [place].  Wife also states patient has appointment with Neurologist at [facility] in May and hopes to get Nuplazid from [place] soon.  Declines Pharmacy referral at this time.  Encounter Medications:  Outpatient Encounter Medications as of 02/20/2020  Medication Sig Note  . ALPRAZolam (XANAX) 0.5 MG tablet Take 0.5 mg by mouth as needed for anxiety. 1/2 tablet as needed for anxiety   . bethanechol (URECHOLINE) 10 MG tablet Take 10 mg by mouth in the morning and at bedtime. 02/20/2020: Wife reports taking twice a day  . Carbidopa-Levodopa ER (SINEMET CR)  25-100 MG tablet controlled release TAKE TWO TABLETS BY MOUTH THREE TIMES DAILY  (Patient taking differently: Take 1-2 tablets by mouth See admin instructions. 2 tablets in am 2 tablets at lunch and 1 qhs)   . finasteride (PROSCAR) 5 MG tablet Take 5 mg by mouth daily.   Marland Kitchen losartan (COZAAR) 50 MG tablet Take 25 mg  by mouth daily.    Marland Kitchen Pimavanserin Tartrate (NUPLAZID) 34 MG CAPS Take 1 capsule by mouth daily.   . polyethylene glycol (MIRALAX) packet Take 17 g by mouth daily. (Patient taking differently: Take 17 g by mouth daily as needed for mild constipation. )   . simvastatin (ZOCOR) 20 MG tablet Take 20 mg by mouth every evening.   . tamsulosin (FLOMAX) 0.4 MG CAPS capsule Take 0.4 mg by mouth daily.   Marland Kitchen apixaban (ELIQUIS) 5 MG TABS tablet Take 1 tablet (5 mg total) by mouth 2 (two) times daily. 02/20/2020: Continues to take  . apixaban (ELIQUIS) 5 MG TABS tablet Take 2 tablets (10 mg total) by mouth 2 (two) times daily for 2 days. Continue through 10/26, then start 5mg  BID dosing on 10/27   . cephALEXin (KEFLEX) 500 MG capsule Take 1 capsule (500 mg total) by mouth 4 (four) times daily. (Patient not taking: Reported on 02/20/2020) 02/20/2020: completed   No facility-administered encounter medications on file as of 02/20/2020.    Functional Status:  In your present state of health, do you have any difficulty performing the following activities: 02/20/2020 09/12/2019  Hearing? Y Y  Comment bilateral hearing aids -  Vision? N N  Difficulty concentrating or making decisions? Y N  Comment trouble with memory due to parkinsons and stroke -  Walking or climbing stairs? Y Y  Comment unable to ambulate due to blood clot Weakness  Dressing or bathing? Malvin Johns  Comment wife helps dress and bath Depends on wife  Doing errands, shopping? Malvin Johns  Comment wife runs errands depends on wife  Quarry manager and eating ? Malvin Johns  Comment wife cooks Wife cooks  Using [employer]? Y N  Comment has urinary catheter -  In the past six months, have you accidently leaked urine? Y N  Comment has urinary catheter -  Do you have problems with loss of bowel control? Y N  Comment some incontinence due to Mirilax -  Managing your Medications? Malvin Johns  Comment wife manages medications managed by wife  Managing your Finances? [place]  Comment  wife manages finances -  Housekeeping or managing your Housekeeping? Malvin Johns  Comment wife cleans managed by wife  Some recent data might be hidden    Fall/Depression Screening: Fall Risk  02/20/2020 09/12/2019 07/18/2019  Falls in the past year? 0 0 0  Number falls in past yr: 0 0 0  Injury with Fall? 0 0 0  Risk for fall due to : Impaired balance/gait;Impaired mobility;Medication side effect - -  Follow up Falls evaluation completed;Education provided;Falls prevention discussed - -   PHQ 2/9 Scores 02/20/2020 09/12/2019  Exception Documentation Other- indicate reason in comment box Other- indicate reason in comment box  Not completed did not speak with patient, spoke with wife Assessment completed by wife   [facility] CM Care Plan Problem One     Most Recent Value  Care Plan Problem One  Knowledge deficiet related to management of hypertension  Role Documenting the Problem One  Health Coach  Care Plan for Problem One  Active  [facility] Long Term  Goal   Wife will report no hospitalizations within the next 90 days.  [facility] Long Term Goal Start Date  02/20/20  Interventions for Problem One Long Term Goal  Care plan and goals reviewed and discussed with wife, reviewed medications and indications and encouraged medication compliance, contacted primary care at [facility] for possible air mattress for hospital bed at home, encouraged to continue tohave home aide monitor blood pressures and instruct wife how to use manual blood pressure machine, encouraged wife to take automatic blood pressure machine with her to medical appointments to have nurses help trouble shoot machine, encouraged low salt diet, fall precautions and preventions reviewed and discusses, sending [facility] Calendar booklet to help keep up with appointments and document blood pressures, encouraged to keep and attend scheduled medical appointments, offered [facility] referral for medication assistance (wife declines at this time with hopes to get medication from [place]  soon)     Appointments:  Attended Telehealth appointment with primary care provider, Dr. Kateri Plummer on 02/13/20.  Has scheduled appointment with Neurologist at [facility] in May.  Advanced Directives:  Denies having Advance Directive in place at this time but does report she has the paperwork and in the process of completing it.   Consent:  [facility] services reviewed and discussed.  Wife agrees to Disease Management outreaches.  Plan: RN Health Coach will send primary MD barriers letter. RN Health Coach will route initial telephone assessment note to primary MD. RN Health Coach will send patient 2021 Calendar Booklet. RN Health Coach will send patient [employer] Blood Pressure Control. RN Health Coach outreached to patients primary care at [facility], Dr. Marko Stai concerning air mattress and awaiting return call back. RN Health Coach will make next telephone outreach to patient in the month of May and wife agrees to future outreach.  Rhae Lerner RN [facility] Care Management  RN Health Coach [phone] [name]

## 2020-02-23 ENCOUNTER — Other Ambulatory Visit: Payer: Self-pay

## 2020-02-23 ENCOUNTER — Emergency Department (HOSPITAL_COMMUNITY): Payer: Medicare Other

## 2020-02-23 ENCOUNTER — Emergency Department (HOSPITAL_COMMUNITY)
Admission: EM | Admit: 2020-02-23 | Discharge: 2020-02-23 | Disposition: A | Discharge status: Home / Self Care | Payer: Medicare Other | Attending: Emergency Medicine | Admitting: Emergency Medicine

## 2020-02-23 ENCOUNTER — Encounter (HOSPITAL_COMMUNITY): Payer: Self-pay | Admitting: Student

## 2020-02-23 DIAGNOSIS — M19011 Primary osteoarthritis, right shoulder: Secondary | ICD-10-CM

## 2020-02-23 DIAGNOSIS — Z86718 Personal history of other venous thrombosis and embolism: Secondary | ICD-10-CM | POA: Insufficient documentation

## 2020-02-23 DIAGNOSIS — M25519 Pain in unspecified shoulder: Secondary | ICD-10-CM | POA: Diagnosis not present

## 2020-02-23 DIAGNOSIS — I69354 Hemiplegia and hemiparesis following cerebral infarction affecting left non-dominant side: Secondary | ICD-10-CM | POA: Diagnosis not present

## 2020-02-23 DIAGNOSIS — I10 Essential (primary) hypertension: Secondary | ICD-10-CM | POA: Diagnosis not present

## 2020-02-23 DIAGNOSIS — Z7901 Long term (current) use of anticoagulants: Secondary | ICD-10-CM | POA: Diagnosis not present

## 2020-02-23 DIAGNOSIS — M25511 Pain in right shoulder: Secondary | ICD-10-CM | POA: Diagnosis present

## 2020-02-23 DIAGNOSIS — Z79899 Other long term (current) drug therapy: Secondary | ICD-10-CM | POA: Diagnosis not present

## 2020-02-23 DIAGNOSIS — R52 Pain, unspecified: Secondary | ICD-10-CM | POA: Diagnosis not present

## 2020-02-23 MED ORDER — HYDROCODONE-ACETAMINOPHEN 5-325 MG PO TABS
1.0000 | ORAL_TABLET | ORAL | Status: AC
  Administered 2020-02-23: 1 via ORAL
  Filled 2020-02-23: qty 1

## 2020-02-23 MED ORDER — LIDOCAINE 5 % EX PTCH: 1.0000 | MEDICATED_PATCH | CUTANEOUS | 0 refills | Status: DC

## 2020-02-23 MED ORDER — HYDROCODONE-ACETAMINOPHEN 5-325 MG PO TABS: 1.0000 | ORAL_TABLET | Freq: Four times a day (QID) | ORAL | 0 refills | Status: DC | PRN

## 2020-02-23 NOTE — Discharge Instructions (Addendum)
Take the medications as prescribed.  If the lidoderm prescription is too expensive (it varies depending on insurance) there are over the counter lidocaine patches as well (such as icyhot lidocaine and salonpas lidocaine).  Follow up with your doctor and consider seeing an orthopedic doctor for further treatment.

## 2020-02-23 NOTE — ED Notes (Signed)
Patient transported to x-ray. ?

## 2020-02-23 NOTE — ED Notes (Signed)
ED Provider at bedside. 

## 2020-02-23 NOTE — ED Notes (Addendum)
Patient arrived via [facility] from home with rt shoulder pain 7/10.  Has been taking muscle relaxer and pain medication with no improvement.  Has been doing PT exercises  At home on rt arm after a stroke (2008).  DVT lt leg.  Bed bound at home, can pivot to chair. Lives with wife. A&O x4 with mild confusion.

## 2020-02-23 NOTE — ED Provider Notes (Signed)
[facility] DEPT Provider Note   CSN: [id number] Arrival date & time: 02/23/20  [id number]     History Chief Complaint  Patient presents with  . Shoulder Pain    Robert [name] is a 74 y.o. male.  HPI   Pt presents to the ED for shoulder pain.  Pt has history of prior CVA, left sided weakness.  Pt has been using his right arm to pull himself up etc.  Pt does not recall any injury but started having pain in his shoulder on Tuesday.  He denies any fevers or chills, no swelling.  The pain is primarily in the back of his shoulder by the muscle.  It hurts to raise his arm. Pt called his doctor and started taking tylenol.  It did not help so his doctor called in a muscle relaxant.  That did not help so he came to the Ed for evaluation.  Past Medical History:  Diagnosis Date  . BPH (benign prostatic hyperplasia)   . CVA (cerebral vascular accident) ([place])    with left sided hemiparesis  . Diverticulosis   . Frequency of urination   . GERD (gastroesophageal reflux disease)   . Gross hematuria   . History of acute pyelonephritis    10-13-2012  . History of CVA with residual deficit    2008--  left side of body weakness and foot drop (wears leg brace and uses cane)  . History of DVT of lower extremity    2008--  cva  . Hypercholesteremia   . Hyperlipidemia   . Hyperlipidemia   . Hypertension   . 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 Left leg DVT ([place])   . 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 Urethral stricture   . Urgency of urination   . Urinary retention   . Weakness of left side of body    secondary to cva 2008  . Wears glasses   . Wears hearing aid    bilateral-- wears intermittantly    Patient Active Problem List   Diagnosis Date Noted  . Acute deep vein thrombosis (DVT) of femoral vein of left lower extremity ([place])   . Urinary retention 08/14/2019  . History of CVA with residual deficit   . BPH (benign  prostatic hyperplasia)   . Orthostatic hypotension 08/13/2019  . Hyperlipidemia   . Hemiparesis affecting left side as late effect of cerebrovascular accident ([place]) 12/06/2017  . Gait abnormality 11/22/2017  . UTI (urinary tract infection) 04/22/2017  . Acute lower UTI 04/22/2017  . Tremor 04/22/2017  . Acute pyelonephritis 09/29/2012  . Nausea 09/29/2012  . Fever 09/29/2012  . HTN (hypertension) 09/29/2012  . H/O: CVA (cerebrovascular accident) 09/29/2012  . Hearing loss 09/29/2012    Past Surgical History:  Procedure Laterality Date  . CYSTOSCOPY WITH RETROGRADE URETHROGRAM N/A 10/23/2015   Procedure: CYSTOSCOPY WITH RETROGRADE URETHROGRAM;  Surgeon: [name], MD;  Location[facility];  Service: Urology;  Laterality: N/A;  . CYSTOSCOPY WITH URETHRAL DILATATION N/A 10/23/2015   Procedure: CYSTOSCOPY WITH URETHRAL BALLOON DILATATION;  Surgeon: [name], MD;  Location: [facility];  Service: Urology;  Laterality: N/A;  BALLOON DILATION   . IVC FILTER PLACEMENT ([place] H[place])  2008       Family History  Problem Relation Age of Onset  . Diabetes Sister   . Lung cancer Brother        Post  9/11 voluntary work in [employer]  . Prostate cancer Brother   . Other Mother        passed away young- non medical  . Alzheimer's disease Father   . Healthy Son     Social History   Tobacco Use  . Smoking status: Former Smoker    Years: 10.00    Types: Cigarettes    Quit date: 10/26/1970    Years since quitting: 49.3  . Smokeless tobacco: Never Used  Substance Use Topics  . Alcohol use: No  . Drug use: No    Home Medications Prior to Admission medications   Medication Sig Start Date End Date Taking? Authorizing Provider  acetaminophen (TYLENOL) 500 MG tablet Take 1,000 mg by mouth every 6 (six) hours as needed (shoulder pain).   Yes [provider]  ALPRAZolam Duanne Moron) 0.5 MG tablet Take 0.5 mg by mouth as needed for anxiety.    Yes [provider]  apixaban (ELIQUIS) 5 MG TABS tablet Take 1 tablet (5 mg total) by mouth 2 (two) times daily. 08/22/19 02/23/20 Yes Donne Hazel, MD  bethanechol (URECHOLINE) 10 MG tablet Take 10 mg by mouth in the morning and at bedtime.   Yes [provider]  Carbidopa-Levodopa ER (SINEMET CR) 25-100 MG tablet controlled release TAKE TWO TABLETS BY MOUTH THREE TIMES DAILY  Patient taking differently: Take 1-2 tablets by mouth See admin instructions. 2 tablets in am 2 tablets at lunch and 1 qhs 01/29/20  Yes Tat, Eustace Quail, DO  finasteride (PROSCAR) 5 MG tablet Take 5 mg by mouth daily.   Yes [provider]  losartan (COZAAR) 50 MG tablet Take 25 mg by mouth daily.    Yes [provider]  Pimavanserin Tartrate (NUPLAZID) 34 MG CAPS Take 1 capsule by mouth daily.   Yes [provider]  polyethylene glycol (MIRALAX) packet Take 17 g by mouth daily. Patient taking differently: Take 17 g by mouth daily as needed for mild constipation.  05/08/17  Yes Mackuen, Courteney Lyn, MD  simvastatin (ZOCOR) 20 MG tablet Take 20 mg by mouth every evening.   Yes [provider]  tamsulosin (FLOMAX) 0.4 MG CAPS capsule Take 0.4 mg by mouth daily.   Yes [provider]  apixaban (ELIQUIS) 5 MG TABS tablet Take 2 tablets (10 mg total) by mouth 2 (two) times daily for 2 days. Continue through 10/26, then start 5mg  BID dosing on 10/27 08/19/19 08/21/19  Donne Hazel, MD  cephALEXin (KEFLEX) 500 MG capsule Take 1 capsule (500 mg total) by mouth 4 (four) times daily. Patient not taking: Reported on 02/20/2020 02/07/20   Maudie Flakes, MD  HYDROcodone-acetaminophen (NORCO/VICODIN) 5-325 MG tablet Take 1 tablet by mouth every 6 (six) hours as needed for severe pain. 02/23/20   Dorie Rank, MD  lidocaine (LIDODERM) 5 % Place 1 patch onto the skin daily. Remove & Discard patch within 12 hours or as directed by MD 02/23/20   Dorie Rank, MD    Allergies     Propofol  Review of Systems   Review of Systems  All other systems reviewed and are negative.   Physical Exam Updated Vital Signs BP (!) 158/87 (BP Location: Left Arm)   Pulse 83   Temp 98.6 F (37 C) (Oral)   Resp 17   Ht 1.88 m (6\' 2" )   Wt 107 kg   SpO2 99%   BMI 30.30 kg/m   Physical Exam Vitals and nursing note reviewed.  Constitutional:      General: He is not in acute distress.    Appearance: He is well-developed.  HENT:     Head: Normocephalic and atraumatic.     Right Ear: External ear normal.     Left Ear: External ear normal.  Eyes:     General: No scleral icterus.       Right eye: No discharge.        Left eye: No discharge.     Conjunctiva/sclera: Conjunctivae normal.  Neck:     Trachea: No tracheal deviation.  Cardiovascular:     Rate and Rhythm: Normal rate.  Pulmonary:     Effort: Pulmonary effort is normal. No respiratory distress.     Breath sounds: No stridor.  Abdominal:     General: There is no distension.  Musculoskeletal:        General: No swelling or deformity.     Cervical back: Neck supple.  Skin:    General: Skin is warm and dry.     Findings: No rash.  Neurological:     Mental Status: He is alert.     Cranial Nerves: Cranial nerve deficit: no gross deficits.     Comments: Left sided hemiparesis     ED Results / Procedures / Treatments   Labs (all labs ordered are listed, but only abnormal results are displayed) Labs Reviewed - No data to display  EKG None  Radiology DG Shoulder Right  Result Date: 02/23/2020 CLINICAL DATA:  Shoulder pain EXAM: RIGHT SHOULDER - 2+ VIEW COMPARISON:  None. FINDINGS: Advanced degenerative changes in the right AC and glenohumeral joints with joint space loss and spurring. No acute bony abnormality. Specifically, no fracture, subluxation, or dislocation. IMPRESSION: Advanced osteoarthritis.  No acute bony abnormality. Electronically Signed   By: Rolm Baptise M.D.   On: 02/23/2020 09:14     Procedures Procedures (including critical care time)  Medications Ordered in ED Medications  HYDROcodone-acetaminophen (NORCO/VICODIN) 5-325 MG per tablet 1 tablet (1 tablet Oral Given 02/23/20 [id number])    ED Course  I have reviewed the triage vital signs and the nursing notes.  Pertinent labs & imaging results that were available during my care of the patient were reviewed by me and considered in my medical decision making (see chart for details).    MDM Rules/Calculators/A&P                      Pt presented with ataumatic shoulder pain.  No signs to suggest infection.  Pt does not appear to be referred in nature, such as radiculopathy.  Xray shows oa.  Suspect pain related to his OA, increased use of his right arm due to weakness stroke on left side.  Short course of opiates, lidocaine patch. Discussed stool softener use while on norco.  Follow up pcp and ortho Final Clinical Impression(s) / ED Diagnoses Final diagnoses:  Primary osteoarthritis of right shoulder    Rx / DC Orders ED Discharge Orders         Ordered    HYDROcodone-acetaminophen (NORCO/VICODIN) 5-325 MG tablet  Every 6 hours PRN     02/23/20 0936    lidocaine (LIDODERM) 5 %  Every 24 hours     02/23/20 0936           Dorie Rank, MD 02/23/20 [phone]

## 2020-02-27 DIAGNOSIS — L909 Atrophic disorder of skin, unspecified: Secondary | ICD-10-CM | POA: Diagnosis not present

## 2020-02-27 DIAGNOSIS — I10 Essential (primary) hypertension: Secondary | ICD-10-CM | POA: Diagnosis not present

## 2020-02-27 DIAGNOSIS — G8190 Hemiplegia, unspecified affecting unspecified side: Secondary | ICD-10-CM | POA: Diagnosis not present

## 2020-02-27 DIAGNOSIS — M25511 Pain in right shoulder: Secondary | ICD-10-CM | POA: Diagnosis not present

## 2020-02-27 DIAGNOSIS — I6930 Unspecified sequelae of cerebral infarction: Secondary | ICD-10-CM | POA: Diagnosis not present

## 2020-02-27 DIAGNOSIS — M7989 Other specified soft tissue disorders: Secondary | ICD-10-CM | POA: Diagnosis not present

## 2020-02-27 DIAGNOSIS — Z86718 Personal history of other venous thrombosis and embolism: Secondary | ICD-10-CM | POA: Diagnosis not present

## 2020-02-28 DIAGNOSIS — N402 Nodular prostate without lower urinary tract symptoms: Secondary | ICD-10-CM | POA: Diagnosis not present

## 2020-02-28 DIAGNOSIS — R338 Other retention of urine: Secondary | ICD-10-CM | POA: Diagnosis not present

## 2020-02-29 ENCOUNTER — Encounter: Payer: Self-pay | Admitting: *Deleted

## 2020-02-29 ENCOUNTER — Other Ambulatory Visit: Payer: Self-pay | Admitting: *Deleted

## 2020-02-29 NOTE — Patient Outreach (Signed)
[facility]) Care Management  02/29/2020  Robert [name] September 18, 1946 [id number]   RN Health Coach Monthly Outreach  Referral Date:01/09/2020 Referral Source:Transfer from [place] Reason for Referral:Continued Disease Management Education Insurance:Medicare   Outreach Attempt:  Outreach attempt #1 to patient's wife for follow up. No answer. RN Health Coach left HIPAA compliant voicemail message along with contact information.  RN Health Coach outreached to [place] for follow up after message left for air mattress for patient.  Per Marland Kitchen with [facility] [phone]) provider-Dr. Marjo Bicker, patient or wife has to call in and request orders for air mattress.  Will update wife when able to speak with her.  Plan:  RN Health Coach will make another outreach attempt within the month of June if no return call back from patient.   [place] Coach [phone] [name]

## 2020-02-29 NOTE — Patient Outreach (Signed)
[facility]) Care Management  02/29/2020  Robert [name] Mar 11, 1946 [id number]   RN Health Coach Monthly Outreach  Referral Date:01/09/2020 Referral Source:Transfer from [place] Reason for Referral:Continued Disease Management Education Insurance:Medicare   Outreach Attempt: Received call back from patient's wife. HIPAA verified with wife.  Wife reports emergency room visit for arm pain last week.  States pain medication prescribed has helped and he has appointment with orthopedist tomorrow.  Continues to have foley cath in place.  Wife reports she was instructed to remove catheter Monday morning when they awaken and attend Urology appointment later that day at 11 am.  Per wife they (Urologist office) verbally instructed her how to remove catheter.  Encouraged wife that if patient states he is having pain when she is removing cath to stop and contact provider.  Wife reporting irritation of scrotum is getting better.  States patient's blood pressures at providers offices this week have been good (120-130/70's).  Concerned blood pressures at home have been a little elevated in 140/80's and unsure if meter is accurate so states she has ordered a new meter.  Wife also reports primary care provider has ordered air mattress through Medicare.  Wife updated on VA's response for her to contact provider at [facility] if they are wanting air mattress through [place].  Appointments:  Attended appointment with primary care provider, Dr. Orland Mustard on 02/27/2020 and has scheduled appointment with orthopedist on 03/01/2020.  Has scheduled appointment with Neurologist at [facility] next week, per wife.  Plan: RN Health Coach will make next telephone outreach wife within the month of June and wife agrees to future outreach.  [place] [phone] [name]

## 2020-03-01 DIAGNOSIS — M19011 Primary osteoarthritis, right shoulder: Secondary | ICD-10-CM | POA: Diagnosis not present

## 2020-03-04 DIAGNOSIS — R338 Other retention of urine: Secondary | ICD-10-CM | POA: Diagnosis not present

## 2020-03-05 ENCOUNTER — Other Ambulatory Visit: Payer: Self-pay | Admitting: Neurology

## 2020-03-05 NOTE — Telephone Encounter (Signed)
Rx(s) sent to pharmacy electronically.  

## 2020-03-11 DIAGNOSIS — Z86718 Personal history of other venous thrombosis and embolism: Secondary | ICD-10-CM | POA: Diagnosis not present

## 2020-03-11 DIAGNOSIS — Z0000 Encounter for general adult medical examination without abnormal findings: Secondary | ICD-10-CM | POA: Diagnosis not present

## 2020-03-11 DIAGNOSIS — E785 Hyperlipidemia, unspecified: Secondary | ICD-10-CM | POA: Diagnosis not present

## 2020-03-11 DIAGNOSIS — I10 Essential (primary) hypertension: Secondary | ICD-10-CM | POA: Diagnosis not present

## 2020-03-11 DIAGNOSIS — E1169 Type 2 diabetes mellitus with other specified complication: Secondary | ICD-10-CM | POA: Diagnosis not present

## 2020-03-11 DIAGNOSIS — G8190 Hemiplegia, unspecified affecting unspecified side: Secondary | ICD-10-CM | POA: Diagnosis not present

## 2020-03-11 DIAGNOSIS — G20 Parkinson's disease: Secondary | ICD-10-CM | POA: Diagnosis not present

## 2020-03-11 DIAGNOSIS — F321 Major depressive disorder, single episode, moderate: Secondary | ICD-10-CM | POA: Diagnosis not present

## 2020-03-26 DIAGNOSIS — Z0000 Encounter for general adult medical examination without abnormal findings: Secondary | ICD-10-CM | POA: Diagnosis not present

## 2020-03-26 DIAGNOSIS — Z86718 Personal history of other venous thrombosis and embolism: Secondary | ICD-10-CM | POA: Diagnosis not present

## 2020-03-26 DIAGNOSIS — E1169 Type 2 diabetes mellitus with other specified complication: Secondary | ICD-10-CM | POA: Diagnosis not present

## 2020-03-26 DIAGNOSIS — E785 Hyperlipidemia, unspecified: Secondary | ICD-10-CM | POA: Diagnosis not present

## 2020-04-05 DIAGNOSIS — R338 Other retention of urine: Secondary | ICD-10-CM | POA: Diagnosis not present

## 2020-04-08 ENCOUNTER — Other Ambulatory Visit: Payer: Self-pay | Admitting: *Deleted

## 2020-04-08 ENCOUNTER — Encounter: Payer: Self-pay | Admitting: *Deleted

## 2020-04-08 NOTE — Patient Outreach (Signed)
[facility]) Care Management  04/08/2020  Robert [name] [birth year] [id number]   RN Health Coach Monthly Outreach  Referral Date:01/09/2020 Referral Source:Transfer from [place] Reason for Referral:Continued Disease Management Education Insurance:Medicare   Outreach Attempt:  Successful telephone outreach to patient's wife for follow up.  HIPAA verified with wife, Release of Information on File.  Wife reporting patient is doing well.  States failed voiding trail and still has foley cath in place.  Discussed importance of foley care and avoidance of urinary tract infections.  Also, reports patient has obtained his air mattress for his hospital bed.  States currently skin is without any breakdown or open areas.  Wife stating patient has began outpatient physical therapy last week.  Has been monitoring blood pressures at home.  Blood pressure this morning was 117/62 with recent ranges of 110-120/60-70's.  Denies any periods of light headiness or dizziness.  Denies any recent falls.  Wife stating patient seen Neurologist at [facility] and was started on new Parkinson's medication.  Reports medication is helping patient be more alert.  Appointments:  Attended appointment with Dr. Rolland Bimler, primary care on 02/29/2020.  Plan: RN Health Coach will make next telephone outreach to patient's wife within the month of July and wife agrees to future outreach.  [place] [phone] [email or web address]

## 2020-04-12 ENCOUNTER — Ambulatory Visit: Payer: Medicare Other | Admitting: Neurology

## 2020-05-07 ENCOUNTER — Ambulatory Visit: Payer: Medicare Other | Admitting: Neurology

## 2020-05-14 ENCOUNTER — Other Ambulatory Visit: Payer: Self-pay | Admitting: *Deleted

## 2020-05-14 NOTE — Patient Outreach (Signed)
[facility]) Care Management  05/14/2020  Robert [name] [birth year] [id number]   RN Health Coach Monthly Outreach  Referral Date:01/09/2020 Referral Source:Transfer from [place] Reason for Referral:Continued Disease Management Education Insurance:Medicare   Outreach Attempt:  Outreach attempt #1 to patient's wife for follow up.  Wife answered and stated she was not able to speak at this time.  Plan:  RN Health Coach will make another outreach attempt within the month of August if no return call back from wife.   [place] [phone] [email or web address]

## 2020-05-24 ENCOUNTER — Other Ambulatory Visit: Payer: Self-pay | Admitting: *Deleted

## 2020-05-24 NOTE — Patient Outreach (Signed)
[facility]) Care Management  05/24/2020  Zylon Creamer 06/04/1946 [id number]   RN Health Coach Monthly Outreach  Referral Date:01/09/2020 Referral Source:Transfer from [place] Reason for Referral:Continued Disease Management Education Insurance:Medicare   Outreach Attempt:  Outreach attempt #2 to patient's wife for follow up. No answer. RN Health Coach left HIPAA compliant voicemail message along with contact information.  Plan:  RN Health Coach will make another outreach attempt within the month of August if no return call back from patient.   [place] [phone] [email or web address]

## 2020-05-30 DIAGNOSIS — H9113 Presbycusis, bilateral: Secondary | ICD-10-CM | POA: Diagnosis not present

## 2020-05-30 DIAGNOSIS — H6122 Impacted cerumen, left ear: Secondary | ICD-10-CM | POA: Diagnosis not present

## 2020-06-07 DIAGNOSIS — N35013 Post-traumatic anterior urethral stricture: Secondary | ICD-10-CM | POA: Diagnosis not present

## 2020-06-14 DIAGNOSIS — F418 Other specified anxiety disorders: Secondary | ICD-10-CM | POA: Diagnosis not present

## 2020-06-18 ENCOUNTER — Encounter: Payer: Self-pay | Admitting: *Deleted

## 2020-06-18 ENCOUNTER — Other Ambulatory Visit: Payer: Self-pay | Admitting: *Deleted

## 2020-06-18 NOTE — Patient Outreach (Addendum)
[facility]) Care Management  [facility] Care Manager  06/18/2020   Wilfred Clemmer 1946-07-08 [id number]   RN Health Coach Monthly Outreach   Referral Date:  01/09/2020 Referral Source:  Transfer from [facility] Care Coordinator Reason for Referral:  Continued Disease Management Education Insurance:  Medicare  Addendum:  Successful telephone outreach to wife and verified HIPAA.  Confirmed with wife, she and patient has food and toiletries available in the home currently.  RN Health Coach collaborated with [facility] Social Workers and discussed food resources.  Offered wife Mobile Meals or [employer] and she declines both at this time.  Encouraged wife to request to speak with social worker at the [place] for other resources.  Sending wife list of Senior Resources, [facility], and List of Parkinson's Disease Support Groups.  Wife very appreciative of list being mailed and states she will reach out to the [place] for assistance also.  Encouraged wife to contact this RN Health Coach if food is unavailable at anytime in the future.     Outreach Attempt:  Successful telephone outreach to patient's wife for follow up.  HIPAA verified with Tyler Aas, wife.  States patient is doing about the same.  Continues to have foley cath in place and they are looking into having someone come to the home monthly to exchange catheter.  Continues with outpatient physical therapy and denies any recent falls.  Blood pressure this morning was 117/80 with heart rate of 101.  Wife denies any recent episodes of hypo or hypertension.  Wife does state "paying for medications makes it hard to have monies to pay for food and toiletries".  Does acknowledge she has the list of food banks.  Discussed with wife transferring medications to [place] to free some monies for other necessities.  Encounter Medications:  Outpatient Encounter Medications as of 06/18/2020  Medication Sig Note  . bethanechol (URECHOLINE) 10 MG  tablet Take 10 mg by mouth in the morning and at bedtime.   . Carbidopa-Levodopa ER (SINEMET CR) 25-100 MG tablet controlled release TAKE TWO TABLETS BY MOUTH THREE TIMES DAILY    . finasteride (PROSCAR) 5 MG tablet Take 5 mg by mouth daily.   Marland Kitchen losartan (COZAAR) 50 MG tablet Take 25 mg by mouth daily.    Marland Kitchen Pimavanserin Tartrate (NUPLAZID) 34 MG CAPS Take 1 capsule by mouth daily.   . rasagiline (AZILECT) 0.5 MG TABS tablet Take 1 mg by mouth daily.   . simvastatin (ZOCOR) 20 MG tablet Take 20 mg by mouth every evening.   . tamsulosin (FLOMAX) 0.4 MG CAPS capsule Take 0.4 mg by mouth daily.   Marland Kitchen acetaminophen (TYLENOL) 500 MG tablet Take 1,000 mg by mouth every 6 (six) hours as needed (shoulder pain).   Marland Kitchen ALPRAZolam (XANAX) 0.5 MG tablet Take 0.5 mg by mouth as needed for anxiety.    Marland Kitchen apixaban (ELIQUIS) 5 MG TABS tablet Take 1 tablet (5 mg total) by mouth 2 (two) times daily. 04/08/2020: Continues to take  . apixaban (ELIQUIS) 5 MG TABS tablet Take 2 tablets (10 mg total) by mouth 2 (two) times daily for 2 days. Continue through 10/26, then start 5mg  BID dosing on 10/27   . cephALEXin (KEFLEX) 500 MG capsule Take 1 capsule (500 mg total) by mouth 4 (four) times daily. (Patient not taking: Reported on 02/20/2020) 02/20/2020: completed  . HYDROcodone-acetaminophen (NORCO/VICODIN) 5-325 MG tablet Take 1 tablet by mouth every 6 (six) hours as needed for severe pain.   Marland Kitchen  lidocaine (LIDODERM) 5 % Place 1 patch onto the skin daily. Remove & Discard patch within 12 hours or as directed by MD   . polyethylene glycol (MIRALAX) packet Take 17 g by mouth daily. (Patient taking differently: Take 17 g by mouth daily as needed for mild constipation. )    No facility-administered encounter medications on file as of 06/18/2020.    Functional Status:  In your present state of health, do you have any difficulty performing the following activities: 02/20/2020 09/12/2019  Hearing? Y Y  Comment bilateral hearing aids -    Vision? N N  Difficulty concentrating or making decisions? Y N  Comment trouble with memory due to parkinsons and stroke -  Walking or climbing stairs? Y Y  Comment unable to ambulate due to blood clot Weakness  Dressing or bathing? Malvin Johns  Comment wife helps dress and bath Depends on wife  Doing errands, shopping? Malvin Johns  Comment wife runs errands depends on wife  Quarry manager and eating ? Malvin Johns  Comment wife cooks Wife cooks  Using [employer]? Y N  Comment has urinary catheter -  In the past six months, have you accidently leaked urine? Y N  Comment has urinary catheter -  Do you have problems with loss of bowel control? Y N  Comment some incontinence due to Mirilax -  Managing your Medications? Malvin Johns  Comment wife manages medications managed by wife  Managing your Finances? [place]  Comment wife manages finances -  Housekeeping or managing your Housekeeping? Malvin Johns  Comment wife cleans managed by wife  Some recent data might be hidden    Fall/Depression Screening: Fall Risk  06/18/2020 04/08/2020 02/20/2020  Falls in the past year? 0 0 0  Number falls in past yr: 0 0 0  Injury with Fall? 0 0 0  Risk for fall due to : Impaired balance/gait;Impaired mobility;Impaired vision;Medication side effect Medication side effect;Impaired balance/gait;Impaired mobility;Impaired vision;Mental status change Impaired balance/gait;Impaired mobility;Medication side effect  Follow up Falls evaluation completed;Education provided;Falls prevention discussed Falls evaluation completed;Education provided;Falls prevention discussed Falls evaluation completed;Education provided;Falls prevention discussed   PHQ 2/9 Scores 02/20/2020 09/12/2019  Exception Documentation Other- indicate reason in comment box Other- indicate reason in comment box  Not completed did not speak with patient, spoke with wife Assessment completed by wife   Goals Addressed              This Visit's Progress   .  Wife will report no  unplanned hospitalizations within the next 90 days. (pt-stated)        CARE PLAN ENTRY (see longtitudinal plan of care for additional care plan information)  Objective:  . Last practice recorded BP readings:  BP Readings from Last 3 Encounters:  02/23/20 (!) 150/87  02/07/20 126/70  09/29/19 (!) 143/92 .   Marland Kitchen Most recent eGFR/CrCl: No results found for: EGFR  No components found for: CRCL  Current Barriers:  Marland Kitchen Knowledge deficit related to self care management of hypertension . Corporate treasurer.   Case Manager Clinical Goal(s):  Marland Kitchen Over the next 90 days, patient will verbalize understanding of plan for hypertension management . Over the next 90 days, patient will not experience hospital admission. Hospital Admissions in last 6 months = 0 . Over the next 90 days, patient will attend all scheduled medical appointments: scheduled appointment with primary care provider on 06/21/2020 . Over the next 90 days, patient will demonstrate improved adherence to prescribed treatment plan for hypertension  as evidenced by taking all medications as prescribed, monitoring and recording blood pressure as directed, adhering to low sodium/DASH diet . Over the next 90 days, patient will verbalize basic understanding of hypertension disease process and self health management plan as evidenced by continued daily monitoring of blood pressures and notifying provider when elevated  Interventions:  . Evaluation of current treatment plan related to hypertension self management and patient's adherence to plan as established by provider. . Reviewed medications with patient and discussed importance of compliance . Discussed plans with patient for ongoing care management follow up and provided patient with direct contact information for care management team . Advised patient, providing education and rationale, to monitor blood pressure daily and record, calling PCP for findings outside established parameters.   . Reviewed scheduled/upcoming provider appointments including:  . Provided education regarding s/s of DASH diet/Low salt diet . Provided education regarding increasing physical activity . Discussed signs and symptoms of hypertension . Discussed with wife and encouraged to try and get all medications transferred to the [place] to help with pricing and free monies for food resources . Confirmed wife has list of food resources  Patient Self Care Activities:  . Self administers medications as prescribed . Attends all scheduled provider appointments . Calls provider office for new concerns, questions, or BP outside discussed parameters . Monitors BP and records as discussed . Adheres to a low sodium diet/DASH diet . Increase physical activity as tolerated . Verbalize where to go to receive medical care  Initial goal documentation        Appointments: Wife reports patient has appointment with primary care provider, Dr. Kateri Plummer on 06/21/2020.  Plan: RN Health Coach will send primary care provider quarterly update. RN Health Coach will make next telephone outreach to family within the month of October and wife agrees to future outreach.  Rhae Lerner RN [facility] Care Management  RN Health Coach [phone] [email or web address]

## 2020-06-18 NOTE — Patient Outreach (Signed)
[facility]) Care Management  06/18/2020  Robert [name] [birth year] [id number]   RN Health Coach Monthly Outreach  Referral Date:01/09/2020 Referral Source:Transfer from [place] Reason for Referral:Continued Disease Management Education Insurance:Medicare   Outreach Attempt:  Outreach attempt #3 to patient's wife for follow up.  Wife answered and stated they were just returning from therapy and she is getting patient settled.  Requesting call back.   Plan:  RN Health Coach will make another outreach attempt within the next 10 business days.   [place] [phone] [email or web address]

## 2020-06-21 DIAGNOSIS — L309 Dermatitis, unspecified: Secondary | ICD-10-CM | POA: Diagnosis not present

## 2020-06-21 DIAGNOSIS — F411 Generalized anxiety disorder: Secondary | ICD-10-CM | POA: Diagnosis not present

## 2020-06-21 DIAGNOSIS — G20 Parkinson's disease: Secondary | ICD-10-CM | POA: Diagnosis not present

## 2020-07-30 DIAGNOSIS — N35011 Post-traumatic bulbous urethral stricture: Secondary | ICD-10-CM | POA: Diagnosis not present

## 2020-07-30 DIAGNOSIS — R338 Other retention of urine: Secondary | ICD-10-CM | POA: Diagnosis not present

## 2020-08-12 ENCOUNTER — Other Ambulatory Visit: Payer: Self-pay | Admitting: *Deleted

## 2020-08-12 ENCOUNTER — Encounter: Payer: Self-pay | Admitting: *Deleted

## 2020-08-12 NOTE — Patient Outreach (Signed)
[facility]) Care Management  [name]  08/12/2020   Robert [name] 1945/11/30 [id number]   RN Health Coach Monthly Outreach  Referral Date:01/09/2020 Referral Source:Transfer from [place] Reason for Referral:Continued Disease Management Education Insurance:Medicare   Outreach Attempt:  Successful telephone outreach to patient's wife, Robert [name] for follow up.  HIPAA verified with wife.  Wife reports patient is doing well.  Continues to receive outpatient therapy with the [place].  Has been monitoring blood pressures and they have ranged 120-130/60-70's.  Denies any episodes of hypo or hypertension.  Does report she has obtained resources mailed to her.  Denies any recent falls or skin wounds.  Continues with indwelling foley catheter and home health nurses comes out to change it every month.  Appointments:  Attended appointment with primary care provider, Dr. Orland Mustard on 06/21/2020.  Has upcoming eye appointment at the [facility].  Plan:  RN Health Coach will make next telephone outreach to patient's wife within the month of December and wife agrees to future outreach.   [place] [phone] [name]

## 2020-09-17 DIAGNOSIS — I10 Essential (primary) hypertension: Secondary | ICD-10-CM | POA: Diagnosis not present

## 2020-09-17 DIAGNOSIS — F411 Generalized anxiety disorder: Secondary | ICD-10-CM | POA: Diagnosis not present

## 2020-09-17 DIAGNOSIS — G8190 Hemiplegia, unspecified affecting unspecified side: Secondary | ICD-10-CM | POA: Diagnosis not present

## 2020-09-17 DIAGNOSIS — N400 Enlarged prostate without lower urinary tract symptoms: Secondary | ICD-10-CM | POA: Diagnosis not present

## 2020-09-17 DIAGNOSIS — G20 Parkinson's disease: Secondary | ICD-10-CM | POA: Diagnosis not present

## 2020-09-17 DIAGNOSIS — R609 Edema, unspecified: Secondary | ICD-10-CM | POA: Diagnosis not present

## 2020-09-17 DIAGNOSIS — E1169 Type 2 diabetes mellitus with other specified complication: Secondary | ICD-10-CM | POA: Diagnosis not present

## 2020-09-17 DIAGNOSIS — L309 Dermatitis, unspecified: Secondary | ICD-10-CM | POA: Diagnosis not present

## 2020-09-26 DIAGNOSIS — Z23 Encounter for immunization: Secondary | ICD-10-CM | POA: Diagnosis not present

## 2020-09-30 ENCOUNTER — Other Ambulatory Visit: Payer: Self-pay | Admitting: *Deleted

## 2020-09-30 NOTE — Patient Outreach (Signed)
[facility]) Care Management  [place]  09/30/2020   Robert [name] [birth year] [id number]   RN Health Coach Monthly Outreach  Referral Date:01/09/2020 Referral Source:Transfer from [place] Reason for Referral:Continued Disease Management Education Insurance:Medicare   Outreach Attempt:  Outreach attempt #1 to patient's wife for follow up.  Wife answered and stated she was driving, unable to speak at this time.   Plan:  RN Health Coach will make another outreach attempt within the month of January if no return call back from wife.   [place] [phone] [email or web address]

## 2020-10-26 DIAGNOSIS — F419 Anxiety disorder, unspecified: Secondary | ICD-10-CM | POA: Diagnosis present

## 2020-11-04 ENCOUNTER — Ambulatory Visit: Payer: Self-pay | Admitting: *Deleted

## 2020-11-05 ENCOUNTER — Other Ambulatory Visit: Payer: Self-pay | Admitting: *Deleted

## 2020-11-05 NOTE — Patient Outreach (Signed)
[facility]) Care Management  11/05/2020  Robert [name] 08-23-1946 [id number]  RN Health Coach attempted follow up outreach call to patient.  Per wife Robert [name] this is not a good time to speak with Health Coach. Plan: RN will call patient again within 30 days.  [place] Care Management [phone]

## 2020-11-09 ENCOUNTER — Encounter (HOSPITAL_COMMUNITY): Payer: Self-pay | Admitting: Emergency Medicine

## 2020-11-09 ENCOUNTER — Other Ambulatory Visit: Payer: Self-pay

## 2020-11-09 ENCOUNTER — Inpatient Hospital Stay (HOSPITAL_COMMUNITY)
Admission: EM | Admit: 2020-11-09 | Discharge: 2020-11-12 | DRG: 698 | Disposition: A | Discharge status: Home Health | Payer: Medicare Other | Attending: Internal Medicine | Admitting: Internal Medicine

## 2020-11-09 ENCOUNTER — Emergency Department (HOSPITAL_COMMUNITY): Payer: Medicare Other

## 2020-11-09 DIAGNOSIS — R197 Diarrhea, unspecified: Secondary | ICD-10-CM

## 2020-11-09 DIAGNOSIS — T83021A Displacement of indwelling urethral catheter, initial encounter: Secondary | ICD-10-CM | POA: Diagnosis present

## 2020-11-09 DIAGNOSIS — J9811 Atelectasis: Secondary | ICD-10-CM | POA: Diagnosis not present

## 2020-11-09 DIAGNOSIS — N390 Urinary tract infection, site not specified: Secondary | ICD-10-CM

## 2020-11-09 DIAGNOSIS — Z833 Family history of diabetes mellitus: Secondary | ICD-10-CM

## 2020-11-09 DIAGNOSIS — Z20822 Contact with and (suspected) exposure to covid-19: Secondary | ICD-10-CM | POA: Diagnosis not present

## 2020-11-09 DIAGNOSIS — R338 Other retention of urine: Secondary | ICD-10-CM | POA: Diagnosis present

## 2020-11-09 DIAGNOSIS — Y846 Urinary catheterization as the cause of abnormal reaction of the patient, or of later complication, without mention of misadventure at the time of the procedure: Secondary | ICD-10-CM | POA: Diagnosis present

## 2020-11-09 DIAGNOSIS — I10 Essential (primary) hypertension: Secondary | ICD-10-CM | POA: Diagnosis present

## 2020-11-09 DIAGNOSIS — Z8042 Family history of malignant neoplasm of prostate: Secondary | ICD-10-CM

## 2020-11-09 DIAGNOSIS — N179 Acute kidney failure, unspecified: Secondary | ICD-10-CM | POA: Diagnosis not present

## 2020-11-09 DIAGNOSIS — N1330 Unspecified hydronephrosis: Secondary | ICD-10-CM | POA: Diagnosis not present

## 2020-11-09 DIAGNOSIS — A419 Sepsis, unspecified organism: Secondary | ICD-10-CM | POA: Diagnosis present

## 2020-11-09 DIAGNOSIS — R7989 Other specified abnormal findings of blood chemistry: Secondary | ICD-10-CM | POA: Diagnosis present

## 2020-11-09 DIAGNOSIS — T83511A Infection and inflammatory reaction due to indwelling urethral catheter, initial encounter: Secondary | ICD-10-CM | POA: Diagnosis not present

## 2020-11-09 DIAGNOSIS — N1339 Other hydronephrosis: Secondary | ICD-10-CM

## 2020-11-09 DIAGNOSIS — R739 Hyperglycemia, unspecified: Secondary | ICD-10-CM | POA: Diagnosis present

## 2020-11-09 DIAGNOSIS — Z974 Presence of external hearing-aid: Secondary | ICD-10-CM

## 2020-11-09 DIAGNOSIS — G20A1 Parkinson's disease without dyskinesia, without mention of fluctuations: Secondary | ICD-10-CM | POA: Diagnosis present

## 2020-11-09 DIAGNOSIS — K219 Gastro-esophageal reflux disease without esophagitis: Secondary | ICD-10-CM | POA: Diagnosis present

## 2020-11-09 DIAGNOSIS — E1165 Type 2 diabetes mellitus with hyperglycemia: Secondary | ICD-10-CM | POA: Diagnosis present

## 2020-11-09 DIAGNOSIS — R6510 Systemic inflammatory response syndrome (SIRS) of non-infectious origin without acute organ dysfunction: Secondary | ICD-10-CM

## 2020-11-09 DIAGNOSIS — Z7902 Long term (current) use of antithrombotics/antiplatelets: Secondary | ICD-10-CM

## 2020-11-09 DIAGNOSIS — Z973 Presence of spectacles and contact lenses: Secondary | ICD-10-CM

## 2020-11-09 DIAGNOSIS — N136 Pyonephrosis: Secondary | ICD-10-CM | POA: Diagnosis present

## 2020-11-09 DIAGNOSIS — N401 Enlarged prostate with lower urinary tract symptoms: Secondary | ICD-10-CM | POA: Diagnosis present

## 2020-11-09 DIAGNOSIS — G20 Parkinson's disease: Secondary | ICD-10-CM | POA: Diagnosis present

## 2020-11-09 DIAGNOSIS — E785 Hyperlipidemia, unspecified: Secondary | ICD-10-CM | POA: Diagnosis present

## 2020-11-09 DIAGNOSIS — K5730 Diverticulosis of large intestine without perforation or abscess without bleeding: Secondary | ICD-10-CM | POA: Diagnosis not present

## 2020-11-09 DIAGNOSIS — R5381 Other malaise: Secondary | ICD-10-CM | POA: Diagnosis present

## 2020-11-09 DIAGNOSIS — Z79899 Other long term (current) drug therapy: Secondary | ICD-10-CM

## 2020-11-09 DIAGNOSIS — Z87891 Personal history of nicotine dependence: Secondary | ICD-10-CM

## 2020-11-09 DIAGNOSIS — Z86718 Personal history of other venous thrombosis and embolism: Secondary | ICD-10-CM

## 2020-11-09 DIAGNOSIS — R6520 Severe sepsis without septic shock: Secondary | ICD-10-CM | POA: Diagnosis present

## 2020-11-09 DIAGNOSIS — R339 Retention of urine, unspecified: Secondary | ICD-10-CM | POA: Diagnosis present

## 2020-11-09 DIAGNOSIS — I6930 Unspecified sequelae of cerebral infarction: Secondary | ICD-10-CM

## 2020-11-09 DIAGNOSIS — Y738 Miscellaneous gastroenterology and urology devices associated with adverse incidents, not elsewhere classified: Secondary | ICD-10-CM | POA: Diagnosis present

## 2020-11-09 DIAGNOSIS — Z888 Allergy status to other drugs, medicaments and biological substances status: Secondary | ICD-10-CM

## 2020-11-09 DIAGNOSIS — Z801 Family history of malignant neoplasm of trachea, bronchus and lung: Secondary | ICD-10-CM

## 2020-11-09 DIAGNOSIS — E875 Hyperkalemia: Secondary | ICD-10-CM | POA: Diagnosis present

## 2020-11-09 DIAGNOSIS — Z820 Family history of epilepsy and other diseases of the nervous system: Secondary | ICD-10-CM

## 2020-11-09 DIAGNOSIS — Z87440 Personal history of urinary (tract) infections: Secondary | ICD-10-CM | POA: Diagnosis not present

## 2020-11-09 DIAGNOSIS — I69354 Hemiplegia and hemiparesis following cerebral infarction affecting left non-dominant side: Secondary | ICD-10-CM

## 2020-11-09 LAB — CBC WITH DIFFERENTIAL/PLATELET
Abs Immature Granulocytes: 0.03 10*3/uL (ref 0.00–0.07)
Basophils Absolute: 0.1 10*3/uL (ref 0.0–0.1)
Basophils Relative: 1 %
Eosinophils Absolute: 0.1 10*3/uL (ref 0.0–0.5)
Eosinophils Relative: 1 %
HCT: 48.4 % (ref 39.0–52.0)
Hemoglobin: 15.6 g/dL (ref 13.0–17.0)
Immature Granulocytes: 0 %
Lymphocytes Relative: 10 %
Lymphs Abs: 1.1 10*3/uL (ref 0.7–4.0)
MCH: 29.7 pg (ref 26.0–34.0)
MCHC: 32.2 g/dL (ref 30.0–36.0)
MCV: 92 fL (ref 80.0–100.0)
Monocytes Absolute: 1.4 10*3/uL — ABNORMAL HIGH (ref 0.1–1.0)
Monocytes Relative: 13 %
Neutro Abs: 8 10*3/uL — ABNORMAL HIGH (ref 1.7–7.7)
Neutrophils Relative %: 75 %
Platelets: 242 10*3/uL (ref 150–400)
RBC: 5.26 MIL/uL (ref 4.22–5.81)
RDW: 13.4 % (ref 11.5–15.5)
WBC: 10.7 10*3/uL — ABNORMAL HIGH (ref 4.0–10.5)
nRBC: 0 % (ref 0.0–0.2)

## 2020-11-09 LAB — PROTIME-INR
INR: 1.2 (ref 0.8–1.2)
Prothrombin Time: 14.5 seconds (ref 11.4–15.2)

## 2020-11-09 LAB — LACTIC ACID, PLASMA: Lactic Acid, Venous: 2.3 mmol/L (ref 0.5–1.9)

## 2020-11-09 LAB — COMPREHENSIVE METABOLIC PANEL
ALT: 83 U/L — ABNORMAL HIGH (ref 0–44)
AST: 65 U/L — ABNORMAL HIGH (ref 15–41)
Albumin: 3.7 g/dL (ref 3.5–5.0)
Alkaline Phosphatase: 107 U/L (ref 38–126)
Anion gap: 11 (ref 5–15)
BUN: 11 mg/dL (ref 8–23)
CO2: 21 mmol/L — ABNORMAL LOW (ref 22–32)
Calcium: 9.3 mg/dL (ref 8.9–10.3)
Chloride: 102 mmol/L (ref 98–111)
Creatinine, Ser: 1.68 mg/dL — ABNORMAL HIGH (ref 0.61–1.24)
GFR, Estimated: 43 mL/min — ABNORMAL LOW (ref >60)
Glucose, Bld: 220 mg/dL — ABNORMAL HIGH (ref 70–99)
Potassium: 5.2 mmol/L — ABNORMAL HIGH (ref 3.5–5.1)
Sodium: 134 mmol/L — ABNORMAL LOW (ref 135–145)
Total Bilirubin: 1.3 mg/dL — ABNORMAL HIGH (ref 0.3–1.2)
Total Protein: 7.5 g/dL (ref 6.5–8.1)

## 2020-11-09 LAB — URINALYSIS, ROUTINE W REFLEX MICROSCOPIC
Bilirubin Urine: NEGATIVE
Glucose, UA: 50 mg/dL — AB
Ketones, ur: NEGATIVE mg/dL
Nitrite: POSITIVE — AB
Protein, ur: 100 mg/dL — AB
RBC / HPF: >50 RBC/hpf — ABNORMAL HIGH (ref 0–5)
Specific Gravity, Urine: 1.013 (ref 1.005–1.030)
WBC, UA: >50 WBC/hpf — ABNORMAL HIGH (ref 0–5)
pH: 5 (ref 5.0–8.0)

## 2020-11-09 LAB — APTT: aPTT: 31 seconds (ref 24–36)

## 2020-11-09 LAB — RESP PANEL BY RT-PCR (FLU A&B, COVID) ARPGX2
Influenza A by PCR: NEGATIVE
Influenza B by PCR: NEGATIVE
SARS Coronavirus 2 by RT PCR: NEGATIVE

## 2020-11-09 MED ORDER — SODIUM CHLORIDE 0.9 % IV SOLN
2.0000 g | Freq: Once | INTRAVENOUS | Status: AC
  Administered 2020-11-10: 2 g via INTRAVENOUS
  Filled 2020-11-09: qty 2

## 2020-11-09 MED ORDER — LACTATED RINGERS IV SOLN: INTRAVENOUS | Status: DC

## 2020-11-09 MED ORDER — LACTATED RINGERS IV BOLUS
1000.0000 mL | Freq: Once | INTRAVENOUS | Status: AC
  Administered 2020-11-09: 1000 mL via INTRAVENOUS

## 2020-11-09 MED ORDER — SODIUM CHLORIDE 0.9 % IV SOLN: 1.0000 g | Freq: Once | INTRAVENOUS | Status: DC

## 2020-11-09 NOTE — Progress Notes (Signed)
A consult was received from an ED physician for Cefepime per pharmacy dosing.  The patient's profile has been reviewed for ht/wt/allergies/indication/available labs.    A one time order has been placed for Cefepime 2g IV.  Further antibiotics/pharmacy consults should be ordered by admitting physician if indicated.                       Thank you, Luiz Ochoa 11/09/2020  11:10 PM

## 2020-11-09 NOTE — ED Notes (Signed)
Patient transported to CT 

## 2020-11-09 NOTE — ED Triage Notes (Signed)
Pt BIB EMS from home. Per EMS, family states the pt is currently being treated for UTI and since starting abx, pts parkinsons has worsened and pt now has diarrhea. Non ambulatory. GCS 14.

## 2020-11-09 NOTE — ED Provider Notes (Signed)
[facility] DEPT Provider Note   CSN: [id number] Arrival date & time: 11/09/20  2112     History Chief Complaint  Patient presents with   Medication Reaction   Diarrhea    Robert [name] is a 75 y.o. male.  He has a history of stroke which left him with left arm weakness and left foot drop.  History of parkinsonism, chronic Foley.  Per EMS patient is being treated for a UTI and since starting that has had worsening of his Parkinson symptoms and diarrhea.  Low-grade fever.  Patient states he vomited once.  Tremor is worse.  He is on anticoagulation.  He complains of back pain and pain into his penis.  The history is provided by the patient.  Diarrhea Quality:  Unable to specify Severity:  Moderate Onset quality:  Gradual Timing:  Intermittent Progression:  Unchanged Relieved by:  None tried Worsened by:  Nothing Ineffective treatments:  None tried Associated symptoms: abdominal pain, fever and vomiting   Associated symptoms: no headaches and no myalgias        Past Medical History:  Diagnosis Date   BPH (benign prostatic hyperplasia)    CVA (cerebral vascular accident) ([place])    with left sided hemiparesis   Diverticulosis    Frequency of urination    GERD (gastroesophageal reflux disease)    Gross hematuria    History of acute pyelonephritis    10-13-2012   History of CVA with residual deficit    2008--  left side of body weakness and foot drop (wears leg brace and uses cane)   History of DVT of lower extremity    2008--  cva   Hypercholesteremia    Hyperlipidemia    Hyperlipidemia    Hypertension    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 2008   Left leg DVT (HCC)    S/P insertion of IVC (inferior vena caval) filter    2008   Urethral stricture    Urgency of urination    Urinary retention    Weakness of left side of body    secondary to cva 2008   Wears glasses    Wears hearing aid    bilateral-- wears  intermittantly    Patient Active Problem List   Diagnosis Date Noted   Acute deep vein thrombosis (DVT) of femoral vein of left lower extremity (HCC)    Urinary retention 08/14/2019   History of CVA with residual deficit    BPH (benign prostatic hyperplasia)    Orthostatic hypotension 08/13/2019   Hyperlipidemia    Hemiparesis affecting left side as late effect of cerebrovascular accident ([place]) 12/06/2017   Gait abnormality 11/22/2017   UTI (urinary tract infection) 04/22/2017   Acute lower UTI 04/22/2017   Tremor 04/22/2017   Acute pyelonephritis 09/29/2012   Nausea 09/29/2012   Fever 09/29/2012   HTN (hypertension) 09/29/2012   H/O: CVA (cerebrovascular accident) 09/29/2012   Hearing loss 09/29/2012    Past Surgical History:  Procedure Laterality Date   CYSTOSCOPY WITH RETROGRADE URETHROGRAM N/A 10/23/2015   Procedure: CYSTOSCOPY WITH RETROGRADE URETHROGRAM;  Surgeon: [name], MD;  Location: [facility];  Service: Urology;  Laterality: N/A;   CYSTOSCOPY WITH URETHRAL DILATATION N/A 10/23/2015   Procedure: CYSTOSCOPY WITH URETHRAL BALLOON DILATATION;  Surgeon: [name], MD;  Location: [facility];  Service: Urology;  Laterality: N/A;  BALLOON DILATION    IVC FILTER PLACEMENT ([place] [facility]X)  2008  Family History  Problem Relation Age of Onset   Diabetes Sister    Lung cancer Brother        Post 9/11 voluntary work in [place]    Other Mother        passed away young- non medical   Alzheimer's disease Father    Healthy Son     Social History   Tobacco Use   Smoking status: Former Smoker    Years: 10.00    Types: Cigarettes    Quit date: 10/26/1970    Years since quitting: 50.0   Smokeless tobacco: Never Used  Vaping Use   Vaping Use: Never used  Substance Use Topics   Alcohol use: No   Drug use: No    Home Medications Prior to Admission medications   Medication Sig  Start Date End Date Taking? Authorizing Provider  acetaminophen (TYLENOL) 500 MG tablet Take 1,000 mg by mouth every 6 (six) hours as needed (shoulder pain).    [provider]  ALPRAZolam Duanne Moron) 0.5 MG tablet Take 0.5 mg by mouth as needed for anxiety.     [provider]  apixaban (ELIQUIS) 5 MG TABS tablet Take 1 tablet (5 mg total) by mouth 2 (two) times daily. 08/22/19 02/23/20  Donne Hazel, MD  apixaban (ELIQUIS) 5 MG TABS tablet Take 2 tablets (10 mg total) by mouth 2 (two) times daily for 2 days. Continue through 10/26, then start 5mg  BID dosing on 10/27 08/19/19 08/21/19  Donne Hazel, MD  bethanechol (URECHOLINE) 10 MG tablet Take 10 mg by mouth in the morning and at bedtime.    [provider]  Carbidopa-Levodopa ER (SINEMET CR) 25-100 MG tablet controlled release TAKE TWO TABLETS BY MOUTH THREE TIMES DAILY  03/05/20   Tat, Eustace Quail, DO  cephALEXin (KEFLEX) 500 MG capsule Take 1 capsule (500 mg total) by mouth 4 (four) times daily. Patient not taking: Reported on 02/20/2020 02/07/20   Maudie Flakes, MD  finasteride (PROSCAR) 5 MG tablet Take 5 mg by mouth daily.    [provider]  HYDROcodone-acetaminophen (NORCO/VICODIN) 5-325 MG tablet Take 1 tablet by mouth every 6 (six) hours as needed for severe pain. 02/23/20   Dorie Rank, MD  lidocaine (LIDODERM) 5 % Place 1 patch onto the skin daily. Remove & Discard patch within 12 hours or as directed by MD 02/23/20   Dorie Rank, MD  losartan (COZAAR) 50 MG tablet Take 25 mg by mouth daily.     [provider]  Pimavanserin Tartrate (NUPLAZID) 34 MG CAPS Take 1 capsule by mouth daily.    [provider]  polyethylene glycol (MIRALAX) packet Take 17 g by mouth daily. Patient taking differently: Take 17 g by mouth daily as needed for mild constipation.  05/08/17   Mackuen, Courteney Lyn, MD  rasagiline (AZILECT) 0.5 MG TABS tablet Take 1 mg by mouth daily.    [provider]   simvastatin (ZOCOR) 20 MG tablet Take 20 mg by mouth every evening.    [provider]  tamsulosin (FLOMAX) 0.4 MG CAPS capsule Take 0.4 mg by mouth daily.    [provider]    Allergies    Propofol  Review of Systems   Review of Systems  Constitutional: Positive for fever.  HENT: Negative for sore throat.   Eyes: Negative for pain.  Respiratory: Negative for shortness of breath.   Cardiovascular: Negative for chest pain.  Gastrointestinal: Positive for abdominal  pain, diarrhea and vomiting.  Genitourinary: Negative for dysuria.  Musculoskeletal: Positive for back pain. Negative for myalgias.  Skin: Negative for rash.  Neurological: Positive for tremors. Negative for headaches.    Physical Exam Updated Vital Signs BP (!) 169/90 (BP Location: Right Arm)    Pulse (!) 127    Temp 99.2 F (37.3 C) (Oral)    Resp (!) 23    Ht 6\' 2"  (1.88 m)    Wt 108.9 kg    SpO2 97%    BMI 30.81 kg/m   Physical Exam Vitals and nursing note reviewed.  Constitutional:      Appearance: Normal appearance. He is well-developed and well-nourished.  HENT:     Head: Normocephalic and atraumatic.  Eyes:     Conjunctiva/sclera: Conjunctivae normal.  Cardiovascular:     Rate and Rhythm: Regular rhythm. Tachycardia present.     Heart sounds: No murmur heard.   Pulmonary:     Effort: Pulmonary effort is normal. No respiratory distress.     Breath sounds: Normal breath sounds.  Abdominal:     Palpations: Abdomen is soft.     Tenderness: There is no abdominal tenderness. There is no guarding or rebound.  Musculoskeletal:     Cervical back: Neck supple.     Right lower leg: Edema present.     Left lower leg: Edema present.  Skin:    General: Skin is warm and dry.     Capillary Refill: Capillary refill takes less than 2 seconds.  Neurological:     Mental Status: He is alert. Mental status is at baseline.     Comments: Patient is awake and alert.  He has contractures of his left  upper extremity.  Left lower extremity a brace.  Tremor right side.  Psychiatric:        Mood and Affect: Mood and affect normal.     ED Results / Procedures / Treatments   Labs (all labs ordered are listed, but only abnormal results are displayed) Labs Reviewed  URINE CULTURE  CULTURE, BLOOD (ROUTINE X 2)  CULTURE, BLOOD (ROUTINE X 2)  GASTROINTESTINAL PANEL BY PCR, STOOL (REPLACES STOOL CULTURE)  C DIFFICILE (CDIFF) QUICK SCRN (NO PCR REFLEX)  LACTIC ACID, PLASMA  LACTIC ACID, PLASMA  COMPREHENSIVE METABOLIC PANEL  CBC WITH DIFFERENTIAL/PLATELET  PROTIME-INR  APTT  URINALYSIS, ROUTINE W REFLEX MICROSCOPIC    EKG None  Radiology No results found.  Procedures .Critical Care Performed by: Hayden Rasmussen, MD Authorized by: Hayden Rasmussen, MD   Critical care provider statement:    Critical care time (minutes):  45   Critical care time was exclusive of:  Separately billable procedures and treating other patients   Critical care was necessary to treat or prevent imminent or life-threatening deterioration of the following conditions:  Sepsis   Critical care was time spent personally by me on the following activities:  Discussions with consultants, evaluation of patient's response to treatment, examination of patient, ordering and performing treatments and interventions, ordering and review of laboratory studies, ordering and review of radiographic studies, pulse oximetry, re-evaluation of patient's condition, obtaining history from patient or surrogate, review of old charts and development of treatment plan with patient or surrogate   (including critical care time)  Medications Ordered in ED Medications  simvastatin (ZOCOR) tablet 20 mg (has no administration in time range)  busPIRone (BUSPAR) tablet 7.5 mg (has no administration in time range)  Pimavanserin Tartrate CAPS 34 mg (has no administration in time  range)  tamsulosin (FLOMAX) capsule 0.4 mg (has no  administration in time range)  apixaban (ELIQUIS) tablet 5 mg (has no administration in time range)  Carbidopa-Levodopa ER (SINEMET CR) 25-100 MG tablet controlled release 2 tablet (2 tablets Oral Given 11/10/20 0905)  rasagiline (AZILECT) tablet 1 mg (has no administration in time range)  insulin aspart (novoLOG) injection 0-6 Units (1 Units Subcutaneous Given 11/10/20 0907)  insulin aspart (novoLOG) injection 0-5 Units (0 Units Subcutaneous Not Given 11/10/20 0244)  lactated ringers infusion ( Intravenous New Bag/Given 11/10/20 0548)  acetaminophen (TYLENOL) tablet 650 mg (has no administration in time range)    Or  acetaminophen (TYLENOL) suppository 650 mg (has no administration in time range)  HYDROcodone-acetaminophen (NORCO/VICODIN) 5-325 MG per tablet 1 tablet (1 tablet Oral Given 11/10/20 0249)  polyethylene glycol (MIRALAX / GLYCOLAX) packet 17 g (has no administration in time range)  ondansetron (ZOFRAN) tablet 4 mg (has no administration in time range)    Or  ondansetron (ZOFRAN) injection 4 mg (has no administration in time range)  labetalol (NORMODYNE) injection 10 mg (has no administration in time range)  ceFEPIme (MAXIPIME) 2 g in sodium chloride 0.9 % 100 mL IVPB (has no administration in time range)  Carbidopa-Levodopa ER (SINEMET CR) 25-100 MG tablet controlled release 1 tablet (has no administration in time range)  lactated ringers bolus 1,000 mL (0 mLs Intravenous Stopped 11/10/20 0106)  ceFEPIme (MAXIPIME) 2 g in sodium chloride 0.9 % 100 mL IVPB (0 g Intravenous Stopped 11/10/20 0106)    ED Course  I have reviewed the triage vital signs and the nursing notes.  Pertinent labs & imaging results that were available during my care of the patient were reviewed by me and considered in my medical decision making (see chart for details).  Clinical Course as of 11/10/20 0944  Sat Nov 09, 2020  2227 Wife is here now.  She is able to fill in some details.  Started on antibiotics 2  days ago (Bactrim).  Since yesterday has had 5 daily loose bowel movements.  Low-grade fever to 100.  No blood in the bowel movements.  Also complaining of some low back pain. [MB]    Clinical Course User Index [MB] Hayden Rasmussen, MD   MDM Rules/Calculators/A&P                         Chaze Hruska was evaluated in Emergency Department on 11/09/2020 for the symptoms described in the history of present illness. He was evaluated in the context of the global COVID-19 pandemic, which necessitated consideration that the patient might be at risk for infection with the SARS-CoV-2 virus that causes COVID-19. Institutional protocols and algorithms that pertain to the evaluation of patients at risk for COVID-19 are in a state of rapid change based on information released by regulatory bodies including the CDC and federal and state organizations. These policies and algorithms were followed during the patient's care in the ED.  This patient complains of low-grade fever back pain generalized weakness increased tremor; this involves an extensive number of treatment Options and is a complaint that carries with it a high risk of complications and Morbidity. The differential includes sepsis, Sirs, UTI, pyelonephritis, renal colic  I ordered, reviewed and interpreted labs, which included CBC with mildly elevated white count, stable hemoglobin, chemistries with acute AKI, mild hyperkalemia, urinalysis grossly infected nitrite positive although chronic Foley I ordered medication IV fluids for treatment of sepsis, IV antibiotics I  ordered imaging studies which included chest x-ray and CT KUB and I independently    visualized and interpreted imaging which showed atelectasis left base, bladder distention and malpositioned Foley Additional history obtained from patient's wife.  She said he recently had of catheter exchange and since then patient's symptoms have escalated Previous records obtained and reviewed in epic,  no recent admissions  Critical Interventions: Aggressive treatment of patient's sepsis with IV antibiotics and fluids  After the interventions stated above, I reevaluated the patient and found patient still to be tachycardic and symptomatic.  His care was signed out to oncoming provider Dr. Leonette Monarch to follow-up on results of CT and to have the patient admitted   Final Clinical Impression(s) / ED Diagnoses Final diagnoses:  Diarrhea, unspecified type  AKI (acute kidney injury) ([place])  SIRS (systemic inflammatory response syndrome) ([place])  Urinary tract infection associated with indwelling urethral catheter, initial encounter ([place])  Other hydronephrosis    Rx / DC Orders ED Discharge Orders    None       Hayden Rasmussen, MD 11/10/20 [phone]

## 2020-11-10 ENCOUNTER — Encounter (HOSPITAL_COMMUNITY): Payer: Self-pay | Admitting: Family Medicine

## 2020-11-10 DIAGNOSIS — T83511A Infection and inflammatory reaction due to indwelling urethral catheter, initial encounter: Secondary | ICD-10-CM | POA: Diagnosis present

## 2020-11-10 DIAGNOSIS — A419 Sepsis, unspecified organism: Secondary | ICD-10-CM | POA: Diagnosis present

## 2020-11-10 DIAGNOSIS — R7989 Other specified abnormal findings of blood chemistry: Secondary | ICD-10-CM | POA: Diagnosis present

## 2020-11-10 DIAGNOSIS — R339 Retention of urine, unspecified: Secondary | ICD-10-CM | POA: Diagnosis not present

## 2020-11-10 DIAGNOSIS — R338 Other retention of urine: Secondary | ICD-10-CM | POA: Diagnosis present

## 2020-11-10 DIAGNOSIS — Z7401 Bed confinement status: Secondary | ICD-10-CM | POA: Diagnosis not present

## 2020-11-10 DIAGNOSIS — R197 Diarrhea, unspecified: Secondary | ICD-10-CM | POA: Diagnosis not present

## 2020-11-10 DIAGNOSIS — I69354 Hemiplegia and hemiparesis following cerebral infarction affecting left non-dominant side: Secondary | ICD-10-CM | POA: Diagnosis not present

## 2020-11-10 DIAGNOSIS — T83021A Displacement of indwelling urethral catheter, initial encounter: Secondary | ICD-10-CM | POA: Diagnosis present

## 2020-11-10 DIAGNOSIS — Z87891 Personal history of nicotine dependence: Secondary | ICD-10-CM | POA: Diagnosis not present

## 2020-11-10 DIAGNOSIS — R5381 Other malaise: Secondary | ICD-10-CM | POA: Diagnosis present

## 2020-11-10 DIAGNOSIS — Z974 Presence of external hearing-aid: Secondary | ICD-10-CM | POA: Diagnosis not present

## 2020-11-10 DIAGNOSIS — Z86718 Personal history of other venous thrombosis and embolism: Secondary | ICD-10-CM | POA: Diagnosis not present

## 2020-11-10 DIAGNOSIS — Y846 Urinary catheterization as the cause of abnormal reaction of the patient, or of later complication, without mention of misadventure at the time of the procedure: Secondary | ICD-10-CM | POA: Diagnosis present

## 2020-11-10 DIAGNOSIS — E875 Hyperkalemia: Secondary | ICD-10-CM | POA: Diagnosis present

## 2020-11-10 DIAGNOSIS — R6520 Severe sepsis without septic shock: Secondary | ICD-10-CM | POA: Diagnosis present

## 2020-11-10 DIAGNOSIS — I6930 Unspecified sequelae of cerebral infarction: Secondary | ICD-10-CM

## 2020-11-10 DIAGNOSIS — Z7902 Long term (current) use of antithrombotics/antiplatelets: Secondary | ICD-10-CM | POA: Diagnosis not present

## 2020-11-10 DIAGNOSIS — Z20822 Contact with and (suspected) exposure to covid-19: Secondary | ICD-10-CM | POA: Diagnosis present

## 2020-11-10 DIAGNOSIS — K219 Gastro-esophageal reflux disease without esophagitis: Secondary | ICD-10-CM | POA: Diagnosis present

## 2020-11-10 DIAGNOSIS — N136 Pyonephrosis: Secondary | ICD-10-CM | POA: Diagnosis present

## 2020-11-10 DIAGNOSIS — R739 Hyperglycemia, unspecified: Secondary | ICD-10-CM | POA: Diagnosis present

## 2020-11-10 DIAGNOSIS — N390 Urinary tract infection, site not specified: Secondary | ICD-10-CM | POA: Diagnosis present

## 2020-11-10 DIAGNOSIS — N179 Acute kidney failure, unspecified: Secondary | ICD-10-CM | POA: Diagnosis present

## 2020-11-10 DIAGNOSIS — E1165 Type 2 diabetes mellitus with hyperglycemia: Secondary | ICD-10-CM | POA: Diagnosis present

## 2020-11-10 DIAGNOSIS — N1339 Other hydronephrosis: Secondary | ICD-10-CM | POA: Insufficient documentation

## 2020-11-10 DIAGNOSIS — Z79899 Other long term (current) drug therapy: Secondary | ICD-10-CM | POA: Diagnosis not present

## 2020-11-10 DIAGNOSIS — M2550 Pain in unspecified joint: Secondary | ICD-10-CM | POA: Diagnosis not present

## 2020-11-10 DIAGNOSIS — G20 Parkinson's disease: Secondary | ICD-10-CM | POA: Diagnosis present

## 2020-11-10 DIAGNOSIS — N401 Enlarged prostate with lower urinary tract symptoms: Secondary | ICD-10-CM | POA: Diagnosis present

## 2020-11-10 DIAGNOSIS — G20A1 Parkinson's disease without dyskinesia, without mention of fluctuations: Secondary | ICD-10-CM | POA: Diagnosis present

## 2020-11-10 DIAGNOSIS — Z888 Allergy status to other drugs, medicaments and biological substances status: Secondary | ICD-10-CM | POA: Diagnosis not present

## 2020-11-10 DIAGNOSIS — Y738 Miscellaneous gastroenterology and urology devices associated with adverse incidents, not elsewhere classified: Secondary | ICD-10-CM | POA: Diagnosis present

## 2020-11-10 DIAGNOSIS — I10 Essential (primary) hypertension: Secondary | ICD-10-CM | POA: Diagnosis present

## 2020-11-10 DIAGNOSIS — E785 Hyperlipidemia, unspecified: Secondary | ICD-10-CM | POA: Diagnosis present

## 2020-11-10 LAB — CBC
HCT: 39.6 % (ref 39.0–52.0)
Hemoglobin: 13 g/dL (ref 13.0–17.0)
MCH: 29.5 pg (ref 26.0–34.0)
MCHC: 32.8 g/dL (ref 30.0–36.0)
MCV: 89.8 fL (ref 80.0–100.0)
Platelets: 282 10*3/uL (ref 150–400)
RBC: 4.41 MIL/uL (ref 4.22–5.81)
RDW: 13.6 % (ref 11.5–15.5)
WBC: 9.2 10*3/uL (ref 4.0–10.5)
nRBC: 0 % (ref 0.0–0.2)

## 2020-11-10 LAB — CBG MONITORING, ED
Glucose-Capillary: 195 mg/dL — ABNORMAL HIGH (ref 70–99)
Glucose-Capillary: 158 mg/dL — ABNORMAL HIGH (ref 70–99)
Glucose-Capillary: 164 mg/dL — ABNORMAL HIGH (ref 70–99)
Glucose-Capillary: 137 mg/dL — ABNORMAL HIGH (ref 70–99)
Glucose-Capillary: 165 mg/dL — ABNORMAL HIGH (ref 70–99)

## 2020-11-10 LAB — COMPREHENSIVE METABOLIC PANEL
ALT: 48 U/L — ABNORMAL HIGH (ref 0–44)
AST: 44 U/L — ABNORMAL HIGH (ref 15–41)
Albumin: 3.1 g/dL — ABNORMAL LOW (ref 3.5–5.0)
Alkaline Phosphatase: 88 U/L (ref 38–126)
Anion gap: 9 (ref 5–15)
BUN: 10 mg/dL (ref 8–23)
CO2: 24 mmol/L (ref 22–32)
Calcium: 9.1 mg/dL (ref 8.9–10.3)
Chloride: 105 mmol/L (ref 98–111)
Creatinine, Ser: 1.31 mg/dL — ABNORMAL HIGH (ref 0.61–1.24)
GFR, Estimated: 57 mL/min — ABNORMAL LOW (ref >60)
Glucose, Bld: 173 mg/dL — ABNORMAL HIGH (ref 70–99)
Potassium: 3.9 mmol/L (ref 3.5–5.1)
Sodium: 138 mmol/L (ref 135–145)
Total Bilirubin: 1 mg/dL (ref 0.3–1.2)
Total Protein: 6.7 g/dL (ref 6.5–8.1)

## 2020-11-10 LAB — HEMOGLOBIN A1C
Hgb A1c MFr Bld: 6.7 % — ABNORMAL HIGH (ref 4.8–5.6)
Mean Plasma Glucose: 145.59 mg/dL

## 2020-11-10 LAB — LACTIC ACID, PLASMA: Lactic Acid, Venous: 1.9 mmol/L (ref 0.5–1.9)

## 2020-11-10 MED ORDER — ACETAMINOPHEN 325 MG PO TABS: 650.0000 mg | ORAL_TABLET | Freq: Four times a day (QID) | ORAL | Status: DC | PRN

## 2020-11-10 MED ORDER — APIXABAN 5 MG PO TABS
5.0000 mg | ORAL_TABLET | Freq: Two times a day (BID) | ORAL | Status: DC
  Administered 2020-11-10 – 2020-11-12 (×5): 5 mg via ORAL
  Filled 2020-11-10 (×6): qty 1

## 2020-11-10 MED ORDER — NAPHAZOLINE-GLYCERIN 0.012-0.2 % OP SOLN
1.0000 [drp] | Freq: Four times a day (QID) | OPHTHALMIC | Status: DC | PRN
  Administered 2020-11-11: 2 [drp] via OPHTHALMIC
  Filled 2020-11-10: qty 15

## 2020-11-10 MED ORDER — ONDANSETRON HCL 4 MG/2ML IJ SOLN: 4.0000 mg | Freq: Four times a day (QID) | INTRAMUSCULAR | Status: DC | PRN

## 2020-11-10 MED ORDER — TAMSULOSIN HCL 0.4 MG PO CAPS
0.4000 mg | ORAL_CAPSULE | Freq: Every day | ORAL | Status: DC
Start: 2020-11-10 — End: 2020-11-12
  Administered 2020-11-10 – 2020-11-12 (×3): 0.4 mg via ORAL
  Filled 2020-11-10 (×3): qty 1

## 2020-11-10 MED ORDER — LABETALOL HCL 5 MG/ML IV SOLN
10.0000 mg | INTRAVENOUS | Status: DC | PRN
  Filled 2020-11-10: qty 4

## 2020-11-10 MED ORDER — SODIUM CHLORIDE 0.9 % IV SOLN
2.0000 g | Freq: Two times a day (BID) | INTRAVENOUS | Status: DC
  Administered 2020-11-10: 2 g via INTRAVENOUS
  Filled 2020-11-10: qty 2

## 2020-11-10 MED ORDER — ONDANSETRON HCL 4 MG PO TABS: 4.0000 mg | ORAL_TABLET | Freq: Four times a day (QID) | ORAL | Status: DC | PRN

## 2020-11-10 MED ORDER — LACTATED RINGERS IV SOLN: INTRAVENOUS | Status: AC

## 2020-11-10 MED ORDER — POLYETHYLENE GLYCOL 3350 17 G PO PACK
17.0000 g | PACK | Freq: Every day | ORAL | Status: DC | PRN
  Filled 2020-11-10: qty 1

## 2020-11-10 MED ORDER — ACETAMINOPHEN 650 MG RE SUPP: 650.0000 mg | Freq: Four times a day (QID) | RECTAL | Status: DC | PRN

## 2020-11-10 MED ORDER — RASAGILINE MESYLATE 1 MG PO TABS
1.0000 mg | ORAL_TABLET | Freq: Every day | ORAL | Status: DC
  Administered 2020-11-11 – 2020-11-12 (×2): 1 mg via ORAL
  Filled 2020-11-10 (×4): qty 1

## 2020-11-10 MED ORDER — INSULIN ASPART 100 UNIT/ML ~~LOC~~ SOLN
0.0000 [IU] | Freq: Three times a day (TID) | SUBCUTANEOUS | Status: DC
  Administered 2020-11-10 – 2020-11-12 (×4): 1 [IU] via SUBCUTANEOUS
  Filled 2020-11-10: qty 0.06

## 2020-11-10 MED ORDER — CARBIDOPA-LEVODOPA ER 25-100 MG PO TBCR
2.0000 | EXTENDED_RELEASE_TABLET | Freq: Two times a day (BID) | ORAL | Status: DC
  Administered 2020-11-10 – 2020-11-12 (×6): 2 via ORAL
  Filled 2020-11-10 (×7): qty 2

## 2020-11-10 MED ORDER — SODIUM CHLORIDE 0.9 % IV SOLN
2.0000 g | Freq: Three times a day (TID) | INTRAVENOUS | Status: DC
  Administered 2020-11-10 – 2020-11-11 (×4): 2 g via INTRAVENOUS
  Filled 2020-11-10 (×6): qty 2

## 2020-11-10 MED ORDER — SIMVASTATIN 20 MG PO TABS
20.0000 mg | ORAL_TABLET | Freq: Every evening | ORAL | Status: DC
  Administered 2020-11-10: 20 mg via ORAL
  Filled 2020-11-10: qty 1

## 2020-11-10 MED ORDER — HYDROCODONE-ACETAMINOPHEN 5-325 MG PO TABS
1.0000 | ORAL_TABLET | Freq: Four times a day (QID) | ORAL | Status: DC | PRN
  Administered 2020-11-10: 1 via ORAL
  Filled 2020-11-10: qty 1

## 2020-11-10 MED ORDER — PIMAVANSERIN TARTRATE 34 MG PO CAPS
1.0000 | ORAL_CAPSULE | Freq: Every day | ORAL | Status: DC
  Administered 2020-11-10 – 2020-11-12 (×3): 34 mg via ORAL

## 2020-11-10 MED ORDER — BUSPIRONE HCL 5 MG PO TABS
7.5000 mg | ORAL_TABLET | Freq: Two times a day (BID) | ORAL | Status: DC
  Administered 2020-11-10 – 2020-11-12 (×5): 7.5 mg via ORAL
  Filled 2020-11-10: qty 2
  Filled 2020-11-10: qty 1.5
  Filled 2020-11-10: qty 2
  Filled 2020-11-10 (×3): qty 1.5

## 2020-11-10 MED ORDER — INSULIN ASPART 100 UNIT/ML ~~LOC~~ SOLN
0.0000 [IU] | Freq: Every day | SUBCUTANEOUS | Status: DC
  Filled 2020-11-10: qty 0.05

## 2020-11-10 MED ORDER — LIVING WELL WITH DIABETES BOOK
Freq: Once | Status: DC
  Filled 2020-11-10: qty 1

## 2020-11-10 MED ORDER — CARBIDOPA-LEVODOPA ER 25-100 MG PO TBCR
1.0000 | EXTENDED_RELEASE_TABLET | Freq: Every day | ORAL | Status: DC
  Administered 2020-11-10 – 2020-11-11 (×2): 1 via ORAL
  Filled 2020-11-10 (×2): qty 1

## 2020-11-10 NOTE — H&P (Signed)
History and Physical    Robert [name] [id number] DOB: 04/11/1946 DOA: 11/09/2020  PCP: [name], MD   Patient coming from: Home   Chief Complaint: Diarrhea, shaking chills, pain in back, abdomen, and penis  HPI: Robert [name] is a 75 y.o. 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  Patient reports that he had some penile pain since his Foley catheter was last changed roughly 2 weeks ago.  He has been started on Bactrim for UTI and has since developed diarrhea.  He has also had worsening pain in his low back and abdomen and reports chills last night with shaking.  He had seen some blood and decreased urine in his Foley bag lately.  Denies any chest pain, cough, sore throat, or shortness of breath.  ED Course: Upon arrival to the ED, patient is found to be afebrile, saturating well on room air, tachypneic in the mid 20s, tachycardic to 120, and with stable blood pressure.  EKG features sinus tachycardia 326 and significant artifact.  Chest x-ray notable for streaky subsegmental atelectasis at the left base.  CT abdomen and pelvis is notable for moderate bilateral hydroureteronephrosis secondary to obstruction, malposition Foley catheter, and marked distention of the urinary bladder with surrounding fat stranding.  Chemistry panel features a potassium 5.2, creatinine 1.68, glucose 220, and mild elevation in transaminases and bilirubin.  CBC features a mild leukocytosis.  Lactic acid was 2.3 and then down to 1.9.  Blood and urine cultures were collected, Foley catheter was changed out, and the patient was given a liter of LR and cefepime.  C. difficile testing and GI pathogen panel were ordered.  COVID  screening test was negative.  Review of Systems:  All other systems reviewed and apart from HPI, are negative.  Past Medical History:  Diagnosis Date  . BPH (benign prostatic hyperplasia)   . CVA (cerebral vascular accident) ([place])    with left sided hemiparesis  . Diverticulosis   . Frequency of urination   . GERD (gastroesophageal reflux disease)   . Gross hematuria   . History of acute pyelonephritis    10-13-2012  . History of CVA with residual deficit    2008--  left side of body weakness and foot drop (wears leg brace and uses cane)  . History of DVT of lower extremity    2008--  cva  . Hypercholesteremia   . Hyperlipidemia   . Hyperlipidemia   . Hypertension   . 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 Left leg DVT ([place])   . 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 Urethral stricture   . Urgency of urination   . Urinary retention   . Weakness of left side of body    secondary to cva 2008  . Wears glasses   . Wears hearing aid    bilateral-- wears intermittantly    Past Surgical History:  Procedure Laterality Date  . CYSTOSCOPY WITH RETROGRADE URETHROGRAM N/A 10/23/2015   Procedure: CYSTOSCOPY WITH RETROGRADE URETHROGRAM;  Surgeon: [name], MD;  Location: [facility];  Service: Urology;  Laterality: N/A;  . CYSTOSCOPY WITH URETHRAL DILATATION N/A 10/23/2015   Procedure: CYSTOSCOPY WITH URETHRAL BALLOON DILATATION;  Surgeon:  [name], MD;  Location[facility];  Service: Urology;  Laterality: N/A;  BALLOON DILATION   . IVC FILTER PLACEMENT ([place] [facility])  2008    Social History:   reports that he quit smoking about 50 years ago. His smoking use included cigarettes. He quit after 10.00 years of use. He has never used smokeless tobacco. He reports that he does not drink alcohol and does not use drugs.  Allergies  Allergen Reactions  . Propofol Other (See Comments)    "hiccups for weeks"    Family  History  Problem Relation Age of Onset  . Diabetes Sister   . Lung cancer Brother        Post 9/11 voluntary work in [place]  . Prostate cancer Brother   . Other Mother        passed away young- non medical  . Alzheimer's disease Father   . Healthy Son      Prior to Admission medications   Medication Sig Start Date End Date Taking? Authorizing Provider  acetaminophen (TYLENOL) 500 MG tablet Take 1,000 mg by mouth every 6 (six) hours as needed (shoulder pain).   Yes [provider]  apixaban (ELIQUIS) 5 MG TABS tablet Take 1 tablet (5 mg total) by mouth 2 (two) times daily. 08/22/19 02/23/20 Yes Donne Hazel, MD  busPIRone (BUSPAR) 7.5 MG tablet Take 7.5 mg by mouth 2 (two) times daily. 1 and 1/2 tablet(s)   Yes [provider]  Carbidopa-Levodopa ER (SINEMET CR) 25-100 MG tablet controlled release TAKE TWO TABLETS BY MOUTH THREE TIMES DAILY  Patient taking differently: Take 2 tablets by mouth in the morning, at noon, and at bedtime. Takes 2 tablets in the morning, 2 tablets in the afternoon, and 1 tablet at night 03/05/20  Yes Tat, Rebecca S, DO  lidocaine (LIDODERM) 5 % Place 1 patch onto the skin daily. Remove & Discard patch within 12 hours or as directed by MD 02/23/20  Yes Dorie Rank, MD  losartan (COZAAR) 50 MG tablet Take 50 mg by mouth daily.   Yes [provider]  Pimavanserin Tartrate (NUPLAZID) 34 MG CAPS Take 1 capsule by mouth daily.   Yes [provider]  polyethylene glycol (MIRALAX) packet Take 17 g by mouth daily. Patient taking differently: Take 17 g by mouth daily as needed for mild constipation. 05/08/17  Yes Mackuen, Courteney Lyn, MD  rasagiline (AZILECT) 0.5 MG TABS tablet Take 1 mg by mouth daily.   Yes [provider]  simvastatin (ZOCOR) 20 MG tablet Take 20 mg by mouth every evening.   Yes [provider]  tamsulosin (FLOMAX) 0.4 MG CAPS capsule Take 0.4 mg by mouth daily.   Yes [provider]   ALPRAZolam Duanne Moron) 0.5 MG tablet Take 0.5 mg by mouth as needed for anxiety.  Patient not taking: No sig reported    [provider]  apixaban (ELIQUIS) 5 MG TABS tablet Take 2 tablets (10 mg total) by mouth 2 (two) times daily for 2 days. Continue through 10/26, then start 5mg  BID dosing on 10/27 Patient not taking: Reported on 11/10/2020 08/19/19 08/21/19  Donne Hazel, MD  bethanechol (URECHOLINE) 10 MG tablet Take 10 mg by mouth in the morning and at bedtime. Patient not taking: Reported on 11/10/2020    [provider]  cephALEXin (KEFLEX) 500 MG capsule Take 1 capsule (500 mg total) by mouth 4 (four) times daily. Patient not taking: No sig reported 02/07/20   Bero,  Barth Kirks, MD  finasteride (PROSCAR) 5 MG tablet Take 5 mg by mouth daily. Patient not taking: No sig reported    [provider]  HYDROcodone-acetaminophen (NORCO/VICODIN) 5-325 MG tablet Take 1 tablet by mouth every 6 (six) hours as needed for severe pain. Patient not taking: Reported on 11/10/2020 02/23/20   Dorie Rank, MD    Physical Exam: Vitals:   11/09/20 2300 11/09/20 2345 11/10/20 0000 11/10/20 0011  BP: (!) 177/86 139/88 (!) 161/88 (!) 181/88  Pulse: (!) 112 (!) 121 (!) 117   Resp: (!) 28 (!) 25 (!) 29   Temp:      TempSrc:      SpO2: 95% 96% 95%   Weight:      Height:        Constitutional: NAD, calm  Eyes: PERTLA, lids and conjunctivae normal ENMT: Mucous membranes are moist. Posterior pharynx clear of any exudate or lesions.   Neck: normal, supple, no masses, no thyromegaly Respiratory:  no wheezing, no crackles. No accessory muscle use.  Cardiovascular: Rate ~100-120 and regular. Lower leg edema.   Abdomen: No distension, no tenderness, soft. Bowel sounds active.  Musculoskeletal: no clubbing / cyanosis. No joint deformity upper and lower extremities.   Skin: no significant rashes, lesions, ulcers. Warm, dry, well-perfused. Neurologic: Mild dysarthria, no aphasia.  Left-sided hemiplegia. Resting RUE tremor.  Psychiatric: Alert and oriented to person, place, and situation. Very pleasant and cooperative.    Labs and Imaging on Admission: I have personally reviewed following labs and imaging studies  CBC: Recent Labs  Lab 11/09/20 2139  WBC 10.7*  NEUTROABS 8.0*  HGB 15.6  HCT 48.4  MCV 92.0  PLT [id number]   Basic Metabolic Panel: Recent Labs  Lab 11/09/20 2139  NA 134*  K 5.2*  CL 102  CO2 21*  GLUCOSE 220*  BUN 11  CREATININE 1.68*  CALCIUM 9.3   GFR: Estimated Creatinine Clearance: 51.5 mL/min (A) (by C-G formula based on SCr of 1.68 mg/dL (H)). Liver Function Tests: Recent Labs  Lab 11/09/20 2139  AST 65*  ALT 83*  ALKPHOS 107  BILITOT 1.3*  PROT 7.5  ALBUMIN 3.7   No results for input(s): LIPASE, AMYLASE in the last 168 hours. No results for input(s): AMMONIA in the last 168 hours. Coagulation Profile: Recent Labs  Lab 11/09/20 2139  INR 1.2   Cardiac Enzymes: No results for input(s): CKTOTAL, CKMB, CKMBINDEX, TROPONINI in the last 168 hours. BNP (last 3 results) No results for input(s): PROBNP in the last 8760 hours. HbA1C: No results for input(s): HGBA1C in the last 72 hours. CBG: No results for input(s): GLUCAP in the last 168 hours. Lipid Profile: No results for input(s): CHOL, HDL, LDLCALC, TRIG, CHOLHDL, LDLDIRECT in the last 72 hours. Thyroid Function Tests: No results for input(s): TSH, T4TOTAL, FREET4, T3FREE, THYROIDAB in the last 72 hours. Anemia Panel: No results for input(s): VITAMINB12, FOLATE, FERRITIN, TIBC, IRON, RETICCTPCT in the last 72 hours. Urine analysis:    Component Value Date/Time   COLORURINE YELLOW 11/09/2020 2139   APPEARANCEUR TURBID (A) 11/09/2020 2139   LABSPEC 1.013 11/09/2020 2139   PHURINE 5.0 11/09/2020 2139   GLUCOSEU 50 (A) 11/09/2020 2139   GLUCOSEU 100 (A) 02/06/2019 0813   HGBUR LARGE (A) 11/09/2020 2139   BILIRUBINUR NEGATIVE 11/09/2020 2139   KETONESUR NEGATIVE  11/09/2020 2139   PROTEINUR 100 (A) 11/09/2020 2139   UROBILINOGEN 1.0 02/06/2019 0813   NITRITE POSITIVE (A) 11/09/2020 2139   LEUKOCYTESUR LARGE (A)  11/09/2020 2139   Sepsis Labs: @LABRCNTIP (procalcitonin:4,lacticidven:4) ) Recent Results (from the past 240 hour(s))  Blood Culture (routine x 2)     Status: None (Preliminary result)   Collection Time: 11/09/20  9:39 PM   Specimen: Right Antecubital; Blood  Result Value Ref Range Status   Specimen Description   Final    RIGHT ANTECUBITAL BLOOD Performed at [facility] [address] [place], [place] [address]    Special Requests   Final    BOTTLES DRAWN AEROBIC AND ANAEROBIC Blood Culture adequate volume Performed at [facility] [address], [place], [place] [address]    Culture PENDING  Incomplete   Report Status PENDING  Incomplete  Resp Panel by RT-PCR (Flu A&B, Covid) Nasopharyngeal Swab     Status: None   Collection Time: 11/09/20  9:41 PM   Specimen: Nasopharyngeal Swab; Nasopharyngeal(NP) swabs in vial transport medium  Result Value Ref Range Status   SARS Coronavirus 2 by RT PCR NEGATIVE NEGATIVE Final    Comment: (NOTE) SARS-CoV-2 target nucleic acids are NOT DETECTED.  The SARS-CoV-2 RNA is generally detectable in upper respiratory specimens during the acute phase of infection. The lowest concentration of SARS-CoV-2 viral copies this assay can detect is 138 copies/mL. A negative result does not preclude SARS-Cov-2 infection and should not be used as the sole basis for treatment or other patient management decisions. A negative result may occur with  improper specimen collection/handling, submission of specimen other than nasopharyngeal swab, presence of viral mutation(s) within the areas targeted by this assay, and inadequate number of viral copies(<138 copies/mL). A negative result must be combined with clinical observations, patient history, and epidemiological information. The  expected result is Negative.  Fact Sheet for Patients:  EntrepreneurPulse.com.au  Fact Sheet for Healthcare Providers:  IncredibleEmployment.be  This test is no t yet approved or cleared by the [place] FDA and  has been authorized for detection and/or diagnosis of SARS-CoV-2 by FDA under an Emergency Use Authorization (EUA). This EUA will remain  in effect (meaning this test can be used) for the duration of the COVID-19 declaration under Section 564(b)(1) of the Act, 21 U.S.C.section 360bbb-3(b)(1), unless the authorization is terminated  or revoked sooner.       Influenza A by PCR NEGATIVE NEGATIVE Final   Influenza B by PCR NEGATIVE NEGATIVE Final    Comment: (NOTE) The Xpert Xpress SARS-CoV-2/FLU/RSV plus assay is intended as an aid in the diagnosis of influenza from Nasopharyngeal swab specimens and should not be used as a sole basis for treatment. Nasal washings and aspirates are unacceptable for Xpert Xpress SARS-CoV-2/FLU/RSV testing.  Fact Sheet for Patients: EntrepreneurPulse.com.au  Fact Sheet for Healthcare Providers: IncredibleEmployment.be  This test is not yet approved or cleared by the [place] FDA and has been authorized for detection and/or diagnosis of SARS-CoV-2 by FDA under an Emergency Use Authorization (EUA). This EUA will remain in effect (meaning this test can be used) for the duration of the COVID-19 declaration under Section 564(b)(1) of the Act, 21 U.S.C. section 360bbb-3(b)(1), unless the authorization is terminated or revoked.  Performed at [facility], [place] [address] [place], [place] [address]      Radiological Exams on Admission: DG Chest Port 1 View  Result Date: 11/09/2020 CLINICAL DATA:  Possible sepsis EXAM: PORTABLE CHEST 1 VIEW COMPARISON:  None. FINDINGS: The heart size and mediastinal contours are within normal limits. Streaky  airspace opacity seen at the left lung base. The  right lung is clear. No pleural effusion. The visualized skeletal structures are unremarkable. IMPRESSION: Streaky subsegmental atelectasis at the left lung base. Electronically Signed   By: Prudencio Pair M.D.   On: 11/09/2020 22:41   CT Renal Stone Study  Result Date: 11/09/2020 CLINICAL DATA:  Diarrhea. Being treated for urinary tract infection. EXAM: CT ABDOMEN AND PELVIS WITHOUT CONTRAST TECHNIQUE: Multidetector CT imaging of the abdomen and pelvis was performed following the standard protocol without IV contrast. COMPARISON:  May 08, 2017 FINDINGS: Lower chest: No acute abnormality. Hepatobiliary: No focal liver abnormality is seen. Numerous gallstones are seen within the gallbladder lumen. There is no evidence of gallbladder wall thickening, pericholecystic inflammatory fat stranding or biliary dilatation. Pancreas: Unremarkable. No pancreatic ductal dilatation or surrounding inflammatory changes. Spleen: Normal in size without focal abnormality. Adrenals/Urinary Tract: Adrenal glands are unremarkable. Kidneys are normal in size, without focal lesions. There is moderate severity bilateral hydronephrosis and hydroureter, without evidence of obstructing renal calculi. The urinary bladder is markedly distended. A mild amount of adjacent inflammatory fat stranding is noted. Large, stable right urinary bladder diverticula are seen. A Foley catheter is in place with its distal tip and insufflator bulb seen within the expected region of the prostate gland. Stomach/Bowel: Stomach is within normal limits. Appendix appears normal. No evidence of bowel dilatation. Noninflamed diverticula are seen throughout the sigmoid colon. Vascular/Lymphatic: Aortic atherosclerosis. An inferior vena cava filter is present. No enlarged abdominal or pelvic lymph nodes. Reproductive: The prostate gland is mildly enlarged. Other: No abdominal wall hernia or abnormality. No  abdominopelvic ascites. Musculoskeletal: Multilevel degenerative changes seen throughout the lumbar spine. IMPRESSION: 1. Markedly distended urinary bladder with a mild amount of adjacent inflammatory fat stranding, consistent with cystitis. 2. Moderate severity bilateral hydronephrosis and hydroureter, without evidence of obstructing renal calculi. 3. Cholelithiasis without evidence of acute cholecystitis. 4. Sigmoid diverticulosis. 5. Malpositioned Foley catheter, as described above. 6. Aortic atherosclerosis. Aortic Atherosclerosis (ICD10-I70.0). Electronically Signed   By: Virgina Norfolk M.D.   On: 11/09/2020 23:53    EKG: Independently reviewed. Sinus tachycardia, rate 126, artifact.   Assessment/Plan   1. Sepsis secondary to UTI  - Presents with shaking chills and back and abdominal pain despite treatment with Bactrim for UTI and is found to have malpositioned Foley on CT with bladder distension and b/l hydroureteronephrosis  - UA and CT-findigns consistent with infection and there is tachycardia and tachypnea; end-organ damage (AKI) likely related more to obstruction than infection  - Blood and urine cultures were collected, 1 liter LR given with normalization of lactate, and empiric cefepime started  - Continue cefepime while following cultures and clinical course    2. Acute kidney injury; hyperkalemia  - Scr is 1.68, up from apparent baseline <1; potassium is 5.2  - There was distended bladder and b/l hydroureteronephrosis on CT related to malpositioned catheter; he has also had diarrhea and ARB use  - Foley has been replaced in ED, hold losartan, continue IVF hydration, renally-dose medications, repeat chem panel    3. Chronic urinary retention  - Foley replaced in ED, continue catheter care, Flomax    4. Parkinson disease  - Continue Sinemet, rasagiline, pimavanserin   5. Hx of DVT  - Has had recurrent LE DVTs, now on Eliquis indefinitely will continue    6. Hyperglycemia   - Serum glucose is 220 without DM diagnosis, possibly reactive to acute illness  - Check A1c, monitor CBGs, use low-intensity SSI if needed    7. Hypertension  -  Hold losartan until renal function improves, use labetalol as needed for now    8. Hx of CVA  - Stable left hemiparesis  - Continue Eliquis and statin    9. Diarrhea  - Patient reports frequent diarrhea since starting abx for UTI, abd pain now resolved after Foley replaced in ED, no bowel inflammation on CT  - C diff and GI pathogen panel ordered, continue supportive care   10. Elevated LFTs  - Mild elevation in transaminases and t bili noted on admission  - Abdominal pain resolved with Foley was replaced in ED, exam is benign, will trend LFTs    DVT prophylaxis: Eliquis  Code Status: Full, discussed with patient in ED  Family Communication: Discussed with patient  Disposition Plan:  Patient is from: Home  Anticipated d/c is to: TBD Anticipated d/c date is: 11/13/20 Patient currently: pending improvement in renal function and sepsis parameters  Consults called: None  Admission status: Inpatient     Vianne Bulls, MD [facility]  11/10/2020, 1:22 AM

## 2020-11-10 NOTE — ED Notes (Signed)
Pts wife doris  would like update when pt moved to floor. Also if she is needed for anything please call her cell phone.

## 2020-11-10 NOTE — ED Notes (Signed)
Wife at bedside. Home medication brought and labeled with patient sticker.

## 2020-11-10 NOTE — Progress Notes (Signed)
Pharmacy Antibiotic Note  Robert [name] is a 75 y.o. male admitted on 11/09/2020 with sepsis secondary to UTI.  Pharmacy has been consulted for Cefepime dosing.  Plan: Cefepime 2g IV q12h Monitor renal function, cultures, clinical course  Height: 6\' 2"  (188 cm) Weight: 108.9 kg (240 lb) IBW/kg (Calculated) : 82.2  Temp (24hrs), Avg:99.2 F (37.3 C), Min:99.2 F (37.3 C), Max:99.2 F (37.3 C)  Recent Labs  Lab 11/09/20 2139 11/09/20 2339  WBC 10.7*  --   CREATININE 1.68*  --   LATICACIDVEN 2.3* 1.9    Estimated Creatinine Clearance: 51.5 mL/min (A) (by C-G formula based on SCr of 1.68 mg/dL (H)).    Allergies  Allergen Reactions  . Propofol Other (See Comments)    "hiccups for weeks"    Antimicrobials this admission: 1/16 Cefepime >>  Dose adjustments this admission: --  Microbiology results: 1/15 BCx: sent 1/15 UCx: sent  1/15 Respiratory panel: COVID negative, Influenza A/B negative  1/15 GI panel: 1/15 C.diff:   Thank you for allowing pharmacy to be a part of this patient's care.   Lindell Spar, PharmD, BCPS Clinical Pharmacist  11/10/2020 2:04 AM

## 2020-11-10 NOTE — Progress Notes (Signed)
PHARMACY NOTE:  ANTIMICROBIAL RENAL DOSAGE ADJUSTMENT  Current antimicrobial regimen includes a mismatch between antimicrobial dosage and estimated renal function.  As per policy approved by the Pharmacy & Therapeutics and Medical Executive Committees, the antimicrobial dosage will be adjusted accordingly.  Current antimicrobial dosage:  Cefepime 2 g iv q 12 h  Indication: UTI/sepsis   Renal Function:  Estimated Creatinine Clearance: 66 mL/min (A) (by C-G formula based on SCr of 1.31 mg/dL (H)). []      On intermittent HD, scheduled: []      On CRRT    Antimicrobial dosage has been changed to:  Cefepime 2 g iv q8h  Additional comments:   Thank you for allowing pharmacy to be a part of this patient's care.  Napoleon Form, [facility] 11/10/2020 1:36 PM

## 2020-11-10 NOTE — ED Notes (Signed)
Spoke with pt's wife regarding home medication. She is currently unable to bring due to weather. Informed pharmacist.

## 2020-11-10 NOTE — Progress Notes (Addendum)
Same day note  Patient seen and examined at bedside.  Patient was admitted to the hospital for anemia chills pain in the back abdomen  At the time of my evaluation, patient complains of long little better today.  Denies any nausea vomiting fever or abdominal pain.  Physical examination reveals a male with left-sided weakness.  Left leg on a support.  Foley catheter in place.  Trace lower extremity edema.  Laboratory data and imaging was reviewed  Assessment and Plan.  Sepsis secondary to UTI  Patient presented with fever chills pain with mild  fever, tachycardia tachypnea on presentation with abnormal urinalysis.  Lactate was 1.9.  WBC 9.2.  Patient was on Bactrim for UTI as outpatient but despite that patient persisted to have symptoms of he was noted to have malpositioned Foley catheter on CT scan with bladder distention and bilateral hydroureteronephrosis.  Currently on IV cefepime but   Acute kidney injury; hyperkalemia  Serum creatinine of 1.6 on presentation from baseline of around 1.0.  Potassium is elevated.  There was obstructive uropathy from CT scan secondary to malpositioned catheter.  This has been replaced in the ED.  Continue to hold losartan.  Continue IV fluids.  Check BMP in AM.  Chronic urinary retention  On Foley catheter as outpatient.  Malpositioned Foley catheter has been replaced.  Continue Flomax    History of Parkinson disease  - Continue Sinemet, rasagiline, pimavanserin   Hx of recurrent lower extremity DVT  On Eliquis indefinitely  Hyperglycemia  Could be reactive.  No history of diabetes.  Check A1c.    Essential hypertension  On as needed labetalol.  Hold losartan for now    Hx of CVA with residual left-sided hemiparesis. - Continue Eliquis and statin .  Diarrhea  After starting antibiotic for UTI.   Check C diff and GI pathogen panel   Elevated LFTs  - Mild elevation in transaminases and t bili noted on admission. Will trend  LFTs.  Debility, weakness.  Uses a cane at home.  We will get PT eval's  Family communication.  I spoke with the patient's spouse at bedside  No Charge  Signed,  Delila Pereyra, MD [facility]

## 2020-11-10 NOTE — ED Notes (Signed)
With Shirlee Limerick, NT, changed pt's sacral dressing. Performed pericare and applied barrier cream around reddened area.

## 2020-11-10 NOTE — ED Provider Notes (Signed)
I assumed care of this patient.  Please see previous provider note for further details of Hx, PE.  Briefly patient is a 75 y.o. male who presented fever. Known UTI with indwelling foley. Septic and given IV abx. Pending CT.  CT shows distended bladder with hydronephrosis and malpositioned foley catheter w/in the prostate.  Foley removed and new one placed. Will call for admission for IV abx.  .Critical Care Performed by: Fatima Blank, MD Authorized by: Fatima Blank, MD    CRITICAL CARE Performed by: Grayce Sessions Mechille Varghese Total critical care time: 30 minutes Critical care time was exclusive of separately billable procedures and treating other patients. Critical care was necessary to treat or prevent imminent or life-threatening deterioration. Critical care was time spent personally by me on the following activities: development of treatment plan with patient and/or surrogate as well as nursing, discussions with consultants, evaluation of patient's response to treatment, examination of patient, obtaining history from patient or surrogate, ordering and performing treatments and interventions, ordering and review of laboratory studies, ordering and review of radiographic studies, pulse oximetry and re-evaluation of patient's condition.     Fatima Blank, MD 11/10/20 [phone]

## 2020-11-11 ENCOUNTER — Encounter (HOSPITAL_COMMUNITY): Payer: Self-pay | Admitting: Internal Medicine

## 2020-11-11 DIAGNOSIS — A419 Sepsis, unspecified organism: Secondary | ICD-10-CM | POA: Diagnosis not present

## 2020-11-11 DIAGNOSIS — N390 Urinary tract infection, site not specified: Secondary | ICD-10-CM | POA: Diagnosis not present

## 2020-11-11 LAB — COMPREHENSIVE METABOLIC PANEL
ALT: 57 U/L — ABNORMAL HIGH (ref 0–44)
AST: 40 U/L (ref 15–41)
Albumin: 3.1 g/dL — ABNORMAL LOW (ref 3.5–5.0)
Alkaline Phosphatase: 73 U/L (ref 38–126)
Anion gap: 10 (ref 5–15)
BUN: 9 mg/dL (ref 8–23)
CO2: 25 mmol/L (ref 22–32)
Calcium: 9.3 mg/dL (ref 8.9–10.3)
Chloride: 103 mmol/L (ref 98–111)
Creatinine, Ser: 1.12 mg/dL (ref 0.61–1.24)
GFR, Estimated: >60 mL/min (ref >60)
Glucose, Bld: 130 mg/dL — ABNORMAL HIGH (ref 70–99)
Potassium: 4.3 mmol/L (ref 3.5–5.1)
Sodium: 138 mmol/L (ref 135–145)
Total Bilirubin: 1 mg/dL (ref 0.3–1.2)
Total Protein: 6.7 g/dL (ref 6.5–8.1)

## 2020-11-11 LAB — URINE CULTURE

## 2020-11-11 LAB — CBC
HCT: 42.4 % (ref 39.0–52.0)
Hemoglobin: 13.8 g/dL (ref 13.0–17.0)
MCH: 29.9 pg (ref 26.0–34.0)
MCHC: 32.5 g/dL (ref 30.0–36.0)
MCV: 92 fL (ref 80.0–100.0)
Platelets: 297 10*3/uL (ref 150–400)
RBC: 4.61 MIL/uL (ref 4.22–5.81)
RDW: 13.7 % (ref 11.5–15.5)
WBC: 6.7 10*3/uL (ref 4.0–10.5)
nRBC: 0 % (ref 0.0–0.2)

## 2020-11-11 LAB — GLUCOSE, CAPILLARY: Glucose-Capillary: 137 mg/dL — ABNORMAL HIGH (ref 70–99)

## 2020-11-11 LAB — CBG MONITORING, ED
Glucose-Capillary: 138 mg/dL — ABNORMAL HIGH (ref 70–99)
Glucose-Capillary: 167 mg/dL — ABNORMAL HIGH (ref 70–99)
Glucose-Capillary: 142 mg/dL — ABNORMAL HIGH (ref 70–99)

## 2020-11-11 LAB — PHOSPHORUS: Phosphorus: 3.2 mg/dL (ref 2.5–4.6)

## 2020-11-11 LAB — MAGNESIUM: Magnesium: 1.7 mg/dL (ref 1.7–2.4)

## 2020-11-11 MED ORDER — LIVING WELL WITH DIABETES BOOK
Freq: Once | Status: DC
  Filled 2020-11-11: qty 1

## 2020-11-11 MED ORDER — SODIUM CHLORIDE 0.9 % IV SOLN
INTRAVENOUS | Status: DC | PRN
  Administered 2020-11-11: 500 mL via INTRAVENOUS

## 2020-11-11 NOTE — Progress Notes (Signed)
Inpatient Diabetes Program Recommendations  AACE/ADA: New Consensus Statement on Inpatient Glycemic Control (2015)  Target Ranges:  Prepandial:   less than 140 mg/dL      Peak postprandial:   less than 180 mg/dL (1-2 hours)      Critically ill patients:  140 - 180 mg/dL   Lab Results  Component Value Date   GLUCAP 142 (H) 11/11/2020   HGBA1C 6.7 (H) 11/10/2020    Review of Glycemic Control  Diabetes history: DM2 Outpatient Diabetes medications: None Current orders for Inpatient glycemic control: Novolog 0-6 units tidwc and 0-5 units QHS HgbA1C - 6.7%  Inpatient Diabetes Program Recommendations:     Spoke with pt and wife regarding his diabetes and HgbA1C of 6.7%. Discussed healthy diet and f/u with PCP for diabetes management.  Will not need glucose meter at this point.  Sent Living Well with Diabetes book and pt will transfer up to floor.  Wife had many "diabetes" questions.  Will follow up for any further questions.   Thank you. Lorenda Peck, RD, LDN, CDE Inpatient Diabetes Coordinator [phone]

## 2020-11-11 NOTE — ED Notes (Signed)
Patient is on a stretcher that does not increase in height as it is broken. This RN attempted to pull patient up in bed with no assistance due to staffing. Patient's mepelex changed, patient has two stage 2 wounds on his sacral region. No new slough noted, but some irritation noted to area. Pateint has not had a BM. Catheter in place. Position of patient changed to decrease pressure on sacral region. Patient's skin damp with sweat from bed, fresh linen  Applied.

## 2020-11-11 NOTE — Progress Notes (Signed)
PT Cancellation Note  Patient Details Name: Robert [name] MRN: [id number] DOB: 07-06-1946   Cancelled Treatment:    Reason Eval/Treat Not Completed: Other (comment)patient eating. Wife present. Will check back another time.   Claretha Cooper 11/11/2020, 10:28 AM  [place] Pager [phone] Office [phone]

## 2020-11-11 NOTE — Progress Notes (Signed)
PROGRESS NOTE    Shields Pautz  WER:[id number] DOB: June 12, 1946 DOA: 11/09/2020 PCP: [name], MD     Brief Narrative:  Robert [name] is a 75 y.o. 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  Patient reports that he had some penile pain since his Foley catheter was last changed roughly 2 weeks ago.  He has been started on Bactrim for UTI and has since developed diarrhea.  He has also had worsening pain in his low back and abdomen and reports chills last night with shaking.  He had seen some blood and decreased urine in his Foley bag lately.  Denies any chest pain, cough, sore throat, or shortness of breath. Upon arrival to the ED, patient is found to be afebrile, saturating well on room air, tachypneic in the mid 20s, tachycardic to 120, and with stable blood pressure.  EKG features sinus tachycardia 326 and significant artifact.  Chest x-ray notable for streaky subsegmental atelectasis at the left base.  CT abdomen and pelvis is notable for moderate bilateral hydroureteronephrosis secondary to obstruction, malposition Foley catheter, and marked distention of the urinary bladder with surrounding fat stranding.  Chemistry panel features a potassium 5.2, creatinine 1.68, glucose 220, and mild elevation in transaminases and bilirubin.  CBC features a mild leukocytosis.  Lactic acid was 2.3 and then down to 1.9.  Blood and urine cultures were collected, Foley catheter was changed out, and the patient was given a liter of LR and cefepime.  C. difficile testing and GI pathogen panel were ordered.  COVID screening test was negative.  New events last 24 hours / Subjective: Wife is at bedside and provides most of  the history.  Apparently patient has a chronic indwelling Foley catheter that has been managed at home for about a year.  On January 5, home health nurse came and exchanges Foley catheter, since then, patient has had very little urine output and complained of abdominal bloating and pain.  On January 12, it was exchanged again without any improvement in his symptoms.  Upon arrival to the emergency department, Foley catheter was exchanged again, with rapid improvement in his symptoms.  Patient was also started on Bactrim 2 days ago for presumed UTI.  On examination this morning, patient denies any pain, does not offer any complaints.  Assessment & Plan:   Principal Problem:   Sepsis secondary to UTI [facility]) Active Problems:   UTI (urinary tract infection)   Urinary retention   History of CVA with residual deficit   History of deep vein thrombosis (DVT) of lower extremity   AKI (acute kidney injury) ([place])   Hyperkalemia   Parkinson disease (HCC)   Hyperglycemia   Elevated LFTs   Sepsis secondary to CAUTI, POA  -Presented with tachycardia, tachypnea -Patient was on Bactrim as outpatient for UTI -Continue IV cefepime -Blood cultures negative to date -Urine culture showing multiple species  AKI -Baseline creatinine 1.0 -Secondary to obstructive uropathy due to malpositioned catheter -Continue to hold losartan -Creatinine now back to baseline 1.12  Chronic urinary retention -Chronic Foley catheter use, recently exchanged 1/15 in the emergency department -Continue Flomax -Follows with [facility]  Parkinson's disease -Continue Sinemet, rasagiline, pimavanserin  History of recurrent lower extremity DVT -Continue Eliquis  Hypertension -Hold losartan due  to AKI  History of CVA with residual left-sided hemiparesis -Continue Eliquis, Zocor  Diarrhea -No further complaints  DM  -SSI   DVT prophylaxis:  apixaban (ELIQUIS) tablet 5 mg  Code Status: Full code Family  Communication: Wife at bedside Disposition Plan:  Status is: Inpatient  Remains inpatient appropriate because:Inpatient level of care appropriate due to severity of illness   Dispo: The patient is from: Home              Anticipated d/c is to: Home              Anticipated d/c date is: 1 day              Patient currently is not medically stable to d/c.  Continue IV antibiotics, continue to monitor for urine culture result   Antimicrobials:  Anti-infectives (From admission, onward)   Start     Dose/Rate Route Frequency Ordered Stop   11/10/20 2200  ceFEPIme (MAXIPIME) 2 g in sodium chloride 0.9 % 100 mL IVPB        2 g 200 mL/hr over 30 Minutes Intravenous Every 8 hours 11/10/20 1335     11/10/20 1000  ceFEPIme (MAXIPIME) 2 g in sodium chloride 0.9 % 100 mL IVPB  Status:  Discontinued        2 g 200 mL/hr over 30 Minutes Intravenous Every 12 hours 11/10/20 0247 11/10/20 1335   11/09/20 2315  cefTRIAXone (ROCEPHIN) 1 g in sodium chloride 0.9 % 100 mL IVPB  Status:  Discontinued        1 g 200 mL/hr over 30 Minutes Intravenous  Once 11/09/20 2301 11/09/20 2303   11/09/20 2315  ceFEPIme (MAXIPIME) 2 g in sodium chloride 0.9 % 100 mL IVPB        2 g 200 mL/hr over 30 Minutes Intravenous  Once 11/09/20 2309 11/10/20 0106        Objective: Vitals:   11/11/20 0130 11/11/20 0400 11/11/20 0600 11/11/20 1206  BP: (!) 169/73 (!) 148/77 (!) 146/76 123/81  Pulse: 73 77 80 66  Resp: 19 17 (!) 23 13  Temp:      TempSrc:      SpO2: 97% 99% 96% 96%  Weight:      Height:        Intake/Output Summary (Last 24 hours) at 11/11/2020 1238 Last data filed at 11/11/2020 0319 Gross per 24 hour  Intake 100 ml  Output --  Net 100 ml   Filed Weights   11/09/20 2135  Weight: 108.9 kg    Examination:  General exam: Appears calm and comfortable  Respiratory system: Clear to auscultation. Respiratory effort normal. No respiratory distress. No conversational dyspnea.  Cardiovascular system:  S1 & S2 heard, RRR. No murmurs. No pedal edema. Gastrointestinal system: Abdomen is nondistended, soft and nontender. Normal bowel sounds heard. Central nervous system: Alert. Extremities: Symmetric in appearance  Skin: No rashes, lesions or ulcers on exposed skin   Data Reviewed: I have personally reviewed following labs and imaging studies  CBC: Recent Labs  Lab 11/09/20 2139 11/10/20 0553 11/11/20 0500  WBC 10.7* 9.2 6.7  NEUTROABS 8.0*  --   --   HGB 15.6 13.0 13.8  HCT 48.4 39.6 42.4  MCV 92.0 89.8 92.0  PLT 242 282 [id number]   Basic Metabolic Panel: Recent Labs  Lab 11/09/20 2139 11/10/20 0553 11/11/20 0500  NA 134* 138 138  K 5.2* 3.9 4.3  CL 102 105 103  CO2 21* 24  25  GLUCOSE 220* 173* 130*  BUN 11 10 9   CREATININE 1.68* 1.31* 1.12  CALCIUM 9.3 9.1 9.3  MG  --   --  1.7  PHOS  --   --  3.2   GFR: Estimated Creatinine Clearance: 77.2 mL/min (by C-G formula based on SCr of 1.12 mg/dL). Liver Function Tests: Recent Labs  Lab 11/09/20 2139 11/10/20 0553 11/11/20 0500  AST 65* 44* 40  ALT 83* 48* 57*  ALKPHOS 107 88 73  BILITOT 1.3* 1.0 1.0  PROT 7.5 6.7 6.7  ALBUMIN 3.7 3.1* 3.1*   No results for input(s): LIPASE, AMYLASE in the last 168 hours. No results for input(s): AMMONIA in the last 168 hours. Coagulation Profile: Recent Labs  Lab 11/09/20 2139  INR 1.2   Cardiac Enzymes: No results for input(s): CKTOTAL, CKMB, CKMBINDEX, TROPONINI in the last 168 hours. BNP (last 3 results) No results for input(s): PROBNP in the last 8760 hours. HbA1C: Recent Labs    11/10/20 0553  HGBA1C 6.7*   CBG: Recent Labs  Lab 11/10/20 1250 11/10/20 1657 11/10/20 2132 11/11/20 0749 11/11/20 1154  GLUCAP 164* 137* 165* 138* 167*   Lipid Profile: No results for input(s): CHOL, HDL, LDLCALC, TRIG, CHOLHDL, LDLDIRECT in the last 72 hours. Thyroid Function Tests: No results for input(s): TSH, T4TOTAL, FREET4, T3FREE, THYROIDAB in the last 72 hours. Anemia  Panel: No results for input(s): VITAMINB12, FOLATE, FERRITIN, TIBC, IRON, RETICCTPCT in the last 72 hours. Sepsis Labs: Recent Labs  Lab 11/09/20 2139 11/09/20 2339  LATICACIDVEN 2.3* 1.9    Recent Results (from the past 240 hour(s))  Urine culture     Status: Abnormal   Collection Time: 11/09/20  9:39 PM   Specimen: In/Out Cath Urine  Result Value Ref Range Status   Specimen Description   Final    IN/OUT CATH URINE Performed at [facility], [place] [address] [place], [place] [address]    Special Requests   Final    NONE Performed at [facility], [place] [address], [place], [place] [address]    Culture MULTIPLE SPECIES PRESENT, SUGGEST RECOLLECTION (A)  Final   Report Status 11/11/2020 FINAL  Final  Blood Culture (routine x 2)     Status: None (Preliminary result)   Collection Time: 11/09/20  9:39 PM   Specimen: BLOOD  Result Value Ref Range Status   Specimen Description   Final    BLOOD RIGHT ANTECUBITAL Performed at [facility], [place] [address] [place], [place] [address]    Special Requests   Final    BOTTLES DRAWN AEROBIC AND ANAEROBIC Blood Culture adequate volume Performed at [facility] [address] [place], [place] [address]    Culture   Final    NO GROWTH 2 DAYS Performed at [place] [address] [place], [place] [address]    Report Status PENDING  Incomplete  Resp Panel by RT-PCR (Flu A&B, Covid) Nasopharyngeal Swab     Status: None   Collection Time: 11/09/20  9:41 PM   Specimen: Nasopharyngeal Swab; Nasopharyngeal(NP) swabs in vial transport medium  Result Value Ref Range Status   SARS Coronavirus 2 by RT PCR NEGATIVE NEGATIVE Final    Comment: (NOTE) SARS-CoV-2 target nucleic acids are NOT DETECTED.  The SARS-CoV-2 RNA is generally detectable in upper respiratory specimens during the acute phase of infection. The lowest concentration of SARS-CoV-2 viral copies this assay can  detect is 138 copies/mL. A  negative result does not preclude SARS-Cov-2 infection and should not be used as the sole basis for treatment or other patient management decisions. A negative result may occur with  improper specimen collection/handling, submission of specimen other than nasopharyngeal swab, presence of viral mutation(s) within the areas targeted by this assay, and inadequate number of viral copies(<138 copies/mL). A negative result must be combined with clinical observations, patient history, and epidemiological information. The expected result is Negative.  Fact Sheet for Patients:  EntrepreneurPulse.com.au  Fact Sheet for Healthcare Providers:  IncredibleEmployment.be  This test is no t yet approved or cleared by the [place] FDA and  has been authorized for detection and/or diagnosis of SARS-CoV-2 by FDA under an Emergency Use Authorization (EUA). This EUA will remain  in effect (meaning this test can be used) for the duration of the COVID-19 declaration under Section 564(b)(1) of the Act, 21 U.S.C.section 360bbb-3(b)(1), unless the authorization is terminated  or revoked sooner.       Influenza A by PCR NEGATIVE NEGATIVE Final   Influenza B by PCR NEGATIVE NEGATIVE Final    Comment: (NOTE) The Xpert Xpress SARS-CoV-2/FLU/RSV plus assay is intended as an aid in the diagnosis of influenza from Nasopharyngeal swab specimens and should not be used as a sole basis for treatment. Nasal washings and aspirates are unacceptable for Xpert Xpress SARS-CoV-2/FLU/RSV testing.  Fact Sheet for Patients: EntrepreneurPulse.com.au  Fact Sheet for Healthcare Providers: IncredibleEmployment.be  This test is not yet approved or cleared by the [place] FDA and has been authorized for detection and/or diagnosis of SARS-CoV-2 by FDA under an Emergency Use Authorization (EUA). This EUA will remain in  effect (meaning this test can be used) for the duration of the COVID-19 declaration under Section 564(b)(1) of the Act, 21 U.S.C. section 360bbb-3(b)(1), unless the authorization is terminated or revoked.  Performed at [facility], [place] [address] [place], [place] [address]   Blood Culture (routine x 2)     Status: None (Preliminary result)   Collection Time: 11/09/20  9:44 PM   Specimen: BLOOD RIGHT FOREARM  Result Value Ref Range Status   Specimen Description   Final    BLOOD RIGHT FOREARM Performed at [facility] [address] [place], [place] [address]    Special Requests   Final    BOTTLES DRAWN AEROBIC AND ANAEROBIC Blood Culture adequate volume Performed at [facility] [address] [place], [place] [address]    Culture   Final    NO GROWTH 2 DAYS Performed at [facility] [address] [place], [place] [address]    Report Status PENDING  Incomplete      Radiology Studies: DG Chest Port 1 View  Result Date: 11/09/2020 CLINICAL DATA:  Possible sepsis EXAM: PORTABLE CHEST 1 VIEW COMPARISON:  None. FINDINGS: The heart size and mediastinal contours are within normal limits. Streaky airspace opacity seen at the left lung base. The right lung is clear. No pleural effusion. The visualized skeletal structures are unremarkable. IMPRESSION: Streaky subsegmental atelectasis at the left lung base. Electronically Signed   By: Prudencio Pair M.D.   On: 11/09/2020 22:41   CT Renal Stone Study  Result Date: 11/09/2020 CLINICAL DATA:  Diarrhea. Being treated for urinary tract infection. EXAM: CT ABDOMEN AND PELVIS WITHOUT CONTRAST TECHNIQUE: Multidetector CT imaging of the abdomen and pelvis was performed following the standard protocol without IV contrast. COMPARISON:  May 08, 2017 FINDINGS: Lower chest: No acute abnormality.  Hepatobiliary: No focal liver abnormality is seen. Numerous gallstones are seen within the  gallbladder lumen. There is no evidence of gallbladder wall thickening, pericholecystic inflammatory fat stranding or biliary dilatation. Pancreas: Unremarkable. No pancreatic ductal dilatation or surrounding inflammatory changes. Spleen: Normal in size without focal abnormality. Adrenals/Urinary Tract: Adrenal glands are unremarkable. Kidneys are normal in size, without focal lesions. There is moderate severity bilateral hydronephrosis and hydroureter, without evidence of obstructing renal calculi. The urinary bladder is markedly distended. A mild amount of adjacent inflammatory fat stranding is noted. Large, stable right urinary bladder diverticula are seen. A Foley catheter is in place with its distal tip and insufflator bulb seen within the expected region of the prostate gland. Stomach/Bowel: Stomach is within normal limits. Appendix appears normal. No evidence of bowel dilatation. Noninflamed diverticula are seen throughout the sigmoid colon. Vascular/Lymphatic: Aortic atherosclerosis. An inferior vena cava filter is present. No enlarged abdominal or pelvic lymph nodes. Reproductive: The prostate gland is mildly enlarged. Other: No abdominal wall hernia or abnormality. No abdominopelvic ascites. Musculoskeletal: Multilevel degenerative changes seen throughout the lumbar spine. IMPRESSION: 1. Markedly distended urinary bladder with a mild amount of adjacent inflammatory fat stranding, consistent with cystitis. 2. Moderate severity bilateral hydronephrosis and hydroureter, without evidence of obstructing renal calculi. 3. Cholelithiasis without evidence of acute cholecystitis. 4. Sigmoid diverticulosis. 5. Malpositioned Foley catheter, as described above. 6. Aortic atherosclerosis. Aortic Atherosclerosis (ICD10-I70.0). Electronically Signed   By: Virgina Norfolk M.D.   On: 11/09/2020 23:53      Scheduled Meds: . apixaban  5 mg Oral BID  . busPIRone  7.5 mg Oral BID  . Carbidopa-Levodopa ER  1 tablet  Oral QHS  . Carbidopa-Levodopa ER  2 tablet Oral BID WC  . insulin aspart  0-5 Units Subcutaneous QHS  . insulin aspart  0-6 Units Subcutaneous TID WC  . living well with diabetes book   Does not apply Once  . Pimavanserin Tartrate  1 capsule Oral Daily  . rasagiline  1 mg Oral Daily  . simvastatin  20 mg Oral QPM  . tamsulosin  0.4 mg Oral Daily   Continuous Infusions: . ceFEPime (MAXIPIME) IV 2 g (11/11/20 0534)     LOS: 1 day      Time spent: 35 minutes   Dessa Phi, DO [facility] 11/11/2020, 12:38 PM   Available via Epic secure chat 7am-7pm After these hours, please refer to coverage provider listed on [email or web address]

## 2020-11-12 DIAGNOSIS — N390 Urinary tract infection, site not specified: Secondary | ICD-10-CM | POA: Diagnosis not present

## 2020-11-12 DIAGNOSIS — A419 Sepsis, unspecified organism: Secondary | ICD-10-CM | POA: Diagnosis not present

## 2020-11-12 LAB — BASIC METABOLIC PANEL
Anion gap: 11 (ref 5–15)
BUN: 13 mg/dL (ref 8–23)
CO2: 25 mmol/L (ref 22–32)
Calcium: 9.1 mg/dL (ref 8.9–10.3)
Chloride: 102 mmol/L (ref 98–111)
Creatinine, Ser: 1.12 mg/dL (ref 0.61–1.24)
GFR, Estimated: >60 mL/min (ref >60)
Glucose, Bld: 157 mg/dL — ABNORMAL HIGH (ref 70–99)
Potassium: 3.9 mmol/L (ref 3.5–5.1)
Sodium: 138 mmol/L (ref 135–145)

## 2020-11-12 LAB — GLUCOSE, CAPILLARY
Glucose-Capillary: 128 mg/dL — ABNORMAL HIGH (ref 70–99)
Glucose-Capillary: 171 mg/dL — ABNORMAL HIGH (ref 70–99)

## 2020-11-12 MED ORDER — CEPHALEXIN 500 MG PO CAPS: 500.0000 mg | ORAL_CAPSULE | Freq: Four times a day (QID) | ORAL | 0 refills | Status: AC

## 2020-11-12 NOTE — Plan of Care (Signed)
  Problem: Pain Managment: Goal: General experience of comfort will improve Outcome: Progressing   

## 2020-11-12 NOTE — Evaluation (Signed)
Physical Therapy Evaluation Patient Details Name: Robert [name] MRN: [id number] DOB: Apr 22, 1946 Today's Date: 11/12/2020   History of Present Illness  75 yo male admitted with sepsis 2* UTI. Hx of dementia, CVA with L residual weakness/drop foot, DVT, IVC filter, hearing loss, Parkinson's.  Clinical Impression  On eval, pt required Mod assist +2 for bed mobility and Min assist to pivot to recliner using hemiwalker. Pt tolerated activity well. Wife was present and assisted with mobility. Discussed d/c plan-pt will return home where he lives with his wife. She prefers to resume OP PT once able. Plan is for potential d/c home later today.      Follow Up Recommendations Supervision/Assistance - 24 hour (wife wants to resume OP PT)    Equipment Recommendations   (PTAR transport home)    Recommendations for Other Services       Precautions / Restrictions Precautions Precautions: Fall Restrictions Weight Bearing Restrictions: No      Mobility  Bed Mobility Overal bed mobility: Needs Assistance Bed Mobility: Supine to Sit     Supine to sit: Mod assist;+2 for physical assistance;+2 for safety/equipment;HOB elevated     General bed mobility comments: Assist for LEs and trunk. Increased time. Cues for pt to participate with using R side. Had wife assist since she will have to do it at home    Transfers Overall transfer level: Needs assistance Equipment used: Hemi-walker Transfers: Sit to/from [facility] Sit to Stand: Min assist;From elevated surface Stand pivot transfers: Min assist       General transfer comment: Assist to rise, stabilize, control descent. Increased time. Highly elevated bed surface. Stand pivot, bed to recliner, using hemi walker.  Ambulation/Gait                Stairs            Wheelchair Mobility    Modified Rankin (Stroke Patients Only)       Balance Overall balance assessment: Needs assistance          Standing balance support: Single extremity supported Standing balance-Leahy Scale: Poor                               Pertinent Vitals/Pain Pain Assessment: Faces Faces Pain Scale: No hurt    Home Living Family/patient expects to be discharged to:: Private residence Living Arrangements: Spouse/significant other Available Help at Discharge: Family;Available 24 hours/day Type of Home: Apartment Home Access: Level entry     Home Layout: One level Home Equipment: Transport chair;Hospital bed;Shower seat      Prior Function Level of Independence: Needs assistance   Gait / Transfers Assistance Needed: wife assists pt into transport chair. non-ambulatory. going to outpatient PT 2x/week  ADL's / Homemaking Assistance Needed: wife assists with ADLs and IADLs        Hand Dominance        Extremity/Trunk Assessment   Upper Extremity Assessment Upper Extremity Assessment: LUE deficits/detail LUE Deficits / Details: increased tone-arm positioned against torso. No functional use. LUE Coordination: decreased gross motor;decreased fine motor    Lower Extremity Assessment Lower Extremity Assessment: LLE deficits/detail LLE Deficits / Details: increased tone. able to weightbear with AFO LLE Coordination: decreased gross motor;decreased fine motor       Communication   Communication: No difficulties  Cognition Arousal/Alertness: Awake/alert Behavior During Therapy: WFL for tasks assessed/performed Overall Cognitive Status: History of cognitive impairments - at baseline  General Comments      Exercises     Assessment/Plan    PT Assessment Patient needs continued PT services  PT Problem List Decreased strength;Decreased mobility;Decreased range of motion;Decreased balance;Decreased activity tolerance;Decreased cognition;Decreased safety awareness       PT Treatment Interventions DME  instruction;Therapeutic activities;Therapeutic exercise;Patient/family education;Balance training;Functional mobility training    PT Goals (Current goals can be found in the Care Plan section)  Acute Rehab PT Goals Patient Stated Goal: home PT Goal Formulation: With patient/family Time For Goal Achievement: 11/26/20 Potential to Achieve Goals: Fair    Frequency Min 3X/week   Barriers to discharge        Co-evaluation               AM-PAC PT "6 Clicks" Mobility  Outcome Measure Help needed turning from your back to your side while in a flat bed without using bedrails?: A Lot Help needed moving from lying on your back to sitting on the side of a flat bed without using bedrails?: A Lot Help needed moving to and from a bed to a chair (including a wheelchair)?: A Little Help needed standing up from a chair using your arms (e.g., wheelchair or bedside chair)?: A Little Help needed to walk in hospital room?: A Lot Help needed climbing 3-5 steps with a railing? : Total 6 Click Score: 13    End of Session Equipment Utilized During Treatment: Gait belt Activity Tolerance: Patient tolerated treatment well Patient left: in chair;with call bell/phone within reach;with family/visitor present   PT Visit Diagnosis: Muscle weakness (generalized) (M62.81);Hemiplegia and hemiparesis Hemiplegia - Right/Left: Left Hemiplegia - caused by: Cerebral infarction    Time: 4193-7902 PT Time Calculation (min) (ACUTE ONLY): 25 min   Charges:   PT Evaluation $PT Eval Moderate Complexity: 1 Mod PT Treatments $Gait Training: 8-22 mins           Doreatha Massed, PT Acute Rehabilitation  Office: [phone] Pager: [phone]

## 2020-11-12 NOTE — Plan of Care (Signed)
Poc discussed with pt and caregiver

## 2020-11-12 NOTE — Discharge Summary (Addendum)
Physician Discharge Summary  Robert [name] [id number] DOB: 08/03/1946 DOA: 11/09/2020  PCP: [name], MD  Admit date: 11/09/2020 Discharge date: 11/12/2020  Admitted From: Home Disposition:  Home   Recommendations for Outpatient Follow-up:  1. Follow up with PCP in 1 week  Discharge Condition: Stable CODE STATUS: Full  Diet recommendation:  Diet Orders (From admission, onward)    Start     Ordered   11/10/20 0120  Diet Heart Room service appropriate? Yes; Fluid consistency: Thin  Diet effective now       Question Answer Comment  Room service appropriate? 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 Patient reports that he had some penile pain since his Foley catheter was last changed roughly 2 weeks ago. He has been started on Bactrim for UTI and has since developed diarrhea. He has also had worsening pain in his low back and abdomen and reports chills last night with shaking. He had seen some blood and decreased urine in his Foley bag lately. Denies any chest pain, cough, sore throat, or shortness of breath. Upon arrival to the ED, patient is found to be afebrile, saturating well on room air, tachypneic in the mid 20s, tachycardic to 120, and with stable blood pressure. EKG features sinus tachycardia 326 and significant artifact. Chest x-ray notable for streaky subsegmental atelectasis at the left base. CT abdomen and pelvis is notable for moderate bilateral hydroureteronephrosis secondary to obstruction, malposition Foley catheter, and marked distention of the urinary  bladder with surrounding fat stranding. Chemistry panel features a potassium 5.2, creatinine 1.68, glucose 220, and mild elevation in transaminases and bilirubin. CBC features a mild leukocytosis. Lactic acid was 2.3 and then down to 1.9. Blood and urine cultures were collected, Foley catheter was changed out, and the patient was given a liter of LR and cefepime. COVID screening test was negative.  Patient's Foley catheter was exchanged with rapid improvement in symptoms.  Patient was treated with IV cefepime, IV fluid.  AKI resolved.  On day of discharge, patient was in good spirits, had no physical complaints.  He will do physical therapy and was discharged home in improved condition.  Discharge Diagnoses:  Principal Problem:   Sepsis secondary to UTI [facility]) Active Problems:   UTI (urinary tract infection)   Urinary retention   History of CVA with residual deficit   History of deep vein thrombosis (DVT) of lower extremity   AKI (acute kidney injury) ([place])   Hyperkalemia   Parkinson disease (HCC)   Hyperglycemia   Elevated LFTs   Severe sepsis secondary to CAUTI, POA  -Presented with tachycardia, tachypnea, lactic 2.3 -Patient was on Bactrim as outpatient for UTI -IV cefepime --> keflex  -Blood cultures negative to date -Urine culture showing multiple species  AKI -Baseline creatinine 1.0 -Secondary to obstructive uropathy due to malpositioned catheter -Creatinine now back to baseline 1.12 -Resume losartan   Chronic urinary retention -Chronic Foley catheter use, recently exchanged 1/15 in the emergency department -Continue Flomax -Follows with [facility]  Parkinson's disease -Continue Sinemet, rasagiline, pimavanserin  History of recurrent lower extremity DVT -Continue Eliquis  Hypertension -Resume losartan  History of CVA with residual left-sided hemiparesis -Continue Eliquis, Zocor  Diarrhea -No further complaints  DM  -Hemoglobin A1c  6.7   Discharge Instructions  Discharge Instructions    Call MD for:  difficulty breathing, headache or visual disturbances   Complete by: As directed    Call MD for:  extreme fatigue   Complete by: As directed    Call MD for:  persistant dizziness or light-headedness   Complete by: As directed    Call MD for:  persistant nausea and vomiting   Complete by: As directed    Call MD for:  severe uncontrolled pain   Complete by: As directed    Call MD for:  temperature >100.4   Complete by: As directed    Discharge instructions   Complete by: As directed    You were cared for by a hospitalist during your hospital stay. If you have any questions about your discharge medications or the care you received while you were in the hospital after you are discharged, you can call the unit and ask to speak with the hospitalist on call if the hospitalist that took care of you is not available. Once you are discharged, your primary care physician will handle any further medical issues. Please note that NO REFILLS for any discharge medications will be authorized once you are discharged, as it is imperative that you return to your primary care physician (or establish a relationship with a primary care physician if you do not have one) for your aftercare needs so that they can reassess your need for medications and monitor your lab values.   Increase activity slowly   Complete by: As directed      Allergies as of 11/12/2020      Reactions   Propofol Other (See Comments)   "hiccups for weeks"      Medication List    STOP taking these medications   ALPRAZolam 0.5 MG tablet Commonly known as: XANAX   bethanechol 10 MG tablet Commonly known as: URECHOLINE   finasteride 5 MG tablet Commonly known as: PROSCAR   HYDROcodone-acetaminophen 5-325 MG tablet Commonly known as: NORCO/VICODIN     TAKE these medications   acetaminophen 500 MG tablet Commonly known as: TYLENOL Take 1,000 mg by mouth  every 6 (six) hours as needed (shoulder pain).   apixaban 5 MG Tabs tablet Commonly known as: ELIQUIS Take 1 tablet (5 mg total) by mouth 2 (two) times daily. What changed: Another medication with the same name was removed. Continue taking this medication, and follow the directions you see here.   busPIRone 7.5 MG tablet Commonly known as: BUSPAR Take 7.5 mg by mouth 2 (two) times daily. 1 and 1/2 tablet(s)   Carbidopa-Levodopa ER 25-100 MG tablet controlled release Commonly known as: SINEMET CR TAKE TWO TABLETS BY MOUTH THREE TIMES DAILY What changed:   when to take this  additional instructions   cephALEXin 500 MG capsule Commonly known as: KEFLEX Take 1 capsule (500 mg total) by mouth 4 (four) times daily for 5 days.   lidocaine 5 % Commonly known as: Lidoderm Place 1 patch onto the skin daily. Remove & Discard patch within 12 hours or as directed by MD   losartan 50 MG tablet Commonly known as: COZAAR Take 50 mg by mouth daily.   Nuplazid 34 MG Caps Generic drug: Pimavanserin Tartrate Take 1 capsule by mouth daily.   polyethylene glycol 17 g packet Commonly known as: MiraLax Take 17 g by mouth  daily. What changed:   when to take this  reasons to take this   rasagiline 0.5 MG Tabs tablet Commonly known as: AZILECT Take 1 mg by mouth daily.   simvastatin 20 MG tablet Commonly known as: ZOCOR Take 20 mg by mouth every evening.   tamsulosin 0.4 MG Caps capsule Commonly known as: FLOMAX Take 0.4 mg by mouth daily.            Durable Medical Equipment  (From admission, onward)         Start     Ordered   11/12/20 0924  For home use only DME 3 n 1  Once        11/12/20 5643          Follow-up Information    [name], MD. Schedule an appointment as soon as possible for a visit in 1 week(s).   Specialty: Family Medicine Contact information: [place] [address] [phone]        [place] Follow up.   Contact information: [place] [phone]             Allergies  Allergen Reactions  . Propofol Other (See Comments)    "hiccups for weeks"    Consultations:  None    Procedures/Studies: DG Chest Port 1 View  Result Date: 11/09/2020 CLINICAL DATA:  Possible sepsis EXAM: PORTABLE CHEST 1 VIEW COMPARISON:  None. FINDINGS: The heart size and mediastinal contours are within normal limits. Streaky airspace opacity seen at the left lung base. The right lung is clear. No pleural effusion. The visualized skeletal structures are unremarkable. IMPRESSION: Streaky subsegmental atelectasis at the left lung base. Electronically Signed   By: Prudencio Pair M.D.   On: 11/09/2020 22:41   CT Renal Stone Study  Result Date: 11/09/2020 CLINICAL DATA:  Diarrhea. Being treated for urinary tract infection. EXAM: CT ABDOMEN AND PELVIS WITHOUT CONTRAST TECHNIQUE: Multidetector CT imaging of the abdomen and pelvis was performed following the standard protocol without IV contrast. COMPARISON:  May 08, 2017 FINDINGS: Lower chest: No acute abnormality. Hepatobiliary: No focal liver abnormality is seen. Numerous gallstones are seen within the gallbladder lumen. There is no evidence of gallbladder wall thickening, pericholecystic inflammatory fat stranding or biliary dilatation. Pancreas: Unremarkable. No pancreatic ductal dilatation or surrounding inflammatory changes. Spleen: Normal in size without focal abnormality. Adrenals/Urinary Tract: Adrenal glands are unremarkable. Kidneys are normal in size, without focal lesions. There is moderate severity bilateral hydronephrosis and hydroureter, without evidence of obstructing renal calculi. The urinary bladder is markedly distended. A mild amount of adjacent inflammatory fat stranding is noted. Large, stable right urinary bladder diverticula are seen. A Foley catheter is in place with its distal tip  and insufflator bulb seen within the expected region of the prostate gland. Stomach/Bowel: Stomach is within normal limits. Appendix appears normal. No evidence of bowel dilatation. Noninflamed diverticula are seen throughout the sigmoid colon. Vascular/Lymphatic: Aortic atherosclerosis. An inferior vena cava filter is present. No enlarged abdominal or pelvic lymph nodes. Reproductive: The prostate gland is mildly enlarged. Other: No abdominal wall hernia or abnormality. No abdominopelvic ascites. Musculoskeletal: Multilevel degenerative changes seen throughout the lumbar spine. IMPRESSION: 1. Markedly distended urinary bladder with a mild amount of adjacent inflammatory fat stranding, consistent with cystitis. 2. Moderate severity bilateral hydronephrosis and hydroureter, without evidence of obstructing renal calculi. 3. Cholelithiasis without evidence of acute cholecystitis. 4. Sigmoid diverticulosis. 5. Malpositioned Foley  catheter, as described above. 6. Aortic atherosclerosis. Aortic Atherosclerosis (ICD10-I70.0). Electronically Signed   By: Virgina Norfolk M.D.   On: 11/09/2020 23:53      Discharge Exam: Vitals:   11/12/20 0507 11/12/20 1026  BP: 132/82 135/79  Pulse: 70 82  Resp: 16 17  Temp: 98.1 F (36.7 C) 98.9 F (37.2 C)  SpO2: 96% 96%    General: Pt is alert, awake, not in acute distress Cardiovascular: RRR, S1/S2 +, no edema Respiratory: CTA bilaterally, no wheezing, no rhonchi, no respiratory distress, no conversational dyspnea  Abdominal: Soft, NT, ND, bowel sounds + Extremities: no edema, no cyanosis Psych: Normal mood and affect, stable judgement and insight     The results of significant diagnostics from this hospitalization (including imaging, microbiology, ancillary and laboratory) are listed below for reference.     Microbiology: Recent Results (from the past 240 hour(s))  Urine culture     Status: Abnormal   Collection Time: 11/09/20  9:39 PM   Specimen:  In/Out Cath Urine  Result Value Ref Range Status   Specimen Description   Final    IN/OUT CATH URINE Performed at [facility] [address], [place], [place] [address]    Special Requests   Final    NONE Performed at [facility], [place] [address], [place], [name] [address]    Culture MULTIPLE SPECIES PRESENT, SUGGEST RECOLLECTION (A)  Final   Report Status 11/11/2020 FINAL  Final  Blood Culture (routine x 2)     Status: None (Preliminary result)   Collection Time: 11/09/20  9:39 PM   Specimen: BLOOD  Result Value Ref Range Status   Specimen Description   Final    BLOOD RIGHT ANTECUBITAL Performed at [facility], [place] [address], [place], [place] [address]    Special Requests   Final    BOTTLES DRAWN AEROBIC AND ANAEROBIC Blood Culture adequate volume Performed at [place] [address], [place], [place] [address]    Culture   Final    NO GROWTH 3 DAYS Performed at [facility], [place] [address], [place], [place] [address]    Report Status PENDING  Incomplete  Resp Panel by RT-PCR (Flu A&B, Covid) Nasopharyngeal Swab     Status: None   Collection Time: 11/09/20  9:41 PM   Specimen: Nasopharyngeal Swab; Nasopharyngeal(NP) swabs in vial transport medium  Result Value Ref Range Status   SARS Coronavirus 2 by RT PCR NEGATIVE NEGATIVE Final    Comment: (NOTE) SARS-CoV-2 target nucleic acids are NOT DETECTED.  The SARS-CoV-2 RNA is generally detectable in upper respiratory specimens during the acute phase of infection. The lowest concentration of SARS-CoV-2 viral copies this assay can detect is 138 copies/mL. A negative result does not preclude SARS-Cov-2 infection and should not be used as the sole basis for treatment or other patient management decisions. A negative result may occur with  improper specimen collection/handling, submission of specimen other than nasopharyngeal swab, presence of viral  mutation(s) within the areas targeted by this assay, and inadequate number of viral copies(<138 copies/mL). A negative result must be combined with clinical observations, patient history, and epidemiological information. The expected result is Negative.  Fact Sheet for Patients:  EntrepreneurPulse.com.au  Fact Sheet for Healthcare Providers:  IncredibleEmployment.be  This test is no t yet approved or cleared by the [place] FDA and  has been authorized for detection and/or diagnosis of SARS-CoV-2 by FDA under an Emergency Use  Authorization (EUA). This EUA will remain  in effect (meaning this test can be used) for the duration of the COVID-19 declaration under Section 564(b)(1) of the Act, 21 U.S.C.section 360bbb-3(b)(1), unless the authorization is terminated  or revoked sooner.       Influenza A by PCR NEGATIVE NEGATIVE Final   Influenza B by PCR NEGATIVE NEGATIVE Final    Comment: (NOTE) The Xpert Xpress SARS-CoV-2/FLU/RSV plus assay is intended as an aid in the diagnosis of influenza from Nasopharyngeal swab specimens and should not be used as a sole basis for treatment. Nasal washings and aspirates are unacceptable for Xpert Xpress SARS-CoV-2/FLU/RSV testing.  Fact Sheet for Patients: EntrepreneurPulse.com.au  Fact Sheet for Healthcare Providers: IncredibleEmployment.be  This test is not yet approved or cleared by the [place] FDA and has been authorized for detection and/or diagnosis of SARS-CoV-2 by FDA under an Emergency Use Authorization (EUA). This EUA will remain in effect (meaning this test can be used) for the duration of the COVID-19 declaration under Section 564(b)(1) of the Act, 21 U.S.C. section 360bbb-3(b)(1), unless the authorization is terminated or revoked.  Performed at [facility], [place] [address] [place], [place] [address]   Blood Culture (routine  x 2)     Status: None (Preliminary result)   Collection Time: 11/09/20  9:44 PM   Specimen: BLOOD RIGHT FOREARM  Result Value Ref Range Status   Specimen Description   Final    BLOOD RIGHT FOREARM Performed at [facility] [address] [place], [place] [address]    Special Requests   Final    BOTTLES DRAWN AEROBIC AND ANAEROBIC Blood Culture adequate volume Performed at [facility] [address] [place], [place] [address]    Culture   Final    NO GROWTH 3 DAYS Performed at [facility], [place] [address], [place], [place] [address]    Report Status PENDING  Incomplete     Labs: BNP (last 3 results) No results for input(s): BNP in the last 8760 hours. Basic Metabolic Panel: Recent Labs  Lab 11/09/20 2139 11/10/20 0553 11/11/20 0500 11/12/20 0335  NA 134* 138 138 138  K 5.2* 3.9 4.3 3.9  CL 102 105 103 102  CO2 21* 24 25 25   GLUCOSE 220* 173* 130* 157*  BUN 11 10 9 13   CREATININE 1.68* 1.31* 1.12 1.12  CALCIUM 9.3 9.1 9.3 9.1  MG  --   --  1.7  --   PHOS  --   --  3.2  --    Liver Function Tests: Recent Labs  Lab 11/09/20 2139 11/10/20 0553 11/11/20 0500  AST 65* 44* 40  ALT 83* 48* 57*  ALKPHOS 107 88 73  BILITOT 1.3* 1.0 1.0  PROT 7.5 6.7 6.7  ALBUMIN 3.7 3.1* 3.1*   No results for input(s): LIPASE, AMYLASE in the last 168 hours. No results for input(s): AMMONIA in the last 168 hours. CBC: Recent Labs  Lab 11/09/20 2139 11/10/20 0553 11/11/20 0500  WBC 10.7* 9.2 6.7  NEUTROABS 8.0*  --   --   HGB 15.6 13.0 13.8  HCT 48.4 39.6 42.4  MCV 92.0 89.8 92.0  PLT 242 282 297   Cardiac Enzymes: No results for input(s): CKTOTAL, CKMB, CKMBINDEX, TROPONINI in the last 168 hours. BNP: Invalid input(s): POCBNP CBG: Recent Labs  Lab 11/11/20 1154 11/11/20 1705 11/11/20 2147 11/12/20 0857 11/12/20 1236  GLUCAP 167* 142* 137* 128* 171*   D-Dimer No  results for input(s): DDIMER in the last 72  hours. Hgb A1c Recent Labs    11/10/20 0553  HGBA1C 6.7*   Lipid Profile No results for input(s): CHOL, HDL, LDLCALC, TRIG, CHOLHDL, LDLDIRECT in the last 72 hours. Thyroid function studies No results for input(s): TSH, T4TOTAL, T3FREE, THYROIDAB in the last 72 hours.  Invalid input(s): FREET3 Anemia work up No results for input(s): VITAMINB12, FOLATE, FERRITIN, TIBC, IRON, RETICCTPCT in the last 72 hours. Urinalysis    Component Value Date/Time   COLORURINE YELLOW 11/09/2020 2139   APPEARANCEUR TURBID (A) 11/09/2020 2139   LABSPEC 1.013 11/09/2020 2139   PHURINE 5.0 11/09/2020 2139   GLUCOSEU 50 (A) 11/09/2020 2139   GLUCOSEU 100 (A) 02/06/2019 0813   HGBUR LARGE (A) 11/09/2020 2139   BILIRUBINUR NEGATIVE 11/09/2020 2139   [place] 11/09/2020 2139   PROTEINUR 100 (A) 11/09/2020 2139   UROBILINOGEN 1.0 02/06/2019 0813   NITRITE POSITIVE (A) 11/09/2020 2139   LEUKOCYTESUR LARGE (A) 11/09/2020 2139   Sepsis Labs Invalid input(s): PROCALCITONIN,  WBC,  LACTICIDVEN Microbiology Recent Results (from the past 240 hour(s))  Urine culture     Status: Abnormal   Collection Time: 11/09/20  9:39 PM   Specimen: In/Out Cath Urine  Result Value Ref Range Status   Specimen Description   Final    IN/OUT CATH URINE Performed at [facility], [place] [address], [place], [place] [address]    Special Requests   Final    NONE Performed at [facility], [place] [address], [place], [place] [address]    Culture MULTIPLE SPECIES PRESENT, SUGGEST RECOLLECTION (A)  Final   Report Status 11/11/2020 FINAL  Final  Blood Culture (routine x 2)     Status: None (Preliminary result)   Collection Time: 11/09/20  9:39 PM   Specimen: BLOOD  Result Value Ref Range Status   Specimen Description   Final    BLOOD RIGHT ANTECUBITAL Performed at [facility], [place] [address], [place], [place] [address]    Special Requests   Final    BOTTLES DRAWN AEROBIC  AND ANAEROBIC Blood Culture adequate volume Performed at [facility] [address], [place], [place] [address]    Culture   Final    NO GROWTH 3 DAYS Performed at [facility], [place] [address] [place], [place] [address]    Report Status PENDING  Incomplete  Resp Panel by RT-PCR (Flu A&B, Covid) Nasopharyngeal Swab     Status: None   Collection Time: 11/09/20  9:41 PM   Specimen: Nasopharyngeal Swab; Nasopharyngeal(NP) swabs in vial transport medium  Result Value Ref Range Status   SARS Coronavirus 2 by RT PCR NEGATIVE NEGATIVE Final    Comment: (NOTE) SARS-CoV-2 target nucleic acids are NOT DETECTED.  The SARS-CoV-2 RNA is generally detectable in upper respiratory specimens during the acute phase of infection. The lowest concentration of SARS-CoV-2 viral copies this assay can detect is 138 copies/mL. A negative result does not preclude SARS-Cov-2 infection and should not be used as the sole basis for treatment or other patient management decisions. A negative result may occur with  improper specimen collection/handling, submission of specimen other than nasopharyngeal swab, presence of viral mutation(s) within the areas targeted by this assay, and inadequate number of viral copies(<138 copies/mL). A negative result must be combined with clinical observations, patient history, and epidemiological information. The expected result is Negative.  Fact Sheet for Patients:  EntrepreneurPulse.com.au  Fact Sheet  for Healthcare Providers:  IncredibleEmployment.be  This test is no t yet approved or cleared by the [place] and  has been authorized for detection and/or diagnosis of SARS-CoV-2 by FDA under an Emergency Use Authorization (EUA). This EUA will remain  in effect (meaning this test can be used) for the duration of the COVID-19 declaration under Section 564(b)(1) of the Act, 21 U.S.C.section 360bbb-3(b)(1),  unless the authorization is terminated  or revoked sooner.       Influenza A by PCR NEGATIVE NEGATIVE Final   Influenza B by PCR NEGATIVE NEGATIVE Final    Comment: (NOTE) The Xpert Xpress SARS-CoV-2/FLU/RSV plus assay is intended as an aid in the diagnosis of influenza from Nasopharyngeal swab specimens and should not be used as a sole basis for treatment. Nasal washings and aspirates are unacceptable for Xpert Xpress SARS-CoV-2/FLU/RSV testing.  Fact Sheet for Patients: EntrepreneurPulse.com.au  Fact Sheet for Healthcare Providers: IncredibleEmployment.be  This test is not yet approved or cleared by the [place] FDA and has been authorized for detection and/or diagnosis of SARS-CoV-2 by FDA under an Emergency Use Authorization (EUA). This EUA will remain in effect (meaning this test can be used) for the duration of the COVID-19 declaration under Section 564(b)(1) of the Act, 21 U.S.C. section 360bbb-3(b)(1), unless the authorization is terminated or revoked.  Performed at [facility], [place] [address] [place], [place] [address]   Blood Culture (routine x 2)     Status: None (Preliminary result)   Collection Time: 11/09/20  9:44 PM   Specimen: BLOOD RIGHT FOREARM  Result Value Ref Range Status   Specimen Description   Final    BLOOD RIGHT FOREARM Performed at [facility] [address] [place], [place] [address]    Special Requests   Final    BOTTLES DRAWN AEROBIC AND ANAEROBIC Blood Culture adequate volume Performed at [facility] [address], [place], [place] [address]    Culture   Final    NO GROWTH 3 DAYS Performed at [facility], [place] [address], [place], [place] [address]    Report Status PENDING  Incomplete     Patient was seen and examined on the day of discharge and was found to be in stable condition. Time coordinating discharge: 35 minutes  including assessment and coordination of care, as well as examination of the patient.   SIGNED:  Dessa Phi, DO [facility] 11/12/2020, 12:54 PM

## 2020-11-12 NOTE — TOC Initial Note (Signed)
Transition of Care [facility]) - Initial/Assessment Note    Patient Details  Name: Robert [name] MRN: [id number] Date of Birth: [birth year]  Transition of Care [facility]) CM/SW Contact:    Lia Hopping, [place] Phone Number: 11/12/2020, 3:04 PM  Clinical Narrative: Patient admitted for Diarrhea, shaking chills, pain in back, abdomen, and penis.  Re: Home, H.H and DME                 CSW met with the patient spouse at bedside discuss the patient plan to transition back home. Spouse reports she provides 24/7 care to the patient. The patient was active with [facility] for RN/PT, the spouse has requested not to use their services again. CSW offered the spouse home health choice that accept [facility] contract. Spouse chose to use [facility] ([facility]). CSW made a referral. Spouse requested DME-3 IN1. CSW ordered through [employer] and delivered to the patient bedside. Spouse requested to use wound care supplies (Mepilex sacral foam) to be ordered through the [place].  CSW reached out to the [place] social worker Abundio Miu and notified her of the spouse supply request and Home Health authorization for PT/RN services. CSW faxed orders to Dr. Tarry Kos [facility] office [phone]).  Spouse inquired about Foley catheter name and size, nurse educated the spouse.   PTAR arranged to transport the patient home.  No other needs identified.   Expected Discharge Plan: [place] Barriers to Discharge: Barriers Resolved   Patient Goals and CMS Choice   CMS Medicare.gov Compare Post Acute Care list provided to:: Other (Comment Required)    Expected Discharge Plan and Services Expected Discharge Plan: [place] In-house Referral: Clinical Social Work   Post Acute Care Choice: [place] arrangements for the past 2 months: [place] Expected Discharge Date: 11/12/20                         HH Arranged: RN,PT [place] Agency: [place]  ([facility]) Date [place]: 11/12/20 Time [place]: 1142 Representative spoke with at [place]: [place] Arrangements/Services Living arrangements for the past 2 months: [place] Lives with:: Self Patient language and need for interpreter reviewed:: No Do you feel safe going back to the place where you live?: Yes      Need for Family Participation in Patient Care: Yes (Comment) Care giver support system in place?: Yes (comment) Current home services: DME Criminal Activity/Legal Involvement Pertinent to Current Situation/Hospitalization: No - Comment as needed  Activities of Daily Living Home Assistive Devices/Equipment: Cane (specify quad or straight),Walker (specify type),Grab bars in shower,Wheelchair,Other (Comment) (foley catheter, manual wheelchair, tub/shower unit, single point cane, standard toilet, tub bench) ADL Screening (condition at time of admission) Patient's cognitive ability adequate to safely complete daily activities?: Yes Is the patient deaf or have difficulty hearing?: No Does the patient have difficulty seeing, even when wearing glasses/contacts?: No Does the patient have difficulty concentrating, remembering, or making decisions?: Yes Patient able to express need for assistance with ADLs?: Yes Does the patient have difficulty dressing or bathing?: Yes Independently performs ADLs?: No Communication: Independent Dressing (OT): Needs assistance Is this a change from baseline?: Pre-admission baseline Grooming: Independent Feeding: Needs assistance Is this a change from baseline?: Pre-admission baseline Bathing: Needs assistance Is this a change from baseline?: Pre-admission baseline Toileting: Needs assistance Is this a change from baseline?: Pre-admission baseline In/Out Bed: Needs assistance  Is this a change from baseline?: Pre-admission baseline Walks in Home: Dependent (patient non ambulatory) Is this a change  from baseline?: Pre-admission baseline Does the patient have difficulty walking or climbing stairs?: Yes (secondary to parkinson's) Weakness of Legs: Both (L>R) Weakness of Arms/Hands: Both (L>R)  Permission Sought/Granted Permission sought to share information with : Case Manager                Emotional Assessment Appearance:: Appears stated age     Orientation: : Oriented to Self,Oriented to Place Alcohol / Substance Use: Not Applicable Psych Involvement: No (comment)  Admission diagnosis:  UTI (urinary tract infection) [N39.0] SIRS (systemic inflammatory response syndrome) (HCC) [R65.10] AKI (acute kidney injury) ([place]) [N17.9] Sepsis secondary to UTI ([place]) [A41.9, N39.0] Other hydronephrosis [N13.39] Diarrhea, unspecified type [R19.7] Urinary tract infection associated with indwelling urethral catheter, initial encounter ([place]) [Y70.929V, N39.0] Patient Active Problem List   Diagnosis Date Noted  . Sepsis secondary to UTI ([place]) 11/10/2020  . AKI (acute kidney injury) ([place]) 11/10/2020  . Hyperkalemia 11/10/2020  . Parkinson disease ([place]) 11/10/2020  . Hyperglycemia 11/10/2020  . Elevated LFTs 11/10/2020  . Diarrhea   . Other hydronephrosis   . History of deep vein thrombosis (DVT) of lower extremity   . Urinary retention 08/14/2019  . History of CVA with residual deficit   . BPH (benign prostatic hyperplasia)   . Orthostatic hypotension 08/13/2019  . Hyperlipidemia   . Hemiparesis affecting left side as late effect of cerebrovascular accident ([place]) 12/06/2017  . Gait abnormality 11/22/2017  . UTI (urinary tract infection) 04/22/2017  . Acute lower UTI 04/22/2017  . Tremor 04/22/2017  . Acute pyelonephritis 09/29/2012  . Nausea 09/29/2012  . Fever 09/29/2012  . HTN (hypertension) 09/29/2012  . H/O: CVA (cerebrovascular accident) 09/29/2012  . Hearing loss 09/29/2012   PCP:  [name], MD Pharmacy:   [facility] # [id number] [place] [address] [place] [address] Phone: [phone] Fax: [phone]     Social Determinants of Health (SDOH) Interventions    Readmission Risk Interventions Readmission Risk Prevention Plan 08/16/2019  Post Dischage Appt Not Complete  Appt Comments disposition tbd  Medication Screening Complete  Transportation Screening Complete  Some recent data might be hidden

## 2020-11-14 LAB — CULTURE, BLOOD (ROUTINE X 2)
Culture: NO GROWTH
Culture: NO GROWTH
Special Requests: ADEQUATE
Special Requests: ADEQUATE

## 2020-11-15 DIAGNOSIS — Z09 Encounter for follow-up examination after completed treatment for conditions other than malignant neoplasm: Secondary | ICD-10-CM | POA: Diagnosis not present

## 2020-11-15 DIAGNOSIS — E1169 Type 2 diabetes mellitus with other specified complication: Secondary | ICD-10-CM | POA: Diagnosis not present

## 2020-11-15 DIAGNOSIS — N390 Urinary tract infection, site not specified: Secondary | ICD-10-CM | POA: Diagnosis not present

## 2020-11-15 DIAGNOSIS — A419 Sepsis, unspecified organism: Secondary | ICD-10-CM | POA: Diagnosis not present

## 2020-11-25 ENCOUNTER — Other Ambulatory Visit: Payer: Self-pay | Admitting: *Deleted

## 2020-11-25 NOTE — Patient Outreach (Signed)
[facility]) Care Management  11/25/2020  Fidel Caggiano 1946/03/07 [id number]   RN Health Coach telephone call to patient.  [employer] compliance verified. Per wife she is getting her husband settled. He is not feeling well and she had called the Dr. Shelly Bombard asked if she could be any assistance and wife stated no. Per wife requests to call back at another time.   Plan: RN will call back within 30 days  [facility] Management [phone]

## 2020-11-27 ENCOUNTER — Encounter (HOSPITAL_COMMUNITY): Payer: Self-pay

## 2020-11-27 ENCOUNTER — Emergency Department (HOSPITAL_COMMUNITY): Payer: Medicare Other

## 2020-11-27 ENCOUNTER — Emergency Department (HOSPITAL_COMMUNITY)
Admission: EM | Admit: 2020-11-27 | Discharge: 2020-11-27 | Disposition: A | Discharge status: Home / Self Care | Payer: Medicare Other | Attending: Emergency Medicine | Admitting: Emergency Medicine

## 2020-11-27 ENCOUNTER — Other Ambulatory Visit: Payer: Self-pay

## 2020-11-27 DIAGNOSIS — D72829 Elevated white blood cell count, unspecified: Secondary | ICD-10-CM | POA: Insufficient documentation

## 2020-11-27 DIAGNOSIS — Z20822 Contact with and (suspected) exposure to covid-19: Secondary | ICD-10-CM | POA: Insufficient documentation

## 2020-11-27 DIAGNOSIS — J9811 Atelectasis: Secondary | ICD-10-CM | POA: Diagnosis not present

## 2020-11-27 DIAGNOSIS — I10 Essential (primary) hypertension: Secondary | ICD-10-CM | POA: Diagnosis not present

## 2020-11-27 DIAGNOSIS — R000 Tachycardia, unspecified: Secondary | ICD-10-CM | POA: Diagnosis not present

## 2020-11-27 DIAGNOSIS — Z7401 Bed confinement status: Secondary | ICD-10-CM | POA: Diagnosis not present

## 2020-11-27 DIAGNOSIS — R5381 Other malaise: Secondary | ICD-10-CM | POA: Diagnosis not present

## 2020-11-27 DIAGNOSIS — R5383 Other fatigue: Secondary | ICD-10-CM | POA: Diagnosis not present

## 2020-11-27 DIAGNOSIS — N390 Urinary tract infection, site not specified: Secondary | ICD-10-CM | POA: Diagnosis not present

## 2020-11-27 DIAGNOSIS — R531 Weakness: Secondary | ICD-10-CM | POA: Diagnosis not present

## 2020-11-27 DIAGNOSIS — Z7901 Long term (current) use of anticoagulants: Secondary | ICD-10-CM | POA: Insufficient documentation

## 2020-11-27 DIAGNOSIS — Z79899 Other long term (current) drug therapy: Secondary | ICD-10-CM | POA: Insufficient documentation

## 2020-11-27 DIAGNOSIS — M2550 Pain in unspecified joint: Secondary | ICD-10-CM | POA: Diagnosis not present

## 2020-11-27 LAB — URINALYSIS, ROUTINE W REFLEX MICROSCOPIC
Bilirubin Urine: NEGATIVE
Glucose, UA: 150 mg/dL — AB
Ketones, ur: 5 mg/dL — AB
Nitrite: NEGATIVE
Protein, ur: NEGATIVE mg/dL
Specific Gravity, Urine: 1.015 (ref 1.005–1.030)
WBC, UA: >50 WBC/hpf — ABNORMAL HIGH (ref 0–5)
pH: 5 (ref 5.0–8.0)

## 2020-11-27 LAB — HEPATIC FUNCTION PANEL
ALT: 11 U/L (ref 0–44)
AST: 15 U/L (ref 15–41)
Albumin: 3.5 g/dL (ref 3.5–5.0)
Alkaline Phosphatase: 69 U/L (ref 38–126)
Bilirubin, Direct: 0.2 mg/dL (ref 0.0–0.2)
Indirect Bilirubin: 0.6 mg/dL (ref 0.3–0.9)
Total Bilirubin: 0.8 mg/dL (ref 0.3–1.2)
Total Protein: 7.1 g/dL (ref 6.5–8.1)

## 2020-11-27 LAB — LACTIC ACID, PLASMA: Lactic Acid, Venous: 1.8 mmol/L (ref 0.5–1.9)

## 2020-11-27 LAB — BASIC METABOLIC PANEL
Anion gap: 10 (ref 5–15)
BUN: 15 mg/dL (ref 8–23)
CO2: 26 mmol/L (ref 22–32)
Calcium: 9.1 mg/dL (ref 8.9–10.3)
Chloride: 101 mmol/L (ref 98–111)
Creatinine, Ser: 1.13 mg/dL (ref 0.61–1.24)
GFR, Estimated: >60 mL/min (ref >60)
Glucose, Bld: 182 mg/dL — ABNORMAL HIGH (ref 70–99)
Potassium: 3.8 mmol/L (ref 3.5–5.1)
Sodium: 137 mmol/L (ref 135–145)

## 2020-11-27 LAB — CBC
HCT: 43.9 % (ref 39.0–52.0)
Hemoglobin: 14 g/dL (ref 13.0–17.0)
MCH: 29 pg (ref 26.0–34.0)
MCHC: 31.9 g/dL (ref 30.0–36.0)
MCV: 91.1 fL (ref 80.0–100.0)
Platelets: 314 10*3/uL (ref 150–400)
RBC: 4.82 MIL/uL (ref 4.22–5.81)
RDW: 13.2 % (ref 11.5–15.5)
WBC: 13.5 10*3/uL — ABNORMAL HIGH (ref 4.0–10.5)
nRBC: 0 % (ref 0.0–0.2)

## 2020-11-27 LAB — CBG MONITORING, ED: Glucose-Capillary: 202 mg/dL — ABNORMAL HIGH (ref 70–99)

## 2020-11-27 LAB — SARS CORONAVIRUS 2 BY RT PCR (HOSPITAL ORDER, PERFORMED IN ~~LOC~~ HOSPITAL LAB): SARS Coronavirus 2: NEGATIVE

## 2020-11-27 MED ORDER — CEPHALEXIN 500 MG PO CAPS: 500.0000 mg | ORAL_CAPSULE | Freq: Four times a day (QID) | ORAL | 0 refills | Status: AC

## 2020-11-27 MED ORDER — SODIUM CHLORIDE 0.9 % IV SOLN
2.0000 g | Freq: Once | INTRAVENOUS | Status: AC
  Administered 2020-11-27: 2 g via INTRAVENOUS
  Filled 2020-11-27: qty 20

## 2020-11-27 MED ORDER — ACETAMINOPHEN 325 MG PO TABS
650.0000 mg | ORAL_TABLET | Freq: Once | ORAL | Status: AC
  Administered 2020-11-27: 650 mg via ORAL
  Filled 2020-11-27: qty 2

## 2020-11-27 NOTE — ED Provider Notes (Signed)
[facility] DEPT Provider Note   CSN: [id number] Arrival date & time: 11/27/20  1115     History Chief Complaint  Patient presents with  . Weakness    Robert [name] is a 75 y.o. male.  Presents to ER with concern for generalized weakness, increased lethargy.  Patient reports her last few days he has had general fatigue, weakness.  No abdominal pain, low-grade fever at home up to 99.  Received Tylenol.  History of stroke, Parkinson's, bedbound at baseline.  Has chronic indwelling Foley catheter.  Recent admission for UTI.  Per review of chart, urine cultures grew multiple species, suggested recollection.  HPI     Past Medical History:  Diagnosis Date  . BPH (benign prostatic hyperplasia)   . CVA (cerebral vascular accident) ([place])    with left sided hemiparesis  . Diverticulosis   . Frequency of urination   . GERD (gastroesophageal reflux disease)   . Gross hematuria   . History of acute pyelonephritis    10-13-2012  . History of CVA with residual deficit    2008--  left side of body weakness and foot drop (wears leg brace and uses cane)  . History of DVT of lower extremity    2008--  cva  . Hypercholesteremia   . Hyperlipidemia   . Hyperlipidemia   . Hypertension   . 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 Left leg DVT ([place])   . 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 Urethral stricture   . Urgency of urination   . Urinary retention   . Weakness of left side of body    secondary to cva 2008  . Wears glasses   . Wears hearing aid    bilateral-- wears intermittantly    Patient Active Problem List   Diagnosis Date Noted  . Sepsis secondary to UTI ([place]) 11/10/2020  . AKI (acute kidney injury) ([place]) 11/10/2020  . Hyperkalemia 11/10/2020  . Parkinson disease ([place]) 11/10/2020  . Hyperglycemia 11/10/2020  . Elevated LFTs 11/10/2020  . Diarrhea   . Other hydronephrosis   . History of deep vein thrombosis  (DVT) of lower extremity   . Urinary retention 08/14/2019  . History of CVA with residual deficit   . BPH (benign prostatic hyperplasia)   . Orthostatic hypotension 08/13/2019  . Hyperlipidemia   . Hemiparesis affecting left side as late effect of cerebrovascular accident ([place]) 12/06/2017  . Gait abnormality 11/22/2017  . UTI (urinary tract infection) 04/22/2017  . Acute lower UTI 04/22/2017  . Tremor 04/22/2017  . Acute pyelonephritis 09/29/2012  . Nausea 09/29/2012  . Fever 09/29/2012  . HTN (hypertension) 09/29/2012  . H/O: CVA (cerebrovascular accident) 09/29/2012  . Hearing loss 09/29/2012    Past Surgical History:  Procedure Laterality Date  . CYSTOSCOPY WITH RETROGRADE URETHROGRAM N/A 10/23/2015   Procedure: CYSTOSCOPY WITH RETROGRADE URETHROGRAM;  Surgeon: [name], MD;  Location: [facility];  Service: Urology;  Laterality: N/A;  . CYSTOSCOPY WITH URETHRAL DILATATION N/A 10/23/2015   Procedure: CYSTOSCOPY WITH URETHRAL BALLOON DILATATION;  Surgeon: [name], MD;  [facility];  Service: Urology;  Laterality: N/A;  BALLOON DILATION   . IVC FILTER PLACEMENT ([place] HX)  2008       Family History  Problem Relation Age of Onset  . Diabetes Sister   . Lung cancer Brother        Post 9/11 voluntary  work in [employer]  . Prostate cancer Brother   . Other Mother        passed away young- non medical  . Alzheimer's disease Father   . Healthy Son     Social History   Tobacco Use  . Smoking status: Former Smoker    Years: 10.00    Types: Cigarettes    Quit date: 10/26/1970    Years since quitting: 50.1  . Smokeless tobacco: Never Used  Vaping Use  . Vaping Use: Never used  Substance Use Topics  . Alcohol use: No  . Drug use: No    Home Medications Prior to Admission medications   Medication Sig Start Date End Date Taking? Authorizing Provider  cephALEXin (KEFLEX) 500 MG capsule Take 1 capsule (500 mg total) by mouth 4  (four) times daily for 7 days. 11/27/20 12/04/20 Yes Lucrezia Starch, MD  acetaminophen (TYLENOL) 500 MG tablet Take 1,000 mg by mouth every 6 (six) hours as needed (shoulder pain).    [provider]  apixaban (ELIQUIS) 5 MG TABS tablet Take 1 tablet (5 mg total) by mouth 2 (two) times daily. 08/22/19 02/23/20  Donne Hazel, MD  busPIRone (BUSPAR) 7.5 MG tablet Take 7.5 mg by mouth 2 (two) times daily. 1 and 1/2 tablet(s)    [provider]  Carbidopa-Levodopa ER (SINEMET CR) 25-100 MG tablet controlled release TAKE TWO TABLETS BY MOUTH THREE TIMES DAILY  Patient taking differently: Take 2 tablets by mouth in the morning, at noon, and at bedtime. Takes 2 tablets in the morning, 2 tablets in the afternoon, and 1 tablet at night 03/05/20   Tat, Rebecca S, DO  lidocaine (LIDODERM) 5 % Place 1 patch onto the skin daily. Remove & Discard patch within 12 hours or as directed by MD 02/23/20   Dorie Rank, MD  losartan (COZAAR) 50 MG tablet Take 50 mg by mouth daily.    [provider]  Pimavanserin Tartrate (NUPLAZID) 34 MG CAPS Take 1 capsule by mouth daily.    [provider]  polyethylene glycol (MIRALAX) packet Take 17 g by mouth daily. Patient taking differently: Take 17 g by mouth daily as needed for mild constipation. 05/08/17   Mackuen, Courteney Lyn, MD  rasagiline (AZILECT) 0.5 MG TABS tablet Take 1 mg by mouth daily.    [provider]  simvastatin (ZOCOR) 20 MG tablet Take 20 mg by mouth every evening.    [provider]  tamsulosin (FLOMAX) 0.4 MG CAPS capsule Take 0.4 mg by mouth daily.    [provider]    Allergies    Propofol  Review of Systems   Review of Systems  Constitutional: Positive for chills and fatigue. Negative for fever.  HENT: Negative for ear pain and sore throat.   Eyes: Negative for pain and visual disturbance.  Respiratory: Negative for cough and shortness of breath.   Cardiovascular: Negative for chest  pain and palpitations.  Gastrointestinal: Negative for abdominal pain and vomiting.  Genitourinary: Negative for dysuria and hematuria.  Musculoskeletal: Negative for arthralgias and back pain.  Skin: Negative for color change and rash.  Neurological: Positive for weakness. Negative for seizures and syncope.  All other systems reviewed and are negative.   Physical Exam Updated Vital Signs BP (!) 146/68   Pulse 88   Temp 98.5 F (36.9 C) (Oral)   Resp (!) 23   Ht 6\' 2"  (1.88 m)   SpO2 98%   BMI 30.81 kg/m  Physical Exam Vitals and nursing note reviewed.  Constitutional:      Appearance: He is well-developed and well-nourished.  HENT:     Head: Normocephalic and atraumatic.  Eyes:     Conjunctiva/sclera: Conjunctivae normal.  Cardiovascular:     Rate and Rhythm: Normal rate and regular rhythm.     Heart sounds: No murmur heard.   Pulmonary:     Effort: Pulmonary effort is normal. No respiratory distress.     Breath sounds: Normal breath sounds.  Abdominal:     Palpations: Abdomen is soft.     Tenderness: There is no abdominal tenderness.  Genitourinary:    Comments: Foley catheter in place Musculoskeletal:        General: No edema.     Cervical back: Neck supple.  Skin:    General: Skin is warm and dry.  Neurological:     Mental Status: He is alert.     Comments: Alert, oriented  Psychiatric:        Mood and Affect: Mood and affect and mood normal.        Behavior: Behavior normal.     ED Results / Procedures / Treatments   Labs (all labs ordered are listed, but only abnormal results are displayed) Labs Reviewed  BASIC METABOLIC PANEL - Abnormal; Notable for the following components:      Result Value   Glucose, Bld 182 (*)    All other components within normal limits  CBC - Abnormal; Notable for the following components:   WBC 13.5 (*)    All other components within normal limits  URINALYSIS, ROUTINE W REFLEX MICROSCOPIC - Abnormal; Notable for the  following components:   [place]ance HAZY (*)    Glucose, UA 150 (*)    Hgb urine dipstick SMALL (*)    Ketones, ur 5 (*)    Leukocytes,Ua LARGE (*)    WBC, UA >50 (*)    Bacteria, UA FEW (*)    All other components within normal limits  CBG MONITORING, ED - Abnormal; Notable for the following components:   Glucose-Capillary 202 (*)    All other components within normal limits  SARS CORONAVIRUS 2 BY RT PCR (HOSPITAL ORDER, [facility])  URINE CULTURE  CULTURE, BLOOD (ROUTINE X 2)  CULTURE, BLOOD (ROUTINE X 2)  LACTIC ACID, PLASMA  HEPATIC FUNCTION PANEL    EKG EKG Interpretation  Date/Time:  Wednesday November 27 2020 11:28:24 EST Ventricular Rate:  100 PR Interval:    QRS Duration: 101 QT Interval:  333 QTC Calculation: 430 R Axis:   -6 Text Interpretation: Sinus tachycardia Abnormal R-wave progression, early transition Borderline repolarization abnormality Artifact in lead(s) I Confirmed by Madalyn Rob [phone]) on 11/27/2020 12:21:38 PM   Radiology DG Chest Portable 1 View  Result Date: 11/27/2020 CLINICAL DATA:  Fever and weakness EXAM: PORTABLE CHEST 1 VIEW COMPARISON:  November 09, 2020 FINDINGS: The heart size and mediastinal contours are unchanged. Streaky subsegmental atelectasis in the left lung base, unchanged. No new focal consolidation. No pleural effusion. No pneumothorax. The visualized skeletal structures are unchanged. IMPRESSION: No acute cardiopulmonary findings. Streaky left basilar subsegmental atelectasis is unchanged. Electronically Signed   By: Dahlia Bailiff MD   On: 11/27/2020 12:42    Procedures Procedures   Medications Ordered in ED Medications  cefTRIAXone (ROCEPHIN) 2 g in sodium chloride 0.9 % 100 mL IVPB (0 g Intravenous Stopped 11/27/20 1343)    ED Course  I have reviewed the triage  vital signs and the nursing notes.  Pertinent labs & imaging results that were available during my care of the patient were reviewed  by me and considered in my medical decision making (see chart for details).    MDM Rules/Calculators/A&P                         75 year old male presenting to ER with concern for general fatigue as well as low-grade fever.  On exam patient is well-appearing in no acute distress.  Vital signs are stable.  Basic labs are noted for mild leukocytosis but otherwise grossly normal.  UA with likely UTI.  CXR negative for any acute findings, negative for pneumonia.  Covid negative.  Suspect symptoms may be related to his UTI.  Multiple species on last culture.  Will resend urine culture.  Gave dose of Rocephin.  At present believe patient is stable for discharge and outpatient management.  Recommended follow-up with urology as well as primary doctors.  Will give course of cephalexin.  Final Clinical Impression(s) / ED Diagnoses Final diagnoses:  Urinary tract infection without hematuria, site unspecified  Leukocytosis, unspecified type    Rx / DC Orders ED Discharge Orders         Ordered    cephALEXin (KEFLEX) 500 MG capsule  4 times daily        11/27/20 1432           Lucrezia Starch, MD 11/27/20 1538

## 2020-11-27 NOTE — Discharge Instructions (Signed)
Take antibiotic as prescribed.  Follow-up with both your primary doctor and urology.  If you develop fever, abdominal pain, vomiting or other new concerning symptom, return to ER for reassessment.

## 2020-11-27 NOTE — ED Notes (Signed)
Pt and wife verbalized dc instructions and follow up care. Alert and leaving with ptar via stretcher. No iv. Wife has all belongings. Vitals stable prior to dc

## 2020-11-27 NOTE — ED Triage Notes (Signed)
Per ems: Pt coming from home c/o generalized weakness and increased lethargy per wife. Recently admitted for UTI. Hx stroke and parkinsons and bed bound at baseline. Fever of 99-101 treated with tylenol. Foley currently in place  144/90 100 hr 99.5 temp 20rr 97% RA 293 cbg  18 R AC

## 2020-11-29 LAB — URINE CULTURE: Culture: >=100000 — AB

## 2020-11-30 NOTE — Progress Notes (Signed)
ED Antimicrobial Stewardship Positive Culture Follow Up   Robert [name] is an 75 y.o. male who presented to [facility] on 11/27/2020 with a chief complaint of  Chief Complaint  Patient presents with  . Weakness    Recent Results (from the past 720 hour(s))  Urine culture     Status: Abnormal   Collection Time: 11/09/20  9:39 PM   Specimen: In/Out Cath Urine  Result Value Ref Range Status   Specimen Description   Final    IN/OUT CATH URINE Performed at [facility] [address] [place], [place] [address]    Special Requests   Final    NONE Performed at [facility], [place] [address], [place], [place] [address]    Culture MULTIPLE SPECIES PRESENT, SUGGEST RECOLLECTION (A)  Final   Report Status 11/11/2020 FINAL  Final  Blood Culture (routine x 2)     Status: None   Collection Time: 11/09/20  9:39 PM   Specimen: BLOOD  Result Value Ref Range Status   Specimen Description   Final    BLOOD RIGHT ANTECUBITAL Performed at [facility], [place] [address] [place], [place] [address]    Special Requests   Final    BOTTLES DRAWN AEROBIC AND ANAEROBIC Blood Culture adequate volume Performed at [facility] [address], [place], [place] [address]    Culture   Final    NO GROWTH 5 DAYS Performed at [facility], [place] [address] [place], [place] [address]    Report Status 11/14/2020 FINAL  Final  Resp Panel by RT-PCR (Flu A&B, Covid) Nasopharyngeal Swab     Status: None   Collection Time: 11/09/20  9:41 PM   Specimen: Nasopharyngeal Swab; Nasopharyngeal(NP) swabs in vial transport medium  Result Value Ref Range Status   SARS Coronavirus 2 by RT PCR NEGATIVE NEGATIVE Final    Comment: (NOTE) SARS-CoV-2 target nucleic acids are NOT DETECTED.  The SARS-CoV-2 RNA is generally detectable in upper respiratory specimens during the acute phase of infection. The lowest concentration of SARS-CoV-2 viral copies this assay  can detect is 138 copies/mL. A negative result does not preclude SARS-Cov-2 infection and should not be used as the sole basis for treatment or other patient management decisions. A negative result may occur with  improper specimen collection/handling, submission of specimen other than nasopharyngeal swab, presence of viral mutation(s) within the areas targeted by this assay, and inadequate number of viral copies(<138 copies/mL). A negative result must be combined with clinical observations, patient history, and epidemiological information. The expected result is Negative.  Fact Sheet for Patients:  EntrepreneurPulse.com.au  Fact Sheet for Healthcare Providers:  IncredibleEmployment.be  This test is no t yet approved or cleared by the [place] FDA and  has been authorized for detection and/or diagnosis of SARS-CoV-2 by FDA under an Emergency Use Authorization (EUA). This EUA will remain  in effect (meaning this test can be used) for the duration of the COVID-19 declaration under Section 564(b)(1) of the Act, 21 U.S.C.section 360bbb-3(b)(1), unless the authorization is terminated  or revoked sooner.       Influenza A by PCR NEGATIVE NEGATIVE Final   Influenza B by PCR NEGATIVE NEGATIVE Final    Comment: (NOTE) The Xpert Xpress SARS-CoV-2/FLU/RSV plus assay is intended as an aid in the diagnosis of influenza from Nasopharyngeal swab specimens and should not be used as a sole basis for treatment. Nasal washings and aspirates are unacceptable for Xpert  Xpress SARS-CoV-2/FLU/RSV testing.  Fact Sheet for Patients: EntrepreneurPulse.com.au  Fact Sheet for Healthcare Providers: IncredibleEmployment.be  This test is not yet approved or cleared by the [place] FDA and has been authorized for detection and/or diagnosis of SARS-CoV-2 by FDA under an Emergency Use Authorization (EUA). This EUA will  remain in effect (meaning this test can be used) for the duration of the COVID-19 declaration under Section 564(b)(1) of the Act, 21 U.S.C. section 360bbb-3(b)(1), unless the authorization is terminated or revoked.  Performed at [facility], [place] [address], [place], [place] [address]   Blood Culture (routine x 2)     Status: None   Collection Time: 11/09/20  9:44 PM   Specimen: BLOOD RIGHT FOREARM  Result Value Ref Range Status   Specimen Description   Final    BLOOD RIGHT FOREARM Performed at [facility] [address] [place] [address] [place], [place] [address]    Special Requests   Final    BOTTLES DRAWN AEROBIC AND ANAEROBIC Blood Culture adequate volume Performed at [facility] [address], [place], [place] [address]    Culture   Final    NO GROWTH 5 DAYS Performed at [facility], [place] [address], [place], [place] [address]    Report Status 11/14/2020 FINAL  Final  Blood culture (routine x 2)     Status: None (Preliminary result)   Collection Time: 11/27/20 11:30 AM   Specimen: BLOOD  Result Value Ref Range Status   Specimen Description   Final    BLOOD RIGHT ANTECUBITAL Performed at [facility] [address] [place], [place] [address]    Special Requests   Final    BOTTLES DRAWN AEROBIC AND ANAEROBIC Blood Culture adequate volume Performed at [facility] [address], [place], [place] [address]    Culture   Final    NO GROWTH 3 DAYS Performed at [facility], [place] [address], [place], [place] [address]    Report Status PENDING  Incomplete  Urine culture     Status: Abnormal   Collection Time: 11/27/20 11:34 AM   Specimen: Urine, Clean Catch  Result Value Ref Range Status   Specimen Description   Final    URINE, CLEAN CATCH Performed at [facility], [place] [address] [place], [place] [address]    Special Requests   Final     NONE Performed at [facility], [place] [address], [place], [place] [address]    Culture (A)  Final    >=100,000 COLONIES/mL KLEBSIELLA PNEUMONIAE 50,000 COLONIES/mL ENTEROCOCCUS FAECALIS    Report Status 11/29/2020 FINAL  Final   Organism ID, Bacteria KLEBSIELLA PNEUMONIAE (A)  Final   Organism ID, Bacteria ENTEROCOCCUS FAECALIS (A)  Final      Susceptibility   Enterococcus faecalis - MIC*    AMPICILLIN <=2 SENSITIVE Sensitive     NITROFURANTOIN <=16 SENSITIVE Sensitive     VANCOMYCIN <=0.5 SENSITIVE Sensitive     * 50,000 COLONIES/mL ENTEROCOCCUS FAECALIS   Klebsiella pneumoniae - MIC*    AMPICILLIN >=32 RESISTANT Resistant     CEFAZOLIN <=4 SENSITIVE Sensitive     CEFEPIME <=0.12 SENSITIVE Sensitive     CEFTRIAXONE <=0.25 SENSITIVE Sensitive     CIPROFLOXACIN <=0.25 SENSITIVE Sensitive     GENTAMICIN <=1 SENSITIVE Sensitive     IMIPENEM <=0.25 SENSITIVE Sensitive     NITROFURANTOIN 64 INTERMEDIATE Intermediate     TRIMETH/SULFA <=20 SENSITIVE Sensitive  AMPICILLIN/SULBACTAM 4 SENSITIVE Sensitive     PIP/TAZO <=4 SENSITIVE Sensitive     * >=100,000 COLONIES/mL KLEBSIELLA PNEUMONIAE  SARS Coronavirus 2 by RT PCR (hospital order, performed in [facility] hospital lab) Nasopharyngeal Nasopharyngeal Swab     Status: None   Collection Time: 11/27/20 11:58 AM   Specimen: Nasopharyngeal Swab  Result Value Ref Range Status   SARS Coronavirus 2 NEGATIVE NEGATIVE Final    Comment: (NOTE) SARS-CoV-2 target nucleic acids are NOT DETECTED.  The SARS-CoV-2 RNA is generally detectable in upper and lower respiratory specimens during the acute phase of infection. The lowest concentration of SARS-CoV-2 viral copies this assay can detect is 250 copies / mL. A negative result does not preclude SARS-CoV-2 infection and should not be used as the sole basis for treatment or other patient management decisions.  A negative result may occur with improper specimen collection /  handling, submission of specimen other than nasopharyngeal swab, presence of viral mutation(s) within the areas targeted by this assay, and inadequate number of viral copies (<250 copies / mL). A negative result must be combined with clinical observations, patient history, and epidemiological information.  Fact Sheet for Patients:   StrictlyIdeas.no  Fact Sheet for Healthcare Providers: BankingDealers.co.za  This test is not yet approved or  cleared by the [place] FDA and has been authorized for detection and/or diagnosis of SARS-CoV-2 by FDA under an Emergency Use Authorization (EUA).  This EUA will remain in effect (meaning this test can be used) for the duration of the COVID-19 declaration under Section 564(b)(1) of the Act, 21 U.S.C. section 360bbb-3(b)(1), unless the authorization is terminated or revoked sooner.  Performed at [facility], [place] [address], [place], [place] [address]   Blood culture (routine x 2)     Status: None (Preliminary result)   Collection Time: 11/27/20 12:08 PM   Specimen: BLOOD RIGHT HAND  Result Value Ref Range Status   Specimen Description   Final    BLOOD RIGHT HAND Performed at [facility] [address], [place], [place] [address]    Special Requests   Final    BOTTLES DRAWN AEROBIC AND ANAEROBIC Blood Culture adequate volume Performed at [facility] [address] [place], [place] [address]    Culture   Final    NO GROWTH 3 DAYS Performed at [facility], [place] [address], [place],  [address]    Report Status PENDING  Incomplete    [x]  Treated with cephalexin, one of the organisms resistant to prescribed antimicrobial []  Patient discharged originally without antimicrobial agent and treatment is now indicated  New antibiotic prescription:  - Stop cephalexin and started Augmentin 875 mg PO BID x 5 days  ED  Provider: Domenic Moras, PA    Royetta Asal, PharmD, BCPS 11/30/2020 11:26 AM

## 2020-12-01 ENCOUNTER — Telehealth: Payer: Self-pay | Admitting: Emergency Medicine

## 2020-12-01 NOTE — Telephone Encounter (Signed)
Post ED Visit - Positive Culture Follow-up: Successful Patient Follow-Up  Culture assessed and recommendations reviewed by:  []  Elenor Quinones, Pharm.D. []  Heide Guile, Pharm.D., BCPS AQ-ID []  Parks Neptune, Pharm.D., BCPS []  Alycia Rossetti, Pharm.D., BCPS []  [place], Pharm.D., BCPS, AAHIVP []  Legrand Como, Pharm.D., BCPS, AAHIVP []  Salome Arnt, PharmD, BCPS []  Johnnette Gourd, PharmD, BCPS []  Hughes Better, PharmD, BCPS [x]  Royetta Asal, PharmD  Positive urine culture  []  Patient discharged without antimicrobial prescription and treatment is now indicated []  Organism is resistant to prescribed ED discharge antimicrobial []  Patient with positive blood cultures  Changes discussed with ED provider: Domenic Moras PA New antibiotic prescription: Augmentin 875 mg PO BID x five days Called to [place] [phone])  Contacted patient, date 12/01/2020, time [place] 12/01/2020, 4:41 PM

## 2020-12-02 LAB — CULTURE, BLOOD (ROUTINE X 2)
Culture: NO GROWTH
Culture: NO GROWTH
Special Requests: ADEQUATE
Special Requests: ADEQUATE

## 2020-12-03 ENCOUNTER — Ambulatory Visit: Payer: Medicare Other | Admitting: *Deleted

## 2020-12-05 DIAGNOSIS — R42 Dizziness and giddiness: Secondary | ICD-10-CM | POA: Diagnosis not present

## 2020-12-05 DIAGNOSIS — N390 Urinary tract infection, site not specified: Secondary | ICD-10-CM | POA: Diagnosis not present

## 2020-12-12 ENCOUNTER — Ambulatory Visit: Payer: Self-pay | Admitting: *Deleted

## 2020-12-26 DIAGNOSIS — L723 Sebaceous cyst: Secondary | ICD-10-CM | POA: Diagnosis not present

## 2020-12-31 ENCOUNTER — Other Ambulatory Visit: Payer: Self-pay | Admitting: *Deleted

## 2020-12-31 NOTE — Patient Outreach (Signed)
[facility]) Care Management  12/31/2020  Robert [name] January 25, 1946 [id number]   RN Health Coach attempted follow up outreach call to patient.  RN spoke with wife. Per wife the patient was in the shower and she had to do his skin care and could not talk with me.   Plan: RN sent unsuccessful outreach letter   [place] [facility] [phone]

## 2021-01-04 ENCOUNTER — Emergency Department (HOSPITAL_COMMUNITY): Payer: Medicare Other

## 2021-01-04 ENCOUNTER — Encounter (HOSPITAL_COMMUNITY): Payer: Self-pay | Admitting: Radiology

## 2021-01-04 ENCOUNTER — Other Ambulatory Visit: Payer: Self-pay

## 2021-01-04 ENCOUNTER — Inpatient Hospital Stay (HOSPITAL_COMMUNITY)
Admission: EM | Admit: 2021-01-04 | Discharge: 2021-01-13 | DRG: 987 | Disposition: A | Discharge status: Skilled Nursing Facility | Payer: Medicare Other | Attending: Family Medicine | Admitting: Family Medicine

## 2021-01-04 DIAGNOSIS — Z20822 Contact with and (suspected) exposure to covid-19: Secondary | ICD-10-CM | POA: Diagnosis present

## 2021-01-04 DIAGNOSIS — Z87891 Personal history of nicotine dependence: Secondary | ICD-10-CM

## 2021-01-04 DIAGNOSIS — Z7901 Long term (current) use of anticoagulants: Secondary | ICD-10-CM

## 2021-01-04 DIAGNOSIS — K838 Other specified diseases of biliary tract: Secondary | ICD-10-CM | POA: Diagnosis not present

## 2021-01-04 DIAGNOSIS — K828 Other specified diseases of gallbladder: Secondary | ICD-10-CM | POA: Diagnosis not present

## 2021-01-04 DIAGNOSIS — R7989 Other specified abnormal findings of blood chemistry: Secondary | ICD-10-CM | POA: Diagnosis present

## 2021-01-04 DIAGNOSIS — Z86718 Personal history of other venous thrombosis and embolism: Secondary | ICD-10-CM

## 2021-01-04 DIAGNOSIS — K5900 Constipation, unspecified: Secondary | ICD-10-CM | POA: Diagnosis present

## 2021-01-04 DIAGNOSIS — Y846 Urinary catheterization as the cause of abnormal reaction of the patient, or of later complication, without mention of misadventure at the time of the procedure: Secondary | ICD-10-CM | POA: Diagnosis present

## 2021-01-04 DIAGNOSIS — R1110 Vomiting, unspecified: Secondary | ICD-10-CM | POA: Diagnosis not present

## 2021-01-04 DIAGNOSIS — K851 Biliary acute pancreatitis without necrosis or infection: Secondary | ICD-10-CM | POA: Diagnosis not present

## 2021-01-04 DIAGNOSIS — E669 Obesity, unspecified: Secondary | ICD-10-CM | POA: Diagnosis present

## 2021-01-04 DIAGNOSIS — Z8042 Family history of malignant neoplasm of prostate: Secondary | ICD-10-CM

## 2021-01-04 DIAGNOSIS — R531 Weakness: Secondary | ICD-10-CM | POA: Diagnosis not present

## 2021-01-04 DIAGNOSIS — Z515 Encounter for palliative care: Secondary | ICD-10-CM

## 2021-01-04 DIAGNOSIS — N390 Urinary tract infection, site not specified: Secondary | ICD-10-CM | POA: Diagnosis present

## 2021-01-04 DIAGNOSIS — K8010 Calculus of gallbladder with chronic cholecystitis without obstruction: Secondary | ICD-10-CM | POA: Diagnosis present

## 2021-01-04 DIAGNOSIS — N400 Enlarged prostate without lower urinary tract symptoms: Secondary | ICD-10-CM | POA: Diagnosis present

## 2021-01-04 DIAGNOSIS — Z974 Presence of external hearing-aid: Secondary | ICD-10-CM

## 2021-01-04 DIAGNOSIS — I6930 Unspecified sequelae of cerebral infarction: Secondary | ICD-10-CM

## 2021-01-04 DIAGNOSIS — Z888 Allergy status to other drugs, medicaments and biological substances status: Secondary | ICD-10-CM

## 2021-01-04 DIAGNOSIS — I10 Essential (primary) hypertension: Secondary | ICD-10-CM | POA: Diagnosis present

## 2021-01-04 DIAGNOSIS — L8990 Pressure ulcer of unspecified site, unspecified stage: Secondary | ICD-10-CM | POA: Insufficient documentation

## 2021-01-04 DIAGNOSIS — K219 Gastro-esophageal reflux disease without esophagitis: Secondary | ICD-10-CM | POA: Diagnosis present

## 2021-01-04 DIAGNOSIS — Z833 Family history of diabetes mellitus: Secondary | ICD-10-CM

## 2021-01-04 DIAGNOSIS — E785 Hyperlipidemia, unspecified: Secondary | ICD-10-CM | POA: Diagnosis present

## 2021-01-04 DIAGNOSIS — Z820 Family history of epilepsy and other diseases of the nervous system: Secondary | ICD-10-CM

## 2021-01-04 DIAGNOSIS — Z801 Family history of malignant neoplasm of trachea, bronchus and lung: Secondary | ICD-10-CM

## 2021-01-04 DIAGNOSIS — Z79899 Other long term (current) drug therapy: Secondary | ICD-10-CM

## 2021-01-04 DIAGNOSIS — T83511A Infection and inflammatory reaction due to indwelling urethral catheter, initial encounter: Principal | ICD-10-CM | POA: Diagnosis present

## 2021-01-04 DIAGNOSIS — R21 Rash and other nonspecific skin eruption: Secondary | ICD-10-CM | POA: Diagnosis present

## 2021-01-04 DIAGNOSIS — K8020 Calculus of gallbladder without cholecystitis without obstruction: Secondary | ICD-10-CM | POA: Diagnosis not present

## 2021-01-04 DIAGNOSIS — R000 Tachycardia, unspecified: Secondary | ICD-10-CM | POA: Diagnosis not present

## 2021-01-04 DIAGNOSIS — G20 Parkinson's disease: Secondary | ICD-10-CM | POA: Diagnosis present

## 2021-01-04 DIAGNOSIS — A419 Sepsis, unspecified organism: Secondary | ICD-10-CM | POA: Diagnosis not present

## 2021-01-04 DIAGNOSIS — R6510 Systemic inflammatory response syndrome (SIRS) of non-infectious origin without acute organ dysfunction: Secondary | ICD-10-CM

## 2021-01-04 DIAGNOSIS — Z6831 Body mass index (BMI) 31.0-31.9, adult: Secondary | ICD-10-CM

## 2021-01-04 DIAGNOSIS — E871 Hypo-osmolality and hyponatremia: Secondary | ICD-10-CM | POA: Diagnosis not present

## 2021-01-04 DIAGNOSIS — Z8673 Personal history of transient ischemic attack (TIA), and cerebral infarction without residual deficits: Secondary | ICD-10-CM

## 2021-01-04 DIAGNOSIS — K859 Acute pancreatitis without necrosis or infection, unspecified: Secondary | ICD-10-CM

## 2021-01-04 DIAGNOSIS — B961 Klebsiella pneumoniae [K. pneumoniae] as the cause of diseases classified elsewhere: Secondary | ICD-10-CM | POA: Diagnosis present

## 2021-01-04 DIAGNOSIS — E7800 Pure hypercholesterolemia, unspecified: Secondary | ICD-10-CM | POA: Diagnosis present

## 2021-01-04 DIAGNOSIS — I69354 Hemiplegia and hemiparesis following cerebral infarction affecting left non-dominant side: Secondary | ICD-10-CM | POA: Diagnosis not present

## 2021-01-04 DIAGNOSIS — Z419 Encounter for procedure for purposes other than remedying health state, unspecified: Secondary | ICD-10-CM

## 2021-01-04 DIAGNOSIS — L89152 Pressure ulcer of sacral region, stage 2: Secondary | ICD-10-CM | POA: Diagnosis present

## 2021-01-04 DIAGNOSIS — Z885 Allergy status to narcotic agent status: Secondary | ICD-10-CM

## 2021-01-04 DIAGNOSIS — Z978 Presence of other specified devices: Secondary | ICD-10-CM

## 2021-01-04 DIAGNOSIS — Z7189 Other specified counseling: Secondary | ICD-10-CM

## 2021-01-04 DIAGNOSIS — R509 Fever, unspecified: Secondary | ICD-10-CM | POA: Diagnosis not present

## 2021-01-04 DIAGNOSIS — Z95828 Presence of other vascular implants and grafts: Secondary | ICD-10-CM

## 2021-01-04 DIAGNOSIS — G20A1 Parkinson's disease without dyskinesia, without mention of fluctuations: Secondary | ICD-10-CM

## 2021-01-04 DIAGNOSIS — K8500 Idiopathic acute pancreatitis without necrosis or infection: Secondary | ICD-10-CM

## 2021-01-04 LAB — CBC WITH DIFFERENTIAL/PLATELET
Abs Immature Granulocytes: 0.06 10*3/uL (ref 0.00–0.07)
Basophils Absolute: 0 10*3/uL (ref 0.0–0.1)
Basophils Relative: 0 %
Eosinophils Absolute: 0 10*3/uL (ref 0.0–0.5)
Eosinophils Relative: 0 %
HCT: 46.2 % (ref 39.0–52.0)
Hemoglobin: 14.9 g/dL (ref 13.0–17.0)
Immature Granulocytes: 1 %
Lymphocytes Relative: 5 %
Lymphs Abs: 0.6 10*3/uL — ABNORMAL LOW (ref 0.7–4.0)
MCH: 29.6 pg (ref 26.0–34.0)
MCHC: 32.3 g/dL (ref 30.0–36.0)
MCV: 91.8 fL (ref 80.0–100.0)
Monocytes Absolute: 1.8 10*3/uL — ABNORMAL HIGH (ref 0.1–1.0)
Monocytes Relative: 14 %
Neutro Abs: 10.8 10*3/uL — ABNORMAL HIGH (ref 1.7–7.7)
Neutrophils Relative %: 80 %
Platelets: 270 10*3/uL (ref 150–400)
RBC: 5.03 MIL/uL (ref 4.22–5.81)
RDW: 13.7 % (ref 11.5–15.5)
WBC: 13.3 10*3/uL — ABNORMAL HIGH (ref 4.0–10.5)
nRBC: 0 % (ref 0.0–0.2)

## 2021-01-04 LAB — URINALYSIS, ROUTINE W REFLEX MICROSCOPIC
Bilirubin Urine: NEGATIVE
Glucose, UA: 50 mg/dL — AB
Ketones, ur: NEGATIVE mg/dL
Nitrite: POSITIVE — AB
Protein, ur: NEGATIVE mg/dL
Specific Gravity, Urine: 1.009 (ref 1.005–1.030)
WBC, UA: >50 WBC/hpf — ABNORMAL HIGH (ref 0–5)
pH: 7 (ref 5.0–8.0)

## 2021-01-04 LAB — COMPREHENSIVE METABOLIC PANEL
ALT: 68 U/L — ABNORMAL HIGH (ref 0–44)
AST: 138 U/L — ABNORMAL HIGH (ref 15–41)
Albumin: 3.5 g/dL (ref 3.5–5.0)
Alkaline Phosphatase: 131 U/L — ABNORMAL HIGH (ref 38–126)
Anion gap: 13 (ref 5–15)
BUN: 8 mg/dL (ref 8–23)
CO2: 21 mmol/L — ABNORMAL LOW (ref 22–32)
Calcium: 8.9 mg/dL (ref 8.9–10.3)
Chloride: 99 mmol/L (ref 98–111)
Creatinine, Ser: 0.93 mg/dL (ref 0.61–1.24)
GFR, Estimated: >60 mL/min (ref >60)
Glucose, Bld: 196 mg/dL — ABNORMAL HIGH (ref 70–99)
Potassium: 3.9 mmol/L (ref 3.5–5.1)
Sodium: 133 mmol/L — ABNORMAL LOW (ref 135–145)
Total Bilirubin: 2.9 mg/dL — ABNORMAL HIGH (ref 0.3–1.2)
Total Protein: 7.1 g/dL (ref 6.5–8.1)

## 2021-01-04 LAB — LIPASE, BLOOD: Lipase: 279 U/L — ABNORMAL HIGH (ref 11–51)

## 2021-01-04 LAB — BRAIN NATRIURETIC PEPTIDE: B Natriuretic Peptide: 111.1 pg/mL — ABNORMAL HIGH (ref 0.0–100.0)

## 2021-01-04 LAB — CBG MONITORING, ED: Glucose-Capillary: 179 mg/dL — ABNORMAL HIGH (ref 70–99)

## 2021-01-04 MED ORDER — IOHEXOL 300 MG/ML  SOLN
100.0000 mL | Freq: Once | INTRAMUSCULAR | Status: AC | PRN
  Administered 2021-01-04: 100 mL via INTRAVENOUS

## 2021-01-04 MED ORDER — ACETAMINOPHEN 325 MG PO TABS
650.0000 mg | ORAL_TABLET | Freq: Once | ORAL | Status: AC
  Administered 2021-01-04: 650 mg via ORAL
  Filled 2021-01-04: qty 2

## 2021-01-04 MED ORDER — SODIUM CHLORIDE 0.9 % IV SOLN
1.0000 g | Freq: Once | INTRAVENOUS | Status: AC
  Administered 2021-01-04: 1 g via INTRAVENOUS
  Filled 2021-01-04: qty 10

## 2021-01-04 MED ORDER — ONDANSETRON HCL 4 MG/2ML IJ SOLN
4.0000 mg | Freq: Once | INTRAMUSCULAR | Status: AC
  Administered 2021-01-04: 4 mg via INTRAVENOUS
  Filled 2021-01-04: qty 2

## 2021-01-04 NOTE — H&P (Signed)
History and Physical   [facility] [place] [facility] [place] [facility] Admission History and Physical [employer], D.O.    Patient Name: Robert [name] MR#: [id number] Date of Birth: May 07, 1946 Date of Admission: 01/04/2021  Referring MD/NP/PA: Dr. Jeraldine Loots Primary Care Physician: Farris Has, MD  Chief Complaint:  Chief Complaint  Patient presents with  . Vomiting    HPI: Robert [name] is a 75 y.o. 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  Patient was in a usual state of health until today when he developed fever to T-max of 100.9, vomiting with p.o. intolerance, weakness.  He is usually conversive and ambulates with cane at home.   Urine has been dark, amber.     EMS/ED Course: Patient received Rocephin, Tylenol, Zofran, normal saline. Medical admission has been requested for further management of sepsis secondary to urinary tract infection.  Review of Systems:  CONSTITUTIONAL: Positive fever/chills, fatigue, weakness, negative weight gain/loss, headache. EYES: No blurry or double vision. ENT: No tinnitus, postnasal drip, redness or soreness of the oropharynx. RESPIRATORY: No cough, dyspnea, wheeze.  No hemoptysis.  CARDIOVASCULAR: No chest pain, palpitations, syncope, orthopnea. No lower extremity edema.  GASTROINTESTINAL: Positive nausea, vomiting, abdominal pain, diarrhea, constipation.  No hematemesis, melena or hematochezia. GENITOURINARY: No dysuria, frequency, hematuria. ENDOCRINE: No polyuria or nocturia. No heat or cold intolerance. HEMATOLOGY: No anemia, bruising, bleeding. INTEGUMENTARY: No rashes, ulcers, lesions. MUSCULOSKELETAL: No arthritis, gout. NEUROLOGIC: No numbness, tingling, ataxia, seizure-type activity. PSYCHIATRIC: No anxiety, depression,  insomnia.   Past Medical History:  Diagnosis Date  . BPH (benign prostatic hyperplasia)   . CVA (cerebral vascular accident) (HCC)    with left sided hemiparesis  . Diverticulosis   . Frequency of urination   . GERD (gastroesophageal reflux disease)   . Gross hematuria   . History of acute pyelonephritis    10-13-2012  . History of CVA with residual deficit    2008--  left side of body weakness and foot drop (wears leg brace and uses cane)  . History of DVT of lower extremity    2008--  cva  . Hypercholesteremia   . Hyperlipidemia   . Hyperlipidemia   . Hypertension   . 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 Left leg DVT (HCC)   . 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 Urethral stricture   . Urgency of urination   . Urinary retention   . Weakness of left side of body    secondary to cva 2008  . Wears glasses   . Wears hearing aid    bilateral-- wears intermittantly    Past Surgical History:  Procedure Laterality Date  . CYSTOSCOPY WITH RETROGRADE URETHROGRAM N/A 10/23/2015   Procedure: CYSTOSCOPY WITH RETROGRADE URETHROGRAM;  Surgeon: Barron Alvine, MD;  Location: [facility];  Service: Urology;  Laterality: N/A;  . CYSTOSCOPY WITH URETHRAL DILATATION N/A 10/23/2015   Procedure: CYSTOSCOPY WITH URETHRAL BALLOON DILATATION;  Surgeon: Barron Alvine, MD;  Location: [facility];  Service: Urology;  Laterality: N/A;  BALLOON DILATION   . IVC FILTER PLACEMENT ([facility] HX)  2008     reports that he quit smoking about 50 years ago. His smoking use included cigarettes. He quit after 10.00 years of use.  He has never used smokeless tobacco. He reports that he does not drink alcohol and does not use drugs.  Allergies  Allergen Reactions  . Propofol Other (See Comments)    "hiccups for weeks"    Family History  Problem Relation Age of Onset  . Diabetes Sister   . Lung cancer Brother        Post 9/11 voluntary work in [place]   . Prostate cancer Brother   . Other Mother        passed away young- non medical  . Alzheimer's disease Father   . Healthy Son     Prior to Admission medications   Medication Sig Start Date End Date Taking? Authorizing Provider  acetaminophen (TYLENOL) 500 MG tablet Take 1,000 mg by mouth every 6 (six) hours as needed (shoulder pain).    [provider]  apixaban (ELIQUIS) 5 MG TABS tablet Take 1 tablet (5 mg total) by mouth 2 (two) times daily. 08/22/19 02/23/20  Jerald Kief, MD  busPIRone (BUSPAR) 7.5 MG tablet Take 7.5 mg by mouth 2 (two) times daily. 1 and 1/2 tablet(s)    [provider]  Carbidopa-Levodopa ER (SINEMET CR) 25-100 MG tablet controlled release TAKE TWO TABLETS BY MOUTH THREE TIMES DAILY  Patient taking differently: Take 2 tablets by mouth in the morning, at noon, and at bedtime. Takes 2 tablets in the morning, 2 tablets in the afternoon, and 1 tablet at night 03/05/20   Tat, Rebecca S, DO  lidocaine (LIDODERM) 5 % Place 1 patch onto the skin daily. Remove & Discard patch within 12 hours or as directed by MD 02/23/20   Linwood Dibbles, MD  losartan (COZAAR) 50 MG tablet Take 50 mg by mouth daily.    [provider]  Pimavanserin Tartrate (NUPLAZID) 34 MG CAPS Take 1 capsule by mouth daily.    [provider]  polyethylene glycol (MIRALAX) packet Take 17 g by mouth daily. Patient taking differently: Take 17 g by mouth daily as needed for mild constipation. 05/08/17   Mackuen, Courteney Lyn, MD  rasagiline (AZILECT) 0.5 MG TABS tablet Take 1 mg by mouth daily.    [provider]  simvastatin (ZOCOR) 20 MG tablet Take 20 mg by mouth every evening.    [provider]  tamsulosin (FLOMAX) 0.4 MG CAPS capsule Take 0.4 mg by mouth daily.    [provider]    Physical Exam: Vitals:   01/04/21 1830 01/04/21 1932 01/04/21 1937 01/04/21 2000  BP: 135/66 (!) 141/74  134/64  Pulse: 82 95  93  Resp: (!) 24 (!) 24  (!) 24   Temp:   99.9 F (37.7 C)   TempSrc:   Oral   SpO2: 98% 97%  98%  Weight:      Height:        GENERAL: 75 y.o.-year-old male patient, well-developed, well-nourished lying in the bed in no acute distress.  Somnolent but arousable.  Pale and slightly diaphoretic.  HEENT: Head atraumatic, normocephalic. Pupils equal. Mucus membranes dry NECK: Supple. No JVD. CHEST: Normal breath sounds bilaterally. No wheezing, rales, rhonchi or crackles. No use of accessory muscles of respiration.  No reproducible chest wall tenderness.  CARDIOVASCULAR: S1, S2 normal. No murmurs, rubs, or gallops. Cap refill <2 seconds. Pulses intact distally.  ABDOMEN: Soft, nondistended, nontender. No rebound, guarding, rigidity. Normoactive bowel sounds present in all four quadrants. Foley in place draining dark brown urine EXTREMITIES: No pedal edema, cyanosis, or clubbing. No calf tenderness  or Homan's sign.  NEUROLOGIC: The patient is alert and oriented x 3. Cranial nerves II through XII are grossly intact with no focal sensorimotor deficit. PSYCHIATRIC:  Normal affect, mood, thought content. SKIN: Warm, dry, and intact without obvious rash, lesion, or ulcer.    Labs on Admission:  CBC: Recent Labs  Lab 01/04/21 1710  WBC 13.3*  NEUTROABS 10.8*  HGB 14.9  HCT 46.2  MCV 91.8  PLT 270   Basic Metabolic Panel: Recent Labs  Lab 01/04/21 1710  NA 133*  K 3.9  CL 99  CO2 21*  GLUCOSE 196*  BUN 8  CREATININE 0.93  CALCIUM 8.9   GFR: Estimated Creatinine Clearance: 93.7 mL/min (by C-G formula based on SCr of 0.93 mg/dL). Liver Function Tests: Recent Labs  Lab 01/04/21 1710  AST 138*  ALT 68*  ALKPHOS 131*  BILITOT 2.9*  PROT 7.1  ALBUMIN 3.5   Recent Labs  Lab 01/04/21 1710  LIPASE 279*   No results for input(s): AMMONIA in the last 168 hours. Coagulation Profile: No results for input(s): INR, PROTIME in the last 168 hours. Cardiac Enzymes: No results for input(s): CKTOTAL, CKMB,  CKMBINDEX, TROPONINI in the last 168 hours. BNP (last 3 results) No results for input(s): PROBNP in the last 8760 hours. HbA1C: No results for input(s): HGBA1C in the last 72 hours. CBG: Recent Labs  Lab 01/04/21 1710  GLUCAP 179*   Lipid Profile: No results for input(s): CHOL, HDL, LDLCALC, TRIG, CHOLHDL, LDLDIRECT in the last 72 hours. Thyroid Function Tests: No results for input(s): TSH, T4TOTAL, FREET4, T3FREE, THYROIDAB in the last 72 hours. Anemia Panel: No results for input(s): VITAMINB12, FOLATE, FERRITIN, TIBC, IRON, RETICCTPCT in the last 72 hours. Urine analysis:    Component Value Date/Time   COLORURINE AMBER (A) 01/04/2021 1746   APPEARANCEUR CLOUDY (A) 01/04/2021 1746   LABSPEC 1.009 01/04/2021 1746   PHURINE 7.0 01/04/2021 1746   GLUCOSEU 50 (A) 01/04/2021 1746   GLUCOSEU 100 (A) 02/06/2019 0813   HGBUR MODERATE (A) 01/04/2021 1746   BILIRUBINUR NEGATIVE 01/04/2021 1746   KETONESUR NEGATIVE 01/04/2021 1746   PROTEINUR NEGATIVE 01/04/2021 1746   UROBILINOGEN 1.0 02/06/2019 0813   NITRITE POSITIVE (A) 01/04/2021 1746   LEUKOCYTESUR LARGE (A) 01/04/2021 1746   Sepsis Labs: @LABRCNTIP (procalcitonin:4,lacticidven:4) )No results found for this or any previous visit (from the past 240 hour(s)).   Radiological Exams on Admission: CT Abdomen Pelvis W Contrast  Result Date: 01/04/2021 CLINICAL DATA:  Fever, vomiting EXAM: CT ABDOMEN AND PELVIS WITH CONTRAST TECHNIQUE: Multidetector CT imaging of the abdomen and pelvis was performed using the standard protocol following bolus administration of intravenous contrast. CONTRAST:  OMNIPAQUE IOHEXOL 300 MG/ML  SOLN COMPARISON:  11/09/2020 FINDINGS: Lower chest: Coronary artery calcifications. Main pulmonary trunk measures 3.3 cm in diameter. No basilar airspace opacity. Hepatobiliary: There are numerous gallstones within the gallbladder lumen, many of which containing gas. Gallbladder is mildly distended. No definite  pericholecystic inflammatory changes. Mild intrahepatic biliary dilatation. No focal liver lesion identified on unenhanced imaging. Pancreas: Peripancreatic fat stranding throughout the length of the pancreatic body and tail. No peripancreatic fluid collections. Spleen: Normal in size without focal abnormality. Adrenals/Urinary Tract: Unremarkable adrenal glands. Kidneys enhance symmetrically without suspicious lesion, stone, or hydronephrosis. Probable subcentimeter left renal cyst. Ureters are nondilated. Urinary bladder decompressed by Foley catheter. Small amount of fluid within known right-sided bladder diverticula. Stomach/Bowel: Stomach is within normal limits. Appendix appears normal (series 5, image 65). No evidence of bowel wall  thickening, distention, or inflammatory changes. Mild-moderate volume of stool throughout the colon. Vascular/Lymphatic: Atherosclerosis throughout the aortoiliac axis without aneurysm. IVC filter noted. No lymphadenopathy. Reproductive: Brachytherapy seeds within the prostate gland. Other: No free fluid. No abdominopelvic fluid collection. No pneumoperitoneum. No abdominal wall hernia. Musculoskeletal: No acute osseous findings. Flowing anterior endplate osteophytes throughout the thoracic and lumbar spine suggesting diffuse idiopathic skeletal hyperostosis or possibly ankylosing spondylitis. Degenerative changes of the bilateral hips. Partial ankylosis of the sacroiliac joints. IMPRESSION: 1. Findings suggestive of mild acute pancreatitis. 2. Cholelithiasis with mild gallbladder distention. No definite pericholecystic inflammatory changes. If there is clinical concern for acute cholecystitis, a right upper quadrant ultrasound could be performed for further evaluation. 3. Mild intrahepatic biliary dilatation. 4. Mild-moderate volume of stool throughout the colon. 5. Mildly dilated main pulmonary trunk suggesting pulmonary arterial hypertension. 6. Aortic atherosclerosis  (ICD10-I70.0). Electronically Signed   By: Duanne Guess D.O.   On: 01/04/2021 19:26   DG Chest Port 1 View  Result Date: 01/04/2021 CLINICAL DATA:  Fever, vomiting EXAM: PORTABLE CHEST 1 VIEW COMPARISON:  11/27/2020 FINDINGS: The heart size and mediastinal contours are within normal limits. Both lungs are clear. The visualized skeletal structures are unremarkable. IMPRESSION: No active disease. Electronically Signed   By: Sharlet Salina M.D.   On: 01/04/2021 17:59    EKG: It is tach at 113 bpm with normal axis and nonspecific ST-T wave changes.   Assessment/Plan  This is a 75 y.o. male with a history of CVA with residual L hemiparesis, GERD, DVT with IVC, HTN, HLD, Parkinson's now being admitted with:  #. Sepsis secondary to UTI - Admit to inpatient with telemetry monitoring - IV antibiotics: Rocephin - IV fluid hydration - Follow up blood,urine cultures - Repeat CBC in am.  - D/C foley and straight cath with coudet PRN.    #.  Acute pancreatitis -N.p.o. -Gentle IV fluids -Pain control -Trend LFTs -Check lipids -Check right upper quadrant abdominal ultrasound to evaluate for gallstone  #.  History of DVT -Continue Eliquis  #. History of hypertension - Continue Cozaar  #. History of hyperlipidemia - Continue Zocor  #. History of BPH - Continue Flomax  #. History of GERD - Continue Prilosec, simethicone  #. History of allergies - Continue Claritin  #.  History of Parkinson's Continue Sinemet, rasagiline, BuSpar, Nuplazid  Admission status: Inpatient, telemetry IV Fluids: Gentle normal saline Diet/Nutrition: N.p.o. Consults called: None DVT Px: Eliquis and early ambulation. Code Status: Full Code  Disposition Plan: To home in 2-3 days  All the records are reviewed and case discussed with ED provider. Management plans discussed with the patient and/or family who express understanding and agree with plan of care.  Hailly Fess D.O. on 01/04/2021 at 8:23  PM CC: Primary care physician; Farris Has, MD   01/04/2021, 8:23 PM

## 2021-01-04 NOTE — ED Provider Notes (Signed)
[facility] DEPT Provider Note   CSN: [id number] Arrival date & time: 01/04/21  1645     History Chief Complaint  Patient presents with  . Vomiting    Robert [name] is a 75 y.o. male.  HPI Patient with multiple medical issues including prior stroke with baseline left-sided hemiparesis, as well as chronic indwelling Foley catheter presents with concern of fever, p.o. intolerance, nausea, vomiting. He arrives via EMS, and those individuals assist with the history. Apparently the patient had a fever as high as 100.9 earlier today, has had difficulty with oral intake in spite of taking multiple OTC medication.  Currently patient states that he has no abdominal pain, does not feel nauseous, denies discomfort.  However, he is aware of his situation earlier in the day. EMS reports no hemodynamic instability in route, aside from artifact with blood pressure readings. He was awake, alert, throughout transport.     Past Medical History:  Diagnosis Date  . BPH (benign prostatic hyperplasia)   . CVA (cerebral vascular accident) ([place])    with left sided hemiparesis  . Diverticulosis   . Frequency of urination   . GERD (gastroesophageal reflux disease)   . Gross hematuria   . History of acute pyelonephritis    10-13-2012  . History of CVA with residual deficit    2008--  left side of body weakness and foot drop (wears leg brace and uses cane)  . History of DVT of lower extremity    2008--  cva  . Hypercholesteremia   . Hyperlipidemia   . Hyperlipidemia   . Hypertension   . 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 Left leg DVT ([place])   . 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 Urethral stricture   . Urgency of urination   . Urinary retention   . Weakness of left side of body    secondary to cva 2008  . Wears glasses   . Wears hearing aid    bilateral-- wears intermittantly    Patient Active Problem List   Diagnosis Date  Noted  . Sepsis secondary to UTI ([place]) 11/10/2020  . AKI (acute kidney injury) ([place]) 11/10/2020  . Hyperkalemia 11/10/2020  . Parkinson disease ([place]) 11/10/2020  . Hyperglycemia 11/10/2020  . Elevated LFTs 11/10/2020  . Diarrhea   . Other hydronephrosis   . History of deep vein thrombosis (DVT) of lower extremity   . Urinary retention 08/14/2019  . History of CVA with residual deficit   . BPH (benign prostatic hyperplasia)   . Orthostatic hypotension 08/13/2019  . Hyperlipidemia   . Hemiparesis affecting left side as late effect of cerebrovascular accident ([place]) 12/06/2017  . Gait abnormality 11/22/2017  . UTI (urinary tract infection) 04/22/2017  . Acute lower UTI 04/22/2017  . Tremor 04/22/2017  . Acute pyelonephritis 09/29/2012  . Nausea 09/29/2012  . Fever 09/29/2012  . HTN (hypertension) 09/29/2012  . H/O: CVA (cerebrovascular accident) 09/29/2012  . Hearing loss 09/29/2012    Past Surgical History:  Procedure Laterality Date  . CYSTOSCOPY WITH RETROGRADE URETHROGRAM N/A 10/23/2015   Procedure: CYSTOSCOPY WITH RETROGRADE URETHROGRAM;  Surgeon: [name], MD;  Location: [facility];  Service: Urology;  Laterality: N/A;  . CYSTOSCOPY WITH URETHRAL DILATATION N/A 10/23/2015   Procedure: CYSTOSCOPY WITH URETHRAL BALLOON DILATATION;  Surgeon: [name], MD;  Location: [facility];  Service: Urology;  Laterality: N/A;  BALLOON DILATION   .  IVC FILTER PLACEMENT ([place] [facility])  2008       Family History  Problem Relation Age of Onset  . Diabetes Sister   . Lung cancer Brother        Post 9/11 voluntary work in [place]  . Prostate cancer Brother   . Other Mother        passed away young- non medical  . Alzheimer's disease Father   . Healthy Son     Social History   Tobacco Use  . Smoking status: Former Smoker    Years: 10.00    Types: Cigarettes    Quit date: 10/26/1970    Years since quitting: 50.2  . Smokeless tobacco: Never Used   Vaping Use  . Vaping Use: Never used  Substance Use Topics  . Alcohol use: No  . Drug use: No    Home Medications Prior to Admission medications   Medication Sig Start Date End Date Taking? Authorizing Provider  acetaminophen (TYLENOL) 500 MG tablet Take 1,000 mg by mouth every 6 (six) hours as needed (shoulder pain).    [provider]  apixaban (ELIQUIS) 5 MG TABS tablet Take 1 tablet (5 mg total) by mouth 2 (two) times daily. 08/22/19 02/23/20  Donne [place], MD  busPIRone (BUSPAR) 7.5 MG tablet Take 7.5 mg by mouth 2 (two) times daily. 1 and 1/2 tablet(s)    [provider]  Carbidopa-Levodopa ER (SINEMET CR) 25-100 MG tablet controlled release TAKE TWO TABLETS BY MOUTH THREE TIMES DAILY  Patient taking differently: Take 2 tablets by mouth in the morning, at noon, and at bedtime. Takes 2 tablets in the morning, 2 tablets in the afternoon, and 1 tablet at night 03/05/20   Tat, Rebecca S, DO  lidocaine (LIDODERM) 5 % Place 1 patch onto the skin daily. Remove & Discard patch within 12 hours or as directed by MD 02/23/20   Dorie Rank, MD  losartan (COZAAR) 50 MG tablet Take 50 mg by mouth daily.    [provider]  Pimavanserin Tartrate (NUPLAZID) 34 MG CAPS Take 1 capsule by mouth daily.    [provider]  polyethylene glycol (MIRALAX) packet Take 17 g by mouth daily. Patient taking differently: Take 17 g by mouth daily as needed for mild constipation. 05/08/17   Mackuen, Courteney Lyn, MD  rasagiline (AZILECT) 0.5 MG TABS tablet Take 1 mg by mouth daily.    [provider]  simvastatin (ZOCOR) 20 MG tablet Take 20 mg by mouth every evening.    [provider]  tamsulosin (FLOMAX) 0.4 MG CAPS capsule Take 0.4 mg by mouth daily.    [provider]    Allergies    Propofol  Review of Systems   Review of Systems  Constitutional:       Per HPI, otherwise negative  HENT:       Per HPI, otherwise negative  Respiratory:        Per HPI, otherwise negative  Cardiovascular:       Per HPI, otherwise negative  Gastrointestinal: Positive for nausea and vomiting.  Endocrine:       Negative aside from HPI  Genitourinary:       Neg aside from HPI   Musculoskeletal:       Per HPI, otherwise negative  Skin: Negative.   Neurological: Positive for weakness. Negative for syncope.    Physical Exam Updated Vital Signs BP 134/64   Pulse 93   Temp 99.9 F (37.7 C) (Oral)  Resp (!) 24   Ht 6\' 2"  (1.88 m)   Wt 110.7 kg   SpO2 98%   BMI 31.33 kg/m   Physical Exam Vitals and nursing note reviewed.  Constitutional:      General: He is not in acute distress.    Appearance: He is well-developed. He is obese. He is not ill-appearing or diaphoretic.  HENT:     Head: Normocephalic and atraumatic.  Eyes:     Conjunctiva/sclera: Conjunctivae normal.  Cardiovascular:     Rate and Rhythm: Normal rate and regular rhythm.  Pulmonary:     Effort: Pulmonary effort is normal. No respiratory distress.     Breath sounds: No stridor.  Abdominal:     General: There is no distension.     Tenderness: There is no abdominal tenderness. There is no guarding.     Comments: Protuberant, but not tender abdomen.  Skin:    General: Skin is warm and dry.  Neurological:     Mental Status: He is alert and oriented to person, place, and time.     Comments: Left-sided hemiparesis      ED Results / Procedures / Treatments   Labs (all labs ordered are listed, but only abnormal results are displayed) Labs Reviewed  COMPREHENSIVE METABOLIC PANEL - Abnormal; Notable for the following components:      Result Value   Sodium 133 (*)    CO2 21 (*)    Glucose, Bld 196 (*)    AST 138 (*)    ALT 68 (*)    Alkaline Phosphatase 131 (*)    Total Bilirubin 2.9 (*)    All other components within normal limits  LIPASE, BLOOD - Abnormal; Notable for the following components:   Lipase 279 (*)    All other components within normal limits  CBC  WITH DIFFERENTIAL/PLATELET - Abnormal; Notable for the following components:   WBC 13.3 (*)    Neutro Abs 10.8 (*)    Lymphs Abs 0.6 (*)    Monocytes Absolute 1.8 (*)    All other components within normal limits  URINALYSIS, ROUTINE W REFLEX MICROSCOPIC - Abnormal; Notable for the following components:   Color, Urine AMBER (*)    APPearance CLOUDY (*)    Glucose, UA 50 (*)    Hgb urine dipstick MODERATE (*)    Nitrite POSITIVE (*)    Leukocytes,Ua LARGE (*)    WBC, UA >50 (*)    Bacteria, UA MANY (*)    Non Squamous Epithelial 0-5 (*)    All other components within normal limits  BRAIN NATRIURETIC PEPTIDE - Abnormal; Notable for the following components:   B Natriuretic Peptide 111.1 (*)    All other components within normal limits  CBG MONITORING, ED - Abnormal; Notable for the following components:   Glucose-Capillary 179 (*)    All other components within normal limits  SARS CORONAVIRUS 2 (TAT 6-24 HRS)    EKG None EMS rhythm strip with sinus tach, 113, abnormal Pulse oximetry 98% room air normal  Radiology CT Abdomen Pelvis W Contrast  Result Date: 01/04/2021 CLINICAL DATA:  Fever, vomiting EXAM: CT ABDOMEN AND PELVIS WITH CONTRAST TECHNIQUE: Multidetector CT imaging of the abdomen and pelvis was performed using the standard protocol following bolus administration of intravenous contrast. CONTRAST:  110mL OMNIPAQUE IOHEXOL 300 MG/ML  SOLN COMPARISON:  11/09/2020 FINDINGS: Lower chest: Coronary artery calcifications. Main pulmonary trunk measures 3.3 cm in diameter. No basilar airspace opacity. Hepatobiliary: There are numerous gallstones within the  gallbladder lumen, many of which containing gas. Gallbladder is mildly distended. No definite pericholecystic inflammatory changes. Mild intrahepatic biliary dilatation. No focal liver lesion identified on unenhanced imaging. Pancreas: Peripancreatic fat stranding throughout the length of the pancreatic body and tail. No  peripancreatic fluid collections. Spleen: Normal in size without focal abnormality. Adrenals/Urinary Tract: Unremarkable adrenal glands. Kidneys enhance symmetrically without suspicious lesion, stone, or hydronephrosis. Probable subcentimeter left renal cyst. Ureters are nondilated. Urinary bladder decompressed by Foley catheter. Small amount of fluid within known right-sided bladder diverticula. Stomach/Bowel: Stomach is within normal limits. Appendix appears normal (series 5, image 65). No evidence of bowel wall thickening, distention, or inflammatory changes. Mild-moderate volume of stool throughout the colon. Vascular/Lymphatic: Atherosclerosis throughout the aortoiliac axis without aneurysm. IVC filter noted. No lymphadenopathy. Reproductive: Brachytherapy seeds within the prostate gland. Other: No free fluid. No abdominopelvic fluid collection. No pneumoperitoneum. No abdominal wall hernia. Musculoskeletal: No acute osseous findings. Flowing anterior endplate osteophytes throughout the thoracic and lumbar spine suggesting diffuse idiopathic skeletal hyperostosis or possibly ankylosing spondylitis. Degenerative changes of the bilateral hips. Partial ankylosis of the sacroiliac joints. IMPRESSION: 1. Findings suggestive of mild acute pancreatitis. 2. Cholelithiasis with mild gallbladder distention. No definite pericholecystic inflammatory changes. If there is clinical concern for acute cholecystitis, a right upper quadrant ultrasound could be performed for further evaluation. 3. Mild intrahepatic biliary dilatation. 4. Mild-moderate volume of stool throughout the colon. 5. Mildly dilated main pulmonary trunk suggesting pulmonary arterial hypertension. 6. Aortic atherosclerosis (ICD10-I70.0). Electronically Signed   By: Davina Poke D.O.   On: 01/04/2021 19:26   DG Chest Port 1 View  Result Date: 01/04/2021 CLINICAL DATA:  Fever, vomiting EXAM: PORTABLE CHEST 1 VIEW COMPARISON:  11/27/2020 FINDINGS: The  heart size and mediastinal contours are within normal limits. Both lungs are clear. The visualized skeletal structures are unremarkable. IMPRESSION: No active disease. Electronically Signed   By: Randa Ngo M.D.   On: 01/04/2021 17:59    Procedures Procedures   Medications Ordered in ED Medications  ondansetron (ZOFRAN) injection 4 mg (4 mg Intravenous Given 01/04/21 1742)  iohexol (OMNIPAQUE) 300 MG/ML solution 100 mL (100 mLs Intravenous Contrast Given 01/04/21 1855)  cefTRIAXone (ROCEPHIN) 1 g in sodium chloride 0.9 % 100 mL IVPB (1 g Intravenous New Bag/Given 01/04/21 1952)  acetaminophen (TYLENOL) tablet 650 mg (650 mg Oral Given 01/04/21 1954)    ED Course  I have reviewed the triage vital signs and the nursing notes.  Pertinent labs & imaging results that were available during my care of the patient were reviewed by me and considered in my medical decision making (see chart for details).   On repeat exam the patient states that he feels better, is interested in going home.  However, I discussed findings with him and his wife.  Presentation concerning for SIRS, likely secondary to acute pancreatitis, urinary tract infection. Patient's labs, CT scan reviewed, antibiotic started, mild fluids started given the patient's history of heart failure. Patient CT scan in particular reviewed, notable for acute pancreatitis, with some gallstones.  Discussed patient case with internal medicine colleagues for admission, consideration of ongoing studies, including ultrasound as needed given his presentation concerning for SIRS as above. MDM Rules/Calculators/A&P MDM Number of Diagnoses or Management Options Idiopathic acute pancreatitis without infection or necrosis: new, needed workup SIRS (systemic inflammatory response syndrome) ([place]): new, needed workup Urinary tract infection associated with indwelling urethral catheter, initial encounter [facility]): new, needed workup   Amount and/or Complexity  of Data Reviewed Clinical lab  tests: reviewed Tests in the radiology section of CPT: reviewed Tests in the medicine section of CPT: reviewed Decide to obtain previous medical records or to obtain history from someone other than the patient: yes Obtain history from someone other than the patient: yes Review and summarize past medical records: yes Discuss the patient with other providers: yes Independent visualization of images, tracings, or specimens: yes  Risk of Complications, Morbidity, and/or Mortality Presenting problems: high Diagnostic procedures: high Management options: high  Critical Care Total time providing critical care: 30-74 minutes (35)  Patient Progress Patient progress: stable  Final Clinical Impression(s) / ED Diagnoses Final diagnoses:  SIRS (systemic inflammatory response syndrome) ([place])  Idiopathic acute pancreatitis without infection or necrosis  Urinary tract infection associated with indwelling urethral catheter, initial encounter ([place])     Carmin Muskrat, MD 01/04/21 2027

## 2021-01-04 NOTE — ED Triage Notes (Signed)
Ems called out for vomiting and not being able to keep anything down. Family reports fever.

## 2021-01-04 NOTE — H&P (Incomplete)
History and Physical   [facility] - [place] @ [place] Admission History and Physical [employer], D.O.    Patient Name: Robert [name] MR#: [id number] Date of Birth: 02/12/1946 Date of Admission: 01/04/2021  Referring MD/NP/PA: Dr. Vanita Panda Primary Care Physician: [name], MD  Chief Complaint:  Chief Complaint  Patient presents with  . Vomiting    HPI: Robert [name] is a 75 y.o. 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  Patient was in a usual state of health until today when he developed fever to T-max of 100.9, vomiting with p.o. intolerance, weakness.  Patient denies dizziness, chest pain, shortness of breath, N/V/C/D, abdominal pain, dysuria/frequency, changes in mental status.    Otherwise there has been no change in status. Patient has been taking medication as prescribed and there has been no recent change in medication or diet.  No recent antibiotics.  There has been no recent illness, hospitalizations, travel or sick contacts.    EMS/ED Course: Patient received Rocephin, Tylenol, Zofran, normal saline. Medical admission has been requested for further management of sepsis secondary to urinary tract infection.  Review of Systems:  CONSTITUTIONAL: Positive fever/chills, fatigue, weakness, negative weight gain/loss, headache. EYES: No blurry or double vision. ENT: No tinnitus, postnasal drip, redness or soreness of the oropharynx. RESPIRATORY: No cough, dyspnea, wheeze.  No hemoptysis.  CARDIOVASCULAR: No chest pain, palpitations, syncope, orthopnea. No lower extremity edema.  GASTROINTESTINAL: Positive nausea, vomiting, abdominal pain, diarrhea, constipation.  No hematemesis, melena or hematochezia. GENITOURINARY: No dysuria, frequency, hematuria. ENDOCRINE: No polyuria or nocturia. No  heat or cold intolerance. HEMATOLOGY: No anemia, bruising, bleeding. INTEGUMENTARY: No rashes, ulcers, lesions. MUSCULOSKELETAL: No arthritis, gout. NEUROLOGIC: No numbness, tingling, ataxia, seizure-type activity. PSYCHIATRIC: No anxiety, depression, insomnia.   Past Medical History:  Diagnosis Date  . BPH (benign prostatic hyperplasia)   . CVA (cerebral vascular accident) ([place])    with left sided hemiparesis  . Diverticulosis   . Frequency of urination   . GERD (gastroesophageal reflux disease)   . Gross hematuria   . History of acute pyelonephritis    10-13-2012  . History of CVA with residual deficit    2008--  left side of body weakness and foot drop (wears leg brace and uses cane)  . History of DVT of lower extremity    2008--  cva  . Hypercholesteremia   . Hyperlipidemia   . Hyperlipidemia   . Hypertension   . 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 Left leg DVT ([place])   . 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 Urethral stricture   . Urgency of urination   . Urinary retention   . Weakness of left side of body    secondary to cva 2008  . Wears glasses   . Wears hearing aid    bilateral-- wears intermittantly    Past Surgical History:  Procedure Laterality Date  . CYSTOSCOPY WITH RETROGRADE URETHROGRAM N/A 10/23/2015   Procedure: CYSTOSCOPY WITH RETROGRADE URETHROGRAM;  Surgeon: [name], MD;  Location: [facility];  Service: Urology;  Laterality: N/A;  . CYSTOSCOPY WITH URETHRAL DILATATION N/A 10/23/2015   Procedure: CYSTOSCOPY WITH URETHRAL BALLOON DILATATION;  Surgeon: [name], MD;  Location: [facility];  Service: Urology;  Laterality: N/A;  BALLOON DILATION   . IVC FILTER PLACEMENT ([place] [facility])  2008     reports that he quit smoking about 50 years ago. His smoking use included cigarettes. He quit after 10.00 years of use. He has never used smokeless tobacco. He reports that he does not drink  alcohol and does not use drugs.  Allergies  Allergen Reactions  . Propofol Other (See Comments)    "hiccups for weeks"    Family History  Problem Relation Age of Onset  . Diabetes Sister   . Lung cancer Brother        Post 9/11 voluntary work in [place]  . Prostate cancer Brother   . Other Mother        passed away young- non medical  . Alzheimer's disease Father   . Healthy Son     Prior to Admission medications   Medication Sig Start Date End Date Taking? Authorizing Provider  acetaminophen (TYLENOL) 500 MG tablet Take 1,000 mg by mouth every 6 (six) hours as needed (shoulder pain).    [provider]  apixaban (ELIQUIS) 5 MG TABS tablet Take 1 tablet (5 mg total) by mouth 2 (two) times daily. 08/22/19 02/23/20  Donne Hazel, MD  busPIRone (BUSPAR) 7.5 MG tablet Take 7.5 mg by mouth 2 (two) times daily. 1 and 1/2 tablet(s)    [provider]  Carbidopa-Levodopa ER (SINEMET CR) 25-100 MG tablet controlled release TAKE TWO TABLETS BY MOUTH THREE TIMES DAILY  Patient taking differently: Take 2 tablets by mouth in the morning, at noon, and at bedtime. Takes 2 tablets in the morning, 2 tablets in the afternoon, and 1 tablet at night 03/05/20   Tat, Rebecca S, DO  lidocaine (LIDODERM) 5 % Place 1 patch onto the skin daily. Remove & Discard patch within 12 hours or as directed by MD 02/23/20   Dorie Rank, MD  losartan (COZAAR) 50 MG tablet Take 50 mg by mouth daily.    [provider]  Pimavanserin Tartrate (NUPLAZID) 34 MG CAPS Take 1 capsule by mouth daily.    [provider]  polyethylene glycol (MIRALAX) packet Take 17 g by mouth daily. Patient taking differently: Take 17 g by mouth daily as needed for mild constipation. 05/08/17   Mackuen, Courteney Lyn, MD  rasagiline (AZILECT) 0.5 MG TABS tablet Take 1 mg by mouth daily.    [provider]  simvastatin (ZOCOR) 20 MG tablet Take 20 mg by mouth every evening.    [provider]   tamsulosin (FLOMAX) 0.4 MG CAPS capsule Take 0.4 mg by mouth daily.    [provider]    Physical Exam: Vitals:   01/04/21 1830 01/04/21 1932 01/04/21 1937 01/04/21 2000  BP: 135/66 (!) 141/74  134/64  Pulse: 82 95  93  Resp: (!) 24 (!) 24  (!) 24  Temp:   99.9 F (37.7 C)   TempSrc:   Oral   SpO2: 98% 97%  98%  Weight:      Height:        GENERAL: 75 y.o.-year-old *** patient, well-developed, well-nourished lying in the bed in no acute distress.  Pleasant and cooperative.   HEENT: Head atraumatic, normocephalic. Pupils equal. Mucus membranes moist. NECK: Supple. No JVD. CHEST: Normal breath sounds bilaterally. No wheezing, rales, rhonchi or crackles. No use of accessory muscles of respiration.  No reproducible chest wall tenderness.  CARDIOVASCULAR: S1, S2 normal. No murmurs, rubs, or gallops. Cap refill <2 seconds. Pulses intact distally.  ABDOMEN: Soft, nondistended, nontender. No rebound, guarding, rigidity. Normoactive bowel sounds present in all four quadrants.  EXTREMITIES: No pedal edema, cyanosis, or clubbing. No calf tenderness or Homan's sign.  NEUROLOGIC: The patient is alert and oriented x 3. Cranial nerves II through XII are grossly intact with no focal sensorimotor deficit. PSYCHIATRIC:  Normal affect, mood, thought content. SKIN: Warm, dry, and intact without obvious rash, lesion, or ulcer.    Labs on Admission:  CBC: Recent Labs  Lab 01/04/21 1710  WBC 13.3*  NEUTROABS 10.8*  HGB 14.9  HCT 46.2  MCV 91.8  PLT 379   Basic Metabolic Panel: Recent Labs  Lab 01/04/21 1710  NA 133*  K 3.9  CL 99  CO2 21*  GLUCOSE 196*  BUN 8  CREATININE 0.93  CALCIUM 8.9   GFR: Estimated Creatinine Clearance: 93.7 mL/min (by C-G formula based on SCr of 0.93 mg/dL). Liver Function Tests: Recent Labs  Lab 01/04/21 1710  AST 138*  ALT 68*  ALKPHOS 131*  BILITOT 2.9*  PROT 7.1  ALBUMIN 3.5   Recent Labs  Lab 01/04/21 1710  LIPASE 279*   No  results for input(s): AMMONIA in the last 168 hours. Coagulation Profile: No results for input(s): INR, PROTIME in the last 168 hours. Cardiac Enzymes: No results for input(s): CKTOTAL, CKMB, CKMBINDEX, TROPONINI in the last 168 hours. BNP (last 3 results) No results for input(s): PROBNP in the last 8760 hours. HbA1C: No results for input(s): HGBA1C in the last 72 hours. CBG: Recent Labs  Lab 01/04/21 1710  GLUCAP 179*   Lipid Profile: No results for input(s): CHOL, HDL, LDLCALC, TRIG, CHOLHDL, LDLDIRECT in the last 72 hours. Thyroid Function Tests: No results for input(s): TSH, T4TOTAL, FREET4, T3FREE, THYROIDAB in the last 72 hours. Anemia Panel: No results for input(s): VITAMINB12, FOLATE, FERRITIN, TIBC, IRON, RETICCTPCT in the last 72 hours. Urine analysis:    Component Value Date/Time   COLORURINE AMBER (A) 01/04/2021 1746   APPEARANCEUR CLOUDY (A) 01/04/2021 1746   LABSPEC 1.009 01/04/2021 1746   PHURINE 7.0 01/04/2021 1746   GLUCOSEU 50 (A) 01/04/2021 1746   GLUCOSEU 100 (A) 02/06/2019 0813   HGBUR MODERATE (A) 01/04/2021 1746   BILIRUBINUR NEGATIVE 01/04/2021 1746   KETONESUR NEGATIVE 01/04/2021 1746   PROTEINUR NEGATIVE 01/04/2021 1746   UROBILINOGEN 1.0 02/06/2019 0813   NITRITE POSITIVE (A) 01/04/2021 1746   LEUKOCYTESUR LARGE (A) 01/04/2021 1746   Sepsis Labs: @LABRCNTIP (procalcitonin:4,lacticidven:4) )No results found for this or any previous visit (from the past 240 hour(s)).   Radiological Exams on Admission: CT Abdomen Pelvis W Contrast  Result Date: 01/04/2021 CLINICAL DATA:  Fever, vomiting EXAM: CT ABDOMEN AND PELVIS WITH CONTRAST TECHNIQUE: Multidetector CT imaging of the abdomen and pelvis was performed using the standard protocol following bolus administration of intravenous contrast. CONTRAST:  183mL OMNIPAQUE IOHEXOL 300 MG/ML  SOLN COMPARISON:  11/09/2020 FINDINGS: Lower chest: Coronary artery calcifications. Main pulmonary trunk measures 3.3 cm  in diameter. No basilar airspace opacity. Hepatobiliary: There are numerous gallstones within the gallbladder lumen, many of which containing gas. Gallbladder is mildly distended. No definite pericholecystic inflammatory changes. Mild intrahepatic biliary dilatation. No focal liver lesion identified on unenhanced imaging. Pancreas: Peripancreatic fat stranding throughout the length of the pancreatic body and tail. No peripancreatic fluid collections. Spleen: Normal in size without focal abnormality. Adrenals/Urinary Tract: Unremarkable adrenal glands. Kidneys enhance symmetrically without suspicious lesion, stone, or hydronephrosis. Probable subcentimeter left renal cyst. Ureters are nondilated. Urinary bladder decompressed by Foley catheter.  Small amount of fluid within known right-sided bladder diverticula. Stomach/Bowel: Stomach is within normal limits. Appendix appears normal (series 5, image 65). No evidence of bowel wall thickening, distention, or inflammatory changes. Mild-moderate volume of stool throughout the colon. Vascular/Lymphatic: Atherosclerosis throughout the aortoiliac axis without aneurysm. IVC filter noted. No lymphadenopathy. Reproductive: Brachytherapy seeds within the prostate gland. Other: No free fluid. No abdominopelvic fluid collection. No pneumoperitoneum. No abdominal wall hernia. Musculoskeletal: No acute osseous findings. Flowing anterior endplate osteophytes throughout the thoracic and lumbar spine suggesting diffuse idiopathic skeletal hyperostosis or possibly ankylosing spondylitis. Degenerative changes of the bilateral hips. Partial ankylosis of the sacroiliac joints. IMPRESSION: 1. Findings suggestive of mild acute pancreatitis. 2. Cholelithiasis with mild gallbladder distention. No definite pericholecystic inflammatory changes. If there is clinical concern for acute cholecystitis, a right upper quadrant ultrasound could be performed for further evaluation. 3. Mild intrahepatic  biliary dilatation. 4. Mild-moderate volume of stool throughout the colon. 5. Mildly dilated main pulmonary trunk suggesting pulmonary arterial hypertension. 6. Aortic atherosclerosis (ICD10-I70.0). Electronically Signed   By: Davina Poke D.O.   On: 01/04/2021 19:26   DG Chest Port 1 View  Result Date: 01/04/2021 CLINICAL DATA:  Fever, vomiting EXAM: PORTABLE CHEST 1 VIEW COMPARISON:  11/27/2020 FINDINGS: The heart size and mediastinal contours are within normal limits. Both lungs are clear. The visualized skeletal structures are unremarkable. IMPRESSION: No active disease. Electronically Signed   By: Randa Ngo M.D.   On: 01/04/2021 17:59    EKG: It is tach at 113 bpm with normal axis and nonspecific ST-T wave changes.   Assessment/Plan  This is a 75 y.o. male with a history of CVA with residual L hemiparesis, GERD, DVT with IVC, HTN, HLD, Parkinson's now being admitted with:  #. Sepsis secondary to UTI - Admit to inpatient with telemetry monitoring - IV antibiotics: Rocephin - IV fluid hydration - Follow up blood,urine & sputum cultures - Repeat CBC in am.   #.  Acute pancreatitis -N.p.o. -Gentle IV fluids -Pain control -Trend LFTs -Check lipids -Check right upper quadrant abdominal ultrasound to evaluate for gallstone  #.  History of DVT -Continue Eliquis  #. History of hypertension - Continue Cozaar  #. History of hyperlipidemia - Continue Zocor  #. History of BPH - Continue Flomax  #. History of GERD - Continue Prilosec, simethicone  #. History of allergies - Continue Claritin  #.  History of Parkinson's Continue Sinemet, rasagiline, BuSpar, Nuplazid  Admission status: Inpatient, telemetry IV Fluids: Gentle normal saline Diet/Nutrition: N.p.o. Consults called: None DVT Px: Eliquis and early ambulation. Code Status: Full Code  Disposition Plan: To home in 2-3 days  All the records are reviewed and case discussed with ED provider. Management plans  discussed with the patient and/or family who express understanding and agree with plan of care.  Alexis Hugelmeyer D.O. on 01/04/2021 at 8:23 PM CC: Primary care physician; [name], MD   01/04/2021, 8:23 PM

## 2021-01-05 ENCOUNTER — Encounter (HOSPITAL_COMMUNITY): Payer: Self-pay | Admitting: Family Medicine

## 2021-01-05 ENCOUNTER — Other Ambulatory Visit: Payer: Self-pay

## 2021-01-05 DIAGNOSIS — K8020 Calculus of gallbladder without cholecystitis without obstruction: Secondary | ICD-10-CM | POA: Diagnosis not present

## 2021-01-05 DIAGNOSIS — T83511A Infection and inflammatory reaction due to indwelling urethral catheter, initial encounter: Secondary | ICD-10-CM | POA: Diagnosis present

## 2021-01-05 DIAGNOSIS — Z8042 Family history of malignant neoplasm of prostate: Secondary | ICD-10-CM | POA: Diagnosis not present

## 2021-01-05 DIAGNOSIS — L89152 Pressure ulcer of sacral region, stage 2: Secondary | ICD-10-CM | POA: Diagnosis present

## 2021-01-05 DIAGNOSIS — Z01818 Encounter for other preprocedural examination: Secondary | ICD-10-CM | POA: Diagnosis not present

## 2021-01-05 DIAGNOSIS — M6281 Muscle weakness (generalized): Secondary | ICD-10-CM | POA: Diagnosis not present

## 2021-01-05 DIAGNOSIS — Z833 Family history of diabetes mellitus: Secondary | ICD-10-CM | POA: Diagnosis not present

## 2021-01-05 DIAGNOSIS — E875 Hyperkalemia: Secondary | ICD-10-CM | POA: Diagnosis not present

## 2021-01-05 DIAGNOSIS — E785 Hyperlipidemia, unspecified: Secondary | ICD-10-CM | POA: Diagnosis present

## 2021-01-05 DIAGNOSIS — R21 Rash and other nonspecific skin eruption: Secondary | ICD-10-CM | POA: Diagnosis present

## 2021-01-05 DIAGNOSIS — Z515 Encounter for palliative care: Secondary | ICD-10-CM | POA: Diagnosis not present

## 2021-01-05 DIAGNOSIS — I10 Essential (primary) hypertension: Secondary | ICD-10-CM | POA: Diagnosis present

## 2021-01-05 DIAGNOSIS — I6930 Unspecified sequelae of cerebral infarction: Secondary | ICD-10-CM | POA: Diagnosis not present

## 2021-01-05 DIAGNOSIS — R2689 Other abnormalities of gait and mobility: Secondary | ICD-10-CM | POA: Diagnosis not present

## 2021-01-05 DIAGNOSIS — K8010 Calculus of gallbladder with chronic cholecystitis without obstruction: Secondary | ICD-10-CM | POA: Diagnosis present

## 2021-01-05 DIAGNOSIS — R41841 Cognitive communication deficit: Secondary | ICD-10-CM | POA: Diagnosis not present

## 2021-01-05 DIAGNOSIS — R531 Weakness: Secondary | ICD-10-CM | POA: Diagnosis not present

## 2021-01-05 DIAGNOSIS — K851 Biliary acute pancreatitis without necrosis or infection: Secondary | ICD-10-CM | POA: Diagnosis present

## 2021-01-05 DIAGNOSIS — E871 Hypo-osmolality and hyponatremia: Secondary | ICD-10-CM | POA: Diagnosis not present

## 2021-01-05 DIAGNOSIS — Z95828 Presence of other vascular implants and grafts: Secondary | ICD-10-CM | POA: Diagnosis not present

## 2021-01-05 DIAGNOSIS — I2510 Atherosclerotic heart disease of native coronary artery without angina pectoris: Secondary | ICD-10-CM | POA: Diagnosis not present

## 2021-01-05 DIAGNOSIS — A419 Sepsis, unspecified organism: Secondary | ICD-10-CM | POA: Diagnosis present

## 2021-01-05 DIAGNOSIS — Z48815 Encounter for surgical aftercare following surgery on the digestive system: Secondary | ICD-10-CM | POA: Diagnosis not present

## 2021-01-05 DIAGNOSIS — Z7401 Bed confinement status: Secondary | ICD-10-CM | POA: Diagnosis not present

## 2021-01-05 DIAGNOSIS — Z801 Family history of malignant neoplasm of trachea, bronchus and lung: Secondary | ICD-10-CM | POA: Diagnosis not present

## 2021-01-05 DIAGNOSIS — K859 Acute pancreatitis without necrosis or infection, unspecified: Secondary | ICD-10-CM | POA: Diagnosis not present

## 2021-01-05 DIAGNOSIS — Z01810 Encounter for preprocedural cardiovascular examination: Secondary | ICD-10-CM | POA: Diagnosis not present

## 2021-01-05 DIAGNOSIS — Z7189 Other specified counseling: Secondary | ICD-10-CM | POA: Diagnosis not present

## 2021-01-05 DIAGNOSIS — K219 Gastro-esophageal reflux disease without esophagitis: Secondary | ICD-10-CM | POA: Diagnosis present

## 2021-01-05 DIAGNOSIS — Z20822 Contact with and (suspected) exposure to covid-19: Secondary | ICD-10-CM | POA: Diagnosis present

## 2021-01-05 DIAGNOSIS — M2550 Pain in unspecified joint: Secondary | ICD-10-CM | POA: Diagnosis not present

## 2021-01-05 DIAGNOSIS — Z7901 Long term (current) use of anticoagulants: Secondary | ICD-10-CM | POA: Diagnosis not present

## 2021-01-05 DIAGNOSIS — Z978 Presence of other specified devices: Secondary | ICD-10-CM | POA: Diagnosis not present

## 2021-01-05 DIAGNOSIS — Z86718 Personal history of other venous thrombosis and embolism: Secondary | ICD-10-CM | POA: Diagnosis not present

## 2021-01-05 DIAGNOSIS — R1312 Dysphagia, oropharyngeal phase: Secondary | ICD-10-CM | POA: Diagnosis not present

## 2021-01-05 DIAGNOSIS — I69354 Hemiplegia and hemiparesis following cerebral infarction affecting left non-dominant side: Secondary | ICD-10-CM | POA: Diagnosis not present

## 2021-01-05 DIAGNOSIS — L8990 Pressure ulcer of unspecified site, unspecified stage: Secondary | ICD-10-CM | POA: Insufficient documentation

## 2021-01-05 DIAGNOSIS — N400 Enlarged prostate without lower urinary tract symptoms: Secondary | ICD-10-CM | POA: Diagnosis present

## 2021-01-05 DIAGNOSIS — N319 Neuromuscular dysfunction of bladder, unspecified: Secondary | ICD-10-CM | POA: Diagnosis not present

## 2021-01-05 DIAGNOSIS — G20 Parkinson's disease: Secondary | ICD-10-CM | POA: Diagnosis present

## 2021-01-05 DIAGNOSIS — R1084 Generalized abdominal pain: Secondary | ICD-10-CM | POA: Diagnosis not present

## 2021-01-05 DIAGNOSIS — Y846 Urinary catheterization as the cause of abnormal reaction of the patient, or of later complication, without mention of misadventure at the time of the procedure: Secondary | ICD-10-CM | POA: Diagnosis present

## 2021-01-05 DIAGNOSIS — Z820 Family history of epilepsy and other diseases of the nervous system: Secondary | ICD-10-CM | POA: Diagnosis not present

## 2021-01-05 DIAGNOSIS — M6259 Muscle wasting and atrophy, not elsewhere classified, multiple sites: Secondary | ICD-10-CM | POA: Diagnosis not present

## 2021-01-05 DIAGNOSIS — Z8673 Personal history of transient ischemic attack (TIA), and cerebral infarction without residual deficits: Secondary | ICD-10-CM | POA: Diagnosis not present

## 2021-01-05 DIAGNOSIS — Z419 Encounter for procedure for purposes other than remedying health state, unspecified: Secondary | ICD-10-CM | POA: Diagnosis not present

## 2021-01-05 DIAGNOSIS — Z885 Allergy status to narcotic agent status: Secondary | ICD-10-CM | POA: Diagnosis not present

## 2021-01-05 DIAGNOSIS — N390 Urinary tract infection, site not specified: Secondary | ICD-10-CM | POA: Diagnosis present

## 2021-01-05 DIAGNOSIS — Z888 Allergy status to other drugs, medicaments and biological substances status: Secondary | ICD-10-CM | POA: Diagnosis not present

## 2021-01-05 LAB — COMPREHENSIVE METABOLIC PANEL
ALT: 42 U/L (ref 0–44)
AST: 78 U/L — ABNORMAL HIGH (ref 15–41)
Albumin: 3.3 g/dL — ABNORMAL LOW (ref 3.5–5.0)
Alkaline Phosphatase: 118 U/L (ref 38–126)
Anion gap: 8 (ref 5–15)
BUN: 8 mg/dL (ref 8–23)
CO2: 24 mmol/L (ref 22–32)
Calcium: 8.7 mg/dL — ABNORMAL LOW (ref 8.9–10.3)
Chloride: 101 mmol/L (ref 98–111)
Creatinine, Ser: 0.91 mg/dL (ref 0.61–1.24)
GFR, Estimated: >60 mL/min (ref >60)
Glucose, Bld: 159 mg/dL — ABNORMAL HIGH (ref 70–99)
Potassium: 4 mmol/L (ref 3.5–5.1)
Sodium: 133 mmol/L — ABNORMAL LOW (ref 135–145)
Total Bilirubin: 2.2 mg/dL — ABNORMAL HIGH (ref 0.3–1.2)
Total Protein: 6.8 g/dL (ref 6.5–8.1)

## 2021-01-05 LAB — PROTIME-INR
INR: 1.6 — ABNORMAL HIGH (ref 0.8–1.2)
Prothrombin Time: 18 seconds — ABNORMAL HIGH (ref 11.4–15.2)

## 2021-01-05 LAB — SARS CORONAVIRUS 2 (TAT 6-24 HRS): SARS Coronavirus 2: NEGATIVE

## 2021-01-05 LAB — CBC
HCT: 45 % (ref 39.0–52.0)
Hemoglobin: 14.6 g/dL (ref 13.0–17.0)
MCH: 29.3 pg (ref 26.0–34.0)
MCHC: 32.4 g/dL (ref 30.0–36.0)
MCV: 90.4 fL (ref 80.0–100.0)
Platelets: 280 10*3/uL (ref 150–400)
RBC: 4.98 MIL/uL (ref 4.22–5.81)
RDW: 13.9 % (ref 11.5–15.5)
WBC: 17.9 10*3/uL — ABNORMAL HIGH (ref 4.0–10.5)
nRBC: 0 % (ref 0.0–0.2)

## 2021-01-05 LAB — LACTIC ACID, PLASMA
Lactic Acid, Venous: 1 mmol/L (ref 0.5–1.9)
Lactic Acid, Venous: 1 mmol/L (ref 0.5–1.9)

## 2021-01-05 LAB — LIPID PANEL
Cholesterol: 90 mg/dL (ref 0–200)
HDL: 43 mg/dL (ref >40)
LDL Cholesterol: 38 mg/dL (ref 0–99)
Total CHOL/HDL Ratio: 2.1 RATIO
Triglycerides: 44 mg/dL (ref <150)
VLDL: 9 mg/dL (ref 0–40)

## 2021-01-05 LAB — LIPASE, BLOOD: Lipase: 63 U/L — ABNORMAL HIGH (ref 11–51)

## 2021-01-05 LAB — APTT: aPTT: 37 seconds — ABNORMAL HIGH (ref 24–36)

## 2021-01-05 MED ORDER — CARBIDOPA-LEVODOPA ER 25-100 MG PO TBCR: 1.0000 | EXTENDED_RELEASE_TABLET | ORAL | Status: DC

## 2021-01-05 MED ORDER — CARBIDOPA-LEVODOPA ER 25-100 MG PO TBCR
1.0000 | EXTENDED_RELEASE_TABLET | Freq: Every day | ORAL | Status: DC
  Administered 2021-01-05 – 2021-01-12 (×8): 1 via ORAL
  Filled 2021-01-05 (×10): qty 1

## 2021-01-05 MED ORDER — SODIUM CHLORIDE 0.9 % IV SOLN
1.0000 g | INTRAVENOUS | Status: DC
  Administered 2021-01-05 – 2021-01-09 (×5): 1 g via INTRAVENOUS
  Filled 2021-01-05 (×2): qty 1
  Filled 2021-01-05: qty 0.05
  Filled 2021-01-05 (×2): qty 1

## 2021-01-05 MED ORDER — CARBIDOPA-LEVODOPA ER 25-100 MG PO TBCR
2.0000 | EXTENDED_RELEASE_TABLET | Freq: Two times a day (BID) | ORAL | Status: DC
  Administered 2021-01-05 – 2021-01-13 (×16): 2 via ORAL
  Filled 2021-01-05 (×18): qty 2

## 2021-01-05 MED ORDER — MORPHINE SULFATE (PF) 2 MG/ML IV SOLN
1.0000 mg | Freq: Four times a day (QID) | INTRAVENOUS | Status: DC | PRN
  Administered 2021-01-09: 1 mg via INTRAVENOUS
  Filled 2021-01-05: qty 1

## 2021-01-05 MED ORDER — ONDANSETRON HCL 4 MG/2ML IJ SOLN: 4.0000 mg | Freq: Four times a day (QID) | INTRAMUSCULAR | Status: DC | PRN

## 2021-01-05 MED ORDER — SIMVASTATIN 20 MG PO TABS
20.0000 mg | ORAL_TABLET | Freq: Every evening | ORAL | Status: DC
  Administered 2021-01-05 – 2021-01-13 (×9): 20 mg via ORAL
  Filled 2021-01-05 (×9): qty 1

## 2021-01-05 MED ORDER — TAMSULOSIN HCL 0.4 MG PO CAPS
0.4000 mg | ORAL_CAPSULE | Freq: Every day | ORAL | Status: DC
  Administered 2021-01-05 – 2021-01-13 (×8): 0.4 mg via ORAL
  Filled 2021-01-05 (×8): qty 1

## 2021-01-05 MED ORDER — ACETAMINOPHEN 650 MG RE SUPP: 650.0000 mg | Freq: Four times a day (QID) | RECTAL | Status: DC | PRN

## 2021-01-05 MED ORDER — CHLORHEXIDINE GLUCONATE CLOTH 2 % EX PADS
6.0000 | MEDICATED_PAD | Freq: Every day | CUTANEOUS | Status: DC
  Administered 2021-01-05 – 2021-01-13 (×8): 6 via TOPICAL

## 2021-01-05 MED ORDER — APIXABAN 5 MG PO TABS
5.0000 mg | ORAL_TABLET | Freq: Two times a day (BID) | ORAL | Status: DC
  Administered 2021-01-05 – 2021-01-06 (×4): 5 mg via ORAL
  Filled 2021-01-05 (×4): qty 1

## 2021-01-05 MED ORDER — BUSPIRONE HCL 5 MG PO TABS
7.5000 mg | ORAL_TABLET | Freq: Two times a day (BID) | ORAL | Status: DC
  Administered 2021-01-05 – 2021-01-13 (×16): 7.5 mg via ORAL
  Filled 2021-01-05 (×16): qty 2

## 2021-01-05 MED ORDER — ALBUTEROL SULFATE (2.5 MG/3ML) 0.083% IN NEBU: 2.5000 mg | INHALATION_SOLUTION | Freq: Four times a day (QID) | RESPIRATORY_TRACT | Status: DC | PRN

## 2021-01-05 MED ORDER — POLYETHYLENE GLYCOL 3350 17 G PO PACK: 17.0000 g | PACK | Freq: Every day | ORAL | Status: DC | PRN

## 2021-01-05 MED ORDER — LOSARTAN POTASSIUM 50 MG PO TABS
50.0000 mg | ORAL_TABLET | Freq: Every day | ORAL | Status: DC
  Administered 2021-01-05 – 2021-01-13 (×9): 50 mg via ORAL
  Filled 2021-01-05 (×9): qty 1

## 2021-01-05 MED ORDER — RASAGILINE MESYLATE 1 MG PO TABS
1.0000 mg | ORAL_TABLET | Freq: Every day | ORAL | Status: DC
  Administered 2021-01-05 – 2021-01-13 (×8): 1 mg via ORAL
  Filled 2021-01-05 (×11): qty 1

## 2021-01-05 MED ORDER — IPRATROPIUM BROMIDE 0.02 % IN SOLN: 0.5000 mg | Freq: Four times a day (QID) | RESPIRATORY_TRACT | Status: DC | PRN

## 2021-01-05 MED ORDER — BISACODYL 5 MG PO TBEC: 5.0000 mg | DELAYED_RELEASE_TABLET | Freq: Every day | ORAL | Status: DC | PRN

## 2021-01-05 MED ORDER — MAGNESIUM CITRATE PO SOLN: 1.0000 | Freq: Once | ORAL | Status: DC | PRN

## 2021-01-05 MED ORDER — PANTOPRAZOLE SODIUM 40 MG PO TBEC
40.0000 mg | DELAYED_RELEASE_TABLET | Freq: Every day | ORAL | Status: DC
  Administered 2021-01-05 – 2021-01-13 (×8): 40 mg via ORAL
  Filled 2021-01-05 (×8): qty 1

## 2021-01-05 MED ORDER — PIMAVANSERIN TARTRATE 34 MG PO CAPS
34.0000 mg | ORAL_CAPSULE | Freq: Every day | ORAL | Status: DC
  Administered 2021-01-05 – 2021-01-13 (×8): 34 mg via ORAL

## 2021-01-05 MED ORDER — ONDANSETRON HCL 4 MG PO TABS: 4.0000 mg | ORAL_TABLET | Freq: Four times a day (QID) | ORAL | Status: DC | PRN

## 2021-01-05 MED ORDER — TRAMADOL HCL 50 MG PO TABS: 50.0000 mg | ORAL_TABLET | Freq: Four times a day (QID) | ORAL | Status: DC | PRN

## 2021-01-05 MED ORDER — TRAZODONE HCL 50 MG PO TABS: 25.0000 mg | ORAL_TABLET | Freq: Every evening | ORAL | Status: DC | PRN

## 2021-01-05 MED ORDER — SENNOSIDES-DOCUSATE SODIUM 8.6-50 MG PO TABS: 1.0000 | ORAL_TABLET | Freq: Every evening | ORAL | Status: DC | PRN

## 2021-01-05 MED ORDER — ACETAMINOPHEN 325 MG PO TABS
650.0000 mg | ORAL_TABLET | Freq: Four times a day (QID) | ORAL | Status: DC | PRN
  Administered 2021-01-10 – 2021-01-13 (×4): 650 mg via ORAL
  Filled 2021-01-05 (×4): qty 2

## 2021-01-05 MED ORDER — SODIUM CHLORIDE 0.9 % IV SOLN: INTRAVENOUS | Status: DC

## 2021-01-05 NOTE — Progress Notes (Signed)
Pt's wife expressed to RN the need for assistance with care of the pt at home due to his extensive medical needs. The pt and wife would like information on how the pt can receive additional care from their current home health agency.

## 2021-01-05 NOTE — Progress Notes (Signed)
Pt arrived to unit via stretcher room 1533. Wife at bedside. Alert and oriented x2, oriented to room and callbell with no complications. No complaint of pain. Hearing aid in R ear. 2 RN assessment completed. Initial assessment completed. Chronic foley in place. Will continue to monitor

## 2021-01-05 NOTE — Plan of Care (Signed)
  Problem: Education: Goal: Knowledge of General Education information will improve Description: Including pain rating scale, medication(s)/side effects and non-pharmacologic comfort measures Outcome: Progressing   Problem: Clinical Measurements: Goal: Ability to maintain clinical measurements within normal limits will improve Outcome: Progressing Goal: Diagnostic test results will improve Outcome: Progressing   Problem: Nutrition: Goal: Adequate nutrition will be maintained Outcome: Progressing   Problem: Safety: Goal: Ability to remain free from injury will improve Outcome: Progressing

## 2021-01-05 NOTE — Progress Notes (Signed)
PROGRESS NOTE    Robert [name]  ZRA:[id number] DOB: 01/30/1946 DOA: 01/04/2021 PCP: [name], MD    Chief Complaint  Patient presents with  . Vomiting    Brief Narrative:   75 y.o. 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  Patient was in a usual state of health until today when he developed fever to T-max of 100.9, . On arriva to ED, he was found to have acute pancreatitis and sepsis from UTI.   Assessment & Plan:   Active Problems:   Sepsis secondary to UTI ([place])   Pressure injury of skin    Acute pancreatitis:  ? Did he pass a gall stone. His alk phos and liver enzymes were elevated on admission.  elevated lipase on admit 279, improved to 63.  Liver enzymes improved and alk phos normalized.  CT abd pelvis showed gall stones , no CBD dilatation.  [place] abd does not show acute cholecystitis.  Will consult gen surgery when acute pancreatitis improves.  Started the patient on clears and advance as tolerated.     Sepsis sec to UTI:  Pt has chronic foley catheter, and it has been 3 weeks since last placement.  Will need replacement of coude catheter.  Continue with IV rocephin.  Urine cultures ordered.    BPH Sec to flomax.    Leukocytosis with left shift:  From UTI.    H/o cva with left hemiparesis PT evaluation ordered.  Continue with home meds.    Hypertension:  Well controlled.    Hyperlipidemia Resume statin.   Parkinson's disease:  Resume home meds.     PRESSURE injury on admission  Pressure Injury 01/05/21 Right Stage 2 -  Partial thickness loss of dermis presenting as a shallow open injury with a red, pink wound bed without slough. (Active)  01/05/21 0500  Location:   Location Orientation: Right  Staging: Stage 2 -  Partial thickness loss of  dermis presenting as a shallow open injury with a red, pink wound bed without slough.  Wound Description (Comments):   Present on Admission: Yes   Wound care consulted.       DVT prophylaxis: eliquis.  Code Status: full code.  Family Communication: family at bedside.  Disposition:   Status is: Inpatient  Remains inpatient appropriate because:Ongoing diagnostic testing needed not appropriate for outpatient work up, Unsafe d/c plan and IV treatments appropriate due to intensity of illness or inability to take PO   Dispo: The patient is from: Home              Anticipated d/c is to: Home              Patient currently is not medically stable to d/c.   Difficult to place patient No       Consultants:   None.    Procedures: none.    Antimicrobials: Antibiotics Given (last 72 hours)    Date/Time Action Medication Dose Rate   01/04/21 1952 New Bag/Given   cefTRIAXone (ROCEPHIN) 1 g in sodium chloride 0.9 % 100 mL IVPB 1 g 200 mL/hr          Subjective: No abd pain , nausea, vomiting. Scrotal swelling.   Objective: Vitals:   01/05/21 0326 01/05/21 0421 01/05/21 0818 01/05/21 1221  BP:  (!) 148/81 Marland Kitchen)  142/71 (!) 159/78  Pulse:  88 (!) 106 (!) 106  Resp:  (!) _0 Temp:  99.6 F (37.6 C) 99.4 F (37.4 C) 98.1 F (36.7 C)  TempSrc:  Oral    SpO2: 100% 97% 95% 99%  Weight:      Height:        Intake/Output Summary (Last 24 hours) at 01/05/2021 1526 Last data filed at 01/05/2021 1300 Gross per 24 hour  Intake 240 ml  Output 250 ml  Net -10 ml   Filed Weights   01/04/21 1708  Weight: 110.7 kg    Examination:  General exam: Appears calm and comfortable  Respiratory system: Clear to auscultation. Respiratory effort normal. Cardiovascular system: S1 & S2 heard, RRR. No JVD, . No pedal edema. Gastrointestinal system: Abdomen is nondistended, soft and nontender. Normal bowel sounds heard. Central nervous system: Alert and oriented. No focal  neurological deficits. Extremities: Symmetric 5 x 5 power. Skin: No rashes, lesions or ulcers Psychiatry: Mood & affect appropriate.     Data Reviewed: I have personally reviewed following labs and imaging studies  CBC: Recent Labs  Lab 01/04/21 1710 01/05/21 0454  WBC 13.3* 17.9*  NEUTROABS 10.8*  --   HGB 14.9 14.6  HCT 46.2 45.0  MCV 91.8 90.4  PLT 270 025    Basic Metabolic Panel: Recent Labs  Lab 01/04/21 1710 01/05/21 0454  NA 133* 133*  K 3.9 4.0  CL 99 101  CO2 21* 24  GLUCOSE 196* 159*  BUN 8 8  CREATININE 0.93 0.91  CALCIUM 8.9 8.7*    GFR: Estimated Creatinine Clearance: 95.7 mL/min (by C-G formula based on SCr of 0.91 mg/dL).  Liver Function Tests: Recent Labs  Lab 01/04/21 1710 01/05/21 0454  AST 138* 78*  ALT 68* 42  ALKPHOS 131* 118  BILITOT 2.9* 2.2*  PROT 7.1 6.8  ALBUMIN 3.5 3.3*    CBG: Recent Labs  Lab 01/04/21 1710  GLUCAP 179*     Recent Results (from the past 240 hour(s))  SARS CORONAVIRUS 2 (TAT 6-24 HRS) Nasopharyngeal Nasopharyngeal Swab     Status: None   Collection Time: 01/04/21  7:32 PM   Specimen: Nasopharyngeal Swab  Result Value Ref Range Status   SARS Coronavirus 2 NEGATIVE NEGATIVE Final    Comment: (NOTE) SARS-CoV-2 target nucleic acids are NOT DETECTED.  The SARS-CoV-2 RNA is generally detectable in upper and lower respiratory specimens during the acute phase of infection. Negative results do not preclude SARS-CoV-2 infection, do not rule out co-infections with other pathogens, and should not be used as the sole basis for treatment or other patient management decisions. Negative results must be combined with clinical observations, patient history, and epidemiological information. The expected result is Negative.  Fact Sheet for Patients: SugarRoll.be  Fact Sheet for Healthcare Providers: https://www.woods-mathews.com/  This test is not yet approved or  cleared by the [place] FDA and  has been authorized for detection and/or diagnosis of SARS-CoV-2 by FDA under an Emergency Use Authorization (EUA). This EUA will remain  in effect (meaning this test can be used) for the duration of the COVID-19 declaration under Se ction 564(b)(1) of the Act, 21 U.S.C. section 360bbb-3(b)(1), unless the authorization is terminated or revoked sooner.  Performed at [facility], [place] [address] [place], [place] [address]          Radiology Studies: CT Abdomen Pelvis W Contrast  Result Date: 01/04/2021 CLINICAL DATA:  Fever, vomiting EXAM: CT  ABDOMEN AND PELVIS WITH CONTRAST TECHNIQUE: Multidetector CT imaging of the abdomen and pelvis was performed using the standard protocol following bolus administration of intravenous contrast. CONTRAST:  161m OMNIPAQUE IOHEXOL 300 MG/ML  SOLN COMPARISON:  11/09/2020 FINDINGS: Lower chest: Coronary artery calcifications. Main pulmonary trunk measures 3.3 cm in diameter. No basilar airspace opacity. Hepatobiliary: There are numerous gallstones within the gallbladder lumen, many of which containing gas. Gallbladder is mildly distended. No definite pericholecystic inflammatory changes. Mild intrahepatic biliary dilatation. No focal liver lesion identified on unenhanced imaging. Pancreas: Peripancreatic fat stranding throughout the length of the pancreatic body and tail. No peripancreatic fluid collections. Spleen: Normal in size without focal abnormality. Adrenals/Urinary Tract: Unremarkable adrenal glands. Kidneys enhance symmetrically without suspicious lesion, stone, or hydronephrosis. Probable subcentimeter left renal cyst. Ureters are nondilated. Urinary bladder decompressed by Foley catheter. Small amount of fluid within known right-sided bladder diverticula. Stomach/Bowel: Stomach is within normal limits. Appendix appears normal (series 5, image 65). No evidence of bowel wall thickening, distention, or  inflammatory changes. Mild-moderate volume of stool throughout the colon. Vascular/Lymphatic: Atherosclerosis throughout the aortoiliac axis without aneurysm. IVC filter noted. No lymphadenopathy. Reproductive: Brachytherapy seeds within the prostate gland. Other: No free fluid. No abdominopelvic fluid collection. No pneumoperitoneum. No abdominal wall hernia. Musculoskeletal: No acute osseous findings. Flowing anterior endplate osteophytes throughout the thoracic and lumbar spine suggesting diffuse idiopathic skeletal hyperostosis or possibly ankylosing spondylitis. Degenerative changes of the bilateral hips. Partial ankylosis of the sacroiliac joints. IMPRESSION: 1. Findings suggestive of mild acute pancreatitis. 2. Cholelithiasis with mild gallbladder distention. No definite pericholecystic inflammatory changes. If there is clinical concern for acute cholecystitis, a right upper quadrant ultrasound could be performed for further evaluation. 3. Mild intrahepatic biliary dilatation. 4. Mild-moderate volume of stool throughout the colon. 5. Mildly dilated main pulmonary trunk suggesting pulmonary arterial hypertension. 6. Aortic atherosclerosis (ICD10-I70.0). Electronically Signed   By: NDavina PokeD.O.   On: 01/04/2021 19:26   DG Chest Port 1 View  Result Date: 01/04/2021 CLINICAL DATA:  Fever, vomiting EXAM: PORTABLE CHEST 1 VIEW COMPARISON:  11/27/2020 FINDINGS: The heart size and mediastinal contours are within normal limits. Both lungs are clear. The visualized skeletal structures are unremarkable. IMPRESSION: No active disease. Electronically Signed   By: MRanda NgoM.D.   On: 01/04/2021 17:59   [place]domen Limited RUQ (LIVER/GB)  Result Date: 01/05/2021 CLINICAL DATA:  Acute pancreatitis EXAM: ULTRASOUND ABDOMEN LIMITED RIGHT UPPER QUADRANT COMPARISON:  None. FINDINGS: Gallbladder: Visualization limited by a shadowing from numerous gallstones. A negative sonographic MPercell Millersign was reported by  the sonographer. No pericholecystic fluid or gallbladder wall thickening. Common bile duct: Diameter: 4 mm Liver: No focal lesion identified. Within normal limits in parenchymal echogenicity. Portal vein is patent on color Doppler imaging with normal direction of blood flow towards the liver. Other: None. IMPRESSION: Cholelithiasis without other evidence of acute cholecystitis. Electronically Signed   By: KUlyses JarredM.D.   On: 01/05/2021 00:37        Scheduled Meds: . apixaban  5 mg Oral BID  . busPIRone  7.5 mg Oral BID  . Carbidopa-Levodopa ER  1 tablet Oral QHS  . Carbidopa-Levodopa ER  2 tablet Oral BID  . Chlorhexidine Gluconate Cloth  6 each Topical Daily  . losartan  50 mg Oral Daily  . pantoprazole  40 mg Oral Daily  . Pimavanserin Tartrate  34 mg Oral Daily  . rasagiline  1 mg Oral Daily  . simvastatin  20 mg Oral QPM  .  tamsulosin  0.4 mg Oral Daily   Continuous Infusions: . sodium chloride 75 mL/hr at 01/05/21 0518  . cefTRIAXone (ROCEPHIN)  IV       LOS: 0 days        Hosie Poisson, MD [facility]   To contact the attending provider between 7A-7P or the covering provider during after hours 7P-7A, please log into the web site [email or web address] and access using universal [place] password for that web site. If you do not have the password, please call the hospital operator.  01/05/2021, 3:26 PM

## 2021-01-05 NOTE — Consult Note (Signed)
[place] Nurse Consult Note: Reason for Consult:Stage 2 pressure injury, POA. First assessed by Teodoro Spray earlier today. Wound type:pressure plus moisture Pressure Injury POA: Yes Measurement:To be obtained by Bedside RN today, K. Wilson and her assistance is appreciated. Wound bed:red, moist Drainage (amount, consistency, odor) small amount of serosanguinous exudate Periwound:intact Dressing procedure/placement/frequency: Turning and repositioning is in place, I will add Prevalon boots for protection of the heels in addition to topical wound care guidance for the sacrum.  Wound care to be twice daily with an antimicrobial nonadherent (xeroform) and topped with a silicone foam.  [place] team will not follow, but will remain available to this patient, the nursing and medical teams.  Please re-consult if needed. Thanks, Maudie Flakes, MSN, RN, [place], Arther Abbott  Pager# [phone]

## 2021-01-06 LAB — CBC WITH DIFFERENTIAL/PLATELET
Abs Immature Granulocytes: 0.05 10*3/uL (ref 0.00–0.07)
Basophils Absolute: 0.1 10*3/uL (ref 0.0–0.1)
Basophils Relative: 0 %
Eosinophils Absolute: 0.5 10*3/uL (ref 0.0–0.5)
Eosinophils Relative: 4 %
HCT: 41.3 % (ref 39.0–52.0)
Hemoglobin: 13.2 g/dL (ref 13.0–17.0)
Immature Granulocytes: 0 %
Lymphocytes Relative: 7 %
Lymphs Abs: 1 10*3/uL (ref 0.7–4.0)
MCH: 29.6 pg (ref 26.0–34.0)
MCHC: 32 g/dL (ref 30.0–36.0)
MCV: 92.6 fL (ref 80.0–100.0)
Monocytes Absolute: 1.6 10*3/uL — ABNORMAL HIGH (ref 0.1–1.0)
Monocytes Relative: 12 %
Neutro Abs: 10.5 10*3/uL — ABNORMAL HIGH (ref 1.7–7.7)
Neutrophils Relative %: 77 %
Platelets: 258 10*3/uL (ref 150–400)
RBC: 4.46 MIL/uL (ref 4.22–5.81)
RDW: 14.2 % (ref 11.5–15.5)
WBC: 13.7 10*3/uL — ABNORMAL HIGH (ref 4.0–10.5)
nRBC: 0 % (ref 0.0–0.2)

## 2021-01-06 LAB — BASIC METABOLIC PANEL
Anion gap: 9 (ref 5–15)
BUN: 8 mg/dL (ref 8–23)
CO2: 23 mmol/L (ref 22–32)
Calcium: 8.5 mg/dL — ABNORMAL LOW (ref 8.9–10.3)
Chloride: 101 mmol/L (ref 98–111)
Creatinine, Ser: 0.79 mg/dL (ref 0.61–1.24)
GFR, Estimated: >60 mL/min (ref >60)
Glucose, Bld: 150 mg/dL — ABNORMAL HIGH (ref 70–99)
Potassium: 3.7 mmol/L (ref 3.5–5.1)
Sodium: 133 mmol/L — ABNORMAL LOW (ref 135–145)

## 2021-01-06 LAB — MAGNESIUM: Magnesium: 1.9 mg/dL (ref 1.7–2.4)

## 2021-01-06 LAB — LIPASE, BLOOD: Lipase: 36 U/L (ref 11–51)

## 2021-01-06 NOTE — Evaluation (Signed)
Physical Therapy Evaluation Patient Details Name: Robert [name] MRN: [id number] DOB: [birth year] Today's Date: 01/06/2021   History of Present Illness  75 yo male presented with fever and vomiting; admitted with sepsis 2* UTI and acute pancreatitis. Hx of dementia, CVA with L residual weakness/drop foot, DVT, IVC filter, hearing loss, Parkinson's.  Clinical Impression  The patient 's wife present to provide information as well as patient . Patient at baseline requires 1 assist to transfer using hemiwallker, and able to take a few steps. Patient  curretnly goes to OPPT 2 week. Per wife, partient has not been out of bed sine 3/9.Marland KitchenPatient5 required extensive assistance of 2 to stand from bed, patient anxious. Lateral scoot to recliner, recommend  Mechanical lift to return to bed.  Patient's wife requesting patient go for SNF/rehab as  She is primary caregiver.  Pt admitted with above diagnosis.  Pt currently with functional limitations due to the deficits listed below (see PT Problem List). Pt will benefit from skilled PT to increase their independence and safety with mobility to allow discharge to the venue listed below.       Follow Up Recommendations SNF    Equipment Recommendations  None recommended by PT    Recommendations for Other Services       Precautions / Restrictions Precautions Precautions: Fall Required Braces or Orthoses: Other Brace Other Brace: LT double upright AFO Restrictions Weight Bearing Restrictions: No      Mobility  Bed Mobility Overal bed mobility: Needs Assistance Bed Mobility: Supine to Sit     Supine to sit: HOB elevated;Max assist;+2 for physical assistance     General bed mobility comments: Step by step cues with spouse working closely with pt to allow for pt's routine, and therapy assisting as needed.    Transfers Overall transfer level: Needs assistance Equipment used: Hemi-walker Transfers: Sit to/from Stand;Lateral/Scoot Transfers Sit to  Stand: Max assist;+2 physical assistance        Lateral/Scoot Transfers: +2 physical assistance;Max assist General transfer comment: Max of 2 to stand from  raised bed x 2,  assist with placing  Left foot prior to stnading. Transfer to recliner   with lateral scoot using bed pad and dropped armrest on recliner  Ambulation/Gait                Stairs            Wheelchair Mobility    Modified Rankin (Stroke Patients Only)       Balance Overall balance assessment: Needs assistance Sitting-balance support: Single extremity supported;Feet unsupported;Feet supported Sitting balance-Leahy Scale: Poor Sitting balance - Comments: once  positioned on bed ege, patient able to sit unsupported with close supervsion. Postural control: Posterior lean Standing balance support: Single extremity supported Standing balance-Leahy Scale: Poor Standing balance comment: Initial stand from elevated EOB, pt with HR increase to 121. Pt reported that his RT foot was in a supinated position and pt required lower back to EOB. 2nd stand required that pt's RT knee be stabalized to promote RT foot flat on floor. Each stand with hemiwalker and Max As of 2 people.                             Pertinent Vitals/Pain Pain Assessment: No/denies pain    Home Living Family/patient expects to be discharged to:: Skilled nursing facility Living Arrangements: Spouse/significant other Available Help at Discharge: Family;Available 24 hours/day Type of Home: Apartment Home Access: Level entry  Home Layout: One level Home Equipment: Transport chair;Hospital bed;Tub bench;Bedside commode Additional Comments: Tub bench on rails that slides and swivels.  Pt's wife states that transferring pt into tub is very difficult due to pt freezing with inability to sit on command. Hospital bed with air mattress.  Transport chair. Hemi-walker    Prior Function Level of Independence: Needs assistance   Gait  / Transfers Assistance Needed: wife assists pt into transport chair. non-ambulatory except for a couple of steps with assistance and hemiwalker.  Going to outpatient PT 2x/week.  ADL's / Homemaking Assistance Needed: wife assists with all ADLs and IADLs  Comments: Wife assists with all ADLs.     Hand Dominance   Dominant Hand: Right    Extremity/Trunk Assessment   Upper Extremity Assessment Upper Extremity Assessment: Defer to OT evaluation RUE Deficits / Details: AROM/MMT: WFL RUE Sensation: WNL RUE Coordination: WNL LUE Deficits / Details: No active ROM. Increased hand flexor tone. Spouse reports that pt does not use a splint to LT hand, but always rest hand with fingers extended. Hand can be manually opened. LUE Coordination: decreased fine motor;decreased gross motor    Lower Extremity Assessment Lower Extremity Assessment: LLE deficits/detail LLE Deficits / Details: mild increased tone, no active movement, Does bear weight with AFO/brace, when standing       Communication   Communication: No difficulties;Other (comment) (Slight slur, but understandable.)  Cognition Arousal/Alertness: Awake/alert Behavior During Therapy: Anxious Overall Cognitive Status: History of cognitive impairments - at baseline                                 General Comments: Pt oriented to person and place. Wife assisting pt with orientation questions by giving cues as needed. No apparent change from baseline. Pt with decreased awareness of current impairments.      General Comments      Exercises     Assessment/Plan    PT Assessment Patient needs continued PT services  PT Problem List Decreased strength;Decreased mobility;Decreased safety awareness;Decreased range of motion;Decreased coordination;Decreased knowledge of precautions;Decreased activity tolerance;Cardiopulmonary status limiting activity;Decreased cognition;Decreased balance;Decreased knowledge of use of DME        PT Treatment Interventions DME instruction;Therapeutic activities;Therapeutic exercise;Patient/family education;Functional mobility training;Balance training    PT Goals (Current goals can be found in the Care Plan section)  Acute Rehab PT Goals Patient Stated Goal: Per wife: For pt to agree to go to Rehab/SNF PT Goal Formulation: With patient/family Time For Goal Achievement: 01/20/21 Potential to Achieve Goals: Good    Frequency Min 2X/week   Barriers to discharge        Co-evaluation PT/OT/SLP Co-Evaluation/Treatment: Yes Reason for Co-Treatment: For patient/therapist safety;Complexity of the patient's impairments (multi-system involvement) PT goals addressed during session: Mobility/safety with mobility OT goals addressed during session: ADL's and self-care       AM-PAC PT "6 Clicks" Mobility  Outcome Measure Help needed turning from your back to your side while in a flat bed without using bedrails?: Total Help needed moving from lying on your back to sitting on the side of a flat bed without using bedrails?: Total Help needed moving to and from a bed to a chair (including a wheelchair)?: Total Help needed standing up from a chair using your arms (e.g., wheelchair or bedside chair)?: Total Help needed to walk in hospital room?: Total Help needed climbing 3-5 steps with a railing? : Total 6 Click Score: 6  End of Session Equipment Utilized During Treatment: Gait belt Activity Tolerance: Patient limited by fatigue;Patient tolerated treatment well Patient left: in chair;with call bell/phone within reach;with chair alarm set;with family/visitor present Nurse Communication: Mobility status;Need for lift equipment PT Visit Diagnosis: Muscle weakness (generalized) (M62.81);Other symptoms and signs involving the nervous system (R29.898);Hemiplegia and hemiparesis Hemiplegia - Right/Left: Left Hemiplegia - dominant/non-dominant: Non-dominant Hemiplegia - caused by: Other  cerebrovascular disease    Time: 0915-0952 PT Time Calculation (min) (ACUTE ONLY): 37 min   Charges:   PT Evaluation $PT Eval Moderate Complexity: [place] Pager [phone] Office [phone]   Claretha Cooper 01/06/2021, 11:08 AM

## 2021-01-06 NOTE — Progress Notes (Signed)
PROGRESS NOTE    Robert [name]  FGH:[id number] DOB: 1946-09-06 DOA: 01/04/2021 PCP: [name], MD    Chief Complaint  Patient presents with  . Vomiting    Brief Narrative:   75 y.o. 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  Patient was in a usual state of health when he developed fever to T-max of 100.9, . On arrival to ED, he was found to have acute pancreatitis and sepsis from UTI.  CT of the abdomen with contrast shows numerous gallstones within the gallbladder lumen, mildly distended, no pericholecystic inflammatory changes.  Mild intrahepatic biliary dilatation.  No peripancreatic fluid collections right upper quadrant ultrasound showed numerous gallstones with no pericholecystic fluid or gallbladder wall thickening.  His abdominal pain, nausea and vomiting have resolved and his pancreatic enzymes have normalized.  He was started on clear liquid diet advance to full liquid diet.  Surgery consulted for possible/probable gallstone pancreatitis.  Assessment & Plan:   Active Problems:   Sepsis secondary to UTI ([place])   Pressure injury of skin    Acute pancreatitis:  Probable gallstone pancreatitis.  his alk phos and liver enzymes were elevated on admission.  elevated lipase on admit 279, improved to 63 to 36 today Liver enzymes improved and alk phos normalized.  CT abd pelvis showed gall stones , no CBD dilatation.  [place] abd does not show acute cholecystitis, multiple gallstones in the lumen, no pericholecystic fluid or gallbladder wall thickening. General surgery consultation for probable gallstone pancreatitis Started the patient on clears and advance as tolerated.     Sepsis sec to UTI:  Sepsis physiology has improved, lactic acid is 1.  Repeat WBC count today Pt has chronic  foley catheter, and it has been 3 weeks since last placement.  Placement of the coud catheter Continue with IV rocephin.  Urine cultures ordered, currently waiting for culture report   BPH Sec to flomax.    Leukocytosis with left shift:  From UTI.    H/o cva with left hemiparesis PT evaluation ordered recommending SNF on discharge, TOC on board Continue with home meds.    Hypertension:  Blood pressure parameters are optimal   Hyperlipidemia Resume statin.   Parkinson's disease:  Resume home meds.   History of DVT S/p IVC filter and on Eliquis for anticoagulation.  PRESSURE injury on admission  Pressure Injury 01/05/21 Right Stage 2 -  Partial thickness loss of dermis presenting as a shallow open injury with a red, pink wound bed without slough. (Active)  01/05/21 0500  Location:   Location Orientation: Right  Staging: Stage 2 -  Partial thickness loss of dermis presenting as a shallow open injury with a red, pink wound bed without slough.  Wound Description (Comments):   Present on Admission: Yes   Wound care consulted and recommendations given.      DVT prophylaxis: eliquis.  Code Status: full code.  Family Communication: family at bedside.  Disposition:   Status is: Inpatient  Remains inpatient appropriate because:Ongoing diagnostic testing needed not appropriate for outpatient work up, Unsafe d/c plan and IV treatments appropriate due to intensity of illness or inability to take PO   Dispo: The patient is from: Home              Anticipated d/c is to: Home  Patient currently is not medically stable to d/c.   Difficult to place patient No       Consultants:   None.    Procedures: none.    Antimicrobials: Antibiotics Given (last 72 hours)    Date/Time Action Medication Dose Rate   01/04/21 1952 New Bag/Given   cefTRIAXone (ROCEPHIN) 1 g in sodium chloride 0.9 % 100 mL IVPB 1 g 200 mL/hr   01/05/21 1958 New Bag/Given    cefTRIAXone (ROCEPHIN) 1 g in sodium chloride 0.9 % 100 mL IVPB 1 g 200 mL/hr         Subjective: No nausea vomiting or abdominal pain today  Objective: Vitals:   01/05/21 0818 01/05/21 1221 01/05/21 2016 01/06/21 0425  BP: (!) 142/71 (!) 159/78 130/71 127/71  Pulse: (!) 106 (!) 106 96 81  Resp: _0 Temp: 99.4 F (37.4 C) 98.1 F (36.7 C) 99.8 F (37.7 C) 98.7 F (37.1 C)  TempSrc:   Oral Oral  SpO2: 95% 99% 95% 97%  Weight:      Height:        Intake/Output Summary (Last 24 hours) at 01/06/2021 0801 Last data filed at 01/06/2021 7591 Gross per 24 hour  Intake 2032.87 ml  Output --  Net 2032.87 ml   Filed Weights   01/04/21 1708  Weight: 110.7 kg    Examination:  General exam: Alert and comfortable not in any kind of distress Respiratory system: Entry fair bilateral, no wheezing or rhonchi on room air Cardiovascular system: S1-S2 heard, regular rate rhythm, no JVD. Gastrointestinal system: Abdomen is soft, nontender bowel sounds normal Central nervous system: Alert and oriented to person and place Extremities: No cyanosis Skin: Stage II pressure injury present on the back Psychiatry: Mood appropriate    Data Reviewed: I have personally reviewed following labs and imaging studies  CBC: Recent Labs  Lab 01/04/21 1710 01/05/21 0454  WBC 13.3* 17.9*  NEUTROABS 10.8*  --   HGB 14.9 14.6  HCT 46.2 45.0  MCV 91.8 90.4  PLT 270 638    Basic Metabolic Panel: Recent Labs  Lab 01/04/21 1710 01/05/21 0454 01/06/21 0336  NA 133* 133* 133*  K 3.9 4.0 3.7  CL 99 101 101  CO2 21* 24 23  GLUCOSE 196* 159* 150*  BUN _1 CREATININE 0.93 0.91 0.79  CALCIUM 8.9 8.7* 8.5*  MG  --   --  1.9    GFR: Estimated Creatinine Clearance: 108.9 mL/min (by C-G formula based on SCr of 0.79 mg/dL).  Liver Function Tests: Recent Labs  Lab 01/04/21 1710 01/05/21 0454  AST 138* 78*  ALT 68* 42  ALKPHOS 131* 118  BILITOT 2.9* 2.2*  PROT 7.1 6.8   ALBUMIN 3.5 3.3*    CBG: Recent Labs  Lab 01/04/21 1710  GLUCAP 179*     Recent Results (from the past 240 hour(s))  SARS CORONAVIRUS 2 (TAT 6-24 HRS) Nasopharyngeal Nasopharyngeal Swab     Status: None   Collection Time: 01/04/21  7:32 PM   Specimen: Nasopharyngeal Swab  Result Value Ref Range Status   SARS Coronavirus 2 NEGATIVE NEGATIVE Final    Comment: (NOTE) SARS-CoV-2 target nucleic acids are NOT DETECTED.  The SARS-CoV-2 RNA is generally detectable in upper and lower respiratory specimens during the acute phase of infection. Negative results do not preclude SARS-CoV-2 infection, do not rule out co-infections with other pathogens, and should not be used as the sole basis for treatment or other  patient management decisions. Negative results must be combined with clinical observations, patient history, and epidemiological information. The expected result is Negative.  Fact Sheet for Patients: SugarRoll.be  Fact Sheet for Healthcare Providers: https://www.woods-mathews.com/  This test is not yet approved or cleared by the [place] FDA and  has been authorized for detection and/or diagnosis of SARS-CoV-2 by FDA under an Emergency Use Authorization (EUA). This EUA will remain  in effect (meaning this test can be used) for the duration of the COVID-19 declaration under Se ction 564(b)(1) of the Act, 21 U.S.C. section 360bbb-3(b)(1), unless the authorization is terminated or revoked sooner.  Performed at [facility], [place] [address] [place], [place] [address]          Radiology Studies: CT Abdomen Pelvis W Contrast  Result Date: 01/04/2021 CLINICAL DATA:  Fever, vomiting EXAM: CT ABDOMEN AND PELVIS WITH CONTRAST TECHNIQUE: Multidetector CT imaging of the abdomen and pelvis was performed using the standard protocol following bolus administration of intravenous contrast. CONTRAST:  135m OMNIPAQUE IOHEXOL 300  MG/ML  SOLN COMPARISON:  11/09/2020 FINDINGS: Lower chest: Coronary artery calcifications. Main pulmonary trunk measures 3.3 cm in diameter. No basilar airspace opacity. Hepatobiliary: There are numerous gallstones within the gallbladder lumen, many of which containing gas. Gallbladder is mildly distended. No definite pericholecystic inflammatory changes. Mild intrahepatic biliary dilatation. No focal liver lesion identified on unenhanced imaging. Pancreas: Peripancreatic fat stranding throughout the length of the pancreatic body and tail. No peripancreatic fluid collections. Spleen: Normal in size without focal abnormality. Adrenals/Urinary Tract: Unremarkable adrenal glands. Kidneys enhance symmetrically without suspicious lesion, stone, or hydronephrosis. Probable subcentimeter left renal cyst. Ureters are nondilated. Urinary bladder decompressed by Foley catheter. Small amount of fluid within known right-sided bladder diverticula. Stomach/Bowel: Stomach is within normal limits. Appendix appears normal (series 5, image 65). No evidence of bowel wall thickening, distention, or inflammatory changes. Mild-moderate volume of stool throughout the colon. Vascular/Lymphatic: Atherosclerosis throughout the aortoiliac axis without aneurysm. IVC filter noted. No lymphadenopathy. Reproductive: Brachytherapy seeds within the prostate gland. Other: No free fluid. No abdominopelvic fluid collection. No pneumoperitoneum. No abdominal wall hernia. Musculoskeletal: No acute osseous findings. Flowing anterior endplate osteophytes throughout the thoracic and lumbar spine suggesting diffuse idiopathic skeletal hyperostosis or possibly ankylosing spondylitis. Degenerative changes of the bilateral hips. Partial ankylosis of the sacroiliac joints. IMPRESSION: 1. Findings suggestive of mild acute pancreatitis. 2. Cholelithiasis with mild gallbladder distention. No definite pericholecystic inflammatory changes. If there is clinical  concern for acute cholecystitis, a right upper quadrant ultrasound could be performed for further evaluation. 3. Mild intrahepatic biliary dilatation. 4. Mild-moderate volume of stool throughout the colon. 5. Mildly dilated main pulmonary trunk suggesting pulmonary arterial hypertension. 6. Aortic atherosclerosis (ICD10-I70.0). Electronically Signed   By: NDavina PokeD.O.   On: 01/04/2021 19:26   DG Chest Port 1 View  Result Date: 01/04/2021 CLINICAL DATA:  Fever, vomiting EXAM: PORTABLE CHEST 1 VIEW COMPARISON:  11/27/2020 FINDINGS: The heart size and mediastinal contours are within normal limits. Both lungs are clear. The visualized skeletal structures are unremarkable. IMPRESSION: No active disease. Electronically Signed   By: MRanda NgoM.D.   On: 01/04/2021 17:59   [place]domen Limited RUQ (LIVER/GB)  Result Date: 01/05/2021 CLINICAL DATA:  Acute pancreatitis EXAM: ULTRASOUND ABDOMEN LIMITED RIGHT UPPER QUADRANT COMPARISON:  None. FINDINGS: Gallbladder: Visualization limited by a shadowing from numerous gallstones. A negative sonographic MPercell Millersign was reported by the sonographer. No pericholecystic fluid or gallbladder wall thickening. Common bile duct: Diameter:  4 mm Liver: No focal lesion identified. Within normal limits in parenchymal echogenicity. Portal vein is patent on color Doppler imaging with normal direction of blood flow towards the liver. Other: None. IMPRESSION: Cholelithiasis without other evidence of acute cholecystitis. Electronically Signed   By: Ulyses Jarred M.D.   On: 01/05/2021 00:37        Scheduled Meds: . apixaban  5 mg Oral BID  . busPIRone  7.5 mg Oral BID  . Carbidopa-Levodopa ER  1 tablet Oral QHS  . Carbidopa-Levodopa ER  2 tablet Oral BID  . Chlorhexidine Gluconate Cloth  6 each Topical Daily  . losartan  50 mg Oral Daily  . pantoprazole  40 mg Oral Daily  . Pimavanserin Tartrate  34 mg Oral Daily  . rasagiline  1 mg Oral Daily  . simvastatin  20  mg Oral QPM  . tamsulosin  0.4 mg Oral Daily   Continuous Infusions: . sodium chloride 75 mL/hr at 01/06/21 0618  . cefTRIAXone (ROCEPHIN)  IV 1 g (01/05/21 1958)     LOS: 1 day        Hosie Poisson, MD [facility]   To contact the attending provider between 7A-7P or the covering provider during after hours 7P-7A, please log into the web site [email or web address] and access using universal [place] password for that web site. If you do not have the password, please call the hospital operator.  01/06/2021, 8:01 AM

## 2021-01-06 NOTE — Consult Note (Signed)
I have been asked to see the patient by Dr. Hosie Poisson, for evaluation and management of recurrent UTIs.  History of present illness: 75 year old male with a history of stroke related to a DVT with some residual left-sided hemiparesis (now on Eliquis) and Parkinson's disease who also has a significant history of urinary retention and recurrent infections.  Following the patient stroke in 2008 he did have issues with urinary retention, but was ultimately able to void on his own.  He was followed closely by Dr. 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  In 2019 the patient again developed urinary retention, and failed several voiding trials.  He subsequently underwent a UroLift by Dr. Diona Fanti x2 with no improvement in his urinary retention.  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  He is now dependent on Foley catheters.  He was having them changed at home health.  Initially, they were changing him every 4 weeks, but then this was changed every 3 weeks after he was admitted to the hospital for infection which ultimately turned out to be that his catheter had been inflated in his prosthetic urethra and was draining poorly.  January 15 the patient was admitted for urinary tract infection.  At that time the catheter was exchanged.  He then switched home health agencies and at that point was having his catheter changed every 4 weeks.  It was changed on March 10.  The patient had been doing quite well and there was no signs or symptoms of infection within his Foley catheter/bladder.  The patient's presentation prior to this hospitalization was associated with p.o. intolerance, low-grade fever, and weakness.  Further evaluation demonstrated some inflammation around his gallbladder as well as gallstones.  He was empirically started on ceftriaxone, no urine culture was obtained at this  visit.  Review of systems: A 12 point comprehensive review of systems was obtained and is negative unless otherwise stated in the history of present illness.  Patient Active Problem List   Diagnosis Date Noted  . Pressure injury of skin 01/05/2021  . Sepsis secondary to UTI ([place]) 11/10/2020  . AKI (acute kidney injury) ([place]) 11/10/2020  . Hyperkalemia 11/10/2020  . Parkinson disease ([place]) 11/10/2020  . Hyperglycemia 11/10/2020  . Elevated LFTs 11/10/2020  . Diarrhea   . Other hydronephrosis   . History of deep vein thrombosis (DVT) of lower extremity   . Urinary retention 08/14/2019  . History of CVA with residual deficit   . BPH (benign prostatic hyperplasia)   . Orthostatic hypotension 08/13/2019  . Hyperlipidemia   . Hemiparesis affecting left side as late effect of cerebrovascular accident ([place]) 12/06/2017  . Gait abnormality 11/22/2017  . UTI (urinary tract infection) 04/22/2017  . Acute lower UTI 04/22/2017  . Tremor 04/22/2017  . Acute pyelonephritis 09/29/2012  . Nausea 09/29/2012  . Fever 09/29/2012  . HTN (hypertension) 09/29/2012  . H/O: CVA (cerebrovascular accident) 09/29/2012  . Hearing loss 09/29/2012    No current facility-administered medications on file prior to encounter.   Current Outpatient Medications on File Prior to Encounter  Medication Sig Dispense Refill  . acetaminophen (TYLENOL) 500 MG tablet Take 1,000 mg by mouth every 6 (six) hours as needed for moderate pain.    Marland Kitchen apixaban (ELIQUIS) 5 MG TABS tablet Take 1 tablet (5 mg total) by mouth 2 (two) times daily. 60 tablet 0  . busPIRone (BUSPAR)  7.5 MG tablet Take 7.5 mg by mouth 2 (two) times daily.    . Carbidopa-Levodopa ER (SINEMET CR) 25-100 MG tablet controlled release TAKE TWO TABLETS BY MOUTH THREE TIMES DAILY  (Patient taking differently: Take 1-2 tablets by mouth See admin instructions. Takes 2 tablets in the morning, 2 tablets in the afternoon, and 1 tablet at night) 180 tablet 3  .  loratadine (CLARITIN) 10 MG tablet Take 10 mg by mouth daily.    Marland Kitchen losartan (COZAAR) 50 MG tablet Take 50 mg by mouth daily.    Marland Kitchen omeprazole (PRILOSEC) 20 MG capsule Take 20 mg by mouth daily as needed (heartburn).    Debby Freiberg Tartrate (NUPLAZID) 34 MG CAPS Take 34 mg by mouth daily.    . polyethylene glycol (MIRALAX) packet Take 17 g by mouth daily. (Patient taking differently: Take 17 g by mouth daily as needed for mild constipation.) 14 each 0  . rasagiline (AZILECT) 1 MG TABS tablet Take 1 mg by mouth daily.    . Simethicone 250 MG CAPS Take 250 mg by mouth daily as needed (bloating).    . simvastatin (ZOCOR) 20 MG tablet Take 20 mg by mouth every evening.    . tamsulosin (FLOMAX) 0.4 MG CAPS capsule Take 0.4 mg by mouth daily.    Marland Kitchen lidocaine (LIDODERM) 5 % Place 1 patch onto the skin daily. Remove & Discard patch within 12 hours or as directed by MD (Patient not taking: Reported on 01/04/2021) 14 patch 0    Past Medical History:  Diagnosis Date  . BPH (benign prostatic hyperplasia)   . CVA (cerebral vascular accident) ([place])    with left sided hemiparesis  . Diverticulosis   . Frequency of urination   . GERD (gastroesophageal reflux disease)   . Gross hematuria   . History of acute pyelonephritis    10-13-2012  . History of CVA with residual deficit    2008--  left side of body weakness and foot drop (wears leg brace and uses cane)  . History of DVT of lower extremity    2008--  cva  . Hypercholesteremia   . Hyperlipidemia   . Hyperlipidemia   . Hypertension   . 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 Left leg DVT ([place])   . 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 Urethral stricture   . Urgency of urination   . Urinary retention   . Weakness of left side of body    secondary to cva 2008  . Wears glasses   . Wears hearing aid    bilateral-- wears intermittantly    Past Surgical History:  Procedure Laterality Date  . CYSTOSCOPY WITH  RETROGRADE URETHROGRAM N/A 10/23/2015   Procedure: CYSTOSCOPY WITH RETROGRADE URETHROGRAM;  Surgeon: [name], MD;  Location: [facility];  Service: Urology;  Laterality: N/A;  . CYSTOSCOPY WITH URETHRAL DILATATION N/A 10/23/2015   Procedure: CYSTOSCOPY WITH URETHRAL BALLOON DILATATION;  Surgeon: [name], MD;  Location: [facility];  Service: Urology;  Laterality: N/A;  BALLOON DILATION   . IVC FILTER PLACEMENT ([place] [facility])  2008    Social History   Tobacco Use  . Smoking status: Former Smoker    Years: 10.00    Types: Cigarettes    Quit date: 10/26/1970    Years since quitting: 50.2  . Smokeless tobacco: Never Used  Vaping Use  . Vaping Use: Never used  Substance  Use Topics  . Alcohol use: No  . Drug use: No    Family History  Problem Relation Age of Onset  . Diabetes Sister   . Lung cancer Brother        Post 9/11 voluntary work in [place]  . Prostate cancer Brother   . Other Mother        passed away young- non medical  . Alzheimer's disease Father   . Healthy Son     PE: Vitals:   01/05/21 0818 01/05/21 1221 01/05/21 2016 01/06/21 0425  BP: (!) 142/71 (!) 159/78 130/71 127/71  Pulse: (!) 106 (!) 106 96 81  Resp: 16 15 16 20   Temp: 99.4 F (37.4 C) 98.1 F (36.7 C) 99.8 F (37.7 C) 98.7 F (37.1 C)  TempSrc:   Oral Oral  SpO2: 95% 99% 95% 97%  Weight:      Height:       Patient appears to be in no acute distress  patient is alert and oriented x3 Atraumatic normocephalic head No cervical or supraclavicular lymphadenopathy appreciated No increased work of breathing, no audible wheezes/rhonchi Regular sinus rhythm/rate Abdomen is soft, nontender, nondistended, no CVA or suprapubic tenderness Catheter is draining clear yellow urine Lower extremities are symmetric without appreciable edema Grossly neurologically intact No identifiable skin lesions  Recent Labs    01/04/21 1710 01/05/21 0454 01/06/21 0336  WBC 13.3* 17.9*  13.7*  HGB 14.9 14.6 13.2  HCT 46.2 45.0 41.3   Recent Labs    01/04/21 1710 01/05/21 0454 01/06/21 0336  NA 133* 133* 133*  K 3.9 4.0 3.7  CL 99 101 101  CO2 21* 24 23  GLUCOSE 196* 159* 150*  BUN 8 8 8   CREATININE 0.93 0.91 0.79  CALCIUM 8.9 8.7* 8.5*   Recent Labs    01/05/21 0454  INR 1.6*   No results for input(s): LABURIN in the last 72 hours. Results for orders placed or performed during the hospital encounter of 01/04/21  SARS CORONAVIRUS 2 (TAT 6-24 HRS) Nasopharyngeal Nasopharyngeal Swab     Status: None   Collection Time: 01/04/21  7:32 PM   Specimen: Nasopharyngeal Swab  Result Value Ref Range Status   SARS Coronavirus 2 NEGATIVE NEGATIVE Final    Comment: (NOTE) SARS-CoV-2 target nucleic acids are NOT DETECTED.  The SARS-CoV-2 RNA is generally detectable in upper and lower respiratory specimens during the acute phase of infection. Negative results do not preclude SARS-CoV-2 infection, do not rule out co-infections with other pathogens, and should not be used as the sole basis for treatment or other patient management decisions. Negative results must be combined with clinical observations, patient history, and epidemiological information. The expected result is Negative.  Fact Sheet for Patients: SugarRoll.be  Fact Sheet for Healthcare Providers: https://www.woods-mathews.com/  This test is not yet approved or cleared by the [place] FDA and  has been authorized for detection and/or diagnosis of SARS-CoV-2 by FDA under an Emergency Use Authorization (EUA). This EUA will remain  in effect (meaning this test can be used) for the duration of the COVID-19 declaration under Se ction 564(b)(1) of the Act, 21 U.S.C. section 360bbb-3(b)(1), unless the authorization is terminated or revoked sooner.  Performed at [facility], [place] [address] [place], [place] [address]     Imaging: Vented dependently  reviewed the patient's recent CT scan which was performed on 01/04/2021 which demonstrates some peripancreatic inflammation.  There does not appear to be any perinephric stranding or anything  on the imaging to suggest infection of the urinary track.  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  His catheter was exchanged several days prior to his presentation.  At that time, the patient was having no signs or symptoms of infection and was doing quite well.  While its possible, I am hesitant to associate his current admission with a recurrent infection because he had no symptoms of UTI leading up to his presenting symptoms.  His symptoms are more generalized and appear to be in my opinion more likely associated with his GI tract.  Recommendations: I spoke to the patient and his wife regarding the presentation.  We discussed better catheter hygiene.  I reiterated the importance of fluid intake and management of his constipation.  Given that he is currently empirically on broad-spectrum antibiotics and appears to be improving, I would continue these antibiotics.  I will defer any judgment or diagnosis and management relating to the gallstones and his mild pancreatitis to general surgery.  At this point, the patient needs his catheter, it was recently changed and is not need to be changed while in the hospital.  We discussed returning back to a 4-week interval catheter change protocol.  The catheter should be changed using a coud tipped catheter.  Home health has already been informed about this.  We will follow-up with him as an outpatient.  Ardis Hughs

## 2021-01-06 NOTE — Evaluation (Signed)
Occupational Therapy Evaluation Patient Details Name: Robert [name] MRN: [id number] DOB: 14-Jan-1946 Today's Date: 01/06/2021    History of Present Illness 75 yo male presented with fever and vomiting; admitted with sepsis 2* UTI and acute pancreatitis. Hx of dementia, CVA with L residual weakness/drop foot, DVT, IVC filter, hearing loss, Parkinson's.   Clinical Impression   Patient is currently requiring assistance with ADLs including total assist with toileting, and with LE dressing, maximum assist with bathing, and moderate to maximum assist with UE dressing and bathing, all of which is below patient's typical baseline where pt's wife does assist with all aspects of ADLs and mobility but less so than is required now.  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 [employer] AM-PAC "6-clicks" Daily Activity Inpatient Short Form score of 11/24 indicates 70.42% ADL impairment this session. Patient lives with his wife, who is able to provide 24/7 supervision and assistance, but cannot safely care for pt at home in his current level of function.  Patient demonstrates good rehab potential, and should benefit from continued skilled occupational therapy services while in acute care to maximize safety, independence and quality of life at home.  Continued occupational therapy services in a SNF setting prior to return home is recommended.  ?   Follow Up Recommendations  SNF    Equipment Recommendations   (Defer to post-acute recommendations)    Recommendations for Other Services       Precautions / Restrictions Precautions Precautions:  Fall Required Braces or Orthoses: Other Brace Other Brace: LT double upright AFO Restrictions Weight Bearing Restrictions: No      Mobility Bed Mobility Overal bed mobility: Needs Assistance Bed Mobility: Supine to Sit     Supine to sit: HOB elevated;Max assist;+2 for physical assistance     General bed mobility comments: Step by step cues with spouse working closely with pt to allow for pt's routine, and therapy assisting as needed. Patient Response: Anxious;Cooperative  Transfers Overall transfer level: Needs assistance Equipment used: Hemi-walker Transfers: Sit to/from Stand;Lateral/Scoot Transfers Sit to Stand: Max assist;+2 physical assistance        Lateral/Scoot Transfers: +2 physical assistance;Max assist      Balance Overall balance assessment: Needs assistance Sitting-balance support: Single extremity supported;Feet unsupported;Feet supported Sitting balance-Leahy Scale: Poor   Postural control: Posterior lean Standing balance support: Single extremity supported Standing balance-Leahy Scale: Poor Standing balance comment: Initial stand from elevated EOB, pt with HR increase to 121. Pt reported that his RT foot was in a supinated position and pt required lower back to EOB. 2nd stand required that pt's RT knee be stabalized to promote RT foot flat on floor. Each stand with hemiwalker and Max As of 2 people.                           ADL either performed or assessed with clinical judgement   ADL Overall ADL's : Needs assistance/impaired Eating/Feeding: Set up;Supervision/ safety Eating/Feeding Details (indicate cue type and reason): Liquid diet restrictions. Grooming: Supervision/safety;Set up;Sitting;Wash/dry face;Moderate assistance Grooming Details (indicate cue type and reason): Would anticipate increased need of moderate assistance with 2-handed grooming tasks. Upper Body Bathing: Moderate assistance;Sitting  Lower Body Bathing: Maximal  assistance;Sitting/lateral leans;Bed level   Upper Body Dressing : Maximal assistance;Sitting   Lower Body Dressing: Total assistance;Sitting/lateral leans Lower Body Dressing Details (indicate cue type and reason): To don RT shoe with spouse using shoe horn. and to don LT AFO.   Toilet Transfer Details (indicate cue type and reason): Please see mobility. Pt unable to pivot this visit. Used scooting technique to t/f to chair. Toileting- Clothing Manipulation and Hygiene: Total assistance;Bed level Toileting - Clothing Manipulation Details (indicate cue type and reason): Foley catheter.   Tub/Shower Transfer Details (indicate cue type and reason): Unable to assess for safety. Functional mobility during ADLs: Maximal assistance;+2 for physical assistance;+2 for safety/equipment;Cueing for safety;Cueing for sequencing (Hemiwalker to stand)       Vision   Vision Assessment?: No apparent visual deficits Additional Comments: Bilateral hearing aids.     Perception     Praxis      Pertinent Vitals/Pain Pain Assessment: No/denies pain     Hand Dominance Right   Extremity/Trunk Assessment Upper Extremity Assessment Upper Extremity Assessment: LUE deficits/detail;RUE deficits/detail RUE Deficits / Details: AROM/MMT: WFL RUE Sensation: WNL RUE Coordination: WNL LUE Deficits / Details: No active ROM. Increased hand flexor tone. Spouse reports that pt does not use a splint to LT hand, but always rest hand with fingers extended. Hand can be manually opened. LUE Coordination: decreased fine motor;decreased gross motor   Lower Extremity Assessment Lower Extremity Assessment: Defer to PT evaluation       Communication Communication Communication: No difficulties;Other (comment) (Slight slur, but understandable.)   Cognition Arousal/Alertness: Awake/alert Behavior During Therapy: WFL for tasks assessed/performed Overall Cognitive Status: History of cognitive impairments - at baseline                                  General Comments: Pt oriented to person and place. Wife assisting pt with orientation questions by giving cues as needed. No apparent change from baseline. Pt with decreased awareness of current impairments.   General Comments       Exercises     Shoulder Instructions      Home Living Family/patient expects to be discharged to:: Skilled nursing facility Living Arrangements: Spouse/significant other Available Help at Discharge: Family;Available 24 hours/day Type of Home: Apartment Home Access: Level entry     Home Layout: One level     Bathroom Shower/Tub: Chief Strategy Officer: Standard     Home Equipment: Transport chair;Hospital bed;Tub bench;Bedside commode   Additional Comments: Tub bench on rails that slides and swivels.  Pt's wife states that transferring pt into tub is very difficult due to pt freezing with inability to sit on command. Hospital bed with air mattress.  Transport chair. Hemi-walker      Prior Functioning/Environment Level of Independence: Needs assistance  Gait / Transfers Assistance Needed: wife assists pt into transport chair. non-ambulatory except for a couple of steps with assistance and hemiwalker.  Going to outpatient PT 2x/week. ADL's / Homemaking Assistance Needed: wife assists with all ADLs and IADLs   Comments: Wife assists with all ADLs.        OT Problem List: Decreased strength;Decreased coordination;Cardiopulmonary status limiting activity;Decreased range of motion;Decreased activity tolerance;Decreased safety awareness;Decreased knowledge of use of DME or AE;Impaired balance (sitting and/or standing);Impaired UE functional use      OT Treatment/Interventions: Self-care/ADL training;Therapeutic exercise;Therapeutic activities;Neuromuscular education;Cognitive remediation/compensation;DME and/or AE instruction;Patient/family education;Balance training  OT Goals(Current goals  can be found in the care plan section) Acute Rehab OT Goals Patient Stated Goal: Per wife: For pt to agree to go to Rehab/SNF OT Goal Formulation: With patient/family Time For Goal Achievement: 01/20/21 Potential to Achieve Goals: Good ADL Goals Pt Will Perform Upper Body Bathing: with caregiver independent in assisting;with adaptive equipment;with min assist;sitting Pt Will Perform Lower Body Bathing: with min assist;sitting/lateral leans;with adaptive equipment;sit to/from stand;with caregiver independent in assisting Pt Will Perform Upper Body Dressing: with caregiver independent in assisting;with adaptive equipment;with min assist;sitting Pt Will Perform Lower Body Dressing: sitting/lateral leans;sit to/from stand;with adaptive equipment;with caregiver independent in assisting;with mod assist Pt Will Transfer to Toilet: with min guard assist;stand pivot transfer;bedside commode Additional ADL Goal #1: Pt will perform grooming or similar RT handed functional activity with setup/supervision to Min Assist and demonstrate good sitting balance in order to decrease caregiver burden.  OT Frequency: Min 2X/week   Barriers to D/C:    Spouse has no other support.  Bathroom is not accessible.       Co-evaluation PT/OT/SLP Co-Evaluation/Treatment: Yes Reason for Co-Treatment: Complexity of the patient's impairments (multi-system involvement);To address functional/ADL transfers PT goals addressed during session: Mobility/safety with mobility OT goals addressed during session: ADL's and self-care      AM-PAC OT "6 Clicks" Daily Activity     Outcome Measure Help from another person eating meals?: A Little Help from another person taking care of personal grooming?: A Lot Help from another person toileting, which includes using toliet, bedpan, or urinal?: Total Help from another person bathing (including washing, rinsing, drying)?: A Lot Help from another person to put on and taking off regular  upper body clothing?: A Lot Help from another person to put on and taking off regular lower body clothing?: Total 6 Click Score: 11   End of Session Equipment Utilized During Treatment: Gait belt;Other (comment) (Hemiwalker) Nurse Communication: Mobility status;Need for lift equipment  Activity Tolerance: Patient limited by fatigue Patient left: in chair;with call bell/phone within reach;with chair alarm set;with family/visitor present (Hoyer pad in chair)  OT Visit Diagnosis: Unsteadiness on feet (R26.81);Muscle weakness (generalized) (M62.81);Hemiplegia and hemiparesis Hemiplegia - Right/Left: Left Hemiplegia - dominant/non-dominant: Non-Dominant Hemiplegia - caused by: Cerebral infarction (remote)                Time: [id number] OT Time Calculation (min): 34 min Charges:  OT General Charges $OT Visit: 1 Visit OT Evaluation $OT Eval Moderate Complexity: 1 Mod  Tenlee Wollin, OT Acute Rehab Services Office: [phone] 01/06/2021  Theodoro Clock 01/06/2021, 10:52 AM

## 2021-01-06 NOTE — Consult Note (Addendum)
[facility] Surgery Consult Note  Robert [name] Mar 15, 1946  [id number].    Requesting MD: Venia Minks Chief Complaint: Vomiting, abdominal pain, weakness Reason for Consult: Cholelithiasis/elevated LFTs/pancreatitis  HPI:  Robert [name] is a 75 year old with multiple medical issues, most recently hospitalized 1/15-1/18/2022 with UTI sepsis, and urinary retention.  Robert [name] has a history of CVA with residual left hemiparesis, Hx DVT with IVC, AKI, Parkinson's disease, & GERD.  Robert [name] presented to the hospital via EMS, on 01/04/2021, with fever, vomiting, and weakness.  His wife reports Robert [name] started having abdominal discomfort and could not eat because his abdomen was so distended starting on 01/01/2021.  They tried multiple things at home without any real success.  Robert [name] finally presented to the ED via EMS with the above-noted complaints.  Work-up in the ED shows a temperature of 101.8, his other vital signs were stable.  Admission labs shows a sodium of 133, glucose of 196, lipase of 279, AST 138, ALT 68, total bilirubin 2.9, WBC 13.1, H/H 14.9/46.2, platelets 270,000.  Urinalysis was consistent with another UTI.  Chest x-ray showed no active disease.  CT of the abdomen with contrast: Numerous gallstones within the gallbladder lumen which may contain gas mildly distended, no pericholecystic inflammatory changes.  Mild intrahepatic biliary dilatation, no focal liver lesions.  Pancreatic fat stranding throughout the length of the pancreatic body and tail.  No peripancreatic fluid collections.  Findings were consistent with mild acute pancreatitis, cholelithiasis with mild gallbladder distention but no definite pericholecystic inflammatory changes.   A right upper quadrant ultrasound was then obtained.  This showed numerous gallstones no pericholecystic fluid or gallbladder wall thickening.  CBD is 4 mm.  Liver was unremarkable.  Findings consistent with cholelithiasis without evidence of acute cholecystitis.  Robert [name] was  admitted with sepsis secondary to UTI and acute pancreatitis.  Is also been found to have a stage II pressure injury and is seen by wound care.   Labs obtained yesterday shows a white count of 17.9, H/H 14.6/45, platelets 280,000, INR 1.6, lipase was down to 63 and is 36 this AM.  Triglycerides 44, Robert [name] does not use alcohol.  I do not see a urine culture.  We are asked to see. Robert [name] is currently on a full liquid diet, Robert [name] is on apixaban, last dose 10:12 AM 01/06/2021.  ROS: Review of Systems  Constitutional: Positive for fever and malaise/fatigue. Negative for chills, diaphoresis and weight loss.  HENT: Negative.   Eyes: Negative.   Respiratory: Negative.   Cardiovascular: Negative.   Gastrointestinal: Positive for abdominal pain and heartburn. Negative for blood in stool, constipation, diarrhea and melena.  Genitourinary:       Chronic foley - DR. Louis Meckel, now changing q 3 weeks per Home Health nurse  Musculoskeletal:       Robert [name] has limited mobility.  Robert [name] lives at home with his wife who gets him up in the chair, and a wheelchair.  Robert [name] has a brace on his left lower extremity with left-sided hemiparesis.  Robert [name] gets PT twice a week, but has very limited mobility.  Skin: Negative.   Neurological:       Left hemiparesis from a CVA  Endo/Heme/Allergies: Bruises/bleeds easily.       His wife thinks Robert [name] has been on anticoagulants about 2 years.  Psychiatric/Behavioral: Negative.     Family History  Problem Relation Age of Onset  . Diabetes Sister   . Lung cancer Brother        Post 9/11 voluntary work in [place]  .  Prostate cancer Brother   . Other Mother        passed away young- non medical  . Alzheimer's disease Father   . Healthy Son     Past Medical History:  Diagnosis Date  . BPH (benign prostatic hyperplasia)   . CVA (cerebral vascular accident) ([place])    with left sided hemiparesis  . Diverticulosis   . Frequency of urination   . GERD (gastroesophageal reflux disease)   . Gross hematuria    . History of acute pyelonephritis    10-13-2012  . History of CVA with residual deficit    2008--  left side of body weakness and foot drop (wears leg brace and uses cane)  . History of DVT of lower extremity    2008--  cva  . Hypercholesteremia   . Hyperlipidemia   . Hyperlipidemia   . Hypertension   . 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 Left leg DVT ([place])   . 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 Urethral stricture   . Urgency of urination   . Urinary retention   . Weakness of left side of body    secondary to cva 2008  . Wears glasses   . Wears hearing aid    bilateral-- wears intermittantly    Past Surgical History:  Procedure Laterality Date  . CYSTOSCOPY WITH RETROGRADE URETHROGRAM N/A 10/23/2015   Procedure: CYSTOSCOPY WITH RETROGRADE URETHROGRAM;  Surgeon: [name], MD;  Location: [facility];  Service: Urology;  Laterality: N/A;  . CYSTOSCOPY WITH URETHRAL DILATATION N/A 10/23/2015   Procedure: CYSTOSCOPY WITH URETHRAL BALLOON DILATATION;  Surgeon: [name], MD;  Location: [facility];  Service: Urology;  Laterality: N/A;  BALLOON DILATION   . IVC FILTER PLACEMENT ([place] [facility])  2008    Social History:  reports that Robert [name] quit smoking about 50 years ago. His smoking use included cigarettes. Robert [name] quit after 10.00 years of use. Robert [name] has never used smokeless tobacco. Robert [name] reports that Robert [name] does not drink alcohol and does not use drugs.  Allergies:  Allergies  Allergen Reactions  . Propofol Other (See Comments)    "hiccups for weeks"  . Lisinopril     Other reaction(s): Cough    Medications Prior to Admission  Medication Sig Dispense Refill  . acetaminophen (TYLENOL) 500 MG tablet Take 1,000 mg by mouth every 6 (six) hours as needed for moderate pain.    Marland Kitchen apixaban (ELIQUIS) 5 MG TABS tablet Take 1 tablet (5 mg total) by mouth 2 (two) times daily. 60 tablet 0  . busPIRone (BUSPAR) 7.5 MG tablet Take 7.5 mg  by mouth 2 (two) times daily.    . Carbidopa-Levodopa ER (SINEMET CR) 25-100 MG tablet controlled release TAKE TWO TABLETS BY MOUTH THREE TIMES DAILY  (Robert [name] taking differently: Take 1-2 tablets by mouth See admin instructions. Takes 2 tablets in the morning, 2 tablets in the afternoon, and 1 tablet at night) 180 tablet 3  . loratadine (CLARITIN) 10 MG tablet Take 10 mg by mouth daily.    Marland Kitchen losartan (COZAAR) 50 MG tablet Take 50 mg by mouth daily.    Marland Kitchen omeprazole (PRILOSEC) 20 MG capsule Take 20 mg by mouth daily as needed (heartburn).    Debby Freiberg Tartrate (NUPLAZID) 34 MG CAPS Take 34 mg by mouth daily.    . polyethylene glycol (MIRALAX) packet Take 17 g by mouth daily. (Robert [name] taking differently:  Take 17 g by mouth daily as needed for mild constipation.) 14 each 0  . RASAGILINE MESYLATE PO Take 1 mg by mouth daily.    . Simethicone 250 MG CAPS Take 250 mg by mouth daily as needed (bloating).    . simvastatin (ZOCOR) 20 MG tablet Take 20 mg by mouth every evening.    . tamsulosin (FLOMAX) 0.4 MG CAPS capsule Take 0.4 mg by mouth daily.    Marland Kitchen lidocaine (LIDODERM) 5 % Place 1 patch onto the skin daily. Remove & Discard patch within 12 hours or as directed by MD (Robert [name] not taking: Reported on 01/04/2021) 14 patch 0    Blood pressure 127/71, pulse 81, temperature 98.7 F (37.1 C), temperature source Oral, resp. rate 20, height 6\' 2"  (1.88 m), weight 110.7 kg, SpO2 97 %. Physical Exam:  General: pleasant, WD, WN, over weight AA male who is up in the chair,  in NAD HEENT: head is normocephalic, atraumatic.  Sclera are noninjected.  Pupils are equal ears and nose without any masses or lesions.  Mouth is pink and moist Heart: regular, rate, and rhythm.  Normal s1,s2. No obvious murmurs, gallops, or rubs noted.  Palpable radial and pedal pulses bilaterally Lungs: CTAB, no wheezes, rhonchi, or rales noted.  Respiratory effort nonlabored Abd: soft, NT, ND, +BS, no masses, hernias, or  organomegaly, no tenderness on palpation this a.m. No prior abdominal surgeries. MS: all 4 extremities are symmetrical with no cyanosis, clubbing, or edema.  Robert [name] has left arm and left lower extremity hemiparesis with a brace on the left lower extremity Skin: warm and dry with no masses, lesions, or rashes; stage II pressure injury sacrum. Neuro: Cranial nerves 2-12 grossly intact, sensation is normal throughout Psych: A&Ox3 with an appropriate affect.   Results for orders placed or performed during the hospital encounter of 01/04/21 (from the past 48 hour(s))  Comprehensive metabolic panel     Status: Abnormal   Collection Time: 01/04/21  5:10 PM  Result Value Ref Range   Sodium 133 (L) 135 - 145 mmol/L   Potassium 3.9 3.5 - 5.1 mmol/L   Chloride 99 98 - 111 mmol/L   CO2 21 (L) 22 - 32 mmol/L   Glucose, Bld 196 (H) 70 - 99 mg/dL    Comment: Glucose reference range applies only to samples taken after fasting for at least 8 hours.   BUN 8 8 - 23 mg/dL   Creatinine, Ser 0.93 0.61 - 1.24 mg/dL   Calcium 8.9 8.9 - 10.3 mg/dL   Total Protein 7.1 6.5 - 8.1 g/dL   Albumin 3.5 3.5 - 5.0 g/dL   AST 138 (H) 15 - 41 U/L   ALT 68 (H) 0 - 44 U/L   Alkaline Phosphatase 131 (H) 38 - 126 U/L   Total Bilirubin 2.9 (H) 0.3 - 1.2 mg/dL   GFR, Estimated >60 >60 mL/min    Comment: (NOTE) Calculated using the CKD-EPI Creatinine Equation (2021)    Anion gap 13 5 - 15    Comment: Performed at [facility], [place] [address] [place], [place] [address]  Lipase, blood     Status: Abnormal   Collection Time: 01/04/21  5:10 PM  Result Value Ref Range   Lipase 279 (H) 11 - 51 U/L    Comment: Performed at [facility], [place] [address], [place], [place] [address]  CBC WITH DIFFERENTIAL     Status: Abnormal   Collection Time: 01/04/21  5:10 PM  Result Value Ref Range   WBC 13.3 (H) 4.0 - 10.5 K/uL   RBC 5.03 4.22 - 5.81 MIL/uL   Hemoglobin 14.9 13.0 - 17.0 g/dL   HCT 46.2  39.0 - 52.0 %   MCV 91.8 80.0 - 100.0 fL   MCH 29.6 26.0 - 34.0 pg   MCHC 32.3 30.0 - 36.0 g/dL   RDW 13.7 11.5 - 15.5 %   Platelets 270 150 - 400 K/uL   nRBC 0.0 0.0 - 0.2 %   Neutrophils Relative % 80 %   Neutro Abs 10.8 (H) 1.7 - 7.7 K/uL   Lymphocytes Relative 5 %   Lymphs Abs 0.6 (L) 0.7 - 4.0 K/uL   Monocytes Relative 14 %   Monocytes Absolute 1.8 (H) 0.1 - 1.0 K/uL   Eosinophils Relative 0 %   Eosinophils Absolute 0.0 0.0 - 0.5 K/uL   Basophils Relative 0 %   Basophils Absolute 0.0 0.0 - 0.1 K/uL   Immature Granulocytes 1 %   Abs Immature Granulocytes 0.06 0.00 - 0.07 K/uL    Comment: Performed at [facility], [place] [address], [place], [place] [address]  CBG monitoring, ED     Status: Abnormal   Collection Time: 01/04/21  5:10 PM  Result Value Ref Range   Glucose-Capillary 179 (H) 70 - 99 mg/dL    Comment: Glucose reference range applies only to samples taken after fasting for at least 8 hours.   Comment 1 Notify RN   Brain natriuretic peptide     Status: Abnormal   Collection Time: 01/04/21  5:11 PM  Result Value Ref Range   B Natriuretic Peptide 111.1 (H) 0.0 - 100.0 pg/mL    Comment: Performed at [facility], [place] [address] [place], [place] [address]  Urinalysis, Routine w reflex microscopic     Status: Abnormal   Collection Time: 01/04/21  5:46 PM  Result Value Ref Range   Color, Urine AMBER (A) YELLOW    Comment: BIOCHEMICALS MAY BE AFFECTED BY COLOR   APPearance CLOUDY (A) CLEAR   Specific Gravity, Urine 1.009 1.005 - 1.030   pH 7.0 5.0 - 8.0   Glucose, UA 50 (A) NEGATIVE mg/dL   Hgb urine dipstick MODERATE (A) NEGATIVE   Bilirubin Urine NEGATIVE NEGATIVE   Ketones, ur NEGATIVE NEGATIVE mg/dL   Protein, ur NEGATIVE NEGATIVE mg/dL   Nitrite POSITIVE (A) NEGATIVE   Leukocytes,Ua LARGE (A) NEGATIVE   RBC / HPF 11-20 0 - 5 RBC/hpf   WBC, UA >50 (H) 0 - 5 WBC/hpf   Bacteria, UA MANY (A) NONE SEEN   Squamous Epithelial /  LPF 0-5 0 - 5   WBC Clumps PRESENT    Mucus PRESENT    Non Squamous Epithelial 0-5 (A) NONE SEEN    Comment: Performed at [facility], [place] [address] [place], [place] [address]  SARS CORONAVIRUS 2 (TAT 6-24 HRS) Nasopharyngeal Nasopharyngeal Swab     Status: None   Collection Time: 01/04/21  7:32 PM   Specimen: Nasopharyngeal Swab  Result Value Ref Range   SARS Coronavirus 2 NEGATIVE NEGATIVE    Comment: (NOTE) SARS-CoV-2 target nucleic acids are NOT DETECTED.  The SARS-CoV-2 RNA is generally detectable in upper and lower respiratory specimens during the acute phase of infection. Negative results do not preclude SARS-CoV-2 infection, do not rule out co-infections with other pathogens, and should not be used as the sole basis for treatment or other Robert [name] management decisions. Negative results  must be combined with clinical observations, Robert [name] history, and epidemiological information. The expected result is Negative.  Fact Sheet for Patients: SugarRoll.be  Fact Sheet for Healthcare Providers: https://www.woods-mathews.com/  This test is not yet approved or cleared by the [place] FDA and  has been authorized for detection and/or diagnosis of SARS-CoV-2 by FDA under an Emergency Use Authorization (EUA). This EUA will remain  in effect (meaning this test can be used) for the duration of the COVID-19 declaration under Se ction 564(b)(1) of the Act, 21 U.S.C. section 360bbb-3(b)(1), unless the authorization is terminated or revoked sooner.  Performed at [facility], [place] [address], [place], [place] [address]   Lactic acid, plasma     Status: None   Collection Time: 01/05/21  4:54 AM  Result Value Ref Range   Lactic Acid, Venous 1.0 0.5 - 1.9 mmol/L    Comment: Performed at [facility], [place] [address], [place], [place] [address]  Protime-INR     Status: Abnormal   Collection Time:  01/05/21  4:54 AM  Result Value Ref Range   Prothrombin Time 18.0 (H) 11.4 - 15.2 seconds   INR 1.6 (H) 0.8 - 1.2    Comment: (NOTE) INR goal varies based on device and disease states. Performed at [facility], [place] [address], [place], [place] [address]   APTT     Status: Abnormal   Collection Time: 01/05/21  4:54 AM  Result Value Ref Range   aPTT 37 (H) 24 - 36 seconds    Comment:        IF BASELINE aPTT IS ELEVATED, SUGGEST Robert [name] RISK ASSESSMENT BE USED TO DETERMINE APPROPRIATE ANTICOAGULANT THERAPY. Performed at [facility], [place] [address] [place], [place] [address]   Comprehensive metabolic panel     Status: Abnormal   Collection Time: 01/05/21  4:54 AM  Result Value Ref Range   Sodium 133 (L) 135 - 145 mmol/L   Potassium 4.0 3.5 - 5.1 mmol/L   Chloride 101 98 - 111 mmol/L   CO2 24 22 - 32 mmol/L   Glucose, Bld 159 (H) 70 - 99 mg/dL    Comment: Glucose reference range applies only to samples taken after fasting for at least 8 hours.   BUN 8 8 - 23 mg/dL   Creatinine, Ser 0.91 0.61 - 1.24 mg/dL   Calcium 8.7 (L) 8.9 - 10.3 mg/dL   Total Protein 6.8 6.5 - 8.1 g/dL   Albumin 3.3 (L) 3.5 - 5.0 g/dL   AST 78 (H) 15 - 41 U/L   ALT 42 0 - 44 U/L   Alkaline Phosphatase 118 38 - 126 U/L   Total Bilirubin 2.2 (H) 0.3 - 1.2 mg/dL   GFR, Estimated >60 >60 mL/min    Comment: (NOTE) Calculated using the CKD-EPI Creatinine Equation (2021)    Anion gap 8 5 - 15    Comment: Performed at [facility], [place] [address], [place], [place] [address]  CBC     Status: Abnormal   Collection Time: 01/05/21  4:54 AM  Result Value Ref Range   WBC 17.9 (H) 4.0 - 10.5 K/uL   RBC 4.98 4.22 - 5.81 MIL/uL   Hemoglobin 14.6 13.0 - 17.0 g/dL   HCT 45.0 39.0 - 52.0 %   MCV 90.4 80.0 - 100.0 fL   MCH 29.3 26.0 - 34.0 pg   MCHC 32.4 30.0 - 36.0 g/dL   RDW 13.9 11.5 - 15.5 %  Platelets 280 150 - 400 K/uL   nRBC 0.0 0.0 - 0.2 %     Comment: Performed at [facility], [place] [address], [place], [place] [address]  Lactic acid, plasma     Status: None   Collection Time: 01/05/21  8:10 AM  Result Value Ref Range   Lactic Acid, Venous 1.0 0.5 - 1.9 mmol/L    Comment: Performed at [facility], [place] [address], [place], [place] [address]  Lipid panel     Status: None   Collection Time: 01/05/21  8:57 AM  Result Value Ref Range   Cholesterol 90 0 - 200 mg/dL   Triglycerides 44 <150 mg/dL   HDL 43 >40 mg/dL   Total CHOL/HDL Ratio 2.1 RATIO   VLDL 9 0 - 40 mg/dL   LDL Cholesterol 38 0 - 99 mg/dL    Comment:        Total Cholesterol/HDL:CHD Risk Coronary Heart Disease Risk Table                     Men   Women  1/2 Average Risk   3.4   3.3  Average Risk       5.0   4.4  2 X Average Risk   9.6   7.1  3 X Average Risk  23.4   11.0        Use the calculated Robert [name] Ratio above and the CHD Risk Table to determine the Robert [name]'s CHD Risk.        ATP III CLASSIFICATION (LDL):  <100     mg/dL   Optimal  100-129  mg/dL   Near or Above                    Optimal  130-159  mg/dL   Borderline  160-189  mg/dL   High  >190     mg/dL   Very High Performed at [place] [address], [place], [place] [address]   Lipase, blood     Status: Abnormal   Collection Time: 01/05/21  8:57 AM  Result Value Ref Range   Lipase 63 (H) 11 - 51 U/L    Comment: Performed at [facility], [place] [address] [place], [place] [address]  Basic metabolic panel     Status: Abnormal   Collection Time: 01/06/21  3:36 AM  Result Value Ref Range   Sodium 133 (L) 135 - 145 mmol/L   Potassium 3.7 3.5 - 5.1 mmol/L   Chloride 101 98 - 111 mmol/L   CO2 23 22 - 32 mmol/L   Glucose, Bld 150 (H) 70 - 99 mg/dL    Comment: Glucose reference range applies only to samples taken after fasting for at least 8 hours.   BUN 8 8 - 23 mg/dL   Creatinine, Ser 0.79 0.61 - 1.24 mg/dL    Calcium 8.5 (L) 8.9 - 10.3 mg/dL   GFR, Estimated >60 >60 mL/min    Comment: (NOTE) Calculated using the CKD-EPI Creatinine Equation (2021)    Anion gap 9 5 - 15    Comment: Performed at [facility], [place] [address] [place], [place] [address]  Lipase, blood     Status: None   Collection Time: 01/06/21  3:36 AM  Result Value Ref Range   Lipase 36 11 - 51 U/L    Comment: Performed at [facility], [place] [address] [place], [place] [address]  Magnesium     Status: None   Collection Time: 01/06/21  3:36 AM  Result Value Ref Range   Magnesium 1.9 1.7 - 2.4 mg/dL    Comment: Performed at [facility], [place] [name], [place], [place] [address]   CT Abdomen Pelvis W Contrast  Result Date: 01/04/2021 CLINICAL DATA:  Fever, vomiting EXAM: CT ABDOMEN AND PELVIS WITH CONTRAST TECHNIQUE: Multidetector CT imaging of the abdomen and pelvis was performed using the standard protocol following bolus administration of intravenous contrast. CONTRAST:  143mL OMNIPAQUE IOHEXOL 300 MG/ML  SOLN COMPARISON:  11/09/2020 FINDINGS: Lower chest: Coronary artery calcifications. Main pulmonary trunk measures 3.3 cm in diameter. No basilar airspace opacity. Hepatobiliary: There are numerous gallstones within the gallbladder lumen, many of which containing gas. Gallbladder is mildly distended. No definite pericholecystic inflammatory changes. Mild intrahepatic biliary dilatation. No focal liver lesion identified on unenhanced imaging. Pancreas: Peripancreatic fat stranding throughout the length of the pancreatic body and tail. No peripancreatic fluid collections. Spleen: Normal in size without focal abnormality. Adrenals/Urinary Tract: Unremarkable adrenal glands. Kidneys enhance symmetrically without suspicious lesion, stone, or hydronephrosis. Probable subcentimeter left renal cyst. Ureters are nondilated. Urinary bladder decompressed by Foley catheter. Small amount of  fluid within known right-sided bladder diverticula. Stomach/Bowel: Stomach is within normal limits. Appendix appears normal (series 5, image 65). No evidence of bowel wall thickening, distention, or inflammatory changes. Mild-moderate volume of stool throughout the colon. Vascular/Lymphatic: Atherosclerosis throughout the aortoiliac axis without aneurysm. IVC filter noted. No lymphadenopathy. Reproductive: Brachytherapy seeds within the prostate gland. Other: No free fluid. No abdominopelvic fluid collection. No pneumoperitoneum. No abdominal wall hernia. Musculoskeletal: No acute osseous findings. Flowing anterior endplate osteophytes throughout the thoracic and lumbar spine suggesting diffuse idiopathic skeletal hyperostosis or possibly ankylosing spondylitis. Degenerative changes of the bilateral hips. Partial ankylosis of the sacroiliac joints. IMPRESSION: 1. Findings suggestive of mild acute pancreatitis. 2. Cholelithiasis with mild gallbladder distention. No definite pericholecystic inflammatory changes. If there is clinical concern for acute cholecystitis, a right upper quadrant ultrasound could be performed for further evaluation. 3. Mild intrahepatic biliary dilatation. 4. Mild-moderate volume of stool throughout the colon. 5. Mildly dilated main pulmonary trunk suggesting pulmonary arterial hypertension. 6. Aortic atherosclerosis (ICD10-I70.0). Electronically Signed   By: Davina Poke D.O.   On: 01/04/2021 19:26   DG Chest Port 1 View  Result Date: 01/04/2021 CLINICAL DATA:  Fever, vomiting EXAM: PORTABLE CHEST 1 VIEW COMPARISON:  11/27/2020 FINDINGS: The heart size and mediastinal contours are within normal limits. Both lungs are clear. The visualized skeletal structures are unremarkable. IMPRESSION: No active disease. Electronically Signed   By: Randa Ngo M.D.   On: 01/04/2021 17:59   US Abdomen Limited RUQ (LIVER/GB)  Result Date: 01/05/2021 CLINICAL DATA:  Acute pancreatitis EXAM:  ULTRASOUND ABDOMEN LIMITED RIGHT UPPER QUADRANT COMPARISON:  None. FINDINGS: Gallbladder: Visualization limited by a shadowing from numerous gallstones. A negative sonographic Percell Miller sign was reported by the sonographer. No pericholecystic fluid or gallbladder wall thickening. Common bile duct: Diameter: 4 mm Liver: No focal lesion identified. Within normal limits in parenchymal echogenicity. Portal vein is patent on color Doppler imaging with normal direction of blood flow towards the liver. Other: None. IMPRESSION: Cholelithiasis without other evidence of acute cholecystitis. Electronically Signed   By: Ulyses Jarred M.D.   On: 01/05/2021 00:37   . sodium chloride 75 mL/hr at 01/06/21 0618  . cefTRIAXone (ROCEPHIN)  IV 1 g (01/05/21 1958)      Assessment/Plan Hx CVA with left  hemiparesis(brace LLE) Parkinson's disease DVT with IVC/on Eliquis(LD 01/06/2021@10  AM) Hypertension Hyperlipidemia BPH Hx urethral stricture with chronic indwelling Foley Stage II decubitus OOB to wheelchair/Chair - PT twice a week BMI 31.3  Recurrent UTI with sepsis Hx pyelonephritis  - no  Urine culture pending Probable gallstone pancreatitis  -WBC 17.9(3/13) >>13.7(3/14)  FEN: Full liquids ID: Rocephin 3/12>> day 3 DVT: Eliquis Follow-up: TBD   Plan: This is his second UTI in 3 months with a chronic indwelling Foley.  Robert [name] has cholelithiasis, and it is possible that Robert [name] also passed a stone causing some pancreatitis, and elevated LFTs; which currently seems to be resolving.  It is Dr. Clyda Greener opinion that Robert [name] should be seen by urology for their input.  Pending urology and medicine recommendations, Robert [name] can be considered for laparoscopic cholecystectomy.  UTI would need to be resolved and Robert [name] would have to be off apixaban for at least 48 hours.  We will follow with you.  [facility] 01/06/2021, 10:02 AM Please see Amion for pager number during day hours 7:00am-4:30pm

## 2021-01-07 DIAGNOSIS — K851 Biliary acute pancreatitis without necrosis or infection: Secondary | ICD-10-CM

## 2021-01-07 DIAGNOSIS — Z978 Presence of other specified devices: Secondary | ICD-10-CM

## 2021-01-07 DIAGNOSIS — I2510 Atherosclerotic heart disease of native coronary artery without angina pectoris: Secondary | ICD-10-CM

## 2021-01-07 HISTORY — DX: Biliary acute pancreatitis without necrosis or infection: K85.10

## 2021-01-07 LAB — CBC
HCT: 40.1 % (ref 39.0–52.0)
Hemoglobin: 12.8 g/dL — ABNORMAL LOW (ref 13.0–17.0)
MCH: 29.5 pg (ref 26.0–34.0)
MCHC: 31.9 g/dL (ref 30.0–36.0)
MCV: 92.4 fL (ref 80.0–100.0)
Platelets: 258 10*3/uL (ref 150–400)
RBC: 4.34 MIL/uL (ref 4.22–5.81)
RDW: 14.2 % (ref 11.5–15.5)
WBC: 8.3 10*3/uL (ref 4.0–10.5)
nRBC: 0 % (ref 0.0–0.2)

## 2021-01-07 LAB — COMPREHENSIVE METABOLIC PANEL
ALT: 30 U/L (ref 0–44)
AST: 25 U/L (ref 15–41)
Albumin: 2.8 g/dL — ABNORMAL LOW (ref 3.5–5.0)
Alkaline Phosphatase: 82 U/L (ref 38–126)
Anion gap: 8 (ref 5–15)
BUN: 8 mg/dL (ref 8–23)
CO2: 24 mmol/L (ref 22–32)
Calcium: 8.5 mg/dL — ABNORMAL LOW (ref 8.9–10.3)
Chloride: 106 mmol/L (ref 98–111)
Creatinine, Ser: 0.9 mg/dL (ref 0.61–1.24)
GFR, Estimated: >60 mL/min (ref >60)
Glucose, Bld: 159 mg/dL — ABNORMAL HIGH (ref 70–99)
Potassium: 3.7 mmol/L (ref 3.5–5.1)
Sodium: 138 mmol/L (ref 135–145)
Total Bilirubin: 1.2 mg/dL (ref 0.3–1.2)
Total Protein: 6.1 g/dL — ABNORMAL LOW (ref 6.5–8.1)

## 2021-01-07 NOTE — Consult Note (Signed)
Cardiology Consultation:   Patient ID: Robert [name] MRN: [id number]; DOB: 05/28/1946  Admit date: 01/04/2021 Date of Consult: 01/07/2021  PCP:  Robert [name], Robert [name]  Cardiologist: New to Robert [name] (remotely seen by Dr. Einar [name] once) wife like to follow by [facility]  Patient Profile:   Robert [name] is a 75 y.o. 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 Karleen [name].  Wife reported that patient was seen by Dr. Einar [name] remotely (does not remember why patient was seen).  He was advised to follow-up as needed.  History of Present Illness:   Robert [name] presented to ER 3/13 with vomiting, poor p.o. intake, weakness and fever.  He was found to have sepsis from UTI and acute pancreatitis.  Elevated lipase and LFTs.  Seen by surgery and felt probable gallstone pancreatitis.  Treated with broad-spectrum antibiotic.  Plan to take him to the OR for laparoscopic cholecystectomy with cholangiogram Thursday 3/17 after holding Eliquis for 2 days.  Cardiology is asked for surgical clearance.  Prior to admission patient was doing physical therapy 2 times per week.  Each session was 1 hour long.  He was tolerating PT well.  No reported exertional chest pain or shortness of breath.  LFTs and lipase are improving LDL 38 Creatinine normal Potassium 3.7 Leukocytosis resolved Hemoglobin 12.8  Past Medical History:  Diagnosis Date  . BPH (benign prostatic hyperplasia)   . CVA (cerebral vascular accident) ([place])    with left sided hemiparesis  . Diverticulosis   . Frequency of urination   . GERD (gastroesophageal reflux disease)   . Gross hematuria   . History of acute pyelonephritis     10-13-2012  . History of CVA with residual deficit    2008--  left side of body weakness and foot drop (wears leg brace and uses cane)  . History of DVT of lower extremity    2008--  cva  . Hypercholesteremia   . Hyperlipidemia   . Hyperlipidemia   . Hypertension   . 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 Left leg DVT ([place])   . 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 Urethral stricture   . Urgency of urination   . Urinary retention   . Weakness of left side of body    secondary to cva 2008  . Wears glasses   . Wears hearing aid    bilateral-- wears intermittantly    Past Surgical History:  Procedure Laterality Date  . CYSTOSCOPY WITH RETROGRADE URETHROGRAM N/A 10/23/2015   Procedure: CYSTOSCOPY WITH RETROGRADE URETHROGRAM;  Surgeon: [name], MD;  Location[facility];  Service: Urology;  Laterality: N/A;  . CYSTOSCOPY WITH URETHRAL DILATATION N/A 10/23/2015   Procedure: CYSTOSCOPY WITH URETHRAL BALLOON DILATATION;  Surgeon: [name], MD;  Location[facility];  Service: Urology;  Laterality: N/A;  BALLOON DILATION   . IVC FILTER PLACEMENT ([facility] HX)  2008    Inpatient Medications: Scheduled Meds: . busPIRone  7.5 mg Oral BID  . Carbidopa-Levodopa ER  1 tablet Oral QHS  . Carbidopa-Levodopa ER  2 tablet Oral BID  . Chlorhexidine Gluconate Cloth  6 each  Topical Daily  . losartan  50 mg Oral Daily  . pantoprazole  40 mg Oral Daily  . Pimavanserin Tartrate  34 mg Oral Daily  . rasagiline  1 mg Oral Daily  . simvastatin  20 mg Oral QPM  . tamsulosin  0.4 mg Oral Daily   Continuous Infusions: . sodium chloride 75 mL/hr at 01/06/21 2001  . cefTRIAXone (ROCEPHIN)  IV 1 g (01/06/21 2001)   PRN Meds: acetaminophen **OR** acetaminophen, albuterol, bisacodyl, ipratropium, magnesium citrate, morphine injection, ondansetron **OR** ondansetron (ZOFRAN) IV, polyethylene glycol, senna-docusate, traMADol,  traZODone  Allergies:    Allergies  Allergen Reactions  . Propofol Other (See Comments)    "hiccups for weeks"  . Lisinopril     Other reaction(s): Cough    Social History:   Social History   Socioeconomic History  . Marital status: Married    Spouse name: Robert [name]  . Number of children: 1  . Years of education: 45  . Highest education level: High school graduate  Occupational History  . Occupation: retired    Comment: Public librarian  Tobacco Use  . Smoking status: Former Smoker    Years: 10.00    Types: Cigarettes    Quit date: 10/26/1970    Years since quitting: 50.2  . Smokeless tobacco: Never Used  Vaping Use  . Vaping Use: Never used  Substance and Sexual Activity  . Alcohol use: No  . Drug use: No  . Sexual activity: Not on file  Other Topics Concern  . Not on file  Social History Narrative   Lives in [place] with wife. He is a English as a second language teacher.   Has one son who lives in [place]. He is healthy.   [facility] [place], [place].      Patient is right-handed. He lives with his wife in a one level home.   Social Determinants of Health   Financial Resource Strain: Medium Risk  . Difficulty of Paying Living Expenses: Somewhat hard  Food Insecurity: Food Insecurity Present  . Worried About Charity fundraiser in the Last Year: Often true  . Ran Out of Food in the Last Year: Often true  Transportation Needs: No Transportation Needs  . Lack of Transportation (Medical): No  . Lack of Transportation (Non-Medical): No  Physical Activity: Not on file  Stress: Not on file  Social Connections: Not on file  Intimate Partner Violence: Not on file    Family History:   Family History  Problem Relation Age of Onset  . Diabetes Sister   . Lung cancer Brother        Post 9/11 voluntary work in [place]  . Prostate cancer Brother   . Other Mother        passed away young- non medical  . Alzheimer's disease Father   . Healthy Son      ROS:  Please see  the history of present illness. All other ROS reviewed and negative.     Physical Exam/Data:   Vitals:   01/06/21 0425 01/06/21 1337 01/06/21 1952 01/07/21 0356  BP: 127/71 133/77 110/64 127/71  Pulse: 81 98 78 69  Resp: 20 16 20 20   Temp: 98.7 F (37.1 C) 99.1 F (37.3 C) 98.4 F (36.9 C) 98.6 F (37 C)  TempSrc: Oral Oral Oral Oral  SpO2: 97% 94% 95% 97%  Weight:      Height:        Intake/Output Summary (Last 24 hours) at 01/07/2021 1017 Last  data filed at 01/07/2021 0600 Gross per 24 hour  Intake -  Output 1100 ml  Net -1100 ml   Last 3 Weights 01/04/2021 11/09/2020 02/23/2020  Weight (lbs) 244 lb 240 lb 236 lb  Weight (kg) 110.678 kg 108.863 kg 107.049 kg     Body mass index is 31.33 kg/m.  General:  Well nourished, well developed, in no acute distress HEENT: normal Lymph: no adenopathy Neck: no JVD Endocrine:  No thryomegaly Vascular: No carotid bruits; FA pulses 2+ bilaterally without bruits  Cardiac:  normal S1, S2; RRR; no murmur  Lungs:  clear to auscultation bilaterally, no wheezing, rhonchi or rales  Abd: soft, nontender, no hepatomegaly  Ext: no edema Musculoskeletal:  No deformities Skin: warm and dry, pressure ulcer on back Neuro: Left-sided weakness Psych: Parkinson  EKG:  The EKG was personally reviewed and demonstrates: Normal sinus rhythm   Relevant CV Studies: As above  Laboratory Data:  High Sensitivity Troponin:  No results for input(s): TROPONINIHS in the last 720 hours.   Chemistry Recent Labs  Lab 01/05/21 0454 01/06/21 0336 01/07/21 0340  NA 133* 133* 138  K 4.0 3.7 3.7  CL 101 101 106  CO2 24 23 24   GLUCOSE 159* 150* 159*  BUN 8 8 8   CREATININE 0.91 0.79 0.90  CALCIUM 8.7* 8.5* 8.5*  GFRNONAA >60 >60 >60  ANIONGAP 8 9 8     Recent Labs  Lab 01/04/21 1710 01/05/21 0454 01/07/21 0340  PROT 7.1 6.8 6.1*  ALBUMIN 3.5 3.3* 2.8*  AST 138* 78* 25  ALT 68* 42 30  ALKPHOS 131* 118 82  BILITOT 2.9* 2.2* 1.2    Hematology Recent Labs  Lab 01/05/21 0454 01/06/21 0336 01/07/21 0340  WBC 17.9* 13.7* 8.3  RBC 4.98 4.46 4.34  HGB 14.6 13.2 12.8*  HCT 45.0 41.3 40.1  MCV 90.4 92.6 92.4  MCH 29.3 29.6 29.5  MCHC 32.4 32.0 31.9  RDW 13.9 14.2 14.2  PLT 280 258 258   BNP Recent Labs  Lab 01/04/21 1711  BNP 111.1*    DDimer No results for input(s): DDIMER in the last 168 hours.   Radiology/Studies:  CT Abdomen Pelvis W Contrast  Result Date: 01/04/2021 CLINICAL DATA:  Fever, vomiting EXAM: CT ABDOMEN AND PELVIS WITH CONTRAST TECHNIQUE: Multidetector CT imaging of the abdomen and pelvis was performed using the standard protocol following bolus administration of intravenous contrast. CONTRAST:  138mL OMNIPAQUE IOHEXOL 300 MG/ML  SOLN COMPARISON:  11/09/2020 FINDINGS: Lower chest: Coronary artery calcifications. Main pulmonary trunk measures 3.3 cm in diameter. No basilar airspace opacity. Hepatobiliary: There are numerous gallstones within the gallbladder lumen, many of which containing gas. Gallbladder is mildly distended. No definite pericholecystic inflammatory changes. Mild intrahepatic biliary dilatation. No focal liver lesion identified on unenhanced imaging. Pancreas: Peripancreatic fat stranding throughout the length of the pancreatic body and tail. No peripancreatic fluid collections. Spleen: Normal in size without focal abnormality. Adrenals/Urinary Tract: Unremarkable adrenal glands. Kidneys enhance symmetrically without suspicious lesion, stone, or hydronephrosis. Probable subcentimeter left renal cyst. Ureters are nondilated. Urinary bladder decompressed by Foley catheter. Small amount of fluid within known right-sided bladder diverticula. Stomach/Bowel: Stomach is within normal limits. Appendix appears normal (series 5, image 65). No evidence of bowel wall thickening, distention, or inflammatory changes. Mild-moderate volume of stool throughout the colon. Vascular/Lymphatic:  Atherosclerosis throughout the aortoiliac axis without aneurysm. IVC filter noted. No lymphadenopathy. Reproductive: Brachytherapy seeds within the prostate gland. Other: No free fluid. No abdominopelvic fluid collection. No pneumoperitoneum. No  abdominal wall hernia. Musculoskeletal: No acute osseous findings. Flowing anterior endplate osteophytes throughout the thoracic and lumbar spine suggesting diffuse idiopathic skeletal hyperostosis or possibly ankylosing spondylitis. Degenerative changes of the bilateral hips. Partial ankylosis of the sacroiliac joints. IMPRESSION: 1. Findings suggestive of mild acute pancreatitis. 2. Cholelithiasis with mild gallbladder distention. No definite pericholecystic inflammatory changes. If there is clinical concern for acute cholecystitis, a right upper quadrant ultrasound could be performed for further evaluation. 3. Mild intrahepatic biliary dilatation. 4. Mild-moderate volume of stool throughout the colon. 5. Mildly dilated main pulmonary trunk suggesting pulmonary arterial hypertension. 6. Aortic atherosclerosis (ICD10-I70.0). Electronically Signed   By: Davina Poke D.O.   On: 01/04/2021 19:26   DG Chest Port 1 View  Result Date: 01/04/2021 CLINICAL DATA:  Fever, vomiting EXAM: PORTABLE CHEST 1 VIEW COMPARISON:  11/27/2020 FINDINGS: The heart size and mediastinal contours are within normal limits. Both lungs are clear. The visualized skeletal structures are unremarkable. IMPRESSION: No active disease. Electronically Signed   By: Randa Ngo M.D.   On: 01/04/2021 17:59   US Abdomen Limited RUQ (LIVER/GB)  Result Date: 01/05/2021 CLINICAL DATA:  Acute pancreatitis EXAM: ULTRASOUND ABDOMEN LIMITED RIGHT UPPER QUADRANT COMPARISON:  None. FINDINGS: Gallbladder: Visualization limited by a shadowing from numerous gallstones. A negative sonographic Percell Miller sign was reported by the sonographer. No pericholecystic fluid or gallbladder wall thickening. Common bile duct:  Diameter: 4 mm Liver: No focal lesion identified. Within normal limits in parenchymal echogenicity. Portal vein is patent on color Doppler imaging with normal direction of blood flow towards the liver. Other: None. IMPRESSION: Cholelithiasis without other evidence of acute cholecystitis. Electronically Signed   By: Ulyses Jarred M.D.   On: 01/05/2021 00:37     Assessment and Plan:   1. Surgical clearance for laparoscopic cholecystectomy with cholangiogram Patient was doing physical therapy for 1 hour 2 times per week without cardiac symptoms.  Despite limited ambulation he was able to get at least 4 METS of activity.  History unreliable due to Parkinson disease.  EKG without acute ischemic changes.  2.  Hypertension -Blood pressure stable on losartan  3.  History of stroke secondary to DVT s/p IVC filter in 2008 -Eliquis on hold for surgery  Robert [name] to see.   Risk Assessment/Risk Scores:   For questions or updates, please contact [place] Please consult [email or web address] for contact info under    Jarrett Soho, PA  01/07/2021 10:17 AM

## 2021-01-07 NOTE — TOC Progression Note (Signed)
Transition of Care [facility]) - Progression Note    Patient Details  Name: Robert [name] MRN: [id number] Date of Birth: 06-Mar-1946  Transition of Care [facility]) CM/SW Contact  Ross Ludwig, [place] Phone Number: 01/07/2021, 4:33 PM  Clinical Narrative:     CSW was informed that patient and family are interested in SNF placement for short term rehab.  Patient is supposed to have surgery on Thursday, PT and OT will have to be ordered after surgery for reassessment.  CSW can then work on trying to find SNF placement.  CSW to continue to follow patient's progress throughout discharge planning.        Expected Discharge Plan and Services                                                 Social Determinants of Health (SDOH) Interventions    Readmission Risk Interventions Readmission Risk Prevention Plan 08/16/2019  Post Dischage Appt Not Complete  Appt Comments disposition tbd  Medication Screening Complete  Transportation Screening Complete  Some recent data might be hidden

## 2021-01-07 NOTE — Progress Notes (Signed)
PROGRESS NOTE    Robert [name]  FYT:[id number] DOB: 10/04/1946 DOA: 01/04/2021 PCP: [name], MD    Chief Complaint  Patient presents with  . Vomiting    Brief Narrative:   75 y.o. 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  Patient was in a usual state of health when he developed fever to T-max of 100.9, . On arrival to ED, he was found to have acute pancreatitis and sepsis from UTI.  CT of the abdomen with contrast shows numerous gallstones within the gallbladder lumen, mildly distended, no pericholecystic inflammatory changes.  Mild intrahepatic biliary dilatation.  No peripancreatic fluid collections right upper quadrant ultrasound showed numerous gallstones with no pericholecystic fluid or gallbladder wall thickening.  His abdominal pain, nausea and vomiting have resolved and his pancreatic enzymes have normalized.  He was started on clear liquid diet advance to full liquid diet.  Surgery consulted for possible/probable gallstone pancreatitis. General surgery to take the patient for lap cholecystectomy on Thursday requested cardiology clearance. Cardiology consulted, vascular surgery 36.6 g based on the revised cardiac risk index.  Echocardiogram ordered.  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   his alk phos and liver enzymes were elevated on admission.  elevated lipase on admit 279, improved to 63 to 36 .  Liver enzymes improved and alk phos normalized.  CT abd pelvis showed gall stones , no CBD dilatation.  [place] abd does not show acute cholecystitis, multiple gallstones in the lumen, no pericholecystic fluid or gallbladder wall thickening. General surgery consultation for probable gallstone pancreatitis. General surgery to take the patient for lap cholecystectomy on Thursday requested cardiology clearance. Started the patient on clears and advance as tolerated.     Sepsis sec to UTI:  Sepsis physiology has improved, lactic acid is 1.  WBC count has normalized Patient has chronic Foley catheter which was replaced this admission.  Unfortunately urine cultures were not obtained on admission. Continue with IV rocephin.    Mild hyponatremia Probably secondary to dehydration Sodium level today is normal   BPH Sec to flomax.    Leukocytosis with left shift:  From UTI.,  Resolved   H/o cva with left hemiparesis PT evaluation ordered recommending SNF on discharge, TOC on board Continue with home meds.    Hypertension:  Blood pressure parameters appear to be optimal   Hyperlipidemia Resume statin.   Parkinson's disease:  Resume home meds.   History of DVT S/p IVC filter and on Eliquis for anticoagulation.  PRESSURE injury on admission  Pressure Injury 01/05/21 Right Stage 2 -  Partial thickness loss of dermis presenting as a shallow open injury with a red, pink wound bed without slough. (Active)  01/05/21 0500  Location:   Location Orientation: Right  Staging: Stage 2 -  Partial thickness loss of  dermis presenting as a shallow open injury with a red, pink wound bed without slough.  Wound Description (Comments):   Present on Admission: Yes   Wound care consulted and recommendations given.   History of CVA in the past Continue with statin and hold  Eliquis.   DVT prophylaxis: SCDs Code Status: full code.  Family Communication: family at bedside.  Disposition:   Status is: Inpatient  Remains inpatient appropriate because:Ongoing diagnostic testing needed not appropriate for outpatient work up, Unsafe d/c plan and IV treatments appropriate due to intensity of illness or inability to take PO   Dispo: The patient is from: Home              Anticipated d/c is to: SNF              Patient currently is not medically stable to d/c.   Difficult to place patient No       Consultants:   General surgery  Cardiology  Neurology   Procedures: Echocardiogram  Scheduled for lap chole on Thursday   Antimicrobials: Antibiotics Given (last 72 hours)    Date/Time Action Medication Dose Rate   01/04/21 1952 New Bag/Given   cefTRIAXone (ROCEPHIN) 1 g in sodium chloride 0.9 % 100 mL IVPB 1 g 200 mL/hr   01/05/21 1958 New Bag/Given   cefTRIAXone (ROCEPHIN) 1 g in sodium chloride 0.9 % 100 mL IVPB 1 g 200 mL/hr   01/06/21 2001 New Bag/Given   cefTRIAXone (ROCEPHIN) 1 g in sodium chloride 0.9 % 100 mL IVPB 1 g 200 mL/hr         Subjective: Patient is alert, afebrile, appears comfortable. Patient denies any nausea vomiting abdominal pain.  Objective: Vitals:   01/06/21 0425 01/06/21 1337 01/06/21 1952 01/07/21 0356  BP: 127/71 133/77 110/64 127/71  Pulse: 81 98 78 69  Resp: $Remo'20 16 20 20  'WPked$ Temp: 98.7 F (37.1 C) 99.1 F (37.3 C) 98.4 F (36.9 C) 98.6 F (37 C)  TempSrc: Oral Oral Oral Oral  SpO2: 97% 94% 95% 97%  Weight:      Height:        Intake/Output Summary (Last 24 hours) at 01/07/2021 1313 Last data filed at 01/07/2021 0600 Gross per 24 hour  Intake --  Output 1100 ml  Net -1100 ml   Filed Weights   01/04/21 1708  Weight: 110.7 kg    Examination:  General exam: Alert and comfortable, not in any kind of distress Respiratory system: Clear to auscultation bilaterally no wheezing or  rhonchi Cardiovascular system: S1-S2 heard, regular rate rhythm, no JVD Gastrointestinal system: Abdomen is soft, nontender, nondistended, bowel sounds normal Central nervous system: Alert and oriented to place and person not following commands Extremities: No edema or deformity Skin: Stage II pressure injury present on the back Psychiatry: Mood is appropriate    Data Reviewed: I have personally reviewed following labs and imaging studies  CBC: Recent Labs  Lab 01/04/21 1710 01/05/21 0454 01/06/21 0336 01/07/21 0340  WBC 13.3* 17.9* 13.7* 8.3  NEUTROABS 10.8*  --  10.5*  --   HGB 14.9 14.6 13.2 12.8*  HCT 46.2 45.0 41.3 40.1  MCV 91.8 90.4 92.6 92.4  PLT 270 280 258 350    Basic Metabolic Panel: Recent Labs  Lab 01/04/21 1710 01/05/21 0454 01/06/21 0336 01/07/21 0340  NA 133* 133* 133* 138  K 3.9 4.0 3.7 3.7  CL 99 101 101 106  CO2 21* $Remov'24 23 24  'ECRJIf$ GLUCOSE 196* 159* 150*  159*  BUN $Re'8 8 8 8  'Och$ CREATININE 0.93 0.91 0.79 0.90  CALCIUM 8.9 8.7* 8.5* 8.5*  MG  --   --  1.9  --     GFR: Estimated Creatinine Clearance: 96.8 mL/min (by C-G formula based on SCr of 0.9 mg/dL).  Liver Function Tests: Recent Labs  Lab 01/04/21 1710 01/05/21 0454 01/07/21 0340  AST 138* 78* 25  ALT 68* 42 30  ALKPHOS 131* 118 82  BILITOT 2.9* 2.2* 1.2  PROT 7.1 6.8 6.1*  ALBUMIN 3.5 3.3* 2.8*    CBG: Recent Labs  Lab 01/04/21 1710  GLUCAP 179*     Recent Results (from the past 240 hour(s))  SARS CORONAVIRUS 2 (TAT 6-24 HRS) Nasopharyngeal Nasopharyngeal Swab     Status: None   Collection Time: 01/04/21  7:32 PM   Specimen: Nasopharyngeal Swab  Result Value Ref Range Status   SARS Coronavirus 2 NEGATIVE NEGATIVE Final    Comment: (NOTE) SARS-CoV-2 target nucleic acids are NOT DETECTED.  The SARS-CoV-2 RNA is generally detectable in upper and lower respiratory specimens during the acute phase of infection. Negative results do not preclude SARS-CoV-2 infection, do not rule  out co-infections with other pathogens, and should not be used as the sole basis for treatment or other patient management decisions. Negative results must be combined with clinical observations, patient history, and epidemiological information. The expected result is Negative.  Fact Sheet for Patients: SugarRoll.be  Fact Sheet for Healthcare Providers: https://www.woods-mathews.com/  This test is not yet approved or cleared by the [place] FDA and  has been authorized for detection and/or diagnosis of SARS-CoV-2 by FDA under an Emergency Use Authorization (EUA). This EUA will remain  in effect (meaning this test can be used) for the duration of the COVID-19 declaration under Se ction 564(b)(1) of the Act, 21 U.S.C. section 360bbb-3(b)(1), unless the authorization is terminated or revoked sooner.  Performed at [facility], [place] [address], [place],  [address]          Radiology Studies: No results found.      Scheduled Meds: . busPIRone  7.5 mg Oral BID  . Carbidopa-Levodopa ER  1 tablet Oral QHS  . Carbidopa-Levodopa ER  2 tablet Oral BID  . Chlorhexidine Gluconate Cloth  6 each Topical Daily  . losartan  50 mg Oral Daily  . pantoprazole  40 mg Oral Daily  . Pimavanserin Tartrate  34 mg Oral Daily  . rasagiline  1 mg Oral Daily  . simvastatin  20 mg Oral QPM  . tamsulosin  0.4 mg Oral Daily   Continuous Infusions: . sodium chloride 75 mL/hr at 01/07/21 1103  . cefTRIAXone (ROCEPHIN)  IV 1 g (01/06/21 2001)     LOS: 2 days        Hosie Poisson, MD [facility]   To contact the attending provider between 7A-7P or the covering provider during after hours 7P-7A, please log into the web site [email or web address] and access using universal [place] password for that web site. If you do not have the password, please call the hospital operator.  01/07/2021, 1:13 PM

## 2021-01-07 NOTE — Progress Notes (Signed)
PMT consult received and chart reviewed. Briefly met with patient at bedside. He is in good spirits. No complaints. He has a good understanding of plan of care including plan for ECHO in AM and hopefully surgery on Thursday. His wife is not at bedside this afternoon. Spoke with wife via telephone. She plans to spend the night and available to speak with NP in AM. This NP will plan to f/u with patient and wife in AM.  Thank you.   NO CHARGE  Ihor Dow, [place], FNP-C Palliative Medicine Team  Phone: [phone] Fax: [phone]

## 2021-01-07 NOTE — Progress Notes (Signed)
Robert [name] [id number] January 31, 1946  CARE TEAM:  PCP: Robert Pepper, MD  Outpatient Care Team: Patient Care Team: Robert Pepper, MD as PCP - General (Family Medicine) Pleasant, Robert Gibson, RN as Duck Management Robert [name], Robert Quail, DO as Consulting Physician (Neurology) Robert Prows, MD as Consulting Physician (Cardiology)  Inpatient Treatment Team: Treatment Team: Attending Provider: Hosie Poisson, MD; Rounding Team: Robert Beards, MD; Rounding Team: Robert Pace, Md, MD; Consulting Physician: Robert Hughs, MD; Utilization Review: Robert Bradford, RN; Technician: Robert Presto, RN; Registered Nurse: Robert Muir, RN; Utilization Review: Robert [name], Robert Spray, RN   Problem List:   Active Problems:   Sepsis secondary to UTI ([place])   Pressure injury of skin      * No surgery found *      Assessment  Nausea vomiting and abdominal pain that seems postprandial and not totally explained by chronic UTI/urosepsis.  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  I do think his diagnosis of gallstone pancreatits seems legitimate.  Liver function tests and lipase appear to be resolving.  I think he would benefit from cholecystectomy with cholangiogram..  Would hold Eliquis 48 hours.  = Thursday a.m.  Reasonable for minimal invasive laparoscopic approach.  Patient's risks of the morning will be are elevated given his history of stroke and other comorbidities but he is  relatively stable.  Nonetheless, given the history of stroke & other health issues, request cardiac clearance.  [place] [facility] aware and they will help assess.  Need for echocardiogram, etc.  The anatomy & physiology of hepatobiliary & pancreatic function was discussed.  The pathophysiology of gallbladder dysfunction was discussed.  Natural history risks without surgery was discussed.   I feel the risks of no intervention will lead to serious problems that outweigh the operative risks; therefore, I recommended cholecystectomy to remove the pathology.  I explained laparoscopic techniques with possible need for an open approach.  Probable cholangiogram to evaluate the bilary tract was explained as well.    Risks such as bleeding, infection, diarrhea and other bowel changes, abscess, leak, injury to other organs, need for repair of tissues / organs, need for further treatment, stroke, heart attack, death, and other risks were discussed.  I noted a good likelihood this will help address the problem, but there is a chance it may not help.  Possibility that this will not correct all abdominal symptoms was explained.  Goals of post-operative recovery were discussed as well.  We will work to minimize complications.  An educational handout further explaining the pathology and treatment options was given as well.  Questions were answered.  The patient & his wife, Robert [name] expresses understanding & wish to proceed with surgery.        FEN: Full liquids ID: Rocephin 3/12>>  for urinary tract infection and possible cholecystitis DVT: Eliquis - hold for surgery Follow-up: TBD      25 minutes spent in review, evaluation, examination, counseling, and coordination of care.   I have reviewed this patient's available data, including medical history, events of  note, physical examination and test results as part of my evaluation.  A significant portion of that time was spent in counseling.  Care  during the described time interval was provided by me.  01/07/2021    Subjective: (Chief complaint)  Patient up in bed.  Wife in room.  Tolerating some liquids but still has upper abdominal discomfort.  Seen by urology.  Catheter just exchanged.  Continue protocol Q 4 weeks exchange minimum.  Objective:  Vital signs:  Vitals:   01/06/21 0425 01/06/21 1337 01/06/21 1952 01/07/21 0356  BP: 127/71 133/77 110/64 127/71  Pulse: 81 98 78 69  Resp: 20 16 20 20   Temp: 98.7 F (37.1 C) 99.1 F (37.3 C) 98.4 F (36.9 C) 98.6 F (37 C)  TempSrc: Oral Oral Oral Oral  SpO2: 97% 94% 95% 97%  Weight:      Height:        Last BM Date: 01/05/21  Intake/Output   Yesterday:  03/14 0701 - 03/15 0700 In: -  Out: 1100 [Urine:1100] This shift:  No intake/output data recorded.  Bowel function:  Flatus: YES  BM:  No  Drain: (No drain)   Physical Exam:  General: Pt awake/alert in no acute distress Eyes: PERRL, normal EOM.  Sclera clear.  No icterus Neuro: CN II-XII intact.  Mentally alert and smiling.  Still with dense left hemiparesisLymph: No head/neck/groin lymphadenopathy Psych:  No delerium/psychosis/paranoia.  Oriented x 4 HENT: Normocephalic, Mucus membranes moist.  No thrush Neck: Supple, No tracheal deviation.  No obvious thyromegaly Chest: No pain to chest wall compression.  Good respiratory excursion.  No audible wheezing CV:  Pulses intact.  Regular rhythm.  No major extremity edema MS: Normal AROM mjr joints.  No obvious deformity  Abdomen: Soft.  Mildy distended.  Tenderness at Epigastric and right upper quadrant region relatively mild without Murphy sign.  No evidence of peritonitis.  No incarcerated hernias.  Ext:  Stable edema.  LLE splint.  No cyanosis Skin: No petechiae / purpurea.  No major sores.  Warm and dry    Results:   Cultures: Recent Results (from the past 720 hour(s))  SARS CORONAVIRUS 2 (Robert [name] 6-24 HRS) Nasopharyngeal Nasopharyngeal Swab      Status: None   Collection Time: 01/04/21  7:32 PM   Specimen: Nasopharyngeal Swab  Result Value Ref Range Status   SARS Coronavirus 2 NEGATIVE NEGATIVE Final    Comment: (NOTE) SARS-CoV-2 target nucleic acids are NOT DETECTED.  The SARS-CoV-2 RNA is generally detectable in upper and lower respiratory specimens during the acute phase of infection. Negative results do not preclude SARS-CoV-2 infection, do not rule out co-infections with other pathogens, and should not be used as the sole basis for treatment or other patient management decisions. Negative results must be combined with clinical observations, patient history, and epidemiological information. The expected result is Negative.  Fact Sheet for Patients: SugarRoll.be  Fact Sheet for Healthcare Providers: https://www.woods-mathews.com/  This test is not yet approved or cleared by the [place] FDA and  has been authorized for detection and/or diagnosis of SARS-CoV-2 by FDA under an Emergency Use Authorization (EUA). This EUA will remain  in effect (meaning this test can be used) for the duration of the COVID-19 declaration under Se ction 564(b)(1) of the Act, 21 U.S.C. section 360bbb-3(b)(1), unless the authorization is terminated or revoked sooner.  Performed at [facility], [place] [address] [place], [place] [address]     Labs: Results for orders placed or  performed during the hospital encounter of 01/04/21 (from the past 48 hour(s))  Basic metabolic panel     Status: Abnormal   Collection Time: 01/06/21  3:36 AM  Result Value Ref Range   Sodium 133 (L) 135 - 145 mmol/L   Potassium 3.7 3.5 - 5.1 mmol/L   Chloride 101 98 - 111 mmol/L   CO2 23 22 - 32 mmol/L   Glucose, Bld 150 (H) 70 - 99 mg/dL    Comment: Glucose reference range applies only to samples taken after fasting for at least 8 hours.   BUN 8 8 - 23 mg/dL   Creatinine, Ser 0.79 0.61 - 1.24 mg/dL    Calcium 8.5 (L) 8.9 - 10.3 mg/dL   GFR, Estimated >60 >60 mL/min    Comment: (NOTE) Calculated using the CKD-EPI Creatinine Equation (2021)    Anion gap 9 5 - 15    Comment: Performed at [facility], [place] [address], [place], [place] [address]  Lipase, blood     Status: None   Collection Time: 01/06/21  3:36 AM  Result Value Ref Range   Lipase 36 11 - 51 U/L    Comment: Performed at [facility], [place] [address] [place], [place] [address]  Magnesium     Status: None   Collection Time: 01/06/21  3:36 AM  Result Value Ref Range   Magnesium 1.9 1.7 - 2.4 mg/dL    Comment: Performed at [facility], [place] [address], [place], [place] [address]  CBC with Differential/Platelet     Status: Abnormal   Collection Time: 01/06/21  3:36 AM  Result Value Ref Range   WBC 13.7 (H) 4.0 - 10.5 K/uL   RBC 4.46 4.22 - 5.81 MIL/uL   Hemoglobin 13.2 13.0 - 17.0 g/dL   HCT 41.3 39.0 - 52.0 %   MCV 92.6 80.0 - 100.0 fL   MCH 29.6 26.0 - 34.0 pg   MCHC 32.0 30.0 - 36.0 g/dL   RDW 14.2 11.5 - 15.5 %   Platelets 258 150 - 400 K/uL   nRBC 0.0 0.0 - 0.2 %   Neutrophils Relative % 77 %   Neutro Abs 10.5 (H) 1.7 - 7.7 K/uL   Lymphocytes Relative 7 %   Lymphs Abs 1.0 0.7 - 4.0 K/uL   Monocytes Relative 12 %   Monocytes Absolute 1.6 (H) 0.1 - 1.0 K/uL   Eosinophils Relative 4 %   Eosinophils Absolute 0.5 0.0 - 0.5 K/uL   Basophils Relative 0 %   Basophils Absolute 0.1 0.0 - 0.1 K/uL   Immature Granulocytes 0 %   Abs Immature Granulocytes 0.05 0.00 - 0.07 K/uL    Comment: Performed at [facility], [place] [address], [place], [place] [address]  CBC     Status: Abnormal   Collection Time: 01/07/21  3:40 AM  Result Value Ref Range   WBC 8.3 4.0 - 10.5 K/uL   RBC 4.34 4.22 - 5.81 MIL/uL   Hemoglobin 12.8 (L) 13.0 - 17.0 g/dL   HCT 40.1 39.0 - 52.0 %   MCV 92.4 80.0 - 100.0 fL   MCH 29.5 26.0 - 34.0 pg   MCHC 31.9 30.0 - 36.0 g/dL    RDW 14.2 11.5 - 15.5 %   Platelets 258 150 - 400 K/uL   nRBC 0.0 0.0 - 0.2 %    Comment: Performed at [facility], [place] [address], [place], [place] [address]  Comprehensive metabolic  panel     Status: Abnormal   Collection Time: 01/07/21  3:40 AM  Result Value Ref Range   Sodium 138 135 - 145 mmol/L   Potassium 3.7 3.5 - 5.1 mmol/L   Chloride 106 98 - 111 mmol/L   CO2 24 22 - 32 mmol/L   Glucose, Bld 159 (H) 70 - 99 mg/dL    Comment: Glucose reference range applies only to samples taken after fasting for at least 8 hours.   BUN 8 8 - 23 mg/dL   Creatinine, Ser 0.90 0.61 - 1.24 mg/dL   Calcium 8.5 (L) 8.9 - 10.3 mg/dL   Total Protein 6.1 (L) 6.5 - 8.1 g/dL   Albumin 2.8 (L) 3.5 - 5.0 g/dL   AST 25 15 - 41 U/L   ALT 30 0 - 44 U/L   Alkaline Phosphatase 82 38 - 126 U/L   Total Bilirubin 1.2 0.3 - 1.2 mg/dL   GFR, Estimated >60 >60 mL/min    Comment: (NOTE) Calculated using the CKD-EPI Creatinine Equation (2021)    Anion gap 8 5 - 15    Comment: Performed at [facility], [place] [address] [place], [place] [address]    Imaging / Studies: No results found.  Medications / Allergies: per chart  Antibiotics: Anti-infectives (From admission, onward)   Start     Dose/Rate Route Frequency Ordered Stop   01/05/21 2000  cefTRIAXone (ROCEPHIN) 1 g in sodium chloride 0.9 % 100 mL IVPB        1 g 200 mL/hr over 30 Minutes Intravenous Every 24 hours 01/05/21 0429     01/04/21 1900  cefTRIAXone (ROCEPHIN) 1 g in sodium chloride 0.9 % 100 mL IVPB        1 g 200 mL/hr over 30 Minutes Intravenous  Once 01/04/21 1857 01/04/21 2022        Note: Portions of this report may have been transcribed using voice recognition software. Every effort was made to ensure accuracy; however, inadvertent computerized transcription errors may be present.   Any transcriptional errors that result from this process are unintentional.    Adin Hector, MD, FACS,  MASCRS  Gastrointestinal and Minimally Invasive Surgery  [facility] [address], [place], [place] [address] [phone] Fax [phone] Main/Paging  CONTACT INFORMATION: Weekday (9AM-5PM) concerns: Call [facility] main office at [phone] Weeknight (5PM-9AM) or Weekend/Holiday concerns: Check [email or web address] for General Surgery [facility] coverage (Please, do not use SecureChat as it is not reliable communication to operating surgeons for immediate patient care)      01/07/2021  9:16 AM

## 2021-01-08 ENCOUNTER — Inpatient Hospital Stay (HOSPITAL_COMMUNITY): Payer: Medicare Other

## 2021-01-08 DIAGNOSIS — Z8673 Personal history of transient ischemic attack (TIA), and cerebral infarction without residual deficits: Secondary | ICD-10-CM

## 2021-01-08 DIAGNOSIS — Z01810 Encounter for preprocedural cardiovascular examination: Secondary | ICD-10-CM | POA: Diagnosis not present

## 2021-01-08 DIAGNOSIS — I6930 Unspecified sequelae of cerebral infarction: Secondary | ICD-10-CM

## 2021-01-08 DIAGNOSIS — Z7189 Other specified counseling: Secondary | ICD-10-CM

## 2021-01-08 DIAGNOSIS — K851 Biliary acute pancreatitis without necrosis or infection: Secondary | ICD-10-CM

## 2021-01-08 DIAGNOSIS — Z978 Presence of other specified devices: Secondary | ICD-10-CM

## 2021-01-08 DIAGNOSIS — K859 Acute pancreatitis without necrosis or infection, unspecified: Secondary | ICD-10-CM

## 2021-01-08 DIAGNOSIS — G20 Parkinson's disease: Secondary | ICD-10-CM

## 2021-01-08 DIAGNOSIS — Z515 Encounter for palliative care: Secondary | ICD-10-CM

## 2021-01-08 LAB — ECHOCARDIOGRAM COMPLETE
Area-P 1/2: 2.6 cm2
Height: 74 in
S' Lateral: 3.1 cm
Weight: 3904 oz

## 2021-01-08 NOTE — Consult Note (Signed)
Consultation Note Date: 01/08/2021   Patient Name: Robert [name]  DOB: August 18, 1946  MRN: [id number]  Age / Sex: 75 y.o., male  PCP: Robert Pepper, MD Referring Physician: Oswald Hillock, MD  Reason for Consultation: Establishing goals of care  HPI/Patient Profile: 75 y.o. male  with past medical history of CVA left hemiparesis, GERD, DVT with IVC on Eliquis, HTN, HLD, BPH with chronic foley, Parkinson's disease admitted on 01/04/2021 with fever. Hospital admission for acute pancreatitis, probable gallstone pancreatitis and sepsis secondary to UTI. Surgery following with plans for lap cholecystectomy on Thursday 3/17. ECHO pending for cardiology clearance. PT recommending SNF rehab on discharge. Palliative medicine consultation for goals of care.     Clinical Assessment and Goals of Care:  I have reviewed medical records, discussed with care team, and met with patient and wife Robert [name]) at bedside to discuss goals of care. Patient is awake, alert, oriented and in good spirits this morning. Denies pain or complaints.   I introduced Palliative Medicine as specialized medical care for people living with serious illness. It focuses on providing relief from the symptoms and stress of a serious illness. The goal is to improve quality of life for both the patient and the family.  We discussed a brief life review of the patient. Married. One son who lives in [place]. Prior to hospitalization, living home with wife who is his primary caregiver. He attends outpatient physical therapy twice a week and is very fond of his therapist.  Discussed course of hospitalization including plan of care. Wife has a good understanding of his condition and plan. They are hopeful for surgery on Thursday and understand need for rehab following this hospitalization.   I attempted to elicit values and goals of care important to the patient and  wife. Advanced directives, concepts specific to code status were discussed. Reviewed and confirmed patient's previously documented AD/living will located in EMR. Robert [name] is documented primary HCPOA and secondary is son Robert [name]. Patient has clear wishes for his desire for resuscitation to be attempted. He has initialed that he would not desire his life be prolonged by life prolonging measures if terminal or incurable. Will need ongoing discussions as time goes on. We did discuss the importance of ongoing discussions regarding his thoughts/wishes with irreversible Parkinson's and disease trajectory that will eventually lead to ongoing decline.     Support provided. Questions answered. PMT contact information given.     SUMMARY OF RECOMMENDATIONS    Full code/full scope treatment.  Surgery following. Plan for lap chole on 3/17 pending cardiology clearance and ECHO today.   Patient has completed AD packet with HCPOA and living will documentation. He does desire CPR be attempted ("CPR a must" quoted in documentation). Encouraged patient/wife to have ongoing discussions regarding his goals/wishes as time goes on with a chronic, irreversible condition (Parkinson's).   PT attempts after stable from surgery. Will likely need SNF rehab. SW following.   Code Status/Advance Care Planning:  Full code  Symptom Management:   Per attending  Palliative  Prophylaxis:   Bowel Regimen, Delirium Protocol, Frequent Pain Assessment, Oral Care and Turn Reposition  Additional Recommendations (Limitations, Scope, Preferences):  Full Scope Treatment  Psycho-social/Spiritual:   Desire for further Chaplaincy support:yes  Additional Recommendations: Caregiving  Support/Resources  Prognosis:   Unable to determine  Discharge Planning: Likely SNF rehab     Primary Diagnoses: Present on Admission: . Sepsis secondary to UTI ([place]) . Acute lower UTI . Elevated LFTs . HTN (hypertension)   I have reviewed  the medical record, interviewed the patient and family, and examined the patient. The following aspects are pertinent.  Past Medical History:  Diagnosis Date  . BPH (benign prostatic hyperplasia)   . CVA (cerebral vascular accident) ([place])    with left sided hemiparesis  . Diverticulosis   . Frequency of urination   . GERD (gastroesophageal reflux disease)   . Gross hematuria   . History of acute pyelonephritis    10-13-2012  . History of CVA with residual deficit    2008--  left side of body weakness and foot drop (wears leg brace and uses cane)  . History of DVT of lower extremity    2008--  cva  . Hypercholesteremia   . Hyperlipidemia   . Hyperlipidemia   . Hypertension   . 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 Left leg DVT ([place])   . 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 Urethral stricture   . Urgency of urination   . Urinary retention   . Weakness of left side of body    secondary to cva 2008  . Wears glasses   . Wears hearing aid    bilateral-- wears intermittantly   Social History   Socioeconomic History  . Marital status: Married    Spouse name: Robert [name]  . Number of children: 1  . Years of education: 32  . Highest education level: High school graduate  Occupational History  . Occupation: retired    Comment: Public librarian  Tobacco Use  . Smoking status: Former Smoker    Years: 10.00    Types: Cigarettes    Quit date: 10/26/1970    Years since quitting: 50.2  . Smokeless tobacco: Never Used  Vaping Use  . Vaping Use: Never used  Substance and Sexual Activity  . Alcohol use: No  . Drug use: No  . Sexual activity: Not on file  Other Topics Concern  . Not on file  Social History Narrative   Lives in [place] with wife. He is a English as a second language teacher.   Has one son who lives in [place]. He is healthy.   [facility] [place] [place]      Patient is right-handed. He lives with his wife in a one level home.    Social Determinants of Health   Financial Resource Strain: Medium Risk  . Difficulty of Paying Living Expenses: Somewhat hard  Food Insecurity: Food Insecurity Present  . Worried About Charity fundraiser in the Last Year: Often true  . Ran Out of Food in the Last Year: Often true  Transportation Needs: No Transportation Needs  . Lack of Transportation (Medical): No  . Lack of Transportation (Non-Medical): No  Physical Activity: Not on file  Stress: Not on file  Social Connections: Not on file   Family History  Problem Relation Age of Onset  . Diabetes Sister   . Lung cancer Brother  Post 9/11 voluntary work in [place]  . Prostate cancer Brother   . Other Mother        passed away young- non medical  . Alzheimer's disease Father   . Healthy Son    Scheduled Meds: . busPIRone  7.5 mg Oral BID  . Carbidopa-Levodopa ER  1 tablet Oral QHS  . Carbidopa-Levodopa ER  2 tablet Oral BID  . Chlorhexidine Gluconate Cloth  6 each Topical Daily  . losartan  50 mg Oral Daily  . pantoprazole  40 mg Oral Daily  . Pimavanserin Tartrate  34 mg Oral Daily  . rasagiline  1 mg Oral Daily  . simvastatin  20 mg Oral QPM  . tamsulosin  0.4 mg Oral Daily   Continuous Infusions: . sodium chloride 75 mL/hr at 01/08/21 1431  . cefTRIAXone (ROCEPHIN)  IV 1 g (01/07/21 2115)   PRN Meds:.acetaminophen **OR** acetaminophen, albuterol, bisacodyl, ipratropium, magnesium citrate, morphine injection, ondansetron **OR** ondansetron (ZOFRAN) IV, polyethylene glycol, senna-docusate, traMADol, traZODone Medications Prior to Admission:  Prior to Admission medications   Medication Sig Start Date End Date Taking? Authorizing Provider  acetaminophen (TYLENOL) 500 MG tablet Take 1,000 mg by mouth every 6 (six) hours as needed for moderate pain.   Yes [provider]  apixaban (ELIQUIS) 5 MG TABS tablet Take 1 tablet (5 mg total) by mouth 2 (two) times daily. 08/22/19 02/23/20 Yes Donne Hazel, MD   busPIRone (BUSPAR) 7.5 MG tablet Take 7.5 mg by mouth 2 (two) times daily.   Yes [provider]  Carbidopa-Levodopa ER (SINEMET CR) 25-100 MG tablet controlled release TAKE TWO TABLETS BY MOUTH THREE TIMES DAILY  Patient taking differently: Take 1-2 tablets by mouth See admin instructions. Takes 2 tablets in the morning, 2 tablets in the afternoon, and 1 tablet at night 03/05/20  Yes Tat, Rebecca S, DO  loratadine (CLARITIN) 10 MG tablet Take 10 mg by mouth daily.   Yes [provider]  losartan (COZAAR) 50 MG tablet Take 50 mg by mouth daily.   Yes [provider]  omeprazole (PRILOSEC) 20 MG capsule Take 20 mg by mouth daily as needed (heartburn).   Yes [provider]  Pimavanserin Tartrate (NUPLAZID) 34 MG CAPS Take 34 mg by mouth daily.   Yes [provider]  polyethylene glycol (MIRALAX) packet Take 17 g by mouth daily. Patient taking differently: Take 17 g by mouth daily as needed for mild constipation. 05/08/17  Yes Mackuen, Courteney Lyn, MD  rasagiline (AZILECT) 1 MG TABS tablet Take 1 mg by mouth daily.   Yes [provider]  Simethicone 250 MG CAPS Take 250 mg by mouth daily as needed (bloating).   Yes [provider]  simvastatin (ZOCOR) 20 MG tablet Take 20 mg by mouth every evening.   Yes [provider]  tamsulosin (FLOMAX) 0.4 MG CAPS capsule Take 0.4 mg by mouth daily.   Yes [provider]  lidocaine (LIDODERM) 5 % Place 1 patch onto the skin daily. Remove & Discard patch within 12 hours or as directed by MD Patient not taking: Reported on 01/04/2021 02/23/20   Dorie Rank, MD   Allergies  Allergen Reactions  . Propofol Other (See Comments)    "hiccups for weeks"  . Lisinopril     Other reaction(s): Cough   Review of Systems  All other systems reviewed and are negative.  Physical Exam Vitals and nursing note reviewed.  Constitutional:      General: He is awake.  Pulmonary:     Effort: No  tachypnea, accessory muscle usage or respiratory distress.  Skin:    General: Skin is warm and dry.  Neurological:     Mental Status: He is alert and oriented to person, place, and time.    Vital Signs: BP (!) 142/89 (BP Location: Right Arm)   Pulse 84   Temp 98.1 F (36.7 C) (Oral)   Resp 20   Ht _0  (1.88 m)   Wt 110.7 kg   SpO2 98%   BMI 31.33 kg/m  Pain Scale: 0-10   Pain Score: 0-No pain   SpO2: SpO2: 98 % O2 Device:SpO2: 98 % O2 Flow Rate: .   IO: Intake/output summary:   Intake/Output Summary (Last 24 hours) at 01/08/2021 1703 Last data filed at 01/08/2021 1446 Gross per 24 hour  Intake 1062 ml  Output 325 ml  Net 737 ml    LBM: Last BM Date: 01/07/21 Baseline Weight: Weight: 110.7 kg Most recent weight: Weight: 110.7 kg     Palliative Assessment/Data: PPS 50%   Flowsheet Rows   Flowsheet Row Most Recent Value  Intake Tab   Referral Department Hospitalist  Unit at Time of Referral Med/Surg Unit  Palliative Care Primary Diagnosis Other (Comment)  Palliative Care Type New Palliative care  Reason for referral Clarify Goals of Care  Date first seen by Palliative Care 01/07/21  Clinical Assessment   Palliative Performance Scale Score 50%  Psychosocial & Spiritual Assessment   Palliative Care Outcomes   Patient/Family meeting held? Yes  Who was at the meeting? patient and wife  Palliative Care Outcomes Clarified goals of care, Provided psychosocial or spiritual support, ACP counseling assistance      Time Total: 50 Greater than 50%  of this time was spent counseling and coordinating care related to the above assessment and plan.  Signed by:  Ihor Dow, DNP, FNP-C Palliative Medicine Team  Phone: [phone] Fax: [phone]   Please contact Palliative Medicine Team phone at [phone] for questions and concerns.  For individual provider: See Shea Evans

## 2021-01-08 NOTE — TOC Initial Note (Addendum)
Transition of Care [facility]) - Initial/Assessment Note    Patient Details  Name: Robert [name] MRN: [id number] Date of Birth: 1945-12-03  Transition of [facility]) CM/SW Contact:    Ross Ludwig, LCSW Phone Number: 01/08/2021, 11:30 AM  Clinical Narrative:                  Patient is a 75 year old male who is alert and oriented x4.  Patient's wife was at bedside and inquired about rehab options.  She asked about inpatient rehab, CSW informed her that PT is recommending SNF instead of CIR due to the amount of intensity that he would have to be able to tolerate 3-4 hours.  CSW explained that there are about 20 facilities in the area and CSW will have to send information out to different facilities after he has surgery to see who has beds available.  Patient's wife expressed understanding.  CSW to continue to follow patient's progress throughout discharge planning.  Expected Discharge Plan: Skilled Nursing Facility Barriers to Discharge: Continued Medical Work up   Patient Goals and CMS Choice Patient states their goals for this hospitalization and ongoing recovery are:: To go to rehab first then return back home with home health. CMS Medicare.gov Compare Post Acute Care list provided to:: Patient Represenative (must comment) Choice offered to / list presented to : Spouse  Expected Discharge Plan and Services Expected Discharge Plan: [place] Choice: [place] arrangements for the past 2 months: Single Family Home                                      Prior Living Arrangements/Services Living arrangements for the past 2 months: Single Family Home Lives with:: Spouse Patient language and need for interpreter reviewed:: No Do you feel safe going back to the place where you live?: No   Patient's wife feels that he needs SNF for short term rehab, before he is able to return back home.  Need for Family Participation in  Patient Care: Yes (Comment) Care giver support system in place?: No (comment)   Criminal Activity/Legal Involvement Pertinent to Current Situation/Hospitalization: No - Comment as needed  Activities of Daily Living Home Assistive Devices/Equipment: Eyeglasses,Grab bars around toilet,Grab bars in shower,Hearing aid,Raised toilet seat with rails,Shower chair with back (hemi walker) ADL Screening (condition at time of admission) Patient's cognitive ability adequate to safely complete daily activities?: Yes Is the patient deaf or have difficulty hearing?: Yes Does the patient have difficulty seeing, even when wearing glasses/contacts?: Yes Does the patient have difficulty concentrating, remembering, or making decisions?: Yes Patient able to express need for assistance with ADLs?: Yes Does the patient have difficulty dressing or bathing?: Yes Independently performs ADLs?: No Communication: Independent Dressing (OT): Needs assistance Is this a change from baseline?: Pre-admission baseline Grooming: Needs assistance Is this a change from baseline?: Pre-admission baseline Feeding: Independent Bathing: Needs assistance Is this a change from baseline?: Pre-admission baseline Toileting: Needs assistance Is this a change from baseline?: Pre-admission baseline In/Out Bed: Needs assistance Is this a change from baseline?: Pre-admission baseline Walks in Home: Independent with device (comment) Does the patient have difficulty walking or climbing stairs?: No Weakness of Legs: Both Weakness of Arms/Hands: Left  Permission Sought/Granted Permission sought to share information with : Facility Contact Representative,Family Supports Permission granted to share information with : Yes, Verbal  Permission Granted,Yes, Release of Information Signed  Share Information with NAME: Evetts,[name] Spouse [phone]  [phone]  Permission granted to share info w AGENCY: SNF admissions        Emotional  Assessment Appearance:: Appears stated age   Affect (typically observed): Accepting,Appropriate,Calm,Stable Orientation: : Oriented to Self,Oriented to Place,Oriented to  Time,Oriented to Situation Alcohol / Substance Use: Not Applicable Psych Involvement: No (comment)  Admission diagnosis:  Acute pancreatitis [K85.90] SIRS (systemic inflammatory response syndrome) (HCC) [R65.10] Sepsis secondary to UTI ([place]) [A41.9, N39.0] Idiopathic acute pancreatitis without infection or necrosis [K85.00] Urinary tract infection associated with indwelling urethral catheter, initial encounter ([place]) [S17.793J, N39.0] Patient Active Problem List   Diagnosis Date Noted  . 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 Gallstone pancreatitis 01/07/2021  . Pressure injury of skin 01/05/2021  . Sepsis secondary to UTI ([place]) 11/10/2020  . AKI (acute kidney injury) ([place]) 11/10/2020  . Hyperkalemia 11/10/2020  . Parkinson disease ([place]) 11/10/2020  . Hyperglycemia 11/10/2020  . Elevated LFTs 11/10/2020  . Diarrhea   . Other hydronephrosis   . History of deep vein thrombosis (DVT) of lower extremity   . Urinary retention 08/14/2019  . History of CVA with residual deficit   . BPH (benign prostatic hyperplasia)   . Orthostatic hypotension 08/13/2019  . Hyperlipidemia   . Hemiparesis affecting left side as late effect of cerebrovascular accident ([place]) 12/06/2017  . Gait abnormality 11/22/2017  . UTI (urinary tract infection) 04/22/2017  . Acute lower UTI 04/22/2017  . Tremor 04/22/2017  . Acute pyelonephritis 09/29/2012  . Nausea 09/29/2012  . Fever 09/29/2012  . HTN (hypertension) 09/29/2012  . H/O: CVA (cerebrovascular accident) 09/29/2012  . Hearing loss 09/29/2012   PCP:  [name], MD Pharmacy:   [facility] [id number] [place] [address] [place] [address] Phone: [phone] Fax: [phone]     Social  Determinants of Health (SDOH) Interventions    Readmission Risk Interventions Readmission Risk Prevention Plan 08/16/2019  Post Dischage Appt Not Complete  Appt Comments disposition tbd  Medication Screening Complete  Transportation Screening Complete  Some recent data might be hidden

## 2021-01-08 NOTE — Progress Notes (Signed)
Occupational Therapy Treatment Patient Details Name: Robert [name] MRN: [id number] DOB: 05-21-1946 Today's Date: 01/08/2021    History of present illness 75 yo male presented with fever and vomiting; admitted with sepsis 2* UTI and acute pancreatitis. Hx of dementia, CVA with L residual weakness/drop foot, DVT, IVC filter, hearing loss, Parkinson's.   OT comments  Patient max assist to transfer to side of bed and mod x 2 to stand from elevated bed height with hemiwalker. Patient able to come into standing but unable to take step resulting in a squat pivot to recliner. No complaints of pain or distress. Continue to recommend short term rehab at discharge.   Follow Up Recommendations  SNF    Equipment Recommendations  None recommended by OT    Recommendations for Other Services      Precautions / Restrictions Precautions Precautions: Fall Required Braces or Orthoses: Other Brace Other Brace: LT double upright AFO Restrictions Weight Bearing Restrictions: No       Mobility Bed Mobility Overal bed mobility: Needs Assistance Bed Mobility: Supine to Sit     Supine to sit: HOB elevated;Max assist;+2 for safety/equipment     General bed mobility comments: max assist to transfer into sitting at edge of bed. Transferred to the left which patient doesn't typically transfer to.    Transfers Overall transfer level: Needs assistance Equipment used: Hemi-walker Transfers: Sit to/from [employer] Sit to Stand: Mod assist;+2 physical assistance;+2 safety/equipment;From elevated surface   Squat pivot transfers: Mod assist;+2 physical assistance;+2 safety/equipment;From elevated surface     General transfer comment: Able to stand from elevated surface with specific postioning of LEs with hemiwalker but unable to take a step. Mod x 2 to squat pivot to recilner placed on his right side.    Balance Overall balance assessment: Needs assistance Sitting-balance support:  Single extremity supported Sitting balance-Leahy Scale: Fair Sitting balance - Comments: able to sit edge of bed with single UE support   Standing balance support: Single extremity supported Standing balance-Leahy Scale: Poor                             ADL either performed or assessed with clinical judgement   ADL                                               Vision Patient Visual Report: No change from baseline     Perception     Praxis      Cognition Arousal/Alertness: Awake/alert Behavior During Therapy: WFL for tasks assessed/performed Overall Cognitive Status: History of cognitive impairments - at baseline                                 General Comments: Pt oriented to person and place. Wife assisting pt with orientation questions by giving cues as needed. No apparent change from baseline. Pt with decreased awareness of current impairments.        Exercises     Shoulder Instructions       General Comments      Pertinent Vitals/ Pain       Pain Assessment: No/denies pain  Home Living  Prior Functioning/Environment              Frequency  Min 2X/week        Progress Toward Goals  OT Goals(current goals can now be found in the care plan section)  Progress towards OT goals: Progressing toward goals  Acute Rehab OT Goals Patient Stated Goal: Per wife: For pt to agree to go to Rehab/SNF OT Goal Formulation: With patient/family Time For Goal Achievement: 01/20/21 Potential to Achieve Goals: Good  Plan Discharge plan remains appropriate    Co-evaluation          OT goals addressed during session:  (functional mobility)      AM-PAC OT "6 Clicks" Daily Activity     Outcome Measure   Help from another person eating meals?: A Little Help from another person taking care of personal grooming?: A Little Help from another person toileting,  which includes using toliet, bedpan, or urinal?: Total Help from another person bathing (including washing, rinsing, drying)?: A Lot Help from another person to put on and taking off regular upper body clothing?: A Lot Help from another person to put on and taking off regular lower body clothing?: Total 6 Click Score: 12    End of Session Equipment Utilized During Treatment: Gait belt;Other (comment)  OT Visit Diagnosis: Unsteadiness on feet (R26.81);Muscle weakness (generalized) (M62.81);Hemiplegia and hemiparesis Hemiplegia - Right/Left: Left Hemiplegia - dominant/non-dominant: Non-Dominant Hemiplegia - caused by: Cerebral infarction   Activity Tolerance Patient limited by fatigue   Patient Left in chair;with call bell/phone within reach;with chair alarm set;with family/visitor present   Nurse Communication Mobility status;Need for lift equipment        Time: 4970-2637 OT Time Calculation (min): 21 min  Charges: OT General Charges $OT Visit: 1 Visit OT Treatments $Therapeutic Activity: 8-22 mins  Derl Barrow, OTR/L [place]  Office [phone] Pager: [place] 01/08/2021, 2:10 PM

## 2021-01-08 NOTE — Progress Notes (Signed)
Progress Note  Patient Name: Robert [name] Date of Encounter: 01/08/2021  [facility] Cardiologist: Fransico Him, MD   Subjective   No chest pain or shortness of breath.  Inpatient Medications    Scheduled Meds: . busPIRone  7.5 mg Oral BID  . Carbidopa-Levodopa ER  1 tablet Oral QHS  . Carbidopa-Levodopa ER  2 tablet Oral BID  . Chlorhexidine Gluconate Cloth  6 each Topical Daily  . losartan  50 mg Oral Daily  . pantoprazole  40 mg Oral Daily  . Pimavanserin Tartrate  34 mg Oral Daily  . rasagiline  1 mg Oral Daily  . simvastatin  20 mg Oral QPM  . tamsulosin  0.4 mg Oral Daily   Continuous Infusions: . sodium chloride 75 mL/hr at 01/08/21 0102  . cefTRIAXone (ROCEPHIN)  IV 1 g (01/07/21 2115)   PRN Meds: acetaminophen **OR** acetaminophen, albuterol, bisacodyl, ipratropium, magnesium citrate, morphine injection, ondansetron **OR** ondansetron (ZOFRAN) IV, polyethylene glycol, senna-docusate, traMADol, traZODone   Vital Signs    Vitals:   01/07/21 0356 01/07/21 1358 01/07/21 2118 01/08/21 0527  BP: 127/71 118/63 132/77 (!) 152/82  Pulse: 69 80 65 77  Resp: 20 17 20 16   Temp: 98.6 F (37 C) 98.3 F (36.8 C) 99 F (37.2 C) (!) 97.5 F (36.4 C)  TempSrc: Oral Oral  Oral  SpO2: 97% 95% 93% 96%  Weight:      Height:        Intake/Output Summary (Last 24 hours) at 01/08/2021 1020 Last data filed at 01/08/2021 0255 Gross per 24 hour  Intake -  Output 325 ml  Net -325 ml   Last 3 Weights 01/04/2021 11/09/2020 02/23/2020  Weight (lbs) 244 lb 240 lb 236 lb  Weight (kg) 110.678 kg 108.863 kg 107.049 kg      Telemetry    Normal sinus rhythm- Personally Reviewed  ECG    n/a  Physical Exam   GEN: No acute distress.   Neck: No JVD Cardiac: RRR, no murmurs, rubs, or gallops.  Respiratory: Clear to auscultation bilaterally. GI: Soft, nontender, non-distended  MS: No edema; Neuro:   Alert and oriented x3 Psych: Normal affect   Labs    High  Sensitivity Troponin:  No results for input(s): TROPONINIHS in the last 720 hours.    Chemistry Recent Labs  Lab 01/04/21 1710 01/05/21 0454 01/06/21 0336 01/07/21 0340  NA 133* 133* 133* 138  K 3.9 4.0 3.7 3.7  CL 99 101 101 106  CO2 21* 24 23 24   GLUCOSE 196* 159* 150* 159*  BUN 8 8 8 8   CREATININE 0.93 0.91 0.79 0.90  CALCIUM 8.9 8.7* 8.5* 8.5*  PROT 7.1 6.8  --  6.1*  ALBUMIN 3.5 3.3*  --  2.8*  AST 138* 78*  --  25  ALT 68* 42  --  30  ALKPHOS 131* 118  --  82  BILITOT 2.9* 2.2*  --  1.2  GFRNONAA >60 >60 >60 >60  ANIONGAP 13 8 9 8      Hematology Recent Labs  Lab 01/05/21 0454 01/06/21 0336 01/07/21 0340  WBC 17.9* 13.7* 8.3  RBC 4.98 4.46 4.34  HGB 14.6 13.2 12.8*  HCT 45.0 41.3 40.1  MCV 90.4 92.6 92.4  MCH 29.3 29.6 29.5  MCHC 32.4 32.0 31.9  RDW 13.9 14.2 14.2  PLT 280 258 258    BNP Recent Labs  Lab 01/04/21 1711  BNP 111.1*     DDimer No results for input(s): DDIMER  in the last 168 hours.   Radiology    No results found.  Cardiac Studies   Pending echocardiogram  Patient Profile     75 y.o. male with a hx of hypertension, hyperlipidemia, stroke in 2008 with left-sided hemiparesis, DVT status post IVC filter in 2008 on Eliquis, Parkinson disease, and GERD, recurrent UTI with urinary retention s/p chronic indwelling Foley who is being seen for the evaluation of surgical clearance at the request of Dr. Karleen Hampshire.  Assessment & Plan   1.  Surgical clearance for laparoscopic cholecystectomy with cholangiogram Patient was doing physical therapy for 1 hour 2 times per week without cardiac symptoms. Despite limited ambulation he was able to get at least 4 METS of activity.  History unreliable due to Parkinson disease.  EKG without acute ischemic changes. RCRI 6.6%. -Pending echocardiogram.  2.  Hypertension -Blood pressure stable on losartan  3.  History of stroke secondary to DVT s/p IVC filter in 2008 -Eliquis on hold for surgery   I  have spoke personally with Dr. Irven Shelling staff.  They could not find any office visit note or any testing.  For questions or updates, please contact [place] Please consult [email or web address] for contact info under        SignedLeanor Kail, PA  01/08/2021, 10:20 AM

## 2021-01-08 NOTE — Progress Notes (Signed)
Progress Note     Subjective: Patient denies abdominal pain, nausea or vomiting. He is passing flatus. Scheduled for ECHO today and he and his wife are agreeable to proceed with surgery tomorrow pending cardiology clearance.   Objective: Vital signs in last 24 hours: Temp:  [97.5 F (36.4 C)-99 F (37.2 C)] 97.5 F (36.4 C) (03/16 0527) Pulse Rate:  [65-80] 77 (03/16 0527) Resp:  [16-20] 16 (03/16 0527) BP: (118-152)/(63-82) 152/82 (03/16 0527) SpO2:  [93 %-96 %] 96 % (03/16 0527) Last BM Date: 01/07/21  Intake/Output from previous day: 03/15 0701 - 03/16 0700 In: -  Out: 325 [Urine:325] Intake/Output this shift: No intake/output data recorded.  PE: General: pleasant, WD, obese male who is laying in bed in NAD HEENT: Sclera are anicteri.   Heart: regular, rate, and rhythm.  Lungs: CTAB, no wheezes, rhonchi, or rales noted.  Respiratory effort nonlabored Abd: soft, NT, ND, +BS, no masses, hernias, or organomegaly   Lab Results:  Recent Labs    01/06/21 0336 01/07/21 0340  WBC 13.7* 8.3  HGB 13.2 12.8*  HCT 41.3 40.1  PLT 258 258   BMET Recent Labs    01/06/21 0336 01/07/21 0340  NA 133* 138  K 3.7 3.7  CL 101 106  CO2 23 24  GLUCOSE 150* 159*  BUN 8 8  CREATININE 0.79 0.90  CALCIUM 8.5* 8.5*   PT/INR No results for input(s): LABPROT, INR in the last 72 hours. CMP     Component Value Date/Time   NA 138 01/07/2021 0340   K 3.7 01/07/2021 0340   CL 106 01/07/2021 0340   CO2 24 01/07/2021 0340   GLUCOSE 159 (H) 01/07/2021 0340   BUN 8 01/07/2021 0340   CREATININE 0.90 01/07/2021 0340   CALCIUM 8.5 (L) 01/07/2021 0340   PROT 6.1 (L) 01/07/2021 0340   ALBUMIN 2.8 (L) 01/07/2021 0340   AST 25 01/07/2021 0340   ALT 30 01/07/2021 0340   ALKPHOS 82 01/07/2021 0340   BILITOT 1.2 01/07/2021 0340   GFRNONAA >60 01/07/2021 0340   GFRAA >60 09/28/2019 2052   Lipase     Component Value Date/Time   LIPASE 36 01/06/2021 0336        Studies/Results: No results found.  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  - will tentatively plan lap chole for tomorrow, pending cards clearance   FEN: soft diet ID: Rocephin 3/12>>  DVT:  Eliquis has been held Follow-up: TBD  LOS: 3 days    Norm Parcel , [facility] Surgery 01/08/2021, 9:14 AM Please see Amion for pager number during day hours 7:00am-4:30pm

## 2021-01-08 NOTE — Progress Notes (Signed)
PT Cancellation Note  Patient Details Name: Grantley Savage MRN: [id number] DOB: 09-Mar-1946   Cancelled Treatment:    Reason Eval/Treat Not Completed: Medical issues which prohibited therapy, note that plans are for surgery 3/17. Will follow uo 3/18.   Claretha Cooper 01/08/2021, 12:52 PM [place] Pager [phone] Office [phone]

## 2021-01-08 NOTE — Care Management Important Message (Signed)
Important Message  Patient Details IM Letter given to the Patient. Name: Robert [name] MRN: [id number] Date of Birth: [birth year]   Medicare Important Message Given:  Yes     [name] 01/08/2021, 11:17 AM

## 2021-01-08 NOTE — H&P (View-Only) (Signed)
Progress Note     Subjective: Patient denies abdominal pain, nausea or vomiting. He is passing flatus. Scheduled for ECHO today and he and his wife are agreeable to proceed with surgery tomorrow pending cardiology clearance.   Objective: Vital signs in last 24 hours: Temp:  [97.5 F (36.4 C)-99 F (37.2 C)] 97.5 F (36.4 C) (03/16 0527) Pulse Rate:  [65-80] 77 (03/16 0527) Resp:  [16-20] 16 (03/16 0527) BP: (118-152)/(63-82) 152/82 (03/16 0527) SpO2:  [93 %-96 %] 96 % (03/16 0527) Last BM Date: 01/07/21  Intake/Output from previous day: 03/15 0701 - 03/16 0700 In: -  Out: 325 [Urine:325] Intake/Output this shift: No intake/output data recorded.  PE: General: pleasant, WD, obese male who is laying in bed in NAD HEENT: Sclera are anicteri.   Heart: regular, rate, and rhythm.  Lungs: CTAB, no wheezes, rhonchi, or rales noted.  Respiratory effort nonlabored Abd: soft, NT, ND, +BS, no masses, hernias, or organomegaly   Lab Results:  Recent Labs    01/06/21 0336 01/07/21 0340  WBC 13.7* 8.3  HGB 13.2 12.8*  HCT 41.3 40.1  PLT 258 258   BMET Recent Labs    01/06/21 0336 01/07/21 0340  NA 133* 138  K 3.7 3.7  CL 101 106  CO2 23 24  GLUCOSE 150* 159*  BUN 8 8  CREATININE 0.79 0.90  CALCIUM 8.5* 8.5*   PT/INR No results for input(s): LABPROT, INR in the last 72 hours. CMP     Component Value Date/Time   NA 138 01/07/2021 0340   K 3.7 01/07/2021 0340   CL 106 01/07/2021 0340   CO2 24 01/07/2021 0340   GLUCOSE 159 (H) 01/07/2021 0340   BUN 8 01/07/2021 0340   CREATININE 0.90 01/07/2021 0340   CALCIUM 8.5 (L) 01/07/2021 0340   PROT 6.1 (L) 01/07/2021 0340   ALBUMIN 2.8 (L) 01/07/2021 0340   AST 25 01/07/2021 0340   ALT 30 01/07/2021 0340   ALKPHOS 82 01/07/2021 0340   BILITOT 1.2 01/07/2021 0340   GFRNONAA >60 01/07/2021 0340   GFRAA >60 09/28/2019 2052   Lipase     Component Value Date/Time   LIPASE 36 01/06/2021 0336        Studies/Results: No results found.  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  - will tentatively plan lap chole for tomorrow, pending cards clearance   FEN: soft diet ID: Rocephin 3/12>>  DVT:  Eliquis has been held Follow-up: TBD  LOS: 3 days    Norm Parcel , [facility] Surgery 01/08/2021, 9:14 AM Please see Amion for pager number during day hours 7:00am-4:30pm

## 2021-01-08 NOTE — Progress Notes (Signed)
  Echocardiogram 2D Echocardiogram has been performed.  Jennette Dubin 01/08/2021, 3:47 PM

## 2021-01-08 NOTE — Progress Notes (Signed)
[facility]  PROGRESS NOTE  Robert [name] OZY:[id number] DOB: 04/03/1946 DOA: 01/04/2021 PCP: [name], MD   Brief HPI:   75 year old male with history of CVA with residual left-sided hemiparesis, GERD, DVT on Eliquis, hypertension, hyperlipidemia, chronic indwelling Foley catheter changed on 01/05/2021, Parkinson disease came to ED with complaints of vomiting.  In the ED was found to have acute pancreatitis and abnormal UA.  CT also showed numerous gallstones with no gallbladder, mildly distended, no pericholecystic inflammatory changes.  Patient's abdominal pain resolved, pancreatic enzymes normalized.  Surgery was consulted for possible gallstone pancreatitis.  Plan for lap chole on Thursday pending cardiology clearance.   Subjective   Patient seen and examined, denies any complaints.   Assessment/Plan:     1. Gallstone pancreatitis-liver enzymes have improved, lipase is down to 36.  Ultrasound abdomen showed multiple gallstones in the gallbladder lumen, no pericholecystic fluid.  General surgery consulted, plan for laparoscopic cholecystectomy on Thursday pending cardiology clearance. 2. Asymptomatic bacteriuria versus UTI-patient has chronic indwelling Foley catheter, abnormal UA.  Lactic acid was 1.0 on admission.  Urine culture was not obtained at the time of admission.  He has been on antibiotics since 01/04/2021.  WBC is down to 8.3.  Will complete 5 days of treatment with IV antibiotics. 3. History of CVA with left hemiparesis-PT evaluation done, recommend skilled nursing facility on discharge. 4. Hypertension-blood pressure is stable, continue losartan 5. History of Parkinson disease-continue carbidopa levodopa, rasagiline. 6. History of DVT-s/p IVC filter, Eliquis for anticoagulation     COVID-19 Labs  No results for input(s): DDIMER, FERRITIN, LDH, CRP in the last 72 hours.  Lab Results  Component Value Date   SARSCOV2NAA NEGATIVE 01/04/2021   [place]  NEGATIVE 11/27/2020   [place] NEGATIVE 11/09/2020   SARSCOV2NAA NOT DETECTED 08/18/2019     Scheduled medications:   . busPIRone  7.5 mg Oral BID  . Carbidopa-Levodopa ER  1 tablet Oral QHS  . Carbidopa-Levodopa ER  2 tablet Oral BID  . Chlorhexidine Gluconate Cloth  6 each Topical Daily  . losartan  50 mg Oral Daily  . pantoprazole  40 mg Oral Daily  . Pimavanserin Tartrate  34 mg Oral Daily  . rasagiline  1 mg Oral Daily  . simvastatin  20 mg Oral QPM  . tamsulosin  0.4 mg Oral Daily         CBG: Recent Labs  Lab 01/04/21 1710  GLUCAP 179*    SpO2: 98 %    CBC: Recent Labs  Lab 01/04/21 1710 01/05/21 0454 01/06/21 0336 01/07/21 0340  WBC 13.3* 17.9* 13.7* 8.3  NEUTROABS 10.8*  --  10.5*  --   HGB 14.9 14.6 13.2 12.8*  HCT 46.2 45.0 41.3 40.1  MCV 91.8 90.4 92.6 92.4  PLT 270 280 258 048    Basic Metabolic Panel: Recent Labs  Lab 01/04/21 1710 01/05/21 0454 01/06/21 0336 01/07/21 0340  NA 133* 133* 133* 138  K 3.9 4.0 3.7 3.7  CL 99 101 101 106  CO2 21* 24 23 24   GLUCOSE 196* 159* 150* 159*  BUN 8 8 8 8   CREATININE 0.93 0.91 0.79 0.90  CALCIUM 8.9 8.7* 8.5* 8.5*  MG  --   --  1.9  --      Liver Function Tests: Recent Labs  Lab 01/04/21 1710 01/05/21 0454 01/07/21 0340  AST 138* 78* 25  ALT 68* 42 30  ALKPHOS 131* 118 82  BILITOT 2.9* 2.2* 1.2  PROT 7.1 6.8 6.1*  ALBUMIN 3.5 3.3* 2.8*     Antibiotics: Anti-infectives (From admission, onward)   Start     Dose/Rate Route Frequency Ordered Stop   01/05/21 2000  cefTRIAXone (ROCEPHIN) 1 g in sodium chloride 0.9 % 100 mL IVPB        1 g 200 mL/hr over 30 Minutes Intravenous Every 24 hours 01/05/21 0429     01/04/21 1900  cefTRIAXone (ROCEPHIN) 1 g in sodium chloride 0.9 % 100 mL IVPB        1 g 200 mL/hr over 30 Minutes Intravenous  Once 01/04/21 1857 01/04/21 2022       DVT prophylaxis: SCDs  Code Status: Full code  Family Communication: No family at  bedside   Consultants:  General surgery   Cardiology  Neurology  Procedures:  Echocardiogram    Objective   Vitals:   01/07/21 1358 01/07/21 2118 01/08/21 0527 01/08/21 1446  BP: 118/63 132/77 (!) 152/82 (!) 142/89  Pulse: 80 65 77 84  Resp: 17 20 16 20   Temp: 98.3 F (36.8 C) 99 F (37.2 C) (!) 97.5 F (36.4 C) 98.1 F (36.7 C)  TempSrc: Oral  Oral Oral  SpO2: 95% 93% 96% 98%  Weight:      Height:        Intake/Output Summary (Last 24 hours) at 01/08/2021 1803 Last data filed at 01/08/2021 1446 Gross per 24 hour  Intake 1062 ml  Output 325 ml  Net 737 ml    03/14 1901 - 03/16 0700 In: -  Out: 9379 [Urine:1425]  Filed Weights   01/04/21 1708  Weight: 110.7 kg    Physical Examination:    General-appears in no acute distress  Heart-S1-S2, regular, no murmur auscultated  Lungs-clear to auscultation bilaterally, no wheezing or crackles auscultated  Abdomen-soft, nontender, no organomegaly  Extremities-no edema in the lower extremities  Neuro-alert, oriented x3, no focal deficit noted   Status is: Inpatient  Dispo: The patient is from: Home              Anticipated d/c is to: Home              Anticipated d/c date is: 01/10/2021              Patient currently not medically stable for discharge  Barrier to discharge-plan for lap chole in a.m.  Pressure Injury 01/05/21 Right Stage 2 -  Partial thickness loss of dermis presenting as a shallow open injury with a red, pink wound bed without slough. (Active)  01/05/21 0500  Location:   Location Orientation: Right  Staging: Stage 2 -  Partial thickness loss of dermis presenting as a shallow open injury with a red, pink wound bed without slough.  Wound Description (Comments):   Present on Admission: Yes           Data Reviewed:   Recent Results (from the past 240 hour(s))  SARS CORONAVIRUS 2 (TAT 6-24 HRS) Nasopharyngeal Nasopharyngeal Swab     Status: None   Collection Time: 01/04/21   7:32 PM   Specimen: Nasopharyngeal Swab  Result Value Ref Range Status   SARS Coronavirus 2 NEGATIVE NEGATIVE Final    Comment: (NOTE) SARS-CoV-2 target nucleic acids are NOT DETECTED.  The SARS-CoV-2 RNA is generally detectable in upper and lower respiratory specimens during the acute phase of infection. Negative results do not preclude SARS-CoV-2 infection, do not rule out co-infections with other pathogens, and should not be used as the sole basis for treatment or  other patient management decisions. Negative results must be combined with clinical observations, patient history, and epidemiological information. The expected result is Negative.  Fact Sheet for Patients: SugarRoll.be  Fact Sheet for Healthcare Providers: https://www.woods-mathews.com/  This test is not yet approved or cleared by the [place] FDA and  has been authorized for detection and/or diagnosis of SARS-CoV-2 by FDA under an Emergency Use Authorization (EUA). This EUA will remain  in effect (meaning this test can be used) for the duration of the COVID-19 declaration under Se ction 564(b)(1) of the Act, 21 U.S.C. section 360bbb-3(b)(1), unless the authorization is terminated or revoked sooner.  Performed at [facility], [place] [address] [place], [place] [address]     Recent Labs  Lab 01/04/21 1710 01/05/21 0857 01/06/21 0336  LIPASE 279* 63* 36   No results for input(s): AMMONIA in the last 168 hours.  Cardiac Enzymes: No results for input(s): CKTOTAL, CKMB, CKMBINDEX, TROPONINI in the last 168 hours. BNP (last 3 results) Recent Labs    01/04/21 1711  BNP 111.1*    ProBNP (last 3 results) No results for input(s): PROBNP in the last 8760 hours.  Studies:  ECHOCARDIOGRAM COMPLETE  Result Date: 01/08/2021    ECHOCARDIOGRAM REPORT   Patient Name:   Robert [name] Date of Exam: 01/08/2021 Medical Rec #:  [id number]      Height:       74.0 in  Accession #:    [id number]     Weight:       244.0 lb Date of Birth:  05/04/1946      BSA:          2.365 m Patient Age:    32 years       BP:           152/82 mmHg Patient Gender: M              HR:           77 bpm. Exam Location:  Inpatient Procedure: 2D Echo Indications:    Pre-operative Cardiocascular examination Z01.810  History:        Patient has no prior history of Echocardiogram examinations.                 Risk Factors:Hypertension and Dyslipidemia.  Sonographer:    Mikki Santee RDCS (AE) Referring Phys: [id number] [place]  1. Left ventricular ejection fraction, by estimation, is 55 to 60%. The left ventricle has normal function. The left ventricle has no regional wall motion abnormalities. There is moderate concentric left ventricular hypertrophy. Left ventricular diastolic parameters are consistent with Grade I diastolic dysfunction (impaired relaxation).  2. Right ventricular systolic function is normal. The right ventricular size is normal.  3. The mitral valve is grossly normal. Trivial mitral valve regurgitation. No evidence of mitral stenosis.  4. The aortic valve is tricuspid. Aortic valve regurgitation is not visualized. No aortic stenosis is present. FINDINGS  Left Ventricle: Left ventricular ejection fraction, by estimation, is 55 to 60%. The left ventricle has normal function. The left ventricle has no regional wall motion abnormalities. The left ventricular internal cavity size was normal in size. There is  moderate concentric left ventricular hypertrophy. Left ventricular diastolic parameters are consistent with Grade I diastolic dysfunction (impaired relaxation). Right Ventricle: The right ventricular size is normal. No increase in right ventricular wall thickness. Right ventricular systolic function is normal. Left Atrium: Left atrial size was normal in size. Right Atrium: Right atrial size was  normal in size. Pericardium: Trivial pericardial effusion is present.  Mitral Valve: The mitral valve is grossly normal. Trivial mitral valve regurgitation. No evidence of mitral valve stenosis. Tricuspid Valve: The tricuspid valve is grossly normal. Tricuspid valve regurgitation is trivial. No evidence of tricuspid stenosis. Aortic Valve: The aortic valve is tricuspid. Aortic valve regurgitation is not visualized. No aortic stenosis is present. Pulmonic Valve: The pulmonic valve was grossly normal. Pulmonic valve regurgitation is mild. No evidence of pulmonic stenosis. Aorta: The aortic root and ascending aorta are structurally normal, with no evidence of dilitation. Venous: The inferior vena cava was not well visualized. IAS/Shunts: The atrial septum is grossly normal.  LEFT VENTRICLE PLAX 2D LVIDd:         4.50 cm  Diastology LVIDs:         3.10 cm  LV e' medial:    5.11 cm/s LV PW:         1.30 cm  LV E/e' medial:  11.1 LV IVS:        1.40 cm  LV e' lateral:   6.75 cm/s LVOT diam:     2.30 cm  LV E/e' lateral: 8.4 LV SV:         55 LV SV Index:   23 LVOT Area:     4.15 cm  RIGHT VENTRICLE RV S prime:     10.10 cm/s TAPSE (M-mode): 1.0 cm LEFT ATRIUM             Index       RIGHT ATRIUM           Index LA diam:        3.40 cm 1.44 cm/m  RA Area:     12.20 cm LA Vol (A2C):   53.2 ml 22.49 ml/m RA Volume:   21.40 ml  9.05 ml/m LA Vol (A4C):   44.3 ml 18.73 ml/m LA Biplane Vol: 51.9 ml 21.94 ml/m  AORTIC VALVE LVOT Vmax:   72.40 cm/s LVOT Vmean:  48.100 cm/s LVOT VTI:    0.132 m  AORTA Ao Root diam: 3.80 cm MITRAL VALVE               TRICUSPID VALVE MV Area (PHT): 2.60 cm    TR Peak grad:   22.3 mmHg MV Decel Time: 292 msec    TR Vmax:        236.00 cm/s MV E velocity: 56.80 cm/s MV A velocity: 78.10 cm/s  SHUNTS MV E/A ratio:  0.73        Systemic VTI:  0.13 m                            Systemic Diam: 2.30 cm Eleonore Chiquito MD Electronically signed by Eleonore Chiquito MD Signature Date/Time: 01/08/2021/4:16:21 PM    Final        Oswald Hillock   [facility] If  7PM-7AM, please contact night-coverage at [email or web address], Office  [phone]   01/08/2021, 6:03 PM  LOS: 3 days

## 2021-01-09 ENCOUNTER — Encounter (HOSPITAL_COMMUNITY): Payer: Self-pay | Admitting: Family Medicine

## 2021-01-09 ENCOUNTER — Encounter (HOSPITAL_COMMUNITY)
Admission: EM | Disposition: A | Discharge status: Skilled Nursing Facility | Payer: Self-pay | Source: Home / Self Care | Attending: Family Medicine

## 2021-01-09 ENCOUNTER — Inpatient Hospital Stay (HOSPITAL_COMMUNITY): Payer: Medicare Other | Admitting: Certified Registered Nurse Anesthetist

## 2021-01-09 ENCOUNTER — Inpatient Hospital Stay (HOSPITAL_COMMUNITY): Payer: Medicare Other

## 2021-01-09 DIAGNOSIS — Z01818 Encounter for other preprocedural examination: Secondary | ICD-10-CM

## 2021-01-09 DIAGNOSIS — I10 Essential (primary) hypertension: Secondary | ICD-10-CM

## 2021-01-09 HISTORY — PX: LAPAROSCOPIC CHOLECYSTECTOMY SINGLE PORT: SHX5891

## 2021-01-09 LAB — CBC
HCT: 41.9 % (ref 39.0–52.0)
Hemoglobin: 13.4 g/dL (ref 13.0–17.0)
MCH: 29.5 pg (ref 26.0–34.0)
MCHC: 32 g/dL (ref 30.0–36.0)
MCV: 92.1 fL (ref 80.0–100.0)
Platelets: 322 10*3/uL (ref 150–400)
RBC: 4.55 MIL/uL (ref 4.22–5.81)
RDW: 13.8 % (ref 11.5–15.5)
WBC: 6.9 10*3/uL (ref 4.0–10.5)
nRBC: 0 % (ref 0.0–0.2)

## 2021-01-09 SURGERY — LAPAROSCOPIC CHOLECYSTECTOMY SINGLE SITE
Anesthesia: General | Site: Abdomen

## 2021-01-09 MED ORDER — EPHEDRINE 5 MG/ML INJ
INTRAVENOUS | Status: AC
  Filled 2021-01-09: qty 10

## 2021-01-09 MED ORDER — BUPIVACAINE-EPINEPHRINE (PF) 0.25% -1:200000 IJ SOLN
INTRAMUSCULAR | Status: AC
  Filled 2021-01-09: qty 30

## 2021-01-09 MED ORDER — ROCURONIUM BROMIDE 10 MG/ML (PF) SYRINGE
PREFILLED_SYRINGE | INTRAVENOUS | Status: AC
  Filled 2021-01-09: qty 10

## 2021-01-09 MED ORDER — PROPOFOL 10 MG/ML IV BOLUS
INTRAVENOUS | Status: DC | PRN
  Administered 2021-01-09: 150 mg via INTRAVENOUS

## 2021-01-09 MED ORDER — LIDOCAINE 2% (20 MG/ML) 5 ML SYRINGE
INTRAMUSCULAR | Status: AC
  Filled 2021-01-09: qty 5

## 2021-01-09 MED ORDER — PHENYLEPHRINE 40 MCG/ML (10ML) SYRINGE FOR IV PUSH (FOR BLOOD PRESSURE SUPPORT)
PREFILLED_SYRINGE | INTRAVENOUS | Status: DC | PRN
  Administered 2021-01-09 (×3): 80 ug via INTRAVENOUS

## 2021-01-09 MED ORDER — LACTATED RINGERS IV SOLN: INTRAVENOUS | Status: DC

## 2021-01-09 MED ORDER — FENTANYL CITRATE (PF) 100 MCG/2ML IJ SOLN
INTRAMUSCULAR | Status: AC
  Filled 2021-01-09: qty 2

## 2021-01-09 MED ORDER — DEXAMETHASONE SODIUM PHOSPHATE 10 MG/ML IJ SOLN
INTRAMUSCULAR | Status: DC | PRN
  Administered 2021-01-09: 8 mg via INTRAVENOUS

## 2021-01-09 MED ORDER — ACETAMINOPHEN 10 MG/ML IV SOLN
1000.0000 mg | Freq: Once | INTRAVENOUS | Status: DC | PRN
Start: 2021-01-09 — End: 2021-01-09

## 2021-01-09 MED ORDER — ONDANSETRON HCL 4 MG/2ML IJ SOLN
INTRAMUSCULAR | Status: AC
  Filled 2021-01-09: qty 2

## 2021-01-09 MED ORDER — SODIUM CHLORIDE 0.9 % IV SOLN
INTRAVENOUS | Status: DC | PRN
  Administered 2021-01-09: 7 mL

## 2021-01-09 MED ORDER — SODIUM CHLORIDE 0.9 % IV SOLN
INTRAVENOUS | Status: AC
  Filled 2021-01-09: qty 20

## 2021-01-09 MED ORDER — BUPIVACAINE LIPOSOME 1.3 % IJ SUSP
20.0000 mL | Freq: Once | INTRAMUSCULAR | Status: AC
  Administered 2021-01-09: 20 mL
  Filled 2021-01-09: qty 20

## 2021-01-09 MED ORDER — ROCURONIUM BROMIDE 10 MG/ML (PF) SYRINGE
PREFILLED_SYRINGE | INTRAVENOUS | Status: DC | PRN
  Administered 2021-01-09: 60 mg via INTRAVENOUS
  Administered 2021-01-09: 10 mg via INTRAVENOUS

## 2021-01-09 MED ORDER — SODIUM CHLORIDE 0.9 % IV SOLN
2.0000 g | Freq: Once | INTRAVENOUS | Status: AC
  Administered 2021-01-09: 2 g via INTRAVENOUS

## 2021-01-09 MED ORDER — SUGAMMADEX SODIUM 200 MG/2ML IV SOLN
INTRAVENOUS | Status: DC | PRN
  Administered 2021-01-09: 200 mg via INTRAVENOUS

## 2021-01-09 MED ORDER — FENTANYL CITRATE (PF) 100 MCG/2ML IJ SOLN
25.0000 ug | INTRAMUSCULAR | Status: DC | PRN
  Administered 2021-01-09: 25 ug via INTRAVENOUS

## 2021-01-09 MED ORDER — PROPOFOL 10 MG/ML IV BOLUS
INTRAVENOUS | Status: AC
  Filled 2021-01-09: qty 20

## 2021-01-09 MED ORDER — LIDOCAINE 2% (20 MG/ML) 5 ML SYRINGE
INTRAMUSCULAR | Status: DC | PRN
  Administered 2021-01-09: 60 mg via INTRAVENOUS

## 2021-01-09 MED ORDER — FENTANYL CITRATE (PF) 100 MCG/2ML IJ SOLN
INTRAMUSCULAR | Status: DC | PRN
  Administered 2021-01-09 (×2): 50 ug via INTRAVENOUS
  Administered 2021-01-09: 25 ug via INTRAVENOUS
  Administered 2021-01-09: 50 ug via INTRAVENOUS

## 2021-01-09 MED ORDER — FENTANYL CITRATE (PF) 100 MCG/2ML IJ SOLN
INTRAMUSCULAR | Status: AC
  Administered 2021-01-09: 25 ug via INTRAVENOUS
  Filled 2021-01-09: qty 2

## 2021-01-09 MED ORDER — BUPIVACAINE-EPINEPHRINE 0.25% -1:200000 IJ SOLN
INTRAMUSCULAR | Status: DC | PRN
  Administered 2021-01-09: 60 mL

## 2021-01-09 MED ORDER — PROMETHAZINE HCL 25 MG/ML IJ SOLN: 6.2500 mg | INTRAMUSCULAR | Status: DC | PRN

## 2021-01-09 MED ORDER — CHLORHEXIDINE GLUCONATE 0.12 % MT SOLN
15.0000 mL | Freq: Once | OROMUCOSAL | Status: AC
  Administered 2021-01-09: 15 mL via OROMUCOSAL

## 2021-01-09 MED ORDER — DEXAMETHASONE SODIUM PHOSPHATE 10 MG/ML IJ SOLN
INTRAMUSCULAR | Status: AC
  Filled 2021-01-09: qty 1

## 2021-01-09 MED ORDER — 0.9 % SODIUM CHLORIDE (POUR BTL) OPTIME
TOPICAL | Status: DC | PRN
  Administered 2021-01-09: 400 mL

## 2021-01-09 MED ORDER — DEXTROSE 5 % IV SOLN
1.0000 g | Freq: Once | INTRAVENOUS | Status: DC
  Filled 2021-01-09: qty 10

## 2021-01-09 MED ORDER — ONDANSETRON HCL 4 MG/2ML IJ SOLN
INTRAMUSCULAR | Status: DC | PRN
  Administered 2021-01-09: 4 mg via INTRAVENOUS

## 2021-01-09 SURGICAL SUPPLY — 43 items
APPLIER CLIP 5 13 M/L LIGAMAX5 (MISCELLANEOUS) ×2
CABLE HIGH FREQUENCY MONO STRZ (ELECTRODE) ×2 IMPLANT
CHLORAPREP W/TINT 26 (MISCELLANEOUS) ×2 IMPLANT
CLIP APPLIE 5 13 M/L LIGAMAX5 (MISCELLANEOUS) ×1 IMPLANT
COVER MAYO STAND STRL (DRAPES) ×2 IMPLANT
COVER SURGICAL LIGHT HANDLE (MISCELLANEOUS) ×2 IMPLANT
COVER WAND RF STERILE (DRAPES) IMPLANT
DECANTER SPIKE VIAL GLASS SM (MISCELLANEOUS) ×1 IMPLANT
DRAIN CHANNEL 19F RND (DRAIN) IMPLANT
DRAPE C-ARM 42X120 X-RAY (DRAPES) ×2 IMPLANT
DRAPE WARM FLUID 44X44 (DRAPES) ×2 IMPLANT
DRSG TEGADERM 2-3/8X2-3/4 SM (GAUZE/BANDAGES/DRESSINGS) ×2 IMPLANT
DRSG TEGADERM 4X4.75 (GAUZE/BANDAGES/DRESSINGS) ×3 IMPLANT
ELECT REM PT RETURN 15FT ADLT (MISCELLANEOUS) ×2 IMPLANT
ENDOLOOP SUT PDS II  0 18 (SUTURE) ×1
ENDOLOOP SUT PDS II 0 18 (SUTURE) IMPLANT
EVACUATOR SILICONE 100CC (DRAIN) IMPLANT
GAUZE SPONGE 2X2 8PLY STRL LF (GAUZE/BANDAGES/DRESSINGS) ×1 IMPLANT
GLOVE SURG LTX SZ8 (GLOVE) ×2 IMPLANT
GLOVE SURG UNDER LTX SZ8 (GLOVE) ×2 IMPLANT
GOWN STRL REUS W/TWL XL LVL3 (GOWN DISPOSABLE) ×5 IMPLANT
IRRIG SUCT STRYKERFLOW 2 WTIP (MISCELLANEOUS) ×2
IRRIGATION SUCT STRKRFLW 2 WTP (MISCELLANEOUS) ×1 IMPLANT
KIT BASIN OR (CUSTOM PROCEDURE TRAY) ×2 IMPLANT
KIT TURNOVER KIT A (KITS) ×2 IMPLANT
PAD POSITIONING PINK XL (MISCELLANEOUS) ×2 IMPLANT
PENCIL SMOKE EVACUATOR (MISCELLANEOUS) IMPLANT
POUCH RETRIEVAL ECOSAC 10 (ENDOMECHANICALS) IMPLANT
POUCH RETRIEVAL ECOSAC 10MM (ENDOMECHANICALS) ×1
PROTECTOR NERVE ULNAR (MISCELLANEOUS) ×1 IMPLANT
SCISSORS LAP 5X35 DISP (ENDOMECHANICALS) ×2 IMPLANT
SET CHOLANGIOGRAPH MIX (MISCELLANEOUS) ×2 IMPLANT
SET TUBE SMOKE EVAC HIGH FLOW (TUBING) ×2 IMPLANT
SHEARS HARMONIC ACE PLUS 36CM (ENDOMECHANICALS) ×2 IMPLANT
SPONGE GAUZE 2X2 STER 10/PKG (GAUZE/BANDAGES/DRESSINGS) ×1
SUT MNCRL AB 4-0 PS2 18 (SUTURE) ×2 IMPLANT
SUT PDS AB 1 CT1 27 (SUTURE) ×8 IMPLANT
SYR 20ML LL LF (SYRINGE) ×2 IMPLANT
TOWEL OR 17X26 10 PK STRL BLUE (TOWEL DISPOSABLE) ×2 IMPLANT
TOWEL OR NON WOVEN STRL DISP B (DISPOSABLE) ×2 IMPLANT
TRAY LAPAROSCOPIC (CUSTOM PROCEDURE TRAY) ×2 IMPLANT
TROCAR BLADELESS OPT 5 100 (ENDOMECHANICALS) ×2 IMPLANT
TROCAR BLADELESS OPT 5 150 (ENDOMECHANICALS) ×2 IMPLANT

## 2021-01-09 NOTE — Interval H&P Note (Signed)
History and Physical Interval Note:  01/09/2021 9:39 AM  Robert [name]  has presented today for surgery, with the diagnosis of GALL STONES AND PANCREATITIS.  The various methods of treatment have been discussed with the patient and family. After consideration of risks, benefits and other options for treatment, the patient has consented to  Procedure(s) with comments: LAPAROSCOPIC CHOLECYSTECTOMY SINGLE SITE (N/A) - 90 MIN as a surgical intervention.  The patient's history has been reviewed, patient examined, no change in status, stable for surgery.  I have reviewed the patient's chart and labs.  Questions were answered to the patient's satisfaction.    ECHO okay Cleared for surgery Eliquis held >48hrs Lap chole this AM  I have re-reviewed the the patient's records, history, medications, and allergies.  I have re-examined the patient.  I again discussed intraoperative plans and goals of post-operative recovery.  The patient agrees to proceed.  Robert [name]  Aug 04, 1946 [id number]  Patient Care Team: Farris Has, MD as PCP - General (Family Medicine) Quintella Reichert, MD as PCP - Cardiology (Cardiology) Pleasant, Dennard Schaumann, RN as [facility] Care Management Tat, Octaviano Batty, DO as Consulting Physician (Neurology) Yates Decamp, MD as Consulting Physician (Cardiology)  Patient Active Problem List   Diagnosis Date Noted   Hemiparesis affecting left side as late effect of cerebrovascular accident [facility]) 12/06/2017    Priority: High   Acute pancreatitis    Palliative care by specialist    Goals of care, counseling/discussion    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 01/07/2021   Gallstone pancreatitis 01/07/2021   Pressure injury of skin 01/05/2021   Sepsis secondary to UTI (HCC) 11/10/2020   AKI (acute kidney injury) (HCC) 11/10/2020   Hyperkalemia 11/10/2020   Parkinson's disease (HCC) 11/10/2020   Hyperglycemia 11/10/2020   Elevated LFTs 11/10/2020    Diarrhea    Other hydronephrosis    History of deep vein thrombosis (DVT) of lower extremity    Urinary retention 08/14/2019   History of CVA with residual deficit    BPH (benign prostatic hyperplasia)    Orthostatic hypotension 08/13/2019   Hyperlipidemia    Gait abnormality 11/22/2017   UTI (urinary tract infection) 04/22/2017   Acute lower UTI 04/22/2017   Tremor 04/22/2017   Acute pyelonephritis 09/29/2012   Nausea 09/29/2012   Fever 09/29/2012   HTN (hypertension) 09/29/2012   H/O: CVA (cerebrovascular accident) 09/29/2012   Hearing loss 09/29/2012    Past Medical History:  Diagnosis Date   BPH (benign prostatic hyperplasia)    CVA (cerebral vascular accident) (HCC)    with left sided hemiparesis   Diverticulosis    Frequency of urination    GERD (gastroesophageal reflux disease)    Kadiatou Oplinger hematuria    History of acute pyelonephritis    10-13-2012   History of CVA with residual deficit    2008--  left side of body weakness and foot drop (wears leg brace and uses cane)   History of DVT of lower extremity    2008--  cva   Hypercholesteremia    Hyperlipidemia    Hyperlipidemia    Hypertension    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 2008   Left leg DVT (HCC)    S/P insertion of IVC (inferior vena caval) filter    2008   Urethral stricture    Urgency of urination    Urinary retention    Weakness of left side of body    secondary to cva 2008  Wears glasses    Wears hearing aid    bilateral-- wears intermittantly    Past Surgical History:  Procedure Laterality Date   CYSTOSCOPY WITH RETROGRADE URETHROGRAM N/A 10/23/2015   Procedure: CYSTOSCOPY WITH RETROGRADE URETHROGRAM;  Surgeon: Barron Alvine, MD;  [facility];  Service: Urology;  Laterality: N/A;   CYSTOSCOPY WITH URETHRAL DILATATION N/A 10/23/2015   Procedure: CYSTOSCOPY WITH URETHRAL BALLOON DILATATION;  Surgeon: Barron Alvine, MD;  Location: [facility];  Service:  Urology;  Laterality: N/A;  BALLOON DILATION    IVC FILTER PLACEMENT ([facility] HX)  2008    Social History   Socioeconomic History   Marital status: Married    Spouse name: [name]   Number of children: 1   Years of education: 12   Highest education level: High school graduate  Occupational History   Occupation: retired    Comment: Art therapist  Tobacco Use   Smoking status: Former Smoker    Years: 10.00    Types: Cigarettes    Quit date: 10/26/1970    Years since quitting: 50.2   Smokeless tobacco: Never Used  Vaping Use   Vaping Use: Never used  Substance and Sexual Activity   Alcohol use: No   Drug use: No   Sexual activity: Not on file  Other Topics Concern   Not on file  Social History Narrative   Lives in [place] with wife. He is a Cytogeneticist.   Has one son who lives in [place]. He is healthy.   [facility] [place], [place].      Patient is right-handed. He lives with his wife in a one level home.   Social Determinants of Health   Financial Resource Strain: Medium Risk   Difficulty of Paying Living Expenses: Somewhat hard  Food Insecurity: Food Insecurity Present   Worried About Programme researcher, broadcasting/film/video in the Last Year: Often true   Barista in the Last Year: Often true  Transportation Needs: No Transportation Needs   Lack of Transportation (Medical): No   Lack of Transportation (Non-Medical): No  Physical Activity: Not on file  Stress: Not on file  Social Connections: Not on file  Intimate Partner Violence: Not on file    Family History  Problem Relation Age of Onset   Diabetes Sister    Lung cancer Brother        Post 9/11 voluntary work in [employer]   Prostate cancer Brother    Other Mother        passed away young- non medical   Alzheimer's disease Father    Healthy Son     Medications Prior to Admission  Medication Sig Dispense Refill Last Dose   acetaminophen (TYLENOL) 500 MG tablet Take 1,000 mg by mouth every 6 (six)  hours as needed for moderate pain.   01/04/2021 at Unknown time   apixaban (ELIQUIS) 5 MG TABS tablet Take 1 tablet (5 mg total) by mouth 2 (two) times daily. 60 tablet 0 01/04/2021 at 10 am   busPIRone (BUSPAR) 7.5 MG tablet Take 7.5 mg by mouth 2 (two) times daily.   01/04/2021 at Unknown time   Carbidopa-Levodopa ER (SINEMET CR) 25-100 MG tablet controlled release TAKE TWO TABLETS BY MOUTH THREE TIMES DAILY  (Patient taking differently: Take 1-2 tablets by mouth See admin instructions. Takes 2 tablets in the morning, 2 tablets in the afternoon, and 1 tablet at night) 180 tablet 3 01/04/2021 at Unknown time  loratadine (CLARITIN) 10 MG tablet Take 10 mg by mouth daily.   01/03/2021 at Unknown time   losartan (COZAAR) 50 MG tablet Take 50 mg by mouth daily.   01/04/2021 at Unknown time   omeprazole (PRILOSEC) 20 MG capsule Take 20 mg by mouth daily as needed (heartburn).   01/04/2021 at Unknown time   Pimavanserin Tartrate (NUPLAZID) 34 MG CAPS Take 34 mg by mouth daily.   01/04/2021 at Unknown time   polyethylene glycol (MIRALAX) packet Take 17 g by mouth daily. (Patient taking differently: Take 17 g by mouth daily as needed for mild constipation.) 14 each 0 01/04/2021 at Unknown time   rasagiline (AZILECT) 1 MG TABS tablet Take 1 mg by mouth daily.   01/04/2021 at Unknown time   Simethicone 250 MG CAPS Take 250 mg by mouth daily as needed (bloating).   01/03/2021 at Unknown time   simvastatin (ZOCOR) 20 MG tablet Take 20 mg by mouth every evening.   01/03/2021 at Unknown time   tamsulosin (FLOMAX) 0.4 MG CAPS capsule Take 0.4 mg by mouth daily.   01/03/2021 at Unknown time   lidocaine (LIDODERM) 5 % Place 1 patch onto the skin daily. Remove & Discard patch within 12 hours or as directed by MD (Patient not taking: Reported on 01/04/2021) 14 patch 0 Not Taking at Unknown time    Current Facility-Administered Medications  Medication Dose Route Frequency Provider Last Rate Last Admin   0.9 %  sodium chloride  infusion   Intravenous Continuous Hugelmeyer, Alexis, DO 75 mL/hr at 01/09/21 0610 New Bag at 01/09/21 0610   [MAR Hold] acetaminophen (TYLENOL) tablet 650 mg  650 mg Oral Q6H PRN Hugelmeyer, Alexis, DO       Or   [MAR Hold] acetaminophen (TYLENOL) suppository 650 mg  650 mg Rectal Q6H PRN Hugelmeyer, Alexis, DO       [MAR Hold] albuterol (PROVENTIL) (2.5 MG/3ML) 0.083% nebulizer solution 2.5 mg  2.5 mg Nebulization Q6H PRN Hugelmeyer, Alexis, DO       [MAR Hold] bisacodyl (DULCOLAX) EC tablet 5 mg  5 mg Oral Daily PRN Hugelmeyer, Alexis, DO       [MAR Hold] busPIRone (BUSPAR) tablet 7.5 mg  7.5 mg Oral BID Kathlen Mody, MD   7.5 mg at 01/08/21 2038   [MAR Hold] Carbidopa-Levodopa ER (SINEMET CR) 25-100 MG tablet controlled release 1 tablet  1 tablet Oral QHS Kathlen Mody, MD   1 tablet at 01/08/21 2038   [MAR Hold] Carbidopa-Levodopa ER (SINEMET CR) 25-100 MG tablet controlled release 2 tablet  2 tablet Oral BID Kathlen Mody, MD   2 tablet at 01/08/21 1623   cefTRIAXone (ROCEPHIN) 1 g in dextrose 5 % 50 mL IVPB  1 g Intravenous Once Karie Soda, MD       [facility] Hold] cefTRIAXone (ROCEPHIN) 1 g in sodium chloride 0.9 % 100 mL IVPB  1 g Intravenous Q24H Hugelmeyer, Alexis, DO 200 mL/hr at 01/08/21 2042 1 g at 01/08/21 2042   [MAR Hold] Chlorhexidine Gluconate Cloth 2 % PADS 6 each  6 each Topical Daily Kathlen Mody, MD   6 each at 01/08/21 0933   [facility] Hold] ipratropium (ATROVENT) nebulizer solution 0.5 mg  0.5 mg Nebulization Q6H PRN Hugelmeyer, Alexis, DO       lactated ringers infusion   Intravenous Continuous Karie Soda, MD 10 mL/hr at 01/09/21 0828 Continued from Pre-op at 01/09/21 0828   [MAR Hold] losartan (COZAAR) tablet 50 mg  50 mg Oral Daily Hugelmeyer,  Alexis, DO   50 mg at 01/08/21 0931   [MAR Hold] magnesium citrate solution 1 Bottle  1 Bottle Oral Once PRN Hugelmeyer, Alexis, DO       [MAR Hold] morphine 2 MG/ML injection 1 mg  1 mg Intravenous Q6H PRN Hugelmeyer, Alexis, DO        [MAR Hold] ondansetron (ZOFRAN) tablet 4 mg  4 mg Oral Q6H PRN Hugelmeyer, Alexis, DO       Or   [MAR Hold] ondansetron (ZOFRAN) injection 4 mg  4 mg Intravenous Q6H PRN Hugelmeyer, Alexis, DO       [MAR Hold] pantoprazole (PROTONIX) EC tablet 40 mg  40 mg Oral Daily Kathlen Mody, MD   40 mg at 01/08/21 0932   [MAR Hold] Pimavanserin Tartrate CAPS 34 mg  34 mg Oral Daily Kathlen Mody, MD   34 mg at 01/08/21 1114   [MAR Hold] polyethylene glycol (MIRALAX / GLYCOLAX) packet 17 g  17 g Oral Daily PRN Kathlen Mody, MD       [facility] Hold] rasagiline (AZILECT) tablet 1 mg  1 mg Oral Daily Kathlen Mody, MD   1 mg at 01/08/21 0932   [MAR Hold] senna-docusate (Senokot-S) tablet 1 tablet  1 tablet Oral QHS PRN Hugelmeyer, Alexis, DO       [MAR Hold] simvastatin (ZOCOR) tablet 20 mg  20 mg Oral QPM Hugelmeyer, Alexis, DO   20 mg at 01/08/21 1623   sodium chloride 0.9 % with cefTRIAXone (ROCEPHIN) ADS Med            [MAR Hold] tamsulosin (FLOMAX) capsule 0.4 mg  0.4 mg Oral Daily Hugelmeyer, Alexis, DO   0.4 mg at 01/08/21 0931   [MAR Hold] traMADol (ULTRAM) tablet 50 mg  50 mg Oral Q6H PRN Hugelmeyer, Alexis, DO       [MAR Hold] traZODone (DESYREL) tablet 25 mg  25 mg Oral QHS PRN Hugelmeyer, Alexis, DO         Allergies  Allergen Reactions   Propofol Other (See Comments)    "hiccups for weeks"   Lisinopril     Other reaction(s): Cough    BP (!) 151/79   Pulse 80   Temp 98.6 F (37 C) (Oral)   Resp 16   Ht 6\' 2"  (1.88 m)   Wt 110.7 kg   SpO2 96%   BMI 31.33 kg/m   Labs: Results for orders placed or performed during the hospital encounter of 01/04/21 (from the past 48 hour(s))  CBC     Status: None   Collection Time: 01/09/21  3:50 AM  Result Value Ref Range   WBC 6.9 4.0 - 10.5 K/uL   RBC 4.55 4.22 - 5.81 MIL/uL   Hemoglobin 13.4 13.0 - 17.0 g/dL   HCT 40.9 81.1 - 91.4 %   MCV 92.1 80.0 - 100.0 fL   MCH 29.5 26.0 - 34.0 pg   MCHC 32.0 30.0 - 36.0 g/dL   RDW 78.2 95.6 - 21.3 %    Platelets 322 150 - 400 K/uL   nRBC 0.0 0.0 - 0.2 %    Comment: Performed at [facility], [address], [place], [place] [address]    Imaging / Studies: CT Abdomen Pelvis W Contrast  Result Date: 01/04/2021 CLINICAL DATA:  Fever, vomiting EXAM: CT ABDOMEN AND PELVIS WITH CONTRAST TECHNIQUE: Multidetector CT imaging of the abdomen and pelvis was performed using the standard protocol following bolus administration of intravenous contrast. CONTRAST:  OMNIPAQUE IOHEXOL 300  MG/ML  SOLN COMPARISON:  11/09/2020 FINDINGS: Lower chest: Coronary artery calcifications. Main pulmonary trunk measures 3.3 cm in diameter. No basilar airspace opacity. Hepatobiliary: There are numerous gallstones within the gallbladder lumen, many of which containing gas. Gallbladder is mildly distended. No definite pericholecystic inflammatory changes. Mild intrahepatic biliary dilatation. No focal liver lesion identified on unenhanced imaging. Pancreas: Peripancreatic fat stranding throughout the length of the pancreatic body and tail. No peripancreatic fluid collections. Spleen: Normal in size without focal abnormality. Adrenals/Urinary Tract: Unremarkable adrenal glands. Kidneys enhance symmetrically without suspicious lesion, stone, or hydronephrosis. Probable subcentimeter left renal cyst. Ureters are nondilated. Urinary bladder decompressed by Foley catheter. Small amount of fluid within known right-sided bladder diverticula. Stomach/Bowel: Stomach is within normal limits. Appendix appears normal (series 5, image 65). No evidence of bowel wall thickening, distention, or inflammatory changes. Mild-moderate volume of stool throughout the colon. Vascular/Lymphatic: Atherosclerosis throughout the aortoiliac axis without aneurysm. IVC filter noted. No lymphadenopathy. Reproductive: Brachytherapy seeds within the prostate gland. Other: No free fluid. No abdominopelvic fluid collection. No pneumoperitoneum. No  abdominal wall hernia. Musculoskeletal: No acute osseous findings. Flowing anterior endplate osteophytes throughout the thoracic and lumbar spine suggesting diffuse idiopathic skeletal hyperostosis or possibly ankylosing spondylitis. Degenerative changes of the bilateral hips. Partial ankylosis of the sacroiliac joints. IMPRESSION: 1. Findings suggestive of mild acute pancreatitis. 2. Cholelithiasis with mild gallbladder distention. No definite pericholecystic inflammatory changes. If there is clinical concern for acute cholecystitis, a right upper quadrant ultrasound could be performed for further evaluation. 3. Mild intrahepatic biliary dilatation. 4. Mild-moderate volume of stool throughout the colon. 5. Mildly dilated main pulmonary trunk suggesting pulmonary arterial hypertension. 6. Aortic atherosclerosis (ICD10-I70.0). Electronically Signed   By: Duanne Guess D.O.   On: 01/04/2021 19:26   DG Chest Port 1 View  Result Date: 01/04/2021 CLINICAL DATA:  Fever, vomiting EXAM: PORTABLE CHEST 1 VIEW COMPARISON:  11/27/2020 FINDINGS: The heart size and mediastinal contours are within normal limits. Both lungs are clear. The visualized skeletal structures are unremarkable. IMPRESSION: No active disease. Electronically Signed   By: Sharlet Salina M.D.   On: 01/04/2021 17:59   ECHOCARDIOGRAM COMPLETE  Result Date: 01/08/2021    ECHOCARDIOGRAM REPORT   Patient Name:   STARSKY OZAETA Date of Exam: 01/08/2021 Medical Rec #:  [id number]      Height:       74.0 in Accession #:    [id number]     Weight:       244.0 lb Date of Birth:  Sep 21, 1946      BSA:          2.365 m Patient Age:    74 years       BP:           152/82 mmHg Patient Gender: M              HR:           77 bpm. Exam Location:  Inpatient Procedure: 2D Echo Indications:    Pre-operative Cardiocascular examination Z01.810  History:        Patient has no prior history of Echocardiogram examinations.                 Risk Factors:Hypertension and  Dyslipidemia.  Sonographer:    Thurman Coyer RDCS (AE) Referring Phys: [id number] [facility] BHAGAT IMPRESSIONS  1. Left ventricular ejection fraction, by estimation, is 55 to 60%. The left ventricle has normal function. The left ventricle has no regional wall motion abnormalities. There is  moderate concentric left ventricular hypertrophy. Left ventricular diastolic parameters are consistent with Grade I diastolic dysfunction (impaired relaxation).  2. Right ventricular systolic function is normal. The right ventricular size is normal.  3. The mitral valve is grossly normal. Trivial mitral valve regurgitation. No evidence of mitral stenosis.  4. The aortic valve is tricuspid. Aortic valve regurgitation is not visualized. No aortic stenosis is present. FINDINGS  Left Ventricle: Left ventricular ejection fraction, by estimation, is 55 to 60%. The left ventricle has normal function. The left ventricle has no regional wall motion abnormalities. The left ventricular internal cavity size was normal in size. There is  moderate concentric left ventricular hypertrophy. Left ventricular diastolic parameters are consistent with Grade I diastolic dysfunction (impaired relaxation). Right Ventricle: The right ventricular size is normal. No increase in right ventricular wall thickness. Right ventricular systolic function is normal. Left Atrium: Left atrial size was normal in size. Right Atrium: Right atrial size was normal in size. Pericardium: Trivial pericardial effusion is present. Mitral Valve: The mitral valve is grossly normal. Trivial mitral valve regurgitation. No evidence of mitral valve stenosis. Tricuspid Valve: The tricuspid valve is grossly normal. Tricuspid valve regurgitation is trivial. No evidence of tricuspid stenosis. Aortic Valve: The aortic valve is tricuspid. Aortic valve regurgitation is not visualized. No aortic stenosis is present. Pulmonic Valve: The pulmonic valve was grossly normal. Pulmonic valve  regurgitation is mild. No evidence of pulmonic stenosis. Aorta: The aortic root and ascending aorta are structurally normal, with no evidence of dilitation. Venous: The inferior vena cava was not well visualized. IAS/Shunts: The atrial septum is grossly normal.  LEFT VENTRICLE PLAX 2D LVIDd:         4.50 cm  Diastology LVIDs:         3.10 cm  LV e' medial:    5.11 cm/s LV PW:         1.30 cm  LV E/e' medial:  11.1 LV IVS:        1.40 cm  LV e' lateral:   6.75 cm/s LVOT diam:     2.30 cm  LV E/e' lateral: 8.4 LV SV:         55 LV SV Index:   23 LVOT Area:     4.15 cm  RIGHT VENTRICLE RV S prime:     10.10 cm/s TAPSE (M-mode): 1.0 cm LEFT ATRIUM             Index       RIGHT ATRIUM           Index LA diam:        3.40 cm 1.44 cm/m  RA Area:     12.20 cm LA Vol (A2C):   53.2 ml 22.49 ml/m RA Volume:   21.40 ml  9.05 ml/m LA Vol (A4C):   44.3 ml 18.73 ml/m LA Biplane Vol: 51.9 ml 21.94 ml/m  AORTIC VALVE LVOT Vmax:   72.40 cm/s LVOT Vmean:  48.100 cm/s LVOT VTI:    0.132 m  AORTA Ao Root diam: 3.80 cm MITRAL VALVE               TRICUSPID VALVE MV Area (PHT): 2.60 cm    TR Peak grad:   22.3 mmHg MV Decel Time: 292 msec    TR Vmax:        236.00 cm/s MV E velocity: 56.80 cm/s MV A velocity: 78.10 cm/s  SHUNTS MV E/A ratio:  0.73        Systemic  VTI:  0.13 m                            Systemic Diam: 2.30 cm Lennie Odor MD Electronically signed by Lennie Odor MD Signature Date/Time: 01/08/2021/4:16:21 PM    Final    US Abdomen Limited RUQ (LIVER/GB)  Result Date: 01/05/2021 CLINICAL DATA:  Acute pancreatitis EXAM: ULTRASOUND ABDOMEN LIMITED RIGHT UPPER QUADRANT COMPARISON:  None. FINDINGS: Gallbladder: Visualization limited by a shadowing from numerous gallstones. A negative sonographic Eulah Pont sign was reported by the sonographer. No pericholecystic fluid or gallbladder wall thickening. Common bile duct: Diameter: 4 mm Liver: No focal lesion identified. Within normal limits in parenchymal echogenicity. Portal  vein is patent on color Doppler imaging with normal direction of blood flow towards the liver. Other: None. IMPRESSION: Cholelithiasis without other evidence of acute cholecystitis. Electronically Signed   By: Deatra Robinson M.D.   On: 01/05/2021 00:37     .Ardeth Sportsman, M.D., F.A.C.S. Gastrointestinal and Minimally Invasive Surgery [facility] [address] [place], [place] [address] [phone] Main / Paging  01/09/2021 9:39 AM    Ardeth Sportsman

## 2021-01-09 NOTE — Progress Notes (Addendum)
Called and spoke to wife, [name]. Informed her that patients surgery start time was delayed due to Dr.Gross's previous case ran over by 1 hour.  Informed her the patient should be going to surgery in the next 30 minutes or so. Wife voiced understanding. Offered to give the wife the case # so she could follow the patient on the waiting room screens but she states the volunteer already gave her the number.

## 2021-01-09 NOTE — Discharge Instructions (Signed)
LAPAROSCOPIC SURGERY: POST OP INSTRUCTIONS  ######################################################################  EAT Gradually transition to a high fiber diet with a fiber supplement over the next few weeks after discharge.  Start with a pureed / full liquid diet (see below)  WALK Walk an hour a day.  Control your pain to do that.    CONTROL PAIN Control pain so that you can walk, sleep, tolerate sneezing/coughing, go up/down stairs.  HAVE A BOWEL MOVEMENT DAILY Keep your bowels regular to avoid problems.  OK to try a laxative to override constipation.  OK to use an antidairrheal to slow down diarrhea.  Call if not better after 2 tries  CALL IF YOU HAVE PROBLEMS/CONCERNS Call if you are still struggling despite following these instructions. Call if you have concerns not answered by these instructions  ######################################################################    1. DIET: Follow a light bland diet & liquids the first 24 hours after arrival home, such as soup, liquids, starches, etc.  Be sure to drink plenty of fluids.  Quickly advance to a usual solid diet within a few days.  Avoid fast food or heavy meals as your are more likely to get nauseated or have irregular bowels.  A low-fat, high-fiber diet for the rest of your life is ideal.  2. Take your usually prescribed home medications unless otherwise directed.  3. PAIN CONTROL: a. Pain is best controlled by a usual combination of three different methods TOGETHER: i. Ice/Heat ii. Over the counter pain medication iii. Prescription pain medication b. Most patients will experience some swelling and bruising around the incisions.  Ice packs or heating pads (30-60 minutes up to 6 times a day) will help. Use ice for the first few days to help decrease swelling and bruising, then switch to heat to help relax tight/sore spots and speed recovery.  Some people prefer to use ice alone, heat alone, alternating between ice & heat.   Experiment to what works for you.  Swelling and bruising can take several weeks to resolve.   c. It is helpful to take an over-the-counter pain medication regularly for the first few weeks.  Choose one of the following that works best for you: i. Naproxen (Aleve, etc)  Two 220mg tabs twice a day ii. Ibuprofen (Advil, etc) Three 200mg tabs four times a day (every meal & bedtime) iii. Acetaminophen (Tylenol, etc) 500-650mg four times a day (every meal & bedtime) d. A  prescription for pain medication (such as oxycodone, hydrocodone, tramadol, gabapentin, methocarbamol, etc) should be given to you upon discharge.  Take your pain medication as prescribed.  i. If you are having problems/concerns with the prescription medicine (does not control pain, nausea, vomiting, rash, itching, etc), please call us [phone] to see if we need to switch you to a different pain medicine that will work better for you and/or control your side effect better. ii. If you need a refill on your pain medication, please give us 48 hour notice.  contact your pharmacy.  They will contact our office to request authorization. Prescriptions will not be filled after 5 pm or on week-ends  4. Avoid getting constipated.   a. Between the surgery and the pain medications, it is common to experience some constipation.   b. Increasing fluid intake and taking a fiber supplement (such as Metamucil, Citrucel, FiberCon, MiraLax, etc) 1-2 times a day regularly will usually help prevent this problem from occurring.   c. A mild laxative (prune juice, Milk of Magnesia, MiraLax, etc) should be taken according to   package directions if there are no bowel movements after 48 hours.   5. Watch out for diarrhea.   a. If you have many loose bowel movements, simplify your diet to bland foods & liquids for a few days.   b. Stop any stool softeners and decrease your fiber supplement.   c. Switching to mild anti-diarrheal medications (Kayopectate, Pepto  Bismol) can help.   d. If this worsens or does not improve, please call us.  6. Wash / shower every day.  You may shower over the dressings as they are waterproof.  Continue to shower over incision(s) after the dressing is off.  7. Remove your waterproof bandages 3 days after surgery.  You may leave the incision open to air.  You may replace a dressing/Band-Aid to cover the incision for comfort if you wish.   8. ACTIVITIES as tolerated:   i. You may resume regular (light) daily activities beginning the next day--such as daily self-care, walking, climbing stairs--gradually increasing activities as tolerated.  If you can walk 30 minutes without difficulty, it is safe to try more intense activity such as jogging, treadmill, bicycling, low-impact aerobics, swimming, etc. ii. Save the most intensive and strenuous activity for last such as sit-ups, heavy lifting, contact sports, etc  Refrain from any heavy lifting or straining until you are off narcotics for pain control.   iii. DO NOT PUSH THROUGH PAIN.  Let pain be your guide: If it hurts to do something, don't do it.  Pain is your body warning you to avoid that activity for another week until the pain goes down. iv. You may drive when you are no longer taking prescription pain medication, you can comfortably wear a seatbelt, and you can safely maneuver your car and apply brakes. v. You may have sexual intercourse when it is comfortable.  9. FOLLOW UP in our office a. Please call [facility] at [phone] to set up an appointment to see your surgeon in the office for a follow-up appointment approximately 2-3 weeks after your surgery. b. Make sure that you call for this appointment the day you arrive home to insure a convenient appointment time.  10. IF YOU HAVE DISABILITY OR FAMILY LEAVE FORMS, BRING THEM TO THE OFFICE FOR PROCESSING.  DO NOT GIVE THEM TO YOUR DOCTOR.   WHEN TO CALL us [phone]: 1. Poor pain control 2. Reactions / problems with  new medications (rash/itching, nausea, etc)  3. Fever over 101.5 F (38.5 C) 4. Inability to urinate 5. Nausea and/or vomiting 6. Worsening swelling or bruising 7. Continued bleeding from incision. 8. Increased pain, redness, or drainage from the incision   The clinic staff is available to answer your questions during regular business hours (8:30am-5pm).  Please don't hesitate to call and ask to speak to one of our nurses for clinical concerns.   If you have a medical emergency, go to the nearest emergency room or call 911.  A surgeon from [facility] is always on call at the [facility], [place], [place], [place], [place]  [id number] ? MAIN: [phone] ? TOLL FREE: [phone] ?  FAX [phone] [email or web address]

## 2021-01-09 NOTE — Progress Notes (Signed)
Paged Dr. Johney Maine for diet change orders for this patient. No answer from doctor. Will continue to try and reach surgeon from new diet orders.

## 2021-01-09 NOTE — Transfer of Care (Signed)
Immediate Anesthesia Transfer of Care Note  Patient: Robert [name]  Procedure(s) Performed: LAPAROSCOPIC CHOLECYSTECTOMY WITH IOC (N/A Abdomen)  Patient Location: PACU  Anesthesia Type:General  Level of Consciousness: awake, drowsy and patient cooperative  Airway & Oxygen Therapy: Patient Spontanous Breathing and Patient connected to face mask oxygen  Post-op Assessment: Report given to RN and Post -op Vital signs reviewed and stable  Post vital signs: Reviewed and stable  Last Vitals:  Vitals Value Taken Time  BP 126/69 01/09/21 1224  Temp 36.9 C 01/09/21 1224  Pulse 105 01/09/21 1229  Resp 20 01/09/21 1229  SpO2 98 % 01/09/21 1229  Vitals shown include unvalidated device data.  Last Pain:  Vitals:   01/09/21 0820  TempSrc:   PainSc: 0-No pain         Complications: No complications documented.

## 2021-01-09 NOTE — Plan of Care (Signed)
  Problem: Education: Goal: Knowledge of General Education information will improve Description Including pain rating scale, medication(s)/side effects and non-pharmacologic comfort measures Outcome: Progressing   

## 2021-01-09 NOTE — Anesthesia Postprocedure Evaluation (Signed)
Anesthesia Post Note  Patient: Robert [name]  Procedure(s) Performed: LAPAROSCOPIC CHOLECYSTECTOMY WITH IOC (N/A Abdomen)     Patient location during evaluation: PACU Anesthesia Type: General Level of consciousness: awake and alert Pain management: pain level controlled Vital Signs Assessment: post-procedure vital signs reviewed and stable Respiratory status: spontaneous breathing, nonlabored ventilation, respiratory function stable and patient connected to nasal cannula oxygen Cardiovascular status: blood pressure returned to baseline and stable Postop Assessment: no apparent nausea or vomiting Anesthetic complications: no   No complications documented.  Last Vitals:  Vitals:   01/09/21 1327 01/09/21 1355  BP:  (!) 146/86  Pulse:  (!) 109  Resp:  16  Temp: 37.2 C 36.7 C  SpO2:  98%    Last Pain:  Vitals:   01/09/21 1254  TempSrc:   PainSc: Asleep                 Belenda Cruise P Daquisha Clermont

## 2021-01-09 NOTE — Anesthesia Procedure Notes (Signed)
Procedure Name: Intubation Date/Time: 01/09/2021 10:35 AM Performed by: West Pugh, CRNA Pre-anesthesia Checklist: Patient identified, Emergency Drugs available, Suction available, Patient being monitored and Timeout performed Patient Re-evaluated:Patient Re-evaluated prior to induction Oxygen Delivery Method: Circle system utilized Preoxygenation: Pre-oxygenation with 100% oxygen Induction Type: IV induction Ventilation: Mask ventilation without difficulty and Oral airway inserted - appropriate to patient size Laryngoscope Size: Mac and 4 Grade View: Grade I Tube type: Oral Tube size: 7.5 mm Number of attempts: 1 Airway Equipment and Method: Stylet Placement Confirmation: ETT inserted through vocal cords under direct vision,  positive ETCO2,  CO2 detector and breath sounds checked- equal and bilateral Secured at: 22 cm Tube secured with: Tape Dental Injury: Teeth and Oropharynx as per pre-operative assessment

## 2021-01-09 NOTE — Anesthesia Preprocedure Evaluation (Addendum)
Anesthesia Evaluation  Patient identified by MRN, date of birth, ID band Patient awake    Reviewed: Allergy & Precautions, NPO status , Patient's Chart, lab work & pertinent test results  Airway Mallampati: II  TM Distance: >3 FB Neck ROM: Full    Dental  (+) Poor Dentition, Missing   Pulmonary neg pulmonary ROS, former smoker,    Pulmonary exam normal        Cardiovascular hypertension, Pt. on medications  Rhythm:Regular Rate:Normal     Neuro/Psych CVA (2008, left sided weakness), Residual Symptoms negative psych ROS   GI/Hepatic Neg liver ROS, GERD  Medicated,Gallstone pancreatitis    Endo/Other  negative endocrine ROS  Renal/GU Renal disease Bladder dysfunction  urgency    Musculoskeletal negative musculoskeletal ROS (+)   Abdominal (+)  Abdomen: soft. Bowel sounds: normal.  Peds  Hematology negative hematology ROS (+)   Anesthesia Other Findings   Reproductive/Obstetrics                            Anesthesia Physical Anesthesia Plan  ASA: III  Anesthesia Plan: General   Post-op Pain Management:    Induction: Intravenous  PONV Risk Score and Plan: 2 and Ondansetron, Dexamethasone and Treatment may vary due to age or medical condition  Airway Management Planned: Mask and Oral ETT  Additional Equipment: None  Intra-op Plan:   Post-operative Plan: Extubation in OR  Informed Consent: I have reviewed the patients History and Physical, chart, labs and discussed the procedure including the risks, benefits and alternatives for the proposed anesthesia with the patient or authorized representative who has indicated his/her understanding and acceptance.     Dental advisory given  Plan Discussed with: CRNA  Anesthesia Plan Comments: (Lab Results      Component                Value               Date                      WBC                      6.9                 01/09/2021                 HGB                      13.4                01/09/2021                HCT                      41.9                01/09/2021                MCV                      92.1                01/09/2021                PLT  322                 01/09/2021           Lab Results      Component                Value               Date                      NA                       138                 01/07/2021                K                        3.7                 01/07/2021                CO2                      24                  01/07/2021                GLUCOSE                  159 (H)             01/07/2021                BUN                      8                   01/07/2021                CREATININE               0.90                01/07/2021                CALCIUM                  8.5 (L)             01/07/2021                GFRNONAA                 >60                 01/07/2021                GFRAA                    >60                 09/28/2019          )       Anesthesia Quick Evaluation

## 2021-01-09 NOTE — Progress Notes (Signed)
[facility]  PROGRESS NOTE  Robert [name] JJO:[id number] DOB: 07-28-1946 DOA: 01/04/2021 PCP: [name], MD   Brief HPI:   75 year old male with history of CVA with residual left-sided hemiparesis, GERD, DVT on Eliquis, hypertension, hyperlipidemia, chronic indwelling Foley catheter changed on 01/05/2021, Parkinson disease came to ED with complaints of vomiting.  In the ED was found to have acute pancreatitis and abnormal UA.  CT also showed numerous gallstones with no gallbladder, mildly distended, no pericholecystic inflammatory changes.  Patient's abdominal pain resolved, pancreatic enzymes normalized.  Surgery was consulted for possible gallstone pancreatitis.  Plan for lap chole on Thursday pending cardiology clearance.   Subjective   Patient seen and examined, s/p laparoscopic cholecystectomy today.  He has Foley catheter in place which was replaced on 01/05/2021.  As per wife urine has been leaking around Foley catheter   Assessment/Plan:     1. Gallstone pancreatitis-liver enzymes have improved, lipase is down to 36.  Ultrasound abdomen showed multiple gallstones in the gallbladder lumen, no pericholecystic fluid.  General surgery consulted, patient underwent laparoscopic cholecystectomy today.  Continue morphine as needed for pain 2. Asymptomatic bacteriuria versus UTI-patient has chronic indwelling Foley catheter, abnormal UA.  Lactic acid was 1.0 on admission.  Urine culture was not obtained at the time of admission.  He has been on antibiotics since 01/04/2021.  WBC is down to 8.3.  Completed 5 days of treatment with IV antibiotics.   3. History of CVA with left hemiparesis-PT evaluation done, recommend skilled nursing facility on discharge. 4. Hypertension-blood pressure is stable, continue losartan 5. History of Parkinson disease-continue carbidopa levodopa, rasagiline. 6. History of DVT-s/p IVC filter, Eliquis for anticoagulation 7. Chronic indwelling Foley catheter-has  been leaking around Foley catheter which was replaced on 01/22/2009, we will replace Foley catheter with bigger size.     COVID-19 Labs  No results for input(s): DDIMER, FERRITIN, LDH, CRP in the last 72 hours.  Lab Results  Component Value Date   SARSCOV2NAA NEGATIVE 01/04/2021   [place] NEGATIVE 11/27/2020   [place] NEGATIVE 11/09/2020   SARSCOV2NAA NOT DETECTED 08/18/2019     Scheduled medications:   . busPIRone  7.5 mg Oral BID  . Carbidopa-Levodopa ER  1 tablet Oral QHS  . Carbidopa-Levodopa ER  2 tablet Oral BID  . Chlorhexidine Gluconate Cloth  6 each Topical Daily  . losartan  50 mg Oral Daily  . pantoprazole  40 mg Oral Daily  . Pimavanserin Tartrate  34 mg Oral Daily  . rasagiline  1 mg Oral Daily  . simvastatin  20 mg Oral QPM  . tamsulosin  0.4 mg Oral Daily         CBG: Recent Labs  Lab 01/04/21 1710  GLUCAP 179*    SpO2: 98 % O2 Flow Rate (L/min): 2 L/min    CBC: Recent Labs  Lab 01/04/21 1710 01/05/21 0454 01/06/21 0336 01/07/21 0340 01/09/21 0350  WBC 13.3* 17.9* 13.7* 8.3 6.9  NEUTROABS 10.8*  --  10.5*  --   --   HGB 14.9 14.6 13.2 12.8* 13.4  HCT 46.2 45.0 41.3 40.1 41.9  MCV 91.8 90.4 92.6 92.4 92.1  PLT 270 280 258 258 160    Basic Metabolic Panel: Recent Labs  Lab 01/04/21 1710 01/05/21 0454 01/06/21 0336 01/07/21 0340  NA 133* 133* 133* 138  K 3.9 4.0 3.7 3.7  CL 99 101 101 106  CO2 21* 24 23 24   GLUCOSE 196* 159* 150* 159*  BUN 8 8 8  8  CREATININE 0.93 0.91 0.79 0.90  CALCIUM 8.9 8.7* 8.5* 8.5*  MG  --   --  1.9  --      Liver Function Tests: Recent Labs  Lab 01/04/21 1710 01/05/21 0454 01/07/21 0340  AST 138* 78* 25  ALT 68* 42 30  ALKPHOS 131* 118 82  BILITOT 2.9* 2.2* 1.2  PROT 7.1 6.8 6.1*  ALBUMIN 3.5 3.3* 2.8*     Antibiotics: Anti-infectives (From admission, onward)   Start     Dose/Rate Route Frequency Ordered Stop   01/09/21 1030  cefTRIAXone (ROCEPHIN) 1 g in dextrose 5 % 50 mL  IVPB  Status:  Discontinued        1 g 120 mL/hr over 30 Minutes Intravenous  Once 01/09/21 0932 01/09/21 0952   01/09/21 1030  cefTRIAXone (ROCEPHIN) 2 g in sodium chloride 0.9 % 100 mL IVPB        2 g 200 mL/hr over 30 Minutes Intravenous  Once 01/09/21 0952 01/09/21 1106   01/09/21 0934  sodium chloride 0.9 % with cefTRIAXone (ROCEPHIN) ADS Med       Note to Pharmacy: Charmayne Sheer   : cabinet override      01/09/21 0934 01/09/21 1051   01/05/21 2000  cefTRIAXone (ROCEPHIN) 1 g in sodium chloride 0.9 % 100 mL IVPB        1 g 200 mL/hr over 30 Minutes Intravenous Every 24 hours 01/05/21 0429     01/04/21 1900  cefTRIAXone (ROCEPHIN) 1 g in sodium chloride 0.9 % 100 mL IVPB        1 g 200 mL/hr over 30 Minutes Intravenous  Once 01/04/21 1857 01/04/21 2022       DVT prophylaxis: SCDs  Code Status: Full code  Family Communication: No family at bedside   Consultants:  General surgery   Cardiology  Neurology  Procedures:  Echocardiogram    Objective   Vitals:   01/09/21 1237 01/09/21 1249 01/09/21 1327 01/09/21 1355  BP:    (!) 146/86  Pulse:    (!) 109  Resp:    16  Temp:   99 F (37.2 C) 98.1 F (36.7 C)  TempSrc:      SpO2: 97% 98%  98%  Weight:      Height:        Intake/Output Summary (Last 24 hours) at 01/09/2021 1657 Last data filed at 01/09/2021 1339 Gross per 24 hour  Intake 2289.48 ml  Output 575 ml  Net 1714.48 ml    03/15 1901 - 03/17 0700 In: 1901.5 [P.O.:1062; I.V.:639.5] Out: 650 [Urine:650]  Filed Weights   01/04/21 1708  Weight: 110.7 kg    Physical Examination:   General-appears in no acute distress Heart-S1-S2, regular, no murmur auscultated Lungs-clear to auscultation bilaterally, no wheezing or crackles auscultated Abdomen-soft, nontender, no organomegaly Extremities-no edema in the lower extremities Neuro-alert, oriented x3, no focal deficit noted  Status is: Inpatient  Dispo: The patient is from: Home               Anticipated d/c is to: Home              Anticipated d/c date is: 01/13/2021              Patient currently not medically stable for discharge  Barrier to discharge-plan for lap chole in a.m.  Pressure Injury 01/05/21 Right Stage 2 -  Partial thickness loss of dermis presenting as a shallow open injury with a red, pink wound  bed without slough. (Active)  01/05/21 0500  Location:   Location Orientation: Right  Staging: Stage 2 -  Partial thickness loss of dermis presenting as a shallow open injury with a red, pink wound bed without slough.  Wound Description (Comments):   Present on Admission: Yes           Data Reviewed:   Recent Results (from the past 240 hour(s))  SARS CORONAVIRUS 2 (TAT 6-24 HRS) Nasopharyngeal Nasopharyngeal Swab     Status: None   Collection Time: 01/04/21  7:32 PM   Specimen: Nasopharyngeal Swab  Result Value Ref Range Status   SARS Coronavirus 2 NEGATIVE NEGATIVE Final    Comment: (NOTE) SARS-CoV-2 target nucleic acids are NOT DETECTED.  The SARS-CoV-2 RNA is generally detectable in upper and lower respiratory specimens during the acute phase of infection. Negative results do not preclude SARS-CoV-2 infection, do not rule out co-infections with other pathogens, and should not be used as the sole basis for treatment or other patient management decisions. Negative results must be combined with clinical observations, patient history, and epidemiological information. The expected result is Negative.  Fact Sheet for Patients: SugarRoll.be  Fact Sheet for Healthcare Providers: https://www.woods-mathews.com/  This test is not yet approved or cleared by the [place] FDA and  has been authorized for detection and/or diagnosis of SARS-CoV-2 by FDA under an Emergency Use Authorization (EUA). This EUA will remain  in effect (meaning this test can be used) for the duration of the COVID-19 declaration under  Se ction 564(b)(1) of the Act, 21 U.S.C. section 360bbb-3(b)(1), unless the authorization is terminated or revoked sooner.  Performed at [facility], [place] [address] [place], [name] [address]     Recent Labs  Lab 01/04/21 1710 01/05/21 0857 01/06/21 0336  LIPASE 279* 63* 36   No results for input(s): AMMONIA in the last 168 hours.  Cardiac Enzymes: No results for input(s): CKTOTAL, CKMB, CKMBINDEX, TROPONINI in the last 168 hours. BNP (last 3 results) Recent Labs    01/04/21 1711  BNP 111.1*    ProBNP (last 3 results) No results for input(s): PROBNP in the last 8760 hours.  Studies:  DG Cholangiogram Operative  Result Date: 01/09/2021 CLINICAL DATA:  75 year old male with cholelithiasis EXAM: INTRAOPERATIVE CHOLANGIOGRAM TECHNIQUE: Cholangiographic images from the C-arm fluoroscopic device were submitted for interpretation post-operatively. Please see the procedural report for the amount of contrast and the fluoroscopy time utilized. COMPARISON:  Ultrasound 01/05/2021 FINDINGS: Surgical instruments project over the upper abdomen. There is cannulation of the cystic duct/gallbladder neck, with antegrade infusion of contrast. Caliber of the extrahepatic ductal system within normal limits. No definite filling defect within the extrahepatic ducts identified. Free flow of contrast across the ampulla. IMPRESSION: Intraoperative cholangiogram demonstrates extrahepatic biliary ducts of unremarkable caliber, with no definite filling defects identified. Free flow of contrast across the ampulla. Please refer to the dictated operative report for full details of intraoperative findings and procedure Electronically Signed   By: Corrie Mckusick D.O.   On: 01/09/2021 12:00   ECHOCARDIOGRAM COMPLETE  Result Date: 01/08/2021    ECHOCARDIOGRAM REPORT   Patient Name:   TRAIVON MORRICAL Date of Exam: 01/08/2021 Medical Rec #:  [id number]      Height:       74.0 in Accession #:    [id number]      Weight:       244.0 lb Date of Birth:  1946/07/09      BSA:  2.365 m Patient Age:    21 years       BP:           152/82 mmHg Patient Gender: M              HR:           77 bpm. Exam Location:  Inpatient Procedure: 2D Echo Indications:    Pre-operative Cardiocascular examination Z01.810  History:        Patient has no prior history of Echocardiogram examinations.                 Risk Factors:Hypertension and Dyslipidemia.  Sonographer:    Mikki Santee RDCS (AE) Referring Phys: [id number] [place]  1. Left ventricular ejection fraction, by estimation, is 55 to 60%. The left ventricle has normal function. The left ventricle has no regional wall motion abnormalities. There is moderate concentric left ventricular hypertrophy. Left ventricular diastolic parameters are consistent with Grade I diastolic dysfunction (impaired relaxation).  2. Right ventricular systolic function is normal. The right ventricular size is normal.  3. The mitral valve is grossly normal. Trivial mitral valve regurgitation. No evidence of mitral stenosis.  4. The aortic valve is tricuspid. Aortic valve regurgitation is not visualized. No aortic stenosis is present. FINDINGS  Left Ventricle: Left ventricular ejection fraction, by estimation, is 55 to 60%. The left ventricle has normal function. The left ventricle has no regional wall motion abnormalities. The left ventricular internal cavity size was normal in size. There is  moderate concentric left ventricular hypertrophy. Left ventricular diastolic parameters are consistent with Grade I diastolic dysfunction (impaired relaxation). Right Ventricle: The right ventricular size is normal. No increase in right ventricular wall thickness. Right ventricular systolic function is normal. Left Atrium: Left atrial size was normal in size. Right Atrium: Right atrial size was normal in size. Pericardium: Trivial pericardial effusion is present. Mitral Valve: The mitral valve  is grossly normal. Trivial mitral valve regurgitation. No evidence of mitral valve stenosis. Tricuspid Valve: The tricuspid valve is grossly normal. Tricuspid valve regurgitation is trivial. No evidence of tricuspid stenosis. Aortic Valve: The aortic valve is tricuspid. Aortic valve regurgitation is not visualized. No aortic stenosis is present. Pulmonic Valve: The pulmonic valve was grossly normal. Pulmonic valve regurgitation is mild. No evidence of pulmonic stenosis. Aorta: The aortic root and ascending aorta are structurally normal, with no evidence of dilitation. Venous: The inferior vena cava was not well visualized. IAS/Shunts: The atrial septum is grossly normal.  LEFT VENTRICLE PLAX 2D LVIDd:         4.50 cm  Diastology LVIDs:         3.10 cm  LV e' medial:    5.11 cm/s LV PW:         1.30 cm  LV E/e' medial:  11.1 LV IVS:        1.40 cm  LV e' lateral:   6.75 cm/s LVOT diam:     2.30 cm  LV E/e' lateral: 8.4 LV SV:         55 LV SV Index:   23 LVOT Area:     4.15 cm  RIGHT VENTRICLE RV S prime:     10.10 cm/s TAPSE (M-mode): 1.0 cm LEFT ATRIUM             Index       RIGHT ATRIUM           Index LA diam:  3.40 cm 1.44 cm/m  RA Area:     12.20 cm LA Vol (A2C):   53.2 ml 22.49 ml/m RA Volume:   21.40 ml  9.05 ml/m LA Vol (A4C):   44.3 ml 18.73 ml/m LA Biplane Vol: 51.9 ml 21.94 ml/m  AORTIC VALVE LVOT Vmax:   72.40 cm/s LVOT Vmean:  48.100 cm/s LVOT VTI:    0.132 m  AORTA Ao Root diam: 3.80 cm MITRAL VALVE               TRICUSPID VALVE MV Area (PHT): 2.60 cm    TR Peak grad:   22.3 mmHg MV Decel Time: 292 msec    TR Vmax:        236.00 cm/s MV E velocity: 56.80 cm/s MV A velocity: 78.10 cm/s  SHUNTS MV E/A ratio:  0.73        Systemic VTI:  0.13 m                            Systemic Diam: 2.30 cm Eleonore Chiquito MD Electronically signed by Eleonore Chiquito MD Signature Date/Time: 01/08/2021/4:16:21 PM    Final        Oswald Hillock   [facility] If 7PM-7AM, please contact night-coverage  at [email or web address], Office  [phone]   01/09/2021, 4:57 PM  LOS: 4 days

## 2021-01-09 NOTE — Op Note (Signed)
01/09/2021  PATIENT:  Robert [name]  75 y.o. male  Patient Care Team: [name], MD as PCP - General (Family Medicine) Sueanne Margarita, MD as PCP - Cardiology (Cardiology) Pleasant, Eppie Gibson, RN as [place] [facility] Tat, Eustace Quail, DO as Consulting Physician (Neurology) Adrian Prows, MD as Consulting Physician (Cardiology)  PRE-OPERATIVE DIAGNOSIS:    Acute on Chronic Calculus Cholecystitis  POST-OPERATIVE DIAGNOSIS:   Acute on Chronic Calculus Cholecystitis  PROCEDURE:  Laparoscopic cholecystectomy with intraoperative cholangiogram (CPT code [phone])  SURGEON:  Adin Hector, MD, FACS.  ASSISTANT: OR Staff   ANESTHESIA:    General with endotracheal intubation Local anesthetic as a field block  EBL:  (See Anesthesia Intraoperative Record) Total I/O In: 100 [IV Piggyback:100] Out: -    Delay start of Pharmacological VTE agent (>24hrs) due to surgical blood loss or risk of bleeding:  no  DRAINS: None   SPECIMEN: Gallbladder    DISPOSITION OF SPECIMEN:  PATHOLOGY  COUNTS:  YES  PLAN OF CARE: Admit to inpatient   PATIENT DISPOSITION:  PACU - hemodynamically stable.  INDICATION: Pleasant gentleman with left hemiparesis from prior stroke and some neurogenic bladder issues.  Came with abdominal pain and has evidence of gallstone pancreatitis as well as having some urinary tract infection for his chronic Foley issues.  He was cleared from medical and cardiac standpoint.  Anticoagulation (Eliquis) he will complete hours.  Recommendation made to proceed with surgery.  The anatomy & physiology of hepatobiliary & pancreatic function was discussed.  The pathophysiology of gallbladder dysfunction was discussed.  Natural history risks without surgery was discussed.   I feel the risks of no intervention will lead to serious problems that outweigh the operative risks; therefore, I recommended cholecystectomy to remove the pathology.  I explained  laparoscopic techniques with possible need for an open approach.  Probable cholangiogram to evaluate the bilary tract was explained as well.    Risks such as bleeding, infection, abscess, leak, injury to other organs, need for further treatment, heart attack, death, and other risks were discussed.  I noted a good likelihood this will help address the problem.  Possibility that this will not correct all abdominal symptoms was explained.  Goals of post-operative recovery were discussed as well.  We will work to minimize complications.  An educational handout further explaining the pathology and treatment options was given as well.  Questions were answered.  The patient expresses understanding & wishes to proceed with surgery.  OR FINDINGS: Edema and inflammation in the setting of chronic gallbladder wall changes consistent with acute on chronic cholecystitis.  Gallbladder full with many numerous moderate-sized stones.  Some up to a 2 cm in size.  Liver without any fatty change or major concerns.  Cholangiogram with a dilated biliary system overall but no evidence of any choledocholithiasis or stricture or common bile duct obstruction.    DESCRIPTION:   Informed consent was confirmed.  The patient underwent general anaesthesia without difficulty.  The patient was positioned appropriately.  He does have chronic contractures from his dense left hemiparesis with spasticity left upper extremity especially.  We are careful to help protect that.  VTE prevention in place.  The patient's abdomen was clipped, prepped, & draped in a sterile fashion.  Surgical timeout confirmed our plan.  Peritoneal entry with a laparoscopic port was obtained using optical entry technique in the supraumbilical fascia using a Hassan cutdown technique.  Entry was clean.  I induced carbon dioxide insufflation.  Camera  inspection revealed no injury.  Extra ports were carefully placed under direct laparoscopic visualization.  I turned  attention to the right upper quadrant.  The gallbladder fundus was elevated cephalad.  Upon entering the abdomen (organ space), I encountered no purulence.  Mild GB phlegmon.  I used hook cautery to free the peritoneal coverings between the gallbladder and the liver on the posteriolateral and anteriomedial walls.   Freed the duodenal bulb and mesocolon of the transverse colon off the gallbladder anterior wall to expose the infundibulum.  I used careful blunt and hook dissection to help get a good critical view of the cystic artery and cystic duct. I did further dissection to free 50% of the gallbladder off the liver bed to get a good critical view of the infundibulum and cystic duct.  I dissected out the cystic artery; and, after getting a good 360 view, ligated the anterior & posterior branches of the cystic artery close on the infundibulum using clips and cautery   I skeletonized the cystic duct.  I placed a clip on the infundibulum. I did a partial cystic duct-otomy and ensured patency. I placed a 5 [place] cholangiocatheter through a puncture site at the right subcostal ridge of the abdominal wall and directed it into the cystic duct.  We ran a cholangiogram with dilute radio-opaque contrast and continuous fluoroscopy. Contrast flowed from a side branch consistent with cystic duct cannulization. Contrast flowed up the common hepatic duct into the right and left intrahepatic chains out to secondary radicals. Contrast flowed down the common bile duct easily across the normal ampulla into the duodenum.  This was consistent with a normal cholangiogram.  I did have to decompress the gallbladder with some spillage of stones since it was quite intrahepatic.  I removed the cholangiocatheter. I placed clips on the cystic duct x4.  I completed cystic duct transection.  I freed the gallbladder from its remaining attachments to the liver.  I ensured hemostasis on the gallbladder fossa of the liver and elsewhere. I  inspected the rest of the abdomen & detected no injury nor bleeding elsewhere.  Because he had a edematous and dilated cystic duct,  I did do a 0-PDS Endoloop for further ligation on the cystic duct stump  I placed the gallbladder inside an equal sac along with all stones and did copious irrigation.  I removed the gallbladder out the subxiphoid fascia.  I had opened up the fascia to 3 cm to get the gallbladder out with its very large stones removed the gallbladder. I closed the subxiphoid fascia transversely using #1 PDS interrupted stitches.  Did laparoscopic reinsufflation to ensure good closure without any trapped organs.  Hemostasis good on the gallbladder fossa I closed the skin using 4-0 monocryl stitch.  Sterile dressings were applied.  Because there had been some concerns about leaking around the Foley with his chronic neurogenic bladder, I discussed with Dr. Louis Meckel with urology who is going to try and help see the patient and see if the catheter need to be exchanged.  The patient was extubated & arrived in the PACU in stable condition..  I had discussed postoperative care with the patient in the holding area. I discussed operative findings, updated the patient's status, discussed probable steps to recovery, and gave postoperative recommendations to the patient's spouse., [name].    Recommendations were made.  Questions were answered.  She expressed understanding & appreciation.  Adin Hector, M.D., F.A.C.S. Gastrointestinal and Minimally Invasive Surgery [facility] [phone]  [address] [place], [place] [address] [phone] Main / Paging  01/09/2021 12:12 PM

## 2021-01-09 NOTE — Progress Notes (Signed)
Received surgery report from [place], rn

## 2021-01-10 ENCOUNTER — Encounter (HOSPITAL_COMMUNITY): Payer: Self-pay | Admitting: Surgery

## 2021-01-10 LAB — CBC
HCT: 41.4 % (ref 39.0–52.0)
Hemoglobin: 13.5 g/dL (ref 13.0–17.0)
MCH: 29.4 pg (ref 26.0–34.0)
MCHC: 32.6 g/dL (ref 30.0–36.0)
MCV: 90.2 fL (ref 80.0–100.0)
Platelets: 387 10*3/uL (ref 150–400)
RBC: 4.59 MIL/uL (ref 4.22–5.81)
RDW: 13.6 % (ref 11.5–15.5)
WBC: 13.8 10*3/uL — ABNORMAL HIGH (ref 4.0–10.5)
nRBC: 0 % (ref 0.0–0.2)

## 2021-01-10 LAB — COMPREHENSIVE METABOLIC PANEL
ALT: 48 U/L — ABNORMAL HIGH (ref 0–44)
AST: 76 U/L — ABNORMAL HIGH (ref 15–41)
Albumin: 3.1 g/dL — ABNORMAL LOW (ref 3.5–5.0)
Alkaline Phosphatase: 77 U/L (ref 38–126)
Anion gap: 9 (ref 5–15)
BUN: 10 mg/dL (ref 8–23)
CO2: 23 mmol/L (ref 22–32)
Calcium: 9.1 mg/dL (ref 8.9–10.3)
Chloride: 107 mmol/L (ref 98–111)
Creatinine, Ser: 0.89 mg/dL (ref 0.61–1.24)
GFR, Estimated: >60 mL/min (ref >60)
Glucose, Bld: 212 mg/dL — ABNORMAL HIGH (ref 70–99)
Potassium: 4.1 mmol/L (ref 3.5–5.1)
Sodium: 139 mmol/L (ref 135–145)
Total Bilirubin: 0.9 mg/dL (ref 0.3–1.2)
Total Protein: 6.6 g/dL (ref 6.5–8.1)

## 2021-01-10 LAB — SURGICAL PATHOLOGY

## 2021-01-10 MED ORDER — APIXABAN 5 MG PO TABS
5.0000 mg | ORAL_TABLET | Freq: Two times a day (BID) | ORAL | Status: DC
  Administered 2021-01-10 – 2021-01-13 (×6): 5 mg via ORAL
  Filled 2021-01-10 (×6): qty 1

## 2021-01-10 MED ORDER — POLYETHYLENE GLYCOL 3350 17 G PO PACK
17.0000 g | PACK | Freq: Two times a day (BID) | ORAL | Status: DC
  Administered 2021-01-10 – 2021-01-11 (×3): 17 g via ORAL
  Filled 2021-01-10 (×6): qty 1

## 2021-01-10 MED ORDER — BISACODYL 10 MG RE SUPP
10.0000 mg | Freq: Two times a day (BID) | RECTAL | Status: DC | PRN
  Filled 2021-01-10: qty 1

## 2021-01-10 MED ORDER — POLYETHYLENE GLYCOL 3350 17 G PO PACK: 17.0000 g | PACK | Freq: Every day | ORAL | Status: DC | PRN

## 2021-01-10 MED ORDER — SIMETHICONE 40 MG/0.6ML PO SUSP
40.0000 mg | Freq: Four times a day (QID) | ORAL | Status: DC | PRN
  Filled 2021-01-10: qty 0.6

## 2021-01-10 MED ORDER — POLYETHYLENE GLYCOL 3350 17 G PO PACK: 17.0000 g | PACK | Freq: Two times a day (BID) | ORAL | Status: DC | PRN

## 2021-01-10 MED ORDER — POLYETHYLENE GLYCOL 3350 17 G PO PACK
17.0000 g | PACK | Freq: Once | ORAL | Status: AC
  Administered 2021-01-10: 17 g via ORAL
  Filled 2021-01-10: qty 1

## 2021-01-10 NOTE — Progress Notes (Signed)
Physical Therapy Treatment Patient Details Name: Robert [name] MRN: [id number] DOB: 1946/01/09 Today's Date: 01/10/2021    History of Present Illness 75 yo male presented with fever and vomiting; admitted with sepsis 2* UTI and acute pancreatitis. Hx of dementia, CVA with L residual weakness/drop foot, DVT, IVC filter, hearing loss, Parkinson's.    PT Comments    Pt assisted with sit to stands and minisquats for LE strengthening. Pt reports dizziness however vitals: 152/101 mmHg and HR 101 bpm.  Pt repositioned in bed with pillows.  Spouse present and assisted with providing pt's typically techniques at home.   Follow Up Recommendations  SNF     Equipment Recommendations  None recommended by PT    Recommendations for Other Services       Precautions / Restrictions Precautions Precautions: Fall Required Braces or Orthoses: Other Brace Other Brace: LT double upright AFO    Mobility  Bed Mobility Overal bed mobility: Needs Assistance Bed Mobility: Supine to Sit;Sit to Supine     Supine to sit: HOB elevated;Max assist;+2 for safety/equipment Sit to supine: +2 for safety/equipment;Max assist   General bed mobility comments: assist for trunk and LEs, performed to right side which pt prefers    Transfers Overall transfer level: Needs assistance Equipment used: Hemi-walker Transfers: Sit to/from Stand Sit to Stand: Mod assist;Min guard;+2 physical assistance;From elevated surface         General transfer comment: significant assist to rise and steady with increased right lateral lean, attempted to assist and correct with pt however pt reports therapist "pushing him" so returned to sitting; performed again with a little less assist and pt able to assist more however still with right lateral lean and able to correct with posture cues; pt performed sit to stands 4x as well as minisquats 5 repetitions performed 3 times with seated rest breaks for  strengthening  Ambulation/Gait                 Stairs             Wheelchair Mobility    Modified Rankin (Stroke Patients Only)       Balance Overall balance assessment: Needs assistance Sitting-balance support: Single extremity supported Sitting balance-Leahy Scale: Poor Sitting balance - Comments: reliant on R UE support Postural control: Right lateral lean Standing balance support: Single extremity supported;During functional activity Standing balance-Leahy Scale: Poor                              Cognition Arousal/Alertness: Awake/alert Behavior During Therapy: WFL for tasks assessed/performed Overall Cognitive Status: History of cognitive impairments - at baseline                                        Exercises      General Comments        Pertinent Vitals/Pain Pain Assessment: No/denies pain    Home Living                      Prior Function            PT Goals (current goals can now be found in the care plan section) Progress towards PT goals: Progressing toward goals    Frequency    Min 2X/week      PT Plan Current plan remains appropriate    Co-evaluation  AM-PAC PT "6 Clicks" Mobility   Outcome Measure  Help needed turning from your back to your side while in a flat bed without using bedrails?: A Lot Help needed moving from lying on your back to sitting on the side of a flat bed without using bedrails?: A Lot Help needed moving to and from a bed to a chair (including a wheelchair)?: A Lot Help needed standing up from a chair using your arms (e.g., wheelchair or bedside chair)?: A Lot Help needed to walk in hospital room?: Total Help needed climbing 3-5 steps with a railing? : Total 6 Click Score: 10    End of Session Equipment Utilized During Treatment: Gait belt Activity Tolerance: Patient tolerated treatment well Patient left: in bed;with call bell/phone within  reach;with bed alarm set;with family/visitor present   PT Visit Diagnosis: Muscle weakness (generalized) (M62.81);Other symptoms and signs involving the nervous system (R29.898)     Time: 3159-4585 PT Time Calculation (min) (ACUTE ONLY): 30 min  Charges:  $Therapeutic Activity: 23-37 mins                    Jannette Spanner PT, DPT Acute Rehabilitation Services Pager: [phone] Office: [phone]  York Ram E 01/10/2021, 2:22 PM

## 2021-01-10 NOTE — Progress Notes (Signed)
Progress Note  1 Day Post-Op  Subjective: Patient reports mild abdominal soreness. Did not have diet orders overnight so has not tried any PO yet. No flatus, feels full a little. Denies nausea.   Objective: Vital signs in last 24 hours: Temp:  [97.9 F (36.6 C)-99 F (37.2 C)] 98.1 F (36.7 C) (03/18 0601) Pulse Rate:  [91-109] 91 (03/18 0601) Resp:  [16-23] 18 (03/18 0601) BP: (126-168)/(69-97) 156/88 (03/18 0601) SpO2:  [96 %-98 %] 96 % (03/18 0601) Last BM Date: 01/07/21  Intake/Output from previous day: 03/17 0701 - 03/18 0700 In: 1450 [I.V.:1350; IV Piggyback:100] Out: 1350 [Urine:1350] Intake/Output this shift: No intake/output data recorded.  PE: General: pleasant, WD, elderly male who is laying in bed in NAD Heart: regular, rate, and rhythm.  Lungs: CTAB, no wheezes, rhonchi, or rales noted.  Respiratory effort nonlabored Abd: soft, appropriately ttp, ND, +BS, incisions c/d/i MS: all 4 extremities are symmetrical with no cyanosis, clubbing, or edema. Skin: warm and dry with no masses, lesions, or rashes    Lab Results:  Recent Labs    01/09/21 0350 01/10/21 0401  WBC 6.9 13.8*  HGB 13.4 13.5  HCT 41.9 41.4  PLT 322 387   BMET Recent Labs    01/10/21 0401  NA 139  K 4.1  CL 107  CO2 23  GLUCOSE 212*  BUN 10  CREATININE 0.89  CALCIUM 9.1   PT/INR No results for input(s): LABPROT, INR in the last 72 hours. CMP     Component Value Date/Time   NA 139 01/10/2021 0401   K 4.1 01/10/2021 0401   CL 107 01/10/2021 0401   CO2 23 01/10/2021 0401   GLUCOSE 212 (H) 01/10/2021 0401   BUN 10 01/10/2021 0401   CREATININE 0.89 01/10/2021 0401   CALCIUM 9.1 01/10/2021 0401   PROT 6.6 01/10/2021 0401   ALBUMIN 3.1 (L) 01/10/2021 0401   AST 76 (H) 01/10/2021 0401   ALT 48 (H) 01/10/2021 0401   ALKPHOS 77 01/10/2021 0401   BILITOT 0.9 01/10/2021 0401   GFRNONAA >60 01/10/2021 0401   GFRAA >60 09/28/2019 2052   Lipase     Component Value  Date/Time   LIPASE 36 01/06/2021 0336       Studies/Results: DG Cholangiogram Operative  Result Date: 01/09/2021 CLINICAL DATA:  75 year old male with cholelithiasis EXAM: INTRAOPERATIVE CHOLANGIOGRAM TECHNIQUE: Cholangiographic images from the C-arm fluoroscopic device were submitted for interpretation post-operatively. Please see the procedural report for the amount of contrast and the fluoroscopy time utilized. COMPARISON:  Ultrasound 01/05/2021 FINDINGS: Surgical instruments project over the upper abdomen. There is cannulation of the cystic duct/gallbladder neck, with antegrade infusion of contrast. Caliber of the extrahepatic ductal system within normal limits. No definite filling defect within the extrahepatic ducts identified. Free flow of contrast across the ampulla. IMPRESSION: Intraoperative cholangiogram demonstrates extrahepatic biliary ducts of unremarkable caliber, with no definite filling defects identified. Free flow of contrast across the ampulla. Please refer to the dictated operative report for full details of intraoperative findings and procedure Electronically Signed   By: Corrie Mckusick D.O.   On: 01/09/2021 12:00   ECHOCARDIOGRAM COMPLETE  Result Date: 01/08/2021    ECHOCARDIOGRAM REPORT   Patient Name:   Robert [name] Date of Exam: 01/08/2021 Medical Rec #:  [id number]      Height:       74.0 in Accession #:    [id number]     Weight:       244.0 lb Date of  Birth:  [birth year]      BSA:          2.365 m Patient Age:    75 years       BP:           152/82 mmHg Patient Gender: M              HR:           77 bpm. Exam Location:  Inpatient Procedure: 2D Echo Indications:    Pre-operative Cardiocascular examination Z01.810  History:        Patient has no prior history of Echocardiogram examinations.                 Risk Factors:Hypertension and Dyslipidemia.  Sonographer:    Mikki Santee RDCS (AE) Referring Phys: [id number] [place]  1. Left ventricular ejection  fraction, by estimation, is 55 to 60%. The left ventricle has normal function. The left ventricle has no regional wall motion abnormalities. There is moderate concentric left ventricular hypertrophy. Left ventricular diastolic parameters are consistent with Grade I diastolic dysfunction (impaired relaxation).  2. Right ventricular systolic function is normal. The right ventricular size is normal.  3. The mitral valve is grossly normal. Trivial mitral valve regurgitation. No evidence of mitral stenosis.  4. The aortic valve is tricuspid. Aortic valve regurgitation is not visualized. No aortic stenosis is present. FINDINGS  Left Ventricle: Left ventricular ejection fraction, by estimation, is 55 to 60%. The left ventricle has normal function. The left ventricle has no regional wall motion abnormalities. The left ventricular internal cavity size was normal in size. There is  moderate concentric left ventricular hypertrophy. Left ventricular diastolic parameters are consistent with Grade I diastolic dysfunction (impaired relaxation). Right Ventricle: The right ventricular size is normal. No increase in right ventricular wall thickness. Right ventricular systolic function is normal. Left Atrium: Left atrial size was normal in size. Right Atrium: Right atrial size was normal in size. Pericardium: Trivial pericardial effusion is present. Mitral Valve: The mitral valve is grossly normal. Trivial mitral valve regurgitation. No evidence of mitral valve stenosis. Tricuspid Valve: The tricuspid valve is grossly normal. Tricuspid valve regurgitation is trivial. No evidence of tricuspid stenosis. Aortic Valve: The aortic valve is tricuspid. Aortic valve regurgitation is not visualized. No aortic stenosis is present. Pulmonic Valve: The pulmonic valve was grossly normal. Pulmonic valve regurgitation is mild. No evidence of pulmonic stenosis. Aorta: The aortic root and ascending aorta are structurally normal, with no evidence of  dilitation. Venous: The inferior vena cava was not well visualized. IAS/Shunts: The atrial septum is grossly normal.  LEFT VENTRICLE PLAX 2D LVIDd:         4.50 cm  Diastology LVIDs:         3.10 cm  LV e' medial:    5.11 cm/s LV PW:         1.30 cm  LV E/e' medial:  11.1 LV IVS:        1.40 cm  LV e' lateral:   6.75 cm/s LVOT diam:     2.30 cm  LV E/e' lateral: 8.4 LV SV:         55 LV SV Index:   23 LVOT Area:     4.15 cm  RIGHT VENTRICLE RV S prime:     10.10 cm/s TAPSE (M-mode): 1.0 cm LEFT ATRIUM             Index       RIGHT ATRIUM  Index LA diam:        3.40 cm 1.44 cm/m  RA Area:     12.20 cm LA Vol (A2C):   53.2 ml 22.49 ml/m RA Volume:   21.40 ml  9.05 ml/m LA Vol (A4C):   44.3 ml 18.73 ml/m LA Biplane Vol: 51.9 ml 21.94 ml/m  AORTIC VALVE LVOT Vmax:   72.40 cm/s LVOT Vmean:  48.100 cm/s LVOT VTI:    0.132 m  AORTA Ao Root diam: 3.80 cm MITRAL VALVE               TRICUSPID VALVE MV Area (PHT): 2.60 cm    TR Peak grad:   22.3 mmHg MV Decel Time: 292 msec    TR Vmax:        236.00 cm/s MV E velocity: 56.80 cm/s MV A velocity: 78.10 cm/s  SHUNTS MV E/A ratio:  0.73        Systemic VTI:  0.13 m                            Systemic Diam: 2.30 cm Eleonore Chiquito MD Electronically signed by Eleonore Chiquito MD Signature Date/Time: 01/08/2021/4:16:21 PM    Final     Anti-infectives: Anti-infectives (From admission, onward)   Start     Dose/Rate Route Frequency Ordered Stop   01/09/21 1030  cefTRIAXone (ROCEPHIN) 1 g in dextrose 5 % 50 mL IVPB  Status:  Discontinued        1 g 120 mL/hr over 30 Minutes Intravenous  Once 01/09/21 0932 01/09/21 0952   01/09/21 1030  cefTRIAXone (ROCEPHIN) 2 g in sodium chloride 0.9 % 100 mL IVPB        2 g 200 mL/hr over 30 Minutes Intravenous  Once 01/09/21 0952 01/09/21 1106   01/09/21 0934  sodium chloride 0.9 % with cefTRIAXone (ROCEPHIN) ADS Med       Note to Pharmacy: Charmayne Sheer   : cabinet override      01/09/21 0934 01/09/21 1051   01/05/21 2000   cefTRIAXone (ROCEPHIN) 1 g in sodium chloride 0.9 % 100 mL IVPB        1 g 200 mL/hr over 30 Minutes Intravenous Every 24 hours 01/05/21 0429     01/04/21 1900  cefTRIAXone (ROCEPHIN) 1 g in sodium chloride 0.9 % 100 mL IVPB        1 g 200 mL/hr over 30 Minutes Intravenous  Once 01/04/21 1857 01/04/21 2022       Assessment/Plan Hx CVA with left hemiparesis(brace LLE) Parkinson'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 Johney Maine - POD#1 - hgb stable - ok to resume eliquis and trend CBC - start CLD and advance as tolerated to soft diet - mobilize today  - stable for discharge from a surgical standpoint when tolerating soft diet and pain controlled with PO meds. Will ensure follow up is in AVS  FEN: soft diet ID: Rocephin 3/12>> DVT: ok to resume eliquis Follow-up: TBD  LOS: 5 days    Norm Parcel , [facility] Surgery 01/10/2021, 9:17 AM Please see Amion for pager number during day hours 7:00am-4:30pm

## 2021-01-10 NOTE — Progress Notes (Signed)
[facility]  PROGRESS NOTE  Robert [name] LSL:[id number] DOB: Oct 28, 1945 DOA: 01/04/2021 PCP: [name], MD   Brief HPI:   75 year old male with history of CVA with residual left-sided hemiparesis, GERD, DVT on Eliquis, hypertension, hyperlipidemia, chronic indwelling Foley catheter changed on 01/05/2021, Parkinson disease came to ED with complaints of vomiting.  In the ED was found to have acute pancreatitis and abnormal UA.  CT also showed numerous gallstones with no gallbladder, mildly distended, no pericholecystic inflammatory changes.  Patient's abdominal pain resolved, pancreatic enzymes normalized.  Surgery was consulted for possible gallstone pancreatitis.  Plan for lap chole on Thursday pending cardiology clearance.   Subjective   Patient seen and examined, denies any complaints.  Started on clear liquid diet this morning.   Assessment/Plan:     1. Gallstone pancreatitis-liver enzymes have improved, lipase is down to 36.  Ultrasound abdomen showed multiple gallstones in the gallbladder lumen, no pericholecystic fluid.  General surgery consulted, patient underwent laparoscopic cholecystectomy.  Continue morphine as needed for pain 2. Asymptomatic bacteriuria versus UTI-patient has chronic indwelling Foley catheter, abnormal UA.  Lactic acid was 1.0 on admission.  Urine culture was not obtained at the time of admission.  He has been on antibiotics since 01/04/2021.  WBC is down to 8.3.  Completed 7 days of treatment with IV antibiotics.  Will discontinue ceftriaxone. 3. History of CVA with left hemiparesis-PT evaluation done, recommend skilled nursing facility on discharge. 4. Hypertension-blood pressure is stable, continue losartan 5. History of Parkinson disease-continue carbidopa levodopa, rasagiline. 6. History of DVT-s/p IVC filter, Eliquis for anticoagulation 7. Chronic indwelling Foley catheter-has been leaking around Foley catheter which was replaced on 01/22/2009,  leaking resolved after changing the catheter.     COVID-19 Labs  No results for input(s): DDIMER, FERRITIN, LDH, CRP in the last 72 hours.  Lab Results  Component Value Date   SARSCOV2NAA NEGATIVE 01/04/2021   [place] NEGATIVE 11/27/2020   [place] NEGATIVE 11/09/2020   SARSCOV2NAA NOT DETECTED 08/18/2019     Scheduled medications:   . apixaban  5 mg Oral BID  . busPIRone  7.5 mg Oral BID  . Carbidopa-Levodopa ER  1 tablet Oral QHS  . Carbidopa-Levodopa ER  2 tablet Oral BID  . Chlorhexidine Gluconate Cloth  6 each Topical Daily  . losartan  50 mg Oral Daily  . pantoprazole  40 mg Oral Daily  . Pimavanserin Tartrate  34 mg Oral Daily  . polyethylene glycol  17 g Oral BID  . rasagiline  1 mg Oral Daily  . simvastatin  20 mg Oral QPM  . tamsulosin  0.4 mg Oral Daily         CBG: Recent Labs  Lab 01/04/21 1710  GLUCAP 179*    SpO2: 96 % O2 Flow Rate (L/min): 2 L/min    CBC: Recent Labs  Lab 01/04/21 1710 01/05/21 0454 01/06/21 0336 01/07/21 0340 01/09/21 0350 01/10/21 0401  WBC 13.3* 17.9* 13.7* 8.3 6.9 13.8*  NEUTROABS 10.8*  --  10.5*  --   --   --   HGB 14.9 14.6 13.2 12.8* 13.4 13.5  HCT 46.2 45.0 41.3 40.1 41.9 41.4  MCV 91.8 90.4 92.6 92.4 92.1 90.2  PLT 270 280 258 258 322 115    Basic Metabolic Panel: Recent Labs  Lab 01/04/21 1710 01/05/21 0454 01/06/21 0336 01/07/21 0340 01/10/21 0401  NA 133* 133* 133* 138 139  K 3.9 4.0 3.7 3.7 4.1  CL 99 101 101 106 107  CO2 21*  24 23 24 23   GLUCOSE 196* 159* 150* 159* 212*  BUN 8 8 8 8 10   CREATININE 0.93 0.91 0.79 0.90 0.89  CALCIUM 8.9 8.7* 8.5* 8.5* 9.1  MG  --   --  1.9  --   --      Liver Function Tests: Recent Labs  Lab 01/04/21 1710 01/05/21 0454 01/07/21 0340 01/10/21 0401  AST 138* 78* 25 76*  ALT 68* 42 30 48*  ALKPHOS 131* 118 82 77  BILITOT 2.9* 2.2* 1.2 0.9  PROT 7.1 6.8 6.1* 6.6  ALBUMIN 3.5 3.3* 2.8* 3.1*     Antibiotics: Anti-infectives (From  admission, onward)   Start     Dose/Rate Route Frequency Ordered Stop   01/09/21 1030  cefTRIAXone (ROCEPHIN) 1 g in dextrose 5 % 50 mL IVPB  Status:  Discontinued        1 g 120 mL/hr over 30 Minutes Intravenous  Once 01/09/21 0932 01/09/21 0952   01/09/21 1030  cefTRIAXone (ROCEPHIN) 2 g in sodium chloride 0.9 % 100 mL IVPB        2 g 200 mL/hr over 30 Minutes Intravenous  Once 01/09/21 0952 01/09/21 1106   01/09/21 0934  sodium chloride 0.9 % with cefTRIAXone (ROCEPHIN) ADS Med       Note to Pharmacy: Charmayne Sheer   : cabinet override      01/09/21 0934 01/09/21 1051   01/05/21 2000  cefTRIAXone (ROCEPHIN) 1 g in sodium chloride 0.9 % 100 mL IVPB  Status:  Discontinued        1 g 200 mL/hr over 30 Minutes Intravenous Every 24 hours 01/05/21 0429 01/10/21 1104   01/04/21 1900  cefTRIAXone (ROCEPHIN) 1 g in sodium chloride 0.9 % 100 mL IVPB        1 g 200 mL/hr over 30 Minutes Intravenous  Once 01/04/21 1857 01/04/21 2022       DVT prophylaxis: SCDs  Code Status: Full code  Family Communication: No family at bedside   Consultants:  General surgery   Cardiology  Neurology  Procedures:  Echocardiogram    Objective   Vitals:   01/09/21 1355 01/09/21 2002 01/10/21 0601 01/10/21 1343  BP: (!) 146/86 (!) 168/97 (!) 156/88 (!) 144/85  Pulse: (!) 109 (!) 103 91 79  Resp: 16 18 18 16   Temp: 98.1 F (36.7 C) 97.9 F (36.6 C) 98.1 F (36.7 C) 98.2 F (36.8 C)  TempSrc:  Oral Oral   SpO2: 98% 96% 96%   Weight:      Height:        Intake/Output Summary (Last 24 hours) at 01/10/2021 1416 Last data filed at 01/10/2021 1333 Gross per 24 hour  Intake 236 ml  Output 1100 ml  Net -864 ml    03/16 1901 - 03/18 0700 In: 2289.5 [I.V.:1989.5] Out: 4008 [QPYPP:5093]  Filed Weights   01/04/21 1708  Weight: 110.7 kg    Physical Examination:   General-appears in no acute distress Heart-S1-S2, regular, no murmur auscultated Lungs-clear to auscultation  bilaterally, no wheezing or crackles auscultated Abdomen-soft, nontender, no organomegaly Extremities-no edema in the lower extremities Neuro-alert, oriented x3, no focal deficit noted  Status is: Inpatient  Dispo: The patient is from: Home              Anticipated d/c is to: Home              Anticipated d/c date is: 01/11/2021  Patient medically stable for discharge  Barrier to discharge-awaiting bed at skilled nursing facility  Pressure Injury 01/05/21 Right Stage 2 -  Partial thickness loss of dermis presenting as a shallow open injury with a red, pink wound bed without slough. (Active)  01/05/21 0500  Location:   Location Orientation: Right  Staging: Stage 2 -  Partial thickness loss of dermis presenting as a shallow open injury with a red, pink wound bed without slough.  Wound Description (Comments):   Present on Admission: Yes           Data Reviewed:   Recent Results (from the past 240 hour(s))  SARS CORONAVIRUS 2 (TAT 6-24 HRS) Nasopharyngeal Nasopharyngeal Swab     Status: None   Collection Time: 01/04/21  7:32 PM   Specimen: Nasopharyngeal Swab  Result Value Ref Range Status   SARS Coronavirus 2 NEGATIVE NEGATIVE Final    Comment: (NOTE) SARS-CoV-2 target nucleic acids are NOT DETECTED.  The SARS-CoV-2 RNA is generally detectable in upper and lower respiratory specimens during the acute phase of infection. Negative results do not preclude SARS-CoV-2 infection, do not rule out co-infections with other pathogens, and should not be used as the sole basis for treatment or other patient management decisions. Negative results must be combined with clinical observations, patient history, and epidemiological information. The expected result is Negative.  Fact Sheet for Patients: SugarRoll.be  Fact Sheet for Healthcare Providers: https://www.woods-mathews.com/  This test is not yet approved or cleared by the  [place] FDA and  has been authorized for detection and/or diagnosis of SARS-CoV-2 by FDA under an Emergency Use Authorization (EUA). This EUA will remain  in effect (meaning this test can be used) for the duration of the COVID-19 declaration under Se ction 564(b)(1) of the Act, 21 U.S.C. section 360bbb-3(b)(1), unless the authorization is terminated or revoked sooner.  Performed at [facility], [place] [address] [place], [place] [address]     Recent Labs  Lab 01/04/21 1710 01/05/21 0857 01/06/21 0336  LIPASE 279* 63* 36   No results for input(s): AMMONIA in the last 168 hours.  Cardiac Enzymes: No results for input(s): CKTOTAL, CKMB, CKMBINDEX, TROPONINI in the last 168 hours. BNP (last 3 results) Recent Labs    01/04/21 1711  BNP 111.1*    ProBNP (last 3 results) No results for input(s): PROBNP in the last 8760 hours.  Studies:  DG Cholangiogram Operative  Result Date: 01/09/2021 CLINICAL DATA:  75 year old male with cholelithiasis EXAM: INTRAOPERATIVE CHOLANGIOGRAM TECHNIQUE: Cholangiographic images from the C-arm fluoroscopic device were submitted for interpretation post-operatively. Please see the procedural report for the amount of contrast and the fluoroscopy time utilized. COMPARISON:  Ultrasound 01/05/2021 FINDINGS: Surgical instruments project over the upper abdomen. There is cannulation of the cystic duct/gallbladder neck, with antegrade infusion of contrast. Caliber of the extrahepatic ductal system within normal limits. No definite filling defect within the extrahepatic ducts identified. Free flow of contrast across the ampulla. IMPRESSION: Intraoperative cholangiogram demonstrates extrahepatic biliary ducts of unremarkable caliber, with no definite filling defects identified. Free flow of contrast across the ampulla. Please refer to the dictated operative report for full details of intraoperative findings and procedure Electronically Signed   By: Corrie Mckusick D.O.   On: 01/09/2021 12:00   ECHOCARDIOGRAM COMPLETE  Result Date: 01/08/2021    ECHOCARDIOGRAM REPORT   Patient Name:   Robert [name] Date of Exam: 01/08/2021 Medical Rec #:  [id number]      Height:  74.0 in Accession #:    [id number]     Weight:       244.0 lb Date of Birth:  26-Mar-1946      BSA:          2.365 m Patient Age:    39 years       BP:           152/82 mmHg Patient Gender: M              HR:           77 bpm. Exam Location:  Inpatient Procedure: 2D Echo Indications:    Pre-operative Cardiocascular examination Z01.810  History:        Patient has no prior history of Echocardiogram examinations.                 Risk Factors:Hypertension and Dyslipidemia.  Sonographer:    Mikki Santee RDCS (AE) Referring Phys: [id number] [place]  1. Left ventricular ejection fraction, by estimation, is 55 to 60%. The left ventricle has normal function. The left ventricle has no regional wall motion abnormalities. There is moderate concentric left ventricular hypertrophy. Left ventricular diastolic parameters are consistent with Grade I diastolic dysfunction (impaired relaxation).  2. Right ventricular systolic function is normal. The right ventricular size is normal.  3. The mitral valve is grossly normal. Trivial mitral valve regurgitation. No evidence of mitral stenosis.  4. The aortic valve is tricuspid. Aortic valve regurgitation is not visualized. No aortic stenosis is present. FINDINGS  Left Ventricle: Left ventricular ejection fraction, by estimation, is 55 to 60%. The left ventricle has normal function. The left ventricle has no regional wall motion abnormalities. The left ventricular internal cavity size was normal in size. There is  moderate concentric left ventricular hypertrophy. Left ventricular diastolic parameters are consistent with Grade I diastolic dysfunction (impaired relaxation). Right Ventricle: The right ventricular size is normal. No increase in right  ventricular wall thickness. Right ventricular systolic function is normal. Left Atrium: Left atrial size was normal in size. Right Atrium: Right atrial size was normal in size. Pericardium: Trivial pericardial effusion is present. Mitral Valve: The mitral valve is grossly normal. Trivial mitral valve regurgitation. No evidence of mitral valve stenosis. Tricuspid Valve: The tricuspid valve is grossly normal. Tricuspid valve regurgitation is trivial. No evidence of tricuspid stenosis. Aortic Valve: The aortic valve is tricuspid. Aortic valve regurgitation is not visualized. No aortic stenosis is present. Pulmonic Valve: The pulmonic valve was grossly normal. Pulmonic valve regurgitation is mild. No evidence of pulmonic stenosis. Aorta: The aortic root and ascending aorta are structurally normal, with no evidence of dilitation. Venous: The inferior vena cava was not well visualized. IAS/Shunts: The atrial septum is grossly normal.  LEFT VENTRICLE PLAX 2D LVIDd:         4.50 cm  Diastology LVIDs:         3.10 cm  LV e' medial:    5.11 cm/s LV PW:         1.30 cm  LV E/e' medial:  11.1 LV IVS:        1.40 cm  LV e' lateral:   6.75 cm/s LVOT diam:     2.30 cm  LV E/e' lateral: 8.4 LV SV:         55 LV SV Index:   23 LVOT Area:     4.15 cm  RIGHT VENTRICLE RV S prime:     10.10 cm/s TAPSE (M-mode): 1.0 cm LEFT  ATRIUM             Index       RIGHT ATRIUM           Index LA diam:        3.40 cm 1.44 cm/m  RA Area:     12.20 cm LA Vol (A2C):   53.2 ml 22.49 ml/m RA Volume:   21.40 ml  9.05 ml/m LA Vol (A4C):   44.3 ml 18.73 ml/m LA Biplane Vol: 51.9 ml 21.94 ml/m  AORTIC VALVE LVOT Vmax:   72.40 cm/s LVOT Vmean:  48.100 cm/s LVOT VTI:    0.132 m  AORTA Ao Root diam: 3.80 cm MITRAL VALVE               TRICUSPID VALVE MV Area (PHT): 2.60 cm    TR Peak grad:   22.3 mmHg MV Decel Time: 292 msec    TR Vmax:        236.00 cm/s MV E velocity: 56.80 cm/s MV A velocity: 78.10 cm/s  SHUNTS MV E/A ratio:  0.73        Systemic  VTI:  0.13 m                            Systemic Diam: 2.30 cm Eleonore Chiquito MD Electronically signed by Eleonore Chiquito MD Signature Date/Time: 01/08/2021/4:16:21 PM    Final        Oswald Hillock   [facility] If 7PM-7AM, please contact night-coverage at [email or web address], Office  [phone]   01/10/2021, 2:16 PM  LOS: 5 days

## 2021-01-11 DIAGNOSIS — Z419 Encounter for procedure for purposes other than remedying health state, unspecified: Secondary | ICD-10-CM

## 2021-01-11 MED ORDER — ZINC OXIDE 40 % EX OINT
TOPICAL_OINTMENT | Freq: Every day | CUTANEOUS | Status: DC | PRN
  Filled 2021-01-11: qty 57

## 2021-01-11 NOTE — Progress Notes (Addendum)
[facility]  PROGRESS NOTE  Robert [name] YKZ:[id number] DOB: Jul 20, 1946 DOA: 01/04/2021 PCP: [name], MD   Brief HPI:   75 year old male with history of CVA with residual left-sided hemiparesis, GERD, DVT on Eliquis, hypertension, hyperlipidemia, chronic indwelling Foley catheter changed on 01/05/2021, Parkinson disease came to ED with complaints of vomiting.  In the ED was found to have acute pancreatitis and abnormal UA.  CT also showed numerous gallstones with no gallbladder, mildly distended, no pericholecystic inflammatory changes.  Patient's abdominal pain resolved, pancreatic enzymes normalized.  Surgery was consulted for possible gallstone pancreatitis.  Plan for lap chole on Thursday pending cardiology clearance.   Subjective   Patient seen and examined, no new complaints.  Wife upset with nursing staff that he has not been cleaned properly in the buttocks which caused rash.   Assessment/Plan:     1. Gallstone pancreatitis-liver enzymes have improved, lipase is down to 36.  Ultrasound abdomen showed multiple gallstones in the gallbladder lumen, no pericholecystic fluid.  General surgery consulted, patient underwent laparoscopic cholecystectomy.  Continue morphine as needed for pain 2. Asymptomatic bacteriuria versus UTI-patient has chronic indwelling Foley catheter, abnormal UA.  Lactic acid was 1.0 on admission.  Urine culture was not obtained at the time of admission.  He has been on antibiotics since 01/04/2021.  WBC is down to 8.3.  Completed 7 days of treatment with IV antibiotics.  Will discontinue ceftriaxone. 3. History of CVA with left hemiparesis-PT evaluation done, recommend skilled nursing facility on discharge. 4. Hypertension-blood pressure is stable, continue losartan 5. History of Parkinson disease-continue carbidopa levodopa, rasagiline. 6. Skin rash-noted on buttocks and scrotum.  Will order Desitin cream daily. 7. History of DVT-s/p IVC filter, Eliquis  for anticoagulation 8. Chronic indwelling Foley catheter-has been leaking around Foley catheter which was replaced on 01/22/2009, leaking resolved after changing the catheter.        Scheduled medications:    apixaban  5 mg Oral BID   busPIRone  7.5 mg Oral BID   Carbidopa-Levodopa ER  1 tablet Oral QHS   Carbidopa-Levodopa ER  2 tablet Oral BID   Chlorhexidine Gluconate Cloth  6 each Topical Daily   losartan  50 mg Oral Daily   pantoprazole  40 mg Oral Daily   Pimavanserin Tartrate  34 mg Oral Daily   polyethylene glycol  17 g Oral BID   rasagiline  1 mg Oral Daily   simvastatin  20 mg Oral QPM   tamsulosin  0.4 mg Oral Daily     Body mass index is 31.33 kg/m.    CBG: Recent Labs  Lab 01/04/21 1710  GLUCAP 179*    SpO2: 98 % O2 Flow Rate (L/min): 2 L/min    CBC: Recent Labs  Lab 01/04/21 1710 01/05/21 0454 01/06/21 0336 01/07/21 0340 01/09/21 0350 01/10/21 0401  WBC 13.3* 17.9* 13.7* 8.3 6.9 13.8*  NEUTROABS 10.8*  --  10.5*  --   --   --   HGB 14.9 14.6 13.2 12.8* 13.4 13.5  HCT 46.2 45.0 41.3 40.1 41.9 41.4  MCV 91.8 90.4 92.6 92.4 92.1 90.2  PLT 270 280 258 258 322 939    Basic Metabolic Panel: Recent Labs  Lab 01/04/21 1710 01/05/21 0454 01/06/21 0336 01/07/21 0340 01/10/21 0401  NA 133* 133* 133* 138 139  K 3.9 4.0 3.7 3.7 4.1  CL 99 101 101 106 107  CO2 21* 24 23 24 23   GLUCOSE 196* 159* 150* 159* 212*  BUN 8 8 8  8 10  CREATININE 0.93 0.91 0.79 0.90 0.89  CALCIUM 8.9 8.7* 8.5* 8.5* 9.1  MG  --   --  1.9  --   --      Liver Function Tests: Recent Labs  Lab 01/04/21 1710 01/05/21 0454 01/07/21 0340 01/10/21 0401  AST 138* 78* 25 76*  ALT 68* 42 30 48*  ALKPHOS 131* 118 82 77  BILITOT 2.9* 2.2* 1.2 0.9  PROT 7.1 6.8 6.1* 6.6  ALBUMIN 3.5 3.3* 2.8* 3.1*     Antibiotics: Anti-infectives (From admission, onward)   Start     Dose/Rate Route Frequency Ordered Stop   01/09/21 1030  cefTRIAXone (ROCEPHIN) 1 g in  dextrose 5 % 50 mL IVPB  Status:  Discontinued        1 g 120 mL/hr over 30 Minutes Intravenous  Once 01/09/21 0932 01/09/21 0952   01/09/21 1030  cefTRIAXone (ROCEPHIN) 2 g in sodium chloride 0.9 % 100 mL IVPB        2 g 200 mL/hr over 30 Minutes Intravenous  Once 01/09/21 0952 01/09/21 1106   01/09/21 0934  sodium chloride 0.9 % with cefTRIAXone (ROCEPHIN) ADS Med       Note to Pharmacy: Charmayne Sheer   : cabinet override      01/09/21 0934 01/09/21 1051   01/05/21 2000  cefTRIAXone (ROCEPHIN) 1 g in sodium chloride 0.9 % 100 mL IVPB  Status:  Discontinued        1 g 200 mL/hr over 30 Minutes Intravenous Every 24 hours 01/05/21 0429 01/10/21 1104   01/04/21 1900  cefTRIAXone (ROCEPHIN) 1 g in sodium chloride 0.9 % 100 mL IVPB        1 g 200 mL/hr over 30 Minutes Intravenous  Once 01/04/21 1857 01/04/21 2022       DVT prophylaxis: SCDs  Code Status: Full code  Family Communication: No family at bedside   Consultants:  General surgery   Cardiology  Neurology  Procedures:  Echocardiogram    Objective   Vitals:   01/10/21 1343 01/10/21 2125 01/11/21 0442 01/11/21 1338  BP: (!) 144/85 (!) 143/83 (!) 146/85 139/78  Pulse: 79 80 89 91  Resp: 16 18 18 16   Temp: 98.2 F (36.8 C) 98.4 F (36.9 C) 99.2 F (37.3 C) 99.5 F (37.5 C)  TempSrc:  Oral  Oral  SpO2:  93% 95% 98%  Weight:      Height:        Intake/Output Summary (Last 24 hours) at 01/11/2021 1422 Last data filed at 01/11/2021 0900 Gross per 24 hour  Intake 475 ml  Output 1650 ml  Net -1175 ml    03/17 1901 - 03/19 0700 In: 591 [P.O.:591] Out: 2750 [Urine:2750]  Filed Weights   01/04/21 1708  Weight: 110.7 kg    Physical Examination:   General-appears in no acute distress Heart-S1-S2, regular, no murmur auscultated Lungs-clear to auscultation bilaterally, no wheezing or crackles auscultated Abdomen-soft, nontender, no organomegaly Extremities-no edema in the lower  extremities Neuro-alert, oriented x3, no focal deficit noted  Status is: Inpatient  Dispo: The patient is from: Home              Anticipated d/c is to: Home              Anticipated d/c date is: 01/13/2021              Patient medically stable for discharge  Barrier to discharge-awaiting bed at skilled nursing facility  Pressure Injury 01/05/21 Right Stage 2 -  Partial thickness loss of dermis presenting as a shallow open injury with a red, pink wound bed without slough. (Active)  01/05/21 0500  Location:   Location Orientation: Right  Staging: Stage 2 -  Partial thickness loss of dermis presenting as a shallow open injury with a red, pink wound bed without slough.  Wound Description (Comments):   Present on Admission: Yes      Data Reviewed:   Recent Results (from the past 240 hour(s))  SARS CORONAVIRUS 2 (TAT 6-24 HRS) Nasopharyngeal Nasopharyngeal Swab     Status: None   Collection Time: 01/04/21  7:32 PM   Specimen: Nasopharyngeal Swab  Result Value Ref Range Status   SARS Coronavirus 2 NEGATIVE NEGATIVE Final    Comment: (NOTE) SARS-CoV-2 target nucleic acids are NOT DETECTED.       Recent Labs  Lab 01/04/21 1710 01/05/21 0857 01/06/21 0336  LIPASE 279* 63* 36   No results for input(s): AMMONIA in the last 168 hours.  Cardiac Enzymes: No results for input(s): CKTOTAL, CKMB, CKMBINDEX, TROPONINI in the last 168 hours. BNP (last 3 results) Recent Labs    01/04/21 1711  BNP 111.1*    ProBNP (last 3 results) No results for input(s): PROBNP in the last 8760 hours.  Studies:  No results found.     Oswald Hillock   [facility] If 7PM-7AM, please contact night-coverage at [email or web address], Office  [phone]   01/11/2021, 2:22 PM  LOS: 6 days

## 2021-01-11 NOTE — TOC Progression Note (Signed)
Transition of Care [facility]) - Progression Note    Patient Details  Name: Rogue Rafalski MRN: [id number] Date of Birth: February 04, 1946  [facility]) CM/SW Contact  Ross Ludwig, [place] Phone Number: 01/11/2021, 6:55 PM  Clinical Narrative:     Patient has been faxed out, waiting for bed offers for SNF placement.  CSW to continue to follow patient's progress throughout discharge planning.  Expected Discharge Plan: [place] Barriers to Discharge: Continued Medical Work up  Expected Discharge Plan and Services Expected Discharge Plan: [place] Choice: [place] arrangements for the past 2 months: Single Family Home                                       Social Determinants of Health (SDOH) Interventions    Readmission Risk Interventions Readmission Risk Prevention Plan 08/16/2019  Post Dischage Appt Not Complete  Appt Comments disposition tbd  Medication Screening Complete  Transportation Screening Complete  Some recent data might be hidden

## 2021-01-11 NOTE — Progress Notes (Signed)
2 Days Post-Op   Subjective/Chief Complaint: No pain, tol diet, wife [name] who I know from [facility] is there   Objective: Vital signs in last 24 hours: Temp:  [98.2 F (36.8 C)-99.2 F (37.3 C)] 99.2 F (37.3 C) (03/19 0442) Pulse Rate:  [79-89] 89 (03/19 0442) Resp:  [16-18] 18 (03/19 0442) BP: (143-146)/(83-85) 146/85 (03/19 0442) SpO2:  [93 %-95 %] 95 % (03/19 0442) Last BM Date: 01/10/21  Intake/Output from previous day: 03/18 0701 - 03/19 0700 In: 591 [P.O.:591] Out: 1650 [Urine:1650] Intake/Output this shift: Total I/O In: 120 [P.O.:120] Out: -   GI: soft nontender nondistended dressings clean  Lab Results:  Recent Labs    01/09/21 0350 01/10/21 0401  WBC 6.9 13.8*  HGB 13.4 13.5  HCT 41.9 41.4  PLT 322 387   BMET Recent Labs    01/10/21 0401  NA 139  K 4.1  CL 107  CO2 23  GLUCOSE 212*  BUN 10  CREATININE 0.89  CALCIUM 9.1   PT/INR No results for input(s): LABPROT, INR in the last 72 hours. ABG No results for input(s): PHART, HCO3 in the last 72 hours.  Invalid input(s): PCO2, PO2  Studies/Results: DG Cholangiogram Operative  Result Date: 01/09/2021 CLINICAL DATA:  75 year old male with cholelithiasis EXAM: INTRAOPERATIVE CHOLANGIOGRAM TECHNIQUE: Cholangiographic images from the C-arm fluoroscopic device were submitted for interpretation post-operatively. Please see the procedural report for the amount of contrast and the fluoroscopy time utilized. COMPARISON:  Ultrasound 01/05/2021 FINDINGS: Surgical instruments project over the upper abdomen. There is cannulation of the cystic duct/gallbladder neck, with antegrade infusion of contrast. Caliber of the extrahepatic ductal system within normal limits. No definite filling defect within the extrahepatic ducts identified. Free flow of contrast across the ampulla. IMPRESSION: Intraoperative cholangiogram demonstrates extrahepatic biliary ducts of unremarkable caliber, with no definite filling defects  identified. Free flow of contrast across the ampulla. Please refer to the dictated operative report for full details of intraoperative findings and procedure Electronically Signed   By: Corrie Mckusick D.O.   On: 01/09/2021 12:00    Anti-infectives: Anti-infectives (From admission, onward)   Start     Dose/Rate Route Frequency Ordered Stop   01/09/21 1030  cefTRIAXone (ROCEPHIN) 1 g in dextrose 5 % 50 mL IVPB  Status:  Discontinued        1 g 120 mL/hr over 30 Minutes Intravenous  Once 01/09/21 0932 01/09/21 0952   01/09/21 1030  cefTRIAXone (ROCEPHIN) 2 g in sodium chloride 0.9 % 100 mL IVPB        2 g 200 mL/hr over 30 Minutes Intravenous  Once 01/09/21 1950 01/09/21 1106   01/09/21 0934  sodium chloride 0.9 % with cefTRIAXone (ROCEPHIN) ADS Med       Note to Pharmacy: Charmayne Sheer   : cabinet override      01/09/21 0934 01/09/21 1051   01/05/21 2000  cefTRIAXone (ROCEPHIN) 1 g in sodium chloride 0.9 % 100 mL IVPB  Status:  Discontinued        1 g 200 mL/hr over 30 Minutes Intravenous Every 24 hours 01/05/21 0429 01/10/21 1104   01/04/21 1900  cefTRIAXone (ROCEPHIN) 1 g in sodium chloride 0.9 % 100 mL IVPB        1 g 200 mL/hr over 30 Minutes Intravenous  Once 01/04/21 1857 01/04/21 2022      Assessment/Plan: POD 2 S/P laparoscopic cholecystectomy w/ IOC 01/09/21 Dr. Johney Maine - hgb stable - ok to resume eliquis and trend CBC -  soft diet - mobilize today  - stable for discharge from a surgical standpoint when tolerating soft diet and pain controlled with PO meds. Will ensure follow up is in AVS. Likely needs snf IRW:ERXV diet ID: Rocephin 3/12>>done DVT: eliquis Follow-up: TBD  Rolm Bookbinder 01/11/2021

## 2021-01-11 NOTE — NC FL2 (Signed)
[place] LEVEL OF CARE SCREENING TOOL     IDENTIFICATION  Patient Name: Robert [name] Birthdate: 1946-08-12 Sex: male Admission Date (Current Location): 01/04/2021  [facility] and [place] Number:  Herbalist and Address:  [facility],  [place], [place]      Provider Number: [id number]  Attending Physician Name and Address:  Oswald Hillock, MD  Relative Name and Phone Number:  Che, Rachal [phone]  [phone]    Current Level of Care: Hospital Recommended Level of Care: [place] Prior Approval Number:    Date Approved/Denied:   PASRR Number: [id number]  Discharge Plan: SNF    Current Diagnoses: Patient Active Problem List   Diagnosis Date Noted  . Acute pancreatitis   . Palliative care by specialist   . Goals of care, counseling/discussion   . 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 Gallstone pancreatitis 01/07/2021  . Pressure injury of skin 01/05/2021  . Sepsis secondary to UTI ([place]) 11/10/2020  . AKI (acute kidney injury) ([place]) 11/10/2020  . Hyperkalemia 11/10/2020  . Parkinson's disease ([place]) 11/10/2020  . Hyperglycemia 11/10/2020  . Elevated LFTs 11/10/2020  . Diarrhea   . Other hydronephrosis   . History of deep vein thrombosis (DVT) of lower extremity   . Urinary retention 08/14/2019  . History of CVA with residual deficit   . BPH (benign prostatic hyperplasia)   . Orthostatic hypotension 08/13/2019  . Hyperlipidemia   . Hemiparesis affecting left side as late effect of cerebrovascular accident ([place]) 12/06/2017  . Gait abnormality 11/22/2017  . UTI (urinary tract infection) 04/22/2017  . Acute lower UTI 04/22/2017  . Tremor 04/22/2017  . Acute pyelonephritis 09/29/2012  . Nausea 09/29/2012  . Fever 09/29/2012  . HTN (hypertension) 09/29/2012  . H/O: CVA (cerebrovascular accident) 09/29/2012  . Hearing loss 09/29/2012    Orientation  RESPIRATION BLADDER Height & Weight     Self,Situation,Place  Normal Incontinent Weight: 244 lb (110.7 kg) Height:  6\' 2"  (188 cm)  BEHAVIORAL SYMPTOMS/MOOD NEUROLOGICAL BOWEL NUTRITION STATUS      Continent Diet (Soft diet)  AMBULATORY STATUS COMMUNICATION OF NEEDS Skin   Limited Assist Verbally PU Stage and Appropriate Care   PU Stage 2 Dressing: BID                   Personal Care Assistance Level of Assistance  Bathing,Feeding,Dressing Bathing Assistance: Limited assistance Feeding assistance: Limited assistance Dressing Assistance: Limited assistance     Functional Limitations Info  Sight,Hearing,Speech Sight Info: Adequate Hearing Info: Adequate Speech Info: Adequate    SPECIAL CARE FACTORS FREQUENCY  PT (By licensed PT),OT (By licensed OT)     PT Frequency: Minimum 5x a week OT Frequency: Minimum 5x a week            Contractures Contractures Info: Not present    Additional Factors Info  Code Status,Allergies,Psychotropic Code Status Info: Full Code Allergies Info: Propofol   Lisinopril Psychotropic Info: busPIRone (BUSPAR) tablet 7.5 mg         Current Medications (01/11/2021):  This is the current hospital active medication list Current Facility-Administered Medications  Medication Dose Route Frequency Provider Last Rate Last Admin  . 0.9 %  sodium chloride infusion   Intravenous Continuous Hugelmeyer, Alexis, DO 75 mL/hr at 01/11/21 0543 New Bag at 01/11/21 0543  . acetaminophen (TYLENOL) tablet 650 mg  650 mg Oral Q6H PRN Hugelmeyer, Alexis, DO   650 mg at 01/11/21  0835   Or  . acetaminophen (TYLENOL) suppository 650 mg  650 mg Rectal Q6H PRN Hugelmeyer, Alexis, DO      . albuterol (PROVENTIL) (2.5 MG/3ML) 0.083% nebulizer solution 2.5 mg  2.5 mg Nebulization Q6H PRN Hugelmeyer, Alexis, DO      . apixaban (ELIQUIS) tablet 5 mg  5 mg Oral BID Oswald Hillock, MD   5 mg at 01/11/21 0836  . bisacodyl (DULCOLAX) EC tablet 5 mg  5 mg Oral Daily PRN  Hugelmeyer, Alexis, DO      . bisacodyl (DULCOLAX) suppository 10 mg  10 mg Rectal Q12H PRN [name], MD      . busPIRone (BUSPAR) tablet 7.5 mg  7.5 mg Oral BID Hosie Poisson, MD   7.5 mg at 01/11/21 0836  . Carbidopa-Levodopa ER (SINEMET CR) 25-100 MG tablet controlled release 1 tablet  1 tablet Oral QHS Hosie Poisson, MD   1 tablet at 01/10/21 2214  . Carbidopa-Levodopa ER (SINEMET CR) 25-100 MG tablet controlled release 2 tablet  2 tablet Oral BID Hosie Poisson, MD   2 tablet at 01/11/21 0835  . Chlorhexidine Gluconate Cloth 2 % PADS 6 each  6 each Topical Daily Hosie Poisson, MD   6 each at 01/11/21 0837  . ipratropium (ATROVENT) nebulizer solution 0.5 mg  0.5 mg Nebulization Q6H PRN Hugelmeyer, Alexis, DO      . liver oil-zinc oxide (DESITIN) 40 % ointment   Topical Daily PRN Oswald Hillock, MD      . losartan (COZAAR) tablet 50 mg  50 mg Oral Daily Hugelmeyer, Alexis, DO   50 mg at 01/11/21 0836  . magnesium citrate solution 1 Bottle  1 Bottle Oral Once PRN Hugelmeyer, Alexis, DO      . morphine 2 MG/ML injection 1 mg  1 mg Intravenous Q6H PRN Hugelmeyer, Alexis, DO   1 mg at 01/09/21 1605  . ondansetron (ZOFRAN) tablet 4 mg  4 mg Oral Q6H PRN Hugelmeyer, Alexis, DO       Or  . ondansetron (ZOFRAN) injection 4 mg  4 mg Intravenous Q6H PRN Hugelmeyer, Alexis, DO      . pantoprazole (PROTONIX) EC tablet 40 mg  40 mg Oral Daily Hosie Poisson, MD   40 mg at 01/11/21 0835  . Pimavanserin Tartrate CAPS 34 mg  34 mg Oral Daily Hosie Poisson, MD   34 mg at 01/11/21 0843  . polyethylene glycol (MIRALAX / GLYCOLAX) packet 17 g  17 g Oral BID [name], MD   17 g at 01/11/21 0843  . polyethylene glycol (MIRALAX / GLYCOLAX) packet 17 g  17 g Oral Q12H PRN [name], MD      . rasagiline (AZILECT) tablet 1 mg  1 mg Oral Daily Hosie Poisson, MD   1 mg at 01/11/21 0836  . senna-docusate (Senokot-S) tablet 1 tablet  1 tablet Oral QHS PRN Hugelmeyer, Alexis, DO      . simethicone (MYLICON) 40  PY/0.9XI suspension 40 mg  40 mg Oral QID PRN [name], MD      . simvastatin (ZOCOR) tablet 20 mg  20 mg Oral QPM Hugelmeyer, Alexis, DO   20 mg at 01/10/21 1814  . tamsulosin (FLOMAX) capsule 0.4 mg  0.4 mg Oral Daily Hugelmeyer, Alexis, DO   0.4 mg at 01/11/21 0836  . traMADol (ULTRAM) tablet 50 mg  50 mg Oral Q6H PRN Hugelmeyer, Alexis, DO      . traZODone (DESYREL) tablet 25 mg  25 mg  Oral QHS PRN Hugelmeyer, Alexis, DO         Discharge Medications: Please see discharge summary for a list of discharge medications.  Relevant Imaging Results:  Relevant Lab Results:   Additional Information SSN [id number]  Ross Ludwig, LCSW

## 2021-01-12 NOTE — Progress Notes (Addendum)
415 pm Wife choice is [employer]. Message sent to Bernita Raisin coordinator with choice. Jonnie Finner RN CCM, [facility] TOC CM [phone] Archita Lomeli RN CCM, [facility] T[facility] CM [phone]  409 pm TOC CM spoke to pt and wife at bedside. And presented offers for SNF. Wife states she will review and give TOC CM with her choice. Pt has been vaccinated and boosted. Will have COVID test completed if not done in last 48 hours. Jonnie Finner RN CCM, [place] [facility] CM [phone]   Destination  Service Provider Request Status Selected Services Address Phone Fax Patient Preferred  [facility] Preferred SNF  Accepted N/A [address], [place] [address] [phone] --  [facility] AND [facility] SNF  Accepted N/A [address] [place] [address] [phone] --  [facility] [place] [address] [place] [address] [phone] --  [facility] AND [facility] Preferred SNF  Pending - Request Sent N/A [address] [place] [address] [phone] --  [facility] Preferred SNF  Pending - Request Sent N/A [address] [place] [address] [phone] --  [facility] Preferred SNF  Pending - Request Sent N/A [address], [place] [address] [phone] --  [facility] Preferred SNF  Pending - Request Sent N/A [address] [place] [address] [phone] --  [facility] [place] [facility]  Pending - Request Sent N/A [address] [place] [address] [phone] --  [facility] Preferred SNF  Pending - Request Sent N/A [address] [place] [address] [phone] --  [facility] AND [facility] Preferred SNF  Pending - Request Sent N/A [address], [place] [address] [phone] --  [facility] SNF  Pending - Request Sent N/A [address] [place] [address] [phone] --  [facility] SNF  Pending - Request Sent N/A [address] [place] [address] [phone] --  [facility] Preferred SNF  Pending - Request Sent N/A [address], [place] [address] [phone] --  [facility]  Pending - Request Sent N/A [address] [place] [address] [phone] --  [facility] [place] [facility]  Pending - Request Sent N/A [address], [place] [address] [place] SNF  Pending - Request Sent N/A [address], [place] [address] [phone] --  [facility]  Pending - Request Sent N/A [address] [place] [address] [phone] --  [facility]  Pending - Request [facility] N/A [address] [place] [address] [phone] --  [facility] Preferred SNF

## 2021-01-12 NOTE — Progress Notes (Signed)
[facility]  PROGRESS NOTE  Can Lucci DXI:[id number] DOB: [birth year] DOA: 01/04/2021 PCP: [name], MD   Brief HPI:   75 year old male with history of CVA with residual left-sided hemiparesis, GERD, DVT on Eliquis, hypertension, hyperlipidemia, chronic indwelling Foley catheter changed on 01/05/2021, Parkinson disease came to ED with complaints of vomiting.  In the ED was found to have acute pancreatitis and abnormal UA.  CT also showed numerous gallstones with no gallbladder, mildly distended, no pericholecystic inflammatory changes.  Patient's abdominal pain resolved, pancreatic enzymes normalized.  Surgery was consulted for possible gallstone pancreatitis.  Plan for lap chole on Thursday pending cardiology clearance.   Subjective   Patient seen and examined, denies any complaints.   Assessment/Plan:     1. Gallstone pancreatitis-liver enzymes have improved, lipase is down to 36.  Ultrasound abdomen showed multiple gallstones in the gallbladder lumen, no pericholecystic fluid.  General surgery consulted, patient underwent laparoscopic cholecystectomy.  Continue morphine as needed for pain 2. Asymptomatic bacteriuria versus UTI-patient has chronic indwelling Foley catheter, abnormal UA.  Lactic acid was 1.0 on admission.  Urine culture was not obtained at the time of admission.  He has been on antibiotics since 01/04/2021.  WBC is down to 8.3.  Completed 7 days of treatment with IV antibiotics.  Will discontinue ceftriaxone. 3. History of CVA with left hemiparesis-PT evaluation done, recommend skilled nursing facility on discharge. 4. Hypertension-blood pressure is stable, continue losartan 5. History of Parkinson disease-continue carbidopa levodopa, rasagiline. 6. Skin rash-noted on buttocks and scrotum.  Will order Desitin cream daily. 7. History of DVT-s/p IVC filter, Eliquis for anticoagulation 8. Chronic indwelling Foley catheter-has been leaking around Foley catheter  which was replaced on 01/22/2009, leaking resolved after changing the catheter.        Scheduled medications:   . apixaban  5 mg Oral BID  . busPIRone  7.5 mg Oral BID  . Carbidopa-Levodopa ER  1 tablet Oral QHS  . Carbidopa-Levodopa ER  2 tablet Oral BID  . Chlorhexidine Gluconate Cloth  6 each Topical Daily  . losartan  50 mg Oral Daily  . pantoprazole  40 mg Oral Daily  . Pimavanserin Tartrate  34 mg Oral Daily  . polyethylene glycol  17 g Oral BID  . rasagiline  1 mg Oral Daily  . simvastatin  20 mg Oral QPM  . tamsulosin  0.4 mg Oral Daily     Body mass index is 31.33 kg/m.    CBG: No results for input(s): GLUCAP in the last 168 hours.  SpO2: 97 % O2 Flow Rate (L/min): 2 L/min    CBC: Recent Labs  Lab 01/06/21 0336 01/07/21 0340 01/09/21 0350 01/10/21 0401  WBC 13.7* 8.3 6.9 13.8*  NEUTROABS 10.5*  --   --   --   HGB 13.2 12.8* 13.4 13.5  HCT 41.3 40.1 41.9 41.4  MCV 92.6 92.4 92.1 90.2  PLT 258 258 322 767    Basic Metabolic Panel: Recent Labs  Lab 01/06/21 0336 01/07/21 0340 01/10/21 0401  NA 133* 138 139  K 3.7 3.7 4.1  CL 101 106 107  CO2 23 24 23   GLUCOSE 150* 159* 212*  BUN 8 8 10   CREATININE 0.79 0.90 0.89  CALCIUM 8.5* 8.5* 9.1  MG 1.9  --   --      Liver Function Tests: Recent Labs  Lab 01/07/21 0340 01/10/21 0401  AST 25 76*  ALT 30 48*  ALKPHOS 82 77  BILITOT 1.2 0.9  PROT  6.1* 6.6  ALBUMIN 2.8* 3.1*     Antibiotics: Anti-infectives (From admission, onward)   Start     Dose/Rate Route Frequency Ordered Stop   01/09/21 1030  cefTRIAXone (ROCEPHIN) 1 g in dextrose 5 % 50 mL IVPB  Status:  Discontinued        1 g 120 mL/hr over 30 Minutes Intravenous  Once 01/09/21 0932 01/09/21 0952   01/09/21 1030  cefTRIAXone (ROCEPHIN) 2 g in sodium chloride 0.9 % 100 mL IVPB        2 g 200 mL/hr over 30 Minutes Intravenous  Once 01/09/21 0952 01/09/21 1106   01/09/21 0934  sodium chloride 0.9 % with cefTRIAXone (ROCEPHIN) ADS  Med       Note to Pharmacy: Charmayne Sheer   : cabinet override      01/09/21 0934 01/09/21 1051   01/05/21 2000  cefTRIAXone (ROCEPHIN) 1 g in sodium chloride 0.9 % 100 mL IVPB  Status:  Discontinued        1 g 200 mL/hr over 30 Minutes Intravenous Every 24 hours 01/05/21 0429 01/10/21 1104   01/04/21 1900  cefTRIAXone (ROCEPHIN) 1 g in sodium chloride 0.9 % 100 mL IVPB        1 g 200 mL/hr over 30 Minutes Intravenous  Once 01/04/21 1857 01/04/21 2022       DVT prophylaxis: SCDs  Code Status: Full code  Family Communication: Discussed with patient's wife at bedside  Consultants:  General surgery   Cardiology  Neurology  Procedures:  Echocardiogram    Objective   Vitals:   01/11/21 2053 01/12/21 0529 01/12/21 0628 01/12/21 1346  BP: 140/76 (!) 179/94 (!) 151/82 (!) 141/78  Pulse: 80 89 83 76  Resp: 16 20  16   Temp: 99.2 F (37.3 C) 99.2 F (37.3 C)  99.8 F (37.7 C)  TempSrc: Oral Oral  Oral  SpO2: 97% 97%  97%  Weight:      Height:        Intake/Output Summary (Last 24 hours) at 01/12/2021 1400 Last data filed at 01/12/2021 1300 Gross per 24 hour  Intake 240 ml  Output 2150 ml  Net -1910 ml    03/18 1901 - 03/20 0700 In: 475 [P.O.:475] Out: 3000 [Urine:3000]  Filed Weights   01/04/21 1708  Weight: 110.7 kg    Physical Examination:   General-appears in no acute distress Heart-S1-S2, regular, no murmur auscultated Lungs-clear to auscultation bilaterally, no wheezing or crackles auscultated Abdomen-soft, nontender, no organomegaly Extremities-no edema in the lower extremities Neuro-alert, oriented x3, no focal deficit noted  Status is: Inpatient  Dispo: The patient is from: Home              Anticipated d/c is to: Home              Anticipated d/c date is: 01/13/2021              Patient medically stable for discharge  Barrier to discharge-awaiting bed at skilled nursing facility  Pressure Injury 01/05/21 Right Stage 2 -  Partial  thickness loss of dermis presenting as a shallow open injury with a red, pink wound bed without slough. (Active)  01/05/21 0500  Location:   Location Orientation: Right  Staging: Stage 2 -  Partial thickness loss of dermis presenting as a shallow open injury with a red, pink wound bed without slough.  Wound Description (Comments):   Present on Admission: Yes      Data Reviewed:  Recent Results (from the past 240 hour(s))  SARS CORONAVIRUS 2 (TAT 6-24 HRS) Nasopharyngeal Nasopharyngeal Swab     Status: None   Collection Time: 01/04/21  7:32 PM   Specimen: Nasopharyngeal Swab  Result Value Ref Range Status   SARS Coronavirus 2 NEGATIVE NEGATIVE Final    Comment: (NOTE) SARS-CoV-2 target nucleic acids are NOT DETECTED.       Recent Labs  Lab 01/06/21 0336  LIPASE 36   No results for input(s): AMMONIA in the last 168 hours.  Cardiac Enzymes: No results for input(s): CKTOTAL, CKMB, CKMBINDEX, TROPONINI in the last 168 hours. BNP (last 3 results) Recent Labs    01/04/21 1711  BNP 111.1*    ProBNP (last 3 results) No results for input(s): PROBNP in the last 8760 hours.  Studies:  No results found.     Oswald Hillock   [facility] If 7PM-7AM, please contact night-coverage at [email or web address], Office  [phone]   01/12/2021, 2:00 PM  LOS: 7 days

## 2021-01-12 NOTE — Progress Notes (Signed)
3 Days Post-Op   Subjective/Chief Complaint: Doing great, tol diet, minimal pain   Objective: Vital signs in last 24 hours: Temp:  [99.2 F (37.3 C)-99.5 F (37.5 C)] 99.2 F (37.3 C) (03/20 0529) Pulse Rate:  [80-91] 83 (03/20 0628) Resp:  [16-20] 20 (03/20 0529) BP: (139-179)/(76-94) 151/82 (03/20 0628) SpO2:  [97 %-98 %] 97 % (03/20 0529) Last BM Date: 01/11/21  Intake/Output from previous day: 03/19 0701 - 03/20 0700 In: 120 [P.O.:120] Out: 1350 [Urine:1350] Intake/Output this shift: No intake/output data recorded.  GI: soft nontender dressings clean  Lab Results:  Recent Labs    01/10/21 0401  WBC 13.8*  HGB 13.5  HCT 41.4  PLT 387   BMET Recent Labs    01/10/21 0401  NA 139  K 4.1  CL 107  CO2 23  GLUCOSE 212*  BUN 10  CREATININE 0.89  CALCIUM 9.1   PT/INR No results for input(s): LABPROT, INR in the last 72 hours. ABG No results for input(s): PHART, HCO3 in the last 72 hours.  Invalid input(s): PCO2, PO2  Studies/Results: No results found.  Anti-infectives: Anti-infectives (From admission, onward)   Start     Dose/Rate Route Frequency Ordered Stop   01/09/21 1030  cefTRIAXone (ROCEPHIN) 1 g in dextrose 5 % 50 mL IVPB  Status:  Discontinued        1 g 120 mL/hr over 30 Minutes Intravenous  Once 01/09/21 0932 01/09/21 0952   01/09/21 1030  cefTRIAXone (ROCEPHIN) 2 g in sodium chloride 0.9 % 100 mL IVPB        2 g 200 mL/hr over 30 Minutes Intravenous  Once 01/09/21 9357 01/09/21 1106   01/09/21 0934  sodium chloride 0.9 % with cefTRIAXone (ROCEPHIN) ADS Med       Note to Pharmacy: Charmayne Sheer   : cabinet override      01/09/21 0934 01/09/21 1051   01/05/21 2000  cefTRIAXone (ROCEPHIN) 1 g in sodium chloride 0.9 % 100 mL IVPB  Status:  Discontinued        1 g 200 mL/hr over 30 Minutes Intravenous Every 24 hours 01/05/21 0429 01/10/21 1104   01/04/21 1900  cefTRIAXone (ROCEPHIN) 1 g in sodium chloride 0.9 % 100 mL IVPB        1 g 200  mL/hr over 30 Minutes Intravenous  Once 01/04/21 1857 01/04/21 2022      Assessment/Plan: POD 3 S/P laparoscopic cholecystectomy w/ IOC 01/09/21 Dr. Johney Maine - hgb stable - continue eliquis - soft diet - mobilize/pulm toilet - stable for discharge to SNF when available SVX:BLTJ diet ID: Rocephin 3/12>>done QZE:[id number] Follow-up: [facility] postop  Rolm Bookbinder 01/12/2021

## 2021-01-13 DIAGNOSIS — R0989 Other specified symptoms and signs involving the circulatory and respiratory systems: Secondary | ICD-10-CM | POA: Diagnosis not present

## 2021-01-13 DIAGNOSIS — D72829 Elevated white blood cell count, unspecified: Secondary | ICD-10-CM | POA: Diagnosis not present

## 2021-01-13 DIAGNOSIS — I6930 Unspecified sequelae of cerebral infarction: Secondary | ICD-10-CM | POA: Diagnosis not present

## 2021-01-13 DIAGNOSIS — R1312 Dysphagia, oropharyngeal phase: Secondary | ICD-10-CM | POA: Diagnosis not present

## 2021-01-13 DIAGNOSIS — N401 Enlarged prostate with lower urinary tract symptoms: Secondary | ICD-10-CM | POA: Diagnosis not present

## 2021-01-13 DIAGNOSIS — Z0189 Encounter for other specified special examinations: Secondary | ICD-10-CM | POA: Diagnosis not present

## 2021-01-13 DIAGNOSIS — T83018A Breakdown (mechanical) of other indwelling urethral catheter, initial encounter: Secondary | ICD-10-CM | POA: Diagnosis not present

## 2021-01-13 DIAGNOSIS — M2550 Pain in unspecified joint: Secondary | ICD-10-CM | POA: Diagnosis not present

## 2021-01-13 DIAGNOSIS — E119 Type 2 diabetes mellitus without complications: Secondary | ICD-10-CM | POA: Diagnosis not present

## 2021-01-13 DIAGNOSIS — L89152 Pressure ulcer of sacral region, stage 2: Secondary | ICD-10-CM | POA: Diagnosis not present

## 2021-01-13 DIAGNOSIS — D6859 Other primary thrombophilia: Secondary | ICD-10-CM | POA: Diagnosis not present

## 2021-01-13 DIAGNOSIS — K219 Gastro-esophageal reflux disease without esophagitis: Secondary | ICD-10-CM | POA: Diagnosis not present

## 2021-01-13 DIAGNOSIS — E782 Mixed hyperlipidemia: Secondary | ICD-10-CM | POA: Diagnosis not present

## 2021-01-13 DIAGNOSIS — A419 Sepsis, unspecified organism: Secondary | ICD-10-CM | POA: Diagnosis not present

## 2021-01-13 DIAGNOSIS — G20 Parkinson's disease: Secondary | ICD-10-CM | POA: Diagnosis not present

## 2021-01-13 DIAGNOSIS — N319 Neuromuscular dysfunction of bladder, unspecified: Secondary | ICD-10-CM | POA: Diagnosis not present

## 2021-01-13 DIAGNOSIS — N390 Urinary tract infection, site not specified: Secondary | ICD-10-CM | POA: Diagnosis not present

## 2021-01-13 DIAGNOSIS — Z86718 Personal history of other venous thrombosis and embolism: Secondary | ICD-10-CM | POA: Diagnosis not present

## 2021-01-13 DIAGNOSIS — R4189 Other symptoms and signs involving cognitive functions and awareness: Secondary | ICD-10-CM | POA: Diagnosis not present

## 2021-01-13 DIAGNOSIS — R531 Weakness: Secondary | ICD-10-CM | POA: Diagnosis not present

## 2021-01-13 DIAGNOSIS — I288 Other diseases of pulmonary vessels: Secondary | ICD-10-CM | POA: Diagnosis not present

## 2021-01-13 DIAGNOSIS — N318 Other neuromuscular dysfunction of bladder: Secondary | ICD-10-CM | POA: Diagnosis not present

## 2021-01-13 DIAGNOSIS — K851 Biliary acute pancreatitis without necrosis or infection: Secondary | ICD-10-CM | POA: Diagnosis not present

## 2021-01-13 DIAGNOSIS — Z7401 Bed confinement status: Secondary | ICD-10-CM | POA: Diagnosis not present

## 2021-01-13 DIAGNOSIS — M6281 Muscle weakness (generalized): Secondary | ICD-10-CM | POA: Diagnosis not present

## 2021-01-13 DIAGNOSIS — T839XXA Unspecified complication of genitourinary prosthetic device, implant and graft, initial encounter: Secondary | ICD-10-CM | POA: Diagnosis not present

## 2021-01-13 DIAGNOSIS — Z9049 Acquired absence of other specified parts of digestive tract: Secondary | ICD-10-CM | POA: Diagnosis not present

## 2021-01-13 DIAGNOSIS — R2689 Other abnormalities of gait and mobility: Secondary | ICD-10-CM | POA: Diagnosis not present

## 2021-01-13 DIAGNOSIS — I10 Essential (primary) hypertension: Secondary | ICD-10-CM | POA: Diagnosis not present

## 2021-01-13 DIAGNOSIS — K859 Acute pancreatitis without necrosis or infection, unspecified: Secondary | ICD-10-CM | POA: Diagnosis not present

## 2021-01-13 DIAGNOSIS — R2681 Unsteadiness on feet: Secondary | ICD-10-CM | POA: Diagnosis not present

## 2021-01-13 DIAGNOSIS — G8194 Hemiplegia, unspecified affecting left nondominant side: Secondary | ICD-10-CM | POA: Diagnosis not present

## 2021-01-13 DIAGNOSIS — F411 Generalized anxiety disorder: Secondary | ICD-10-CM | POA: Diagnosis not present

## 2021-01-13 DIAGNOSIS — M6259 Muscle wasting and atrophy, not elsewhere classified, multiple sites: Secondary | ICD-10-CM | POA: Diagnosis not present

## 2021-01-13 DIAGNOSIS — Z978 Presence of other specified devices: Secondary | ICD-10-CM | POA: Diagnosis not present

## 2021-01-13 DIAGNOSIS — I69354 Hemiplegia and hemiparesis following cerebral infarction affecting left non-dominant side: Secondary | ICD-10-CM | POA: Diagnosis not present

## 2021-01-13 DIAGNOSIS — R41841 Cognitive communication deficit: Secondary | ICD-10-CM | POA: Diagnosis not present

## 2021-01-13 DIAGNOSIS — M21372 Foot drop, left foot: Secondary | ICD-10-CM | POA: Diagnosis not present

## 2021-01-13 DIAGNOSIS — R739 Hyperglycemia, unspecified: Secondary | ICD-10-CM | POA: Diagnosis not present

## 2021-01-13 LAB — RESP PANEL BY RT-PCR (FLU A&B, COVID) ARPGX2
Influenza A by PCR: NEGATIVE
Influenza B by PCR: NEGATIVE
SARS Coronavirus 2 by RT PCR: NEGATIVE

## 2021-01-13 MED ORDER — BISACODYL 5 MG PO TBEC: 5.0000 mg | DELAYED_RELEASE_TABLET | Freq: Every day | ORAL | 0 refills | Status: DC | PRN

## 2021-01-13 MED ORDER — TRAZODONE HCL 50 MG PO TABS: 25.0000 mg | ORAL_TABLET | Freq: Every evening | ORAL | Status: DC | PRN

## 2021-01-13 MED ORDER — AMLODIPINE BESYLATE 5 MG PO TABS
5.0000 mg | ORAL_TABLET | Freq: Every day | ORAL | Status: DC
  Administered 2021-01-13: 5 mg via ORAL
  Filled 2021-01-13: qty 1

## 2021-01-13 MED ORDER — AMLODIPINE BESYLATE 5 MG PO TABS: 5.0000 mg | ORAL_TABLET | Freq: Every day | ORAL | Status: DC

## 2021-01-13 MED ORDER — HYDRALAZINE HCL 20 MG/ML IJ SOLN: 5.0000 mg | Freq: Three times a day (TID) | INTRAMUSCULAR | Status: DC | PRN

## 2021-01-13 NOTE — Progress Notes (Signed)
TOC CM spoke to [place], admission coordinator and she accepted him for SNF rehab. Just waiting on dc summary. Message sent to attending. [place], [place] ED TOC CM [phone]

## 2021-01-13 NOTE — TOC Transition Note (Addendum)
Transition of Care [facility]) - CM/SW Discharge Note   Patient Details  Name: Robert [name] MRN: [id number] Date of Birth: 08-May-1946  Transition of Care [facility]) CM/SW Contact:  Trish Mage, LCSW Phone Number: 01/13/2021, 1:35 PM   Clinical Narrative:  Patient who is stable for d/c will transfer to [facility] as soon as results of COVID test, which was place yesterday, have returned.  D/C summary and FL2 posted on HUB.  Nursing, please call for [facility] transport at [phone] and report to [phone], room 509P.  Cut off time for patient arrival is 8PM.   No further needs identified.    Addendum:  Called cytology lab to inquire about COVID results.  They do not have the specimen.  Referred me to [facility] lab.  They are supposed to call me back to let me know status.  May need to get rapid test. Standing by.  Addendum II:  Lab called back, found sample but said test would take 3.5 hours.  MD ordered rapid, which will result in turn around time of approximately 1.5 hours.  Charge nurse informed to watch for results, call [facility] when in.     Final next level of care: Skilled Nursing Facility Barriers to Discharge: Barriers Resolved   Patient Goals and CMS Choice Patient states their goals for this hospitalization and ongoing recovery are:: To go to rehab first then return back home with home health. CMS Medicare.gov Compare Post Acute Care list provided to:: Patient Represenative (must comment) Choice offered to / list presented to : Spouse  Discharge Placement                       Discharge Plan and Services     Post Acute Care Choice: [place]                               Social Determinants of Health (SDOH) Interventions     Readmission Risk Interventions Readmission Risk Prevention Plan 08/16/2019  Post Dischage Appt Not Complete  Appt Comments disposition tbd  Medication Screening Complete  Transportation Screening Complete  Some recent data  might be hidden

## 2021-01-13 NOTE — Progress Notes (Signed)
Alerted by night shift charge nurse Jacqulyn Bath that pt wife had a complaint about pt skin breakdown to groin and soiled skin.  Leadership rounded on patient and wife in the room, wife showed me pictures on her phone that she had took of pts groining showing redness and skin breakdown. Service recovery provided, MD and pt primary nurse Clent Ridges. Made aware of incident. No further concerns at this time.

## 2021-01-13 NOTE — Progress Notes (Signed)
4 Days Post-Op   Subjective/Chief Complaint: Doing well. No complaints.  Eating well and moving his bowels   Objective: Vital signs in last 24 hours: Temp:  [98.5 F (36.9 C)-99.8 F (37.7 C)] 98.9 F (37.2 C) (03/21 0543) Pulse Rate:  [76-87] 87 (03/21 0543) Resp:  [16-20] 20 (03/21 0543) BP: (141-171)/(75-87) 171/87 (03/21 0543) SpO2:  [95 %-97 %] 95 % (03/21 0543) Last BM Date: 01/12/21  Intake/Output from previous day: 03/20 0701 - 03/21 0700 In: 240 [P.O.:240] Out: 1900 [Urine:1900] Intake/Output this shift: No intake/output data recorded.  PE: Abd: soft, NT, ND, incisions c/d/i with dressings in place.  Lab Results:  No results for input(s): WBC, HGB, HCT, PLT in the last 72 hours. BMET No results for input(s): NA, K, CL, CO2, GLUCOSE, BUN, CREATININE, CALCIUM in the last 72 hours. PT/INR No results for input(s): LABPROT, INR in the last 72 hours. ABG No results for input(s): PHART, HCO3 in the last 72 hours.  Invalid input(s): PCO2, PO2  Studies/Results: No results found.  Anti-infectives: Anti-infectives (From admission, onward)   Start     Dose/Rate Route Frequency Ordered Stop   01/09/21 1030  cefTRIAXone (ROCEPHIN) 1 g in dextrose 5 % 50 mL IVPB  Status:  Discontinued        1 g 120 mL/hr over 30 Minutes Intravenous  Once 01/09/21 0932 01/09/21 0952   01/09/21 1030  cefTRIAXone (ROCEPHIN) 2 g in sodium chloride 0.9 % 100 mL IVPB        2 g 200 mL/hr over 30 Minutes Intravenous  Once 01/09/21 4944 01/09/21 1106   01/09/21 0934  sodium chloride 0.9 % with cefTRIAXone (ROCEPHIN) ADS Med       Note to Pharmacy: Charmayne Sheer   : cabinet override      01/09/21 0934 01/09/21 1051   01/05/21 2000  cefTRIAXone (ROCEPHIN) 1 g in sodium chloride 0.9 % 100 mL IVPB  Status:  Discontinued        1 g 200 mL/hr over 30 Minutes Intravenous Every 24 hours 01/05/21 0429 01/10/21 1104   01/04/21 1900  cefTRIAXone (ROCEPHIN) 1 g in sodium chloride 0.9 % 100 mL IVPB         1 g 200 mL/hr over 30 Minutes Intravenous  Once 01/04/21 1857 01/04/21 2022      Assessment/Plan: POD 4, S/P laparoscopic cholecystectomy w/ IOC 01/09/21 Dr. Johney Maine - hgb stable - continue eliquis - soft diet - mobilize/pulm toilet - stable for discharge to SNF when available  HQP:RFFM diet ID: Rocephin 3/12>>done BWG:[id number] Follow-up: [facility] postop already set up  Henreitta Cea 01/13/2021

## 2021-01-13 NOTE — Discharge Summary (Signed)
Physician Discharge Summary  Robert [name] YQM:[id number] DOB: [birth year] DOA: 01/04/2021  PCP: [name], MD  Admit date: 01/04/2021 Discharge date: 01/13/2021  Time spent: 50* minutes  Recommendations for Outpatient Follow-up:  1. 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  In the ED was found to have acute pancreatitis and abnormal UA.  CT also showed numerous gallstones with no gallbladder, mildly distended, no pericholecystic inflammatory changes.  Patient's abdominal pain resolved, pancreatic enzymes normalized.  Surgery was consulted for possible gallstone pancreatitis.  Plan for lap chole on Thursday pending cardiology clearance.  Hospital Course:   1. Gallstone pancreatitis-liver enzymes have improved, lipase is down to 36.  Ultrasound abdomen showed multiple gallstones in the gallbladder lumen, no pericholecystic fluid.  General surgery consulted, patient underwent laparoscopic  cholecystectomy.    Patient to follow-up with general surgery on 01/30/2021. 2. Asymptomatic bacteriuria versus UTI-patient has chronic indwelling Foley catheter, abnormal UA.  Lactic acid was 1.0 on admission.  Urine culture was not obtained at the time of admission.  He has been on antibiotics since 01/04/2021.  WBC is down to 8.3.  Completed 7 days of treatment with IV antibiotics.    Ceftriaxone was discontinued. 3. History of CVA with left hemiparesis-PT evaluation done, recommend skilled nursing facility on discharge. 4. Hypertension-blood pressure is stable, continue losartan.  Blood pressure is elevated, will add amlodipine 5 mg p.o. daily. 5. History of Parkinson disease-continue carbidopa levodopa, rasagiline. 6. Skin rash-noted on buttocks and scrotum.    Resolved after Desitin cream was applied. 7. History of DVT-s/p IVC filter, Eliquis for anticoagulation 8. Chronic indwelling Foley catheter-has been leaking around Foley catheter which was replaced on 01/22/2009, leaking resolved after changing the catheter.   Procedures:  Laparoscopic cholecystectomy  Consultations:  General surgery  Discharge Exam: Vitals:   01/12/21 2123 01/13/21 0543  BP: (!) 143/75 (!) 171/87  Pulse: 79 87  Resp: 16 20  Temp: 98.5 F (36.9 C) 98.9 F (37.2 C)  SpO2: 96% 95%    General: Appears in no acute distress Cardiovascular: S1-S2, regular Respiratory: Clear to auscultation bilaterally  Discharge Instructions   Discharge Instructions    Diet - low sodium heart healthy   Complete by: As directed    Discharge wound care:   Complete by: As directed    Wound care to partial-thickness stage II pressure injury to sacrum-cleans with normal saline, pat dry.  Cover with size appropriate piece of Xeroform gauze, top with dry gauze 2 x 2 and covered with silicone  foam.  Turn patient from side to side minimize time in supine position.   Increase activity slowly   Complete by: As directed       Allergies as of 01/13/2021      Reactions   Propofol Other (See Comments)   "hiccups for weeks"   Lisinopril    Other reaction(s): Cough      Medication List    TAKE these medications   acetaminophen 500 MG tablet Commonly known as: TYLENOL Take 1,000 mg by mouth every 6 (six) hours as needed for moderate pain.   amLODipine 5 MG tablet Commonly known as: NORVASC Take 1 tablet (5 mg total) by mouth daily. Start taking on: January 14, 2021   apixaban 5 MG Tabs tablet Commonly known as: ELIQUIS Take 1 tablet (5 mg total) by mouth 2 (two) times daily.   bisacodyl 5 MG EC tablet Commonly known as: DULCOLAX Take 1 tablet (5 mg total) by mouth daily as needed for moderate constipation.   busPIRone 7.5 MG tablet Commonly known as: BUSPAR Take 7.5 mg by mouth 2 (two) times daily.   Carbidopa-Levodopa ER 25-100 MG tablet controlled release Commonly known as: SINEMET CR TAKE TWO TABLETS BY MOUTH THREE TIMES DAILY What changed:   how much to take  when to take this  additional instructions   lidocaine 5 % Commonly known as: Lidoderm Place 1 patch onto the skin daily. Remove & Discard patch within 12 hours or as directed by MD   loratadine 10 MG tablet Commonly known as: CLARITIN Take 10 mg by mouth daily.   losartan 50 MG tablet Commonly known as: COZAAR Take 50 mg by mouth daily.   Nuplazid 34 MG Caps Generic drug: Pimavanserin Tartrate Take 34 mg by mouth daily.   omeprazole 20 MG capsule Commonly known as: PRILOSEC Take 20 mg by mouth daily as needed (heartburn).   polyethylene glycol 17 g packet Commonly known as: MiraLax Take 17 g by mouth daily. What changed:   when to take this  reasons to take this   rasagiline 1 MG Tabs tablet Commonly known as: AZILECT Take 1 mg by mouth daily.   Simethicone 250 MG Caps Take 250 mg by mouth daily as needed (bloating).   simvastatin 20 MG tablet Commonly known as: ZOCOR Take 20 mg by mouth every  evening.   tamsulosin 0.4 MG Caps capsule Commonly known as: FLOMAX Take 0.4 mg by mouth daily.   traZODone 50 MG tablet Commonly known as: DESYREL Take 0.5 tablets (25 mg total) by mouth at bedtime as needed for sleep.            Discharge Care Instructions  (From admission, onward)         Start     Ordered   01/13/21 0000  Discharge wound care:       Comments: Wound care to partial-thickness stage II pressure injury to sacrum-cleans with normal saline, pat dry.  Cover with size appropriate piece of Xeroform gauze, top with dry gauze 2 x 2 and covered with silicone foam.  Turn patient from side to side minimize time in supine position.   01/13/21 1154         Allergies  Allergen Reactions  . Propofol Other (See Comments)    "hiccups for weeks"  . Lisinopril     Other reaction(s): Cough    Follow-up Information    Surgery, [facility] Follow up on 01/30/2021.   Specialty: General Surgery Why: 4:00pm, arrive by  3:30pm for paperwork and check in process Contact information: [place] [address] [phone]                The results of significant diagnostics from this hospitalization (including imaging, microbiology, ancillary and laboratory) are listed below for reference.    Significant Diagnostic Studies: DG Cholangiogram Operative  Result Date: 01/09/2021 CLINICAL DATA:  75 year old male with cholelithiasis EXAM: INTRAOPERATIVE CHOLANGIOGRAM TECHNIQUE: Cholangiographic images from the C-arm fluoroscopic device were submitted for interpretation post-operatively. Please see the procedural report for the amount of contrast and the fluoroscopy time utilized. COMPARISON:  Ultrasound 01/05/2021 FINDINGS: Surgical instruments project over the upper abdomen. There is cannulation of the cystic duct/gallbladder neck, with antegrade infusion of contrast. Caliber of the extrahepatic ductal system within normal limits. No definite filling  defect within the extrahepatic ducts identified. Free flow of contrast across the ampulla. IMPRESSION: Intraoperative cholangiogram demonstrates extrahepatic biliary ducts of unremarkable caliber, with no definite filling defects identified. Free flow of contrast across the ampulla. Please refer to the dictated operative report for full details of intraoperative findings and procedure Electronically Signed   By: Corrie Mckusick D.O.   On: 01/09/2021 12:00   CT Abdomen Pelvis W Contrast  Result Date: 01/04/2021 CLINICAL DATA:  Fever, vomiting EXAM: CT ABDOMEN AND PELVIS WITH CONTRAST TECHNIQUE: Multidetector CT imaging of the abdomen and pelvis was performed using the standard protocol following bolus administration of intravenous contrast. CONTRAST:  119mL OMNIPAQUE IOHEXOL 300 MG/ML  SOLN COMPARISON:  11/09/2020 FINDINGS: Lower chest: Coronary artery calcifications. Main pulmonary trunk measures 3.3 cm in diameter. No basilar airspace opacity. Hepatobiliary: There are numerous gallstones within the gallbladder lumen, many of which containing gas. Gallbladder is mildly distended. No definite pericholecystic inflammatory changes. Mild intrahepatic biliary dilatation. No focal liver lesion identified on unenhanced imaging. Pancreas: Peripancreatic fat stranding throughout the length of the pancreatic body and tail. No peripancreatic fluid collections. Spleen: Normal in size without focal abnormality. Adrenals/Urinary Tract: Unremarkable adrenal glands. Kidneys enhance symmetrically without suspicious lesion, stone, or hydronephrosis. Probable subcentimeter left renal cyst. Ureters are nondilated. Urinary bladder decompressed by Foley catheter. Small amount of fluid within known right-sided bladder diverticula. Stomach/Bowel: Stomach is within normal limits. Appendix appears normal (series 5, image 65). No evidence of bowel wall thickening, distention, or inflammatory changes. Mild-moderate volume of stool throughout  the colon. Vascular/Lymphatic: Atherosclerosis throughout the aortoiliac axis without aneurysm. IVC filter noted. No lymphadenopathy. Reproductive: Brachytherapy seeds within the prostate gland. Other: No free fluid. No abdominopelvic fluid collection. No pneumoperitoneum. No abdominal wall hernia. Musculoskeletal: No acute osseous findings. Flowing anterior endplate osteophytes throughout the thoracic and lumbar spine suggesting diffuse idiopathic skeletal hyperostosis or possibly ankylosing spondylitis. Degenerative changes of the bilateral hips. Partial ankylosis of the sacroiliac joints. IMPRESSION: 1. Findings suggestive of mild acute pancreatitis. 2. Cholelithiasis with mild gallbladder distention. No definite pericholecystic inflammatory changes. If there is clinical concern for acute cholecystitis, a right upper quadrant ultrasound could be performed for further evaluation. 3. Mild intrahepatic biliary dilatation. 4. Mild-moderate volume of stool throughout the colon. 5. Mildly dilated main pulmonary trunk suggesting pulmonary arterial hypertension. 6. Aortic atherosclerosis (ICD10-I70.0). Electronically Signed   By: Davina Poke D.O.   On: 01/04/2021 19:26   DG Chest Port 1 View  Result Date: 01/04/2021 CLINICAL DATA:  Fever, vomiting EXAM: PORTABLE CHEST 1 VIEW COMPARISON:  11/27/2020 FINDINGS: The heart size and mediastinal contours are within normal limits. Both lungs are clear. The visualized skeletal structures  are unremarkable. IMPRESSION: No active disease. Electronically Signed   By: Randa Ngo M.D.   On: 01/04/2021 17:59   ECHOCARDIOGRAM COMPLETE  Result Date: 01/08/2021    ECHOCARDIOGRAM REPORT   Patient Name:   Robert [name] Date of Exam: 01/08/2021 Medical Rec #:  [id number]      Height:       74.0 in Accession #:    [id number]     Weight:       244.0 lb Date of Birth:  01/02/1946      BSA:          2.365 m Patient Age:    17 years       BP:           152/82 mmHg Patient Gender: M               HR:           77 bpm. Exam Location:  Inpatient Procedure: 2D Echo Indications:    Pre-operative Cardiocascular examination Z01.810  History:        Patient has no prior history of Echocardiogram examinations.                 Risk Factors:Hypertension and Dyslipidemia.  Sonographer:    Mikki Santee RDCS (AE) Referring Phys: [id number] [place]  1. Left ventricular ejection fraction, by estimation, is 55 to 60%. The left ventricle has normal function. The left ventricle has no regional wall motion abnormalities. There is moderate concentric left ventricular hypertrophy. Left ventricular diastolic parameters are consistent with Grade I diastolic dysfunction (impaired relaxation).  2. Right ventricular systolic function is normal. The right ventricular size is normal.  3. The mitral valve is grossly normal. Trivial mitral valve regurgitation. No evidence of mitral stenosis.  4. The aortic valve is tricuspid. Aortic valve regurgitation is not visualized. No aortic stenosis is present. FINDINGS  Left Ventricle: Left ventricular ejection fraction, by estimation, is 55 to 60%. The left ventricle has normal function. The left ventricle has no regional wall motion abnormalities. The left ventricular internal cavity size was normal in size. There is  moderate concentric left ventricular hypertrophy. Left ventricular diastolic parameters are consistent with Grade I diastolic dysfunction (impaired relaxation). Right Ventricle: The right ventricular size is normal. No increase in right ventricular wall thickness. Right ventricular systolic function is normal. Left Atrium: Left atrial size was normal in size. Right Atrium: Right atrial size was normal in size. Pericardium: Trivial pericardial effusion is present. Mitral Valve: The mitral valve is grossly normal. Trivial mitral valve regurgitation. No evidence of mitral valve stenosis. Tricuspid Valve: The tricuspid valve is grossly normal.  Tricuspid valve regurgitation is trivial. No evidence of tricuspid stenosis. Aortic Valve: The aortic valve is tricuspid. Aortic valve regurgitation is not visualized. No aortic stenosis is present. Pulmonic Valve: The pulmonic valve was grossly normal. Pulmonic valve regurgitation is mild. No evidence of pulmonic stenosis. Aorta: The aortic root and ascending aorta are structurally normal, with no evidence of dilitation. Venous: The inferior vena cava was not well visualized. IAS/Shunts: The atrial septum is grossly normal.  LEFT VENTRICLE PLAX 2D LVIDd:         4.50 cm  Diastology LVIDs:         3.10 cm  LV e' medial:    5.11 cm/s LV PW:         1.30 cm  LV E/e' medial:  11.1 LV IVS:        1.40 cm  LV e' lateral:   6.75 cm/s LVOT diam:     2.30 cm  LV E/e' lateral: 8.4 LV SV:         55 LV SV Index:   23 LVOT Area:     4.15 cm  RIGHT VENTRICLE RV S prime:     10.10 cm/s TAPSE (M-mode): 1.0 cm LEFT ATRIUM             Index       RIGHT ATRIUM           Index LA diam:        3.40 cm 1.44 cm/m  RA Area:     12.20 cm LA Vol (A2C):   53.2 ml 22.49 ml/m RA Volume:   21.40 ml  9.05 ml/m LA Vol (A4C):   44.3 ml 18.73 ml/m LA Biplane Vol: 51.9 ml 21.94 ml/m  AORTIC VALVE LVOT Vmax:   72.40 cm/s LVOT Vmean:  48.100 cm/s LVOT VTI:    0.132 m  AORTA Ao Root diam: 3.80 cm MITRAL VALVE               TRICUSPID VALVE MV Area (PHT): 2.60 cm    TR Peak grad:   22.3 mmHg MV Decel Time: 292 msec    TR Vmax:        236.00 cm/s MV E velocity: 56.80 cm/s MV A velocity: 78.10 cm/s  SHUNTS MV E/A ratio:  0.73        Systemic VTI:  0.13 m                            Systemic Diam: 2.30 cm Eleonore Chiquito MD Electronically signed by Eleonore Chiquito MD Signature Date/Time: 01/08/2021/4:16:21 PM    Final    US Abdomen Limited RUQ (LIVER/GB)  Result Date: 01/05/2021 CLINICAL DATA:  Acute pancreatitis EXAM: ULTRASOUND ABDOMEN LIMITED RIGHT UPPER QUADRANT COMPARISON:  None. FINDINGS: Gallbladder: Visualization limited by a shadowing from  numerous gallstones. A negative sonographic Percell Miller sign was reported by the sonographer. No pericholecystic fluid or gallbladder wall thickening. Common bile duct: Diameter: 4 mm Liver: No focal lesion identified. Within normal limits in parenchymal echogenicity. Portal vein is patent on color Doppler imaging with normal direction of blood flow towards the liver. Other: None. IMPRESSION: Cholelithiasis without other evidence of acute cholecystitis. Electronically Signed   By: Ulyses Jarred M.D.   On: 01/05/2021 00:37    Microbiology: Recent Results (from the past 240 hour(s))  SARS CORONAVIRUS 2 (TAT 6-24 HRS) Nasopharyngeal Nasopharyngeal Swab     Status: None   Collection Time: 01/04/21  7:32 PM   Specimen: Nasopharyngeal Swab  Result Value Ref Range Status   SARS Coronavirus 2 NEGATIVE NEGATIVE Final    Comment: (NOTE) SARS-CoV-2 target nucleic acids are NOT DETECTED.  The SARS-CoV-2 RNA is generally detectable in upper and lower respiratory specimens during the acute phase of infection. Negative results do not preclude SARS-CoV-2 infection, do not rule out co-infections with other pathogens, and should not be used as the sole basis for treatment or other patient management decisions. Negative results must be combined with clinical observations, patient history, and epidemiological information. The expected result is Negative.  Fact Sheet for Patients: SugarRoll.be  Fact Sheet for Healthcare Providers: https://www.woods-mathews.com/  This test is not yet approved or cleared by the [place] FDA and  has been authorized for detection and/or diagnosis of SARS-CoV-2 by FDA under an Emergency Use Authorization (  EUA). This EUA will remain  in effect (meaning this test can be used) for the duration of the COVID-19 declaration under Se ction 564(b)(1) of the Act, 21 U.S.C. section 360bbb-3(b)(1), unless the authorization is terminated  or revoked sooner.  Performed at [facility], [place] [address] [place], [place] [address]      Labs: Basic Metabolic Panel: Recent Labs  Lab 01/07/21 0340 01/10/21 0401  NA 138 139  K 3.7 4.1  CL 106 107  CO2 24 23  GLUCOSE 159* 212*  BUN 8 10  CREATININE 0.90 0.89  CALCIUM 8.5* 9.1   Liver Function Tests: Recent Labs  Lab 01/07/21 0340 01/10/21 0401  AST 25 76*  ALT 30 48*  ALKPHOS 82 77  BILITOT 1.2 0.9  PROT 6.1* 6.6  ALBUMIN 2.8* 3.1*   No results for input(s): LIPASE, AMYLASE in the last 168 hours. No results for input(s): AMMONIA in the last 168 hours. CBC: Recent Labs  Lab 01/07/21 0340 01/09/21 0350 01/10/21 0401  WBC 8.3 6.9 13.8*  HGB 12.8* 13.4 13.5  HCT 40.1 41.9 41.4  MCV 92.4 92.1 90.2  PLT 258 322 387   Cardiac Enzymes: No results for input(s): CKTOTAL, CKMB, CKMBINDEX, TROPONINI in the last 168 hours. BNP: BNP (last 3 results) Recent Labs    01/04/21 1711  BNP 111.1*    ProBNP (last 3 results) No results for input(s): PROBNP in the last 8760 hours.  CBG: No results for input(s): GLUCAP in the last 168 hours.     Signed:  Oswald Hillock MD.  [facility] 01/13/2021, 11:55 AM

## 2021-01-13 NOTE — Care Management Important Message (Signed)
Important Message  Patient Details IM Letter given to the Patient. Name: Robert [name] MRN: [id number] Date of Birth: Sep 09, 1946   Medicare Important Message Given:  Yes     [name] 01/13/2021, 11:41 AM

## 2021-01-13 NOTE — Plan of Care (Signed)
  Problem: Education: Goal: Knowledge of General Education information will improve Description Including pain rating scale, medication(s)/side effects and non-pharmacologic comfort measures Outcome: Progressing   

## 2021-01-14 DIAGNOSIS — R4189 Other symptoms and signs involving cognitive functions and awareness: Secondary | ICD-10-CM | POA: Diagnosis not present

## 2021-01-14 DIAGNOSIS — I10 Essential (primary) hypertension: Secondary | ICD-10-CM | POA: Diagnosis not present

## 2021-01-14 DIAGNOSIS — I288 Other diseases of pulmonary vessels: Secondary | ICD-10-CM | POA: Diagnosis not present

## 2021-01-14 DIAGNOSIS — R739 Hyperglycemia, unspecified: Secondary | ICD-10-CM | POA: Diagnosis not present

## 2021-01-14 DIAGNOSIS — T839XXA Unspecified complication of genitourinary prosthetic device, implant and graft, initial encounter: Secondary | ICD-10-CM | POA: Diagnosis not present

## 2021-01-14 DIAGNOSIS — D72829 Elevated white blood cell count, unspecified: Secondary | ICD-10-CM | POA: Diagnosis not present

## 2021-01-14 DIAGNOSIS — N319 Neuromuscular dysfunction of bladder, unspecified: Secondary | ICD-10-CM | POA: Diagnosis not present

## 2021-01-14 DIAGNOSIS — L89152 Pressure ulcer of sacral region, stage 2: Secondary | ICD-10-CM | POA: Diagnosis not present

## 2021-01-14 DIAGNOSIS — K851 Biliary acute pancreatitis without necrosis or infection: Secondary | ICD-10-CM | POA: Diagnosis not present

## 2021-01-14 DIAGNOSIS — N390 Urinary tract infection, site not specified: Secondary | ICD-10-CM | POA: Diagnosis not present

## 2021-01-14 DIAGNOSIS — G20 Parkinson's disease: Secondary | ICD-10-CM | POA: Diagnosis not present

## 2021-01-14 DIAGNOSIS — Z9049 Acquired absence of other specified parts of digestive tract: Secondary | ICD-10-CM | POA: Diagnosis not present

## 2021-01-16 DIAGNOSIS — Z86718 Personal history of other venous thrombosis and embolism: Secondary | ICD-10-CM | POA: Diagnosis not present

## 2021-01-16 DIAGNOSIS — G8194 Hemiplegia, unspecified affecting left nondominant side: Secondary | ICD-10-CM | POA: Diagnosis not present

## 2021-01-16 DIAGNOSIS — N390 Urinary tract infection, site not specified: Secondary | ICD-10-CM | POA: Diagnosis not present

## 2021-01-16 DIAGNOSIS — K219 Gastro-esophageal reflux disease without esophagitis: Secondary | ICD-10-CM | POA: Diagnosis not present

## 2021-01-16 DIAGNOSIS — N318 Other neuromuscular dysfunction of bladder: Secondary | ICD-10-CM | POA: Diagnosis not present

## 2021-01-16 DIAGNOSIS — Z9049 Acquired absence of other specified parts of digestive tract: Secondary | ICD-10-CM | POA: Diagnosis not present

## 2021-01-16 DIAGNOSIS — M21372 Foot drop, left foot: Secondary | ICD-10-CM | POA: Diagnosis not present

## 2021-01-16 DIAGNOSIS — I10 Essential (primary) hypertension: Secondary | ICD-10-CM | POA: Diagnosis not present

## 2021-01-16 DIAGNOSIS — E782 Mixed hyperlipidemia: Secondary | ICD-10-CM | POA: Diagnosis not present

## 2021-01-16 DIAGNOSIS — N401 Enlarged prostate with lower urinary tract symptoms: Secondary | ICD-10-CM | POA: Diagnosis not present

## 2021-01-16 DIAGNOSIS — G20 Parkinson's disease: Secondary | ICD-10-CM | POA: Diagnosis not present

## 2021-01-16 DIAGNOSIS — M6281 Muscle weakness (generalized): Secondary | ICD-10-CM | POA: Diagnosis not present

## 2021-01-16 DIAGNOSIS — K859 Acute pancreatitis without necrosis or infection, unspecified: Secondary | ICD-10-CM | POA: Diagnosis not present

## 2021-01-16 DIAGNOSIS — R2681 Unsteadiness on feet: Secondary | ICD-10-CM | POA: Diagnosis not present

## 2021-01-20 DIAGNOSIS — R2681 Unsteadiness on feet: Secondary | ICD-10-CM | POA: Diagnosis not present

## 2021-01-20 DIAGNOSIS — M6281 Muscle weakness (generalized): Secondary | ICD-10-CM | POA: Diagnosis not present

## 2021-01-20 DIAGNOSIS — E782 Mixed hyperlipidemia: Secondary | ICD-10-CM | POA: Diagnosis not present

## 2021-01-20 DIAGNOSIS — G8194 Hemiplegia, unspecified affecting left nondominant side: Secondary | ICD-10-CM | POA: Diagnosis not present

## 2021-01-20 DIAGNOSIS — K219 Gastro-esophageal reflux disease without esophagitis: Secondary | ICD-10-CM | POA: Diagnosis not present

## 2021-01-20 DIAGNOSIS — M21372 Foot drop, left foot: Secondary | ICD-10-CM | POA: Diagnosis not present

## 2021-01-20 DIAGNOSIS — N401 Enlarged prostate with lower urinary tract symptoms: Secondary | ICD-10-CM | POA: Diagnosis not present

## 2021-01-20 DIAGNOSIS — G20 Parkinson's disease: Secondary | ICD-10-CM | POA: Diagnosis not present

## 2021-01-20 DIAGNOSIS — N390 Urinary tract infection, site not specified: Secondary | ICD-10-CM | POA: Diagnosis not present

## 2021-01-20 DIAGNOSIS — I10 Essential (primary) hypertension: Secondary | ICD-10-CM | POA: Diagnosis not present

## 2021-01-20 DIAGNOSIS — Z86718 Personal history of other venous thrombosis and embolism: Secondary | ICD-10-CM | POA: Diagnosis not present

## 2021-01-20 DIAGNOSIS — K859 Acute pancreatitis without necrosis or infection, unspecified: Secondary | ICD-10-CM | POA: Diagnosis not present

## 2021-01-22 ENCOUNTER — Other Ambulatory Visit: Payer: Self-pay | Admitting: *Deleted

## 2021-01-22 DIAGNOSIS — I69354 Hemiplegia and hemiparesis following cerebral infarction affecting left non-dominant side: Secondary | ICD-10-CM | POA: Diagnosis not present

## 2021-01-22 DIAGNOSIS — N318 Other neuromuscular dysfunction of bladder: Secondary | ICD-10-CM | POA: Diagnosis not present

## 2021-01-22 NOTE — Patient Outreach (Signed)
Member screened for potential [facility] Care Management needs.  Per [place] (Patient Robert [name]) Mr. Grabel resides in [facility].   Communication sent to facility SW to inquire about anticipated transition plans.   Will continue to follow for potential [facility] services.   Marthenia Rolling, MSN, RN,BSN [place] Acute Care Coordinator [phone] [facility]) [phone]  (Toll free office)

## 2021-01-24 DIAGNOSIS — E119 Type 2 diabetes mellitus without complications: Secondary | ICD-10-CM | POA: Diagnosis not present

## 2021-01-24 DIAGNOSIS — Z0189 Encounter for other specified special examinations: Secondary | ICD-10-CM | POA: Diagnosis not present

## 2021-01-24 DIAGNOSIS — R0989 Other specified symptoms and signs involving the circulatory and respiratory systems: Secondary | ICD-10-CM | POA: Diagnosis not present

## 2021-01-27 DIAGNOSIS — G20 Parkinson's disease: Secondary | ICD-10-CM | POA: Diagnosis not present

## 2021-01-27 DIAGNOSIS — F411 Generalized anxiety disorder: Secondary | ICD-10-CM | POA: Diagnosis not present

## 2021-01-27 DIAGNOSIS — R4189 Other symptoms and signs involving cognitive functions and awareness: Secondary | ICD-10-CM | POA: Diagnosis not present

## 2021-01-27 DIAGNOSIS — M21372 Foot drop, left foot: Secondary | ICD-10-CM | POA: Diagnosis not present

## 2021-01-27 DIAGNOSIS — Z86718 Personal history of other venous thrombosis and embolism: Secondary | ICD-10-CM | POA: Diagnosis not present

## 2021-01-27 DIAGNOSIS — K859 Acute pancreatitis without necrosis or infection, unspecified: Secondary | ICD-10-CM | POA: Diagnosis not present

## 2021-01-27 DIAGNOSIS — E782 Mixed hyperlipidemia: Secondary | ICD-10-CM | POA: Diagnosis not present

## 2021-01-27 DIAGNOSIS — R2681 Unsteadiness on feet: Secondary | ICD-10-CM | POA: Diagnosis not present

## 2021-01-27 DIAGNOSIS — K219 Gastro-esophageal reflux disease without esophagitis: Secondary | ICD-10-CM | POA: Diagnosis not present

## 2021-01-27 DIAGNOSIS — M6281 Muscle weakness (generalized): Secondary | ICD-10-CM | POA: Diagnosis not present

## 2021-01-27 DIAGNOSIS — I10 Essential (primary) hypertension: Secondary | ICD-10-CM | POA: Diagnosis not present

## 2021-01-27 DIAGNOSIS — G8194 Hemiplegia, unspecified affecting left nondominant side: Secondary | ICD-10-CM | POA: Diagnosis not present

## 2021-01-27 DIAGNOSIS — N401 Enlarged prostate with lower urinary tract symptoms: Secondary | ICD-10-CM | POA: Diagnosis not present

## 2021-01-27 DIAGNOSIS — N390 Urinary tract infection, site not specified: Secondary | ICD-10-CM | POA: Diagnosis not present

## 2021-01-30 DIAGNOSIS — E782 Mixed hyperlipidemia: Secondary | ICD-10-CM | POA: Diagnosis not present

## 2021-01-30 DIAGNOSIS — M6281 Muscle weakness (generalized): Secondary | ICD-10-CM | POA: Diagnosis not present

## 2021-01-30 DIAGNOSIS — N401 Enlarged prostate with lower urinary tract symptoms: Secondary | ICD-10-CM | POA: Diagnosis not present

## 2021-01-30 DIAGNOSIS — G8194 Hemiplegia, unspecified affecting left nondominant side: Secondary | ICD-10-CM | POA: Diagnosis not present

## 2021-01-30 DIAGNOSIS — Z86718 Personal history of other venous thrombosis and embolism: Secondary | ICD-10-CM | POA: Diagnosis not present

## 2021-01-30 DIAGNOSIS — K219 Gastro-esophageal reflux disease without esophagitis: Secondary | ICD-10-CM | POA: Diagnosis not present

## 2021-01-30 DIAGNOSIS — G20 Parkinson's disease: Secondary | ICD-10-CM | POA: Diagnosis not present

## 2021-01-30 DIAGNOSIS — R2681 Unsteadiness on feet: Secondary | ICD-10-CM | POA: Diagnosis not present

## 2021-01-30 DIAGNOSIS — M21372 Foot drop, left foot: Secondary | ICD-10-CM | POA: Diagnosis not present

## 2021-01-30 DIAGNOSIS — N390 Urinary tract infection, site not specified: Secondary | ICD-10-CM | POA: Diagnosis not present

## 2021-01-30 DIAGNOSIS — I10 Essential (primary) hypertension: Secondary | ICD-10-CM | POA: Diagnosis not present

## 2021-01-30 DIAGNOSIS — K859 Acute pancreatitis without necrosis or infection, unspecified: Secondary | ICD-10-CM | POA: Diagnosis not present

## 2021-01-31 ENCOUNTER — Other Ambulatory Visit: Payer: Self-pay | Admitting: *Deleted

## 2021-01-31 DIAGNOSIS — T83018A Breakdown (mechanical) of other indwelling urethral catheter, initial encounter: Secondary | ICD-10-CM | POA: Diagnosis not present

## 2021-01-31 DIAGNOSIS — N318 Other neuromuscular dysfunction of bladder: Secondary | ICD-10-CM | POA: Diagnosis not present

## 2021-01-31 DIAGNOSIS — Z9049 Acquired absence of other specified parts of digestive tract: Secondary | ICD-10-CM | POA: Diagnosis not present

## 2021-01-31 DIAGNOSIS — I10 Essential (primary) hypertension: Secondary | ICD-10-CM | POA: Diagnosis not present

## 2021-01-31 NOTE — Patient Outreach (Signed)
[name]) Care Management  01/31/2021  Robert [name] June 11, 1946 [id number]   RN case closure. Patient is in [facility].  [place] Care Management [phone]

## 2021-02-03 DIAGNOSIS — Z86718 Personal history of other venous thrombosis and embolism: Secondary | ICD-10-CM | POA: Diagnosis not present

## 2021-02-03 DIAGNOSIS — K219 Gastro-esophageal reflux disease without esophagitis: Secondary | ICD-10-CM | POA: Diagnosis not present

## 2021-02-03 DIAGNOSIS — R2681 Unsteadiness on feet: Secondary | ICD-10-CM | POA: Diagnosis not present

## 2021-02-03 DIAGNOSIS — N390 Urinary tract infection, site not specified: Secondary | ICD-10-CM | POA: Diagnosis not present

## 2021-02-03 DIAGNOSIS — I10 Essential (primary) hypertension: Secondary | ICD-10-CM | POA: Diagnosis not present

## 2021-02-03 DIAGNOSIS — K859 Acute pancreatitis without necrosis or infection, unspecified: Secondary | ICD-10-CM | POA: Diagnosis not present

## 2021-02-03 DIAGNOSIS — E782 Mixed hyperlipidemia: Secondary | ICD-10-CM | POA: Diagnosis not present

## 2021-02-03 DIAGNOSIS — M6281 Muscle weakness (generalized): Secondary | ICD-10-CM | POA: Diagnosis not present

## 2021-02-03 DIAGNOSIS — N401 Enlarged prostate with lower urinary tract symptoms: Secondary | ICD-10-CM | POA: Diagnosis not present

## 2021-02-03 DIAGNOSIS — G8194 Hemiplegia, unspecified affecting left nondominant side: Secondary | ICD-10-CM | POA: Diagnosis not present

## 2021-02-03 DIAGNOSIS — G20 Parkinson's disease: Secondary | ICD-10-CM | POA: Diagnosis not present

## 2021-02-03 DIAGNOSIS — M21372 Foot drop, left foot: Secondary | ICD-10-CM | POA: Diagnosis not present

## 2021-02-04 ENCOUNTER — Other Ambulatory Visit: Payer: Self-pay | Admitting: *Deleted

## 2021-02-04 NOTE — Patient Outreach (Signed)
[facility] Post- Acute Care Coordinator follow up. Member screened for potential [facility] Care Management needs.  [name] is receiving skilled therapy at [facility]. Update received from SNF SW indicating member will return home with spouse upon SNF transition.   Will continue to follow while member resides in SNF. Will plan outreach to discuss [facility] follow up as appropriate.    Marthenia Rolling, MSN, RN,BSN [place] Acute Care Coordinator [phone] [facility]) [phone]  (Toll free office)

## 2021-02-10 DIAGNOSIS — G8194 Hemiplegia, unspecified affecting left nondominant side: Secondary | ICD-10-CM | POA: Diagnosis not present

## 2021-02-10 DIAGNOSIS — K859 Acute pancreatitis without necrosis or infection, unspecified: Secondary | ICD-10-CM | POA: Diagnosis not present

## 2021-02-10 DIAGNOSIS — M6281 Muscle weakness (generalized): Secondary | ICD-10-CM | POA: Diagnosis not present

## 2021-02-10 DIAGNOSIS — N390 Urinary tract infection, site not specified: Secondary | ICD-10-CM | POA: Diagnosis not present

## 2021-02-10 DIAGNOSIS — R2681 Unsteadiness on feet: Secondary | ICD-10-CM | POA: Diagnosis not present

## 2021-02-10 DIAGNOSIS — Z86718 Personal history of other venous thrombosis and embolism: Secondary | ICD-10-CM | POA: Diagnosis not present

## 2021-02-10 DIAGNOSIS — K219 Gastro-esophageal reflux disease without esophagitis: Secondary | ICD-10-CM | POA: Diagnosis not present

## 2021-02-10 DIAGNOSIS — E782 Mixed hyperlipidemia: Secondary | ICD-10-CM | POA: Diagnosis not present

## 2021-02-10 DIAGNOSIS — N401 Enlarged prostate with lower urinary tract symptoms: Secondary | ICD-10-CM | POA: Diagnosis not present

## 2021-02-10 DIAGNOSIS — G20 Parkinson's disease: Secondary | ICD-10-CM | POA: Diagnosis not present

## 2021-02-10 DIAGNOSIS — I10 Essential (primary) hypertension: Secondary | ICD-10-CM | POA: Diagnosis not present

## 2021-02-10 DIAGNOSIS — M21372 Foot drop, left foot: Secondary | ICD-10-CM | POA: Diagnosis not present

## 2021-02-13 DIAGNOSIS — Z86718 Personal history of other venous thrombosis and embolism: Secondary | ICD-10-CM | POA: Diagnosis not present

## 2021-02-13 DIAGNOSIS — G20 Parkinson's disease: Secondary | ICD-10-CM | POA: Diagnosis not present

## 2021-02-13 DIAGNOSIS — N390 Urinary tract infection, site not specified: Secondary | ICD-10-CM | POA: Diagnosis not present

## 2021-02-13 DIAGNOSIS — I10 Essential (primary) hypertension: Secondary | ICD-10-CM | POA: Diagnosis not present

## 2021-02-13 DIAGNOSIS — G8194 Hemiplegia, unspecified affecting left nondominant side: Secondary | ICD-10-CM | POA: Diagnosis not present

## 2021-02-13 DIAGNOSIS — N401 Enlarged prostate with lower urinary tract symptoms: Secondary | ICD-10-CM | POA: Diagnosis not present

## 2021-02-13 DIAGNOSIS — K219 Gastro-esophageal reflux disease without esophagitis: Secondary | ICD-10-CM | POA: Diagnosis not present

## 2021-02-13 DIAGNOSIS — E782 Mixed hyperlipidemia: Secondary | ICD-10-CM | POA: Diagnosis not present

## 2021-02-13 DIAGNOSIS — K859 Acute pancreatitis without necrosis or infection, unspecified: Secondary | ICD-10-CM | POA: Diagnosis not present

## 2021-02-13 DIAGNOSIS — M6281 Muscle weakness (generalized): Secondary | ICD-10-CM | POA: Diagnosis not present

## 2021-02-13 DIAGNOSIS — R2681 Unsteadiness on feet: Secondary | ICD-10-CM | POA: Diagnosis not present

## 2021-02-13 DIAGNOSIS — M21372 Foot drop, left foot: Secondary | ICD-10-CM | POA: Diagnosis not present

## 2021-02-18 ENCOUNTER — Other Ambulatory Visit: Payer: Self-pay | Admitting: *Deleted

## 2021-02-18 DIAGNOSIS — Z9049 Acquired absence of other specified parts of digestive tract: Secondary | ICD-10-CM | POA: Diagnosis not present

## 2021-02-18 DIAGNOSIS — G20 Parkinson's disease: Secondary | ICD-10-CM | POA: Diagnosis not present

## 2021-02-18 DIAGNOSIS — L89152 Pressure ulcer of sacral region, stage 2: Secondary | ICD-10-CM | POA: Diagnosis not present

## 2021-02-18 DIAGNOSIS — I10 Essential (primary) hypertension: Secondary | ICD-10-CM

## 2021-02-18 DIAGNOSIS — I69354 Hemiplegia and hemiparesis following cerebral infarction affecting left non-dominant side: Secondary | ICD-10-CM | POA: Diagnosis not present

## 2021-02-18 DIAGNOSIS — R4189 Other symptoms and signs involving cognitive functions and awareness: Secondary | ICD-10-CM | POA: Diagnosis not present

## 2021-02-18 DIAGNOSIS — D6859 Other primary thrombophilia: Secondary | ICD-10-CM | POA: Diagnosis not present

## 2021-02-18 DIAGNOSIS — E119 Type 2 diabetes mellitus without complications: Secondary | ICD-10-CM | POA: Diagnosis not present

## 2021-02-18 DIAGNOSIS — K851 Biliary acute pancreatitis without necrosis or infection: Secondary | ICD-10-CM | POA: Diagnosis not present

## 2021-02-18 NOTE — Patient Outreach (Signed)
[place] Coordinator follow up. Member screened for potential [facility] Care Management needs.  Verified in [place] (Patient Robert [name]) that member transitioned to home from [facility] on 02/18/21.  Mr. Mohammad was followed by [facility] prior to admission.   Communication sent to facility SW to inquire about home health arrangements. Will await response from SNF SW.   Will make referral to [facility] RN CM for care coordination. Mr. Teyon has had 3 hospitalizations in the past 6 months. Has medical history of CVA, DVT, Parkinson's, HTN.   Marthenia Rolling, MSN, RN,BSN [place] Acute Care Coordinator [phone] [facility]) [phone]  (Toll free office)

## 2021-02-19 ENCOUNTER — Other Ambulatory Visit: Payer: Self-pay | Admitting: *Deleted

## 2021-02-19 NOTE — Patient Outreach (Incomplete)
[facility]) Care Management  02/19/2021  Masaji Billups 1946-04-24 [id number]   Referral Date: 4/27 Referral Source: Post Acute Care Coordinator Date of Admission: 3/21 (hospital to SNF) Diagnosis: Sepsis Date of Discharge: 4/26 Facility: [place]: Medicare  Outreach attempt #1,  Social:  Conditions:  Medications:  Appointments:  Consent:   Plan: RN CM will

## 2021-02-20 ENCOUNTER — Other Ambulatory Visit: Payer: Self-pay | Admitting: *Deleted

## 2021-02-20 DIAGNOSIS — G8194 Hemiplegia, unspecified affecting left nondominant side: Secondary | ICD-10-CM | POA: Diagnosis not present

## 2021-02-20 DIAGNOSIS — I639 Cerebral infarction, unspecified: Secondary | ICD-10-CM | POA: Diagnosis not present

## 2021-02-20 DIAGNOSIS — N312 Flaccid neuropathic bladder, not elsewhere classified: Secondary | ICD-10-CM | POA: Diagnosis not present

## 2021-02-20 DIAGNOSIS — G20 Parkinson's disease: Secondary | ICD-10-CM | POA: Diagnosis not present

## 2021-02-20 DIAGNOSIS — K219 Gastro-esophageal reflux disease without esophagitis: Secondary | ICD-10-CM | POA: Diagnosis not present

## 2021-02-20 DIAGNOSIS — I10 Essential (primary) hypertension: Secondary | ICD-10-CM | POA: Diagnosis not present

## 2021-02-20 NOTE — Patient Outreach (Signed)
[facility] Post Acute Care Coordinator follow up.   [place] SNF SW confirmed Mr. Koning transitioned home on 02/18/21. SNF SW reports home health services were declined and that Mr. Barge opted to continue with outpatient PT with [facility].  Will update [facility] RNCM.    Marthenia Rolling, MSN, RN,BSN [place] Acute Care Coordinator [phone] [place]) [phone]  (Toll free office)

## 2021-02-21 ENCOUNTER — Encounter: Payer: Self-pay | Admitting: *Deleted

## 2021-02-21 NOTE — Patient Instructions (Signed)
Cholecystostomy, Care After This sheet gives you information about how to care for yourself after your procedure. Your health care provider may also give you more specific instructions. If you have problems or questions, contact your health care provider. What can I expect after the procedure? After your procedure, it is common to have soreness near the incision site of your drainage tube (catheter). Follow these instructions at home: Incision care  Follow instructions from your health care provider about how to take care of your incision site where the catheter was inserted. Make sure you: ? Wash your hands with soap and water before and after you change your bandage (dressing). If soap and water are not available, use hand sanitizer. ? Change your dressing as told by your health care provider.  Check the incision site every day for signs of infection. Check for: ? Redness, swelling, or pain. ? Fluid or blood. ? Warmth. ? Pus or a bad smell.  Do not take baths, swim, or use a hot tub until your health care provider approves. Ask your health care provider if you may take showers. You may only be allowed to take sponge baths.   General instructions  Follow instructions from your health care provider about how to care for your catheter and collection bag at home.  Your health care provider will show you: ? How to record the amount of drainage from the catheter. ? How to flush the catheter. ? How to care for the catheter incision site.  Follow instructions from your health care provider about eating or drinking restrictions.  Take over-the-counter and prescription medicines only as told by your health care provider.  Keep all follow-up visits as told by your health care provider. This is important. Contact a health care provider if:  You have redness, swelling, or pain around the catheter incision site.  You have nausea or vomiting. Get help right away if:  Your abdominal pain  gets worse.  You feel dizzy or you faint while standing.  You have fluid or blood coming from the catheter incision site.  The area around the catheter incision site feels warm to the touch.  You have pus or a bad smell coming from the catheter incision site.  You have a fever.  You have shortness of breath.  You have a rapid heartbeat.  Your nausea or vomiting does not go away.  Your catheter becomes blocked.  Your catheter comes out of your abdomen. Summary  After your procedure, it is common to have soreness near the incision site of your drainage tube (catheter).  Wash your hands with soap and water before and after you change your bandage (dressing). Change your dressing as told by your health care provider.  Check the catheter incision site every day for signs of infection. Check for redness, swelling, pain, fluid, blood, warmth, pus, or a bad smell.  Contact your health care provider if you have nausea or vomiting, or if you have redness, swelling, or pain around your catheter incision site.  Get help right away if your abdominal pain gets worse, you feel dizzy, you have blood or fluid coming from the catheter incision site, you have a fever, or you have shortness of breath. This information is not intended to replace advice given to you by your health care provider. Make sure you discuss any questions you have with your health care provider. Document Revised: 05/09/2018 Document Reviewed: 05/09/2018 Elsevier Patient Education  [place].

## 2021-02-21 NOTE — Patient Outreach (Signed)
[facility]) Care Management  [place]  02/21/2021   Robert [name] [birth year] [id number]    Referral Date: 4/27 Referral Source: Post Acute Care Coordinator Date of Admission: 3/21 (hospital to SNF) Diagnosis: Sepsis Date of Discharge: 4/26 Facility: [place]: Medicare   Outreach attempt #1, successful to wife.  Identity verified.  This care manager introduced self and stated purpose of call.  [facility] care management services explained.  She is familiar with this care manager and [facility] as member has been active over the past 2 years.  Social: Lives with wife, she is sole caregiver for member.  She has expressed the need for additional assistance however they do not qualify for services due to income, unable to pay out of pocket.  Report member has been improving since being in the SNF and home, does not have home health PT as she is taking him to outpatient PT.  Feel he is improving in strength much better with outpatient therapy.  He does have home health nurse visiting the home every 4 weeks to change chronic foley catheter.  Conditions:  Per chart, has history of HTN, CVA with hemiparesis, chronic UTI's, parkinson's, BPH, and gallstone pancreatitis.  Had cholecystectomy while hospitalized.  Wife monitors blood pressure and blood sugar daily, blood pressure today was 116/73.  Medications:  Reviewed with wife, state he is taking all as instructed.  Appointments: Member has already followed up with surgeon, does not have appoint with urology.  State she will contact urology if there is a problem with the foley.  PCP has been contacted, waiting for nurse to call back with follow up appointment date/time.     Encounter Medications:  Outpatient Encounter Medications as of 02/19/2021  Medication Sig Note  . acetaminophen (TYLENOL) 500 MG tablet Take 1,000 mg by mouth every 6 (six) hours as needed for moderate pain.   Marland Kitchen amLODipine (NORVASC) 5 MG tablet  Take 1 tablet (5 mg total) by mouth daily.   . bisacodyl (DULCOLAX) 5 MG EC tablet Take 1 tablet (5 mg total) by mouth daily as needed for moderate constipation.   . busPIRone (BUSPAR) 7.5 MG tablet Take 7.5 mg by mouth 2 (two) times daily.   . Carbidopa-Levodopa ER (SINEMET CR) 25-100 MG tablet controlled release TAKE TWO TABLETS BY MOUTH THREE TIMES DAILY  (Patient taking differently: Take 1-2 tablets by mouth See admin instructions. Takes 2 tablets in the morning, 2 tablets in the afternoon, and 1 tablet at night) 11/10/2020: Had all 3 doses   . lidocaine (LIDODERM) 5 % Place 1 patch onto the skin daily. Remove & Discard patch within 12 hours or as directed by MD   . loratadine (CLARITIN) 10 MG tablet Take 10 mg by mouth daily.   Marland Kitchen losartan (COZAAR) 50 MG tablet Take 50 mg by mouth daily.   Marland Kitchen omeprazole (PRILOSEC) 20 MG capsule Take 20 mg by mouth daily as needed (heartburn).   Debby Freiberg Tartrate (NUPLAZID) 34 MG CAPS Take 34 mg by mouth daily.   . polyethylene glycol (MIRALAX) packet Take 17 g by mouth daily. (Patient taking differently: Take 17 g by mouth daily as needed for mild constipation.)   . rasagiline (AZILECT) 1 MG TABS tablet Take 1 mg by mouth daily.   . Simethicone 250 MG CAPS Take 250 mg by mouth daily as needed (bloating).   . simvastatin (ZOCOR) 20 MG tablet Take 20 mg by mouth every evening.   . tamsulosin (FLOMAX) 0.4 MG  CAPS capsule Take 0.4 mg by mouth daily.   . traZODone (DESYREL) 50 MG tablet Take 0.5 tablets (25 mg total) by mouth at bedtime as needed for sleep.   Marland Kitchen apixaban (ELIQUIS) 5 MG TABS tablet Take 1 tablet (5 mg total) by mouth 2 (two) times daily.    No facility-administered encounter medications on file as of 02/19/2021.    Functional Status:  In your present state of health, do you have any difficulty performing the following activities: 01/05/2021 01/05/2021  Hearing? Tempie Donning  Vision? - Y  Difficulty concentrating or making decisions? - Y  Walking or  climbing stairs? - N  Comment - -  Dressing or bathing? - Y  Doing errands, shopping? - Y  Some recent data might be hidden    Fall/Depression Screening: Fall Risk  02/19/2021 06/18/2020 04/08/2020  Falls in the past year? 0 0 0  Number falls in past yr: 0 0 0  Injury with Fall? 0 0 0  Risk for fall due to : - Impaired balance/gait;Impaired mobility;Impaired vision;Medication side effect Medication side effect;Impaired balance/gait;Impaired mobility;Impaired vision;Mental status change  Follow up - Falls evaluation completed;Education provided;Falls prevention discussed Falls evaluation completed;Education provided;Falls prevention discussed   PHQ 2/9 Scores 02/19/2021 02/20/2020 09/12/2019  Exception Documentation Other- indicate reason in comment box Other- indicate reason in comment box Other- indicate reason in comment box  Not completed - did not speak with patient, spoke with wife Assessment completed by wife    Assessment:  Goals Addressed            This Visit's Progress   . [facility]) Learn More About My Health   On track    Timeframe:  Long-Range Goal Priority:  Medium Start Date:        4/27                     Expected End Date:   6/27                    Barriers: Knowledge    - ask questions - speak up when I don't understand    Why is this important?   The best way to learn about your health and care is by talking to the doctor and nurse.  They will answer your questions and give you information in the way that you like best.    Notes: for wife  4/27 - Confirmed wife's understanding of surgery and sepsis, signs/symptoms of infection    . [facility]) Make and Keep All Appointments   On track    Timeframe:  Short-Term Goal Priority:  High Start Date:           4/27                  Expected End Date:   5/27  Barriers: Other - Potential Caregiver stress.  Patient disabled, wife is only caregiver                       - ask family or friend for a ride - call to  cancel if needed - keep a calendar with appointment dates    Why is this important?   Part of staying healthy is seeing the doctor for follow-up care.  If you forget your appointments, there are some things you can do to stay on track.    Notes:   4/27 - Confirmed member has seen surgeon since having gall  bladder removed.  Wife aware of when to contact MD with changes, will continue PCP follow up    . COMPLETED: [facility]) Track and Manage My Blood Pressure       Follow Up Date 10/24/20    - check blood pressure 3 times per week - choose a place to take my blood pressure (home, clinic or office, retail store) - write blood pressure results in a log or diary -notify provider for sustained elevations or periods of hypotension (SBP <110)    Why is this important?   You won't feel high blood pressure, but it can still hurt your blood vessels.  High blood pressure can cause heart or kidney problems. It can also cause a stroke.  Making lifestyle changes like losing a little weight or eating less salt will help.  Checking your blood pressure at home and at different times of the day can help to control blood pressure.  If the doctor prescribes medicine remember to take it the way the doctor ordered.  Call the office if you cannot afford the medicine or if there are questions about it.     Notes:   4/27 - Completed due to change in focus after hospitalization       Plan:  Follow-up:  Patient agrees to Care Plan and Follow-up.  Will send member/wife education regarding care after cholecystectomy.  Will notify PCP of [facility] involvement.  Will follow up within the next week.  [name], [place], MSN [place] [phone]

## 2021-02-26 ENCOUNTER — Other Ambulatory Visit: Payer: Self-pay | Admitting: *Deleted

## 2021-02-26 NOTE — Patient Outreach (Signed)
[name]) Care Management  02/26/2021  Robert [name] Jun 26, 1946 [id number]   Weekly transition of care call placed to member's wife.  State she does not have much time to talk but report member is doing "excellent right now."  Denies any urgent concerns, encouraged to contact this care manager with questions.  Agrees to follow up within the next week.  Goals Addressed            This Visit's Progress   . [facility]) Learn More About My Health   On track    Timeframe:  Long-Range Goal Priority:  Medium Start Date:        4/27                     Expected End Date:   6/27                    Barriers: Knowledge    - ask questions - speak up when I don't understand    Why is this important?   The best way to learn about your health and care is by talking to the doctor and nurse.  They will answer your questions and give you information in the way that you like best.    Notes: for wife  4/27 - Confirmed wife's understanding of surgery and sepsis, signs/symptoms of infection  5/4 - Encouraged wife to contact this care manager or provider if any concerns arise    . [facility]) Make and Keep All Appointments   On track    Timeframe:  Short-Term Goal Priority:  High Start Date:           4/27                  Expected End Date:   5/27  Barriers: Other - Potential Caregiver stress.  Patient disabled, wife is only caregiver                       - ask family or friend for a ride - call to cancel if needed - keep a calendar with appointment dates    Why is this important?   Part of staying healthy is seeing the doctor for follow-up care.  If you forget your appointments, there are some things you can do to stay on track.    Notes:   4/27 - Confirmed member has seen surgeon since having gall bladder removed.  Wife aware of when to contact MD with changes, will continue PCP follow up  5/4 - PCP appointment scheduled for 5/10      [name], RN, MSN [place] Manager [phone]

## 2021-02-28 DIAGNOSIS — G8194 Hemiplegia, unspecified affecting left nondominant side: Secondary | ICD-10-CM | POA: Diagnosis not present

## 2021-02-28 DIAGNOSIS — K219 Gastro-esophageal reflux disease without esophagitis: Secondary | ICD-10-CM | POA: Diagnosis not present

## 2021-02-28 DIAGNOSIS — N312 Flaccid neuropathic bladder, not elsewhere classified: Secondary | ICD-10-CM | POA: Diagnosis not present

## 2021-02-28 DIAGNOSIS — G20 Parkinson's disease: Secondary | ICD-10-CM | POA: Diagnosis not present

## 2021-02-28 DIAGNOSIS — I639 Cerebral infarction, unspecified: Secondary | ICD-10-CM | POA: Diagnosis not present

## 2021-02-28 DIAGNOSIS — I10 Essential (primary) hypertension: Secondary | ICD-10-CM | POA: Diagnosis not present

## 2021-03-04 DIAGNOSIS — I6930 Unspecified sequelae of cerebral infarction: Secondary | ICD-10-CM | POA: Diagnosis not present

## 2021-03-04 DIAGNOSIS — I10 Essential (primary) hypertension: Secondary | ICD-10-CM | POA: Diagnosis not present

## 2021-03-04 DIAGNOSIS — G8190 Hemiplegia, unspecified affecting unspecified side: Secondary | ICD-10-CM | POA: Diagnosis not present

## 2021-03-04 DIAGNOSIS — K851 Biliary acute pancreatitis without necrosis or infection: Secondary | ICD-10-CM | POA: Diagnosis not present

## 2021-03-04 DIAGNOSIS — Z86718 Personal history of other venous thrombosis and embolism: Secondary | ICD-10-CM | POA: Diagnosis not present

## 2021-03-04 DIAGNOSIS — Z9049 Acquired absence of other specified parts of digestive tract: Secondary | ICD-10-CM | POA: Diagnosis not present

## 2021-03-04 DIAGNOSIS — G20 Parkinson's disease: Secondary | ICD-10-CM | POA: Diagnosis not present

## 2021-03-04 DIAGNOSIS — M6250 Muscle wasting and atrophy, not elsewhere classified, unspecified site: Secondary | ICD-10-CM | POA: Diagnosis not present

## 2021-03-04 DIAGNOSIS — E1169 Type 2 diabetes mellitus with other specified complication: Secondary | ICD-10-CM | POA: Diagnosis not present

## 2021-03-04 DIAGNOSIS — Z09 Encounter for follow-up examination after completed treatment for conditions other than malignant neoplasm: Secondary | ICD-10-CM | POA: Diagnosis not present

## 2021-03-05 ENCOUNTER — Other Ambulatory Visit: Payer: Self-pay | Admitting: *Deleted

## 2021-03-05 NOTE — Patient Outreach (Signed)
[facility]) Care Management  03/05/2021  Robert [name] 30-Sep-1946 [id number]   Weekly transition of care call placed to member's wife, report this is not a good time to talk, request all back.  Will attempt to call back later today, if unable to call back will follow up within the next 3-4 business days.  [name], [place], MSN [place] [phone]

## 2021-03-11 ENCOUNTER — Other Ambulatory Visit: Payer: Self-pay | Admitting: *Deleted

## 2021-03-11 NOTE — Patient Outreach (Signed)
[facility]) Care Management  03/11/2021  Robert [name] 12-05-1945 [id number]   Weekly transition of care call placed to member's wife, state he is "the same, doing well."  Denies any urgent concerns, encouraged to contact this care manager with questions.  Agrees to follow up within the next week.  Goals Addressed            This Visit's Progress   . [facility]) Learn More About My Health   On track    Timeframe:  Long-Range Goal Priority:  Medium Start Date:        4/27                     Expected End Date:   6/27                    Barriers: Knowledge    - ask questions - speak up when I don't understand    Why is this important?   The best way to learn about your health and care is by talking to the doctor and nurse.  They will answer your questions and give you information in the way that you like best.    Notes: for wife  4/27 - Confirmed wife's understanding of surgery and sepsis, signs/symptoms of infection  5/4 - Encouraged wife to contact this care manager or provider if any concerns arise  5/17 - Wife verbalizes understanding of recent gall bladder surgery, denies questions.  No signs/symptoms of infection or complications    . [facility]) Make and Keep All Appointments   On track    Timeframe:  Short-Term Goal Priority:  High Start Date:           4/27                  Expected End Date:   5/27  Barriers: Other - Potential Caregiver stress.  Patient disabled, wife is only caregiver                       - ask family or friend for a ride - call to cancel if needed - keep a calendar with appointment dates    Why is this important?   Part of staying healthy is seeing the doctor for follow-up care.  If you forget your appointments, there are some things you can do to stay on track.    Notes:   4/27 - Confirmed member has seen surgeon since having gall bladder removed.  Wife aware of when to contact MD with changes, will continue PCP follow  up  5/4 - PCP appointment scheduled for 5/10  5/17 - Confirmed member attended PCP appointment, wife denies any concerns.      [name], [place], MSN [place] [phone]

## 2021-03-21 ENCOUNTER — Other Ambulatory Visit: Payer: Self-pay | Admitting: *Deleted

## 2021-03-21 NOTE — Patient Outreach (Signed)
[facility]) Care Management  03/21/2021  Robert [name] Feb 25, 1946 [id number]   Weekly transition of care call placed to wife, state member is doing well. Discussed end of transition of care program, denies questions.  Denies any urgent concerns, encouraged to contact this care manager with questions.  Agrees to follow up within the next month.  Goals Addressed            This Visit's Progress   . [facility]) Learn More About My Health   On track    Timeframe:  Long-Range Goal Priority:  Medium Start Date:        4/27                     Expected End Date:   6/27                    Barriers: Knowledge    - ask questions - speak up when I don't understand    Why is this important?   The best way to learn about your health and care is by talking to the doctor and nurse.  They will answer your questions and give you information in the way that you like best.    Notes: for wife  4/27 - Confirmed wife's understanding of surgery and sepsis, signs/symptoms of infection  5/4 - Encouraged wife to contact this care manager or provider if any concerns arise  5/17 - Wife verbalizes understanding of recent gall bladder surgery, denies questions.  No signs/symptoms of infection or complications  7/82 - Wife is member's caregiver, has additional help when needed to care for member    . [facility]) Make and Keep All Appointments   On track    Timeframe:  Short-Term Goal Priority:  High Start Date:           4/27                  Expected End Date:   5/27  Barriers: Other - Potential Caregiver stress.  Patient disabled, wife is only caregiver                       - ask family or friend for a ride - call to cancel if needed - keep a calendar with appointment dates    Why is this important?   Part of staying healthy is seeing the doctor for follow-up care.  If you forget your appointments, there are some things you can do to stay on track.    Notes:   4/27 -  Confirmed member has seen surgeon since having gall bladder removed.  Wife aware of when to contact MD with changes, will continue PCP follow up  5/4 - PCP appointment scheduled for 5/10  5/17 - Confirmed member attended PCP appointment, wife denies any concerns.  5/27 - All appointments up to date, been out of hospital for 30 days, wife will continue to monitor to decrease risk of readmission.  Will follow up with MD as needed to stay stable      [name], RN, MSN [place] Manager [phone]

## 2021-04-11 DIAGNOSIS — I639 Cerebral infarction, unspecified: Secondary | ICD-10-CM | POA: Diagnosis not present

## 2021-04-11 DIAGNOSIS — K219 Gastro-esophageal reflux disease without esophagitis: Secondary | ICD-10-CM | POA: Diagnosis not present

## 2021-04-11 DIAGNOSIS — G20 Parkinson's disease: Secondary | ICD-10-CM | POA: Diagnosis not present

## 2021-04-11 DIAGNOSIS — G8194 Hemiplegia, unspecified affecting left nondominant side: Secondary | ICD-10-CM | POA: Diagnosis not present

## 2021-04-11 DIAGNOSIS — N312 Flaccid neuropathic bladder, not elsewhere classified: Secondary | ICD-10-CM | POA: Diagnosis not present

## 2021-04-11 DIAGNOSIS — I10 Essential (primary) hypertension: Secondary | ICD-10-CM | POA: Diagnosis not present

## 2021-04-15 DIAGNOSIS — E785 Hyperlipidemia, unspecified: Secondary | ICD-10-CM | POA: Diagnosis not present

## 2021-04-15 DIAGNOSIS — I779 Disorder of arteries and arterioles, unspecified: Secondary | ICD-10-CM | POA: Diagnosis not present

## 2021-04-15 DIAGNOSIS — L723 Sebaceous cyst: Secondary | ICD-10-CM | POA: Diagnosis not present

## 2021-04-15 DIAGNOSIS — M4802 Spinal stenosis, cervical region: Secondary | ICD-10-CM | POA: Diagnosis not present

## 2021-04-18 ENCOUNTER — Other Ambulatory Visit: Payer: Self-pay | Admitting: *Deleted

## 2021-04-18 NOTE — Patient Outreach (Signed)
[facility]) Care Management  04/18/2021  Robert [name] [birth year] [id number]   Outgoing call placed to member's wife, report member is "so-so."  Unable to elaborate on statement as she is currently helping member with PT.  State PCP is handling some things.  Denies any urgent concerns, encouraged to contact this care manager with questions.  Agrees to follow up within the next month.   Goals Addressed             This Visit's Progress    [facility]) Learn More About My Health   On track    Timeframe:  Long-Range Goal Priority:  Medium Start Date:        4/27                     Expected End Date:   6/27                    Barriers: Knowledge    - ask questions - speak up when I don't understand    Why is this important?   The best way to learn about your health and care is by talking to the doctor and nurse.  They will answer your questions and give you information in the way that you like best.    Notes: for wife  4/27 - Confirmed wife's understanding of surgery and sepsis, signs/symptoms of infection  5/4 - Encouraged wife to contact this care manager or provider if any concerns arise  5/17 - Wife verbalizes understanding of recent gall bladder surgery, denies questions.  No signs/symptoms of infection or complications  7/51 - Wife is member's caregiver, has additional help when needed to care for member  6/24 - Per wife, member is now active with PT to help increase strength      [facility]) Make and Keep All Appointments   On track    Timeframe:  Short-Term Goal Priority:  High Start Date:           4/27                  Expected End Date:   5/27  Barriers: Other - Potential Caregiver stress.  Patient disabled, wife is only caregiver                       - ask family or friend for a ride - call to cancel if needed - keep a calendar with appointment dates    Why is this important?   Part of staying healthy is seeing the doctor for follow-up  care.  If you forget your appointments, there are some things you can do to stay on track.    Notes:   4/27 - Confirmed member has seen surgeon since having gall bladder removed.  Wife aware of when to contact MD with changes, will continue PCP follow up  5/4 - PCP appointment scheduled for 5/10  5/17 - Confirmed member attended PCP appointment, wife denies any concerns.  5/27 - All appointments up to date, been out of hospital for 30 days, wife will continue to monitor to decrease risk of readmission.  Will follow up with MD as needed to stay stable  6/24 - No follow ups since last outreach, wife has calendar of upcoming appointments        [name], Therapist, sports, MSN [place] Manager [phone]

## 2021-04-28 DIAGNOSIS — N3000 Acute cystitis without hematuria: Secondary | ICD-10-CM | POA: Diagnosis not present

## 2021-04-29 ENCOUNTER — Other Ambulatory Visit: Payer: Self-pay

## 2021-04-29 ENCOUNTER — Encounter: Payer: Self-pay | Admitting: Vascular Surgery

## 2021-04-29 ENCOUNTER — Ambulatory Visit (INDEPENDENT_AMBULATORY_CARE_PROVIDER_SITE_OTHER): Payer: Medicare Other | Admitting: Vascular Surgery

## 2021-04-29 DIAGNOSIS — I6522 Occlusion and stenosis of left carotid artery: Secondary | ICD-10-CM

## 2021-04-29 DIAGNOSIS — I779 Disorder of arteries and arterioles, unspecified: Secondary | ICD-10-CM | POA: Insufficient documentation

## 2021-04-29 NOTE — Progress Notes (Addendum)
Patient name: Robert [name] MRN: [id number] DOB: 1946/06/20 Sex: male  REASON FOR CONSULT: Evaluate for carotid artery disease  HPI: Robert [name] is a 75 y.o. male, with multiple medical problems including hypertension, hyperlipidemia, leg DVT, previous CVA and left-sided hemiparesis in 2008 that presents for evaluation of carotid artery disease.  Patient is here with his wife who provides most of the history.  She states that he was having evaluation of a sebaceous cyst in the neck and ultimately was found to have carotid disease.  Ultimately he was sent for CTA neck and CTA head at the [facility] by PCP Dr. Marjo Bicker.  The CTA neck on 04/08/2021 showed occlusion of the right cervical ICA that extended into the cavernous segment then reconstituted distally and then 67% stenosis of the proximal left ICA per report.  CT head also showed an old right MCA territory infarct.  He is in a wheelchair today but his wife states he is able to stand and walk limited distances.  He is able to transfer.  He has fairly profound left upper extremity weakness and 4 out of 5 strength in the left leg.  He remains on Eliquis for his previous DVT/stroke per the wife. No recent changes in his neurologic status.  Denies previous neck radiation.  Past Medical History:  Diagnosis Date   BPH (benign prostatic hyperplasia)    CVA (cerebral vascular accident) ([place])    with left sided hemiparesis   Diverticulosis    Frequency of urination    GERD (gastroesophageal reflux disease)    Gross hematuria    History of acute pyelonephritis    10-13-2012   History of CVA with residual deficit    2008--  left side of body weakness and foot drop (wears leg brace and uses cane)   History of DVT of lower extremity    2008--  cva   Hypercholesteremia    Hyperlipidemia    Hyperlipidemia    Hypertension    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 2008   Left leg DVT (HCC)    S/P insertion of IVC (inferior vena caval) filter    2008    Urethral stricture    Urgency of urination    Urinary retention    Weakness of left side of body    secondary to cva 2008   Wears glasses    Wears hearing aid    bilateral-- wears intermittantly    Past Surgical History:  Procedure Laterality Date   CYSTOSCOPY WITH RETROGRADE URETHROGRAM N/A 10/23/2015   Procedure: CYSTOSCOPY WITH RETROGRADE URETHROGRAM;  Surgeon: [name], MD;  Location: [facility];  Service: Urology;  Laterality: N/A;   CYSTOSCOPY WITH URETHRAL DILATATION N/A 10/23/2015   Procedure: CYSTOSCOPY WITH URETHRAL BALLOON DILATATION;  Surgeon: [name], MD;  Location: [facility];  Service: Urology;  Laterality: N/A;  BALLOON DILATION    IVC FILTER PLACEMENT ([facility])  2008   LAPAROSCOPIC CHOLECYSTECTOMY SINGLE PORT N/A 01/09/2021   Procedure: LAPAROSCOPIC CHOLECYSTECTOMY WITH IOC;  Surgeon: [name], MD;  Location: [facility];  Service: General;  Laterality: N/A;  12 MIN    Family History  Problem Relation Age of Onset   Diabetes Sister    Lung cancer Brother        Post 9/11 voluntary work in [employer]   Prostate cancer Brother    Other Mother        passed away young- non medical   Alzheimer's disease  Father    Healthy Son     SOCIAL HISTORY: Social History   Socioeconomic History   Marital status: Married    Spouse name: [name]   Number of children: 1   Years of education: 12   Highest education level: High school graduate  Occupational History   Occupation: retired    Comment: Public librarian  Tobacco Use   Smoking status: Former    Years: 10.00    Pack years: 0.00    Types: Cigarettes    Quit date: 10/26/1970    Years since quitting: 50.5   Smokeless tobacco: Never  Vaping Use   Vaping Use: Never used  Substance and Sexual Activity   Alcohol use: No   Drug use: No   Sexual activity: Not on file  Other Topics Concern   Not on file  Social History Narrative   Lives in [place] with wife. He is a English as a second language teacher.    Has one son who lives in [place]. He is healthy.   [facility] [place] [place]      Patient is right-handed. He lives with his wife in a one level home.   Social Determinants of Health   Financial Resource Strain: Not on file  Food Insecurity: Food Insecurity Present   Worried About [place] in the Last Year: Sometimes true   Ran Out of Food in the Last Year: Sometimes true  Transportation Needs: No Transportation Needs   Lack of Transportation (Medical): No   Lack of Transportation (Non-Medical): No  Physical Activity: Not on file  Stress: Not on file  Social Connections: Not on file  Intimate Partner Violence: Not on file    Allergies  Allergen Reactions   Propofol Other (See Comments)    "hiccups for weeks"   Lisinopril     Other reaction(s): Cough    Current Outpatient Medications  Medication Sig Dispense Refill   acetaminophen (TYLENOL) 500 MG tablet Take 1,000 mg by mouth every 6 (six) hours as needed for moderate pain.     amLODipine (NORVASC) 5 MG tablet Take 1 tablet (5 mg total) by mouth daily.     apixaban (ELIQUIS) 5 MG TABS tablet Take 1 tablet (5 mg total) by mouth 2 (two) times daily. 60 tablet 0   bisacodyl (DULCOLAX) 5 MG EC tablet Take 1 tablet (5 mg total) by mouth daily as needed for moderate constipation. 30 tablet 0   busPIRone (BUSPAR) 7.5 MG tablet Take 7.5 mg by mouth 2 (two) times daily.     Carbidopa-Levodopa ER (SINEMET CR) 25-100 MG tablet controlled release TAKE TWO TABLETS BY MOUTH THREE TIMES DAILY  (Patient taking differently: Take 1-2 tablets by mouth See admin instructions. Takes 2 tablets in the morning, 2 tablets in the afternoon, and 1 tablet at night) 180 tablet 3   lidocaine (LIDODERM) 5 % Place 1 patch onto the skin daily. Remove & Discard patch within 12 hours or as directed by MD 14 patch 0   loratadine (CLARITIN) 10 MG tablet Take 10 mg by mouth daily.     losartan (COZAAR) 50 MG tablet Take 50 mg  by mouth daily.     omeprazole (PRILOSEC) 20 MG capsule Take 20 mg by mouth daily as needed (heartburn).     Pimavanserin Tartrate (NUPLAZID) 34 MG CAPS Take 34 mg by mouth daily.     polyethylene glycol (MIRALAX) packet Take 17 g by mouth daily. (Patient taking differently: Take 17 g by  mouth daily as needed for mild constipation.) 14 each 0   rasagiline (AZILECT) 1 MG TABS tablet Take 1 mg by mouth daily.     Simethicone 250 MG CAPS Take 250 mg by mouth daily as needed (bloating).     simvastatin (ZOCOR) 20 MG tablet Take 20 mg by mouth every evening.     tamsulosin (FLOMAX) 0.4 MG CAPS capsule Take 0.4 mg by mouth daily.     traZODone (DESYREL) 50 MG tablet Take 0.5 tablets (25 mg total) by mouth at bedtime as needed for sleep.     No current facility-administered medications for this visit.    REVIEW OF SYSTEMS:  [X]  denotes positive finding, [ ]  denotes negative finding Cardiac  Comments:  Chest pain or chest pressure:    Shortness of breath upon exertion:    Short of breath when lying flat:    Irregular heart rhythm:        Vascular    Pain in calf, thigh, or hip brought on by ambulation:    Pain in feet at night that wakes you up from your sleep:     Blood clot in your veins:    Leg swelling:         Pulmonary    Oxygen at home:    Productive cough:     Wheezing:         Neurologic    Sudden weakness in arms or legs:     Sudden numbness in arms or legs:     Sudden onset of difficulty speaking or slurred speech:    Temporary loss of vision in one eye:     Problems with dizziness:         Gastrointestinal    Blood in stool:     Vomited blood:         Genitourinary    Burning when urinating:     Blood in urine:        Psychiatric    Major depression:         Hematologic    Bleeding problems:    Problems with blood clotting too easily:        Skin    Rashes or ulcers:        Constitutional    Fever or chills:      PHYSICAL EXAM: Vitals:   04/29/21  1047  BP: 130/76  Pulse: 96  Resp: 18  Temp: 97.8 F (36.6 C)  TempSrc: Temporal  SpO2: 97%  Weight: 230 lb (104.3 kg)  Height: 6\' 2"  (1.88 m)    GENERAL: The patient is a well-nourished male, in no acute distress. The vital signs are documented above. CARDIAC: There is a regular rate and rhythm.  VASCULAR:  Palpable radial pulses bilateral upper extremities PULMONARY: No respiratory distress. ABDOMEN: Soft and non-tender. MUSCULOSKELETAL: There are no major deformities or cyanosis. NEUROLOGIC: Left upper extremity 0 out of 5.  Left lower extremity 4 out of 5 strength.  Right upper and lower extremity appears 5 out of 5 strength. SKIN: There are no ulcers or rashes noted. PSYCHIATRIC: The patient has a normal affect.     DATA:   CTA neck from 04/08/2021 reviewed and the right ICA is occluded from the origin up to the skull base.  The left ICA has a high-grade greater than 80% stenosis by my review as pictured above.  Assessment/Plan:  75 year old male that presents for evaluation of carotid artery disease.  I discussed with him and his  wife that his right ICA is occluded and I suspect this is from his stroke in 2008 where he has an old right MCA territory infarct and residual left sided weakness.  Discussed there would be no role for revascularization of this chronically occluded ICA on the right.  I do think he has a greater than 80% stenosis of the left ICA and have recommended a left carotid intervention.  Given the contralateral occlusion, I think TCAR would be his best option for overall stroke risk reduction.  I discussed risk and benefits of surgery with the patient including 1% risk of perioperative stroke as well as risk of bleeding, risk of anesthesia, etc.  I have asked that he start an 81 mg aspirin and also Plavix.  I offered to send the plavix prescription to his pharmacy.  The wife has asked that I send a note to his PCP, Dr. Marjo Bicker at the [facility] to have the Plavix  prescription started at the [facility] given her concern for cost.  We will get him scheduled today for left TCAR.   [name], MD [facility] Office: [phone]

## 2021-04-30 ENCOUNTER — Other Ambulatory Visit: Payer: Self-pay

## 2021-05-05 MED ORDER — CLOPIDOGREL BISULFATE 75 MG PO TABS: 75.0000 mg | ORAL_TABLET | Freq: Every day | ORAL | 5 refills | Status: DC

## 2021-05-05 NOTE — Addendum Note (Signed)
Addended by: Nicholas Lose on: 05/05/2021 09:41 AM   Modules accepted: Orders

## 2021-05-07 NOTE — Progress Notes (Signed)
Surgical Instructions    Your procedure is scheduled on 05/12/21.  Report to [facility] Main Entrance "A" at 07:30 A.M., then check in with the Admitting office.  Call this number if you have problems the morning of surgery:  [phone]   If you have any questions prior to your surgery date call [phone]: Open Monday-Friday 8am-4pm    Remember:  Do not eat or drink after midnight the night before your surgery      Take these medicines the morning of surgery with A SIP OF WATER  acetaminophen (TYLENOL) if needed busPIRone (BUSPAR)  Carbidopa-Levodopa ER (SINEMET CR) hydrOXYzine (ATARAX/VISTARIL) if needed loratadine (CLARITIN) Pimavanserin Tartrate (NUPLAZID) tamsulosin (FLOMAX)  Aspirin Clopidogrel(plavix)  As of today, STOP taking any  (unless otherwise instructed by your surgeon) Aleve, Naproxen, Ibuprofen, Motrin, Advil, Goody's, BC's, all herbal medications, fish oil, and all vitamins.  Stop taking apixaban (ELIQUIS) 3 days prior to surgery. Your last dose will be 05/08/2021. Continue taking aspirin and clopidogrel (PLAVIX) through the day of surgery.   WHAT DO I DO ABOUT MY DIABETES MEDICATION?   Do not take oral diabetes medicines (pills) the morning of surgery.   THE MORNING OF SURGERY, do not take metFORMIN (GLUCOPHAGE) .   If your CBG is greater than 220 mg/dL, you may take  of your sliding scale (correction) dose of insulin.   HOW TO MANAGE YOUR DIABETES BEFORE AND AFTER SURGERY  Why is it important to control my blood sugar before and after surgery? Improving blood sugar levels before and after surgery helps healing and can limit problems. A way of improving blood sugar control is eating a healthy diet by:  Eating less sugar and carbohydrates  Increasing activity/exercise  Talking with your doctor about reaching your blood sugar goals High blood sugars (greater than 180 mg/dL) can raise your risk of infections and slow your recovery, so you will  need to focus on controlling your diabetes during the weeks before surgery. Make sure that the doctor who takes care of your diabetes knows about your planned surgery including the date and location.  How do I manage my blood sugar before surgery? Check your blood sugar at least 4 times a day, starting 2 days before surgery, to make sure that the level is not too high or low.  Check your blood sugar the morning of your surgery when you wake up and every 2 hours until you get to the Short Stay unit.  If your blood sugar is less than 70 mg/dL, you will need to treat for low blood sugar: Do not take insulin. Treat a low blood sugar (less than 70 mg/dL) with  cup of clear juice (cranberry or apple), 4 glucose tablets, OR glucose gel. Recheck blood sugar in 15 minutes after treatment (to make sure it is greater than 70 mg/dL). If your blood sugar is not greater than 70 mg/dL on recheck, call [phone] for further instructions. Report your blood sugar to the short stay nurse when you get to Short Stay.  If you are admitted to the hospital after surgery: Your blood sugar will be checked by the staff and you will probably be given insulin after surgery (instead of oral diabetes medicines) to make sure you have good blood sugar levels. The goal for blood sugar control after surgery is 80-180 mg/dL.   Do not wear jewelry or makeup Do not wear lotions, powders, perfumes/colognes, or deodorant. Do not shave 48 hours prior to surgery.  Men may shave face  and neck. Do not bring valuables to the hospital.  DO Not wear nail polish, gel polish, artificial nails, or any other type of covering on natural nails  including finger and toenails. If patients have artificial nails, gel coating, etc. that need to be removed by a nail salon please have this removed prior to surgery or surgery may need to be canceled/delayed if the surgeon/ anesthesia feels like the patient is unable to be adequately  monitored.             [facility] is not responsible for any belongings or valuables.  Do NOT Smoke (Tobacco/Vaping) or drink Alcohol 24 hours prior to your procedure If you use a CPAP at night, you may bring all equipment for your overnight stay.   Contacts, glasses, dentures or bridgework may not be worn into surgery, please bring cases for these belongings   For patients admitted to the hospital, discharge time will be determined by your treatment team.   Patients discharged the day of surgery will not be allowed to drive home, and someone needs to stay with them for 24 hours.  ONLY 1 SUPPORT PERSON MAY BE PRESENT WHILE YOU ARE IN SURGERY. IF YOU ARE TO BE ADMITTED ONCE YOU ARE IN YOUR ROOM YOU WILL BE ALLOWED TWO (2) VISITORS.  Minor children may have two parents present. Special consideration for safety and communication needs will be reviewed on a case by case basis.  Special instructions:    Oral Hygiene is also important to reduce your risk of infection.  Remember - BRUSH YOUR TEETH THE MORNING OF SURGERY WITH YOUR REGULAR TOOTHPASTE   [facility]- Preparing For Surgery  Before surgery, you can play an important role. Because skin is not sterile, your skin needs to be as free of germs as possible. You can reduce the number of germs on your skin by washing with CHG (chlorahexidine gluconate) Soap before surgery.  CHG is an antiseptic cleaner which kills germs and bonds with the skin to continue killing germs even after washing.     Please do not use if you have an allergy to CHG or antibacterial soaps. If your skin becomes reddened/irritated stop using the CHG.  Do not shave (including legs and underarms) for at least 48 hours prior to first CHG shower. It is OK to shave your face.  Please follow these instructions carefully.     Shower the NIGHT BEFORE SURGERY and the MORNING OF SURGERY with CHG Soap.   If you chose to wash your hair, wash your hair first as usual with your  normal shampoo. After you shampoo, rinse your hair and body thoroughly to remove the shampoo.  Then [employer] and genitals (private parts) with your normal soap and rinse thoroughly to remove soap.  After that Use CHG Soap as you would any other liquid soap. You can apply CHG directly to the skin and wash gently with a scrungie or a clean washcloth.   Apply the CHG Soap to your body ONLY FROM THE NECK DOWN.  Do not use on open wounds or open sores. Avoid contact with your eyes, ears, mouth and genitals (private parts). Wash Face and genitals (private parts)  with your normal soap.   Wash thoroughly, paying special attention to the area where your surgery will be performed.  Thoroughly rinse your body with warm water from the neck down.  DO NOT shower/wash with your normal soap after using and rinsing off the CHG Soap.  Fraser Din  yourself dry with a CLEAN TOWEL.  Wear CLEAN PAJAMAS to bed the night before surgery  Place CLEAN SHEETS on your bed the night before your surgery  DO NOT SLEEP WITH PETS.   Day of Surgery: Take a shower with CHG soap. Wear Clean/Comfortable clothing the morning of surgery Do not apply any deodorants/lotions.   Remember to brush your teeth WITH YOUR REGULAR TOOTHPASTE.   Please read over the following fact sheets that you were given.

## 2021-05-08 ENCOUNTER — Other Ambulatory Visit (HOSPITAL_COMMUNITY): Payer: Medicare Other

## 2021-05-08 ENCOUNTER — Encounter (HOSPITAL_COMMUNITY)
Admission: RE | Admit: 2021-05-08 | Discharge: 2021-05-08 | Disposition: A | Discharge status: Home / Self Care | Payer: Medicare Other | Source: Ambulatory Visit | Attending: Family Medicine | Admitting: Family Medicine

## 2021-05-08 ENCOUNTER — Encounter (HOSPITAL_COMMUNITY): Payer: Self-pay

## 2021-05-08 ENCOUNTER — Other Ambulatory Visit: Payer: Self-pay

## 2021-05-08 DIAGNOSIS — Z20822 Contact with and (suspected) exposure to covid-19: Secondary | ICD-10-CM | POA: Diagnosis not present

## 2021-05-08 DIAGNOSIS — Z01812 Encounter for preprocedural laboratory examination: Secondary | ICD-10-CM | POA: Insufficient documentation

## 2021-05-08 HISTORY — DX: Anemia, unspecified: D64.9

## 2021-05-08 HISTORY — DX: Type 2 diabetes mellitus without complications: E11.9

## 2021-05-08 HISTORY — DX: Myoneural disorder, unspecified: G70.9

## 2021-05-08 LAB — URINALYSIS, ROUTINE W REFLEX MICROSCOPIC
Bilirubin Urine: NEGATIVE
Glucose, UA: NEGATIVE mg/dL
Ketones, ur: NEGATIVE mg/dL
Nitrite: NEGATIVE
Protein, ur: NEGATIVE mg/dL
Specific Gravity, Urine: >1.03 — ABNORMAL HIGH (ref 1.005–1.030)
pH: 6 (ref 5.0–8.0)

## 2021-05-08 LAB — COMPREHENSIVE METABOLIC PANEL
ALT: 9 U/L (ref 0–44)
AST: 15 U/L (ref 15–41)
Albumin: 3.6 g/dL (ref 3.5–5.0)
Alkaline Phosphatase: 72 U/L (ref 38–126)
Anion gap: 9 (ref 5–15)
BUN: 13 mg/dL (ref 8–23)
CO2: 24 mmol/L (ref 22–32)
Calcium: 9.3 mg/dL (ref 8.9–10.3)
Chloride: 104 mmol/L (ref 98–111)
Creatinine, Ser: 1.1 mg/dL (ref 0.61–1.24)
GFR, Estimated: >60 mL/min (ref >60)
Glucose, Bld: 146 mg/dL — ABNORMAL HIGH (ref 70–99)
Potassium: 4 mmol/L (ref 3.5–5.1)
Sodium: 137 mmol/L (ref 135–145)
Total Bilirubin: 0.6 mg/dL (ref 0.3–1.2)
Total Protein: 7.5 g/dL (ref 6.5–8.1)

## 2021-05-08 LAB — CBC
HCT: 47.7 % (ref 39.0–52.0)
Hemoglobin: 15 g/dL (ref 13.0–17.0)
MCH: 28.6 pg (ref 26.0–34.0)
MCHC: 31.4 g/dL (ref 30.0–36.0)
MCV: 90.9 fL (ref 80.0–100.0)
Platelets: 322 10*3/uL (ref 150–400)
RBC: 5.25 MIL/uL (ref 4.22–5.81)
RDW: 13.8 % (ref 11.5–15.5)
WBC: 6.7 10*3/uL (ref 4.0–10.5)
nRBC: 0 % (ref 0.0–0.2)

## 2021-05-08 LAB — URINALYSIS, MICROSCOPIC (REFLEX): WBC, UA: >50 WBC/hpf (ref 0–5)

## 2021-05-08 LAB — TYPE AND SCREEN
ABO/RH(D): O POS
Antibody Screen: NEGATIVE

## 2021-05-08 LAB — SURGICAL PCR SCREEN
MRSA, PCR: POSITIVE — AB
Staphylococcus aureus: POSITIVE — AB

## 2021-05-08 LAB — SARS CORONAVIRUS 2 (TAT 6-24 HRS): SARS Coronavirus 2: NEGATIVE

## 2021-05-08 LAB — APTT: aPTT: 33 seconds (ref 24–36)

## 2021-05-08 LAB — GLUCOSE, CAPILLARY: Glucose-Capillary: 165 mg/dL — ABNORMAL HIGH (ref 70–99)

## 2021-05-08 LAB — PROTIME-INR
INR: 1.2 (ref 0.8–1.2)
Prothrombin Time: 15.5 seconds — ABNORMAL HIGH (ref 11.4–15.2)

## 2021-05-08 NOTE — Progress Notes (Signed)
Notified Karoline Caldwell PA for anesthesia that pt is taking MAOI- Rasagiline. Per James,pt may take this medication the morning of surgery,but anesthesia must be aware that pt is taking it . Chart will be sent for anesthesia review.

## 2021-05-08 NOTE — Progress Notes (Signed)
PCP - Larae Grooms Cardiologist - [facility]- Dr. Tarry Kos Neurologist - [facility]- Dr. Patrice Paradise PPM/ICD - denies Device Orders -  Rep Notified -   Chest x-ray -  EKG - 01/07/21 Stress Test -  ECHO - 01/08/21 Cardiac Cath -   Sleep Study - no CPAP -   Fasting Blood Sugar - 110-120 Checks Blood Sugar  time a day  Blood Thinner Instructions:stop Eliquis 3 days prior to surgery. Last dose 7/14 Aspirin Instructions:continue Aspirin and Plavix  ERAS Protcol -no PRE-SURGERY Ensure or G2-   COVID TEST- 05/08/21   Anesthesia review: yes- history of stroke;taking MAOI,PT 15.5  Patient denies shortness of breath, fever, cough and chest pain at PAT appointment   All instructions explained to the patient, with a verbal understanding of the material. Patient agrees to go over the instructions while at home for a better understanding. Patient also instructed to self quarantine after being tested for COVID-19. The opportunity to ask questions was provided.

## 2021-05-08 NOTE — Progress Notes (Signed)
Notified scheduler at Dr. Ainsley Spinner office that pt 's PCR was positive for MRSA.

## 2021-05-08 NOTE — Progress Notes (Signed)
Surgical Instructions    Your procedure is scheduled on 05/12/21.  Report to [facility] Main Entrance "A" at 07:30 A.M., then check in with the Admitting office.  Call this number if you have problems the morning of surgery:  [phone]   If you have any questions prior to your surgery date call [phone]: Open Monday-Friday 8am-4pm    Remember:  Do not eat or drink after midnight the night before your surgery      Take these medicines the morning of surgery with A SIP OF WATER  acetaminophen (TYLENOL) if needed busPIRone (BUSPAR)  Carbidopa-Levodopa ER (SINEMET CR) hydrOXYzine (ATARAX/VISTARIL) if needed loratadine (CLARITIN) Pimavanserin Tartrate (NUPLAZID) tamsulosin (FLOMAX)  Aspirin Clopidogrel(plavix) rasagiline (AZILECT)     As of today, STOP taking any  (unless otherwise instructed by your surgeon) Aleve, Naproxen, Ibuprofen, Motrin, Advil, Goody's, BC's, all herbal medications, fish oil, and all vitamins.  Stop taking apixaban (ELIQUIS) 3 days prior to surgery. Your last dose will be 05/08/2021. Continue taking aspirin and clopidogrel (PLAVIX) through the day of surgery.   WHAT DO I DO ABOUT MY DIABETES MEDICATION?   Do not take oral diabetes medicines (pills) the morning of surgery.   THE MORNING OF SURGERY, do not take metFORMIN (GLUCOPHAGE) .   If your CBG is greater than 220 mg/dL, you may take  of your sliding scale (correction) dose of insulin.   HOW TO MANAGE YOUR DIABETES BEFORE AND AFTER SURGERY  Why is it important to control my blood sugar before and after surgery? Improving blood sugar levels before and after surgery helps healing and can limit problems. A way of improving blood sugar control is eating a healthy diet by:  Eating less sugar and carbohydrates  Increasing activity/exercise  Talking with your doctor about reaching your blood sugar goals High blood sugars (greater than 180 mg/dL) can raise your risk of infections and slow  your recovery, so you will need to focus on controlling your diabetes during the weeks before surgery. Make sure that the doctor who takes care of your diabetes knows about your planned surgery including the date and location.  How do I manage my blood sugar before surgery? Check your blood sugar at least 4 times a day, starting 2 days before surgery, to make sure that the level is not too high or low.  Check your blood sugar the morning of your surgery when you wake up and every 2 hours until you get to the Short Stay unit.  If your blood sugar is less than 70 mg/dL, you will need to treat for low blood sugar: Do not take insulin. Treat a low blood sugar (less than 70 mg/dL) with  cup of clear juice (cranberry or apple), 4 glucose tablets, OR glucose gel. Recheck blood sugar in 15 minutes after treatment (to make sure it is greater than 70 mg/dL). If your blood sugar is not greater than 70 mg/dL on recheck, call [phone] for further instructions. Report your blood sugar to the short stay nurse when you get to Short Stay.  If you are admitted to the hospital after surgery: Your blood sugar will be checked by the staff and you will probably be given insulin after surgery (instead of oral diabetes medicines) to make sure you have good blood sugar levels. The goal for blood sugar control after surgery is 80-180 mg/dL.   Do not wear jewelry or makeup Do not wear lotions, powders, perfumes/colognes, or deodorant. Do not shave 48 hours prior to surgery.  Men may shave face and neck. Do not bring valuables to the hospital.  DO Not wear nail polish, gel polish, artificial nails, or any other type of covering on natural nails  including finger and toenails. If patients have artificial nails, gel coating, etc. that need to be removed by a nail salon please have this removed prior to surgery or surgery may need to be canceled/delayed if the surgeon/ anesthesia feels like the patient is unable to  be adequately monitored.             [facility] is not responsible for any belongings or valuables.  Do NOT Smoke (Tobacco/Vaping) or drink Alcohol 24 hours prior to your procedure If you use a CPAP at night, you may bring all equipment for your overnight stay.   Contacts, glasses, dentures or bridgework may not be worn into surgery, please bring cases for these belongings   For patients admitted to the hospital, discharge time will be determined by your treatment team.   Patients discharged the day of surgery will not be allowed to drive home, and someone needs to stay with them for 24 hours.  ONLY 1 SUPPORT PERSON MAY BE PRESENT WHILE YOU ARE IN SURGERY. IF YOU ARE TO BE ADMITTED ONCE YOU ARE IN YOUR ROOM YOU WILL BE ALLOWED TWO (2) VISITORS.  Minor children may have two parents present. Special consideration for safety and communication needs will be reviewed on a case by case basis.  Special instructions:    Oral Hygiene is also important to reduce your risk of infection.  Remember - BRUSH YOUR TEETH THE MORNING OF SURGERY WITH YOUR REGULAR TOOTHPASTE   [facility]- Preparing For Surgery  Before surgery, you can play an important role. Because skin is not sterile, your skin needs to be as free of germs as possible. You can reduce the number of germs on your skin by washing with CHG (chlorahexidine gluconate) Soap before surgery.  CHG is an antiseptic cleaner which kills germs and bonds with the skin to continue killing germs even after washing.     Please do not use if you have an allergy to CHG or antibacterial soaps. If your skin becomes reddened/irritated stop using the CHG.  Do not shave (including legs and underarms) for at least 48 hours prior to first CHG shower. It is OK to shave your face.  Please follow these instructions carefully.     Shower the NIGHT BEFORE SURGERY and the MORNING OF SURGERY with CHG Soap.   If you chose to wash your hair, wash your hair first as  usual with your normal shampoo. After you shampoo, rinse your hair and body thoroughly to remove the shampoo.  Then [employer] and genitals (private parts) with your normal soap and rinse thoroughly to remove soap.  After that Use CHG Soap as you would any other liquid soap. You can apply CHG directly to the skin and wash gently with a scrungie or a clean washcloth.   Apply the CHG Soap to your body ONLY FROM THE NECK DOWN.  Do not use on open wounds or open sores. Avoid contact with your eyes, ears, mouth and genitals (private parts). Wash Face and genitals (private parts)  with your normal soap.   Wash thoroughly, paying special attention to the area where your surgery will be performed.  Thoroughly rinse your body with warm water from the neck down.  DO NOT shower/wash with your normal soap after using and rinsing off the  CHG Soap.  Pat yourself dry with a CLEAN TOWEL.  Wear CLEAN PAJAMAS to bed the night before surgery  Place CLEAN SHEETS on your bed the night before your surgery  DO NOT SLEEP WITH PETS.   Day of Surgery: Take a shower with CHG soap. Wear Clean/Comfortable clothing the morning of surgery Do not apply any deodorants/lotions.   Remember to brush your teeth WITH YOUR REGULAR TOOTHPASTE.   Please read over the following fact sheets that you were given.

## 2021-05-09 LAB — HEMOGLOBIN A1C
Hgb A1c MFr Bld: 6.4 % — ABNORMAL HIGH (ref 4.8–5.6)
Mean Plasma Glucose: 136.98 mg/dL

## 2021-05-09 NOTE — Progress Notes (Signed)
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  Recent admission March 2022 for gallstone pancreatitis.  During admission he was seen by cardiology for preop evaluation prior to undergoing laparoscopic cholecystectomy.  Per cardiology note 01/08/2021, " Surgical clearance for laparoscopic cholecystectomy with cholangiogram Patient was doing physical therapy for 1 hour 2 times per week without cardiac symptoms. Despite limited ambulation he was able to get at least 4 METS of activity.  History unreliable due to Parkinson disease.  EKG without acute ischemic changes. RCRI 6.6%" Echo was ordered to assess LVEF.  Echo 01/08/2021 showed EF 55 to 60%, normal wall motion, moderate concentric LVH, grade 1 DD, no significant valvular abnormalities.  He is maintained on selective MAO B inhibitor rasagiline for management of Parkinson's disease.  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  The left ICA has a high-grade greater than 80%.  Pt reports LD Eliquis 7/14.  Preop labs reviewed, unremarkable.  DM2 well-controlled, A1c 6.4.  EKG 01/07/21: NSR. Rate 81.  PORTABLE CHEST 1 VIEW 01/04/21: COMPARISON:  11/27/2020   FINDINGS: The heart size and mediastinal contours are within normal limits. Both lungs are clear. The visualized skeletal structures are unremarkable.   IMPRESSION: No active disease.   TTE 01/08/2021:  1. Left ventricular ejection fraction, by estimation, is 55 to 60%. The  left ventricle has normal function. The left ventricle has no regional  wall motion abnormalities. There is moderate concentric left ventricular  hypertrophy. Left ventricular  diastolic parameters are consistent with Grade I diastolic dysfunction   (impaired relaxation).   2. Right ventricular systolic function is normal. The right ventricular  size is normal.   3. The mitral valve is grossly normal. Trivial mitral valve  regurgitation. No evidence of mitral stenosis.   4. The aortic valve is tricuspid. Aortic valve regurgitation is not  visualized. No aortic stenosis is present.    Wynonia Musty [facility] Short Stay Center/Anesthesiology Phone [phone] 05/09/2021 10:04 AM

## 2021-05-09 NOTE — Progress Notes (Signed)
Pt's wife is patient's only caregiver early in morning. Pt is dependent on her for his ADL's. Wife stated ;that it would be very difficult for her to give patient second CHG shower in the morning and get him to the hospital by 0730.  Discussed situation with AD, Rolla Flatten RN, and per Pam it will be okay for Pt to receive 6 cloth CHG bath in am on arrival to short stay.

## 2021-05-09 NOTE — Anesthesia Preprocedure Evaluation (Addendum)
Anesthesia Evaluation  Patient identified by MRN, date of birth, ID band Patient awake    Reviewed: Allergy & Precautions, H&P , NPO status , Patient's Chart, lab work & pertinent test results  Airway Mallampati: II   Neck ROM: full    Dental   Pulmonary former smoker,    breath sounds clear to auscultation       Cardiovascular hypertension, + Peripheral Vascular Disease   Rhythm:regular Rate:Normal     Neuro/Psych Parkinson's dz  Neuromuscular disease CVA, Residual Symptoms    GI/Hepatic GERD  ,  Endo/Other  diabetes, Type 2  Renal/GU      Musculoskeletal   Abdominal   Peds  Hematology   Anesthesia Other Findings   Reproductive/Obstetrics                            Anesthesia Physical Anesthesia Plan  ASA: 3  Anesthesia Plan: General   Post-op Pain Management:    Induction: Intravenous  PONV Risk Score and Plan: 2 and Ondansetron, Dexamethasone and Treatment may vary due to age or medical condition  Airway Management Planned: Oral ETT  Additional Equipment: Arterial line  Intra-op Plan:   Post-operative Plan: Extubation in OR  Informed Consent: I have reviewed the patients History and Physical, chart, labs and discussed the procedure including the risks, benefits and alternatives for the proposed anesthesia with the patient or authorized representative who has indicated his/her understanding and acceptance.     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  Recent admission March 2022 for gallstone pancreatitis.  During admission he was seen by  cardiology for preop evaluation prior to undergoing laparoscopic cholecystectomy.  Per cardiology note 01/08/2021, "Surgical clearance for laparoscopic cholecystectomy with cholangiogram Patient was doing physical therapy for 1 hour 2 times per week without cardiac symptoms. Despite limited ambulation he was able to get at least 4 METS of activity. History unreliable due to Parkinson disease.EKG without acute ischemic changes.RCRI 6.6%" Echo was ordered to assess LVEF.  Echo 01/08/2021 showed EF 55 to 60%, normal wall motion, moderate concentric LVH, grade 1 DD, no significant valvular abnormalities.  He is maintained on selective MAO B inhibitor rasagiline for management of Parkinson's disease.  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 The left ICA has a high-grade greater than 80%.  Pt reports LD Eliquis 7/14.  Preop labs reviewed, unremarkable.  DM2 well-controlled, A1c 6.4.  EKG 01/07/21: NSR. Rate 81.  PORTABLE CHEST 1 VIEW 01/04/21: COMPARISON: 11/27/2020  FINDINGS: The heart size and mediastinal contours are within normal limits. Both lungs are clear. The visualized skeletal structures are unremarkable.  IMPRESSION: No active disease.  TTE 01/08/2021: 1. Left ventricular ejection fraction, by estimation, is 55 to 60%. The  left ventricle has normal function. The left ventricle has no regional  wall motion abnormalities. There is moderate concentric left ventricular  hypertrophy. Left ventricular  diastolic parameters are consistent with Grade I diastolic dysfunction  (impaired relaxation).  2. Right ventricular systolic function is normal. The right ventricular  size is normal.  3. The mitral valve is grossly normal. Trivial mitral valve  regurgitation. No  evidence of mitral stenosis.  4. The aortic valve is tricuspid. Aortic valve regurgitation is not  visualized. No aortic stenosis is present.  )        Anesthesia Quick Evaluation

## 2021-05-12 ENCOUNTER — Inpatient Hospital Stay (HOSPITAL_COMMUNITY): Payer: Medicare Other

## 2021-05-12 ENCOUNTER — Encounter (HOSPITAL_COMMUNITY)
Admission: RE | Disposition: A | Discharge status: Rehabilitation Facility | Payer: Self-pay | Source: Home / Self Care | Attending: Vascular Surgery

## 2021-05-12 ENCOUNTER — Inpatient Hospital Stay (HOSPITAL_COMMUNITY): Payer: Medicare Other | Admitting: Physician Assistant

## 2021-05-12 ENCOUNTER — Inpatient Hospital Stay (HOSPITAL_COMMUNITY)
Admission: RE | Admit: 2021-05-12 | Discharge: 2021-05-15 | DRG: 034 | Disposition: A | Discharge status: Rehabilitation Facility | Payer: Medicare Other | Attending: Vascular Surgery | Admitting: Vascular Surgery

## 2021-05-12 DIAGNOSIS — R252 Cramp and spasm: Secondary | ICD-10-CM | POA: Diagnosis not present

## 2021-05-12 DIAGNOSIS — I63512 Cerebral infarction due to unspecified occlusion or stenosis of left middle cerebral artery: Secondary | ICD-10-CM

## 2021-05-12 DIAGNOSIS — Z95828 Presence of other vascular implants and grafts: Secondary | ICD-10-CM

## 2021-05-12 DIAGNOSIS — I6522 Occlusion and stenosis of left carotid artery: Secondary | ICD-10-CM | POA: Diagnosis present

## 2021-05-12 DIAGNOSIS — L600 Ingrowing nail: Secondary | ICD-10-CM | POA: Diagnosis present

## 2021-05-12 DIAGNOSIS — Z8042 Family history of malignant neoplasm of prostate: Secondary | ICD-10-CM | POA: Diagnosis not present

## 2021-05-12 DIAGNOSIS — R338 Other retention of urine: Secondary | ICD-10-CM | POA: Diagnosis present

## 2021-05-12 DIAGNOSIS — Z888 Allergy status to other drugs, medicaments and biological substances status: Secondary | ICD-10-CM | POA: Diagnosis not present

## 2021-05-12 DIAGNOSIS — I69320 Aphasia following cerebral infarction: Secondary | ICD-10-CM | POA: Diagnosis not present

## 2021-05-12 DIAGNOSIS — R4701 Aphasia: Secondary | ICD-10-CM | POA: Diagnosis not present

## 2021-05-12 DIAGNOSIS — I10 Essential (primary) hypertension: Secondary | ICD-10-CM | POA: Diagnosis present

## 2021-05-12 DIAGNOSIS — D649 Anemia, unspecified: Secondary | ICD-10-CM | POA: Diagnosis present

## 2021-05-12 DIAGNOSIS — H6123 Impacted cerumen, bilateral: Secondary | ICD-10-CM | POA: Diagnosis present

## 2021-05-12 DIAGNOSIS — E1165 Type 2 diabetes mellitus with hyperglycemia: Secondary | ICD-10-CM | POA: Diagnosis not present

## 2021-05-12 DIAGNOSIS — I69354 Hemiplegia and hemiparesis following cerebral infarction affecting left non-dominant side: Secondary | ICD-10-CM

## 2021-05-12 DIAGNOSIS — N401 Enlarged prostate with lower urinary tract symptoms: Secondary | ICD-10-CM | POA: Diagnosis present

## 2021-05-12 DIAGNOSIS — I69398 Other sequelae of cerebral infarction: Secondary | ICD-10-CM | POA: Diagnosis not present

## 2021-05-12 DIAGNOSIS — E669 Obesity, unspecified: Secondary | ICD-10-CM | POA: Diagnosis present

## 2021-05-12 DIAGNOSIS — G8110 Spastic hemiplegia affecting unspecified side: Secondary | ICD-10-CM | POA: Diagnosis not present

## 2021-05-12 DIAGNOSIS — Z87891 Personal history of nicotine dependence: Secondary | ICD-10-CM

## 2021-05-12 DIAGNOSIS — Z801 Family history of malignant neoplasm of trachea, bronchus and lung: Secondary | ICD-10-CM | POA: Diagnosis not present

## 2021-05-12 DIAGNOSIS — Z833 Family history of diabetes mellitus: Secondary | ICD-10-CM

## 2021-05-12 DIAGNOSIS — E7800 Pure hypercholesterolemia, unspecified: Secondary | ICD-10-CM | POA: Diagnosis present

## 2021-05-12 DIAGNOSIS — R29898 Other symptoms and signs involving the musculoskeletal system: Secondary | ICD-10-CM | POA: Diagnosis not present

## 2021-05-12 DIAGNOSIS — G20 Parkinson's disease: Secondary | ICD-10-CM | POA: Diagnosis present

## 2021-05-12 DIAGNOSIS — Z974 Presence of external hearing-aid: Secondary | ICD-10-CM

## 2021-05-12 DIAGNOSIS — I63412 Cerebral infarction due to embolism of left middle cerebral artery: Secondary | ICD-10-CM | POA: Diagnosis not present

## 2021-05-12 DIAGNOSIS — K219 Gastro-esophageal reflux disease without esophagitis: Secondary | ICD-10-CM | POA: Diagnosis present

## 2021-05-12 DIAGNOSIS — I6521 Occlusion and stenosis of right carotid artery: Secondary | ICD-10-CM | POA: Diagnosis not present

## 2021-05-12 DIAGNOSIS — Z86718 Personal history of other venous thrombosis and embolism: Secondary | ICD-10-CM | POA: Diagnosis not present

## 2021-05-12 DIAGNOSIS — E785 Hyperlipidemia, unspecified: Secondary | ICD-10-CM | POA: Diagnosis not present

## 2021-05-12 DIAGNOSIS — I951 Orthostatic hypotension: Secondary | ICD-10-CM | POA: Diagnosis not present

## 2021-05-12 DIAGNOSIS — F0280 Dementia in other diseases classified elsewhere without behavioral disturbance: Secondary | ICD-10-CM | POA: Diagnosis present

## 2021-05-12 DIAGNOSIS — L723 Sebaceous cyst: Secondary | ICD-10-CM | POA: Diagnosis present

## 2021-05-12 DIAGNOSIS — I69351 Hemiplegia and hemiparesis following cerebral infarction affecting right dominant side: Secondary | ICD-10-CM | POA: Diagnosis not present

## 2021-05-12 DIAGNOSIS — I639 Cerebral infarction, unspecified: Secondary | ICD-10-CM | POA: Diagnosis not present

## 2021-05-12 DIAGNOSIS — Z8673 Personal history of transient ischemic attack (TIA), and cerebral infarction without residual deficits: Secondary | ICD-10-CM | POA: Diagnosis not present

## 2021-05-12 DIAGNOSIS — I63131 Cerebral infarction due to embolism of right carotid artery: Secondary | ICD-10-CM | POA: Diagnosis not present

## 2021-05-12 DIAGNOSIS — Z6829 Body mass index (BMI) 29.0-29.9, adult: Secondary | ICD-10-CM

## 2021-05-12 DIAGNOSIS — E1151 Type 2 diabetes mellitus with diabetic peripheral angiopathy without gangrene: Secondary | ICD-10-CM | POA: Diagnosis present

## 2021-05-12 DIAGNOSIS — R1312 Dysphagia, oropharyngeal phase: Secondary | ICD-10-CM | POA: Diagnosis not present

## 2021-05-12 DIAGNOSIS — Z79899 Other long term (current) drug therapy: Secondary | ICD-10-CM

## 2021-05-12 DIAGNOSIS — R531 Weakness: Secondary | ICD-10-CM | POA: Diagnosis not present

## 2021-05-12 DIAGNOSIS — K068 Other specified disorders of gingiva and edentulous alveolar ridge: Secondary | ICD-10-CM | POA: Diagnosis present

## 2021-05-12 DIAGNOSIS — R059 Cough, unspecified: Secondary | ICD-10-CM | POA: Diagnosis not present

## 2021-05-12 DIAGNOSIS — R269 Unspecified abnormalities of gait and mobility: Secondary | ICD-10-CM | POA: Diagnosis not present

## 2021-05-12 DIAGNOSIS — R1310 Dysphagia, unspecified: Secondary | ICD-10-CM | POA: Diagnosis present

## 2021-05-12 DIAGNOSIS — I6523 Occlusion and stenosis of bilateral carotid arteries: Secondary | ICD-10-CM | POA: Diagnosis not present

## 2021-05-12 DIAGNOSIS — L309 Dermatitis, unspecified: Secondary | ICD-10-CM | POA: Diagnosis not present

## 2021-05-12 DIAGNOSIS — R29719 NIHSS score 19: Secondary | ICD-10-CM | POA: Diagnosis not present

## 2021-05-12 DIAGNOSIS — I6602 Occlusion and stenosis of left middle cerebral artery: Secondary | ICD-10-CM | POA: Diagnosis not present

## 2021-05-12 DIAGNOSIS — Z820 Family history of epilepsy and other diseases of the nervous system: Secondary | ICD-10-CM

## 2021-05-12 DIAGNOSIS — G8191 Hemiplegia, unspecified affecting right dominant side: Secondary | ICD-10-CM | POA: Diagnosis not present

## 2021-05-12 DIAGNOSIS — E875 Hyperkalemia: Secondary | ICD-10-CM | POA: Diagnosis not present

## 2021-05-12 DIAGNOSIS — R339 Retention of urine, unspecified: Secondary | ICD-10-CM | POA: Diagnosis present

## 2021-05-12 DIAGNOSIS — I69391 Dysphagia following cerebral infarction: Secondary | ICD-10-CM | POA: Diagnosis not present

## 2021-05-12 DIAGNOSIS — Z7901 Long term (current) use of anticoagulants: Secondary | ICD-10-CM

## 2021-05-12 DIAGNOSIS — G8111 Spastic hemiplegia affecting right dominant side: Secondary | ICD-10-CM | POA: Diagnosis not present

## 2021-05-12 DIAGNOSIS — Z7984 Long term (current) use of oral hypoglycemic drugs: Secondary | ICD-10-CM

## 2021-05-12 DIAGNOSIS — I6389 Other cerebral infarction: Secondary | ICD-10-CM | POA: Diagnosis not present

## 2021-05-12 DIAGNOSIS — I63312 Cerebral infarction due to thrombosis of left middle cerebral artery: Secondary | ICD-10-CM

## 2021-05-12 HISTORY — PX: TRANSCAROTID ARTERY REVASCULARIZATIONÂ: SHX6778

## 2021-05-12 LAB — GLUCOSE, CAPILLARY
Glucose-Capillary: 121 mg/dL — ABNORMAL HIGH (ref 70–99)
Glucose-Capillary: 144 mg/dL — ABNORMAL HIGH (ref 70–99)
Glucose-Capillary: 212 mg/dL — ABNORMAL HIGH (ref 70–99)
Glucose-Capillary: 195 mg/dL — ABNORMAL HIGH (ref 70–99)

## 2021-05-12 LAB — POCT ACTIVATED CLOTTING TIME: Activated Clotting Time: 271 seconds

## 2021-05-12 LAB — ABO/RH: ABO/RH(D): O POS

## 2021-05-12 SURGERY — TRANSCAROTID ARTERY REVASCULARIZATION (TCAR)
Anesthesia: General | Laterality: Left

## 2021-05-12 MED ORDER — BISACODYL 10 MG RE SUPP: 10.0000 mg | Freq: Every day | RECTAL | Status: DC | PRN

## 2021-05-12 MED ORDER — 0.9 % SODIUM CHLORIDE (POUR BTL) OPTIME
TOPICAL | Status: DC | PRN
  Administered 2021-05-12: 1000 mL

## 2021-05-12 MED ORDER — PANTOPRAZOLE SODIUM 40 MG PO TBEC
40.0000 mg | DELAYED_RELEASE_TABLET | Freq: Every day | ORAL | Status: DC
  Administered 2021-05-12 – 2021-05-15 (×4): 40 mg via ORAL
  Filled 2021-05-12 (×4): qty 1

## 2021-05-12 MED ORDER — RASAGILINE MESYLATE 1 MG PO TABS
1.0000 mg | ORAL_TABLET | Freq: Every day | ORAL | Status: DC
  Administered 2021-05-13 – 2021-05-15 (×3): 1 mg via ORAL
  Filled 2021-05-12 (×3): qty 1

## 2021-05-12 MED ORDER — SIMVASTATIN 20 MG PO TABS
20.0000 mg | ORAL_TABLET | Freq: Every evening | ORAL | Status: DC
  Administered 2021-05-12 – 2021-05-14 (×3): 20 mg via ORAL
  Filled 2021-05-12 (×3): qty 1

## 2021-05-12 MED ORDER — TAMSULOSIN HCL 0.4 MG PO CAPS
0.4000 mg | ORAL_CAPSULE | Freq: Every day | ORAL | Status: DC
  Administered 2021-05-12 – 2021-05-15 (×4): 0.4 mg via ORAL
  Filled 2021-05-12 (×4): qty 1

## 2021-05-12 MED ORDER — HYDROXYZINE HCL 10 MG PO TABS
10.0000 mg | ORAL_TABLET | Freq: Every evening | ORAL | Status: DC | PRN
  Filled 2021-05-12: qty 1

## 2021-05-12 MED ORDER — ONDANSETRON HCL 4 MG/2ML IJ SOLN: 4.0000 mg | Freq: Four times a day (QID) | INTRAMUSCULAR | Status: DC | PRN

## 2021-05-12 MED ORDER — STROKE: EARLY STAGES OF RECOVERY BOOK
Freq: Once | Status: AC
  Filled 2021-05-12: qty 1

## 2021-05-12 MED ORDER — PHENOL 1.4 % MT LIQD: 1.0000 | OROMUCOSAL | Status: DC | PRN

## 2021-05-12 MED ORDER — IODIXANOL 320 MG/ML IV SOLN
INTRAVENOUS | Status: DC | PRN
  Administered 2021-05-12: 25 mL via INTRA_ARTERIAL

## 2021-05-12 MED ORDER — DEXAMETHASONE SODIUM PHOSPHATE 10 MG/ML IJ SOLN
INTRAMUSCULAR | Status: DC | PRN
  Administered 2021-05-12: 10 mg via INTRAVENOUS

## 2021-05-12 MED ORDER — SODIUM CHLORIDE 0.9 % IV SOLN: 500.0000 mL | Freq: Once | INTRAVENOUS | Status: DC | PRN

## 2021-05-12 MED ORDER — SUGAMMADEX SODIUM 200 MG/2ML IV SOLN
INTRAVENOUS | Status: DC | PRN
  Administered 2021-05-12: 200 mg via INTRAVENOUS

## 2021-05-12 MED ORDER — FENTANYL CITRATE (PF) 250 MCG/5ML IJ SOLN
INTRAMUSCULAR | Status: DC | PRN
  Administered 2021-05-12 (×3): 25 ug via INTRAVENOUS
  Administered 2021-05-12: 100 ug via INTRAVENOUS

## 2021-05-12 MED ORDER — INSULIN ASPART 100 UNIT/ML IJ SOLN
0.0000 [IU] | Freq: Three times a day (TID) | INTRAMUSCULAR | Status: DC
  Administered 2021-05-12: 2 [IU] via SUBCUTANEOUS
  Administered 2021-05-13: 3 [IU] via SUBCUTANEOUS
  Administered 2021-05-13 – 2021-05-14 (×3): 2 [IU] via SUBCUTANEOUS
  Administered 2021-05-14: 3 [IU] via SUBCUTANEOUS
  Administered 2021-05-15: 2 [IU] via SUBCUTANEOUS
  Administered 2021-05-15: 3 [IU] via SUBCUTANEOUS

## 2021-05-12 MED ORDER — CEFAZOLIN SODIUM-DEXTROSE 2-4 GM/100ML-% IV SOLN
INTRAVENOUS | Status: AC
  Filled 2021-05-12: qty 100

## 2021-05-12 MED ORDER — LIDOCAINE 2% (20 MG/ML) 5 ML SYRINGE
INTRAMUSCULAR | Status: DC | PRN
  Administered 2021-05-12: 60 mg via INTRAVENOUS

## 2021-05-12 MED ORDER — CARBIDOPA-LEVODOPA ER 25-100 MG PO TBCR
2.5000 | EXTENDED_RELEASE_TABLET | Freq: Two times a day (BID) | ORAL | Status: DC
  Administered 2021-05-13 – 2021-05-15 (×6): 2.5 via ORAL
  Filled 2021-05-12 (×7): qty 2.5

## 2021-05-12 MED ORDER — DOCUSATE SODIUM 100 MG PO CAPS
100.0000 mg | ORAL_CAPSULE | Freq: Every day | ORAL | Status: DC
  Administered 2021-05-13 – 2021-05-15 (×3): 100 mg via ORAL
  Filled 2021-05-12 (×3): qty 1

## 2021-05-12 MED ORDER — ASPIRIN EC 81 MG PO TBEC
81.0000 mg | DELAYED_RELEASE_TABLET | Freq: Every day | ORAL | Status: DC
  Administered 2021-05-13 – 2021-05-15 (×3): 81 mg via ORAL
  Filled 2021-05-12 (×3): qty 1

## 2021-05-12 MED ORDER — ONDANSETRON HCL 4 MG/2ML IJ SOLN
INTRAMUSCULAR | Status: DC | PRN
  Administered 2021-05-12: 4 mg via INTRAVENOUS

## 2021-05-12 MED ORDER — CEFAZOLIN SODIUM-DEXTROSE 2-4 GM/100ML-% IV SOLN
2.0000 g | Freq: Three times a day (TID) | INTRAVENOUS | Status: AC
  Administered 2021-05-12 – 2021-05-13 (×2): 2 g via INTRAVENOUS
  Filled 2021-05-12 (×2): qty 100

## 2021-05-12 MED ORDER — GUAIFENESIN-DM 100-10 MG/5ML PO SYRP: 15.0000 mL | ORAL_SOLUTION | ORAL | Status: DC | PRN

## 2021-05-12 MED ORDER — CLOPIDOGREL BISULFATE 75 MG PO TABS
75.0000 mg | ORAL_TABLET | Freq: Every day | ORAL | Status: DC
  Administered 2021-05-13 – 2021-05-15 (×3): 75 mg via ORAL
  Filled 2021-05-12 (×3): qty 1

## 2021-05-12 MED ORDER — ORAL CARE MOUTH RINSE: 15.0000 mL | Freq: Once | OROMUCOSAL | Status: AC

## 2021-05-12 MED ORDER — ACETAMINOPHEN 650 MG RE SUPP: 325.0000 mg | RECTAL | Status: DC | PRN

## 2021-05-12 MED ORDER — LACTATED RINGERS IV SOLN
INTRAVENOUS | Status: DC | PRN
Start: 2021-05-12 — End: 2021-05-12

## 2021-05-12 MED ORDER — LIDOCAINE 5 % EX PTCH: 1.0000 | MEDICATED_PATCH | Freq: Every day | CUTANEOUS | Status: DC | PRN

## 2021-05-12 MED ORDER — CHLORHEXIDINE GLUCONATE CLOTH 2 % EX PADS: 6.0000 | MEDICATED_PAD | Freq: Once | CUTANEOUS | Status: DC

## 2021-05-12 MED ORDER — METOPROLOL TARTRATE 5 MG/5ML IV SOLN: 2.0000 mg | INTRAVENOUS | Status: DC | PRN

## 2021-05-12 MED ORDER — CHLORHEXIDINE GLUCONATE 0.12 % MT SOLN
OROMUCOSAL | Status: AC
  Administered 2021-05-12: 15 mL via OROMUCOSAL
  Filled 2021-05-12: qty 15

## 2021-05-12 MED ORDER — HEPARIN 6000 UNIT IRRIGATION SOLUTION
Status: AC
  Filled 2021-05-12: qty 500

## 2021-05-12 MED ORDER — PROPOFOL 10 MG/ML IV BOLUS
INTRAVENOUS | Status: AC
  Filled 2021-05-12: qty 20

## 2021-05-12 MED ORDER — OXYCODONE HCL 5 MG/5ML PO SOLN: 5.0000 mg | Freq: Once | ORAL | Status: DC | PRN

## 2021-05-12 MED ORDER — HYDRALAZINE HCL 20 MG/ML IJ SOLN
5.0000 mg | INTRAMUSCULAR | Status: DC | PRN
  Administered 2021-05-12: 5 mg via INTRAVENOUS
  Filled 2021-05-12: qty 1

## 2021-05-12 MED ORDER — GLYCOPYRROLATE PF 0.2 MG/ML IJ SOSY
PREFILLED_SYRINGE | INTRAMUSCULAR | Status: DC | PRN
  Administered 2021-05-12: .2 mg via INTRAVENOUS

## 2021-05-12 MED ORDER — HEPARIN 6000 UNIT IRRIGATION SOLUTION
Status: DC | PRN
  Administered 2021-05-12: 1

## 2021-05-12 MED ORDER — ROCURONIUM BROMIDE 10 MG/ML (PF) SYRINGE
PREFILLED_SYRINGE | INTRAVENOUS | Status: DC | PRN
  Administered 2021-05-12: 20 mg via INTRAVENOUS
  Administered 2021-05-12: 60 mg via INTRAVENOUS

## 2021-05-12 MED ORDER — ETOMIDATE 2 MG/ML IV SOLN
INTRAVENOUS | Status: AC
  Filled 2021-05-12: qty 10

## 2021-05-12 MED ORDER — CEFAZOLIN SODIUM-DEXTROSE 2-4 GM/100ML-% IV SOLN
2.0000 g | INTRAVENOUS | Status: AC
  Administered 2021-05-12: 2 g via INTRAVENOUS

## 2021-05-12 MED ORDER — MAGNESIUM SULFATE 2 GM/50ML IV SOLN
2.0000 g | Freq: Every day | INTRAVENOUS | Status: DC | PRN
Start: 2021-05-12 — End: 2021-05-15

## 2021-05-12 MED ORDER — CHLORHEXIDINE GLUCONATE 0.12 % MT SOLN: 15.0000 mL | Freq: Once | OROMUCOSAL | Status: AC

## 2021-05-12 MED ORDER — ACETAMINOPHEN 325 MG PO TABS: 325.0000 mg | ORAL_TABLET | ORAL | Status: DC | PRN

## 2021-05-12 MED ORDER — SODIUM CHLORIDE 0.9 % IV SOLN: INTRAVENOUS | Status: DC

## 2021-05-12 MED ORDER — LORATADINE 10 MG PO TABS: 10.0000 mg | ORAL_TABLET | Freq: Every day | ORAL | Status: DC | PRN

## 2021-05-12 MED ORDER — MEMANTINE HCL 10 MG PO TABS
20.0000 mg | ORAL_TABLET | Freq: Every day | ORAL | Status: DC
  Administered 2021-05-13 – 2021-05-14 (×2): 20 mg via ORAL
  Filled 2021-05-12 (×2): qty 2

## 2021-05-12 MED ORDER — LIDOCAINE HCL (PF) 1 % IJ SOLN
INTRAMUSCULAR | Status: AC
  Filled 2021-05-12: qty 30

## 2021-05-12 MED ORDER — PHENYLEPHRINE HCL-NACL 10-0.9 MG/250ML-% IV SOLN
INTRAVENOUS | Status: DC | PRN
  Administered 2021-05-12: 20 ug/min via INTRAVENOUS

## 2021-05-12 MED ORDER — CARBIDOPA-LEVODOPA ER 25-100 MG PO TBCR
1.0000 | EXTENDED_RELEASE_TABLET | ORAL | Status: DC
  Administered 2021-05-12 – 2021-05-14 (×3): 1 via ORAL
  Filled 2021-05-12 (×4): qty 1

## 2021-05-12 MED ORDER — LACTATED RINGERS IV SOLN: INTRAVENOUS | Status: DC

## 2021-05-12 MED ORDER — ALUM & MAG HYDROXIDE-SIMETH 200-200-20 MG/5ML PO SUSP: 15.0000 mL | ORAL | Status: DC | PRN

## 2021-05-12 MED ORDER — MORPHINE SULFATE (PF) 2 MG/ML IV SOLN
2.0000 mg | INTRAVENOUS | Status: DC | PRN
Start: 2021-05-12 — End: 2021-05-15

## 2021-05-12 MED ORDER — POLYETHYLENE GLYCOL 3350 17 G PO PACK
17.0000 g | PACK | Freq: Every day | ORAL | Status: DC | PRN
  Administered 2021-05-14: 17 g via ORAL
  Filled 2021-05-12: qty 1

## 2021-05-12 MED ORDER — FENTANYL CITRATE (PF) 250 MCG/5ML IJ SOLN
INTRAMUSCULAR | Status: AC
  Filled 2021-05-12: qty 5

## 2021-05-12 MED ORDER — LABETALOL HCL 5 MG/ML IV SOLN
10.0000 mg | INTRAVENOUS | Status: DC | PRN
  Administered 2021-05-12: 10 mg via INTRAVENOUS
  Filled 2021-05-12: qty 4

## 2021-05-12 MED ORDER — POTASSIUM CHLORIDE CRYS ER 20 MEQ PO TBCR: 20.0000 meq | EXTENDED_RELEASE_TABLET | Freq: Every day | ORAL | Status: DC | PRN

## 2021-05-12 MED ORDER — CHLORHEXIDINE GLUCONATE CLOTH 2 % EX PADS
6.0000 | MEDICATED_PAD | Freq: Every day | CUTANEOUS | Status: DC
  Administered 2021-05-12 – 2021-05-15 (×4): 6 via TOPICAL

## 2021-05-12 MED ORDER — PROTAMINE SULFATE 10 MG/ML IV SOLN
INTRAVENOUS | Status: DC | PRN
  Administered 2021-05-12: 50 mg via INTRAVENOUS

## 2021-05-12 MED ORDER — ETOMIDATE 2 MG/ML IV SOLN
INTRAVENOUS | Status: DC | PRN
  Administered 2021-05-12: 20 mg via INTRAVENOUS

## 2021-05-12 MED ORDER — FENTANYL CITRATE (PF) 100 MCG/2ML IJ SOLN: 25.0000 ug | INTRAMUSCULAR | Status: DC | PRN

## 2021-05-12 MED ORDER — OXYCODONE HCL 5 MG PO TABS: 5.0000 mg | ORAL_TABLET | Freq: Once | ORAL | Status: DC | PRN

## 2021-05-12 MED ORDER — HEMOSTATIC AGENTS (NO CHARGE) OPTIME
TOPICAL | Status: DC | PRN
  Administered 2021-05-12: 1 via TOPICAL

## 2021-05-12 MED ORDER — PIMAVANSERIN TARTRATE 34 MG PO CAPS
34.0000 mg | ORAL_CAPSULE | Freq: Every day | ORAL | Status: DC
  Administered 2021-05-13 – 2021-05-15 (×3): 34 mg via ORAL
  Filled 2021-05-12 (×4): qty 1

## 2021-05-12 MED ORDER — HEPARIN SODIUM (PORCINE) 1000 UNIT/ML IJ SOLN
INTRAMUSCULAR | Status: DC | PRN
  Administered 2021-05-12: 10000 [IU] via INTRAVENOUS

## 2021-05-12 MED ORDER — IOHEXOL 350 MG/ML SOLN
75.0000 mL | Freq: Once | INTRAVENOUS | Status: AC | PRN
  Administered 2021-05-12: 75 mL via INTRAVENOUS

## 2021-05-12 MED ORDER — EPHEDRINE SULFATE-NACL 50-0.9 MG/10ML-% IV SOSY
PREFILLED_SYRINGE | INTRAVENOUS | Status: DC | PRN
Start: 2021-05-12 — End: 2021-05-12
  Administered 2021-05-12 (×3): 5 mg via INTRAVENOUS

## 2021-05-12 MED ORDER — BUSPIRONE HCL 5 MG PO TABS
7.5000 mg | ORAL_TABLET | Freq: Two times a day (BID) | ORAL | Status: DC
  Administered 2021-05-13 – 2021-05-15 (×5): 7.5 mg via ORAL
  Filled 2021-05-12 (×5): qty 2

## 2021-05-12 MED ORDER — OXYCODONE-ACETAMINOPHEN 5-325 MG PO TABS: 1.0000 | ORAL_TABLET | ORAL | Status: DC | PRN

## 2021-05-12 SURGICAL SUPPLY — 58 items
ADH SKN CLS APL DERMABOND .7 (GAUZE/BANDAGES/DRESSINGS) ×3
BAG BANDED W/RUBBER/TAPE 36X54 (MISCELLANEOUS) ×2 IMPLANT
BAG COUNTER SPONGE SURGICOUNT (BAG) ×2 IMPLANT
BAG EQP BAND 135X91 W/RBR TAPE (MISCELLANEOUS) ×1
BAG SPNG CNTER NS LX DISP (BAG) ×1
BALLN STERLING RX 6X30X80 (BALLOONS) ×2
BALLOON STERLING RX 6X30X80 (BALLOONS) ×1 IMPLANT
CANISTER SUCT 3000ML PPV (MISCELLANEOUS) ×2 IMPLANT
CATH ROBINSON RED A/P 18FR (CATHETERS) IMPLANT
CLIP VESOCCLUDE MED 6/CT (CLIP) ×2 IMPLANT
CLIP VESOCCLUDE SM WIDE 6/CT (CLIP) ×2 IMPLANT
COVER DOME SNAP 22 D (MISCELLANEOUS) ×2 IMPLANT
COVER PROBE W GEL 5X96 (DRAPES) ×4 IMPLANT
DERMABOND ADVANCED (GAUZE/BANDAGES/DRESSINGS) ×3
DERMABOND ADVANCED .7 DNX12 (GAUZE/BANDAGES/DRESSINGS) ×3 IMPLANT
DRAPE FEMORAL ANGIO 80X135IN (DRAPES) ×2 IMPLANT
ELECT REM PT RETURN 9FT ADLT (ELECTROSURGICAL) ×2
ELECTRODE REM PT RTRN 9FT ADLT (ELECTROSURGICAL) ×1 IMPLANT
GLOVE SURG ENC MOIS LTX SZ7.5 (GLOVE) ×2 IMPLANT
GOWN STRL REUS W/ TWL LRG LVL3 (GOWN DISPOSABLE) ×2 IMPLANT
GOWN STRL REUS W/ TWL XL LVL3 (GOWN DISPOSABLE) ×1 IMPLANT
GOWN STRL REUS W/TWL LRG LVL3 (GOWN DISPOSABLE) ×4
GOWN STRL REUS W/TWL XL LVL3 (GOWN DISPOSABLE) ×2
GUIDEWIRE BENTSON (WIRE) ×2 IMPLANT
HEMOSTAT SNOW SURGICEL 2X4 (HEMOSTASIS) ×2 IMPLANT
INTRODUCER KIT GALT 7CM (INTRODUCER) ×2
KIT BASIN OR (CUSTOM PROCEDURE TRAY) ×2 IMPLANT
KIT ENCORE 26 ADVANTAGE (KITS) ×2 IMPLANT
KIT INTRODUCER GALT 7 (INTRODUCER) ×1 IMPLANT
KIT TURNOVER KIT B (KITS) ×2 IMPLANT
NEEDLE HYPO 25GX1X1/2 BEV (NEEDLE) IMPLANT
PACK CAROTID (CUSTOM PROCEDURE TRAY) ×2 IMPLANT
POSITIONER HEAD DONUT 9IN (MISCELLANEOUS) ×2 IMPLANT
PROTECTION STATION PRESSURIZED (MISCELLANEOUS) ×2
SET MICROPUNCTURE 5F STIFF (MISCELLANEOUS) ×2 IMPLANT
SHEATH PINNACLE 5FR (SHEATH) IMPLANT
SHUNT CAROTID BYPASS 10 (VASCULAR PRODUCTS) IMPLANT
SHUNT CAROTID BYPASS 12FRX15.5 (VASCULAR PRODUCTS) IMPLANT
STATION PROTECTION PRESSURIZED (MISCELLANEOUS) ×1 IMPLANT
STENT TRANSCAROTID SYSTEM 8X40 (Permanent Stent) ×2 IMPLANT
SUT MNCRL AB 4-0 PS2 18 (SUTURE) ×2 IMPLANT
SUT PROLENE 5 0 C 1 24 (SUTURE) ×2 IMPLANT
SUT PROLENE 6 0 BV (SUTURE) IMPLANT
SUT PROLENE 7 0 BV 1 (SUTURE) IMPLANT
SUT SILK 2 0 PERMA HAND 18 BK (SUTURE) IMPLANT
SUT SILK 2 0 SH CR/8 (SUTURE) ×2 IMPLANT
SUT SILK 3 0 (SUTURE)
SUT SILK 3-0 18XBRD TIE 12 (SUTURE) IMPLANT
SUT VIC AB 3-0 SH 27 (SUTURE) ×2
SUT VIC AB 3-0 SH 27X BRD (SUTURE) ×1 IMPLANT
SYR 10ML LL (SYRINGE) ×6 IMPLANT
SYR 20ML LL LF (SYRINGE) ×2 IMPLANT
SYR CONTROL 10ML LL (SYRINGE) IMPLANT
SYSTEM TRANSCAROTID NEUROPRTCT (MISCELLANEOUS) ×1 IMPLANT
TOWEL GREEN STERILE (TOWEL DISPOSABLE) ×2 IMPLANT
TRANSCAROTID NEUROPROTECT SYS (MISCELLANEOUS) ×2
WATER STERILE IRR 1000ML POUR (IV SOLUTION) ×2 IMPLANT
WIRE BENTSON .035X145CM (WIRE) ×2 IMPLANT

## 2021-05-12 NOTE — Progress Notes (Signed)
  Day of Surgery Note    Subjective:  sleeping in pacu-difficult to awake but does awake and follows commands   Vitals:   05/12/21 1157 05/12/21 1212  BP: (!) 164/90 (!) 151/86  Pulse: 88 88  Resp: 18 19  Temp: (!) 97 F (36.1 C)   SpO2: 100% 99%    Incisions:   left neck incision is clean and dry Extremities:  moves right leg and right hand; left hemiparesis Cardiac:  regular Lungs:  non labored    Assessment/Plan:  This is a 75 y.o. male who is s/p  Left TCAR  -pt received plavix and asa today per chart-reordered for tomorrow -Nuplizaid reordered.  Dr. Carlis Abbott received message that pt's wife wanted pharmacy order so that she can bring this from home and this was placed.  -pt moving right side (has previous left hemiparesis) -to 4 east later this afternoon.   Leontine Locket, PA-C 05/12/2021 12:29 PM [phone]

## 2021-05-12 NOTE — Transfer of Care (Signed)
Immediate Anesthesia Transfer of Care Note  Patient: Josejuan Hoaglin  Procedure(s) Performed: LEFT TRANSCAROTID ARTERY REVASCULARIZATION (Left)  Patient Location: PACU  Anesthesia Type:General  Level of Consciousness: drowsy and patient cooperative  Airway & Oxygen Therapy: Patient Spontanous Breathing and Patient connected to face mask oxygen  Post-op Assessment: Report given to RN and Post -op Vital signs reviewed and stable  Post vital signs: Reviewed and stable  Last Vitals:  Vitals Value Taken Time  BP 164/90 05/12/21 1157  Temp    Pulse 87 05/12/21 1201  Resp 19 05/12/21 1201  SpO2 100 % 05/12/21 1201  Vitals shown include unvalidated device data.  Last Pain:  Vitals:   05/12/21 0835  TempSrc:   PainSc: 0-No pain      Patients Stated Pain Goal: 3 (07/18/29 0762)  Complications: No notable events documented.

## 2021-05-12 NOTE — Anesthesia Procedure Notes (Signed)
Arterial Line Insertion Start/End7/18/2022 8:49 AM, 05/12/2021 8:53 AM Performed by: CRNA  Patient location: Pre-op. Preanesthetic checklist: patient identified, IV checked, site marked, risks and benefits discussed, surgical consent, monitors and equipment checked, pre-op evaluation, timeout performed and anesthesia consent Right, radial was placed Catheter size: 20 G Hand hygiene performed  and maximum sterile barriers used   Attempts: 1 Procedure performed without using ultrasound guided technique. Following insertion, Biopatch and dressing applied. Post procedure assessment: normal  Patient tolerated the procedure well with no immediate complications.

## 2021-05-12 NOTE — Progress Notes (Signed)
Called to see pt for altered mental status.  Pt usually knows his wife and situation at baseline.  He has been unable to identify his wife or current situation.  He keeps repeated "hole in the wall."  He is able to move the right hand and arm some on command.  He can move the right toes some but not really the leg.  He has a left hemiplegia baseline from prior stroke  He is mildly tachycardic in the 110s  Vitals:   05/12/21 1500 05/12/21 1600 05/12/21 1700 05/12/21 1800  BP: 138/84 137/73 (!) 156/90 (!) 164/81  Pulse: 76 100 100 100  Resp: 14 15 18    Temp: (!) 97.5 F (36.4 C)     TempSrc: Oral     SpO2: 99% 99% 98% 97%  Weight:      Height:        Left neck no hematoma  Assessment Code stroke was called  Plan: Neuro eval underway Will sent pt for stat CT angio head and neck  Wife updated at bedside hopefully this just represents ischemic penumbra from prior stroke but certainly neuro status is off from baseline  Will follow up CT later this evening.  Ruta Hinds, MD [facility] Office: [phone]

## 2021-05-12 NOTE — Progress Notes (Signed)
Pt arrived to 4e from PACU. Pt oriented to room and staff. Vitals obtained. Telemetry box applied and CCMD notified x2 verifiers.Right radial a-line zeroed. NIH completed. Wife to bedside and updated.

## 2021-05-12 NOTE — Op Note (Signed)
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  Ultrasound-guided access right common femoral vein 2.  Transcatheter placement of left carotid stent including angioplasty with distal embolic protection using TCAR flow reversal (left TCAR)  Surgeon: Dr. [name], MD  Assistant: Leontine Locket, PA  Indications: Patient is a 75 year old male who previously had a right MCA stroke with profound left-sided deficit.  He was recently seen in the office with contralateral high-grade greater than 80% left ICA stenosis.  We recommended carotid intervention with TCAR given his high risk with contralateral carotid occlusion.  He presents today after risk benefits discussed.  Findings: Initial ultrasound-guided access of right common femoral vein with placement of the venous flow reversal sheath.  After cutdown on the left common carotid we were able to get the sheath in place and the lesion was then crossed after active flow reversal and angioplastied with a 6 mm x 30 mm angioplasty balloon and then stented with a 8 mm x 40 mm Enroute stent.  Stent was widely patent with no residual stenosis.  Total flow reversal time was 6 minutes.  Anesthesia: General  Details: Patient was taken to the operating room after informed consent was obtained.  Placed on the operative table in the supine position and general endotracheal anesthesia was induced.  We placed a bump under shoulder and turned his head to the right and also padded all the pressure points.  Both groins and the left neck were then prepped and draped in usual sterile fashion.  Antibiotics were given.  Timeout was performed.  Initially evaluated the right common femoral vein with ultrasound, it was patent and image was saved.  This was accessed with a micro access needle placed a  microwire micro sheath.  I then advanced a Bentson wire I had some resistance and evaluated under fluoroscopy and this went into the vena cava to just below his existing vena cava filter.  The venous flow reversal sheath was then placed in the right groin and we had good venous return.  Then turned our attention to the left neck where a transverse incision was made 1 fingerbreadth above the clavicle.  Opened the platysma and used wheat Lander's for added visualization.  The head of the sternocleidomastoid was then split in a muscle-sparing technique.  Internal jugular vein was mobilized lateral as well as the vagus and we found the common carotid which was dissected out in both a vessel loop and an umbilical tape was placed.  Patient was given 100 units/kg IV heparin.  I placed a U stitch with a 5-0 Prolene in the anterior wall the artery.  A micro access needle placed a microwire and then a micro sheath.  We then got a carotid angiogram and marked the bifurcation.  We did elect to engage the external carotid for large arterial sheath placement.  I then passed my wire into the external carotid and advanced a micro sheath.  I then placed the J-wire into the external carotid artery and then placed our large arterial sheath in the common carotid artery where we placed the pursestring.  The sheath was secured.  Then placed the filter system was connected to the venous sheath.  At that point time we confirmed that our ACT was greater than 250 and we did a TCAR timeout.  The carotid was then imaged in several views with no evidence  of dissection and we marked the lesion in the proximal internal carotid.  We then went on active clamp and a good flow reversal in the sheath.  The lesion was crossed with a wire and then we ballooned this with a 6 mm x 30 mm angioplasty balloon to nominal pressure.  We then exchanged and placed 8 mm x 40 mm Enroute stent.  We allowed 2 additional minutes of active flow reversal.  Final carotid  angiogram showed a widely patent stent with no dissection or residual disease with good filling of the distal ICA.  That point time the wire was removed.  I did pull the arterial sheath back until we were just out of the arteriotomy and shot another carotid angiogram to ensure there was no dissection given we initially had some resistance advancing the sheath and this looks widely patent.  The sheath was removed and the pursestring was tied down.  Also evaluated this with ultrasound and looked patent with no dissection or flow limiting flap.  We listen with a Doppler and had good Doppler flow in the left carotid.  Protamine was given for reversal.  The venous sheath in the right groin was pulled out manual pressure was held.  We washed out the neck and placed Surgicel snow for hemostasis and the platysma was closed with 3-0 Vicryl and 4-0 Monocryl in the skin was closed with Dermabond  Complication: None  Condition: Stable  [name], MD [facility] Office: [place]

## 2021-05-12 NOTE — Anesthesia Postprocedure Evaluation (Signed)
Anesthesia Post Note  Patient: Robert [name]  Procedure(s) Performed: LEFT TRANSCAROTID ARTERY REVASCULARIZATION (Left)     Patient location during evaluation: PACU Anesthesia Type: General Level of consciousness: awake and alert Pain management: pain level controlled Vital Signs Assessment: post-procedure vital signs reviewed and stable Respiratory status: spontaneous breathing, nonlabored ventilation, respiratory function stable and patient connected to nasal cannula oxygen Cardiovascular status: blood pressure returned to baseline and stable Postop Assessment: no apparent nausea or vomiting Anesthetic complications: no   No notable events documented.  Last Vitals:  Vitals:   05/12/21 1327 05/12/21 1429  BP: (!) 153/86   Pulse: 85   Resp: 18   Temp: 36.5 C   SpO2: 100% 100%    Last Pain:  Vitals:   05/12/21 1327  TempSrc:   PainSc: 0-No pain                 Desarie Feild S

## 2021-05-12 NOTE — Progress Notes (Signed)
Pt's wife notified staff of pt being confused. Upon exam, pt states he's in [place], [place], but does not know his name. When asked again what his name was, he stated "2022" and kept repeating it. Pt's wife stated this is very different than his usual. On call surgeon notified and STAT head CT ordered. 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

## 2021-05-12 NOTE — Discharge Instructions (Signed)
   [facility]  Discharge Instructions   Carotid Surgery  Please refer to the following instructions for your post-procedure care. Your surgeon or physician assistant will discuss any changes with you.  Activity  You are encouraged to walk as much as you can. You can slowly return to normal activities but must avoid strenuous activity and heavy lifting until your doctor tell you it's okay. Avoid activities such as vacuuming or swinging a golf club. You can drive after one week if you are comfortable and you are no longer taking prescription pain medications. It is normal to feel tired for serval weeks after your surgery. It is also normal to have difficulty with sleep habits, eating, and bowel movements after surgery. These will go away with time.  Bathing/Showering  Shower daily after you go home. Do not soak in a bathtub, hot tub, or swim until the incision heals completely.  Incision Care  Shower every day. Clean your incision with mild soap and water. Pat the area dry with a clean towel. You do not need a bandage unless otherwise instructed. Do not apply any ointments or creams to your incision. You may have skin glue on your incision. Do not peel it off. It will come off on its own in about one week. Your incision may feel thickened and raised for several weeks after your surgery. This is normal and the skin will soften over time.   For Men Only: It's okay to shave around the incision but do not shave the incision itself for 2 weeks. It is common to have numbness under your chin that could last for several months.  Diet  Resume your normal diet. There are no special food restrictions following this procedure. A low fat/low cholesterol diet is recommended for all patients with vascular disease. In order to heal from your surgery, it is CRITICAL to get adequate nutrition. Your body requires vitamins, minerals, and protein. Vegetables are the best source of  vitamins and minerals. Vegetables also provide the perfect balance of protein. Processed food has little nutritional value, so try to avoid this.  Medications  Resume taking all of your medications unless your doctor or physician assistant tells you not to. If your incision is causing pain, you may take over-the- counter pain relievers such as acetaminophen (Tylenol). If you were prescribed a stronger pain medication, please be aware these medications can cause nausea and constipation. Prevent nausea by taking the medication with a snack or meal. Avoid constipation by drinking plenty of fluids and eating foods with a high amount of fiber, such as fruits, vegetables, and grains.   Do not take Tylenol if you are taking prescription pain medications.  Follow Up  Our office will schedule a follow up appointment 2-3 weeks following discharge.  Please call us immediately for any of the following conditions  . Increased pain, redness, drainage (pus) from your incision site. . Fever of 101 degrees or higher. . If you should develop stroke (slurred speech, difficulty swallowing, weakness on one side of your body, loss of vision) you should call 911 and go to the nearest emergency room. .  Reduce your risk of vascular disease:  . Stop smoking. If you would like help call [facility] at 1-800-QUIT-NOW (1-800-784-8669) or [facility] at 336-586-4000. . Manage your cholesterol . Maintain a desired weight . Control your diabetes . Keep your blood pressure down .  If you have any questions, please call the office at [phone]. 

## 2021-05-12 NOTE — H&P (Signed)
History and Physical Interval Note:  05/12/2021 9:43 AM  Robert [name]  has presented today for surgery, with the diagnosis of LEFT CAROTID ARTERY STENOSIS.  The various methods of treatment have been discussed with the patient and family. After consideration of risks, benefits and other options for treatment, the patient has consented to  Procedure(s): LEFT TRANSCAROTID ARTERY REVASCULARIZATION (Left) as a surgical intervention.  The patient's history has been reviewed, patient examined, no change in status, stable for surgery.  I have reviewed the patient's chart and labs.  Questions were answered to the patient's satisfaction.    Left TCAR for high grade stenosis of left ICA.  Previous right MCA stroke with profound left upper extremity weakness and left leg weakness as well.  Right side neuro intact.  [name]  Patient name: Robert [name]            MRN: [id number]        DOB: [birth year]          Sex: male   REASON FOR CONSULT: Evaluate for carotid artery disease   HPI: Robert [name] is a 75 y.o. male, with multiple medical problems including hypertension, hyperlipidemia, leg DVT, previous CVA and left-sided hemiparesis in 2008 that presents for evaluation of carotid artery disease.  Patient is here with his wife who provides most of the history.  She states that he was having evaluation of a sebaceous cyst in the neck and ultimately was found to have carotid disease.  Ultimately he was sent for CTA neck and CTA head at the [facility] by PCP Dr. Marjo Bicker.  The CTA neck on 04/08/2021 showed occlusion of the right cervical ICA that extended into the cavernous segment then reconstituted distally and then 67% stenosis of the proximal left ICA per report.  CT head also showed an old right MCA territory infarct.   He is in a wheelchair today but his wife states he is able to stand and walk limited distances.  He is able to transfer.  He has fairly profound left upper extremity weakness and 4 out of  5 strength in the left leg.  He remains on Eliquis for his previous DVT/stroke per the wife. No recent changes in his neurologic status.  Denies previous neck radiation.       Past Medical History:  Diagnosis Date   BPH (benign prostatic hyperplasia)     CVA (cerebral vascular accident) ([place])      with left sided hemiparesis   Diverticulosis     Frequency of urination     GERD (gastroesophageal reflux disease)     Gross hematuria     History of acute pyelonephritis      10-13-2012   History of CVA with residual deficit      2008--  left side of body weakness and foot drop (wears leg brace and uses cane)   History of DVT of lower extremity      2008--  cva   Hypercholesteremia     Hyperlipidemia     Hyperlipidemia     Hypertension     Left foot drop      secondary to cva 2008   Left leg DVT ([facility])     S/P insertion of IVC (inferior vena caval) filter      2008   Urethral stricture     Urgency of urination     Urinary retention     Weakness of left side of body      secondary to cva  2008   Wears glasses     Wears hearing aid      bilateral-- wears intermittantly           Past Surgical History:  Procedure Laterality Date   CYSTOSCOPY WITH RETROGRADE URETHROGRAM N/A 10/23/2015    Procedure: CYSTOSCOPY WITH RETROGRADE URETHROGRAM;  Surgeon: [name], MD;  Location: [facility];  Service: Urology;  Laterality: N/A;   CYSTOSCOPY WITH URETHRAL DILATATION N/A 10/23/2015    Procedure: CYSTOSCOPY WITH URETHRAL BALLOON DILATATION;  Surgeon: [name], MD;  Location: [facility];  Service: Urology;  Laterality: N/A;  BALLOON DILATION     IVC FILTER PLACEMENT ([place] [facility])   2008   LAPAROSCOPIC CHOLECYSTECTOMY SINGLE PORT N/A 01/09/2021    Procedure: LAPAROSCOPIC CHOLECYSTECTOMY WITH IOC;  Surgeon: [name], MD;  Location: [facility];  Service: General;  Laterality: N/A;  48 MIN           Family History  Problem Relation Age of Onset   Diabetes  Sister     Lung cancer Brother          Post 9/11 voluntary work in [employer]   Prostate [place]     Other Mother          passed away young- non medical   Alzheimer's disease Father     Healthy Son        SOCIAL HISTORY: Social History         Socioeconomic History   Marital status: Married      Spouse name: [name]   Number of children: 1   Years of education: 12   Highest education level: High school graduate  Occupational History   Occupation: retired      Comment: Public librarian  Tobacco Use   Smoking status: Former      Years: 10.00      Pack years: 0.00      Types: Cigarettes      Quit date: 10/26/1970      Years since quitting: 50.5   Smokeless tobacco: Never  Vaping Use   Vaping Use: Never used  Substance and Sexual Activity   Alcohol use: No   Drug use: No   Sexual activity: Not on file  Other Topics Concern   Not on file  Social History Narrative    Lives in [place] with wife. He is a English as a second language teacher.    Has one son who lives in [place]. He is healthy.    [facility] [place] [place]         Patient is right-handed. He lives with his wife in a one level home.    Social Determinants of Health       Financial Resource Strain: Not on file  Food Insecurity: Food Insecurity Present   Worried About [place] in the Last Year: Sometimes true   Ran Out of Food in the Last Year: Sometimes true  Transportation Needs: No Transportation Needs   Lack of Transportation (Medical): No   Lack of Transportation (Non-Medical): No  Physical Activity: Not on file  Stress: Not on file  Social Connections: Not on file  Intimate Partner Violence: Not on file           Allergies  Allergen Reactions   Propofol Other (See Comments)      "hiccups for weeks"   Lisinopril        Other reaction(s): Cough  Current Outpatient Medications  Medication Sig Dispense Refill   acetaminophen (TYLENOL) 500 MG tablet Take 1,000 mg by  mouth every 6 (six) hours as needed for moderate pain.       amLODipine (NORVASC) 5 MG tablet Take 1 tablet (5 mg total) by mouth daily.       apixaban (ELIQUIS) 5 MG TABS tablet Take 1 tablet (5 mg total) by mouth 2 (two) times daily. 60 tablet 0   bisacodyl (DULCOLAX) 5 MG EC tablet Take 1 tablet (5 mg total) by mouth daily as needed for moderate constipation. 30 tablet 0   busPIRone (BUSPAR) 7.5 MG tablet Take 7.5 mg by mouth 2 (two) times daily.       Carbidopa-Levodopa ER (SINEMET CR) 25-100 MG tablet controlled release TAKE TWO TABLETS BY MOUTH THREE TIMES DAILY  (Patient taking differently: Take 1-2 tablets by mouth See admin instructions. Takes 2 tablets in the morning, 2 tablets in the afternoon, and 1 tablet at night) 180 tablet 3   lidocaine (LIDODERM) 5 % Place 1 patch onto the skin daily. Remove & Discard patch within 12 hours or as directed by MD 14 patch 0   loratadine (CLARITIN) 10 MG tablet Take 10 mg by mouth daily.       losartan (COZAAR) 50 MG tablet Take 50 mg by mouth daily.       omeprazole (PRILOSEC) 20 MG capsule Take 20 mg by mouth daily as needed (heartburn).       Pimavanserin Tartrate (NUPLAZID) 34 MG CAPS Take 34 mg by mouth daily.       polyethylene glycol (MIRALAX) packet Take 17 g by mouth daily. (Patient taking differently: Take 17 g by mouth daily as needed for mild constipation.) 14 each 0   rasagiline (AZILECT) 1 MG TABS tablet Take 1 mg by mouth daily.       Simethicone 250 MG CAPS Take 250 mg by mouth daily as needed (bloating).       simvastatin (ZOCOR) 20 MG tablet Take 20 mg by mouth every evening.       tamsulosin (FLOMAX) 0.4 MG CAPS capsule Take 0.4 mg by mouth daily.       traZODone (DESYREL) 50 MG tablet Take 0.5 tablets (25 mg total) by mouth at bedtime as needed for sleep.        No current facility-administered medications for this visit.      REVIEW OF SYSTEMS:  [X]  denotes positive finding, [ ]  denotes negative finding Cardiac   Comments:   Chest pain or chest pressure:      Shortness of breath upon exertion:      Short of breath when lying flat:      Irregular heart rhythm:             Vascular      Pain in calf, thigh, or hip brought on by ambulation:      Pain in feet at night that wakes you up from your sleep:      Blood clot in your veins:      Leg swelling:             Pulmonary      Oxygen at home:      Productive cough:      Wheezing:             Neurologic      Sudden weakness in arms or legs:      Sudden numbness in arms or legs:  Sudden onset of difficulty speaking or slurred speech:      Temporary loss of vision in one eye:      Problems with dizziness:             Gastrointestinal      Blood in stool:      Vomited blood:             Genitourinary      Burning when urinating:      Blood in urine:             Psychiatric      Major depression:             Hematologic      Bleeding problems:      Problems with blood clotting too easily:             Skin      Rashes or ulcers:             Constitutional      Fever or chills:          PHYSICAL EXAM:    Vitals:    04/29/21 1047  BP: 130/76  Pulse: 96  Resp: 18  Temp: 97.8 F (36.6 C)  TempSrc: Temporal  SpO2: 97%  Weight: 230 lb (104.3 kg)  Height: 6\' 2"  (1.88 m)      GENERAL: The patient is a well-nourished male, in no acute distress. The vital signs are documented above. CARDIAC: There is a regular rate and rhythm. VASCULAR:  Palpable radial pulses bilateral upper extremities PULMONARY: No respiratory distress. ABDOMEN: Soft and non-tender. MUSCULOSKELETAL: There are no major deformities or cyanosis. NEUROLOGIC: Left upper extremity 0 out of 5.  Left lower extremity 4 out of 5 strength.  Right upper and lower extremity appears 5 out of 5 strength. SKIN: There are no ulcers or rashes noted. PSYCHIATRIC: The patient has a normal affect.        DATA:    CTA neck from 04/08/2021 reviewed and the right ICA is occluded  from the origin up to the skull base.  The left ICA has a high-grade greater than 80% stenosis by my review as pictured above.   Assessment/Plan:   75 year old male that presents for evaluation of carotid artery disease.  I discussed with him and his wife that his right ICA is occluded and I suspect this is from his stroke in 2008 where he has an old right MCA territory infarct and residual left sided weakness.  Discussed there would be no role for revascularization of this chronically occluded ICA on the right.  I do think he has a greater than 80% stenosis of the left ICA and have recommended a left carotid intervention.  Given the contralateral occlusion, I think TCAR would be his best option for overall stroke risk reduction.  I discussed risk and benefits of surgery with the patient including 1% risk of perioperative stroke as well as risk of bleeding, risk of anesthesia, etc.  I have asked that he start an 81 mg aspirin and also Plavix.  I offered to send the plavix prescription to his pharmacy.  The wife has asked that I send a note to his PCP, Dr. Marjo Bicker at the [facility] to have the Plavix prescription started at the [facility] given her concern for cost.  We will get him scheduled today for left TCAR.     [name], MD [facility] Office: [phone]

## 2021-05-12 NOTE — Consult Note (Addendum)
NEUROLOGY CONSULTATION NOTE   Date of service: May 12, 2021 Patient Name: Robert [name] MRN:  [id number] DOB:  May 09, 1946 Reason for consult: "Stroke code" Requesting Provider: [name], MD _ _ _   _ __   _ __ _ _  __ __   _ __   __ _  History of Present Illness  Robert [name] is a 75 y.o. 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 Post op was doing fine and moving his R side and was able to follow commands with some speech. He was last seen normal at 1700. He was then noted to have L gaze preference with receptive and expressive aphasia. A stroke code was called.  Per wife, at baseline, he is able to brush his teeth himself, will feed himself. He requires help with most other ADLs.   mRS: 4 tPA: not offered 2/42fresh  incision in his neck to access left ICA during TCAR today. Thrombectomy: Not offered, due to poor baseline. Discussed with Dr. Estanislado Pandy with Neuro IR and we spoke to patient's wife and felt that the risk outweighed potential benefit. NIHSS components Score: Comment  1a Level of Conscious 0[x]  1[]  2[]  3[]      1b LOC Questions 0[]  1[]  2[x]       1c LOC Commands 0[]  1[]  2[x]       2 Best Gaze 0[]  1[x]  2[]       3 Visual 0[x]  1[]  2[]  3[]      4 Facial Palsy 0[]  1[x]  2[]  3[]      5a Motor Arm - left 0[]  1[]  2[]  3[x]  4[]  UN[]    5b Motor Arm - Right 0[]  1[x]  2[]  3[]  4[]  UN[]    6a Motor Leg - Left 0[]  1[]  2[]  3[x]  4[]  UN[]    6b Motor Leg - Right 0[x]  1[]  2[]  3[]  4[]  UN[]    7 Limb Ataxia 0[x]  1[]  2[]  3[]  UN[]     8 Sensory 0[]  1[]  2[x]  UN[]      9 Best Language 0[]  1[]  2[x]  3[]      10 Dysarthria 0[]  1[x]  2[]  UN[]      11 Extinct. and Inattention 0[]  1[x]  2[]       TOTAL: 19       ROS   Unable to obtain due to aphasia.  Past History   Past Medical History:  Diagnosis Date   Anemia    BPH (benign prostatic  hyperplasia)    CVA (cerebral vascular accident) ([place])    with left sided hemiparesis   Diabetes mellitus without complication ([place])    Diverticulosis    Frequency of urination    GERD (gastroesophageal reflux disease)    Gross hematuria    History of acute pyelonephritis    10-13-2012   History of CVA with residual deficit    2008--  left side of body weakness and foot drop (wears leg brace and uses cane)   History of DVT of lower extremity    2008--  cva   Hypercholesteremia    Hyperlipidemia    Hyperlipidemia    Hypertension    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 2008   Left leg DVT ([place])    Neuromuscular disorder (HCC)    Parkinsons   S/P insertion of IVC (inferior vena caval) filter    2008   Urethral stricture    Urgency of urination  Urinary retention    Weakness of left side of body    secondary to cva 2008   Wears glasses    Wears hearing aid    bilateral-- wears intermittantly   Past Surgical History:  Procedure Laterality Date   CYSTOSCOPY WITH RETROGRADE URETHROGRAM N/A 10/23/2015   Procedure: CYSTOSCOPY WITH RETROGRADE URETHROGRAM;  Surgeon: [name], MD;  Location: [facility];  Service: Urology;  Laterality: N/A;   CYSTOSCOPY WITH URETHRAL DILATATION N/A 10/23/2015   Procedure: CYSTOSCOPY WITH URETHRAL BALLOON DILATATION;  Surgeon: [name], MD;  Location: [facility];  Service: Urology;  Laterality: N/A;  BALLOON DILATION    IVC FILTER PLACEMENT ([place] [facility])  2008   LAPAROSCOPIC CHOLECYSTECTOMY SINGLE PORT N/A 01/09/2021   Procedure: LAPAROSCOPIC CHOLECYSTECTOMY WITH IOC;  Surgeon: [name], MD;  Location: [facility];  Service: General;  Laterality: N/A;  36 MIN   Family History  Problem Relation Age of Onset   Diabetes Sister    Lung cancer Brother        Post 9/11 voluntary work in [employer]   Prostate [place]    Other Mother        passed away young- non medical   Alzheimer's disease Father    Healthy Son     Social History   Socioeconomic History   Marital status: Married    Spouse name: Robert [name]   Number of children: 1   Years of education: 12   Highest education level: High school graduate  Occupational History   Occupation: retired    Comment: Public librarian  Tobacco Use   Smoking status: Former    Years: 10.00    Types: Cigarettes    Quit date: 10/26/1970    Years since quitting: 50.5   Smokeless tobacco: Never  Vaping Use   Vaping Use: Never used  Substance and Sexual Activity   Alcohol use: No   Drug use: No   Sexual activity: Not on file  Other Topics Concern   Not on file  Social History Narrative   Lives in [place] with wife. He is a English as a second language teacher.   Has one son who lives in [place]. He is healthy.   [facility] [place], [place].      Patient is right-handed. He lives with his wife in a one level home.   Social Determinants of Health   Financial Resource Strain: Not on file  Food Insecurity: Food Insecurity Present   Worried About [place] in the Last Year: Sometimes true   Ran Out of Food in the Last Year: Sometimes true  Transportation Needs: No Transportation Needs   Lack of Transportation (Medical): No   Lack of Transportation (Non-Medical): No  Physical Activity: Not on file  Stress: Not on file  Social Connections: Not on file   Allergies  Allergen Reactions   Propofol Other (See Comments)    "hiccups for weeks"    Lisinopril Cough    Medications   Medications Prior to Admission  Medication Sig Dispense Refill Last Dose   apixaban (ELIQUIS) 5 MG TABS tablet Take 1 tablet (5 mg total) by mouth 2 (two) times daily. 60 tablet 0 Past Week   aspirin EC 81 MG tablet Take 81 mg by mouth daily. Swallow whole.   05/12/2021 at 0630   busPIRone (BUSPAR) 7.5 MG tablet Take 7.5 mg by mouth 2 (two) times daily.   05/12/2021 at 0630   Carbidopa-Levodopa ER (SINEMET CR)  25-100 MG tablet controlled release TAKE TWO TABLETS BY MOUTH  THREE TIMES DAILY  (Patient taking differently: Take 1-2.5 tablets by mouth See admin instructions. Takes 2.5 tablets in the 9am, 2.5 tablets in the 1 pm, and 1 tablet at 5pm) 180 tablet 3 05/12/2021 at 0630   clopidogrel (PLAVIX) 75 MG tablet Take 1 tablet (75 mg total) by mouth daily. 30 tablet 5 05/12/2021 at 0630   hydrOXYzine (ATARAX/VISTARIL) 10 MG tablet Take 10 mg by mouth at bedtime as needed for sleep or itching.   05/11/2021   lidocaine (LIDODERM) 5 % Place 1 patch onto the skin daily. Remove & Discard patch within 12 hours or as directed by MD (Patient taking differently: Place 1 patch onto the skin daily as needed (pain). Remove & Discard patch within 12 hours or as directed by MD) 14 patch 0    loratadine (CLARITIN) 10 MG tablet Take 10 mg by mouth daily as needed for allergies.   Past Week   memantine (NAMENDA) 10 MG tablet Take 20 mg by mouth at bedtime.   05/11/2021   metFORMIN (GLUCOPHAGE) 500 MG tablet Take 500 mg by mouth at bedtime.   05/11/2021   NYAMYC powder Apply 1 application topically daily as needed (yeast).      Pimavanserin Tartrate (NUPLAZID) 34 MG CAPS Take 34 mg by mouth daily.   05/12/2021 at 0630   polyethylene glycol (MIRALAX) packet Take 17 g by mouth daily. (Patient taking differently: Take 17 g by mouth daily as needed for mild constipation.) 14 each 0    rasagiline (AZILECT) 1 MG TABS tablet Take 1 mg by mouth daily.   05/12/2021 at 0630   simvastatin (ZOCOR) 20 MG tablet Take 20 mg by mouth every evening.   05/11/2021   tamsulosin (FLOMAX) 0.4 MG CAPS capsule Take 0.4 mg by mouth daily.   05/11/2021   acetaminophen (TYLENOL) 500 MG tablet Take 1,000 mg by mouth every 6 (six) hours as needed for moderate pain.   Unknown   amLODipine (NORVASC) 5 MG tablet Take 1 tablet (5 mg total) by mouth daily. (Patient not taking: No sig reported)   Not Taking   bisacodyl (DULCOLAX) 5 MG EC tablet Take 1 tablet (5 mg total) by mouth daily as needed for moderate constipation. (Patient  not taking: No sig reported) 30 tablet 0 Not Taking   losartan (COZAAR) 50 MG tablet Take 50 mg by mouth daily.      traZODone (DESYREL) 50 MG tablet Take 0.5 tablets (25 mg total) by mouth at bedtime as needed for sleep. (Patient not taking: No sig reported)   Not Taking     Vitals   Vitals:   05/12/21 1500 05/12/21 1600 05/12/21 1700 05/12/21 1800  BP: 138/84 137/73 (!) 156/90 (!) 164/81  Pulse: 76 100 100 100  Resp: 14 15 18    Temp: (!) 97.5 F (36.4 C)     TempSrc: Oral     SpO2: 99% 99% 98% 97%  Weight:      Height:         Body mass index is 29.52 kg/m.  Physical Exam   General: Laying comfortably in bed; in no acute distress. HENT: Normal oropharynx and mucosa. Normal external appearance of ears and nose. Neck: Supple, no pain or tenderness CV: No JVD. No peripheral edema. Pulmonary: Symmetric Chest rise. Normal respiratory effort. Abdomen: Soft to touch, non-tender. Ext: No cyanosis, edema, or deformity Skin: No rash. Normal palpation of skin.  Musculoskeletal: Normal digits  and nails by inspection. No clubbing.  Neurologic Examination  Mental status/Cognition: Awake, aphasic Speech/language: Non fluent, difficulty with comprehending, difficulty following commands. Cranial nerves:   CN II Pupils equal and reactive to light, no VF deficits   CN III,IV,VI L gaze prefernce, does cross midline.   CN V    CN VII L facial droop   CN VIII Turns head towards speech.   CN IX & X    CN XI    CN XII    Motor:  Muscle bulk: decreased on the left., tone increased on the left.  Unable to do detailed strength testing seonrady to aphasia and encephalopathy. R hand grip is a 4/5 R hip flexion: 4/5 LUE: 1/5 LLE: 1.5  Reflexes:  Right Left Comments  Pectoralis      Biceps (C5/6)     Brachioradialis (C5/6)      Triceps (C6/7)      Patellar (L3/4)      Achilles (S1)      Hoffman      Plantar     Jaw jerk    Sensation:  Light touch Absent in LUE and LLE   Pin  prick    Temperature    Vibration   Proprioception    Coordination/Complex Motor:  Unable to assess.  Labs   CBC:  Recent Labs  Lab 05/08/21 1130  WBC 6.7  HGB 15.0  HCT 47.7  MCV 90.9  PLT 518    Basic Metabolic Panel:  Lab Results  Component Value Date   NA 137 05/08/2021   K 4.0 05/08/2021   CO2 24 05/08/2021   GLUCOSE 146 (H) 05/08/2021   BUN 13 05/08/2021   CREATININE 1.10 05/08/2021   CALCIUM 9.3 05/08/2021   GFRNONAA >60 05/08/2021   GFRAA >60 09/28/2019   Lipid Panel:  Lab Results  Component Value Date   LDLCALC 38 01/05/2021   HgbA1c:  Lab Results  Component Value Date   HGBA1C 6.4 (H) 05/08/2021   Urine Drug Screen: No results found for: LABOPIA, COCAINSCRNUR, LABBENZ, AMPHETMU, THCU, LABBARB  Alcohol Level     Component Value Date/Time   ETH <5 05/08/2017 1427    CT Head without contrast(personally reviewed): CTH was negative for a large hypodensity concerning for a large territory infarct or hyperdensity concerning for an ICH. ASPECTS of 10, prior R MCA stroke.  CT angio Head and Neck with contrast(personally reviewed): L MCA M2 thrombus. L ICA stent appears patent.  MRI Brain: pending Impression   Navi Erber is a 75 y.o. 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 He was found to have a L MCA M2 thrombus in the sylvian fissure with ASPECTS of 10.  Not a candidate for tPA 2/2 incision in his neck and L ICA for TCAR today. Not a canddiate for thrombectomy due to poor functional baseline. Case discussed with Dr. Estanislado Pandy.   Primary Diagnosis:  Cerebral infarction due to embolism of  left middle cerebral artery.   Secondary Diagnosis: Essential (primary) hypertension, Type 2 diabetes  mellitus w/o complications, and Obesity  Recommendations   Plan:  - Frequent Neuro checks per stroke unit protocol - Recommend brain imaging with MRI Brain without contrast - Recommend obtaining TTE -  Recommend obtaining Lipid panel with LDL - Please start statin if LDL > 70 - Recommend HbA1c - Antithrombotic - per Vasc surgery. - Recommend DVT ppx - SBP goal - permissive hypertension first 24 h < 170/110. (Per Dr. Oneida Alar with Vasc surgery) - Recommend Telemetry monitoring for arrythmia - Recommend bedside swallow screen prior to PO intake. - Stroke education booklet - Recommend PT/OT/SLP consult  _____________________________________________________________________  Plan discussed with Dr. Oneida Alar with Vasc surgery and with patient's wife over the phone.  This patient is critically ill and at significant risk of neurological worsening, death and care requires constant monitoring of vital signs, hemodynamics,respiratory and cardiac monitoring, neurological assessment, discussion with family, other specialists and medical decision making of high complexity. I spent 90 minutes of neurocritical care time  in the care of  this patient. This was time spent independent of any time provided by nurse practitioner or PA.  Donnetta Simpers [facility] Pager Number [phone] 05/12/2021  8:18 PM   Thank you for the opportunity to take part in the care of this patient. If you have any further questions, please contact the neurology consultation attending.  Signed,  [place] Pager Number [phone] _ _ _   _ __   _ __ _ _  __ __   _ __   __ _

## 2021-05-12 NOTE — Anesthesia Procedure Notes (Signed)
Procedure Name: Intubation Date/Time: 05/12/2021 10:10 AM Performed by: Thelma Comp, CRNA Pre-anesthesia Checklist: Patient identified, Emergency Drugs available, Suction available and Patient being monitored Patient Re-evaluated:Patient Re-evaluated prior to induction Oxygen Delivery Method: Circle System Utilized Preoxygenation: Pre-oxygenation with 100% oxygen Induction Type: IV induction Ventilation: Mask ventilation without difficulty Laryngoscope Size: Mac and 4 Grade View: Grade I Tube type: Oral Tube size: 7.5 mm Number of attempts: 1 Airway Equipment and Method: Stylet and Oral airway Placement Confirmation: ETT inserted through vocal cords under direct vision, positive ETCO2 and breath sounds checked- equal and bilateral Secured at: 23 cm Tube secured with: Tape Dental Injury: Teeth and Oropharynx as per pre-operative assessment

## 2021-05-12 NOTE — Significant Event (Signed)
Rapid Response Event Note   Reason for Call :  R sided weakness and aphasia post L ICA TCAR with stent placement  Per RN, confusion noted around 1700, MD was notified and CT head STAT was ordered. At [place], pt began to have R sided weakness and aphasia-RRT was then notified. On RRT arrival, Dr. Oneida Alar at beside assessing pt.   Initial Focused Assessment:  Pt lying in bed with eyes open, in no distress. He is repeating himself and not answer questions appropriately or following commands. He has a gaze preference to the L and R sided weakness. Pupils 4, equal, and sluggish. NIH-23  HR-118, BP-186/87, RR-19, SpO2-94% SpO2.   Code Stroke called at 1902, paged out at 1904, and pt arrived in [place] at 1912. See Stroke flowsheet for further details. CT head-old R MCA territory infarct without acute intracranial abnormality, CTA-L MCA M2 thrombus. TPA not given d/t surgery today, pt not an IR candidate. Pt transported back to 4E01 with bedside RN x2 and RRT.   Interventions:  CBG-212 Code Stroke initiated  Plan of Care:  Pt with L MCA CVA. Keep NPO until pt passes bedside swallow eval. Await neurology orders. Continue to monitor pt closely. Call RRT if further assistance needed.    Event Summary:   MD Notified: Dr. Oneida Alar at bedside on my arrival, Dr. Lorrin Goodell with neuro met pt in hallway en route to CT scan Call [place], Earnest Thalman Anderson, RN

## 2021-05-12 NOTE — Progress Notes (Addendum)
Case d/w Neurology.  Pt with MCA branch occlusion probably occurred around 5 pm.  Neurointerventional believes risk of intervention higher than conservative management.  Have spoken with neurology and they would like permissive hypertension we are shooting for goal less than 075 and diastolic less than 732.  I do not believe he is a candidate for systemic TPA with open carotid exposure.  Hopefully will improve over the next few weeks/days.  Carotid stent is patent.  Ruta Hinds, MD [facility] Office: [phone]

## 2021-05-13 ENCOUNTER — Inpatient Hospital Stay (HOSPITAL_COMMUNITY): Payer: Medicare Other

## 2021-05-13 ENCOUNTER — Encounter (HOSPITAL_COMMUNITY): Payer: Self-pay | Admitting: Vascular Surgery

## 2021-05-13 DIAGNOSIS — I6522 Occlusion and stenosis of left carotid artery: Principal | ICD-10-CM

## 2021-05-13 LAB — BASIC METABOLIC PANEL
Anion gap: 8 (ref 5–15)
BUN: 11 mg/dL (ref 8–23)
CO2: 23 mmol/L (ref 22–32)
Calcium: 8.9 mg/dL (ref 8.9–10.3)
Chloride: 107 mmol/L (ref 98–111)
Creatinine, Ser: 0.91 mg/dL (ref 0.61–1.24)
GFR, Estimated: >60 mL/min (ref >60)
Glucose, Bld: 145 mg/dL — ABNORMAL HIGH (ref 70–99)
Potassium: 4 mmol/L (ref 3.5–5.1)
Sodium: 138 mmol/L (ref 135–145)

## 2021-05-13 LAB — CBC
HCT: 42.3 % (ref 39.0–52.0)
Hemoglobin: 14.1 g/dL (ref 13.0–17.0)
MCH: 29.6 pg (ref 26.0–34.0)
MCHC: 33.3 g/dL (ref 30.0–36.0)
MCV: 88.7 fL (ref 80.0–100.0)
Platelets: 311 10*3/uL (ref 150–400)
RBC: 4.77 MIL/uL (ref 4.22–5.81)
RDW: 13.7 % (ref 11.5–15.5)
WBC: 12.1 10*3/uL — ABNORMAL HIGH (ref 4.0–10.5)
nRBC: 0 % (ref 0.0–0.2)

## 2021-05-13 LAB — LIPID PANEL
Cholesterol: 78 mg/dL (ref 0–200)
HDL: 35 mg/dL — ABNORMAL LOW (ref >40)
LDL Cholesterol: 38 mg/dL (ref 0–99)
Total CHOL/HDL Ratio: 2.2 RATIO
Triglycerides: 27 mg/dL (ref <150)
VLDL: 5 mg/dL (ref 0–40)

## 2021-05-13 LAB — GLUCOSE, CAPILLARY
Glucose-Capillary: 128 mg/dL — ABNORMAL HIGH (ref 70–99)
Glucose-Capillary: 146 mg/dL — ABNORMAL HIGH (ref 70–99)
Glucose-Capillary: 164 mg/dL — ABNORMAL HIGH (ref 70–99)
Glucose-Capillary: 184 mg/dL — ABNORMAL HIGH (ref 70–99)

## 2021-05-13 LAB — HEMOGLOBIN A1C
Hgb A1c MFr Bld: 6.4 % — ABNORMAL HIGH (ref 4.8–5.6)
Mean Plasma Glucose: 136.98 mg/dL

## 2021-05-13 MED ORDER — APIXABAN 5 MG PO TABS: 5.0000 mg | ORAL_TABLET | Freq: Two times a day (BID) | ORAL | Status: DC

## 2021-05-13 NOTE — Evaluation (Signed)
Physical Therapy Evaluation Patient Details Name: Robert [name] MRN: [id number] DOB: [birth year] Today's Date: 05/13/2021   History of Present Illness  Robert [name] is a 75 y.o. male who presented for L transcarotid artery revascularization on 7/18 due to L carotid artery stenosis. After procedure on 7/18, pt noted with aphasia, R gaze preference, and R sided weakness. 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 PMH: HTN, HLD, leg DVT, CVA in 2008 with L hemiparesis   Clinical Impression  Patient received in bed sleeping. Wakes easily to voice. He is agreeable to PT assessment. Patient requires max/total assist of +1-2 for bed mobility. Transfers sit to stand from elevated bed with min +2 assist. Once standing he is able to take one-two steps, but then knees began to flex and patient was unable to step backward to get to bed. Required max +2 to safely get seated back on bed. Patient will continue to benefit from skilled PT while here to improve functional mobility and reduce caregiver burden.          Follow Up Recommendations CIR    Equipment Recommendations  None recommended by PT    Recommendations for Other Services       Precautions / Restrictions Precautions Precautions: Fall Precaution Comments: L neck incision, hx of L hemiparesis Restrictions Weight Bearing Restrictions: No      Mobility  Bed Mobility Overal bed mobility: Needs Assistance Bed Mobility: Supine to Sit;Sit to Supine     Supine to sit: Total assist;HOB elevated Sit to supine: Total assist;+2 for physical assistance   General bed mobility comments: Total A x 2 to advance B LE and lift/guide trunk    Transfers Overall transfer level: Needs assistance Equipment used: Hemi-walker Transfers: Sit to/from Stand Sit to Stand: From elevated surface;Min assist;+2 physical assistance         General transfer comment: min assist +2 to stand from elevated bed.  Ambulation/Gait Ambulation/Gait assistance:  Min assist;+2 physical assistance;Mod assist Gait Distance (Feet): 2 Feet Assistive device: Hemi-walker Gait Pattern/deviations: Step-to pattern;Decreased step length - right Gait velocity: decr   General Gait Details: patient able to take a step or two with right LE with hemiwalker and min/mod assist +2. After this patient began sinking with increased B Knee flexion in standing. Required max +2 to get back onto bed safely.  Stairs            Wheelchair Mobility    Modified Rankin (Stroke Patients Only)       Balance Overall balance assessment: Needs assistance Sitting-balance support: Feet supported Sitting balance-Leahy Scale: Fair Sitting balance - Comments: able to sit with supervision   Standing balance support: Single extremity supported;During functional activity Standing balance-Leahy Scale: Fair Standing balance comment: reliant on UE support and external support                             Pertinent Vitals/Pain Pain Assessment: No/denies pain    Home Living Family/patient expects to be discharged to:: Private residence Living Arrangements: Spouse/significant other Available Help at Discharge: Family;Available 24 hours/day Type of Home: Apartment Home Access: Level entry     Home Layout: One level Home Equipment: Transport chair;Hospital bed;Tub bench;Bedside commode;Hand held shower head;Grab bars - tub/shower;Other (comment) Additional Comments: Tub bench on rails that slides and swivels.  Pt's wife states that transferring pt into tub is very difficult due to pt freezing with inability to sit on command. Hospital bed with air  mattress.  Transport chair. Hemi-walker    Prior Function Level of Independence: Needs assistance   Gait / Transfers Assistance Needed: wife assists pt into transport chair. non-ambulatory except for a couple of steps with assistance and hemiwalker.  Going to outpatient PT 2x/week. Uses sit to stand transfer frame with  wife assist  ADL's / Homemaking Assistance Needed: wife assists with all ADLs and IADLs aside from self feeding and simple grooming tasks such as brushing teeth  Comments: Wife assists with all ADLs.     Hand Dominance   Dominant Hand: Right    Extremity/Trunk Assessment   Upper Extremity Assessment Upper Extremity Assessment: LUE deficits/detail LUE Deficits / Details: hx of hemiparesis, spasticity noted especially in digits and elbow. Minor deficits with passive wrist and elbow motion though also to be easily stretched LUE Sensation: decreased light touch LUE Coordination: decreased fine motor;decreased gross motor    Lower Extremity Assessment Lower Extremity Assessment: LLE deficits/detail LLE Coordination: decreased gross motor    Cervical / Trunk Assessment Cervical / Trunk Assessment: Normal  Communication   Communication: HOH  Cognition Arousal/Alertness: Awake/alert Behavior During Therapy: Flat affect Overall Cognitive Status: Impaired/Different from baseline Area of Impairment: Orientation;Attention;Memory;Following commands;Safety/judgement;Awareness;Problem solving                 Orientation Level: Disoriented to;Time;Situation Current Attention Level: Sustained Memory: Decreased short-term memory Following Commands: Follows one step commands inconsistently;Follows one step commands with increased time Safety/Judgement: Decreased awareness of deficits Awareness: Intellectual Problem Solving: Slow processing;Decreased initiation;Difficulty sequencing;Requires verbal cues General Comments: mild confusion noted though wife reports this is improved from yesterday's episode      General Comments General comments (skin integrity, edema, etc.): VSS on RA, BP stable    Exercises     Assessment/Plan    PT Assessment Patient needs continued PT services  PT Problem List Decreased strength;Decreased mobility;Decreased activity tolerance;Decreased  balance;Decreased safety awareness;Decreased cognition;Decreased coordination       PT Treatment Interventions DME instruction;Therapeutic exercise;Gait training;Balance training;Functional mobility training;Therapeutic activities;Patient/family education;Neuromuscular re-education    PT Goals (Current goals can be found in the Care Plan section)  Acute Rehab PT Goals Patient Stated Goal: would like for pt to have rehab at hospital prior to return home PT Goal Formulation: With family Time For Goal Achievement: 05/27/21 Potential to Achieve Goals: Good    Frequency Min 3X/week   Barriers to discharge        Co-evaluation               AM-PAC PT "6 Clicks" Mobility  Outcome Measure Help needed turning from your back to your side while in a flat bed without using bedrails?: A Lot Help needed moving from lying on your back to sitting on the side of a flat bed without using bedrails?: Total Help needed moving to and from a bed to a chair (including a wheelchair)?: A Lot Help needed standing up from a chair using your arms (e.g., wheelchair or bedside chair)?: A Lot Help needed to walk in hospital room?: Total Help needed climbing 3-5 steps with a railing? : Total 6 Click Score: 9    End of Session Equipment Utilized During Treatment: Gait belt Activity Tolerance: Patient tolerated treatment well;Patient limited by fatigue Patient left: in bed;with call bell/phone within reach;with bed alarm set Nurse Communication: Mobility status PT Visit Diagnosis: Other abnormalities of gait and mobility (R26.89);Muscle weakness (generalized) (M62.81);Difficulty in walking, not elsewhere classified (R26.2)    Time: 8416-6063 PT Time Calculation (min) (  ACUTE ONLY): 28 min   Charges:   PT Evaluation $PT Eval Moderate Complexity: 1 Mod PT Treatments $Therapeutic Activity: 8-22 mins        Amanda Cockayne, PT, GCS 05/13/21,12:26 PM

## 2021-05-13 NOTE — Progress Notes (Addendum)
STROKE TEAM PROGRESS NOTE    Interval History   Wife at bedside, states patient has improved since yesterday. He has been able to talk and move his right side.   Neurological exam has improved in comparison to yesterdays notes- no aphasia noted and he is antigravity in the right upper and lower extremity.  IV tPA was not given secondary to fresh incision in the neck from TCAR procedure yesterday and thrombectomy was not considered due to poor baseline modified Rankin after Case was discussed with interventional neuroradiology and patient's wife risk was believed to outweigh potential benefit.  Pertinent Lab Work and Imaging    05/12/21 CT Head WO IV Contrast 1. Old right MCA territory infarct without acute intracranial abnormality. 2. ASPECTS is 10.  05/12/21 CT Angio Head and Neck W WO IV Contrast 1. Suspected occlusion of a left MCA M2 branch at a branch point in the Sylvian fissure.  2. 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 3. Patent left ICA stent. 4. Gas tracking superiorly along the course of the left sternocleidomastoid muscle, possibly recent central venous catheter placement attempt.  05/13/21 MRI Brain WO IV Contrast Results pending   05/13/21 Echocardiogram Complete  Ordered, results pending   Physical Examination   Constitutional: Calm, appropriate for condition  Cardiovascular: Normal RR Respiratory: No increased WOB   Mental status: Oriented to person, place, time not to situation  Speech: Fluent with repetition and naming intact No paraphasic errors.  Able to name and repeat well. Cranial nerves: EOMI, VFF, Tongue midline,  Motor: Normal bulk and tone. No drift.   Dlt Bic Tri FgS Grp HF  KnF KnE PIF DoF  R 4 4 4 4 4 3 3 3 3 3   L 2 2 2  0 0 2 2 2 2 2   Sensory: Intact to light touch throughout  Coordination: FNF intact RUE  Gait: Deferred due to BLE weakness     Assessment and Plan   Robert [name] is a 75 y.o. male w/pmh of   prior R MCA with residual L sided weakness, prior hx of DM2, GERD, HLD who was admitted and underwent LICA TCAR and stent placement for known L ICA 80% stenosis. Post op he developed L gaze preference along with expressive and receptive aphasia for which a stroke code was called. He was not candidate for IVTPA given recent surgery and NIR declined offering intervention given the risks of surgery outweighed benefits.   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 MRI Brain results and echo results are pending. Stroke labs w/LDL 38, A1C 6.4. Stroke is likely in the setting of LICA TCAR procedure with subsequent embolization.  -Continue Aspirin and Plavix for secondary stroke prevention, vascular surgery to decide duration  - Will decide on whether to resume [facility] pending MRI findings, on [facility] due to prior DVT but it was in October 2020 and question if patient really needs to continue to be on long-term anticoagulation -Continue Simvastatin 20 mg QD for secondary stroke prevention  -Referral placed for stroke follow up at discharge   #Hypertension He has a history of HTN and takes Amlodipine 5 mg QD + Losartan 50 mg QD at home. Continue holding  home blood pressure regimen at this time to allow for permissive hypertension, liberalizing pressures up to 170. - Continue permissive HTN   #Hyperlipidemia From a stroke prevention stand point, the LDL goal is < 70. LDL is at goal, continue home Simvastatin 20 mg QD   #DMII  Hemoglobin A1C this admission noted to be 6.4, at goal from a stroke prevention standpoint. Recommend managing with SSI while inpatient, can resume outpatient diabetic regimen at discharge.   Hospital day # 1  Ruta Hinds, NP  [facility] Nurse Practitioner Patient seen and discussed with attending physician Dr.Amol Domanski   Stroke MD note:  I have personally obtained history,examined this patient, reviewed notes, independently viewed imaging studies, participated in medical decision making and plan of care.ROS completed by me personally and pertinent positives fully documented  I have made any additions or clarifications directly to the above note. Agree with note above.  Patient developed sudden onset of aphasia following TCAR procedure yesterday and was not given tPA due to recent surgery and thrombectomy due to poor baseline functioning and discussion with family and risk-benefit considered not to be in favor.  He fortunately seems to have improved and his aphasia is almost back to baseline.  MRI scan shows only tiny scattered left MCA and basal ganglia infarcts.  Patient may resume anticoagulation but I question if he really needs it since his episode of DVT was in October 2020.  Start aspirin for now till decision about long-term anticoagulation can be made.  Continue ongoing stroke work-up.  Mobilize out of bed.  Therapy consults.  Greater than 50% time during this 35-minute visit was spent in counseling and coordination of care and answering questions.  Antony Contras, MD Medical Director [facility] Pager: [phone] 05/13/2021 4:34 PM  To contact Stroke Continuity provider, please refer to [email or web address] After hours, contact General Neurology

## 2021-05-13 NOTE — Evaluation (Signed)
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 01/07/2021   Gallstone pancreatitis 01/07/2021   Pressure injury of skin 01/05/2021   Sepsis secondary to UTI ([place]) 11/10/2020   AKI (acute kidney injury) ([place]) 11/10/2020   Hyperkalemia 11/10/2020   Parkinson's disease ([place]) 11/10/2020   Hyperglycemia 11/10/2020   Elevated LFTs 11/10/2020   Diarrhea    Other hydronephrosis    History of deep vein thrombosis (DVT) of lower extremity    Urinary retention 08/14/2019   History of CVA with residual deficit    BPH (benign prostatic hyperplasia)    Orthostatic hypotension 08/13/2019   Hyperlipidemia    Hemiparesis affecting left side as late effect of cerebrovascular accident ([place]) 12/06/2017   Gait abnormality 11/22/2017   UTI (urinary tract infection) 04/22/2017   Acute lower UTI 04/22/2017   Tremor 04/22/2017   Acute pyelonephritis 09/29/2012   Nausea 09/29/2012   Fever 09/29/2012   HTN (hypertension) 09/29/2012   H/O: CVA (cerebrovascular accident) 09/29/2012   Hearing loss 09/29/2012   Past Medical History:  Past Medical History:  Diagnosis Date   Anemia    BPH (benign prostatic hyperplasia)    CVA (cerebral vascular accident) ([place])    with left sided hemiparesis   Diabetes mellitus without complication ([place])    Diverticulosis    Frequency of urination    GERD (gastroesophageal reflux disease)    Gross hematuria    History of acute pyelonephritis    10-13-2012   History of CVA with  residual deficit    2008--  left side of body weakness and foot drop (wears leg brace and uses cane)   History of DVT of lower extremity    2008--  cva   Hypercholesteremia    Hyperlipidemia    Hyperlipidemia    Hypertension    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 2008   Left leg DVT ([facility])    Neuromuscular disorder ([facility])    Parkinsons   S/P insertion of IVC (inferior vena caval) filter    2008   Urethral stricture    Urgency of urination    Urinary retention    Weakness of left side of body    secondary to cva 2008   Wears glasses    Wears hearing aid    bilateral-- wears intermittantly   Past Surgical History:  Past Surgical History:  Procedure Laterality Date   CYSTOSCOPY WITH RETROGRADE URETHROGRAM N/A 10/23/2015   Procedure: CYSTOSCOPY WITH RETROGRADE URETHROGRAM;  Surgeon: [name], MD;  [facility];  Service: Urology;  Laterality: N/A;   CYSTOSCOPY WITH URETHRAL DILATATION N/A 10/23/2015   Procedure: CYSTOSCOPY WITH URETHRAL BALLOON DILATATION;  Surgeon: [name], MD;  Location: [facility];  Service: Urology;  Laterality: N/A;  BALLOON DILATION    IVC FILTER PLACEMENT ([place] [facility])  2008   LAPAROSCOPIC CHOLECYSTECTOMY SINGLE PORT N/A 01/09/2021   Procedure: LAPAROSCOPIC CHOLECYSTECTOMY WITH IOC;  Surgeon: [name], MD;  Location: [facility];  Service: General;  Laterality: N/A;  90 MIN   TRANSCAROTID  ARTERY REVASCULARIZATION  Left 05/12/2021   Procedure: LEFT TRANSCAROTID ARTERY REVASCULARIZATION;  Surgeon: [name], MD;  Location: [facility];  Service: Vascular;  Laterality: Left;   HPI:  Robert [name] is a 75 y.o. male who presented for L transcarotid artery revascularization on 7/18 due to L carotid artery stenosis. After procedure on 7/18, pt noted with aphasia, R gaze preference, and R sided weakness. 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 PMH: HTN, HLD, leg DVT, CVA in 2008 with L hemiparesis   Assessment / Plan  / Recommendation Clinical Impression  Pt has made significant improvements with communication and mentation compared to previous date and even this morning upon review of documentation as well as report of staff, wife, and caregiver. At this time his comprehension seems appropriate with simple command following, yes/no questions, and simple conversation. He does still have some expressive difficulties, particularly when challenged with more structured linguistic tasks. During activities like divergent naming, descriptive language tasks, and verbal sequencing, pt starts to exhibit perseveration and word-finding errors, as well as difficulty at times with maintaining his train of thought. This is still a change from baseline, so will continue to follow to address expressive language. Of note, RN reports that pt has passed the [facility] swallow screen (diet already ordered) and she plans to document this.     SLP Assessment  SLP Recommendation/Assessment: Patient needs continued Speech Lanaguage Pathology Services SLP Visit Diagnosis: Aphasia (R47.01)    Follow Up Recommendations  Inpatient Rehab    Frequency and Duration min 2x/week  2 weeks      SLP Evaluation Cognition  Overall Cognitive Status: Difficult to assess (language difficulties) Arousal/Alertness: Awake/alert Orientation Level: Oriented X4       Comprehension  Auditory Comprehension Overall Auditory Comprehension: Appears within functional limits for tasks assessed    Expression Expression Primary Mode of Expression: Verbal Verbal Expression Overall Verbal Expression: Impaired Automatic Speech: Name;Social Response Level of Generative/Spontaneous Verbalization: Sentence Naming: Impairment Divergent:  (10 words in one minute) Verbal Errors: Perseveration;Other (comment) (word finding errors) Pragmatics: Impairment Impairments: Eye contact Non-Verbal Means of Communication: Not applicable   Oral / Motor  Motor  Speech Overall Motor Speech: Appears within functional limits for tasks assessed   GO                    Osie Bond., M.A. [facility] Pager [phone] Office [phone]  05/13/2021, 4:36 PM

## 2021-05-13 NOTE — Progress Notes (Signed)
Inpatient Rehab Admissions Coordinator Note:   Per OT recommendations, pt was screened for CIR candidacy by Shann Medal, PT, DPT.  At this time we are recommending a CIR consult and I will place an order per our protocol.  Please contact me with questions.   Shann Medal, PT, DPT [phone] 05/13/21 10:06 AM

## 2021-05-13 NOTE — Progress Notes (Signed)
Echo attempted. Patient with speech therapy. Will attempt again later as time permits.

## 2021-05-13 NOTE — Progress Notes (Signed)
PHARMACIST LIPID MONITORING   Robert [name] is a 75 y.o. male admitted on 05/12/2021 with PVD.  Pharmacy has been consulted to optimize lipid-lowering therapy with the indication of secondary prevention for clinical ASCVD.  Recent Labs:  Lipid Panel (last 6 months):   Lab Results  Component Value Date   CHOL 78 05/13/2021   TRIG 27 05/13/2021   HDL 35 (L) 05/13/2021   CHOLHDL 2.2 05/13/2021   VLDL 5 05/13/2021   LDLCALC 38 05/13/2021    Hepatic function panel (last 6 months):   Lab Results  Component Value Date   AST 15 05/08/2021   ALT 9 05/08/2021   ALKPHOS 72 05/08/2021   BILITOT 0.6 05/08/2021    SCr (since admission):   Serum creatinine: 0.91 mg/dL 05/13/21 0210 Estimated creatinine clearance: 93.1 mL/min  Current therapy and lipid therapy tolerance Current lipid-lowering therapy: simvastatin 20mg  Previous lipid-lowering therapies (if applicable): N/a Documented or reported allergies or intolerances to lipid-lowering therapies (if applicable): none  Assessment:   Patient prefers no changes in lipid-lowering therapy at this time due to LDL already <55  Plan:    1.Statin intensity (high intensity recommended for all patients regardless of the LDL):  No statin changes. The patient is already on a high intensity statin.  2.Add ezetimibe (if any one of the following):   Not indicated at this time.  3.Refer to lipid clinic:   No  4.Follow-up with:  Primary care provider - [name], MD  5.Follow-up labs after discharge:  No changes in lipid therapy, repeat a lipid panel in one year.      Onnie Boer, PharmD, BCIDP, AAHIVP, CPP Infectious Disease Pharmacist 05/13/2021 7:04 AM

## 2021-05-13 NOTE — Progress Notes (Addendum)
Progress Note    05/13/2021 7:23 AM 1 Day Post-Op  Subjective:  overnight events noted. Wife is at bedside and reports no change overnight   Vitals:   05/13/21 0400 05/13/21 0600  BP: 127/79 140/78  Pulse:    Resp: 18 18  Temp: 99 F (37.2 C) 98.7 F (37.1 C)  SpO2: 96% 95%   Physical Exam: Cardiac:  regular Lungs: non labored Incisions:  left neck incision is clean, dry and intact without swelling or hematoma Extremities:  well perfused and warm with palpable DP bilaterally Neurologic: awakens to voice, aphasic, difficulty following commands, left hemiparesis with decreased muscle tone of upper and lower extremities, right motor decreased, hand grip 3/5, is able to wiggle toes, unable to raise upper or lower extremities off bed  CBC    Component Value Date/Time   WBC 12.1 (H) 05/13/2021 0210   RBC 4.77 05/13/2021 0210   HGB 14.1 05/13/2021 0210   HCT 42.3 05/13/2021 0210   PLT 311 05/13/2021 0210   MCV 88.7 05/13/2021 0210   MCH 29.6 05/13/2021 0210   MCHC 33.3 05/13/2021 0210   RDW 13.7 05/13/2021 0210   LYMPHSABS 1.0 01/06/2021 0336   MONOABS 1.6 (H) 01/06/2021 0336   EOSABS 0.5 01/06/2021 0336   BASOSABS 0.1 01/06/2021 0336    BMET    Component Value Date/Time   NA 138 05/13/2021 0210   K 4.0 05/13/2021 0210   CL 107 05/13/2021 0210   CO2 23 05/13/2021 0210   GLUCOSE 145 (H) 05/13/2021 0210   BUN 11 05/13/2021 0210   CREATININE 0.91 05/13/2021 0210   CALCIUM 8.9 05/13/2021 0210   GFRNONAA >60 05/13/2021 0210   GFRAA >60 09/28/2019 2052    INR    Component Value Date/Time   INR 1.2 05/08/2021 1130     Intake/Output Summary (Last 24 hours) at 05/13/2021 0723 Last data filed at 05/13/2021 0325 Gross per 24 hour  Intake 2364.69 ml  Output 2400 ml  Net -35.31 ml     Assessment/Plan:  75 y.o. male is s/p  1.  Ultrasound-guided access right common femoral vein 2.  Transcatheter placement of left carotid stent including angioplasty with distal  embolic protection using TCAR flow reversal (left TCAR) 1 Day Post-Op . Post operative MCA branch occlusion on CTA. L ICA stent patent. Not candidate for thrombectomy due to functional status  - MRI brain ordered - swallow eval pending - Appreciate Neurology assistance - continue permissive hypertension <170/110 - Continue Simvastatin 20 mg - Plavix and Aspirin - PT/ OT/SLP eval    Karoline Caldwell, PA-C [facility] [phone] 05/13/2021 7:23 AM  I have seen and evaluated the patient. I agree with the PA note as documented above.  Postop day 1 status post left TCAR for asymptomatic high-grade stenosis.  Woke up in the OR neurologically at baseline and was doing well in PACU. I saw him several times recovery and was at neurologic baseline.  Around 5 PM last night was noted to have change in his neuro status with code stroke.  I reviewed his imaging and suspected occlusion of the left MCA M2 branch.  The left carotid stent is widely patent.  Apparently last night was not really moving his right leg.  Fortunately this morning he does appear to be raising his right leg and right arm off the bed with pretty good grip strength.  He does recognize his wife which is an improvement in his cognitive status.  We will continue  aspirin Plavix statin for the left carotid stent.  I will also restart his Eliquis.  MRI brain is ordered for today.  Appreciate neurology following.  We will also get PT OT and speech therapy to see him.  I discussed with his wife that I would anticipate he will likely need SNF but will certainly allow the work-up to continue.  He has a known right carotid occlusion previous right MCA stroke and has a fairly profound deficit on the left side at baseline prior to his hospital presentation.  He was living at home using a wheelchair before this.  We will have to see how he progresses.  [name], MD [facility] [place] Office:  [phone]

## 2021-05-13 NOTE — Progress Notes (Signed)
This chaplain responded to the consult for prayer and care for the care giver.  The Pt. is awake and responsive when the Pt. wife-[name] or caregiver- Anderson [place] encourages him.  The Pt. preferred name is "Robert [name]".  The chaplain listened to [facility] as she reflected on the last 24 hours.  The chaplain understands the Pt. and family are leaning on God's glory for the change they have seen in the Pt. health today.   The Pt. and family accepted the Pt. invitation for prayer and F/U spiritual care.

## 2021-05-13 NOTE — Evaluation (Addendum)
Occupational Therapy Evaluation Patient Details Name: Robert [name] MRN: [id number] DOB: 01-23-1946 Today's Date: 05/13/2021    History of Present Illness Robert [name] is a 75 y.o. male who presented for L transcarotid artery revascularization on 7/18 due to L carotid artery stenosis. After procedure on 7/18, pt noted with aphasia, R gaze preference, and R sided weakness. 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 Pt able to feed self, perform simple grooming tasks and was working on [employer] ambulation with PT. Pt presents now with continued mild confusion, L gaze preference and decreased attention to R side. Pt currently requires +2 physical assistance for all bed mobility, Stedy transfers and LB ADLs completed in [place]. Wife very supportive and hands on during session, interested in CIR prior to return home to maximize pt's physical abilities and reduce caregiver burden. Pt motivated to attempt all tasks and per wife, works hard with therapy. BP stable throughout.    Follow Up Recommendations  CIR (would benefit from SNF rehab if does not qualify for CIR)    Equipment Recommendations  None recommended by OT (appears well equipped)    Recommendations for Other Services Rehab consult     Precautions / Restrictions Precautions Precautions: Fall;Other (comment) Precaution Comments: L neck incision, hx of L hemiparesis Restrictions Weight Bearing Restrictions: No      Mobility Bed Mobility Overal bed mobility: Needs Assistance Bed Mobility: Supine to Sit;Sit to Supine     Supine to sit: Total assist;+2 for physical assistance;+2 for safety/equipment;HOB elevated Sit to supine: Total assist;+2 for physical assistance   General bed mobility comments: Total A x 2 to advance B LE and  lift/guide trunk    Transfers Overall transfer level: Needs assistance Equipment used: Ambulation equipment used Transfers: Sit to/from Stand Sit to Stand: Mod assist;+2 safety/equipment;+2 physical assistance;From elevated surface         General transfer comment: Overall Mod A x 2 for sit to stand from bed and BSC over toilet. Difficulty obtaining upright posture to move pads out of way, specifically R side    Balance Overall balance assessment: Needs assistance Sitting-balance support: Single extremity supported;Feet supported Sitting balance-Leahy Scale: Poor Sitting balance - Comments: initially reliant on external support and one UE support EOB but able to progress to close supervision   Standing balance support: Single extremity supported;During functional activity Standing balance-Leahy Scale: Poor Standing balance comment: reliant on UE support in [place] and external support                           ADL either performed or assessed with clinical judgement   ADL Overall ADL's : Needs assistance/impaired Eating/Feeding: Sitting;Moderate assistance Eating/Feeding Details (indicate cue type and reason): Based on functional abilities noted today. NPO until speech assessment Grooming: Moderate assistance;Sitting   Upper Body Bathing: Maximal assistance;Sitting   Lower Body Bathing: Total assistance;+2 for physical assistance;+2 for safety/equipment;Sit to/from stand   Upper Body Dressing : Maximal assistance;Sitting   Lower Body Dressing: Total assistance;+2 for safety/equipment;+2 for physical assistance;Bed level;Sit to/from stand Lower Body Dressing Details (indicate cue type and reason): +2 if in standing, wife assisted to don socks, shoes (L with AFO) Toilet Transfer: Moderate assistance;+2 for physical assistance;+2 for safety/equipment Toilet Transfer Details (indicate cue type  and reason): Mod A x 2 for sit to stand in [place] and transfer to [facility] over  toilet Toileting- Clothing Manipulation and Hygiene: Total assistance;+2 for physical assistance;+2 for safety/equipment;Sit to/from stand Toileting - Clothing Manipulation Details (indicate cue type and reason): Total A x 2 for sit to stand in [place]. Mod A to maintain standing while second person assists with peri care       General ADL Comments: Pt with noted L gaze preference, R sided inattention, mild confusion abnormal from baseline and requires increased assist for bed mobility, sit to stand transfers     Vision Patient Visual Report: Other (comment) (to be further assessed) Vision Assessment?: Vision impaired- to be further tested in functional context;Yes Alignment/Gaze Preference: Gaze left Additional Comments: L sided gaze preference, able to turn head to scan with cues. assumed midline off     Perception Perception Perception Tested?: Yes Perception Deficits: Inattention/neglect Inattention/Neglect: Does not attend to right visual field;Impaired- to be further tested in functional context Comments: Decreased attention/scanning to R side - requires cues to scan   Praxis      Pertinent Vitals/Pain Pain Assessment: No/denies pain     Hand Dominance Right   Extremity/Trunk Assessment Upper Extremity Assessment Upper Extremity Assessment: LUE deficits/detail LUE Deficits / Details: hx of hemiparesis, spasticity noted especially in digits and elbow. Minor deficits with passive wrist and elbow motion though also to be easily stretched LUE Sensation: decreased light touch LUE Coordination: decreased fine motor;decreased gross motor   Lower Extremity Assessment Lower Extremity Assessment: Defer to PT evaluation   Cervical / Trunk Assessment Cervical / Trunk Assessment: Kyphotic   Communication Communication Communication: HOH   Cognition Arousal/Alertness: Awake/alert Behavior During Therapy: Flat affect Overall Cognitive Status: Impaired/Different from baseline Area  of Impairment: Orientation;Attention;Memory;Following commands;Safety/judgement;Awareness;Problem solving                 Orientation Level: Disoriented to;Time;Situation Current Attention Level: Sustained Memory: Decreased short-term memory Following Commands: Follows one step commands inconsistently;Follows one step commands with increased time Safety/Judgement: Decreased awareness of deficits Awareness: Intellectual Problem Solving: Slow processing;Decreased initiation;Difficulty sequencing;Requires verbal cues General Comments: mild confusion noted though wife reports this is improved from yesterday's episode   General Comments  VSS on RA, BP stable    Exercises     Shoulder Instructions      Home Living Family/patient expects to be discharged to:: Private residence Living Arrangements: Spouse/significant other Available Help at Discharge: Family;Available 24 hours/day Type of Home: Apartment Home Access: Level entry     Home Layout: One level     Bathroom Shower/Tub: Teacher, early years/pre: Handicapped height (riser over toilet)     Home Equipment: Transport chair;Hospital bed;Tub bench;Bedside commode;Hand held shower head;Grab bars - tub/shower;Other (comment) (sit to stand transfer frame)   Additional Comments: Tub bench on rails that slides and swivels.  Pt's wife states that transferring pt into tub is very difficult due to pt freezing with inability to sit on command. Hospital bed with air mattress.  Transport chair. Hemi-walker      Prior Functioning/Environment Level of Independence: Needs assistance  Gait / Transfers Assistance Needed: wife assists pt into transport chair. non-ambulatory except for a couple of steps with assistance and hemiwalker.  Going to outpatient PT 2x/week. Uses sit to stand transfer frame with wife assist ADL's / Homemaking Assistance Needed: wife assists with all ADLs and IADLs aside from self feeding and simple  grooming tasks such as brushing teeth  OT Problem List: Decreased strength;Decreased range of motion;Decreased activity tolerance;Impaired balance (sitting and/or standing);Impaired vision/perception;Decreased coordination;Decreased cognition;Decreased knowledge of use of DME or AE;Impaired tone;Impaired UE functional use      OT Treatment/Interventions: Self-care/ADL training;Therapeutic exercise;Energy conservation;DME and/or AE instruction;Therapeutic activities;Cognitive remediation/compensation;Visual/perceptual remediation/compensation;Patient/family education;Balance training    OT Goals(Current goals can be found in the care plan section) Acute Rehab OT Goals Patient Stated Goal: would like for pt to have rehab at hospital prior to return home OT Goal Formulation: With patient/family Time For Goal Achievement: 05/27/21 Potential to Achieve Goals: Fair ADL Goals Pt Will Perform Eating: with modified independence;sitting Pt Will Perform Grooming: with modified independence;sitting Pt Will Transfer to Toilet: with min assist Pt/caregiver will Perform Home Exercise Program: Both right and left upper extremity;With Supervision;With written HEP provided Additional ADL Goal #1: Pt to demonstrate ability to scan to R side appropriately to identify ADL items in environment without verbal cues  OT Frequency: Min 2X/week   Barriers to D/C:            Co-evaluation              AM-PAC OT "6 Clicks" Daily Activity     Outcome Measure Help from another person eating meals?: A Lot Help from another person taking care of personal grooming?: A Lot Help from another person toileting, which includes using toliet, bedpan, or urinal?: Total Help from another person bathing (including washing, rinsing, drying)?: Total Help from another person to put on and taking off regular upper body clothing?: A Lot Help from another person to put on and taking off regular lower body  clothing?: Total 6 Click Score: 9   End of Session Equipment Utilized During Treatment: Gait belt;Other (comment) Charlaine Dalton) Nurse Communication: Mobility status  Activity Tolerance: Patient tolerated treatment well Patient left: in bed;with call bell/phone within reach;with family/visitor present;with nursing/sitter in room  OT Visit Diagnosis: Unsteadiness on feet (R26.81);Other abnormalities of gait and mobility (R26.89);Muscle weakness (generalized) (M62.81);Other symptoms and signs involving cognitive function;Hemiplegia and hemiparesis Hemiplegia - Right/Left: Left Hemiplegia - dominant/non-dominant: Non-Dominant Hemiplegia - caused by: Cerebral infarction                Time: 0755-0850 OT Time Calculation (min): 55 min Charges:  OT General Charges $OT Visit: 1 Visit OT Evaluation $OT Eval Moderate Complexity: 1 Mod OT Treatments $Self Care/Home Management : 23-37 mins $Therapeutic Activity: 8-22 mins  Malachy Chamber, OTR/L Acute Rehab Services Office: [phone]   Layla Maw 05/13/2021, 9:33 AM

## 2021-05-13 NOTE — Progress Notes (Signed)
Mobility Specialist: Progress Note   05/13/21 1200  Mobility  Activity Stood at bedside  Level of Assistance +2 (takes two people)  Assistive Device  (Hemi-walker)  Mobility  (Stood at bedside)  Mobility Response Tolerated fair  Mobility performed by Mobility specialist;Other (comment) (PT Robert Bellow)  $Mobility charge 1 Mobility   Pt required minA to stand at EOB with one person on each side. Pt attempted to progress gait but was only able to shuffle his R foot forward. Pt began to sit after standing for roughly 20 seconds and required verbal cues for posture. Pt unable to correct posture and needed to sit. Pt was assisted back to bed with PT present in the room.   [facility] Uel Davidow Mobility Specialist Mobility Specialist Phone: [phone]

## 2021-05-13 NOTE — Progress Notes (Signed)
Dr. Carlis Abbott ordered apixaban to be restarted but due to his recent CVA Dr Leonie Man would like to hold until after seeing MRI to avoid risk for hemorrhagic transformation.  Onnie Boer, PharmD, BCIDP, AAHIVP, CPP Infectious Disease Pharmacist 05/13/2021 9:18 AM

## 2021-05-14 ENCOUNTER — Inpatient Hospital Stay (HOSPITAL_COMMUNITY): Payer: Medicare Other

## 2021-05-14 ENCOUNTER — Encounter (HOSPITAL_COMMUNITY): Payer: Self-pay | Admitting: Vascular Surgery

## 2021-05-14 DIAGNOSIS — I6389 Other cerebral infarction: Secondary | ICD-10-CM

## 2021-05-14 DIAGNOSIS — I6522 Occlusion and stenosis of left carotid artery: Secondary | ICD-10-CM

## 2021-05-14 LAB — GLUCOSE, CAPILLARY
Glucose-Capillary: 146 mg/dL — ABNORMAL HIGH (ref 70–99)
Glucose-Capillary: 144 mg/dL — ABNORMAL HIGH (ref 70–99)
Glucose-Capillary: 160 mg/dL — ABNORMAL HIGH (ref 70–99)
Glucose-Capillary: 151 mg/dL — ABNORMAL HIGH (ref 70–99)

## 2021-05-14 LAB — ECHOCARDIOGRAM COMPLETE
Area-P 1/2: 3.19 cm2
Height: 74 in
S' Lateral: 2.8 cm
Weight: 3679.04 oz

## 2021-05-14 MED ORDER — APIXABAN 5 MG PO TABS
5.0000 mg | ORAL_TABLET | Freq: Two times a day (BID) | ORAL | Status: DC
  Administered 2021-05-14 (×2): 5 mg via ORAL
  Filled 2021-05-14 (×3): qty 1

## 2021-05-14 NOTE — TOC CAGE-AID Note (Signed)
Transition of Care [facility]) - CAGE-AID Screening   Patient Details  Name: Robert [name] MRN: [id number] Date of Birth: 07-Apr-1946  Transition of Care [facility]) CM/SW Contact:    Robert [name], [place] Phone Number: 05/14/2021, 2:30 PM   Clinical Narrative: Pt participated in [place].  Pt stated he does not use substance and/or ETOH.  Pt was not offered resources, due to no usage.    Robert [name], MSW, LCSW-A Pronouns:  She/Her/Hers [facility]itions of Care Clinical Social Worker Direct Number:  [phone] [email or web address]    CAGE-AID Screening:    Have You Ever Felt You Ought to Cut Down on Your Drinking or Drug Use?: No Have People Annoyed You By [employer] Your Drinking Or Drug Use?: No Have You Felt Bad Or Guilty About Your Drinking Or Drug Use?: No Have You Ever Had a Drink or Used Drugs First Thing In The Morning to Steady Your Nerves or to Get Rid of a Hangover?: No CAGE-AID Score: 0  Substance Abuse Education Offered: No

## 2021-05-14 NOTE — Progress Notes (Signed)
Echocardiogram 2D Echocardiogram has been performed.  Oneal Deputy Celestia Duva RDCS 05/14/2021, 11:46 AM

## 2021-05-14 NOTE — Progress Notes (Signed)
Inpatient Rehab Admissions Coordinator:   Met with patient and his spouse at the bedside to discuss recommendations for CIR, as well as explain goals and expectations.  Pt's spouse, [name], provides assist for ADLs and transfers at baseline.  Pt uses a transport chair for mobility due to size/weight of standard chair being too heavy for practicality.  Pt was receiving outpatient therapy prior to admission and had begun gait training with hemiwalker.  Wife agreeable to CIR admission and I reviewed medicare benefits with her.  Will follow for possible admission as early as tomorrow, if pt is medically ready.    Shann Medal, PT, DPT Admissions Coordinator [phone] 05/14/21  2:15 PM

## 2021-05-14 NOTE — Progress Notes (Addendum)
  Progress Note    05/14/2021 7:44 AM 2 Days Post-Op  Subjective:  says he feels good this morning  Afebrile HR 50's-90's NSR 295'M-841'L systolic 24% RA  Vitals:   05/14/21 0004 05/14/21 0422  BP: 125/76 124/67  Pulse: 84 77  Resp: 20 20  Temp: 98.1 F (36.7 C) 98.5 F (36.9 C)  SpO2: 94% 90%     Physical Exam: Neuro:  strong right hand grip.  Able to lift right leg off bed.  Lungs:  non labored Incision:  clean and dry; groin soft  CBC    Component Value Date/Time   WBC 12.1 (H) 05/13/2021 0210   RBC 4.77 05/13/2021 0210   HGB 14.1 05/13/2021 0210   HCT 42.3 05/13/2021 0210   PLT 311 05/13/2021 0210   MCV 88.7 05/13/2021 0210   MCH 29.6 05/13/2021 0210   MCHC 33.3 05/13/2021 0210   RDW 13.7 05/13/2021 0210   LYMPHSABS 1.0 01/06/2021 0336   MONOABS 1.6 (H) 01/06/2021 0336   EOSABS 0.5 01/06/2021 0336   BASOSABS 0.1 01/06/2021 0336    BMET    Component Value Date/Time   NA 138 05/13/2021 0210   K 4.0 05/13/2021 0210   CL 107 05/13/2021 0210   CO2 23 05/13/2021 0210   GLUCOSE 145 (H) 05/13/2021 0210   BUN 11 05/13/2021 0210   CREATININE 0.91 05/13/2021 0210   CALCIUM 8.9 05/13/2021 0210   GFRNONAA >60 05/13/2021 0210   GFRAA >60 09/28/2019 2052     Intake/Output Summary (Last 24 hours) at 05/14/2021 0744 Last data filed at 05/14/2021 4010 Gross per 24 hour  Intake --  Output 1050 ml  Net -1050 ml     Assessment/Plan:  This is a 75 y.o. male who is s/p left TCAR 2 Days Post-Op  -pt is doing well this am. -neuro exam improved from what previous notes describe.   -neuro to decide if pt needs long term [facility] -speech pathology recommending inpatient rehab-CIR consult already placed.   Leontine Locket, PA-C [facility] [phone]   I have seen and evaluated the patient. I agree with the PA note as documented above.  Postop day 2 status post left TCAR for an asymptomatic high-grade stenosis.  Unfortunately he had a small left  brain event and MRI confirmed small punctate stroke.  Fortunately he has made significant clinical improvement since Monday night when the code stroke was called.  On my evaluation he appears back at his neurologic baseline and has good strength in the right upper and lower extremity with no obvious speech issues.  Needs to continue aspirin Plavix for the carotid stent.  We will resume his Eliquis today after discussion with pharmacy.  This has been cleared by Dr. Leonie Man with neurology after MRI yesterday.  Appreciate neurology input.  Therapy is recommending CIR consult and this has been placed.  I think he has a very good prognosis looking at him today.  I have discussed with his wife after 1 month we can stop the Plavix and do aspirin Eliquis.  I do not have all his records but best I can understand he has had multiple lower extremity DVTs that have prompted the long-term anticoagulation managed by his PCP at the [place].  [name], MD [facility] [place] Office: [phone]

## 2021-05-14 NOTE — Progress Notes (Addendum)
Mobility Specialist: Progress Note   05/14/21 1719  Mobility  Activity  (To BR)  Level of Assistance +2 (takes two people)  Assistive Device None;Stedy  Distance Ambulated (ft)  --   Mobility Out of bed for toileting  Mobility Response Tolerated well  Mobility performed by Mobility specialist;Nurse  Bed Position  --   $Mobility charge 1 Mobility   Assisted RN in transferring pt to BR and then back to bed using the [place]. Pt required +2 modA to sit EOB as well as to stand. Pt is back in the bed with call bell at his side and family member present in the room.   [facility] Diesha Rostad Mobility Specialist Mobility Specialist Phone: [phone]

## 2021-05-14 NOTE — Progress Notes (Signed)
Due to several DVTs in the past. Dr. Carlis Abbott said ok to resume apixaban.  Onnie Boer, PharmD, BCIDP, AAHIVP, CPP Infectious Disease Pharmacist 05/14/2021 9:00 AM

## 2021-05-14 NOTE — Progress Notes (Signed)
STROKE TEAM PROGRESS NOTE    Interval History   Wife at bedside, states patient continues to improve with speech now back to baseline but still having mild right-sided weakness.  Wife is also wondering if patient needs to be on long-term anticoagulation as his episode of DVT was nearly 2 years ago.  He has not had any follow-up lower extremity venous Dopplers hence we will check 1 Therapist recommend inpatient rehab MRI scan shows tiny small scattered embolic left MCA infarcts and old remote injury large chronic right MCA infarct Pertinent Lab Work and Imaging    05/12/21 CT Head WO IV Contrast 1. Old right MCA territory infarct without acute intracranial abnormality. 2. ASPECTS is 10.  05/12/21 CT Angio Head and Neck W WO IV Contrast 1. Suspected occlusion of a left MCA M2 branch at a branch point in the Sylvian fissure.  2. 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 3. Patent left ICA stent. 4. Gas tracking superiorly along the course of the left sternocleidomastoid muscle, possibly recent central venous catheter placement attempt.  05/13/21 MRI Brain WO IV Contrast Tiny scattered left MCA acute infarcts.  Old large chronic right MCA infarct  05/13/21 Echocardiogram Complete  Ordered, results pending   Physical Examination   Constitutional: Calm, appropriate for condition  Cardiovascular: Normal RR Respiratory: No increased WOB   Mental status: Oriented to person, place, time not to situation speech is clear without any dysfluency, paraphasic errors. Speech: Fluent with repetition and naming intact No paraphasic errors.  Able to name and repeat well. Cranial nerves: EOMI, VFF, Tongue midline,  Motor: Normal bulk and tone. No drift.  Mild diminished fine finger movements on the right.  Orbits left over right upper extremity.  Trace weakness of right grip and right hip flexors only  Dlt Bic Tri FgS Grp HF  KnF KnE PIF DoF  R 4 4 4 4 4 3 3 3 3 3   L 2 2 2  0 0 2 2  2 2 2   Sensory: Intact to light touch throughout  Coordination: FNF intact RUE  Gait: Deferred due to BLE weakness     Assessment and Plan   Robert [name] is a 75 y.o. male w/pmh of  prior R MCA with residual L sided weakness, prior hx of DM2, GERD, HLD who was admitted and underwent LICA TCAR and stent placement for known L ICA 80% stenosis. Post op he developed L gaze preference along with expressive and receptive aphasia for which a stroke code was called. He was not candidate for IVTPA given recent surgery and NIR declined offering intervention given the risks of surgery outweighed benefits.   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 MRI Brain results and echo results are pending. Stroke labs w/LDL 38, A1C 6.4. Stroke is likely in the setting of LICA TCAR procedure with subsequent embolization.  -Continue Aspirin and Plavix for secondary stroke prevention, vascular surgery to decide duration  - Will decide on whether to resume [facility] pending MRI findings, on [facility] due to prior DVT but it was in October 2020 and question if patient really needs to continue to be on long-term anticoagulation -Continue Simvastatin 20 mg QD for secondary stroke  prevention  -Referral placed for stroke follow up at discharge   #Hypertension He has a history of HTN and takes Amlodipine 5 mg QD + Losartan 50 mg QD at home. Continue holding home blood pressure regimen at this time to allow for permissive hypertension, liberalizing pressures up to 170. - Continue permissive HTN   #Hyperlipidemia From a stroke prevention stand point, the LDL goal is < 70. LDL is at goal, continue home Simvastatin 20 mg QD   #DMII  Hemoglobin A1C this admission noted to be 6.4, at goal  from a stroke prevention standpoint. Recommend managing with SSI while inpatient, can resume outpatient diabetic regimen at discharge.   Hospital day # 2   Patient developed sudden onset of aphasia following TCAR procedure yesterday and was not given tPA due to recent surgery and thrombectomy due to poor baseline functioning and discussion with family and risk-benefit considered not to be in favor.  He fortunately seems to have improved and his aphasia is almost back to baseline.  MRI scan shows only tiny scattered left MCA and basal ganglia infarcts.  Patient may resume anticoagulation but I question if he really needs it since his episode of DVT was in October 2020.  Start aspirin for now till decision about long-term anticoagulation can be made after reviewing lower extremity venous Dopplers which I am ordering today..  Check echocardiogram results.  Mobilize out of bed.  Transfer to rehab when bed available.  Long discussion with patient and wife and answered questions.  Greater than 50% time during this 25-minute visit was spent in counseling and coordination of care and answering questions.  Antony Contras, MD Medical Director [facility] Pager: [phone] 05/14/2021 3:28 PM  To contact Stroke Continuity provider, please refer to [email or web address] After hours, contact General Neurology

## 2021-05-14 NOTE — Progress Notes (Signed)
Lower extremity venous has been completed.   Preliminary results in CV Proc.   Robert [name] 05/14/2021 4:17 PM

## 2021-05-14 NOTE — PMR Pre-admission (Signed)
PMR Admission Coordinator Pre-Admission Assessment  Patient: Robert [name] is an 75 y.o., male MRN: [id number] DOB: Oct 09, 1946 Height: $RemoveBefo'6\' 2"'bqSymUgsJNP$  (188 cm) Weight: 104.3 kg  Insurance Information HMO:     PPO:      PCP:      IPA:      80/20:      OTHER:  PRIMARY: Medicare A and B      Policy#: [id number]      Subscriber: pt CM Name:       Phone#:      Fax#:  Pre-Cert#: verified Civil engineer, contracting:  Benefits:  Phone #:      Name:  Eff. Date: 01/24/10 A and B     Deduct: $1556      Out of Pocket Max: n/a      Life Max: n/a CIR: 100%      SNF: 20 full days Outpatient: 80%     Co-Pay: 20% Home Health: 100%      Co-Pay:  DME: 80%     Co-Pay: 20% Providers: pt choice SECONDARY: [employer]      Policy#: [id number]     Phone#: [phone]  Financial Counselor:       Phone#:   The "Data Collection Information Summary" for patients in Inpatient Rehabilitation Facilities with attached "Privacy Act [place] Records" was provided and verbally reviewed with: Pt/Family  Emergency Contact Information Contact Information     Name Relation Home Work Mobile   Hinderman,[name] Spouse [phone]  [phone]       Current Medical History  Patient Admitting Diagnosis: CVA s/p TCAR  History of Present Illness: Pt is a 75 y/o male with PMH of HTN, hyperlipidemia, leg DVT, previous CVA with left hemiparesis 2008.  Presented to [facility] on 05/12/21 for planned TCAR on left with Dr. Carlis Abbott.  Post operatively pt noted to have altered mental status and R hemiparesis so code stroke was called.  CTA head/neck completed and showed L MCA branch occlusion.  Neurointerventional radiology believed risk of intervention higher than benefit and recommended conservative management.  Neurology consulted and reocmmended permissive HTN with SBP goal <170 and DBP <110.  Not felt to be a tPA candidate due to open carotid exposure.  Cognition and R hemiparesis improved.  Eliquis was restarted and Plavix for L carotid stent.   Therapy evaluations were completed and pt was recommended for CIR.   Complete NIHSS TOTAL: 11  Patient's medical record from [facility] has been reviewed by the rehabilitation admission coordinator and physician.  Past Medical History  Past Medical History:  Diagnosis Date   Anemia    BPH (benign prostatic hyperplasia)    CVA (cerebral vascular accident) ([place])    with left sided hemiparesis   Diabetes mellitus without complication ([place])    Diverticulosis    Frequency of urination    GERD (gastroesophageal reflux disease)    Gross hematuria    History of acute pyelonephritis    10-13-2012   History of CVA with residual deficit    2008--  left side of body weakness and foot drop (wears leg brace and uses cane)   History of DVT of lower extremity    2008--  cva   Hypercholesteremia    Hyperlipidemia    Hyperlipidemia    Hypertension    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 2008   Left leg DVT (HCC)    Neuromuscular disorder (HCC)    Parkinsons   S/P insertion  of IVC (inferior vena caval) filter    2008   Urethral stricture    Urgency of urination    Urinary retention    Weakness of left side of body    secondary to cva 2008   Wears glasses    Wears hearing aid    bilateral-- wears intermittantly    Family History   family history includes Alzheimer's disease in his father; Diabetes in his sister; Healthy in his son; Lung cancer in his brother; Other in his mother; Prostate cancer in his brother.  Prior Rehab/Hospitalizations Has the patient had prior rehab or hospitalizations prior to admission? Yes  Has the patient had major surgery during 100 days prior to admission? Yes   Current Medications  Current Facility-Administered Medications:    0.9 %  sodium chloride infusion, 500 mL, Intravenous, Once PRN, Rhyne, Samantha J, PA-C   0.9 %  sodium chloride infusion, , Intravenous, Continuous, Rhyne, Samantha J, PA-C, Last Rate: 100 mL/hr at 05/13/21 0320, New  Bag at 05/13/21 0320   acetaminophen (TYLENOL) tablet 325-650 mg, 325-650 mg, Oral, Q4H PRN **OR** acetaminophen (TYLENOL) suppository 325-650 mg, 325-650 mg, Rectal, Q4H PRN, Rhyne, Samantha J, PA-C   alum & mag hydroxide-simeth (MAALOX/MYLANTA) 200-200-20 MG/5ML suspension 15-30 mL, 15-30 mL, Oral, Q2H PRN, Rhyne, Samantha J, PA-C   aspirin EC tablet 81 mg, 81 mg, Oral, Daily, Rhyne, Samantha J, PA-C, 81 mg at 05/15/21 0913   bisacodyl (DULCOLAX) suppository 10 mg, 10 mg, Rectal, Daily PRN, Rhyne, Samantha J, PA-C   busPIRone (BUSPAR) tablet 7.5 mg, 7.5 mg, Oral, BID, Rhyne, Samantha J, PA-C, 7.5 mg at 05/15/21 0913   Carbidopa-Levodopa ER (SINEMET CR) 25-100 MG tablet controlled release 1 tablet, 1 tablet, Oral, Q24H, [name], MD, 1 tablet at 05/14/21 1741   Carbidopa-Levodopa ER (SINEMET CR) 25-100 MG tablet controlled release 2.5 tablet, 2.5 tablet, Oral, BID, Rhyne, Samantha J, PA-C, 2.5 tablet at 05/15/21 6045   Chlorhexidine Gluconate Cloth 2 % PADS 6 each, 6 each, Topical, Daily, [name], MD, 6 each at 05/15/21 0915   clopidogrel (PLAVIX) tablet 75 mg, 75 mg, Oral, Daily, Rhyne, Samantha J, PA-C, 75 mg at 05/15/21 0914   docusate sodium (COLACE) capsule 100 mg, 100 mg, Oral, Daily, Rhyne, Samantha J, PA-C, 100 mg at 05/15/21 0914   guaiFENesin-dextromethorphan (ROBITUSSIN DM) 100-10 MG/5ML syrup 15 mL, 15 mL, Oral, Q4H PRN, Rhyne, Samantha J, PA-C   hydrALAZINE (APRESOLINE) injection 5 mg, 5 mg, Intravenous, Q20 Min PRN, Rhyne, Samantha J, PA-C, 5 mg at 05/12/21 1849   hydrOXYzine (ATARAX/VISTARIL) tablet 10 mg, 10 mg, Oral, QHS PRN, Rhyne, Samantha J, PA-C   insulin aspart (novoLOG) injection 0-15 Units, 0-15 Units, Subcutaneous, TID WC, Rhyne, Samantha J, PA-C, 2 Units at 05/15/21 0711   labetalol (NORMODYNE) injection 10 mg, 10 mg, Intravenous, Q10 min PRN, Rhyne, Samantha J, PA-C, 10 mg at 05/12/21 1856   lidocaine (LIDODERM) 5 % 1 patch, 1 patch, Transdermal,  Daily PRN, Rhyne, Samantha J, PA-C   loratadine (CLARITIN) tablet 10 mg, 10 mg, Oral, Daily PRN, Rhyne, Samantha J, PA-C   magnesium sulfate IVPB 2 g 50 mL, 2 g, Intravenous, Daily PRN, Rhyne, Samantha J, PA-C   memantine (NAMENDA) tablet 20 mg, 20 mg, Oral, QHS, Rhyne, Samantha J, PA-C, 20 mg at 05/14/21 2257   metoprolol tartrate (LOPRESSOR) injection 2-5 mg, 2-5 mg, Intravenous, Q2H PRN, Rhyne, Samantha J, PA-C   morphine 2 MG/ML injection 2 mg, 2 mg, Intravenous, Q2H PRN,  Rhyne, Samantha J, PA-C   ondansetron (ZOFRAN) injection 4 mg, 4 mg, Intravenous, Q6H PRN, Rhyne, Samantha J, PA-C   oxyCODONE-acetaminophen (PERCOCET/ROXICET) 5-325 MG per tablet 1-2 tablet, 1-2 tablet, Oral, Q4H PRN, Rhyne, Samantha J, PA-C   pantoprazole (PROTONIX) EC tablet 40 mg, 40 mg, Oral, Daily, Rhyne, Samantha J, PA-C, 40 mg at 05/15/21 0914   phenol (CHLORASEPTIC) mouth spray 1 spray, 1 spray, Mouth/Throat, PRN, Rhyne, Samantha J, PA-C   Pimavanserin Tartrate CAPS 34 mg, 34 mg, Oral, Daily, Rhyne, Samantha J, PA-C, 34 mg at 05/15/21 1003   polyethylene glycol (MIRALAX / GLYCOLAX) packet 17 g, 17 g, Oral, Daily PRN, Rhyne, Samantha J, PA-C, 17 g at 05/14/21 1115   potassium chloride SA (KLOR-CON) CR tablet 20-40 mEq, 20-40 mEq, Oral, Daily PRN, Rhyne, Samantha J, PA-C   rasagiline (AZILECT) tablet 1 mg, 1 mg, Oral, Daily, Rhyne, Samantha J, PA-C, 1 mg at 05/15/21 0914   simvastatin (ZOCOR) tablet 20 mg, 20 mg, Oral, QPM, Rhyne, Samantha J, PA-C, 20 mg at 05/14/21 1741   tamsulosin (FLOMAX) capsule 0.4 mg, 0.4 mg, Oral, Daily, Rhyne, Samantha J, PA-C, 0.4 mg at 05/15/21 0913  Patients Current Diet:  Diet Order             DIET - DYS 1 Fluid consistency: Thin  Diet effective now                   Precautions / Restrictions Precautions Precautions: Fall Precaution Comments: L neck incision, hx of L hemiparesis Restrictions Weight Bearing Restrictions: No   Has the patient had 2 or more falls or a fall  with injury in the past year? No  Prior Activity Level Limited Community (1-2x/wk): was going out for therapy 2x/week with wife, transport chair for locomotion at home and in community, sit<>stand device for transfers with wife at baseline though working with therapy on ambulation with HW.  Wife assists with ADLs  Prior Functional Level Self Care: Did the patient need help bathing, dressing, using the toilet or eating? Needed some help  Indoor Mobility: Did the patient need assistance with walking from room to room (with or without device)? Dependent  Stairs: Did the patient need assistance with internal or external stairs (with or without device)? Dependent  Functional Cognition: Did the patient need help planning regular tasks such as shopping or remembering to take medications? Needed some help  Home Assistive Devices / Equipment Home Equipment: Transport chair, Hospital bed, Tub bench, Bedside commode, Hand held shower head, Grab bars - tub/shower, Other (comment)  Prior Device Use: Indicate devices/aids used by the patient prior to current illness, exacerbation or injury? Manual wheelchair, Orthotics/Prosthetics, and sit<>stand machine, TTB, BSC over toilet  Current Functional Level Cognition  Arousal/Alertness: Awake/alert Overall Cognitive Status: Difficult to assess (language difficulties) Current Attention Level: Sustained Orientation Level: Disoriented to place, Disoriented to situation Following Commands: Follows one step commands inconsistently, Follows one step commands with increased time Safety/Judgement: Decreased awareness of deficits General Comments: mild confusion noted though wife reports this is improved from yesterday's episode    Extremity Assessment (includes Sensation/Coordination)  Upper Extremity Assessment: LUE deficits/detail LUE Deficits / Details: hx of hemiparesis, spasticity noted especially in digits and elbow. Minor deficits with passive wrist and  elbow motion though also to be easily stretched LUE Sensation: decreased light touch LUE Coordination: decreased fine motor, decreased gross motor  Lower Extremity Assessment: LLE deficits/detail LLE Coordination: decreased gross motor    ADLs  Overall ADL's :  Needs assistance/impaired Eating/Feeding: Sitting, Moderate assistance Eating/Feeding Details (indicate cue type and reason): Based on functional abilities noted today. NPO until speech assessment Grooming: Moderate assistance, Sitting Upper Body Bathing: Maximal assistance, Sitting Lower Body Bathing: Total assistance, +2 for physical assistance, +2 for safety/equipment, Sit to/from stand Upper Body Dressing : Maximal assistance, Sitting Lower Body Dressing: Total assistance, +2 for safety/equipment, +2 for physical assistance, Bed level, Sit to/from stand Lower Body Dressing Details (indicate cue type and reason): +2 if in standing, wife assisted to don socks, shoes (L with AFO) Toilet Transfer: Moderate assistance, +2 for physical assistance, +2 for safety/equipment Toilet Transfer Details (indicate cue type and reason): Mod A x 2 for sit to stand in [place] and transfer to [facility] over toilet Toileting- Clothing Manipulation and Hygiene: Total assistance, +2 for physical assistance, +2 for safety/equipment, Sit to/from stand Toileting - Clothing Manipulation Details (indicate cue type and reason): Total A x 2 for sit to stand in [place]. Mod A to maintain standing while second person assists with peri care General ADL Comments: Pt with noted L gaze preference, R sided inattention, mild confusion abnormal from baseline and requires increased assist for bed mobility, sit to stand transfers    Mobility  Overal bed mobility: Needs Assistance Bed Mobility: Supine to Sit, Sit to Supine Supine to sit: Total assist, HOB elevated Sit to supine: Total assist, +2 for physical assistance General bed mobility comments: Total A x 2 to advance B LE and  lift/guide trunk    Transfers  Overall transfer level: Needs assistance Equipment used: Hemi-walker Transfer via Lift Equipment: St[place] Transfers: Sit to/from Stand Sit to Stand: From elevated surface, Min assist, +2 physical assistance General transfer comment: min assist +2 to stand from elevated bed.    Ambulation / Gait / Stairs / Wheelchair Mobility  Ambulation/Gait Ambulation/Gait assistance: Min assist, +2 physical assistance, Mod assist Gait Distance (Feet): 2 Feet Assistive device: Hemi-walker Gait Pattern/deviations: Step-to pattern, Decreased step length - right General Gait Details: patient able to take a step or two with right LE with hemiwalker and min/mod assist +2. After this patient began sinking with increased B Knee flexion in standing. Required max +2 to get back onto bed safely. Gait velocity: decr    Posture / Balance Dynamic Sitting Balance Sitting balance - Comments: able to sit with supervision Balance Overall balance assessment: Needs assistance Sitting-balance support: Feet supported Sitting balance-Leahy Scale: Fair Sitting balance - Comments: able to sit with supervision Standing balance support: Single extremity supported, During functional activity Standing balance-Leahy Scale: Fair Standing balance comment: reliant on UE support and external support    Special needs/care consideration Diabetic management yes   Previous Home Environment (from acute therapy documentation) Living Arrangements: Spouse/significant other Available Help at Discharge: Family, Available 24 hours/day Type of Home: Apartment Home Layout: One level Home Access: Level entry Bathroom Shower/Tub: Chiropodist: Handicapped height Additional Comments: Tub bench on rails that slides and swivels.  Pt's wife states that transferring pt into tub is very difficult due to pt freezing with inability to sit on command. Hospital bed with air mattress.  Transport chair.  Hemi-walker  Discharge Living Setting Plans for Discharge Living Setting: Patient's home, Lives with (comment) (wife, [name]) Type of Home at Discharge: Apartment Discharge Home Layout: One level Discharge Home Access: Level entry Discharge Bathroom Shower/Tub: Tub/shower unit Discharge Bathroom Toilet: Handicapped height Discharge Bathroom Accessibility: Yes How Accessible: Accessible via wheelchair Does the patient have any problems obtaining your medications?: No  Social/Family/Support Systems Patient Roles: Spouse Anticipated Caregiver: wife, [name] Anticipated Ambulance person Information: [phone] Ability/Limitations of Caregiver: n/a Caregiver Availability: 24/7 Discharge Plan Discussed with Primary Caregiver: Yes Is Caregiver In Agreement with Plan?: Yes Does Caregiver/Family have Issues with Lodging/Transportation while Pt is in Rehab?: No  Goals Patient/Family Goal for Rehab: PT/OT min assist w/c level, SLP min assist Expected length of stay: 10-14 days Pt/Family Agrees to Admission and willing to participate: Yes Program Orientation Provided & Reviewed with Pt/Caregiver Including Roles  & Responsibilities: Yes  Decrease burden of Care through IP rehab admission: Decrease number of caregivers for planned return home with spouse  Possible need for SNF placement upon discharge: no  Patient Condition: I have reviewed medical records from [facility], spoken with CM, and patient and spouse. I met with patient at the bedside for inpatient rehabilitation assessment.  Patient will benefit from ongoing PT, OT, and SLP, can actively participate in 3 hours of therapy a day 5 days of the week, and can make measurable gains during the admission.  Patient will also benefit from the coordinated team approach during an Inpatient Acute Rehabilitation admission.  The patient will receive intensive therapy as well as Rehabilitation physician, nursing, social worker, and care management  interventions.  Due to bladder management, safety, skin/wound care, disease management, medication administration, pain management, and patient education the patient requires 24 hour a day rehabilitation nursing.  The patient is currently min to max assist with mobility and max to total assist with basic ADLs.  Discharge setting and therapy post discharge at home with home health is anticipated.  Patient has agreed to participate in the Acute Inpatient Rehabilitation Program and will admit today.  Preadmission Screen Completed By:  Michel Santee, PT, DPT 05/15/2021 11:39 AM ______________________________________________________________________   Discussed status with Dr. Dagoberto Ligas on 05/15/21  at 11:39 AM  and received approval for admission today.  Admission Coordinator:  Michel Santee, PT, DPT time 11:39 AM Sudie Grumbling 05/15/21    Assessment/Plan: Diagnosis: Does the need for close, 24 hr/day Medical supervision in concert with the patient's rehab needs make it unreasonable for this patient to be served in a less intensive setting? Yes Co-Morbidities requiring supervision/potential complications: Previous stroke with L hemi and now R hemi due to L MCA stroke; s/p TCAR; confusion/AMS improving, but not resolved as well as aphasia; l foot drop;  Due to bladder management, bowel management, safety, skin/wound care, disease management, medication administration, and patient education, does the patient require 24 hr/day rehab nursing? Yes Does the patient require coordinated care of a physician, rehab nurse, PT, OT, and SLP to address physical and functional deficits in the context of the above medical diagnosis(es)? Yes Addressing deficits in the following areas: balance, endurance, locomotion, strength, transferring, bowel/bladder control, bathing, dressing, feeding, grooming, toileting, cognition, speech, language, and swallowing Can the patient actively participate in an intensive therapy program of at  least 3 hrs of therapy 5 days a week? Yes The potential for patient to make measurable gains while on inpatient rehab is fair Anticipated functional outcomes upon discharge from inpatient rehab: min assist PT, min assist OT, min assist SLP Estimated rehab length of stay to reach the above functional goals is: 10-14 days Anticipated discharge destination: Home 10. Overall Rehab/Functional Prognosis: fair   MD Signature:

## 2021-05-15 ENCOUNTER — Other Ambulatory Visit (HOSPITAL_COMMUNITY): Payer: Medicare Other

## 2021-05-15 ENCOUNTER — Other Ambulatory Visit: Payer: Self-pay

## 2021-05-15 ENCOUNTER — Inpatient Hospital Stay (HOSPITAL_COMMUNITY)
Admission: RE | Admit: 2021-05-15 | Discharge: 2021-05-28 | DRG: 057 | Disposition: A | Discharge status: Home Health | Payer: Medicare Other | Source: Intra-hospital | Attending: Physical Medicine & Rehabilitation | Admitting: Physical Medicine & Rehabilitation

## 2021-05-15 ENCOUNTER — Encounter (HOSPITAL_COMMUNITY): Payer: Self-pay | Admitting: Physical Medicine & Rehabilitation

## 2021-05-15 DIAGNOSIS — Z743 Need for continuous supervision: Secondary | ICD-10-CM | POA: Diagnosis not present

## 2021-05-15 DIAGNOSIS — R338 Other retention of urine: Secondary | ICD-10-CM | POA: Diagnosis present

## 2021-05-15 DIAGNOSIS — E1165 Type 2 diabetes mellitus with hyperglycemia: Secondary | ICD-10-CM | POA: Diagnosis present

## 2021-05-15 DIAGNOSIS — I69354 Hemiplegia and hemiparesis following cerebral infarction affecting left non-dominant side: Secondary | ICD-10-CM

## 2021-05-15 DIAGNOSIS — I639 Cerebral infarction, unspecified: Secondary | ICD-10-CM | POA: Diagnosis not present

## 2021-05-15 DIAGNOSIS — G8110 Spastic hemiplegia affecting unspecified side: Secondary | ICD-10-CM | POA: Diagnosis not present

## 2021-05-15 DIAGNOSIS — I69391 Dysphagia following cerebral infarction: Secondary | ICD-10-CM

## 2021-05-15 DIAGNOSIS — Z833 Family history of diabetes mellitus: Secondary | ICD-10-CM

## 2021-05-15 DIAGNOSIS — K219 Gastro-esophageal reflux disease without esophagitis: Secondary | ICD-10-CM | POA: Diagnosis present

## 2021-05-15 DIAGNOSIS — H6123 Impacted cerumen, bilateral: Secondary | ICD-10-CM | POA: Diagnosis present

## 2021-05-15 DIAGNOSIS — Z7901 Long term (current) use of anticoagulants: Secondary | ICD-10-CM | POA: Diagnosis not present

## 2021-05-15 DIAGNOSIS — E7800 Pure hypercholesterolemia, unspecified: Secondary | ICD-10-CM | POA: Diagnosis present

## 2021-05-15 DIAGNOSIS — R269 Unspecified abnormalities of gait and mobility: Secondary | ICD-10-CM

## 2021-05-15 DIAGNOSIS — N312 Flaccid neuropathic bladder, not elsewhere classified: Secondary | ICD-10-CM | POA: Diagnosis not present

## 2021-05-15 DIAGNOSIS — R1310 Dysphagia, unspecified: Secondary | ICD-10-CM | POA: Diagnosis present

## 2021-05-15 DIAGNOSIS — Z801 Family history of malignant neoplasm of trachea, bronchus and lung: Secondary | ICD-10-CM

## 2021-05-15 DIAGNOSIS — Z8042 Family history of malignant neoplasm of prostate: Secondary | ICD-10-CM | POA: Diagnosis not present

## 2021-05-15 DIAGNOSIS — K068 Other specified disorders of gingiva and edentulous alveolar ridge: Secondary | ICD-10-CM | POA: Diagnosis present

## 2021-05-15 DIAGNOSIS — N401 Enlarged prostate with lower urinary tract symptoms: Secondary | ICD-10-CM | POA: Diagnosis present

## 2021-05-15 DIAGNOSIS — I69351 Hemiplegia and hemiparesis following cerebral infarction affecting right dominant side: Secondary | ICD-10-CM | POA: Diagnosis not present

## 2021-05-15 DIAGNOSIS — I63412 Cerebral infarction due to embolism of left middle cerebral artery: Secondary | ICD-10-CM | POA: Diagnosis not present

## 2021-05-15 DIAGNOSIS — L600 Ingrowing nail: Secondary | ICD-10-CM | POA: Diagnosis present

## 2021-05-15 DIAGNOSIS — R1312 Dysphagia, oropharyngeal phase: Secondary | ICD-10-CM | POA: Diagnosis not present

## 2021-05-15 DIAGNOSIS — F0280 Dementia in other diseases classified elsewhere without behavioral disturbance: Secondary | ICD-10-CM | POA: Diagnosis present

## 2021-05-15 DIAGNOSIS — R339 Retention of urine, unspecified: Secondary | ICD-10-CM | POA: Diagnosis present

## 2021-05-15 DIAGNOSIS — G20 Parkinson's disease: Secondary | ICD-10-CM | POA: Diagnosis present

## 2021-05-15 DIAGNOSIS — Z8673 Personal history of transient ischemic attack (TIA), and cerebral infarction without residual deficits: Secondary | ICD-10-CM

## 2021-05-15 DIAGNOSIS — I10 Essential (primary) hypertension: Secondary | ICD-10-CM | POA: Diagnosis present

## 2021-05-15 DIAGNOSIS — Z87891 Personal history of nicotine dependence: Secondary | ICD-10-CM | POA: Diagnosis not present

## 2021-05-15 DIAGNOSIS — I63131 Cerebral infarction due to embolism of right carotid artery: Secondary | ICD-10-CM | POA: Diagnosis not present

## 2021-05-15 DIAGNOSIS — I469 Cardiac arrest, cause unspecified: Secondary | ICD-10-CM | POA: Diagnosis not present

## 2021-05-15 DIAGNOSIS — R5383 Other fatigue: Secondary | ICD-10-CM | POA: Diagnosis not present

## 2021-05-15 DIAGNOSIS — Z86718 Personal history of other venous thrombosis and embolism: Secondary | ICD-10-CM | POA: Diagnosis not present

## 2021-05-15 DIAGNOSIS — R5381 Other malaise: Secondary | ICD-10-CM | POA: Diagnosis not present

## 2021-05-15 DIAGNOSIS — R252 Cramp and spasm: Secondary | ICD-10-CM

## 2021-05-15 DIAGNOSIS — G8111 Spastic hemiplegia affecting right dominant side: Secondary | ICD-10-CM | POA: Diagnosis not present

## 2021-05-15 DIAGNOSIS — G8194 Hemiplegia, unspecified affecting left nondominant side: Secondary | ICD-10-CM | POA: Diagnosis not present

## 2021-05-15 DIAGNOSIS — G8190 Hemiplegia, unspecified affecting unspecified side: Secondary | ICD-10-CM | POA: Diagnosis not present

## 2021-05-15 DIAGNOSIS — I69398 Other sequelae of cerebral infarction: Secondary | ICD-10-CM

## 2021-05-15 LAB — GLUCOSE, CAPILLARY
Glucose-Capillary: 149 mg/dL — ABNORMAL HIGH (ref 70–99)
Glucose-Capillary: 154 mg/dL — ABNORMAL HIGH (ref 70–99)
Glucose-Capillary: 128 mg/dL — ABNORMAL HIGH (ref 70–99)
Glucose-Capillary: 153 mg/dL — ABNORMAL HIGH (ref 70–99)

## 2021-05-15 MED ORDER — CHLORHEXIDINE GLUCONATE CLOTH 2 % EX PADS
6.0000 | MEDICATED_PAD | Freq: Every day | CUTANEOUS | Status: DC
  Administered 2021-05-16 – 2021-05-27 (×12): 6 via TOPICAL

## 2021-05-15 MED ORDER — ACETAMINOPHEN 325 MG PO TABS: 325.0000 mg | ORAL_TABLET | ORAL | Status: DC | PRN

## 2021-05-15 MED ORDER — CARBIDOPA-LEVODOPA ER 25-100 MG PO TBCR
2.5000 | EXTENDED_RELEASE_TABLET | Freq: Two times a day (BID) | ORAL | Status: DC
  Administered 2021-05-16 – 2021-05-23 (×15): 2.5 via ORAL
  Filled 2021-05-15 (×18): qty 2.5

## 2021-05-15 MED ORDER — TRAZODONE HCL 50 MG PO TABS: 25.0000 mg | ORAL_TABLET | Freq: Every evening | ORAL | Status: DC | PRN

## 2021-05-15 MED ORDER — PROCHLORPERAZINE EDISYLATE 10 MG/2ML IJ SOLN: 5.0000 mg | Freq: Four times a day (QID) | INTRAMUSCULAR | Status: DC | PRN

## 2021-05-15 MED ORDER — BISACODYL 10 MG RE SUPP: 10.0000 mg | Freq: Every day | RECTAL | Status: DC | PRN

## 2021-05-15 MED ORDER — RASAGILINE MESYLATE 1 MG PO TABS
1.0000 mg | ORAL_TABLET | Freq: Every day | ORAL | Status: DC
  Administered 2021-05-16 – 2021-05-28 (×13): 1 mg via ORAL
  Filled 2021-05-15 (×14): qty 1

## 2021-05-15 MED ORDER — PROCHLORPERAZINE MALEATE 5 MG PO TABS
5.0000 mg | ORAL_TABLET | Freq: Four times a day (QID) | ORAL | Status: DC | PRN
  Administered 2021-05-16: 10 mg via ORAL
  Filled 2021-05-15: qty 2

## 2021-05-15 MED ORDER — APIXABAN 5 MG PO TABS
5.0000 mg | ORAL_TABLET | Freq: Two times a day (BID) | ORAL | Status: DC
  Administered 2021-05-15 – 2021-05-28 (×26): 5 mg via ORAL
  Filled 2021-05-15 (×26): qty 1

## 2021-05-15 MED ORDER — PIMAVANSERIN TARTRATE 34 MG PO CAPS
34.0000 mg | ORAL_CAPSULE | Freq: Every day | ORAL | Status: DC
  Administered 2021-05-16 – 2021-05-28 (×13): 34 mg via ORAL
  Filled 2021-05-15 (×14): qty 1

## 2021-05-15 MED ORDER — FLEET ENEMA 7-19 GM/118ML RE ENEM: 1.0000 | ENEMA | Freq: Once | RECTAL | Status: DC | PRN

## 2021-05-15 MED ORDER — CARBAMIDE PEROXIDE 6.5 % OT SOLN
5.0000 [drp] | Freq: Every day | OTIC | Status: DC
  Administered 2021-05-15 – 2021-05-22 (×8): 5 [drp] via OTIC
  Filled 2021-05-15: qty 15

## 2021-05-15 MED ORDER — ASPIRIN EC 81 MG PO TBEC
81.0000 mg | DELAYED_RELEASE_TABLET | Freq: Every day | ORAL | Status: DC
  Administered 2021-05-16 – 2021-05-28 (×13): 81 mg via ORAL
  Filled 2021-05-15 (×13): qty 1

## 2021-05-15 MED ORDER — DOCUSATE SODIUM 100 MG PO CAPS
100.0000 mg | ORAL_CAPSULE | Freq: Every day | ORAL | Status: DC
  Administered 2021-05-16 – 2021-05-26 (×11): 100 mg via ORAL
  Filled 2021-05-15 (×11): qty 1

## 2021-05-15 MED ORDER — GERHARDT'S BUTT CREAM
TOPICAL_CREAM | Freq: Three times a day (TID) | CUTANEOUS | Status: DC
  Administered 2021-05-17 – 2021-05-24 (×3): 1 via TOPICAL
  Filled 2021-05-15: qty 1

## 2021-05-15 MED ORDER — POLYETHYLENE GLYCOL 3350 17 G PO PACK
17.0000 g | PACK | Freq: Every day | ORAL | Status: DC | PRN
  Administered 2021-05-17: 17 g via ORAL
  Filled 2021-05-15 (×2): qty 1

## 2021-05-15 MED ORDER — BUSPIRONE HCL 15 MG PO TABS
7.5000 mg | ORAL_TABLET | Freq: Two times a day (BID) | ORAL | Status: DC
  Administered 2021-05-15 – 2021-05-28 (×26): 7.5 mg via ORAL
  Filled 2021-05-15 (×26): qty 1

## 2021-05-15 MED ORDER — APIXABAN 5 MG PO TABS: 5.0000 mg | ORAL_TABLET | Freq: Two times a day (BID) | ORAL | Status: DC

## 2021-05-15 MED ORDER — GUAIFENESIN-DM 100-10 MG/5ML PO SYRP
5.0000 mL | ORAL_SOLUTION | Freq: Four times a day (QID) | ORAL | Status: DC | PRN
  Filled 2021-05-15: qty 10

## 2021-05-15 MED ORDER — INSULIN ASPART 100 UNIT/ML IJ SOLN
0.0000 [IU] | Freq: Three times a day (TID) | INTRAMUSCULAR | Status: DC
Start: 2021-05-16 — End: 2021-05-28
  Administered 2021-05-16: 2 [IU] via SUBCUTANEOUS
  Administered 2021-05-16 – 2021-05-19 (×8): 1 [IU] via SUBCUTANEOUS
  Administered 2021-05-19: 2 [IU] via SUBCUTANEOUS
  Administered 2021-05-19 – 2021-05-20 (×4): 1 [IU] via SUBCUTANEOUS
  Administered 2021-05-21: 2 [IU] via SUBCUTANEOUS
  Administered 2021-05-21: 1 [IU] via SUBCUTANEOUS
  Administered 2021-05-22: 2 [IU] via SUBCUTANEOUS
  Administered 2021-05-22 – 2021-05-23 (×4): 1 [IU] via SUBCUTANEOUS
  Administered 2021-05-24: 3 [IU] via SUBCUTANEOUS
  Administered 2021-05-24 (×2): 1 [IU] via SUBCUTANEOUS
  Administered 2021-05-25 (×2): 2 [IU] via SUBCUTANEOUS
  Administered 2021-05-25 – 2021-05-27 (×5): 1 [IU] via SUBCUTANEOUS

## 2021-05-15 MED ORDER — LIDOCAINE 5 % EX PTCH: 1.0000 | MEDICATED_PATCH | Freq: Every day | CUTANEOUS | Status: DC | PRN

## 2021-05-15 MED ORDER — ORAL CARE MOUTH RINSE
15.0000 mL | Freq: Two times a day (BID) | OROMUCOSAL | Status: DC
  Administered 2021-05-15 – 2021-05-28 (×26): 15 mL via OROMUCOSAL

## 2021-05-15 MED ORDER — MEMANTINE HCL 10 MG PO TABS
20.0000 mg | ORAL_TABLET | Freq: Every day | ORAL | Status: DC
  Administered 2021-05-15 – 2021-05-27 (×13): 20 mg via ORAL
  Filled 2021-05-15 (×13): qty 2

## 2021-05-15 MED ORDER — METFORMIN HCL 500 MG PO TABS
500.0000 mg | ORAL_TABLET | Freq: Every day | ORAL | Status: DC
  Administered 2021-05-15 – 2021-05-27 (×13): 500 mg via ORAL
  Filled 2021-05-15 (×13): qty 1

## 2021-05-15 MED ORDER — DIPHENHYDRAMINE HCL 12.5 MG/5ML PO ELIX: 12.5000 mg | ORAL_SOLUTION | Freq: Four times a day (QID) | ORAL | Status: DC | PRN

## 2021-05-15 MED ORDER — CLOPIDOGREL BISULFATE 75 MG PO TABS
75.0000 mg | ORAL_TABLET | Freq: Every day | ORAL | Status: DC
  Administered 2021-05-16 – 2021-05-28 (×13): 75 mg via ORAL
  Filled 2021-05-15 (×13): qty 1

## 2021-05-15 MED ORDER — PROCHLORPERAZINE 25 MG RE SUPP
12.5000 mg | Freq: Four times a day (QID) | RECTAL | Status: DC | PRN
Start: 2021-05-15 — End: 2021-05-28

## 2021-05-15 MED ORDER — PANTOPRAZOLE SODIUM 40 MG PO TBEC
40.0000 mg | DELAYED_RELEASE_TABLET | Freq: Every day | ORAL | Status: DC
Start: 2021-05-16 — End: 2021-05-27
  Administered 2021-05-16 – 2021-05-27 (×12): 40 mg via ORAL
  Filled 2021-05-15 (×12): qty 1

## 2021-05-15 MED ORDER — HYDROXYZINE HCL 10 MG PO TABS: 10.0000 mg | ORAL_TABLET | Freq: Every evening | ORAL | Status: DC | PRN

## 2021-05-15 MED ORDER — INSULIN ASPART 100 UNIT/ML IJ SOLN: 0.0000 [IU] | Freq: Every day | INTRAMUSCULAR | Status: DC

## 2021-05-15 MED ORDER — ALUM & MAG HYDROXIDE-SIMETH 200-200-20 MG/5ML PO SUSP
30.0000 mL | ORAL | Status: DC | PRN
Start: 2021-05-15 — End: 2021-05-28

## 2021-05-15 MED ORDER — TAMSULOSIN HCL 0.4 MG PO CAPS
0.4000 mg | ORAL_CAPSULE | Freq: Every day | ORAL | Status: DC
  Administered 2021-05-16 – 2021-05-25 (×10): 0.4 mg via ORAL
  Filled 2021-05-15 (×10): qty 1

## 2021-05-15 MED ORDER — CARBIDOPA-LEVODOPA ER 25-100 MG PO TBCR
1.0000 | EXTENDED_RELEASE_TABLET | ORAL | Status: DC
  Administered 2021-05-15 – 2021-05-27 (×13): 1 via ORAL
  Filled 2021-05-15 (×15): qty 1

## 2021-05-15 MED ORDER — APIXABAN 5 MG PO TABS
5.0000 mg | ORAL_TABLET | Freq: Two times a day (BID) | ORAL | Status: DC
  Administered 2021-05-15: 5 mg via ORAL
  Filled 2021-05-15: qty 1

## 2021-05-15 MED ORDER — PHENOL 1.4 % MT LIQD: 1.0000 | OROMUCOSAL | Status: DC | PRN

## 2021-05-15 MED ORDER — SIMVASTATIN 20 MG PO TABS
20.0000 mg | ORAL_TABLET | Freq: Every evening | ORAL | Status: DC
  Administered 2021-05-15 – 2021-05-27 (×13): 20 mg via ORAL
  Filled 2021-05-15 (×13): qty 1

## 2021-05-15 MED ORDER — LORATADINE 10 MG PO TABS: 10.0000 mg | ORAL_TABLET | Freq: Every day | ORAL | Status: DC | PRN

## 2021-05-15 NOTE — Progress Notes (Addendum)
Progress Note    05/15/2021 7:41 AM 3 Days Post-Op  Subjective:  pt says he feels good this morning.  Wife in room and thankful scan was negative for blood clots  Afebrile HR 50's-80's  578'I-696'E systolic 95% RA  Vitals:   05/15/21 0359 05/15/21 0529  BP: 128/73 128/73  Pulse: 71 61  Resp: 17 18  Temp: 98.1 F (36.7 C) 98.1 F (36.7 C)  SpO2: 96% 95%    Physical Exam: General:  no distress Lungs:  non labored Incisions:  clean and dry Extremities:  strong right arm grip and able to lift leg off bed   CBC    Component Value Date/Time   WBC 12.1 (H) 05/13/2021 0210   RBC 4.77 05/13/2021 0210   HGB 14.1 05/13/2021 0210   HCT 42.3 05/13/2021 0210   PLT 311 05/13/2021 0210   MCV 88.7 05/13/2021 0210   MCH 29.6 05/13/2021 0210   MCHC 33.3 05/13/2021 0210   RDW 13.7 05/13/2021 0210   LYMPHSABS 1.0 01/06/2021 0336   MONOABS 1.6 (H) 01/06/2021 0336   EOSABS 0.5 01/06/2021 0336   BASOSABS 0.1 01/06/2021 0336    BMET    Component Value Date/Time   NA 138 05/13/2021 0210   K 4.0 05/13/2021 0210   CL 107 05/13/2021 0210   CO2 23 05/13/2021 0210   GLUCOSE 145 (H) 05/13/2021 0210   BUN 11 05/13/2021 0210   CREATININE 0.91 05/13/2021 0210   CALCIUM 8.9 05/13/2021 0210   GFRNONAA >60 05/13/2021 0210   GFRAA >60 09/28/2019 2052    INR    Component Value Date/Time   INR 1.2 05/08/2021 1130     Intake/Output Summary (Last 24 hours) at 05/15/2021 0741 Last data filed at 05/15/2021 0400 Gross per 24 hour  Intake 390 ml  Output 800 ml  Net -410 ml     Assessment/Plan:  75 y.o. male is s/p:  Left TCAR  3 Days Post-Op   -pt doing well this morning.  Continues to have strong right hand grip and able to lift leg off bed.  -BLE venous duplex study negative for DVT.  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  Will d/w Dr. Carlis Abbott if pt can discontinue Eliquis as wife is inquiring about this.  Discussed with wife that she can put his compression  socks on this morning.  -continue plavix/asa for carotid stent -possibly for dc to CIR today.   Leontine Locket, PA-C [facility] [phone] 05/15/2021 7:41 AM  I have seen and evaluated the patient. I agree with the PA note as documented above.  Postop day 3 status post left TCAR for an asymptomatic high-grade stenosis.  Had a small stroke on Monday night after waking up neurologically intact.  Appreciate neurology seeing him.  Appears to be back to his neurologic baseline.  I do not appreciate any speech deficit and he has excellent strength in the right upper and lower extremity.  I think he is medically stable for CIR and appreciate their evaluation.  In further discussion with the wife she now only recalls one DVT in the past not two.  Lower extremity duplexes ordered by Dr. Leonie Man yesterday showed no evidence of DVT.  I agree we can probably stop the Eliquis given only 1 DVT in the past with no recurrent events per the wife and family.  Continue aspirin Plavix statin for carotid stent.  [name], MD [facility] [place] Office: [phone]

## 2021-05-15 NOTE — Consult Note (Signed)
[facility] Nurse Consult Note: Patient receiving care in [place] Reason for Consult: skin breakdown with skin tears on bottom Wound type: MASD/IAD on bottom with one small wound that is a partial thickness, approx 1 x 1 and pink/moist.  Pressure Injury POA: NA Drainage (amount, consistency, odor) scant amount of brown/pink drainage on bed pad Periwound: Intact Dressing procedure/placement/frequency: Clean the sacral area with no rinse cleanser, pat dry and apply a generous coat of Desitin to the area. Apply PRN soiling.  Spouse has a tube of Desitin with her and does not want more ordered because she has so much of it at home.   ICD-10 CM Codes for Irritant Dermatitis L24A0 - Due to friction or contact with body fluids; unspecified  Monitor the wound area(s) for worsening of condition such as: Signs/symptoms of infection, increase in size, development of or worsening of odor, development of pain, or increased pain at the affected locations.   Notify the medical team if any of these develop.  Thank you for the consult. [facility] nurse will not follow at this time.   Please re-consult the [facility] team if needed.  Cathlean Marseilles Tamala Julian, MSN, RN, [place], [place]le Pearl, [facility] Wound Treatment Associate Pager [phone]

## 2021-05-15 NOTE — Progress Notes (Signed)
INPATIENT REHABILITATION ADMISSION NOTE   Arrival Method:bed     Mental Orientation:alert   Assessment:done   Skin:done with Hillary   IV'S:rt arm SL   Pain:none   Tubes and Drains:foley cath   Safety Measures:done   Vital Signs:done   Height and Weight:done   Rehab Orientation:done   Family:wife    Notes:

## 2021-05-15 NOTE — Progress Notes (Signed)
Inpatient Rehabilitation Medication Review by a Pharmacist  A complete drug regimen review was completed for this patient to identify any potential clinically significant medication issues.  Clinically significant medication issues were identified:  no  Check AMION for pharmacist assigned to patient if future medication questions/issues arise during this admission.  Pharmacist comments:   Time spent performing this drug regimen review (minutes):  [place] 05/15/2021 5:10 PM

## 2021-05-15 NOTE — Progress Notes (Signed)
PMR Admission Coordinator Pre-Admission Assessment   Patient: Robert [name] is an 75 y.o., male MRN: [id number] DOB: [birth year] Height: $RemoveBeforeDE'6\' 2"'[id number]$  (188 cm) Weight: 104.3 kg   Insurance Information HMO:     PPO:      PCP:      IPA:      80/20:      OTHER: PRIMARY: Medicare A and B      Policy#: [id number]      Subscriber: pt CM Name:       Phone#:      Fax#: Pre-Cert#: verified Civil engineer, contracting: Benefits:  Phone #:      Name: Eff. Date: 01/24/10 A and B     Deduct: $1556      Out of Pocket Max: n/a      Life Max: n/a CIR: 100%      SNF: 20 full days Outpatient: 80%     Co-Pay: 20% Home Health: 100%      Co-Pay: DME: 80%     Co-Pay: 20% Providers: pt choice SECONDARY: [employer]      Policy#: [id number]     Phone#: [phone]   Financial Counselor:       Phone#:   The "Data Collection Information Summary" for patients in Inpatient Rehabilitation Facilities with attached "Privacy Act [place] Records" was provided and verbally reviewed with: Pt/Family   Emergency Contact Information Contact Information       Name Relation Home Work Mobile    Gagan,[name] Spouse [phone]   [phone]           Current Medical History  Patient Admitting Diagnosis: CVA s/p TCAR   History of Present Illness: Pt is a 75 y/o male with PMH of HTN, hyperlipidemia, leg DVT, previous CVA with left hemiparesis 2008.  Presented to [facility] on 05/12/21 for planned TCAR on left with Dr. Carlis Abbott.  Post operatively pt noted to have altered mental status and R hemiparesis so code stroke was called.  CTA head/neck completed and showed L MCA branch occlusion.  Neurointerventional radiology believed risk of intervention higher than benefit and recommended conservative management.  Neurology consulted and reocmmended permissive HTN with SBP goal <170 and DBP <110.  Not felt to be a tPA candidate due to open carotid exposure.  Cognition and R hemiparesis improved.  Eliquis was restarted and Plavix for L carotid  stent.  Therapy evaluations were completed and pt was recommended for CIR.   Complete NIHSS TOTAL: 11   Patient's medical record from [facility] has been reviewed by the rehabilitation admission coordinator and physician.   Past Medical History      Past Medical History:  Diagnosis Date   Anemia     BPH (benign prostatic hyperplasia)     CVA (cerebral vascular accident) ([place])      with left sided hemiparesis   Diabetes mellitus without complication ([place])     Diverticulosis     Frequency of urination     GERD (gastroesophageal reflux disease)     Gross hematuria     History of acute pyelonephritis      10-13-2012   History of CVA with residual deficit      2008--  left side of body weakness and foot drop (wears leg brace and uses cane)   History of DVT of lower extremity      2008--  cva   Hypercholesteremia     Hyperlipidemia     Hyperlipidemia     Hypertension  Left foot drop      secondary to cva 2008   Left leg DVT (HCC)     Neuromuscular disorder (HCC)      Parkinsons   S/P insertion of IVC (inferior vena caval) filter      2008   Urethral stricture     Urgency of urination     Urinary retention     Weakness of left side of body      secondary to cva 2008   Wears glasses     Wears hearing aid      bilateral-- wears intermittantly      Family History   family history includes Alzheimer's disease in his father; Diabetes in his sister; Healthy in his son; Lung cancer in his brother; Other in his mother; Prostate cancer in his brother.   Prior Rehab/Hospitalizations Has the patient had prior rehab or hospitalizations prior to admission? Yes   Has the patient had major surgery during 100 days prior to admission? Yes              Current Medications   Current Facility-Administered Medications:   0.9 %  sodium chloride infusion, 500 mL, Intravenous, Once PRN, Rhyne, Samantha J, PA-C   0.9 %  sodium chloride infusion, , Intravenous, Continuous, Rhyne,  Samantha J, PA-C, Last Rate: 100 mL/hr at 05/13/21 0320, New Bag at 05/13/21 0320   acetaminophen (TYLENOL) tablet 325-650 mg, 325-650 mg, Oral, Q4H PRN **OR** acetaminophen (TYLENOL) suppository 325-650 mg, 325-650 mg, Rectal, Q4H PRN, Rhyne, Samantha J, PA-C   alum & mag hydroxide-simeth (MAALOX/MYLANTA) 200-200-20 MG/5ML suspension 15-30 mL, 15-30 mL, Oral, Q2H PRN, Rhyne, Samantha J, PA-C   aspirin EC tablet 81 mg, 81 mg, Oral, Daily, Rhyne, Samantha J, PA-C, 81 mg at 05/15/21 0913   bisacodyl (DULCOLAX) suppository 10 mg, 10 mg, Rectal, Daily PRN, Rhyne, Samantha J, PA-C   busPIRone (BUSPAR) tablet 7.5 mg, 7.5 mg, Oral, BID, Rhyne, Samantha J, PA-C, 7.5 mg at 05/15/21 0913   Carbidopa-Levodopa ER (SINEMET CR) 25-100 MG tablet controlled release 1 tablet, 1 tablet, Oral, Q24H, [name], MD, 1 tablet at 05/14/21 1741   Carbidopa-Levodopa ER (SINEMET CR) 25-100 MG tablet controlled release 2.5 tablet, 2.5 tablet, Oral, BID, Rhyne, Samantha J, PA-C, 2.5 tablet at 05/15/21 4097   Chlorhexidine Gluconate Cloth 2 % PADS 6 each, 6 each, Topical, Daily, [name], MD, 6 each at 05/15/21 0915   clopidogrel (PLAVIX) tablet 75 mg, 75 mg, Oral, Daily, Rhyne, Samantha J, PA-C, 75 mg at 05/15/21 0914   docusate sodium (COLACE) capsule 100 mg, 100 mg, Oral, Daily, Rhyne, Samantha J, PA-C, 100 mg at 05/15/21 0914   guaiFENesin-dextromethorphan (ROBITUSSIN DM) 100-10 MG/5ML syrup 15 mL, 15 mL, Oral, Q4H PRN, Rhyne, Samantha J, PA-C   hydrALAZINE (APRESOLINE) injection 5 mg, 5 mg, Intravenous, Q20 Min PRN, Rhyne, Samantha J, PA-C, 5 mg at 05/12/21 1849   hydrOXYzine (ATARAX/VISTARIL) tablet 10 mg, 10 mg, Oral, QHS PRN, Rhyne, Samantha J, PA-C   insulin aspart (novoLOG) injection 0-15 Units, 0-15 Units, Subcutaneous, TID WC, Rhyne, Samantha J, PA-C, 2 Units at 05/15/21 0711   labetalol (NORMODYNE) injection 10 mg, 10 mg, Intravenous, Q10 min PRN, Rhyne, Samantha J, PA-C, 10 mg at 05/12/21  1856   lidocaine (LIDODERM) 5 % 1 patch, 1 patch, Transdermal, Daily PRN, Rhyne, Samantha J, PA-C   loratadine (CLARITIN) tablet 10 mg, 10 mg, Oral, Daily PRN, Rhyne, Samantha J, PA-C   magnesium sulfate IVPB 2 g 50  mL, 2 g, Intravenous, Daily PRN, Rhyne, Samantha J, PA-C   memantine (NAMENDA) tablet 20 mg, 20 mg, Oral, QHS, Rhyne, Samantha J, PA-C, 20 mg at 05/14/21 2257   metoprolol tartrate (LOPRESSOR) injection 2-5 mg, 2-5 mg, Intravenous, Q2H PRN, Rhyne, Samantha J, PA-C   morphine 2 MG/ML injection 2 mg, 2 mg, Intravenous, Q2H PRN, Rhyne, Samantha J, PA-C   ondansetron (ZOFRAN) injection 4 mg, 4 mg, Intravenous, Q6H PRN, Rhyne, Samantha J, PA-C   oxyCODONE-acetaminophen (PERCOCET/ROXICET) 5-325 MG per tablet 1-2 tablet, 1-2 tablet, Oral, Q4H PRN, Rhyne, Samantha J, PA-C   pantoprazole (PROTONIX) EC tablet 40 mg, 40 mg, Oral, Daily, Rhyne, Samantha J, PA-C, 40 mg at 05/15/21 0914   phenol (CHLORASEPTIC) mouth spray 1 spray, 1 spray, Mouth/Throat, PRN, Rhyne, Samantha J, PA-C   Pimavanserin Tartrate CAPS 34 mg, 34 mg, Oral, Daily, Rhyne, Samantha J, PA-C, 34 mg at 05/15/21 1003   polyethylene glycol (MIRALAX / GLYCOLAX) packet 17 g, 17 g, Oral, Daily PRN, Rhyne, Samantha J, PA-C, 17 g at 05/14/21 1115   potassium chloride SA (KLOR-CON) CR tablet 20-40 mEq, 20-40 mEq, Oral, Daily PRN, Rhyne, Samantha J, PA-C   rasagiline (AZILECT) tablet 1 mg, 1 mg, Oral, Daily, Rhyne, Samantha J, PA-C, 1 mg at 05/15/21 0914   simvastatin (ZOCOR) tablet 20 mg, 20 mg, Oral, QPM, Rhyne, Samantha J, PA-C, 20 mg at 05/14/21 1741   tamsulosin (FLOMAX) capsule 0.4 mg, 0.4 mg, Oral, Daily, Rhyne, Samantha J, PA-C, 0.4 mg at 05/15/21 0913   Patients Current Diet:  Diet Order                  DIET - DYS 1 Fluid consistency: Thin  Diet effective now                         Precautions / Restrictions Precautions Precautions: Fall Precaution Comments: L neck incision, hx of L  hemiparesis Restrictions Weight Bearing Restrictions: No    Has the patient had 2 or more falls or a fall with injury in the past year? No   Prior Activity Level Limited Community (1-2x/wk): was going out for therapy 2x/week with wife, transport chair for locomotion at home and in community, sit<>stand device for transfers with wife at baseline though working with therapy on ambulation with HW.  Wife assists with ADLs   Prior Functional Level Self Care: Did the patient need help bathing, dressing, using the toilet or eating? Needed some help   Indoor Mobility: Did the patient need assistance with walking from room to room (with or without device)? Dependent   Stairs: Did the patient need assistance with internal or external stairs (with or without device)? Dependent   Functional Cognition: Did the patient need help planning regular tasks such as shopping or remembering to take medications? Needed some help   Home Assistive Devices / Equipment Home Equipment: Transport chair, Hospital bed, Tub bench, Bedside commode, Hand held shower head, Grab bars - tub/shower, Other (comment)   Prior Device Use: Indicate devices/aids used by the patient prior to current illness, exacerbation or injury? Manual wheelchair, Orthotics/Prosthetics, and sit<>stand machine, TTB, BSC over toilet   Current Functional Level Cognition   Arousal/Alertness: Awake/alert Overall Cognitive Status: Difficult to assess (language difficulties) Current Attention Level: Sustained Orientation Level: Disoriented to place, Disoriented to situation Following Commands: Follows one step commands inconsistently, Follows one step commands with increased time Safety/Judgement: Decreased awareness of deficits General Comments: mild confusion noted though  wife reports this is improved from yesterday's episode    Extremity Assessment (includes Sensation/Coordination)   Upper Extremity Assessment: LUE deficits/detail LUE  Deficits / Details: hx of hemiparesis, spasticity noted especially in digits and elbow. Minor deficits with passive wrist and elbow motion though also to be easily stretched LUE Sensation: decreased light touch LUE Coordination: decreased fine motor, decreased gross motor  Lower Extremity Assessment: LLE deficits/detail LLE Coordination: decreased gross motor     ADLs   Overall ADL's : Needs assistance/impaired Eating/Feeding: Sitting, Moderate assistance Eating/Feeding Details (indicate cue type and reason): Based on functional abilities noted today. NPO until speech assessment Grooming: Moderate assistance, Sitting Upper Body Bathing: Maximal assistance, Sitting Lower Body Bathing: Total assistance, +2 for physical assistance, +2 for safety/equipment, Sit to/from stand Upper Body Dressing : Maximal assistance, Sitting Lower Body Dressing: Total assistance, +2 for safety/equipment, +2 for physical assistance, Bed level, Sit to/from stand Lower Body Dressing Details (indicate cue type and reason): +2 if in standing, wife assisted to don socks, shoes (L with AFO) Toilet Transfer: Moderate assistance, +2 for physical assistance, +2 for safety/equipment Toilet Transfer Details (indicate cue type and reason): Mod A x 2 for sit to stand in [place] and transfer to [facility] over toilet Toileting- Clothing Manipulation and Hygiene: Total assistance, +2 for physical assistance, +2 for safety/equipment, Sit to/from stand Toileting - Clothing Manipulation Details (indicate cue type and reason): Total A x 2 for sit to stand in [place]. Mod A to maintain standing while second person assists with peri care General ADL Comments: Pt with noted L gaze preference, R sided inattention, mild confusion abnormal from baseline and requires increased assist for bed mobility, sit to stand transfers     Mobility   Overal bed mobility: Needs Assistance Bed Mobility: Supine to Sit, Sit to Supine Supine to sit: Total assist, HOB  elevated Sit to supine: Total assist, +2 for physical assistance General bed mobility comments: Total A x 2 to advance B LE and lift/guide trunk     Transfers   Overall transfer level: Needs assistance Equipment used: Hemi-walker Transfer via Lift Equipment: Stedy Transfers: Sit to/from Stand Sit to Stand: From elevated surface, Min assist, +2 physical assistance General transfer comment: min assist +2 to stand from elevated bed.     Ambulation / Gait / Stairs / Wheelchair Mobility   Ambulation/Gait Ambulation/Gait assistance: Min assist, +2 physical assistance, Mod assist Gait Distance (Feet): 2 Feet Assistive device: Hemi-walker Gait Pattern/deviations: Step-to pattern, Decreased step length - right General Gait Details: patient able to take a step or two with right LE with hemiwalker and min/mod assist +2. After this patient began sinking with increased B Knee flexion in standing. Required max +2 to get back onto bed safely. Gait velocity: decr     Posture / Balance Dynamic Sitting Balance Sitting balance - Comments: able to sit with supervision Balance Overall balance assessment: Needs assistance Sitting-balance support: Feet supported Sitting balance-Leahy Scale: Fair Sitting balance - Comments: able to sit with supervision Standing balance support: Single extremity supported, During functional activity Standing balance-Leahy Scale: Fair Standing balance comment: reliant on UE support and external support     Special needs/care consideration Diabetic management yes    Previous Home Environment (from acute therapy documentation) Living Arrangements: Spouse/significant other Available Help at Discharge: Family, Available 24 hours/day Type of Home: Apartment Home Layout: One level Home Access: Level entry Bathroom Shower/Tub: Chiropodist: Handicapped height Additional Comments: Tub bench on rails that slides and  swivels.  Pt's wife states that  transferring pt into tub is very difficult due to pt freezing with inability to sit on command. Hospital bed with air mattress.  Transport chair. Hemi-walker   Discharge Living Setting Plans for Discharge Living Setting: Patient's home, Lives with (comment) (wife, [name]) Type of Home at Discharge: Apartment Discharge Home Layout: One level Discharge Home Access: Level entry Discharge Bathroom Shower/Tub: Tub/shower unit Discharge Bathroom Toilet: Handicapped height Discharge Bathroom Accessibility: Yes How Accessible: Accessible via wheelchair Does the patient have any problems obtaining your medications?: No   Social/Family/Support Systems Patient Roles: Spouse Anticipated Caregiver: wife, [name] Anticipated Ambulance person Information: [phone] Ability/Limitations of Caregiver: n/a Caregiver Availability: 24/7 Discharge Plan Discussed with Primary Caregiver: Yes Is Caregiver In Agreement with Plan?: Yes Does Caregiver/Family have Issues with Lodging/Transportation while Pt is in Rehab?: No   Goals Patient/Family Goal for Rehab: PT/OT min assist w/c level, SLP min assist Expected length of stay: 10-14 days Pt/Family Agrees to Admission and willing to participate: Yes Program Orientation Provided & Reviewed with Pt/Caregiver Including Roles  & Responsibilities: Yes   Decrease burden of Care through IP rehab admission: Decrease number of caregivers for planned return home with spouse   Possible need for SNF placement upon discharge: no   Patient Condition: I have reviewed medical records from [facility], spoken with CM, and patient and spouse. I met with patient at the bedside for inpatient rehabilitation assessment.  Patient will benefit from ongoing PT, OT, and SLP, can actively participate in 3 hours of therapy a day 5 days of the week, and can make measurable gains during the admission.  Patient will also benefit from the coordinated team approach during an Inpatient Acute  Rehabilitation admission.  The patient will receive intensive therapy as well as Rehabilitation physician, nursing, social worker, and care management interventions.  Due to bladder management, safety, skin/wound care, disease management, medication administration, pain management, and patient education the patient requires 24 hour a day rehabilitation nursing.  The patient is currently min to max assist with mobility and max to total assist with basic ADLs.  Discharge setting and therapy post discharge at home with home health is anticipated.  Patient has agreed to participate in the Acute Inpatient Rehabilitation Program and will admit today.   Preadmission Screen Completed By:  Michel Santee, PT, DPT 05/15/2021 11:39 AM ______________________________________________________________________   Discussed status with Dr. Dagoberto Ligas on 05/15/21  at 11:39 AM  and received approval for admission today.   Admission Coordinator:  Michel Santee, PT, DPT time 11:39 AM Sudie Grumbling 05/15/21     Assessment/Plan: Diagnosis: Does the need for close, 24 hr/day Medical supervision in concert with the patient's rehab needs make it unreasonable for this patient to be served in a less intensive setting? Yes Co-Morbidities requiring supervision/potential complications: Previous stroke with L hemi and now R hemi due to L MCA stroke; s/p TCAR; confusion/AMS improving, but not resolved as well as aphasia; l foot drop;  Due to bladder management, bowel management, safety, skin/wound care, disease management, medication administration, and patient education, does the patient require 24 hr/day rehab nursing? Yes Does the patient require coordinated care of a physician, rehab nurse, PT, OT, and SLP to address physical and functional deficits in the context of the above medical diagnosis(es)? Yes Addressing deficits in the following areas: balance, endurance, locomotion, strength, transferring, bowel/bladder control, bathing,  dressing, feeding, grooming, toileting, cognition, speech, language, and swallowing Can the patient actively participate in an intensive therapy  program of at least 3 hrs of therapy 5 days a week? Yes The potential for patient to make measurable gains while on inpatient rehab is fair Anticipated functional outcomes upon discharge from inpatient rehab: min assist PT, min assist OT, min assist SLP Estimated rehab length of stay to reach the above functional goals is: 10-14 days Anticipated discharge destination: Home 10. Overall Rehab/Functional Prognosis: fair     MD Signature:

## 2021-05-15 NOTE — Progress Notes (Signed)
Talked with RN taking care of pt and she will reprint AVS and DCS given change with adding Eliquis back to pt's regimen.   Leontine Locket, [facility] 05/15/2021 1:32 PM

## 2021-05-15 NOTE — Progress Notes (Signed)
Inpatient Rehab Admissions Coordinator:   I have a bed for this patient to admit to CIR today.  Will let pt/family know.  TOC aware.    Shann Medal, PT, DPT Admissions Coordinator [phone] 05/15/21  11:28 AM

## 2021-05-15 NOTE — H&P (Signed)
Physical Medicine and Rehabilitation Admission H&P     CC: Stroke with functional deficits.     HPI: Robert [name] is a 75 year old male with history HTN, DVT,  prior CVA with residual left hemiparesis 2008, Parkinson's disease with gait disorder, chronic dysphagia (tends to hold prior to swallow since CVA 2008), urinary retention w/chronic Foley, left carotid stenosis who was admitted on 05/12/2021 for left transcarotid artery revascularization by Dr. Carlis Abbott.  On the evening evening postprocedure, he was noted to have confusion with right-sided weakness, left gaze preference and speech deficits.  tPA not offered due to recent TCAR and cerebral angiogram deferred as risks outweigh benefits.  MRI brain done showing scattered small embolic infarcts in left MCA territory and large chronic right MCA infarct.  Dr. Leonie Man felt that stroke likely a complication of TCAR procedure and to continue DAPT with question need for [facility].  He also recommended permissive hypertension with liberalizing blood pressures up to 170.     Wife had questions about need to continue Eliquis with history of only one DVT in the past therefore BLE Dopplers repeated today and were negative for DVT.  Dr. Leonie Man and Dr. Carlis Abbott has discussed this with wife and felt that Eliquis could be discontinued.  He continues on Eliquis as well as liquid diet. He is to continue on DAPT for secondary stroke prevention.  Patient's verbal output is improving but he continues to have expressive difficulties with right inattention and right-sided weakness has resolved.  Therapy evaluations revealed weakness with balance deficits and left hemiplegia affecting functional status.  CIR was recommended due to functional decline.   Pt has B/L buttock skin tears, NOT pressure ulcers- per wife since came to hospital. LBM yesterday- with miralax. Also has chronic foley- last changed 7/13- doesn't usually get clogged. Has an occ cough when laying down- improved  per pt/wife if sitting up. Ears impacted with wax- wants ear drops to help- also has hearing aid- uses on L side.      Review of Systems Constitutional:  Negative for chills and fever. HENT:  Positive for hearing loss.   Eyes:  Negative for blurred vision and double vision. Respiratory:  Negative for shortness of breath.   Cardiovascular:  Positive for leg swelling. Negative for chest pain and palpitations. Gastrointestinal:  Negative for constipation, heartburn and nausea. Genitourinary:        Chronic foley--followed by Dr. Louis Meckel  Musculoskeletal:  Negative for falls, joint pain and myalgias. Neurological:  Positive for focal weakness (left hemiplegia since stroke). Negative for dizziness and headaches. Psychiatric/Behavioral:  The patient does not have insomnia.   All other systems reviewed and are negative.         Past Medical History:  Diagnosis Date   Anemia     BPH (benign prostatic hyperplasia)     CVA (cerebral vascular accident) ([place])      with left sided hemiparesis   Diabetes mellitus without complication ([place])     Diverticulosis     Frequency of urination     GERD (gastroesophageal reflux disease)     Gross hematuria     History of acute pyelonephritis      10-13-2012   History of CVA with residual deficit      2008--  left side of body weakness and foot drop (wears leg brace and uses cane)   History of DVT of lower extremity      2008--  cva   Hypercholesteremia  Hyperlipidemia     Hyperlipidemia     Hypertension     Left foot drop      secondary to cva 2008   Left leg DVT ([place])     Neuromuscular disorder (HCC)      Parkinsons   S/P insertion of IVC (inferior vena caval) filter      2008   Urethral stricture     Urgency of urination     Urinary retention     Weakness of left side of body      secondary to cva 2008   Wears glasses     Wears hearing aid      bilateral-- wears intermittantly           Past Surgical History:  Procedure  Laterality Date   CYSTOSCOPY WITH RETROGRADE URETHROGRAM N/A 10/23/2015    Procedure: CYSTOSCOPY WITH RETROGRADE URETHROGRAM;  Surgeon: [name], MD;  Location: [facility];  Service: Urology;  Laterality: N/A;   CYSTOSCOPY WITH URETHRAL DILATATION N/A 10/23/2015    Procedure: CYSTOSCOPY WITH URETHRAL BALLOON DILATATION;  Surgeon: [name], MD;  Location: [facility];  Service: Urology;  Laterality: N/A;  BALLOON DILATION     IVC FILTER PLACEMENT ([place] [facility])   2008   LAPAROSCOPIC CHOLECYSTECTOMY SINGLE PORT N/A 01/09/2021    Procedure: LAPAROSCOPIC CHOLECYSTECTOMY WITH IOC;  Surgeon: [name], MD;  Location: [facility];  Service: General;  Laterality: N/A;  90 MIN   TRANSCAROTID ARTERY REVASCULARIZATION  Left 05/12/2021    Procedure: LEFT TRANSCAROTID ARTERY REVASCULARIZATION;  Surgeon: [name], MD;  Location: [facility] OR;  Service: Vascular;  Laterality: Left;           Family History  Problem Relation Age of Onset   Diabetes Sister     Lung cancer Brother          Post 9/11 voluntary work in [employer]   Prostate [place]     Other Mother          passed away young- non medical   Alzheimer's disease Father     Healthy Son        Social History: Married. Wife uses transport chair in the home and STEDY for transfers and to get to bathroom--needs gait belt/CGA for walking a few steps.  Wife assists with all ADL tasks . He does most of his walking with [facility] twice a week. Marland Kitchen  He  reports that he quit smoking about 50 years ago. His smoking use included cigarettes. He has never used smokeless tobacco. He reports that he does not drink alcohol and does not use drugs. :        Allergies  Allergen Reactions   Propofol Other (See Comments)      "hiccups for weeks"     Lisinopril Cough            Medications Prior to Admission  Medication Sig Dispense Refill   aspirin EC 81 MG tablet Take 81 mg by mouth daily. Swallow whole.        busPIRone (BUSPAR) 7.5 MG tablet Take 7.5 mg by mouth 2 (two) times daily.       Carbidopa-Levodopa ER (SINEMET CR) 25-100 MG tablet controlled release TAKE TWO TABLETS BY MOUTH THREE TIMES DAILY  (Patient taking differently: Take 1-2.5 tablets by mouth See admin instructions. Takes 2.5 tablets in the 9am, 2.5 tablets in the 1 pm, and 1 tablet at 5pm) 180 tablet 3   clopidogrel (PLAVIX)  75 MG tablet Take 1 tablet (75 mg total) by mouth daily. 30 tablet 5   hydrOXYzine (ATARAX/VISTARIL) 10 MG tablet Take 10 mg by mouth at bedtime as needed for sleep or itching.       lidocaine (LIDODERM) 5 % Place 1 patch onto the skin daily. Remove & Discard patch within 12 hours or as directed by MD (Patient taking differently: Place 1 patch onto the skin daily as needed (pain). Remove & Discard patch within 12 hours or as directed by MD) 14 patch 0   loratadine (CLARITIN) 10 MG tablet Take 10 mg by mouth daily as needed for allergies.       memantine (NAMENDA) 10 MG tablet Take 20 mg by mouth at bedtime.       metFORMIN (GLUCOPHAGE) 500 MG tablet Take 500 mg by mouth at bedtime.       NYAMYC powder Apply 1 application topically daily as needed (yeast).       Pimavanserin Tartrate (NUPLAZID) 34 MG CAPS Take 34 mg by mouth daily.       polyethylene glycol (MIRALAX) packet Take 17 g by mouth daily. (Patient taking differently: Take 17 g by mouth daily as needed for mild constipation.) 14 each 0   rasagiline (AZILECT) 1 MG TABS tablet Take 1 mg by mouth daily.       simvastatin (ZOCOR) 20 MG tablet Take 20 mg by mouth every evening.       tamsulosin (FLOMAX) 0.4 MG CAPS capsule Take 0.4 mg by mouth daily.       [DISCONTINUED] apixaban (ELIQUIS) 5 MG TABS tablet Take 1 tablet (5 mg total) by mouth 2 (two) times daily. 60 tablet 0   acetaminophen (TYLENOL) 500 MG tablet Take 1,000 mg by mouth every 6 (six) hours as needed for moderate pain.       amLODipine (NORVASC) 5 MG tablet Take 1 tablet (5 mg total) by mouth daily.  (Patient not taking: No sig reported)       bisacodyl (DULCOLAX) 5 MG EC tablet Take 1 tablet (5 mg total) by mouth daily as needed for moderate constipation. (Patient not taking: No sig reported) 30 tablet 0   losartan (COZAAR) 50 MG tablet Take 50 mg by mouth daily.       traZODone (DESYREL) 50 MG tablet Take 0.5 tablets (25 mg total) by mouth at bedtime as needed for sleep. (Patient not taking: No sig reported)          Drug Regimen Review  Drug regimen was reviewed and remains appropriate with no significant issues identified   Home: Home Living Family/patient expects to be discharged to:: Private residence Living Arrangements: Spouse/significant other Available Help at Discharge: Family, Available 24 hours/day Type of Home: Apartment Home Access: Level entry Home Layout: One level Bathroom Shower/Tub: Chiropodist: Handicapped height Home Equipment: Transport chair, Hospital bed, Tub bench, Bedside commode, Hand held shower head, Grab bars - tub/shower, Other (comment) Additional Comments: Tub bench on rails that slides and swivels.  Pt's wife states that transferring pt into tub is very difficult due to pt freezing with inability to sit on command. Hospital bed with air mattress.  Transport chair. Hemi-walker   Functional History: Prior Function Level of Independence: Needs assistance Gait / Transfers Assistance Needed: wife assists pt into transport chair. non-ambulatory except for a couple of steps with assistance and hemiwalker.  Going to outpatient PT 2x/week. Uses sit to stand transfer frame with wife assist ADL's / [employer]  Needed: wife assists with all ADLs and IADLs aside from self feeding and simple grooming tasks such as brushing teeth Comments: Wife assists with all ADLs.   Functional Status:  Mobility: Bed Mobility Overal bed mobility: Needs Assistance Bed Mobility: Supine to Sit, Sit to Supine Supine to sit: Total assist, HOB  elevated Sit to supine: Total assist, +2 for physical assistance General bed mobility comments: Total A x 2 to advance B LE and lift/guide trunk Transfers Overall transfer level: Needs assistance Equipment used: Hemi-walker Transfer via Lift Equipment: [place] Transfers: Sit to/from Stand Sit to Stand: From elevated surface, Min assist, +2 physical assistance General transfer comment: min assist +2 to stand from elevated bed. Ambulation/Gait Ambulation/Gait assistance: Min assist, +2 physical assistance, Mod assist Gait Distance (Feet): 2 Feet Assistive device: Hemi-walker Gait Pattern/deviations: Step-to pattern, Decreased step length - right General Gait Details: patient able to take a step or two with right LE with hemiwalker and min/mod assist +2. After this patient began sinking with increased B Knee flexion in standing. Required max +2 to get back onto bed safely. Gait velocity: decr   ADL: ADL Overall ADL's : Needs assistance/impaired Eating/Feeding: Sitting, Moderate assistance Eating/Feeding Details (indicate cue type and reason): Based on functional abilities noted today. NPO until speech assessment Grooming: Moderate assistance, Sitting Upper Body Bathing: Maximal assistance, Sitting Lower Body Bathing: Total assistance, +2 for physical assistance, +2 for safety/equipment, Sit to/from stand Upper Body Dressing : Maximal assistance, Sitting Lower Body Dressing: Total assistance, +2 for safety/equipment, +2 for physical assistance, Bed level, Sit to/from stand Lower Body Dressing Details (indicate cue type and reason): +2 if in standing, wife assisted to don socks, shoes (L with AFO) Toilet Transfer: Moderate assistance, +2 for physical assistance, +2 for safety/equipment Toilet Transfer Details (indicate cue type and reason): Mod A x 2 for sit to stand in [name] and transfer to [facility] over toilet Toileting- Clothing Manipulation and Hygiene: Total assistance, +2 for physical  assistance, +2 for safety/equipment, Sit to/from stand Toileting - Clothing Manipulation Details (indicate cue type and reason): Total A x 2 for sit to stand in [place]. Mod A to maintain standing while second person assists with peri care General ADL Comments: Pt with noted L gaze preference, R sided inattention, mild confusion abnormal from baseline and requires increased assist for bed mobility, sit to stand transfers   Cognition: Cognition Overall Cognitive Status: Difficult to assess (language difficulties) Arousal/Alertness: Awake/alert Orientation Level: Disoriented to place, Disoriented to situation Cognition Arousal/Alertness: Awake/alert Behavior During Therapy: Flat affect Overall Cognitive Status: Difficult to assess (language difficulties) Area of Impairment: Orientation, Attention, Memory, Following commands, Safety/judgement, Awareness, Problem solving Orientation Level: Disoriented to, Time, Situation Current Attention Level: Sustained Memory: Decreased short-term memory Following Commands: Follows one step commands inconsistently, Follows one step commands with increased time Safety/Judgement: Decreased awareness of deficits Awareness: Intellectual Problem Solving: Slow processing, Decreased initiation, Difficulty sequencing, Requires verbal cues General Comments: mild confusion noted though wife reports this is improved from yesterday's episode     Blood pressure 126/75, pulse 62, temperature 98.2 F (36.8 C), temperature source Oral, resp. rate 18, height $RemoveBe'6\' 2"'[id number]$  (1.88 m), weight 104.3 kg, SpO2 96 %. Physical Exam Vitals and nursing note reviewed. Exam conducted with a chaperone present. Constitutional:      Appearance: Normal appearance.    Comments: Pt sitting up in bed- masked facies/flat; wife and granddaughter at bedside; big elderly man; NAD; hard of hearing  HENT:    Head: Normocephalic and atraumatic.  Comments: Decreased hearing left greater than right. L  facial droop; tongue midline; no facial sensation changes    Right Ear: External ear normal. There is impacted cerumen.    Left Ear: External ear normal. There is impacted cerumen.    Nose: Nose normal. No congestion.    Mouth/Throat:    Mouth: Mucous membranes are dry.    Pharynx: Oropharynx is clear. No oropharyngeal exudate. Eyes:    General:        Right eye: No discharge.        Left eye: No discharge.    Extraocular Movements: Extraocular movements intact. Cardiovascular:    Rate and Rhythm: Normal rate and regular rhythm.    Heart sounds: Normal heart sounds. No murmur heard.   No gallop. Pulmonary:    Comments: CTA B/L- no W/R/R- good air movement     Abdominal:    Comments: Soft, NT, ND, (+)BS -hypoactive  Genitourinary:    Comments: Foley in place- chronic Musculoskeletal:    Cervical back: Normal range of motion and neck supple.    Comments: Was wearing knee-high support stockings and noted to have 1+ pedal edema bilaterally. RUE0 5-/5 LUE_ biceps 2-/5, Triceps 2-/5, grip 2-/5 -otherwise 0/5 RLE- 5-/5 LLE- HF 2-/5, KE 2/5, DF 0/5, PF 2-/5     Skin:    Comments: R AC fossa IV_ look ok; B/L inner buttocks skin tears seen- covered with desitin per wound care nurse- don't appear to be draining. L neck- glued- some dried blood- slightly swollen- from TCAR  Neurological:    Mental Status: He is alert and oriented to person, place, and time.    Comments: Masked facies.  Decreased hearing.  Has a left gaze preference and needs cues to attend to the right.  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  Intentional tremor RLE.  He was able to answer basic orientation question and follow simple motor commands with minimal cues.   Spasticity MAS of 3 in LUE; no significant spasticity seen in LLE- no clonus L inattention noted Ox3 - knew next holiday was Labor Day- with cues- no cues for basic orientation questions. Lacking 15-20 degrees of L elbow extension  due to spasticity    Psychiatric:    Comments: Masked facies, but otherwise, appears full affect      Lab Results Last 48 Hours        Results for orders placed or performed during the hospital encounter of 05/12/21 (from the past 48 hour(s))  Glucose, capillary     Status: Abnormal    Collection Time: 05/13/21 12:24 PM  Result Value Ref Range    Glucose-Capillary 146 (H) 70 - 99 mg/dL      Comment: Glucose reference range applies only to samples taken after fasting for at least 8 hours.  Glucose, capillary     Status: Abnormal    Collection Time: 05/13/21  4:20 PM  Result Value Ref Range    Glucose-Capillary 164 (H) 70 - 99 mg/dL      Comment: Glucose reference range applies only to samples taken after fasting for at least 8 hours.  Glucose, capillary     Status: Abnormal    Collection Time: 05/13/21  9:14 PM  Result Value Ref Range    Glucose-Capillary 184 (H) 70 - 99 mg/dL      Comment: Glucose reference range applies only to samples taken after fasting for at least 8 hours.  Glucose, capillary     Status: Abnormal  Collection Time: 05/14/21  6:13 AM  Result Value Ref Range    Glucose-Capillary 146 (H) 70 - 99 mg/dL      Comment: Glucose reference range applies only to samples taken after fasting for at least 8 hours.  Glucose, capillary     Status: Abnormal    Collection Time: 05/14/21 11:05 AM  Result Value Ref Range    Glucose-Capillary 144 (H) 70 - 99 mg/dL      Comment: Glucose reference range applies only to samples taken after fasting for at least 8 hours.  Glucose, capillary     Status: Abnormal    Collection Time: 05/14/21  4:11 PM  Result Value Ref Range    Glucose-Capillary 160 (H) 70 - 99 mg/dL      Comment: Glucose reference range applies only to samples taken after fasting for at least 8 hours.  Glucose, capillary     Status: Abnormal    Collection Time: 05/14/21  9:29 PM  Result Value Ref Range    Glucose-Capillary 151 (H) 70 - 99 mg/dL      Comment:  Glucose reference range applies only to samples taken after fasting for at least 8 hours.  Glucose, capillary     Status: Abnormal    Collection Time: 05/15/21  6:19 AM  Result Value Ref Range    Glucose-Capillary 149 (H) 70 - 99 mg/dL      Comment: Glucose reference range applies only to samples taken after fasting for at least 8 hours.       Imaging Results (Last 48 hours)  MR BRAIN WO CONTRAST   Result Date: 05/13/2021 CLINICAL DATA:  Acute onset of aphasia with left gaze preference and right-sided weakness following left ICA stenting. EXAM: MRI HEAD WITHOUT CONTRAST TECHNIQUE: Multiplanar, multiecho pulse sequences of the brain and surrounding structures were obtained without intravenous contrast. COMPARISON:  Head CT and CTA 05/12/2021.  Head MRI 04/23/2017. FINDINGS: Brain: There are several scattered acute subcentimeter infarcts involving primarily cortex of the left cerebral hemisphere (MCA territory) in the left temporo-occipital region, parietal lobe, and posterior frontal lobe with the largest measuring 5 mm in the parietal lobe. There is also a punctate acute infarct involving the left caudate nucleus. There is a large chronic right MCA infarct with associated ex vacuo dilatation of the right lateral ventricle, a small amount of chronic blood products, and wallerian degeneration extending into the brainstem. Small T2 hyperintensities elsewhere in the cerebral white matter bilaterally are nonspecific but compatible with mild chronic small vessel ischemic disease. No mass, midline shift, or extra-axial fluid collection is identified. Vascular: Abnormal appearance of the distal right ICA corresponding to known chronic occlusion. Skull and upper cervical spine: Unremarkable bone marrow signal. Sinuses/Orbits: Unremarkable orbits. Paranasal sinuses and mastoid air cells are clear. Other: 4 cm left suboccipital cyst, larger than in 2018. IMPRESSION: 1. Scattered small acute embolic left MCA  infarcts. 2. Large chronic right MCA infarct. Electronically Signed   By: Logan Bores M.D.   On: 05/13/2021 15:58   ECHOCARDIOGRAM COMPLETE   Result Date: 05/14/2021    ECHOCARDIOGRAM REPORT   Patient Name:   DYLIN BREEDEN Date of Exam: 05/14/2021 Medical Rec #:  [id number]      Height:       74.0 in Accession #:    [id number]     Weight:       229.9 lb Date of Birth:  08-06-1946      BSA:  2.306 m Patient Age:    24 years       BP:           126/78 mmHg Patient Gender: M              HR:           57 bpm. Exam Location:  Inpatient Procedure: 2D Echo, Color Doppler and Cardiac Doppler Indications:    Stroke i63.9  History:        Patient has prior history of Echocardiogram examinations, most                 recent 01/08/2021. Risk Factors:Hypertension, Diabetes,                 Dyslipidemia and Parkinson's.  Sonographer:    Raquel Sarna Senior RDCS Referring Phys: [id number] [facility]  Sonographer Comments: Unable to reposition patient due to restricted mobility, suboptimal apical window. IMPRESSIONS  1. Left ventricular ejection fraction, by estimation, is 55 to 60%. The left ventricle has normal function. The left ventricle has no regional wall motion abnormalities. There is mild left ventricular hypertrophy. Left ventricular diastolic parameters are consistent with Grade I diastolic dysfunction (impaired relaxation).  2. Right ventricular systolic function is normal. The right ventricular size is normal. Tricuspid regurgitation signal is inadequate for assessing PA pressure.  3. The mitral valve is normal in structure. No evidence of mitral valve regurgitation. No evidence of mitral stenosis.  4. The aortic valve is tricuspid. Aortic valve regurgitation is not visualized. Mild aortic valve sclerosis is present, with no evidence of aortic valve stenosis.  5. Aortic dilatation noted. There is mild dilatation of the ascending aorta, measuring 38 mm.  6. The inferior vena cava is dilated in size with <50%  respiratory variability, suggesting right atrial pressure of 15 mmHg. FINDINGS  Left Ventricle: Left ventricular ejection fraction, by estimation, is 55 to 60%. The left ventricle has normal function. The left ventricle has no regional wall motion abnormalities. The left ventricular internal cavity size was normal in size. There is  mild left ventricular hypertrophy. Left ventricular diastolic parameters are consistent with Grade I diastolic dysfunction (impaired relaxation). Right Ventricle: The right ventricular size is normal. No increase in right ventricular wall thickness. Right ventricular systolic function is normal. Tricuspid regurgitation signal is inadequate for assessing PA pressure. Left Atrium: Left atrial size was normal in size. Right Atrium: Right atrial size was normal in size. Pericardium: There is no evidence of pericardial effusion. Mitral Valve: The mitral valve is normal in structure. No evidence of mitral valve regurgitation. No evidence of mitral valve stenosis. Tricuspid Valve: The tricuspid valve is normal in structure. Tricuspid valve regurgitation is not demonstrated. Aortic Valve: The aortic valve is tricuspid. Aortic valve regurgitation is not visualized. Mild aortic valve sclerosis is present, with no evidence of aortic valve stenosis. Pulmonic Valve: The pulmonic valve was normal in structure. Pulmonic valve regurgitation is trivial. Aorta: The aortic root is normal in size and structure and aortic dilatation noted. There is mild dilatation of the ascending aorta, measuring 38 mm. Venous: The inferior vena cava is dilated in size with less than 50% respiratory variability, suggesting right atrial pressure of 15 mmHg. IAS/Shunts: No atrial level shunt detected by color flow Doppler.  LEFT VENTRICLE PLAX 2D LVIDd:         4.00 cm  Diastology LVIDs:         2.80 cm  LV e' medial:    8.92 cm/s LV  PW:         1.10 cm  LV E/e' medial:  8.5 LV IVS:        1.20 cm  LV e' lateral:   7.94 cm/s  LVOT diam:     2.00 cm  LV E/e' lateral: 9.6 LV SV:         69 LV SV Index:   30 LVOT Area:     3.14 cm  RIGHT VENTRICLE RV S prime:     13.40 cm/s TAPSE (M-mode): 1.8 cm LEFT ATRIUM             Index       RIGHT ATRIUM           Index LA diam:        3.10 cm 1.34 cm/m  RA Area:     15.40 cm LA Vol (A2C):   38.9 ml 16.87 ml/m RA Volume:   37.10 ml  16.09 ml/m LA Vol (A4C):   46.0 ml 19.95 ml/m LA Biplane Vol: 43.0 ml 18.65 ml/m  AORTIC VALVE LVOT Vmax:   105.00 cm/s LVOT Vmean:  72.800 cm/s LVOT VTI:    0.221 m  AORTA Ao Root diam: 3.60 cm Ao Asc diam:  3.80 cm MITRAL VALVE MV Area (PHT): 3.19 cm    SHUNTS MV Decel Time: 238 msec    Systemic VTI:  0.22 m MV E velocity: 76.00 cm/s  Systemic Diam: 2.00 cm MV A velocity: 73.40 cm/s MV E/A ratio:  1.04 Loralie Champagne MD Electronically signed by Loralie Champagne MD Signature Date/Time: 05/14/2021/4:33:28 PM    Final     VAS [place] LOWER EXTREMITY VENOUS (DVT)   Result Date: 05/14/2021  Lower Venous DVT Study Patient Name:  NAJIR ROOP  Date of Exam:   05/14/2021 Medical Rec #: [id number]       Accession #:    [id number] Date of Birth: Aug 18, 1946       Patient Gender: M Patient Age:   74Y Exam Location:  [facility] Procedure:      VAS [place] LOWER EXTREMITY VENOUS (DVT) Referring Phys: [id number] PRAMOD S SETHI --------------------------------------------------------------------------------  Indications: Superficial venous thrombosis.  Limitations: Poor ultrasound/tissue interface. Comparison Study: 08/15/19 prior Performing Technologist: Archie Patten RVS  Examination Guidelines: A complete evaluation includes B-mode imaging, spectral Doppler, color Doppler, and power Doppler as needed of all accessible portions of each vessel. Bilateral testing is considered an integral part of a complete examination. Limited examinations for reoccurring indications may be performed as noted. The reflux portion of the exam is performed with the patient in reverse Trendelenburg.   +---------+---------------+---------+-----------+----------+--------------+ RIGHT    CompressibilityPhasicitySpontaneityPropertiesThrombus Aging +---------+---------------+---------+-----------+----------+--------------+ CFV      Full           Yes      Yes                                 +---------+---------------+---------+-----------+----------+--------------+ SFJ      Full                                                        +---------+---------------+---------+-----------+----------+--------------+ FV Prox  Full                                                        +---------+---------------+---------+-----------+----------+--------------+  FV Mid   Full                                                        +---------+---------------+---------+-----------+----------+--------------+ FV DistalFull                                                        +---------+---------------+---------+-----------+----------+--------------+ PFV      Full                                                        +---------+---------------+---------+-----------+----------+--------------+ POP      Full           Yes      Yes                                 +---------+---------------+---------+-----------+----------+--------------+ PTV      Full                                                        +---------+---------------+---------+-----------+----------+--------------+ PERO     Full                                                        +---------+---------------+---------+-----------+----------+--------------+   +---------+---------------+---------+-----------+----------+--------------+ LEFT     CompressibilityPhasicitySpontaneityPropertiesThrombus Aging +---------+---------------+---------+-----------+----------+--------------+ CFV      Full           Yes      Yes                                  +---------+---------------+---------+-----------+----------+--------------+ SFJ      Full                                                        +---------+---------------+---------+-----------+----------+--------------+ FV Prox  Full                                                        +---------+---------------+---------+-----------+----------+--------------+ FV Mid                  Yes      Yes                                 +---------+---------------+---------+-----------+----------+--------------+  FV Distal               Yes                                          +---------+---------------+---------+-----------+----------+--------------+ PFV      Full                                                        +---------+---------------+---------+-----------+----------+--------------+ POP      Full           Yes      Yes                                 +---------+---------------+---------+-----------+----------+--------------+ PTV      Full                                                        +---------+---------------+---------+-----------+----------+--------------+ PERO     Full                                                        +---------+---------------+---------+-----------+----------+--------------+     Summary: BILATERAL: - No evidence of deep vein thrombosis seen in the lower extremities, bilaterally. -No evidence of popliteal cyst, bilaterally.   *See table(s) above for measurements and observations.    Preliminary              Medical Problem List and Plan: 1.  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  History of recurrent DVT/antithrombotics: -DVT/anticoagulation:  Pharmaceutical: Other (comment)--on Eliquis--will be on Eliquis lifelong after d/w Neuro and IM.               -antiplatelet therapy: DAPT 3. Pain Management: N/A 4. Mood: LCSW to follow for evaluation and support             -antipsychotic agents: On Pimavanserin daily 5. Neuropsych: This patient may not be fully capable of making decisions on his own behalf. 6. Skin/Wound Care: Treat/monitor MASD on sacrum and continue pressure relief measures.Also has 2 small skin tears- con't desitin              -- Monitor nutritional status and offer supplements as needed Po intake.   --Will add Juven twice daily to promote wound healing. 7. Fluids/Electrolytes/Nutrition: Monitor intake/output.  Check c-Met in AM. 8.  T2DM: Resume metformin as > 48 hours since dye procedure. -- Will monitor blood sugars AC at bedtime.  Use SSI for elevated blood sugars as needed 9.  Parkinson's disease: Tends to freeze per reports. On Azilect. Has  slightly masked facies.  -- Continue Sinemet CR per home regimen--2.5 tabs BID during the day and one tap at nights. 10.  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 Louis Meckel) --last changed 7/13 by [facility]. 11.  History of CVA with left hemiplegia/dysphagia: Continue DAPT and statin. 12.  HTN: Monitor blood pressures 3 times daily with permissive hypertension systolic blood pressure around 170. 13. Parkinson's Dementia?: Continue Namenda and Pimavanderin (Nuplazid) for mood stabilization.  14. Impacted cerumen in ears- start Debrox drops x4 days both ears 15. Spasticity- has never been treated- might benefit from Botox of LUE.      Bary Leriche, PA-C 05/15/2021      I have personally performed a face to face diagnostic evaluation of this patient and formulated the key components of the plan.  Additionally, I have personally reviewed laboratory data, imaging studies, as well as relevant notes and concur with the physician assistant's documentation above.   The patient's status has not changed from the original H&P.  Any changes in documentation from the acute care  chart have been noted above.

## 2021-05-15 NOTE — Consult Note (Signed)
[facility] Inpatient Consult   05/15/2021  Robert [name] [birth year] [id number]  [facility] [[facility]]  Accountable Care Organization [ACO] Patient: Medicare CMS DCE  Patient is currently active with [facility] [[facility] Care Management for chronic disease management services.  Patient has been engaged by a [facility].  Our community based plan of care has focused on disease management and community resource support.    Review of patient's record is patient is transitioning to an inpatient rehab program at [facility].    Plan:  Follow patient periodically for progress while in [facility] inpatient rehab center for post rehab needs and restart of services, if appropriate.  Of note, [facility] Care Management services does not replace or interfere with any services that are needed or arranged by inpatient [facility] care management team.  For additional questions or referrals please contact:   Charlesetta Shanks, RN BSN CCM [facility]  [phone] business mobile phone Toll free office [phone]  Fax number: [phone] [email or web address]

## 2021-05-15 NOTE — Progress Notes (Signed)
STROKE TEAM PROGRESS NOTE    Interval History   Wife at bedside, states patient continues to improve with speech now back to baseline but still having mild right-sided weakness.  LE venous dopplers are negative for DVT .@DEcho  shows normal EF. No clots Pertinent Lab Work and Imaging    05/12/21 CT Head WO IV Contrast 1. Old right MCA territory infarct without acute intracranial abnormality. 2. ASPECTS is 10.  05/12/21 CT Angio Head and Neck W WO IV Contrast 1. Suspected occlusion of a left MCA M2 branch at a branch point in the Sylvian fissure.  2. 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 3. Patent left ICA stent. 4. Gas tracking superiorly along the course of the left sternocleidomastoid muscle, possibly recent central venous catheter placement attempt.  05/13/21 MRI Brain WO IV Contrast Tiny scattered left MCA acute infarcts.  Old large chronic right MCA infarct  05/13/21 Echocardiogram Complete  Ordered, results pending   Physical Examination   Constitutional: Calm, appropriate for condition  Cardiovascular: Normal RR Respiratory: No increased WOB   Mental status: Oriented to person, place, time not to situation speech is clear without any dysfluency, paraphasic errors. Speech: Fluent with repetition and naming intact No paraphasic errors.  Able to name and repeat well. Cranial nerves: EOMI, VFF, Tongue midline,  Motor: Normal bulk and tone. No drift.  Mild diminished fine finger movements on the right.  Orbits left over right upper extremity.  Trace weakness of right grip and right hip flexors only  Dlt Bic Tri FgS Grp HF  KnF KnE PIF DoF  R 4 4 4 4 4 3 3 3 3 3   L 2 2 2  0 0 2 2 2 2 2   Sensory: Intact to light touch throughout  Coordination: FNF intact RUE  Gait: Deferred due to BLE weakness     Assessment and Plan   [name] is a 75 y.o. 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 Post op he developed L gaze preference along with expressive and receptive aphasia for which a stroke code was called. He was not candidate for IVTPA given recent surgery and NIR declined offering intervention given the risks of surgery outweighed benefits.   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 MRI Brain results and echo results are pending. Stroke labs w/LDL 38, A1C 6.4. Stroke is likely in the setting of LICA TCAR procedure with subsequent embolization.  -Continue Aspirin and Plavix for secondary stroke prevention, vascular surgery to decide duration  - Will decide on whether to resume [facility] pending MRI findings, on [facility] due to prior DVT but it was in October 2020 and question if patient really needs to continue to be on long-term anticoagulation -Continue Simvastatin 20 mg QD for secondary stroke prevention  -Referral placed for stroke follow up at discharge   #Hypertension He has a history of HTN and takes Amlodipine 5 mg QD + Losartan 50 mg QD at home. Continue holding home blood pressure regimen at this time to allow for permissive hypertension, liberalizing pressures  up to 170. - Continue permissive HTN   #Hyperlipidemia From a stroke prevention stand point, the LDL goal is < 70. LDL is at goal, continue home Simvastatin 20 mg QD   #DMII  Hemoglobin A1C this admission noted to be 6.4, at goal from a stroke prevention standpoint. Recommend managing with SSI while inpatient, can resume outpatient diabetic regimen at discharge.   Hospital day # 3   Patient developed sudden onset of aphasia following TCAR procedure yesterday and was not given tPA due to recent surgery and  thrombectomy due to poor baseline functioning and discussion with family and risk-benefit considered not to be in favor.  He fortunately seems to have improved and his aphasia is almost back to baseline.  MRI scan shows only tiny scattered left MCA and basal ganglia infarcts.  Patient may resume anticoagulation  given h/o DVt x 2 in 2008 and 2020.Starleen Blue out of bed.  Transfer to rehab when bed available.  Long discussion with patient and wife and answered questions.  Stroke team will sign off.Greater than 50% time during this 25-minute visit was spent in counseling and coordination of care and answering questions.  Antony Contras, MD Medical Director [place] Pager: [phone] 05/15/2021 4:40 PM  To contact Stroke Continuity provider, please refer to [email or web address] After hours, contact General Neurology

## 2021-05-15 NOTE — Discharge Summary (Addendum)
Discharge Summary     Robert [name] 07-Jun-1946 75 y.o. 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 male with multiple medical problems including hypertension, hyperlipidemia, leg DVT, previous CVA and left-sided hemiparesis in 2008 that presents for evaluation of carotid artery disease.  Patient is here with his wife who provides most of the history.  She states that he was having evaluation of a sebaceous cyst in the neck and ultimately was found to have carotid disease.  Ultimately he was sent for CTA neck and CTA head at the [facility] by PCP Dr. Marjo Bicker.  The CTA neck on 04/08/2021 showed occlusion of the right cervical ICA that extended into the cavernous segment then reconstituted distally and then 67% stenosis of the proximal left ICA per report.  CT head also showed an old right MCA territory infarct.   He is in a wheelchair today but his wife states he is able to stand and walk limited distances.  He is able to transfer.  He has fairly profound left upper extremity weakness and 4 out of 5 strength in the left leg.  He remains on Eliquis for his previous DVT/stroke per the wife. No recent changes in his neurologic status.  Denies previous neck radiation.  Hospital Course:  The patient was admitted to the hospital and taken to the operating room on 05/12/2021 and underwent left TCAR.    Findings: Initial ultrasound-guided access of right common femoral vein with placement of the venous flow reversal sheath.  After cutdown on the left common carotid we were able to get the sheath in place and the lesion was then crossed after active flow reversal and angioplastied with a 6 mm x 30 mm angioplasty balloon and then stented with a 8 mm x 40 mm Enroute stent.  Stent was widely patent with no residual stenosis.   Total flow reversal time was 6 minutes.  The pt tolerated the procedure well and was transported to the PACU in good condition.  That evening, MD was called to see pt for altered mental status.  Pt usually knows his wife and situation at baseline.  He has been unable to identify his wife or current situation.  He keeps repeated "hole in the wall."  He is able to move the right hand and arm some on command.  He can move the right toes some but not really the leg.  He has a left hemiplegia baseline from prior stroke.  Code stroke was called and CTA of head and neck was done.    Case d/w Neurology.  Pt with MCA branch occlusion probably occurred around 5 pm.  Neurointerventional believes risk of intervention higher than conservative management.  Have spoken with neurology and they would like permissive hypertension we are shooting for goal less than [id number] and diastolic less than [id number].  I do not believe he is a candidate for systemic TPA with open carotid exposure.   Hopefully will improve over the next few weeks/days.   Carotid stent is patent.  By POD 1, Fortunately this morning he does appear to be raising his right leg and right arm off the bed with pretty good grip strength.  He does recognize his wife which is an improvement in his cognitive status.  We will continue aspirin Plavix statin for the left carotid stent.  I  will also restart his Eliquis.  MRI brain is ordered for today.  Appreciate neurology following.  We will also get PT OT and speech therapy to see him.  I discussed with his wife that I would anticipate he will likely need SNF but will certainly allow the work-up to continue.  He has a known right carotid occlusion previous right MCA stroke and has a fairly profound deficit on the left side at baseline prior to his hospital presentation.  He was living at home using a wheelchair before this.  We will have to see how he progresses.  OT and PT recommending CIR.   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  MRI scan shows only tiny scattered left MCA and basal ganglia infarcts.  Patient may resume anticoagulation but I question if he really needs it since his episode of DVT was in October 2020.  Start aspirin for now till decision about long-term anticoagulation can be made.  Continue ongoing stroke work-up.  Mobilize out of bed.  Therapy consults.  POD 2, he appears back at his neurologic baseline and has good strength in the right upper and lower extremity with no obvious speech issues.  Needs to continue aspirin Plavix for the carotid stent.  We will resume his Eliquis today after discussion with pharmacy.  This has been cleared by Dr. Leonie Man with neurology after MRI yesterday.  Appreciate neurology input.  Therapy is recommending CIR consult and this has been placed.  I think he has a very good prognosis looking at him today.  POD 3, Appreciate neurology seeing him.  Appears to be back to his neurologic baseline.  I do not appreciate any speech deficit and he has excellent strength in the right upper and lower extremity.  I think he is medically stable for CIR and appreciate their evaluation.  In further discussion with the wife she now only recalls one DVT in the past not two.  Lower extremity duplexes ordered by Dr. Leonie Man yesterday showed no evidence of DVT.  I agree we can probably stop the Eliquis given only 1 DVT in the past with no recurrent events per the wife and family.  Continue aspirin Plavix statin for carotid stent.  He is ready for CIR.   UPON FURTHER REVIEW OF THE CHART, PT HAS HX OF IVC FILTER AND MULTIPLE DVT'S IN THE PAST.  WILL CONTINUE ELIQUIS.   Recent Labs    05/13/21 0210  NA 138  K 4.0  CL 107  CO2 23  GLUCOSE 145*  BUN 11  CALCIUM 8.9   Recent Labs    05/13/21 0210  WBC 12.1*  HGB 14.1  HCT 42.3  PLT 311   No results for input(s): INR in the last 72 hours.   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, left 05/12/2021   Carotid artery disease ([place]) 04/29/2021   Acute pancreatitis    Palliative care  by specialist    Goals of care, counseling/discussion    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 01/07/2021   Gallstone pancreatitis 01/07/2021   Pressure injury of skin 01/05/2021   Sepsis secondary to UTI ([place]) 11/10/2020   AKI (acute kidney injury) ([place]) 11/10/2020   Hyperkalemia 11/10/2020   Parkinson's disease ([place]) 11/10/2020   Hyperglycemia 11/10/2020   Elevated LFTs 11/10/2020   Diarrhea    Other hydronephrosis    History of deep vein thrombosis (DVT) of lower extremity    Urinary retention 08/14/2019   History of CVA with residual deficit    BPH (benign prostatic hyperplasia)    Orthostatic hypotension 08/13/2019   Hyperlipidemia    Hemiparesis affecting left side as late effect of cerebrovascular accident ([place]) 12/06/2017   Gait abnormality 11/22/2017   UTI (urinary tract infection) 04/22/2017   Acute lower UTI 04/22/2017   Tremor 04/22/2017   Acute pyelonephritis 09/29/2012   Nausea 09/29/2012   Fever 09/29/2012   HTN (hypertension) 09/29/2012   H/O: CVA (cerebrovascular accident) 09/29/2012   Hearing loss 09/29/2012   Past Medical History:  Diagnosis Date   Anemia    BPH (benign prostatic hyperplasia)    CVA (cerebral vascular accident) ([place])    with left sided hemiparesis   Diabetes mellitus without complication  ([place])    Diverticulosis    Frequency of urination    GERD (gastroesophageal reflux disease)    Gross hematuria    History of acute pyelonephritis    10-13-2012   History of CVA with residual deficit    2008--  left side of body weakness and foot drop (wears leg brace and uses cane)   History of DVT of lower extremity    2008--  cva   Hypercholesteremia    Hyperlipidemia    Hyperlipidemia    Hypertension    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 2008   Left leg DVT ([facility])    Neuromuscular disorder ([facility])    Parkinsons   S/P insertion of IVC (inferior vena caval) filter    2008   Urethral stricture    Urgency of urination    Urinary retention    Weakness of left side of body    secondary to cva 2008   Wears glasses    Wears hearing aid    bilateral-- wears intermittantly    Allergies as of 05/15/2021       Reactions   Propofol Other (See Comments)   "hiccups for weeks"   Lisinopril Cough        Medication List     TAKE these medications    acetaminophen 500 MG tablet Commonly known as: TYLENOL Take 1,000 mg by mouth every 6 (six) hours as needed for moderate pain.   apixaban 5 MG Tabs tablet Commonly known as: ELIQUIS Take 1 tablet (5 mg total) by mouth 2 (two) times daily.   aspirin EC 81 MG tablet Take 81 mg by mouth daily. Swallow whole.   busPIRone 7.5 MG tablet Commonly known as: BUSPAR Take 7.5 mg by mouth 2 (two) times daily.   clopidogrel 75 MG tablet Commonly known as: PLAVIX Take 1 tablet (75 mg total) by mouth daily.   hydrOXYzine 10 MG tablet Commonly known as: ATARAX/VISTARIL Take 10 mg by mouth at bedtime as needed for sleep or itching.   loratadine 10 MG tablet Commonly known as: CLARITIN Take 10 mg by mouth daily as needed for allergies.   losartan 50 MG tablet Commonly  known as: COZAAR Take 50 mg by mouth daily.   memantine 10 MG tablet Commonly known as: NAMENDA Take 20 mg by mouth at bedtime.   metFORMIN 500 MG  tablet Commonly known as: GLUCOPHAGE Take 500 mg by mouth at bedtime.   Nuplazid 34 MG Caps Generic drug: Pimavanserin Tartrate Take 34 mg by mouth daily.   Nyamyc powder Generic drug: nystatin Apply 1 application topically daily as needed (yeast).   rasagiline 1 MG Tabs tablet Commonly known as: AZILECT Take 1 mg by mouth daily.   simvastatin 20 MG tablet Commonly known as: ZOCOR Take 20 mg by mouth every evening.   tamsulosin 0.4 MG Caps capsule Commonly known as: FLOMAX Take 0.4 mg by mouth daily.       ASK your doctor about these medications    amLODipine 5 MG tablet Commonly known as: NORVASC Take 1 tablet (5 mg total) by mouth daily.   bisacodyl 5 MG EC tablet Commonly known as: DULCOLAX Take 1 tablet (5 mg total) by mouth daily as needed for moderate constipation.   Carbidopa-Levodopa ER 25-100 MG tablet controlled release Commonly known as: SINEMET CR TAKE TWO TABLETS BY MOUTH THREE TIMES DAILY   lidocaine 5 % Commonly known as: Lidoderm Place 1 patch onto the skin daily. Remove & Discard patch within 12 hours or as directed by MD   polyethylene glycol 17 g packet Commonly known as: MiraLax Take 17 g by mouth daily.   traZODone 50 MG tablet Commonly known as: DESYREL Take 0.5 tablets (25 mg total) by mouth at bedtime as needed for sleep.         [facility] Discharge Instructions Carotid Endarterectomy (CEA)  Please refer to the following instructions for your post-procedure care. Your surgeon or physician assistant will discuss any changes with you.  Activity  You are encouraged to walk as much as you can. You can slowly return to normal activities but must avoid strenuous activity and heavy lifting until your doctor tell you it's OK. Avoid activities such as vacuuming or swinging a golf club. You can drive after one week if you are comfortable and you are no longer taking prescription pain medications. It is  normal to feel tired for serval weeks after your surgery. It is also normal to have difficulty with sleep habits, eating, and bowel movements after surgery. These will go away with time.  Bathing/Showering  You may shower after you come home. Do not soak in a bathtub, hot tub, or swim until the incision heals completely.  Incision Care  Shower every day. Clean your incision with mild soap and water. Pat the area dry with a clean towel. You do not need a bandage unless otherwise instructed. Do not apply any ointments or creams to your incision. You may have skin glue on your incision. Do not peel it off. It will come off on its own in about one week. Your incision may feel thickened and raised for several weeks after your surgery. This is normal and the skin will soften over time. For Men Only: It's OK to shave around the incision but do not shave the incision itself for 2 weeks. It is common to have numbness under your chin that could last for several months.  Diet  Resume your normal diet. There are no special food restrictions following this procedure. A low fat/low cholesterol diet is recommended for all patients with vascular disease. In order to heal from your surgery, it  is CRITICAL to get adequate nutrition. Your body requires vitamins, minerals, and protein. Vegetables are the best source of vitamins and minerals. Vegetables also provide the perfect balance of protein. Processed food has little nutritional value, so try to avoid this.  Medications  Resume taking all of your medications unless your doctor or physician assistant tells you not to.  If your incision is causing pain, you may take over-the- counter pain relievers such as acetaminophen (Tylenol). If you were prescribed a stronger pain medication, please be aware these medications can cause nausea and constipation.  Prevent nausea by taking the medication with a snack or meal. Avoid constipation by drinking plenty of fluids and  eating foods with a high amount of fiber, such as fruits, vegetables, and grains.  Do not take Tylenol if you are taking prescription pain medications.  Cozaar is on admission due to hypotension Amlodipine was not given due to pt not taking per preference  Follow Up  Our office will schedule a follow up appointment 2-3 weeks following discharge.  Please call us immediately for any of the following conditions  Increased pain, redness, drainage (pus) from your incision site. Fever of 101 degrees or higher. If you should develop stroke (slurred speech, difficulty swallowing, weakness on one side of your body, loss of vision) you should call 911 and go to the nearest emergency room.  Reduce your risk of vascular disease:  Stop smoking. If you would like help call [facility] at 1-800-QUIT-NOW 318-685-5999) or [place] at [phone]. Manage your cholesterol Maintain a desired weight Control your diabetes Keep your blood pressure down  If you have any questions, please call the office at [phone].  Prescriptions given: Non given  Disposition: CIR  Patient's condition: is Good  Follow up: 1. [facility] in 4 weeks with carotid duplex   Leontine Locket, PA-C [facility] [phone]   --- For [facility] Registry use ---   Modified Rankin score at D/C (0-6): 4 (pre-op stroke on non operative side with left hemiparesis)  IV medication needed for:  1. Hypertension: No 2. Hypotension: No  Post-op Complications: Yes  1. Post-op CVA or TIA: Yes  If yes: Event classification (right eye, left eye, right cortical, left cortical, verterobasilar, other): right sided weakness  If yes: Timing of event (intra-op, <6 hrs post-op, >=6 hrs post-op, unknown): >6 hrs  2. CN injury: No  If yes: CN n/a injuried   3. Myocardial infarction: No  If yes: Dx by (EKG or clinical, Troponin): n/a  4.  CHF: No  5.  Dysrhythmia (new): No  6. Wound infection: No  7.  Reperfusion symptoms: No  8. Return to OR: No  If yes: return to OR for (bleeding, neurologic, other CEA incision, other): n/a  Discharge medications: Statin use:  Yes ASA use:  Yes   Beta blocker use:  No ACE-Inhibitor use:  No  ARB use:  No-on hold CCB use: No P2Y12 Antagonist use: Yes, [ x] Plavix, '[ ]'$  Plasugrel, '[ ]'$  Ticlopinine, '[ ]'$  Ticagrelor, '[ ]'$  Other, '[ ]'$  No for medical reason, '[ ]'$  Non-compliant, '[ ]'$  Not-indicated Anti-coagulant use:  Yes '[x]'$  Eliquis '[ ]'$  Warfarin, '[ ]'$  Rivaroxaban, '[ ]'$  Dabigatran,

## 2021-05-15 NOTE — H&P (Signed)
Physical Medicine and Rehabilitation Admission H&P    CC: Stroke with functional deficits.   HPI: Robert [name] is a 75 year old male with history HTN, DVT,  prior CVA with residual left hemiparesis 2008, Parkinson's disease with gait disorder, chronic dysphagia (tends to hold prior to swallow since CVA 2008), urinary retention w/chronic Foley, left carotid stenosis who was admitted on 05/12/2021 for left transcarotid artery revascularization by Dr. Carlis Abbott.  On the evening evening postprocedure, he was noted to have confusion with right-sided weakness, left gaze preference and speech deficits.  tPA not offered due to recent TCAR and cerebral angiogram deferred as risks outweigh benefits.  MRI brain done showing scattered small embolic infarcts in left MCA territory and large chronic right MCA infarct.  Dr. Leonie Man felt that stroke likely a complication of TCAR procedure and to continue DAPT with question need for [facility].  He also recommended permissive hypertension with liberalizing blood pressures up to 170.    Wife had questions about need to continue Eliquis with history of only one DVT in the past therefore BLE Dopplers repeated today and were negative for DVT.  Dr. Leonie Man and Dr. Carlis Abbott has discussed this with wife and felt that Eliquis could be discontinued.  He continues on Eliquis as well as liquid diet. He is to continue on DAPT for secondary stroke prevention.  Patient's verbal output is improving but he continues to have expressive difficulties with right inattention and right-sided weakness has resolved.  Therapy evaluations revealed weakness with balance deficits and left hemiplegia affecting functional status.  CIR was recommended due to functional decline.  Pt has B/L buttock skin tears, NOT pressure ulcers- per wife since came to hospital.  LBM yesterday- with miralax.  Also has chronic foley- last changed 7/13- doesn't usually get clogged.  Has an occ cough when laying down- improved per  pt/wife if sitting up.  Ears impacted with wax- wants ear drops to help- also has hearing aid- uses on L side.    Review of Systems  Constitutional:  Negative for chills and fever.  HENT:  Positive for hearing loss.   Eyes:  Negative for blurred vision and double vision.  Respiratory:  Negative for shortness of breath.   Cardiovascular:  Positive for leg swelling. Negative for chest pain and palpitations.  Gastrointestinal:  Negative for constipation, heartburn and nausea.  Genitourinary:        Chronic foley--followed by Dr. Louis Meckel  Musculoskeletal:  Negative for falls, joint pain and myalgias.  Neurological:  Positive for focal weakness (left hemiplegia since stroke). Negative for dizziness and headaches.  Psychiatric/Behavioral:  The patient does not have insomnia.   All other systems reviewed and are negative.   Past Medical History:  Diagnosis Date   Anemia    BPH (benign prostatic hyperplasia)    CVA (cerebral vascular accident) ([place])    with left sided hemiparesis   Diabetes mellitus without complication ([place])    Diverticulosis    Frequency of urination    GERD (gastroesophageal reflux disease)    Gross hematuria    History of acute pyelonephritis    10-13-2012   History of CVA with residual deficit    2008--  left side of body weakness and foot drop (wears leg brace and uses cane)   History of DVT of lower extremity    2008--  cva   Hypercholesteremia    Hyperlipidemia    Hyperlipidemia    Hypertension    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   Left leg DVT ([place])    Neuromuscular disorder (HCC)    Parkinsons   S/P insertion of IVC (inferior vena caval) filter    2008   Urethral stricture    Urgency of urination    Urinary retention    Weakness of left side of body    secondary to cva 2008   Wears glasses    Wears hearing aid    bilateral-- wears intermittantly    Past Surgical History:  Procedure Laterality Date   CYSTOSCOPY WITH RETROGRADE  URETHROGRAM N/A 10/23/2015   Procedure: CYSTOSCOPY WITH RETROGRADE URETHROGRAM;  Surgeon: [name], MD;  Location: [facility];  Service: Urology;  Laterality: N/A;   CYSTOSCOPY WITH URETHRAL DILATATION N/A 10/23/2015   Procedure: CYSTOSCOPY WITH URETHRAL BALLOON DILATATION;  Surgeon: [name], MD;  Location: [facility];  Service: Urology;  Laterality: N/A;  BALLOON DILATION    IVC FILTER PLACEMENT ([place] [facility])  2008   LAPAROSCOPIC CHOLECYSTECTOMY SINGLE PORT N/A 01/09/2021   Procedure: LAPAROSCOPIC CHOLECYSTECTOMY WITH IOC;  Surgeon: [name], MD;  Location: [facility];  Service: General;  Laterality: N/A;  90 MIN   TRANSCAROTID ARTERY REVASCULARIZATION  Left 05/12/2021   Procedure: LEFT TRANSCAROTID ARTERY REVASCULARIZATION;  Surgeon: [name], MD;  Location: [facility] OR;  Service: Vascular;  Laterality: Left;    Family History  Problem Relation Age of Onset   Diabetes Sister    Lung cancer Brother        Post 9/11 voluntary work in [employer]   Prostate [place] [address]    Other Mother        passed away young- non medical   Alzheimer's disease Father    Healthy Son     Social History: Married. Wife uses transport chair in the home and STEDY for transfers and to get to bathroom--needs gait belt/CGA for walking a few steps.  Wife assists with all ADL tasks . He does most of his walking with [facility] twice a week. Marland Kitchen  He  reports that he quit smoking about 50 years ago. His smoking use included cigarettes. He has never used smokeless tobacco. He reports that he does not drink alcohol and does not use drugs. :   Allergies  Allergen Reactions   Propofol Other (See Comments)    "hiccups for weeks"    Lisinopril Cough    Medications Prior to Admission  Medication Sig Dispense Refill   aspirin EC 81 MG tablet Take 81 mg by mouth daily. Swallow whole.     busPIRone (BUSPAR) 7.5 MG tablet Take 7.5 mg by mouth 2 (two) times daily.      Carbidopa-Levodopa ER (SINEMET CR) 25-100 MG tablet controlled release TAKE TWO TABLETS BY MOUTH THREE TIMES DAILY  (Patient taking differently: Take 1-2.5 tablets by mouth See admin instructions. Takes 2.5 tablets in the 9am, 2.5 tablets in the 1 pm, and 1 tablet at 5pm) 180 tablet 3   clopidogrel (PLAVIX) 75 MG tablet Take 1 tablet (75 mg total) by mouth daily. 30 tablet 5   hydrOXYzine (ATARAX/VISTARIL) 10 MG tablet Take 10 mg by mouth at bedtime as needed for sleep or itching.     lidocaine (LIDODERM) 5 % Place 1 patch onto the skin daily. Remove & Discard patch within 12 hours or as directed by MD (Patient taking differently: Place 1 patch onto the skin daily as needed (pain). Remove & Discard patch within 12 hours or as directed by  MD) 14 patch 0   loratadine (CLARITIN) 10 MG tablet Take 10 mg by mouth daily as needed for allergies.     memantine (NAMENDA) 10 MG tablet Take 20 mg by mouth at bedtime.     metFORMIN (GLUCOPHAGE) 500 MG tablet Take 500 mg by mouth at bedtime.     NYAMYC powder Apply 1 application topically daily as needed (yeast).     Pimavanserin Tartrate (NUPLAZID) 34 MG CAPS Take 34 mg by mouth daily.     polyethylene glycol (MIRALAX) packet Take 17 g by mouth daily. (Patient taking differently: Take 17 g by mouth daily as needed for mild constipation.) 14 each 0   rasagiline (AZILECT) 1 MG TABS tablet Take 1 mg by mouth daily.     simvastatin (ZOCOR) 20 MG tablet Take 20 mg by mouth every evening.     tamsulosin (FLOMAX) 0.4 MG CAPS capsule Take 0.4 mg by mouth daily.     [DISCONTINUED] apixaban (ELIQUIS) 5 MG TABS tablet Take 1 tablet (5 mg total) by mouth 2 (two) times daily. 60 tablet 0   acetaminophen (TYLENOL) 500 MG tablet Take 1,000 mg by mouth every 6 (six) hours as needed for moderate pain.     amLODipine (NORVASC) 5 MG tablet Take 1 tablet (5 mg total) by mouth daily. (Patient not taking: No sig reported)     bisacodyl (DULCOLAX) 5 MG EC tablet Take 1 tablet (5 mg  total) by mouth daily as needed for moderate constipation. (Patient not taking: No sig reported) 30 tablet 0   losartan (COZAAR) 50 MG tablet Take 50 mg by mouth daily.     traZODone (DESYREL) 50 MG tablet Take 0.5 tablets (25 mg total) by mouth at bedtime as needed for sleep. (Patient not taking: No sig reported)      Drug Regimen Review  Drug regimen was reviewed and remains appropriate with no significant issues identified  Home: Home Living Family/patient expects to be discharged to:: Private residence Living Arrangements: Spouse/significant other Available Help at Discharge: Family, Available 24 hours/day Type of Home: Apartment Home Access: Level entry Home Layout: One level Bathroom Shower/Tub: Chiropodist: Handicapped height Home Equipment: Transport chair, Hospital bed, Tub bench, Bedside commode, Hand held shower head, Grab bars - tub/shower, Other (comment) Additional Comments: Tub bench on rails that slides and swivels.  Pt's wife states that transferring pt into tub is very difficult due to pt freezing with inability to sit on command. Hospital bed with air mattress.  Transport chair. Hemi-walker   Functional History: Prior Function Level of Independence: Needs assistance Gait / Transfers Assistance Needed: wife assists pt into transport chair. non-ambulatory except for a couple of steps with assistance and hemiwalker.  Going to outpatient PT 2x/week. Uses sit to stand transfer frame with wife assist ADL's / Homemaking Assistance Needed: wife assists with all ADLs and IADLs aside from self feeding and simple grooming tasks such as brushing teeth Comments: Wife assists with all ADLs.  Functional Status:  Mobility: Bed Mobility Overal bed mobility: Needs Assistance Bed Mobility: Supine to Sit, Sit to Supine Supine to sit: Total assist, HOB elevated Sit to supine: Total assist, +2 for physical assistance General bed mobility comments: Total A x 2 to  advance B LE and lift/guide trunk Transfers Overall transfer level: Needs assistance Equipment used: Hemi-walker Transfer via Lift Equipment: Stedy Transfers: Sit to/from Stand Sit to Stand: From elevated surface, Min assist, +2 physical assistance General transfer comment: min assist +2 to stand  from elevated bed. Ambulation/Gait Ambulation/Gait assistance: Min assist, +2 physical assistance, Mod assist Gait Distance (Feet): 2 Feet Assistive device: Hemi-walker Gait Pattern/deviations: Step-to pattern, Decreased step length - right General Gait Details: patient able to take a step or two with right LE with hemiwalker and min/mod assist +2. After this patient began sinking with increased B Knee flexion in standing. Required max +2 to get back onto bed safely. Gait velocity: decr    ADL: ADL Overall ADL's : Needs assistance/impaired Eating/Feeding: Sitting, Moderate assistance Eating/Feeding Details (indicate cue type and reason): Based on functional abilities noted today. NPO until speech assessment Grooming: Moderate assistance, Sitting Upper Body Bathing: Maximal assistance, Sitting Lower Body Bathing: Total assistance, +2 for physical assistance, +2 for safety/equipment, Sit to/from stand Upper Body Dressing : Maximal assistance, Sitting Lower Body Dressing: Total assistance, +2 for safety/equipment, +2 for physical assistance, Bed level, Sit to/from stand Lower Body Dressing Details (indicate cue type and reason): +2 if in standing, wife assisted to don socks, shoes (L with AFO) Toilet Transfer: Moderate assistance, +2 for physical assistance, +2 for safety/equipment Toilet Transfer Details (indicate cue type and reason): Mod A x 2 for sit to stand in [place] and transfer to [facility] over toilet Toileting- Clothing Manipulation and Hygiene: Total assistance, +2 for physical assistance, +2 for safety/equipment, Sit to/from stand Toileting - Clothing Manipulation Details (indicate cue type  and reason): Total A x 2 for sit to stand in [place]. Mod A to maintain standing while second person assists with peri care General ADL Comments: Pt with noted L gaze preference, R sided inattention, mild confusion abnormal from baseline and requires increased assist for bed mobility, sit to stand transfers  Cognition: Cognition Overall Cognitive Status: Difficult to assess (language difficulties) Arousal/Alertness: Awake/alert Orientation Level: Disoriented to place, Disoriented to situation Cognition Arousal/Alertness: Awake/alert Behavior During Therapy: Flat affect Overall Cognitive Status: Difficult to assess (language difficulties) Area of Impairment: Orientation, Attention, Memory, Following commands, Safety/judgement, Awareness, Problem solving Orientation Level: Disoriented to, Time, Situation Current Attention Level: Sustained Memory: Decreased short-term memory Following Commands: Follows one step commands inconsistently, Follows one step commands with increased time Safety/Judgement: Decreased awareness of deficits Awareness: Intellectual Problem Solving: Slow processing, Decreased initiation, Difficulty sequencing, Requires verbal cues General Comments: mild confusion noted though wife reports this is improved from yesterday's episode   Blood pressure 126/75, pulse 62, temperature 98.2 F (36.8 C), temperature source Oral, resp. rate 18, height _0  (1.88 m), weight 104.3 kg, SpO2 96 %. Physical Exam Vitals and nursing note reviewed. Exam conducted with a chaperone present.  Constitutional:      Appearance: Normal appearance.     Comments: Pt sitting up in bed- masked facies/flat; wife and granddaughter at bedside; big elderly man; NAD; hard of hearing  HENT:     Head: Normocephalic and atraumatic.     Comments: Decreased hearing left greater than right. L facial droop; tongue midline; no facial sensation changes    Right Ear: External ear normal. There is impacted  cerumen.     Left Ear: External ear normal. There is impacted cerumen.     Nose: Nose normal. No congestion.     Mouth/Throat:     Mouth: Mucous membranes are dry.     Pharynx: Oropharynx is clear. No oropharyngeal exudate.  Eyes:     General:        Right eye: No discharge.        Left eye: No discharge.     Extraocular Movements: Extraocular  movements intact.  Cardiovascular:     Rate and Rhythm: Normal rate and regular rhythm.     Heart sounds: Normal heart sounds. No murmur heard.   No gallop.  Pulmonary:     Comments: CTA B/L- no W/R/R- good air movement   Abdominal:     Comments: Soft, NT, ND, (+)BS -hypoactive  Genitourinary:    Comments: Foley in place- chronic Musculoskeletal:     Cervical back: Normal range of motion and neck supple.     Comments: Was wearing knee-high support stockings and noted to have 1+ pedal edema bilaterally.  RUE0 5-/5 LUE_ biceps 2-/5, Triceps 2-/5, grip 2-/5 -otherwise 0/5 RLE- 5-/5 LLE- HF 2-/5, KE 2/5, DF 0/5, PF 2-/5    Skin:    Comments: R AC fossa IV_ look ok;  B/L inner buttocks skin tears seen- covered with desitin per wound care nurse- don't appear to be draining.  L neck- glued- some dried blood- slightly swollen- from TCAR  Neurological:     Mental Status: He is alert and oriented to person, place, and time.     Comments: Masked facies.  Decreased hearing.  Has a left gaze preference and needs cues to attend to the right.  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  Intentional tremor RLE.  He was able to answer basic orientation question and follow simple motor commands with minimal cues.  Spasticity MAS of 3 in LUE; no significant spasticity seen in LLE- no clonus L inattention noted Ox3 - knew next holiday was Labor Day- with cues- no cues for basic orientation questions. Lacking 15-20 degrees of L elbow extension due to spasticity    Psychiatric:     Comments: Masked facies, but otherwise, appears full  affect    Results for orders placed or performed during the hospital encounter of 05/12/21 (from the past 48 hour(s))  Glucose, capillary     Status: Abnormal   Collection Time: 05/13/21 12:24 PM  Result Value Ref Range   Glucose-Capillary 146 (H) 70 - 99 mg/dL    Comment: Glucose reference range applies only to samples taken after fasting for at least 8 hours.  Glucose, capillary     Status: Abnormal   Collection Time: 05/13/21  4:20 PM  Result Value Ref Range   Glucose-Capillary 164 (H) 70 - 99 mg/dL    Comment: Glucose reference range applies only to samples taken after fasting for at least 8 hours.  Glucose, capillary     Status: Abnormal   Collection Time: 05/13/21  9:14 PM  Result Value Ref Range   Glucose-Capillary 184 (H) 70 - 99 mg/dL    Comment: Glucose reference range applies only to samples taken after fasting for at least 8 hours.  Glucose, capillary     Status: Abnormal   Collection Time: 05/14/21  6:13 AM  Result Value Ref Range   Glucose-Capillary 146 (H) 70 - 99 mg/dL    Comment: Glucose reference range applies only to samples taken after fasting for at least 8 hours.  Glucose, capillary     Status: Abnormal   Collection Time: 05/14/21 11:05 AM  Result Value Ref Range   Glucose-Capillary 144 (H) 70 - 99 mg/dL    Comment: Glucose reference range applies only to samples taken after fasting for at least 8 hours.  Glucose, capillary     Status: Abnormal   Collection Time: 05/14/21  4:11 PM  Result Value Ref Range   Glucose-Capillary 160 (H) 70 - 99  mg/dL    Comment: Glucose reference range applies only to samples taken after fasting for at least 8 hours.  Glucose, capillary     Status: Abnormal   Collection Time: 05/14/21  9:29 PM  Result Value Ref Range   Glucose-Capillary 151 (H) 70 - 99 mg/dL    Comment: Glucose reference range applies only to samples taken after fasting for at least 8 hours.  Glucose, capillary     Status: Abnormal   Collection Time: 05/15/21   6:19 AM  Result Value Ref Range   Glucose-Capillary 149 (H) 70 - 99 mg/dL    Comment: Glucose reference range applies only to samples taken after fasting for at least 8 hours.   MR BRAIN WO CONTRAST  Result Date: 05/13/2021 CLINICAL DATA:  Acute onset of aphasia with left gaze preference and right-sided weakness following left ICA stenting. EXAM: MRI HEAD WITHOUT CONTRAST TECHNIQUE: Multiplanar, multiecho pulse sequences of the brain and surrounding structures were obtained without intravenous contrast. COMPARISON:  Head CT and CTA 05/12/2021.  Head MRI 04/23/2017. FINDINGS: Brain: There are several scattered acute subcentimeter infarcts involving primarily cortex of the left cerebral hemisphere (MCA territory) in the left temporo-occipital region, parietal lobe, and posterior frontal lobe with the largest measuring 5 mm in the parietal lobe. There is also a punctate acute infarct involving the left caudate nucleus. There is a large chronic right MCA infarct with associated ex vacuo dilatation of the right lateral ventricle, a small amount of chronic blood products, and wallerian degeneration extending into the brainstem. Small T2 hyperintensities elsewhere in the cerebral white matter bilaterally are nonspecific but compatible with mild chronic small vessel ischemic disease. No mass, midline shift, or extra-axial fluid collection is identified. Vascular: Abnormal appearance of the distal right ICA corresponding to known chronic occlusion. Skull and upper cervical spine: Unremarkable bone marrow signal. Sinuses/Orbits: Unremarkable orbits. Paranasal sinuses and mastoid air cells are clear. Other: 4 cm left suboccipital cyst, larger than in 2018. IMPRESSION: 1. Scattered small acute embolic left MCA infarcts. 2. Large chronic right MCA infarct. Electronically Signed   By: Logan Bores M.D.   On: 05/13/2021 15:58   ECHOCARDIOGRAM COMPLETE  Result Date: 05/14/2021    ECHOCARDIOGRAM REPORT   Patient Name:    JEFFEY JANSSEN Date of Exam: 05/14/2021 Medical Rec #:  [id number]      Height:       74.0 in Accession #:    [id number]     Weight:       229.9 lb Date of Birth:  [birth year]      BSA:          2.306 m Patient Age:    21 years       BP:           126/78 mmHg Patient Gender: M              HR:           57 bpm. Exam Location:  Inpatient Procedure: 2D Echo, Color Doppler and Cardiac Doppler Indications:    Stroke i63.9  History:        Patient has prior history of Echocardiogram examinations, most                 recent 01/08/2021. Risk Factors:Hypertension, Diabetes,                 Dyslipidemia and Parkinson's.  Sonographer:    Raquel Sarna Senior RDCS Referring Phys: [id number] [facility]  Sonographer Comments:  Unable to reposition patient due to restricted mobility, suboptimal apical window. IMPRESSIONS  1. Left ventricular ejection fraction, by estimation, is 55 to 60%. The left ventricle has normal function. The left ventricle has no regional wall motion abnormalities. There is mild left ventricular hypertrophy. Left ventricular diastolic parameters are consistent with Grade I diastolic dysfunction (impaired relaxation).  2. Right ventricular systolic function is normal. The right ventricular size is normal. Tricuspid regurgitation signal is inadequate for assessing PA pressure.  3. The mitral valve is normal in structure. No evidence of mitral valve regurgitation. No evidence of mitral stenosis.  4. The aortic valve is tricuspid. Aortic valve regurgitation is not visualized. Mild aortic valve sclerosis is present, with no evidence of aortic valve stenosis.  5. Aortic dilatation noted. There is mild dilatation of the ascending aorta, measuring 38 mm.  6. The inferior vena cava is dilated in size with <50% respiratory variability, suggesting right atrial pressure of 15 mmHg. FINDINGS  Left Ventricle: Left ventricular ejection fraction, by estimation, is 55 to 60%. The left ventricle has normal function. The left  ventricle has no regional wall motion abnormalities. The left ventricular internal cavity size was normal in size. There is  mild left ventricular hypertrophy. Left ventricular diastolic parameters are consistent with Grade I diastolic dysfunction (impaired relaxation). Right Ventricle: The right ventricular size is normal. No increase in right ventricular wall thickness. Right ventricular systolic function is normal. Tricuspid regurgitation signal is inadequate for assessing PA pressure. Left Atrium: Left atrial size was normal in size. Right Atrium: Right atrial size was normal in size. Pericardium: There is no evidence of pericardial effusion. Mitral Valve: The mitral valve is normal in structure. No evidence of mitral valve regurgitation. No evidence of mitral valve stenosis. Tricuspid Valve: The tricuspid valve is normal in structure. Tricuspid valve regurgitation is not demonstrated. Aortic Valve: The aortic valve is tricuspid. Aortic valve regurgitation is not visualized. Mild aortic valve sclerosis is present, with no evidence of aortic valve stenosis. Pulmonic Valve: The pulmonic valve was normal in structure. Pulmonic valve regurgitation is trivial. Aorta: The aortic root is normal in size and structure and aortic dilatation noted. There is mild dilatation of the ascending aorta, measuring 38 mm. Venous: The inferior vena cava is dilated in size with less than 50% respiratory variability, suggesting right atrial pressure of 15 mmHg. IAS/Shunts: No atrial level shunt detected by color flow Doppler.  LEFT VENTRICLE PLAX 2D LVIDd:         4.00 cm  Diastology LVIDs:         2.80 cm  LV e' medial:    8.92 cm/s LV PW:         1.10 cm  LV E/e' medial:  8.5 LV IVS:        1.20 cm  LV e' lateral:   7.94 cm/s LVOT diam:     2.00 cm  LV E/e' lateral: 9.6 LV SV:         69 LV SV Index:   30 LVOT Area:     3.14 cm  RIGHT VENTRICLE RV S prime:     13.40 cm/s TAPSE (M-mode): 1.8 cm LEFT ATRIUM             Index        RIGHT ATRIUM           Index LA diam:        3.10 cm 1.34 cm/m  RA Area:     15.40 cm LA  Vol [facility]):   38.9 ml 16.87 ml/m RA Volume:   37.10 ml  16.09 ml/m LA Vol (A4C):   46.0 ml 19.95 ml/m LA Biplane Vol: 43.0 ml 18.65 ml/m  AORTIC VALVE LVOT Vmax:   105.00 cm/s LVOT Vmean:  72.800 cm/s LVOT VTI:    0.221 m  AORTA Ao Root diam: 3.60 cm Ao Asc diam:  3.80 cm MITRAL VALVE MV Area (PHT): 3.19 cm    SHUNTS MV Decel Time: 238 msec    Systemic VTI:  0.22 m MV E velocity: 76.00 cm/s  Systemic Diam: 2.00 cm MV A velocity: 73.40 cm/s MV E/A ratio:  1.04 Loralie Champagne MD Electronically signed by Loralie Champagne MD Signature Date/Time: 05/14/2021/4:33:28 PM    Final    VAS [place] LOWER EXTREMITY VENOUS (DVT)  Result Date: 05/14/2021  Lower Venous DVT Study Patient Name:  HADRIEL NORTHUP  Date of Exam:   05/14/2021 Medical Rec #: [id number]       Accession #:    [id number] Date of Birth: 08-17-1946       Patient Gender: M Patient Age:   74Y Exam Location:  [facility] Procedure:      VAS [place] LOWER EXTREMITY VENOUS (DVT) Referring Phys: [id number] PRAMOD S SETHI --------------------------------------------------------------------------------  Indications: Superficial venous thrombosis.  Limitations: Poor ultrasound/tissue interface. Comparison Study: 08/15/19 prior Performing Technologist: Archie Patten RVS  Examination Guidelines: A complete evaluation includes B-mode imaging, spectral Doppler, color Doppler, and power Doppler as needed of all accessible portions of each vessel. Bilateral testing is considered an integral part of a complete examination. Limited examinations for reoccurring indications may be performed as noted. The reflux portion of the exam is performed with the patient in reverse Trendelenburg.  +---------+---------------+---------+-----------+----------+--------------+ RIGHT    CompressibilityPhasicitySpontaneityPropertiesThrombus Aging  +---------+---------------+---------+-----------+----------+--------------+ CFV      Full           Yes      Yes                                 +---------+---------------+---------+-----------+----------+--------------+ SFJ      Full                                                        +---------+---------------+---------+-----------+----------+--------------+ FV Prox  Full                                                        +---------+---------------+---------+-----------+----------+--------------+ FV Mid   Full                                                        +---------+---------------+---------+-----------+----------+--------------+ FV DistalFull                                                        +---------+---------------+---------+-----------+----------+--------------+  PFV      Full                                                        +---------+---------------+---------+-----------+----------+--------------+ POP      Full           Yes      Yes                                 +---------+---------------+---------+-----------+----------+--------------+ PTV      Full                                                        +---------+---------------+---------+-----------+----------+--------------+ PERO     Full                                                        +---------+---------------+---------+-----------+----------+--------------+   +---------+---------------+---------+-----------+----------+--------------+ LEFT     CompressibilityPhasicitySpontaneityPropertiesThrombus Aging +---------+---------------+---------+-----------+----------+--------------+ CFV      Full           Yes      Yes                                 +---------+---------------+---------+-----------+----------+--------------+ SFJ      Full                                                         +---------+---------------+---------+-----------+----------+--------------+ FV Prox  Full                                                        +---------+---------------+---------+-----------+----------+--------------+ FV Mid                  Yes      Yes                                 +---------+---------------+---------+-----------+----------+--------------+ FV Distal               Yes                                          +---------+---------------+---------+-----------+----------+--------------+ PFV      Full                                                        +---------+---------------+---------+-----------+----------+--------------+  POP      Full           Yes      Yes                                 +---------+---------------+---------+-----------+----------+--------------+ PTV      Full                                                        +---------+---------------+---------+-----------+----------+--------------+ PERO     Full                                                        +---------+---------------+---------+-----------+----------+--------------+     Summary: BILATERAL: - No evidence of deep vein thrombosis seen in the lower extremities, bilaterally. -No evidence of popliteal cyst, bilaterally.   *See table(s) above for measurements and observations.    Preliminary        Medical Problem List and Plan: 1.  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  History of recurrent DVT/antithrombotics: -DVT/anticoagulation:  Pharmaceutical: Other (comment)--on Eliquis--will be on Eliquis lifelong after d/w Neuro and IM.   -antiplatelet therapy: DAPT 3. Pain Management: N/A 4. Mood: LCSW to follow for evaluation and support  -antipsychotic agents: On Pimavanserin daily 5. Neuropsych: This patient may not be fully  capable of making decisions on his own behalf. 6. Skin/Wound Care: Treat/monitor MASD on sacrum and continue pressure relief measures.Also has 2 small skin tears- con't desitin   -- Monitor nutritional status and offer supplements as needed Po intake.   --Will add Juven twice daily to promote wound healing. 7. Fluids/Electrolytes/Nutrition: Monitor intake/output.  Check c-Met in AM. 8.  T2DM: Resume metformin as > 48 hours since dye procedure.  -- Will monitor blood sugars AC at bedtime.  Use SSI for elevated blood sugars as needed 9.  Parkinson's disease: Tends to freeze per reports. On Azilect. Has slightly masked facies.  -- Continue Sinemet CR per home regimen--2.5 tabs BID during the day and one tap at nights.  10.  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 Louis Meckel)  --last changed 7/13 by [facility].  11.  History of CVA with left hemiplegia/dysphagia: Continue DAPT and statin. 12.  HTN: Monitor blood pressures 3 times daily with permissive hypertension systolic blood pressure around 170. 13. Parkinson's Dementia?: Continue Namenda and Pimavanderin (Nuplazid) for mood stabilization.  14. Impacted cerumen in ears- start Debrox drops x4 days both ears 15. Spasticity- has never been treated- might benefit from Botox of LUE.    Bary Leriche, PA-C 05/15/2021    I have personally performed a face to face diagnostic evaluation of this patient and formulated the key components of the plan.  Additionally, I have personally reviewed laboratory data, imaging studies, as well as relevant notes and concur with the physician assistant's documentation above.   The patient's status has not changed from the original H&P.  Any changes in documentation from the acute care chart  have been noted above.

## 2021-05-15 NOTE — Plan of Care (Signed)
  Problem: Education: Goal: Knowledge of General Education information will improve Description Including pain rating scale, medication(s)/side effects and non-pharmacologic comfort measures Outcome: Progressing   Problem: Health Behavior/Discharge Planning: Goal: Ability to manage health-related needs will improve Outcome: Progressing   Problem: Clinical Measurements: Goal: Ability to maintain clinical measurements within normal limits will improve Outcome: Progressing Goal: Will remain free from infection Outcome: Progressing Goal: Diagnostic test results will improve Outcome: Progressing Goal: Respiratory complications will improve Outcome: Progressing Goal: Cardiovascular complication will be avoided Outcome: Progressing   Problem: Activity: Goal: Risk for activity intolerance will decrease Outcome: Progressing   Problem: Nutrition: Goal: Adequate nutrition will be maintained Outcome: Progressing   Problem: Coping: Goal: Level of anxiety will decrease Outcome: Progressing   Problem: Elimination: Goal: Will not experience complications related to bowel motility Outcome: Progressing Goal: Will not experience complications related to urinary retention Outcome: Progressing   Problem: Pain Managment: Goal: General experience of comfort will improve Outcome: Progressing   Problem: Safety: Goal: Ability to remain free from injury will improve Outcome: Progressing   Problem: Skin Integrity: Goal: Risk for impaired skin integrity will decrease Outcome: Progressing   Problem: Education: Goal: Knowledge of secondary prevention will improve Outcome: Progressing Goal: Knowledge of patient specific risk factors addressed and post discharge goals established will improve Outcome: Progressing   

## 2021-05-16 ENCOUNTER — Ambulatory Visit: Payer: Self-pay | Admitting: *Deleted

## 2021-05-16 DIAGNOSIS — I63131 Cerebral infarction due to embolism of right carotid artery: Secondary | ICD-10-CM

## 2021-05-16 LAB — COMPREHENSIVE METABOLIC PANEL
ALT: 11 U/L (ref 0–44)
AST: 15 U/L (ref 15–41)
Albumin: 2.9 g/dL — ABNORMAL LOW (ref 3.5–5.0)
Alkaline Phosphatase: 57 U/L (ref 38–126)
Anion gap: 7 (ref 5–15)
BUN: 12 mg/dL (ref 8–23)
CO2: 25 mmol/L (ref 22–32)
Calcium: 9 mg/dL (ref 8.9–10.3)
Chloride: 104 mmol/L (ref 98–111)
Creatinine, Ser: 0.97 mg/dL (ref 0.61–1.24)
GFR, Estimated: >60 mL/min (ref >60)
Glucose, Bld: 146 mg/dL — ABNORMAL HIGH (ref 70–99)
Potassium: 3.9 mmol/L (ref 3.5–5.1)
Sodium: 136 mmol/L (ref 135–145)
Total Bilirubin: 0.8 mg/dL (ref 0.3–1.2)
Total Protein: 6.2 g/dL — ABNORMAL LOW (ref 6.5–8.1)

## 2021-05-16 LAB — CBC WITH DIFFERENTIAL/PLATELET
Abs Immature Granulocytes: 0.02 10*3/uL (ref 0.00–0.07)
Basophils Absolute: 0.1 10*3/uL (ref 0.0–0.1)
Basophils Relative: 1 %
Eosinophils Absolute: 0.6 10*3/uL — ABNORMAL HIGH (ref 0.0–0.5)
Eosinophils Relative: 9 %
HCT: 41.7 % (ref 39.0–52.0)
Hemoglobin: 13.3 g/dL (ref 13.0–17.0)
Immature Granulocytes: 0 %
Lymphocytes Relative: 24 %
Lymphs Abs: 1.6 10*3/uL (ref 0.7–4.0)
MCH: 28.7 pg (ref 26.0–34.0)
MCHC: 31.9 g/dL (ref 30.0–36.0)
MCV: 89.9 fL (ref 80.0–100.0)
Monocytes Absolute: 1 10*3/uL (ref 0.1–1.0)
Monocytes Relative: 15 %
Neutro Abs: 3.4 10*3/uL (ref 1.7–7.7)
Neutrophils Relative %: 51 %
Platelets: 269 10*3/uL (ref 150–400)
RBC: 4.64 MIL/uL (ref 4.22–5.81)
RDW: 14 % (ref 11.5–15.5)
WBC: 6.5 10*3/uL (ref 4.0–10.5)
nRBC: 0 % (ref 0.0–0.2)

## 2021-05-16 LAB — GLUCOSE, CAPILLARY
Glucose-Capillary: 144 mg/dL — ABNORMAL HIGH (ref 70–99)
Glucose-Capillary: 157 mg/dL — ABNORMAL HIGH (ref 70–99)
Glucose-Capillary: 111 mg/dL — ABNORMAL HIGH (ref 70–99)
Glucose-Capillary: 177 mg/dL — ABNORMAL HIGH (ref 70–99)

## 2021-05-16 NOTE — Progress Notes (Signed)
Inpatient Rehabilitation Care Coordinator Assessment and Plan Patient Details  Name: Destry Dawn MRN: [id number] Date of Birth: 18-Sep-1946  Today's Date: 05/16/2021  Hospital Problems: Principal Problem:   Embolic stroke [facility]) Active Problems:   H/O: CVA (cerebrovascular accident)   Gait abnormality   Hemiparesis affecting left side as late effect of cerebrovascular accident (HCC)   Spasticity as late effect of cerebrovascular accident (CVA)  Past Medical History:  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   Diabetes mellitus without complication (HCC)    Diverticulosis    Frequency of urination    GERD (gastroesophageal reflux disease)    Gross hematuria    History of acute pyelonephritis    10-13-2012   History of CVA with residual deficit    2008--  left side of body weakness and foot drop (wears leg brace and uses cane)   History of DVT of lower extremity    2008--  cva   Hypercholesteremia    Hyperlipidemia    Hyperlipidemia    Hypertension    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 2008   Left leg DVT (HCC)    Neuromuscular disorder (HCC)    Parkinsons   S/P insertion of IVC (inferior vena caval) filter    2008   Urethral stricture    Urgency of urination    Urinary retention    Weakness of left side of body    secondary to cva 2008   Wears glasses    Wears hearing aid    bilateral-- wears intermittantly   Past Surgical History:  Past Surgical History:  Procedure Laterality Date   CYSTOSCOPY WITH RETROGRADE URETHROGRAM N/A 10/23/2015   Procedure: CYSTOSCOPY WITH RETROGRADE URETHROGRAM;  Surgeon: Barron Alvine, MD;  Location: [facility];  Service: Urology;  Laterality: N/A;   CYSTOSCOPY WITH URETHRAL DILATATION N/A 10/23/2015   Procedure: CYSTOSCOPY WITH URETHRAL BALLOON DILATATION;  Surgeon: Barron Alvine, MD;  Location:  [facility];  Service: Urology;  Laterality: N/A;  BALLOON DILATION    IVC FILTER PLACEMENT ([facility] HX)  2008   LAPAROSCOPIC CHOLECYSTECTOMY SINGLE PORT N/A 01/09/2021   Procedure: LAPAROSCOPIC CHOLECYSTECTOMY WITH IOC;  Surgeon: Karie Soda, MD;  Location: [facility];  Service: General;  Laterality: N/A;  90 MIN   TRANSCAROTID ARTERY REVASCULARIZATION  Left 05/12/2021   Procedure: LEFT TRANSCAROTID ARTERY REVASCULARIZATION;  Surgeon: Cephus Shelling, MD;  Location: [facility] OR;  Service: Vascular;  Laterality: Left;   Social History:  reports that he quit smoking about 50 years ago. His smoking use included cigarettes. He has never used smokeless tobacco. He reports that he does not drink alcohol and does not use drugs.  Family / Support Systems Marital Status: Married How Long?: 40 years Patient Roles: Spouse, Parent Spouse/Significant Other: Tyler Aas (wife): [phone]) Children: 1 adult son lives in [place]. Other Supports: None reported Anticipated Caregiver: wife Ability/Limitations of Caregiver: wife is the only caregiver Caregiver Availability: 24/7 Family Dynamics: Pt lives with his wife.  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 Some trouble with hearing due wax build-up per wife. Recent Psychosocial Issues: Denies Psychiatric History: Pt wife reports pt takes  medication for anxiety. Substance Abuse History: Denies  Patient / Family Perceptions, Expectations & Goals Pt/Family understanding of illness & functional limitations: Pt and wife have a general understanding of pt care needs Premorbid pt/family roles/activities: Assistance with ADLs/IADLs Anticipated changes in roles/activities/participation: no changes  Recruitment consultant Agencies: None Premorbid Home Care/DME Agencies: Other (Comment) ([facility]; 13hrs per [facility]) Transportation available at discharge: Wife  Discharge Planning Living Arrangements: Spouse/significant other Support Systems: Spouse/significant other Type of Residence: Private residence Insurance Resources: Media planner (specify), Medicare (AARP and Building surveyor) Surveyor, quantity Resources: SSI, Family Support Financial Screen Referred: No Living Expenses: Rent Money Management: Spouse Does the patient have any problems obtaining your medications?: No Home Management: wife manages all home care needs Patient/Family Preliminary Plans: No changes Care Coordinator Barriers to Discharge: Decreased caregiver support, Lack of/limited family support Care Coordinator Anticipated Follow Up Needs: HH/OP  Clinical Impression This SW covering for primary SW, [employer].  SW met with pt and pt wife Tyler Aas to introduce self, explain role, and discuss discharge process. Pt wife provided some answers for pt since he is having a hard time hearing. PT wife is HCPOA. 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 Pt wife provides all transportation to medical appointments. DME: transport chair, TTB, RW, hospital bed, hand held shower head, grab bars, and BSC.   Toshi Ishii A Rhylan Gross 05/16/2021, 4:12 PM

## 2021-05-16 NOTE — Progress Notes (Signed)
[facility]  Subjective  -seen in rehab working with OT.   Objective (!) 149/75 77 99.4 F (37.4 C) (Oral) 18 97%  Intake/Output Summary (Last 24 hours) at 05/16/2021 1128 Last data filed at 05/16/2021 0803 Gross per 24 hour  Intake 500 ml  Output 1600 ml  Net -1100 ml    Left neck incision clean dry and intact Appears back to his neurologic baseline  Laboratory Lab Results: Recent Labs    05/16/21 0432  WBC 6.5  HGB 13.3  HCT 41.7  PLT 269   BMET Recent Labs    05/16/21 0432  NA 136  K 3.9  CL 104  CO2 25  GLUCOSE 146*  BUN 12  CREATININE 0.97  CALCIUM 9.0    COAG Lab Results  Component Value Date   INR 1.2 05/08/2021   INR 1.6 (H) 01/05/2021   INR 1.2 11/09/2020   No results found for: PTT  Assessment/Planning:  75 year old male status post left TCAR for asymptomatic high-grade stenosis.  He did have a small stroke postoperatively on the floor.  He has been transition to rehab.  Appears to be back to his neurologic baseline and is making good progress in therapy.  Please call vascular with questions or concerns.  We will continue dual antiplatelet therapy for the stent and also restart his DOAC for DVT.  Discussed after 1 month can likely stop the Plavix and do aspirin plus Eliquis.   [name] 05/16/2021 11:28 AM --

## 2021-05-16 NOTE — Progress Notes (Signed)
PROGRESS NOTE   Subjective/Complaints:    Objective:   ECHOCARDIOGRAM COMPLETE  Result Date: 05/14/2021    ECHOCARDIOGRAM REPORT   Patient Name:   Robert [name] Date of Exam: 05/14/2021 Medical Rec #:  [id number]      Height:       74.0 in Accession #:    [id number]     Weight:       229.9 lb Date of Birth:  08-Sep-1946      BSA:          2.306 m Patient Age:    75 years       BP:           126/78 mmHg Patient Gender: M              HR:           57 bpm. Exam Location:  Inpatient Procedure: 2D Echo, Color Doppler and Cardiac Doppler Indications:    Stroke i63.9  History:        Patient has prior history of Echocardiogram examinations, most                 recent 01/08/2021. Risk Factors:Hypertension, Diabetes,                 Dyslipidemia and Parkinson's.  Sonographer:    Raquel Sarna Senior RDCS Referring Phys: [id number] [facility]  Sonographer Comments: Unable to reposition patient due to restricted mobility, suboptimal apical window. IMPRESSIONS  1. Left ventricular ejection fraction, by estimation, is 55 to 60%. The left ventricle has normal function. The left ventricle has no regional wall motion abnormalities. There is mild left ventricular hypertrophy. Left ventricular diastolic parameters are consistent with Grade I diastolic dysfunction (impaired relaxation).  2. Right ventricular systolic function is normal. The right ventricular size is normal. Tricuspid regurgitation signal is inadequate for assessing PA pressure.  3. The mitral valve is normal in structure. No evidence of mitral valve regurgitation. No evidence of mitral stenosis.  4. The aortic valve is tricuspid. Aortic valve regurgitation is not visualized. Mild aortic valve sclerosis is present, with no evidence of aortic valve stenosis.  5. Aortic dilatation noted. There is mild dilatation of the ascending aorta, measuring 38 mm.  6. The inferior vena cava is dilated in size with <50%  respiratory variability, suggesting right atrial pressure of 15 mmHg. FINDINGS  Left Ventricle: Left ventricular ejection fraction, by estimation, is 55 to 60%. The left ventricle has normal function. The left ventricle has no regional wall motion abnormalities. The left ventricular internal cavity size was normal in size. There is  mild left ventricular hypertrophy. Left ventricular diastolic parameters are consistent with Grade I diastolic dysfunction (impaired relaxation). Right Ventricle: The right ventricular size is normal. No increase in right ventricular wall thickness. Right ventricular systolic function is normal. Tricuspid regurgitation signal is inadequate for assessing PA pressure. Left Atrium: Left atrial size was normal in size. Right Atrium: Right atrial size was normal in size. Pericardium: There is no evidence of pericardial effusion. Mitral Valve: The mitral valve is normal in structure. No evidence of mitral valve regurgitation. No evidence of mitral valve stenosis. Tricuspid Valve: The tricuspid  valve is normal in structure. Tricuspid valve regurgitation is not demonstrated. Aortic Valve: The aortic valve is tricuspid. Aortic valve regurgitation is not visualized. Mild aortic valve sclerosis is present, with no evidence of aortic valve stenosis. Pulmonic Valve: The pulmonic valve was normal in structure. Pulmonic valve regurgitation is trivial. Aorta: The aortic root is normal in size and structure and aortic dilatation noted. There is mild dilatation of the ascending aorta, measuring 38 mm. Venous: The inferior vena cava is dilated in size with less than 50% respiratory variability, suggesting right atrial pressure of 15 mmHg. IAS/Shunts: No atrial level shunt detected by color flow Doppler.  LEFT VENTRICLE PLAX 2D LVIDd:         4.00 cm  Diastology LVIDs:         2.80 cm  LV e' medial:    8.92 cm/s LV PW:         1.10 cm  LV E/e' medial:  8.5 LV IVS:        1.20 cm  LV e' lateral:   7.94 cm/s  LVOT diam:     2.00 cm  LV E/e' lateral: 9.6 LV SV:         69 LV SV Index:   30 LVOT Area:     3.14 cm  RIGHT VENTRICLE RV S prime:     13.40 cm/s TAPSE (M-mode): 1.8 cm LEFT ATRIUM             Index       RIGHT ATRIUM           Index LA diam:        3.10 cm 1.34 cm/m  RA Area:     15.40 cm LA Vol (A2C):   38.9 ml 16.87 ml/m RA Volume:   37.10 ml  16.09 ml/m LA Vol (A4C):   46.0 ml 19.95 ml/m LA Biplane Vol: 43.0 ml 18.65 ml/m  AORTIC VALVE LVOT Vmax:   105.00 cm/s LVOT Vmean:  72.800 cm/s LVOT VTI:    0.221 m  AORTA Ao Root diam: 3.60 cm Ao Asc diam:  3.80 cm MITRAL VALVE MV Area (PHT): 3.19 cm    SHUNTS MV Decel Time: 238 msec    Systemic VTI:  0.22 m MV E velocity: 76.00 cm/s  Systemic Diam: 2.00 cm MV A velocity: 73.40 cm/s MV E/A ratio:  1.04 Loralie Champagne MD Electronically signed by Loralie Champagne MD Signature Date/Time: 05/14/2021/4:33:28 PM    Final    VAS [place] LOWER EXTREMITY VENOUS (DVT)  Result Date: 05/15/2021  Lower Venous DVT Study Patient Name:  Robert [name]  Date of Exam:   05/14/2021 Medical Rec #: [id number]       Accession #:    [id number] Date of Birth: 09-04-1946       Patient Gender: M Patient Age:   74Y Exam Location:  [facility] Procedure:      VAS [place] LOWER EXTREMITY VENOUS (DVT) Referring Phys: [id number] PRAMOD S SETHI --------------------------------------------------------------------------------  Indications: Superficial venous thrombosis.  Limitations: Poor ultrasound/tissue interface. Comparison Study: 08/15/19 prior Performing Technologist: Archie Patten RVS  Examination Guidelines: A complete evaluation includes B-mode imaging, spectral Doppler, color Doppler, and power Doppler as needed of all accessible portions of each vessel. Bilateral testing is considered an integral part of a complete examination. Limited examinations for reoccurring indications may be performed as noted. The reflux portion of the exam is performed with the patient in reverse Trendelenburg.   +---------+---------------+---------+-----------+----------+--------------+ RIGHT    CompressibilityPhasicitySpontaneityPropertiesThrombus Aging +---------+---------------+---------+-----------+----------+--------------+  CFV      Full           Yes      Yes                                 +---------+---------------+---------+-----------+----------+--------------+ SFJ      Full                                                        +---------+---------------+---------+-----------+----------+--------------+ FV Prox  Full                                                        +---------+---------------+---------+-----------+----------+--------------+ FV Mid   Full                                                        +---------+---------------+---------+-----------+----------+--------------+ FV DistalFull                                                        +---------+---------------+---------+-----------+----------+--------------+ PFV      Full                                                        +---------+---------------+---------+-----------+----------+--------------+ POP      Full           Yes      Yes                                 +---------+---------------+---------+-----------+----------+--------------+ PTV      Full                                                        +---------+---------------+---------+-----------+----------+--------------+ PERO     Full                                                        +---------+---------------+---------+-----------+----------+--------------+   +---------+---------------+---------+-----------+----------+--------------+ LEFT     CompressibilityPhasicitySpontaneityPropertiesThrombus Aging +---------+---------------+---------+-----------+----------+--------------+ CFV      Full           Yes      Yes                                  +---------+---------------+---------+-----------+----------+--------------+  SFJ      Full                                                        +---------+---------------+---------+-----------+----------+--------------+ FV Prox  Full                                                        +---------+---------------+---------+-----------+----------+--------------+ FV Mid                  Yes      Yes                                 +---------+---------------+---------+-----------+----------+--------------+ FV Distal               Yes                                          +---------+---------------+---------+-----------+----------+--------------+ PFV      Full                                                        +---------+---------------+---------+-----------+----------+--------------+ POP      Full           Yes      Yes                                 +---------+---------------+---------+-----------+----------+--------------+ PTV      Full                                                        +---------+---------------+---------+-----------+----------+--------------+ PERO     Full                                                        +---------+---------------+---------+-----------+----------+--------------+     Summary: BILATERAL: - No evidence of deep vein thrombosis seen in the lower extremities, bilaterally. -No evidence of popliteal cyst, bilaterally.   *See table(s) above for measurements and observations. Electronically signed by Monica Martinez MD on 05/15/2021 at 2:26:34 PM.    Final    Recent Labs    05/16/21 0432  WBC 6.5  HGB 13.3  HCT 41.7  PLT 269   Recent Labs    05/16/21 0432  NA 136  K 3.9  CL 104  CO2 25  GLUCOSE 146*  BUN 12  CREATININE 0.97  CALCIUM 9.0    Intake/Output Summary (Last 24 hours) at 05/16/2021  7681 Last data filed at 05/16/2021 0620 Gross per 24 hour  Intake 440 ml  Output 1600 ml  Net -1160 ml      Pressure Injury 01/05/21 Right Stage 2 -  Partial thickness loss of dermis presenting as a shallow open injury with a red, pink wound bed without slough. (Active)  01/05/21 0500  Location:   Location Orientation: Right  Staging: Stage 2 -  Partial thickness loss of dermis presenting as a shallow open injury with a red, pink wound bed without slough.  Wound Description (Comments):   Present on Admission: Yes    Physical Exam: Vital Signs Blood pressure (!) 149/75, pulse 77, temperature 99.4 F (37.4 C), temperature source Oral, resp. rate 18, height $RemoveBe'6\' 2"'SNqcNlBUF$  (1.88 m), weight 102.9 kg, SpO2 97 %.    Assessment/Plan: 1. Functional deficits which require 3+ hours per day of interdisciplinary therapy in a comprehensive inpatient rehab setting. Physiatrist is providing close team supervision and 24 hour management of active medical problems listed below. Physiatrist and rehab team continue to assess barriers to discharge/monitor patient progress toward functional and medical goals  Care Tool:  Bathing              Bathing assist       Upper Body Dressing/Undressing Upper body dressing   What is the patient wearing?: Hospital gown only    Upper body assist Assist Level: Moderate Assistance - Patient 50 - 74%    Lower Body Dressing/Undressing Lower body dressing      What is the patient wearing?: [place] only     Lower body assist       Toileting Toileting    Toileting assist Assist for toileting: Total Assistance - Patient < 25%     Transfers Chair/bed transfer  Transfers assist           Locomotion Ambulation   Ambulation assist              Walk 10 feet activity   Assist           Walk 50 feet activity   Assist           Walk 150 feet activity   Assist           Walk 10 feet on uneven surface  activity   Assist           Wheelchair     Assist               Wheelchair 50 feet with 2 turns  activity    Assist            Wheelchair 150 feet activity     Assist          Blood pressure (!) 149/75, pulse 77, temperature 99.4 F (37.4 C), temperature source Oral, resp. rate 18, height $RemoveBe'6\' 2"'NmhhbevTt$  (1.88 m), weight 102.9 kg, SpO2 97 %.  Medical Problem List and Plan: 1.  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  History of recurrent DVT/antithrombotics: -DVT/anticoagulation:  Pharmaceutical: Other (comment)--on Eliquis--will be on Eliquis lifelong after d/w Neuro and IM.              -antiplatelet therapy: DAPT 3. Pain Management: N/A 4. Mood:  LCSW to follow for evaluation and support             -antipsychotic agents: On Pimavanserin daily 5. Neuropsych: This patient may not be fully capable of making decisions on his own behalf. 6. Skin/Wound Care: Treat/monitor MASD on sacrum and continue pressure relief measures.Also has 2 small skin tears- con't desitin              -- Monitor nutritional status and offer supplements as needed Po intake.   --Will add Juven twice daily to promote wound healing. 7. Fluids/Electrolytes/Nutrition: Monitor intake/output.  Check c-Met in AM. 8.  T2DM: Resume metformin as > 48 hours since dye procedure. -- Will monitor blood sugars AC at bedtime.  Use SSI for elevated blood sugars as needed CBG (last 3)  Recent Labs    05/15/21 1710 05/15/21 2113 05/16/21 0610  GLUCAP 128* 153* 144*  Controlled 7/22  9.  Parkinson's disease: Tends to freeze per reports. On Azilect. Has slightly masked facies.  -- Continue Sinemet CR per home regimen--2.5 tabs BID during the day and one tap at nights. 10.  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 Louis Meckel) --last changed 7/13 by [facility]. 11.  History of CVA with left hemiplegia/dysphagia: Continue DAPT and statin. 12.  HTN: Monitor  blood pressures 3 times daily with permissive hypertension systolic blood pressure around 170. Vitals:   05/15/21 1929 05/16/21 0613  BP: (!) 143/73 (!) 149/75  Pulse: 65 77  Resp: 20 18  Temp: 98 F (36.7 C) 99.4 F (37.4 C)  SpO2: 98% 97%  Good range 7/22  13. Parkinson's Dementia?: Continue Namenda and Pimavanderin (Nuplazid) for mood stabilization.  14. Impacted cerumen in ears- start Debrox drops x4 days both ears 15. Spasticity- has never been treated- might benefit from Botox of LUE.    LOS: 1 days A FACE TO FACE EVALUATION WAS PERFORMED  Charlett Blake 05/16/2021, 7:17 AM

## 2021-05-16 NOTE — Evaluation (Signed)
Physical Therapy Assessment and Plan  Patient Details  Name: Robert [name] MRN: [id number] Date of Birth: 05/07/1946  PT Diagnosis: Abnormal posture, Abnormality of gait, Hemiplegia non-dominant, and Muscle weakness Rehab Potential: Good ELOS: 7 days   Today's Date: 05/16/2021 PT Individual Time: 1300-1405 PT Individual Time Calculation (min): 65 min    Hospital Problem: Principal Problem:   Embolic stroke ([place]) Active Problems:   H/O: CVA (cerebrovascular accident)   Gait abnormality   Hemiparesis affecting left side as late effect of cerebrovascular accident ([place])   Spasticity as late effect of cerebrovascular accident (CVA)   Past Medical History:  Past Medical History:  Diagnosis Date   Anemia    BPH (benign prostatic hyperplasia)    CVA (cerebral vascular accident) ([place])    with left sided hemiparesis   Diabetes mellitus without complication ([place])    Diverticulosis    Frequency of urination    GERD (gastroesophageal reflux disease)    Gross hematuria    History of acute pyelonephritis    10-13-2012   History of CVA with residual deficit    2008--  left side of body weakness and foot drop (wears leg brace and uses cane)   History of DVT of lower extremity    2008--  cva   Hypercholesteremia    Hyperlipidemia    Hyperlipidemia    Hypertension    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 2008   Left leg DVT ([place])    Neuromuscular disorder ([place])    Parkinsons   S/P insertion of IVC (inferior vena caval) filter    2008   Urethral stricture    Urgency of urination    Urinary retention    Weakness of left side of body    secondary to cva 2008   Wears glasses    Wears hearing aid    bilateral-- wears intermittantly   Past Surgical History:  Past Surgical History:  Procedure Laterality Date   CYSTOSCOPY WITH RETROGRADE URETHROGRAM N/A 10/23/2015   Procedure: CYSTOSCOPY WITH RETROGRADE URETHROGRAM;  Surgeon: [name], MD;  Location: [facility];   Service: Urology;  Laterality: N/A;   CYSTOSCOPY WITH URETHRAL DILATATION N/A 10/23/2015   Procedure: CYSTOSCOPY WITH URETHRAL BALLOON DILATATION;  Surgeon: [name], MD;  Location: [facility];  Service: Urology;  Laterality: N/A;  BALLOON DILATION    IVC FILTER PLACEMENT ([place] HX)  2008   LAPAROSCOPIC CHOLECYSTECTOMY SINGLE PORT N/A 01/09/2021   Procedure: LAPAROSCOPIC CHOLECYSTECTOMY WITH IOC;  Surgeon: [name], MD;  Location: [facility];  Service: General;  Laterality: N/A;  90 MIN   TRANSCAROTID ARTERY REVASCULARIZATION  Left 05/12/2021   Procedure: LEFT TRANSCAROTID ARTERY REVASCULARIZATION;  Surgeon: [name], MD;  [facility];  Service: Vascular;  Laterality: Left;    Assessment & Plan Clinical Impression: Patient is a 75 y.o. year old male with recent admission to the hospital on 05/02/21 with history HTN, DVT,  prior CVA with residual left hemiparesis 2008, Parkinson's disease with gait disorder, chronic dysphagia (tends to hold prior to swallow since CVA 2008), urinary retention w/chronic Foley, left carotid stenosis who was admitted on 05/12/2021 for left transcarotid artery revascularization by Dr. Carlis Abbott.  On the evening evening postprocedure, he was noted to have confusion with right-sided weakness, left gaze preference and speech deficits.  tPA not offered due to recent TCAR and cerebral angiogram deferred as risks outweigh benefits.  MRI brain done showing scattered small embolic infarcts in left MCA  territory and large chronic right MCA infarct.  Patient transferred to CIR on 05/15/2021 .    Patient currently requires max with mobility secondary to muscle weakness and muscle joint tightness, abnormal tone, decreased coordination, and decreased motor planning, and decreased sitting balance, decreased standing balance, decreased postural control, hemiplegia, and decreased balance strategies.  Prior to hospitalization, patient was min with mobility and  lived with Spouse in a [place] home.  Home access is  Level entry.  Patient will benefit from skilled PT intervention to maximize safe functional mobility, minimize fall risk, and decrease caregiver burden for planned discharge home with 24 hour assist.  Anticipate patient will benefit from follow up OP at discharge.  PT - End of Session Activity Tolerance: Tolerates 30+ min activity with multiple rests Endurance Deficit: Yes Endurance Deficit Description: fatigued with short distance hallway ambulation PT Assessment Rehab Potential (ACUTE/IP ONLY): Good PT Patient demonstrates impairments in the following area(s): Balance;Endurance;Motor;Safety PT Transfers Functional Problem(s): Bed Mobility;Bed to Chair;Car PT Locomotion Functional Problem(s): Ambulation PT Plan PT Intensity: Minimum of 1-2 x/day ,45 to 90 minutes PT Frequency: 5 out of 7 days PT Duration Estimated Length of Stay: 7 days PT Treatment/Interventions: Ambulation/gait training;DME/adaptive equipment instruction;Neuromuscular re-education;UE/LE Strength taining/ROM;UE/LE Coordination activities;Therapeutic Activities;Discharge planning;Balance/vestibular training;Cognitive remediation/compensation;Disease management/prevention;Functional mobility training;Patient/family education;Splinting/orthotics;Therapeutic Exercise PT Transfers Anticipated Outcome(s): S bed<>chair with bed rail; min A car transfersl PT Locomotion Anticipated Outcome(s): min A short distance ambulation with HW and AFO PT Recommendation Follow Up Recommendations: 24 hour supervision/assistance;Outpatient PT Patient destination: Home Equipment Recommended: None recommended by PT   PT Evaluation Precautions/Restrictions Precautions Precautions: Fall Precaution Comments: L neck incision, hx of L hemiparesis Required Braces or Orthoses: Other Brace Other Brace: AFO LLE Restrictions Weight Bearing Restrictions: No General   Vital SignsTherapy  Vitals Temp: 98.1 F (36.7 C) Temp Source: Oral Pulse Rate: 98 Resp: 16 BP: 113/75 Patient Position (if appropriate): Lying Oxygen Therapy SpO2: 94 % O2 Device: Room Air Pain Pain Assessment Pain Scale: 0-10 Pain Score: 0-No pain Home Living/Prior Functioning Home Living Living Arrangements: Spouse/significant other Available Help at Discharge: Family Type of Home: Apartment  Lives With: Spouse Vision/Perception  Vision - Assessment Alignment/Gaze Preference: Gaze left Perception Perception: Impaired Inattention/Neglect: Does not attend to left side of body Praxis Praxis: Not tested  Cognition Overall Cognitive Status: Impaired/Different from baseline Arousal/Alertness: Awake/alert Orientation Level: Oriented X4 Attention: Sustained Sustained Attention: Impaired Sustained Attention Impairment: Verbal basic;Functional basic Memory: Impaired Memory Impairment: Decreased recall of new information;Decreased short term memory Decreased Short Term Memory: Verbal basic;Functional basic Awareness: Impaired Awareness Impairment: Emergent impairment Problem Solving: Impaired Problem Solving Impairment: Functional basic;Verbal basic Safety/Judgment: Impaired Sensation Sensation Light Touch: Appears Intact Hot/Cold: Not tested Proprioception: Not tested Stereognosis: Not tested Coordination Gross Motor Movements are Fluid and Coordinated: No Fine Motor Movements are Fluid and Coordinated: No Coordination and Movement Description: Rigid movement, left hemiplegia, freezing at times Motor  Motor Motor: Hemiplegia;Abnormal tone Motor - Skilled Clinical Observations: Dense left hemiplegia, increased tension left elbow/hand.  Proximal weakness in RUE   Trunk/Postural Assessment  Cervical Assessment Cervical Assessment: Exceptions to [facility] Thoracic Assessment Thoracic Assessment: Exceptions to [facility] Thoracic AROM Overall Thoracic AROM: Due to premorid status Lumbar  Assessment Lumbar Assessment: Exceptions to [facility] Lumbar AROM Overall Lumbar AROM: Deficits;Due to premorid status Postural Control Postural Control: Deficits on evaluation Trunk Control: Inactivity Left trunk, stiffness throughout trunk Postural Limitations: Limited ability to flex forward for transitional movements - sit to stand, scooting, squatting  Balance Balance Balance Assessed: Yes  Dynamic Sitting Balance Dynamic Sitting - Balance Support: Feet supported;Right upper extremity supported Dynamic Sitting - Level of Assistance: 5: Stand by assistance Sitting balance - Comments: turning to look over shoulders bilateral, reaching about 18" out of BOS Static Standing Balance Static Standing - Balance Support: Right upper extremity supported Static Standing - Level of Assistance: 4: Min assist Dynamic Standing Balance Dynamic Standing - Level of Assistance: 3: Mod assist;2: Max assist Dynamic Standing - Balance Activities: Reaching for objects Dynamic Standing - Comments: standing reaching for armrest to sit Extremity Assessment      RLE Assessment Active Range of Motion (AROM) Comments: WFL tight hamstrings General Strength Comments: hip flexion 4-/5, knee flexion 4-/5, knee extension 4+/5, ankle DF 4+/5 LLE Assessment LLE Assessment: Exceptions to [facility] Active Range of Motion (AROM) Comments: limited hip flexion, ankle DF/PF and hip abduction/extension due to weakness General Strength Comments: hip flexion 3-/5, knee flexion 2-/5, knee extension 4-/5, ankle DF did not activate antigravity  Care Tool Care Tool Bed Mobility Roll left and right activity   Roll left and right assist level: Maximal Assistance - Patient 25 - 49%    Sit to lying activity   Sit to lying assist level: Maximal Assistance - Patient 25 - 49%    Lying to sitting edge of bed activity   Lying to sitting edge of bed assist level: Total Assistance - Patient < 25%     Care Tool Transfers Sit to stand  transfer   Sit to stand assist level: Maximal Assistance - Patient 25 - 49%    Chair/bed transfer   Chair/bed transfer assist level: Maximal Assistance - Patient 25 - 49%     Toilet transfer Toilet transfer activity did not occur: N/A (foley)      Car transfer   Car transfer assist level: 2 helpers      Care Tool Locomotion Ambulation   Assist level: Moderate Assistance - Patient 50 - 74% Assistive device: Orthosis (and wall rail) Max distance: 7'  Walk 10 feet activity Walk 10 feet activity did not occur: Safety/medical concerns       Walk 50 feet with 2 turns activity Walk 50 feet with 2 turns activity did not occur: Safety/medical concerns      Walk 150 feet activity Walk 150 feet activity did not occur: Safety/medical concerns      Walk 10 feet on uneven surfaces activity Walk 10 feet on uneven surfaces activity did not occur: Safety/medical concerns      Stairs Stair activity did not occur: Safety/medical concerns        Walk up/down 1 step activity       Walk up/down 4 steps activity did not occuR: Safety/medical concerns  Walk up/down 4 steps activity      Walk up/down 12 steps activity Walk up/down 12 steps activity did not occur: Safety/medical concerns      Pick up small objects from floor Pick up small object from the floor (from standing position) activity did not occur: Safety/medical concerns      Wheelchair Will patient use wheelchair at discharge?: No   Wheelchair activity did not occur: N/A      Wheel 50 feet with 2 turns activity Wheelchair 50 feet with 2 turns activity did not occur: N/A    Wheel 150 feet activity Wheelchair 150 feet activity did not occur: N/A      Refer to Care Plan for Long Term Goals  SHORT TERM GOAL WEEK 1 PT Short Term Goal  1 (Week 1): STG=LTG due to ELOS  Recommendations for other services: None   Skilled Therapeutic Intervention Patient in w/c in room finishing lunch with wife assistance.  Patient in his  transport chair from home with two cushions top being roho as wife reports needs height for transfers.  Patient in chair for strength, ROM and tone assessments.  Wife giving information throughout about how pt functions in his home with equipment.  Patient transferred w/c to bed using bedrail as he does at home with mod A.  Patient needing to pull up with decreased anterior weight shift.  Patient performed seated balance tasks on EOB reaching about 18" out of BOS but unable to reach cup on floor to pick it up reporting feels as if could fall forward.  Patient sit to supine with mod to max A for LE's and trunk repositioning.  Patient sleeps on his back per wife and difficult to roll (NT reports needed lot of help to roll).  Performed supine to sit using elevation of HOB as wife reports this is how he does it at home.  Patient needing help with L LE and to lift trunk (max/total A).  Patient transferred to stand to [facility] with mod A from elevated height of bed.  Patient unable to step forward with HW freezing and unable to progress L LE, but then used bedrail to take steps toward chair, but unable to turn to sit so wife brought chair behind him.  Assisted in chair to hallway and pt sit to stand using wall rail with min to mod A and ambulated with wall rail on R, L AFO and min to mod A with w/c following about 7-8' x 2.  Patient assisted in w/c to ortho gym and wife as well to show how they perform car transfers.  Elevated height of car simulator to midsize SUV and pt sit to stand holding door with wife providing door stability and pivot with mod A to push hips so he can sit on the seat.  Patient unable to lift legs high enough to get them into car simulator due to length of legs.  Patient assisted back to w/c using door again to pull up and step to sit on w/c.  Assisted in w/c to room and left with wife and son in the room and NT aware to assist back to bed.  Mobility Bed Mobility Bed Mobility: Supine to Sit;Sit to  Supine Supine to Sit: Total Assistance - Patient < 25% (uses elevated HOB) Sit to Supine: Maximal Assistance - Patient 25-49% (both legs assisted into bed, and repositioned trunk) Transfers Transfers: Sit to Stand;Stand to Sit;Stand Pivot Transfers Sit to Stand: Maximal Assistance - Patient 25-49% (elevated bed, lifting and lowering help) Stand Pivot Transfers: Maximal Assistance - Patient 25 - 49% Stand Pivot Transfer Details (indicate cue type and reason): increased time, assist for sequencing, using rail for stability, lowering help for safety Transfer (Assistive device): Other (Comment) (bed rail) Locomotion  Gait Ambulation: Yes Gait Assistance: Moderate Assistance - Patient 50-74% Gait Distance (Feet): 6 Feet Assistive device: Other (Comment) (wall rail) Gait Assistance Details: Verbal cues for safe use of DME/AE;Verbal cues for precautions/safety Stairs / Additional Locomotion Stairs: No Wheelchair Mobility Wheelchair Mobility: No   Discharge Criteria: Patient will be discharged from PT if patient refuses treatment 3 consecutive times without medical reason, if treatment goals not met, if there is a change in medical status, if patient makes no progress towards goals or if patient is  discharged from hospital.  The above assessment, treatment plan, treatment alternatives and goals were discussed and mutually agreed upon: by patient and by family  Jamison Oka, PT 05/16/2021, 5:10 PM

## 2021-05-16 NOTE — Plan of Care (Signed)
  Problem: RH Balance Goal: LTG Patient will maintain dynamic sitting balance (PT) Description: LTG:  Patient will maintain dynamic sitting balance with assistance during mobility activities (PT) Flowsheets (Taken 05/16/2021 1725) LTG: Pt will maintain dynamic sitting balance during mobility activities with:: Supervision/Verbal cueing Goal: LTG Patient will maintain dynamic standing balance (PT) Description: LTG:  Patient will maintain dynamic standing balance with assistance during mobility activities (PT) Flowsheets (Taken 05/16/2021 1725) LTG: Pt will maintain dynamic standing balance during mobility activities with:: Contact Guard/Touching assist   Problem: Sit to Stand Goal: LTG:  Patient will perform sit to stand with assistance level (PT) Description: LTG:  Patient will perform sit to stand with assistance level (PT) Flowsheets (Taken 05/16/2021 1725) LTG: PT will perform sit to stand in preparation for functional mobility with assistance level: Contact Guard/Touching assist   Problem: RH Bed Mobility Goal: LTG Patient will perform bed mobility with assist (PT) Description: LTG: Patient will perform bed mobility with assistance, with/without cues (PT). Flowsheets (Taken 05/16/2021 1725) LTG: Pt will perform bed mobility with assistance level of: Minimal Assistance - Patient > 75%   Problem: RH Bed to Chair Transfers Goal: LTG Patient will perform bed/chair transfers w/assist (PT) Description: LTG: Patient will perform bed to chair transfers with assistance (PT). Flowsheets (Taken 05/16/2021 1725) LTG: Pt will perform Bed to Chair Transfers with assistance level: Supervision/Verbal cueing   Problem: RH Car Transfers Goal: LTG Patient will perform car transfers with assist (PT) Description: LTG: Patient will perform car transfers with assistance (PT). Flowsheets (Taken 05/16/2021 1725) LTG: Pt will perform car transfers with assist:: Minimal Assistance - Patient > 75%   Problem: RH  Ambulation Goal: LTG Patient will ambulate in controlled environment (PT) Description: LTG: Patient will ambulate in a controlled environment, # of feet with assistance (PT). Flowsheets (Taken 05/16/2021 1725) LTG: Pt will ambulate in controlled environ  assist needed:: Minimal Assistance - Patient > 75% LTG: Ambulation distance in controlled environment: 45' with HW Goal: LTG Patient will ambulate in home environment (PT) Description: LTG: Patient will ambulate in home environment, # of feet with assistance (PT). Flowsheets (Taken 05/16/2021 1725) LTG: Ambulation distance in home environment: 25' w/ [place], PT

## 2021-05-16 NOTE — Progress Notes (Signed)
Inpatient Rehabilitation  Patient information reviewed and entered into eRehab system by Laloni Rowton M. Jakki Doughty, M.A., CCC/SLP, PPS Coordinator.  Information including medical coding, functional ability and quality indicators will be reviewed and updated through discharge.    

## 2021-05-16 NOTE — Evaluation (Signed)
Speech Language Pathology Assessment and Plan  Patient Details  Name: Robert [name] MRN: [id number] Date of Birth: 07-31-1946  SLP Diagnosis: Cognitive Impairments;Speech and Language deficits;Dysphagia  Rehab Potential: Good ELOS: 7-10 days    Today's Date: 05/16/2021 SLP Individual Time: 0800-0900 SLP Individual Time Calculation (min): 60 min   Hospital Problem: Principal Problem:   Embolic stroke ([place]) Active Problems:   H/O: CVA (cerebrovascular accident)   Gait abnormality   Hemiparesis affecting left side as late effect of cerebrovascular accident ([place])   Spasticity as late effect of cerebrovascular accident (CVA)  Past Medical History:  Past Medical History:  Diagnosis Date   Anemia    BPH (benign prostatic hyperplasia)    CVA (cerebral vascular accident) ([place])    with left sided hemiparesis   Diabetes mellitus without complication ([place])    Diverticulosis    Frequency of urination    GERD (gastroesophageal reflux disease)    Gross hematuria    History of acute pyelonephritis    10-13-2012   History of CVA with residual deficit    2008--  left side of body weakness and foot drop (wears leg brace and uses cane)   History of DVT of lower extremity    2008--  cva   Hypercholesteremia    Hyperlipidemia    Hyperlipidemia    Hypertension    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 2008   Left leg DVT ([place])    Neuromuscular disorder (HCC)    Parkinsons   S/P insertion of IVC (inferior vena caval) filter    2008   Urethral stricture    Urgency of urination    Urinary retention    Weakness of left side of body    secondary to cva 2008   Wears glasses    Wears hearing aid    bilateral-- wears intermittantly   Past Surgical History:  Past Surgical History:  Procedure Laterality Date   CYSTOSCOPY WITH RETROGRADE URETHROGRAM N/A 10/23/2015   Procedure: CYSTOSCOPY WITH RETROGRADE URETHROGRAM;  Surgeon: [name], MD;  Location[facility];   Service: Urology;  Laterality: N/A;   CYSTOSCOPY WITH URETHRAL DILATATION N/A 10/23/2015   Procedure: CYSTOSCOPY WITH URETHRAL BALLOON DILATATION;  Surgeon: [name], MD;  Location: [facility];  Service: Urology;  Laterality: N/A;  BALLOON DILATION    IVC FILTER PLACEMENT ([place] [facility])  2008   LAPAROSCOPIC CHOLECYSTECTOMY SINGLE PORT N/A 01/09/2021   Procedure: LAPAROSCOPIC CHOLECYSTECTOMY WITH IOC;  Surgeon: [name], MD;  Location: [facility];  Service: General;  Laterality: N/A;  90 MIN   TRANSCAROTID ARTERY REVASCULARIZATION  Left 05/12/2021   Procedure: LEFT TRANSCAROTID ARTERY REVASCULARIZATION;  Surgeon: [name], MD;  Location: [place];  Service: Vascular;  Laterality: Left;    Assessment / Plan / Recommendation Clinical Impression Robert [name] is a 75 year old male with history HTN, DVT,  prior CVA with residual left hemiparesis 2008, Parkinson's disease with gait disorder, chronic dysphagia (tends to hold prior to swallow since CVA 2008), urinary retention w/chronic Foley, left carotid stenosis who was admitted on 05/12/2021 for left transcarotid artery revascularization by Dr. Carlis Abbott. On the evening postprocedure, he was noted to have confusion with right-sided weakness, left gaze preference and speech deficits. tPA not offered due to recent TCAR and cerebral angiogram deferred as risks outweigh benefits. MRI brain done showing scattered small embolic infarcts in left MCA territory and large chronic right MCA infarct.  Dr. Leonie Man felt that stroke likely  a complication of TCAR procedure and to continue DAPT with question need for [facility].  He also recommended permissive hypertension with liberalizing blood pressures up to 170.     Wife had questions about need to continue Eliquis with history of only one DVT in the past therefore BLE Dopplers repeated today and were negative for DVT.  Dr. Leonie Man and Dr. Carlis Abbott has discussed this with wife and felt that Eliquis could be  discontinued.  He continues on Eliquis as well as liquid diet. He is to continue on DAPT for secondary stroke prevention.  Patient's verbal output is improving but he continues to have expressive difficulties with right inattention and right-sided weakness has resolved.  Therapy evaluations revealed weakness with balance deficits and left hemiplegia affecting functional status.  CIR was recommended due to functional decline.   Patient exhibits cognitive-linguistic deficits as evidenced by decreased orientation, working memory, short-term recall, sustained attention, processing, and problem solving. Spouse endorsed mild baseline cognitive deficits secondary to prior CVA in 2008 and reports patient is near baseline to her interpretation.   Administered the Western Aphasia Battery Bedside (WAB-Bedside) with results suggestive of anomia. Deficits can be characterized by intermittent word finding deficits at conversational level, and decreased thought organization. Patient forgot what he was saying mid-sentence at times, which may also attribute to decreased working memory and attention. Comprehension was appropriate for basic yes/no questions, following simple commands, and appropriate responses to simple conversation. Required additional processing time for most structured linguistic tasks and following sequential commands. Suspect improvement from evaluation completed in acute care on 7/19. Motor speech production was clear without indication or concerns for dysarthria. Per patient report, patient received assistance from spouse for medication/money management and other iADLs. See results below:   Spontaneous Speech Content: 7/10 Spontaneous Speech Fluency: 9/10 Auditory Verbal Comprehension Yes/No Questions: 10/10 Following sequential commands: 8/10 Verbal repetition: 7/10  Object naming: 10/10 Reading: not tested Writing: first name only  BSE completed revealing mild oropharyngeal dysphagia  characterized by mildly prolonged mastication, oral residue with Dys 2 textures. Mild anterior labial spillage noted with Dys 1. Delayed swallow initiation most notable with liquid sips as compared to solid bolus. Anterior labial spillage on left and delayed swallow initiation are baseline s/p prior CVA per spouse report. He exhibited no overt s/sx of aspiration among all tested consistencies. Recommend diet advancement to Dys 2 and thin liquids. Meds crushed in puree. Patient recommended to have set-up assist and intermittent supervision for implementation of safe swallow strategies and precautions including small bites/sips, assessing for pocketing on left, and for optimal positioning. Patient and spouse in agreement.  Patient would benefit from skilled SLP intervention to maximize cognitive-linguistic and language function and functional independence prior to discharge. Anticipate patient will require 24hr supervision and may benefit from [facility] or OP speech therapy services.     Skilled Therapeutic Interventions          Administered Western Aphasia Battery and additional informal cognitive-linguistic and language assessment. Completed BSE with recommendations for Dys 2 diet and thin liquids. Educated patient and spouse on results of assessments and recommendations. Both in agreement with plan.    SLP Assessment  Patient will need skilled Speech Lanaguage Pathology Services during CIR admission    Recommendations  SLP Diet Recommendations: Dysphagia 2 (Fine chop);Thin Liquid Administration via: Cup;Straw Medication Administration: Crushed with puree Supervision: Patient able to self feed;Intermittent supervision to cue for compensatory strategies (set-up assist) Compensations: Slow rate;Small sips/bites;Lingual sweep for clearance of pocketing;Monitor for anterior loss  Postural Changes and/or Swallow Maneuvers: Seated upright 90 degrees Oral Care Recommendations: Oral care BID Patient destination:  Home Follow up Recommendations: 24 hour supervision/assistance;Home Health SLP;Outpatient SLP Equipment Recommended: None recommended by SLP    SLP Frequency 3 to 5 out of 7 days   SLP Duration  SLP Intensity  SLP Treatment/Interventions 7-10 days  Minumum of 1-2 x/day, 30 to 90 minutes  Cognitive remediation/compensation;Environmental controls;Internal/external aids;Speech/Language facilitation;Cueing hierarchy;Dysphagia/aspiration precaution training;Patient/family education    Pain Pain Assessment Pain Scale: 0-10 Pain Score: 0-No pain  Prior Functioning Cognitive/Linguistic Baseline: Baseline deficits Baseline deficit details: decreased short-term recall from previous CVA Type of Home: Apartment  Lives With: Spouse Available Help at Discharge: Family  SLP Evaluation Cognition Overall Cognitive Status: Impaired/Different from baseline Arousal/Alertness: Awake/alert Orientation Level: Oriented X4 Attention: Sustained Sustained Attention: Impaired Sustained Attention Impairment: Verbal basic;Functional basic Memory: Impaired Memory Impairment: Decreased recall of new information;Decreased short term memory Decreased Short Term Memory: Verbal basic;Functional basic Awareness: Impaired Awareness Impairment: Emergent impairment Problem Solving: Impaired Problem Solving Impairment: Functional basic;Verbal basic Safety/Judgment: Impaired  Comprehension Auditory Comprehension Overall Auditory Comprehension: Impaired (functional yet benefited from additional processing time and repetition likely secondary to hearing loss) Yes/No Questions: Within Functional Limits Commands: Impaired One Step Basic Commands: 75-100% accurate Two Step Basic Commands: 50-74% accurate Multistep Basic Commands: 0-24% accurate Complex Commands: 0-24% accurate Conversation: Simple Interfering Components: Attention;Processing speed;Working Field seismologist: Extra processing  time;Increased volume;Repetition Visual Recognition/Discrimination Discrimination: Not tested Reading Comprehension Reading Status: Unable to assess (comment) (patient requires reading glasses. Spouse to bring in upcoming sessions) Expression Expression Primary Mode of Expression: Verbal Verbal Expression Overall Verbal Expression: Impaired Initiation: No impairment Level of Generative/Spontaneous Verbalization: Sentence Repetition: Impaired Level of Impairment: Sentence level Naming: No impairment Pragmatics: Impairment Impairments: Eye contact Non-Verbal Means of Communication: Not applicable Written Expression Dominant Hand: Right Written Expression: Exceptions to [facility] (wrote first name only with no errors.) Oral Motor Oral Motor/Sensory Function Overall Oral Motor/Sensory Function: Mild impairment Facial ROM: Reduced left (baseline deficits from prior CVA per spouse) Facial Symmetry: Abnormal symmetry left (baseline deficits from prior CVA per spouse) Facial Strength: Reduced left (baseline deficits from prior CVA per spouse) Lingual ROM: Within Functional Limits Lingual Symmetry: Within Functional Limits Lingual Strength: Reduced Mandible: Within Functional Limits Motor Speech Overall Motor Speech: Appears within functional limits for tasks assessed Respiration: Within functional limits Phonation: Normal Resonance: Within functional limits Articulation: Within functional limitis Intelligibility: Intelligible Motor Planning: Witnin functional limits Motor Speech Errors: Not applicable  Care Tool Care Tool Cognition Expression of Ideas and Wants Expression of Ideas and Wants: Some difficulty - exhibits some difficulty with expressing needs and ideas (e.g, some words or finishing thoughts) or speech is not clear   Understanding Verbal and Non-Verbal Content Understanding Verbal and Non-Verbal Content: Usually understands - understands most conversations, but misses some  part/intent of message. Requires cues at times to understand (Can be limited due to [facility])   Memory/Recall Ability *first 3 days only Memory/Recall Ability *first 3 days only: That he or she is in a hospital/hospital unit;Current season     PMSV Assessment  PMSV Trial Intelligibility: Intelligible  Bedside Swallowing Assessment General Date of Onset: 05/12/21 Previous Swallow Assessment: unknown Diet Prior to this Study: Dysphagia 1 (puree);Thin liquids Behavior/Cognition: Alert;Cooperative;Pleasant mood Oral Cavity - Dentition: Adequate natural dentition Self-Feeding Abilities: Needs set up Patient Positioning: Upright in bed Baseline Vocal Quality: Normal Volitional Cough: Strong Volitional Swallow: Able to elicit  Oral Care Assessment Does patient have any of  the following "high(er) risk" factors?: None of the above Does patient have any of the following "at risk" factors?: Other - dysphagia Patient is AT RISK: Order set for Adult Oral Care Protocol initiated -  "At Risk Patients" option selected (see row information) Thin Liquid Thin Liquid: Within functional limits (bolus holding prior to swallow - spouse endorsed baseline from prior CVA. No overt s/sx of aspiration.) Presentation: Straw Puree Puree: Impaired Presentation: Self Fed Oral Phase Functional Implications: Left anterior spillage Other Comments: improved swallow initiation as compared to thin liquid sips Solid Solid: Impaired Presentation: Self Fed Oral Phase Impairments: Impaired mastication Oral Phase Functional Implications: Oral residue BSE Assessment Risk for Aspiration Impact on safety and function: Mild aspiration risk Other Related Risk Factors: History of dysphagia;Previous CVA;Cognitive impairment  Short Term Goals: Week 1: SLP Short Term Goal 1 (Week 1): Patient will consume current diet without overt s/s of aspiration with min A verbal cues for use of swallowing compensatory strategies SLP Short  Term Goal 2 (Week 1): Patient will utilize external memory aids to recall new, daily information with min A verbal/visual cues SLP Short Term Goal 3 (Week 1): Patient will demonstrate selective attention to functional tasks for 10 miunute intervals with min A verbal cues for redirection SLP Short Term Goal 4 (Week 1): Patient will demonstrate functional problem solving witn milldy complex tasks with Min A verbal cues SLP Short Term Goal 5 (Week 1): Patient will utilize communication/word finding strategies at conversational level to repair communication breakdowns with min A verbal cues.  Refer to Care Plan for Long Term Goals  Recommendations for other services: None   Discharge Criteria: Patient will be discharged from SLP if patient refuses treatment 3 consecutive times without medical reason, if treatment goals not met, if there is a change in medical status, if patient makes no progress towards goals or if patient is discharged from hospital.  The above assessment, treatment plan, treatment alternatives and goals were discussed and mutually agreed upon: by patient  Patty Sermons 05/16/2021, 5:48 PM

## 2021-05-16 NOTE — Discharge Instructions (Addendum)
Inpatient Rehab Discharge Instructions  Robert [name] Discharge date and time:  05/28/21  Activities/Precautions/ Functional Status: Activity: activity as tolerated Diet: diabetic diet Wound Care: none needed  Functional status:  ___ No restrictions     ___ Walk up steps independently _X__ 24/7 supervision/assistance   ___ Walk up steps with assistance ___ Intermittent supervision/assistance  ___ Bathe/dress independently ___ Walk with walker     _X__ Bathe/dress with assistance ___ Walk Independently    ___ Shower independently ___ Walk with assistance    ___ Shower with assistance _X__ No alcohol     ___ Return to work/school ________  COMMUNITY REFERRALS UPON DISCHARGE:    Home Health:   PT     OT     ST    RN ([place])                     Agency: [facility] Phone: [phone]    Medical Equipment/Items Ordered: Half Lap Tray                                                 [facility]lier[facility]   Special Instructions: Foley care twice a day.    My questions have been answered and I understand these instructions. I will adhere to these goals and the provided educational materials after my discharge from the hospital.  Patient/Caregiver Signature _______________________________ Date __________  Clinician Signature _______________________________________ Date __________  Please bring this form and your medication list with you to all your follow-up doctor's appointments.

## 2021-05-16 NOTE — IPOC Note (Addendum)
Overall Plan of Care [facility]) Patient Details Name: Robert [name] MRN: [id number] DOB: 11-Oct-1946  Admitting Diagnosis: Embolic stroke [facility])  Hospital Problems: Principal Problem:   Embolic stroke [facility]) Active Problems:   H/O: CVA (cerebrovascular accident)   Gait abnormality   Hemiparesis affecting left side as late effect of cerebrovascular accident ([place])   Spasticity as late effect of cerebrovascular accident (CVA)     Functional Problem List: Nursing Bladder, Bowel, Medication Management, Safety, Pain, Endurance  PT Balance, Endurance, Motor, Safety  OT Balance, Cognition, Vision, Endurance, Motor  SLP Cognition, Linguistic  TR         Basic ADL's: OT Dressing, Bathing, Toileting     Advanced  ADL's: OT       Transfers: PT Bed Mobility, Bed to Chair, Teacher, early years/pre, Tub/Shower     Locomotion: PT Ambulation     Additional Impairments: OT Fuctional Use of Upper Extremity  SLP Swallowing, Communication comprehension, expression    TR      Anticipated Outcomes Item Anticipated Outcome  Self Feeding min assist  Swallowing  Sup A   Basic self-care  mod assist  Toileting  max assist   Bathroom Transfers total assist  Bowel/Bladder  manage bladder w total assist and bowel w mod I  Transfers  S bed<>chair with bed rail; min A car transfersl  Locomotion  min A short distance ambulation with HW and AFO  Communication  Sup A  Cognition  Sup for use of compensatory memory strategies, min A for basic problem solving and attention  Pain  at or below level 4  Safety/Judgment  maintain safety w cues/reminders   Therapy Plan: PT Intensity: Minimum of 1-2 x/day ,45 to 90 minutes PT Frequency: 5 out of 7 days PT Duration Estimated Length of Stay: 7 days OT Intensity: Minimum of 1-2 x/day, 45 to 90 minutes OT Frequency: 5 out of 7 days OT Duration/Estimated Length of Stay: 7-10 days SLP Intensity: Minumum of 1-2 x/day, 30 to 90 minutes SLP Frequency: 3 to  5 out of 7 days SLP Duration/Estimated Length of Stay: 7-10 days   Due to the current state of emergency, patients may not be receiving their 3-hours of Medicare-mandated therapy.   Team Interventions: Nursing Interventions Disease Management/Prevention, Bladder Management, Medication Management, Discharge Planning, Pain Management, Bowel Management, Patient/Family Education, Dysphagia/Aspiration Precaution Training  PT interventions Ambulation/gait training, DME/adaptive equipment instruction, Neuromuscular re-education, UE/LE Strength taining/ROM, UE/LE Coordination activities, Therapeutic Activities, Discharge planning, Balance/vestibular training, Cognitive remediation/compensation, Disease management/prevention, Functional mobility training, Patient/family education, Splinting/orthotics, Therapeutic Exercise  OT Interventions Balance/vestibular training, Neuromuscular re-education, Self Care/advanced ADL retraining, Therapeutic Exercise, Cognitive remediation/compensation, DME/adaptive equipment instruction, UE/LE Strength taining/ROM, Patient/family education, UE/LE Coordination activities, Discharge planning, Functional mobility training, Therapeutic Activities, Visual/perceptual remediation/compensation  SLP Interventions Cognitive remediation/compensation, Environmental controls, Internal/external aids, Speech/Language facilitation, Cueing hierarchy, Dysphagia/aspiration precaution training, Patient/family education  TR Interventions    SW/CM Interventions Discharge Planning, Psychosocial Support, Patient/Family Education   Barriers to Discharge MD  Medical stability  Nursing Decreased caregiver support, Home environment access/layout 1 level aot w spouse has DME from previous stroke  PT      OT Other (comments) None anticipated - supportive wife, has equipment for bathroom, has transport chair with cushion  SLP      SW Decreased caregiver support, Lack of/limited family support      Team Discharge Planning: Destination: PT-Home ,OT- Home , SLP-Home Projected Follow-up: PT-24 hour supervision/assistance, Outpatient PT, OT-  Outpatient OT, SLP-24 hour supervision/assistance, Home Health  SLP, Outpatient SLP Projected Equipment Needs: PT-None recommended by PT, OT-  , SLP-None recommended by SLP Equipment Details: PT- , OT-has sit to stand lift, swivel shower bench, toilet riser, transport chair Patient/family involved in discharge planning: PT- Patient, Family member/caregiver,  OT-Patient, Family member/caregiver, SLP-Patient, Family member/caregiver  MD ELOS: 10-14d Medical Rehab Prognosis:  Good Assessment: 75 year old male with history HTN, DVT,  prior CVA with residual left hemiparesis 2008, Parkinson's disease with gait disorder, chronic dysphagia (tends to hold prior to swallow since CVA 2008), urinary retention w/chronic Foley, left carotid stenosis who was admitted on 05/12/2021 for left transcarotid artery revascularization by Dr. Carlis Abbott.  On the evening evening postprocedure, he was noted to have confusion with right-sided weakness, left gaze preference and speech deficits.  tPA not offered due to recent TCAR and cerebral angiogram deferred as risks outweigh benefits.  MRI brain done showing scattered small embolic infarcts in left MCA territory and large chronic right MCA infarct.  Dr. Leonie Man felt that stroke likely a complication of TCAR procedure and to continue DAPT with question need for [facility].  He also recommended permissive hypertension with liberalizing blood pressures up to 170.     Wife had questions about need to continue Eliquis with history of only one DVT in the past therefore BLE Dopplers repeated today and were negative for DVT.  Dr. Leonie Man and Dr. Carlis Abbott has discussed this with wife and felt that Eliquis could be discontinued.  He continues on Eliquis as well as liquid diet. He is to continue on DAPT for secondary stroke prevention.  Patient's verbal output is  improving but he continues to have expressive difficulties with right inattention and right-sided weakness has resolved.  Therapy evaluations revealed weakness with balance deficits and left hemiplegia affecting functional status.  CIR was recommended due to functional decline.    See Team Conference Notes for weekly updates to the plan of care

## 2021-05-16 NOTE — Evaluation (Signed)
Occupational Therapy Assessment and Plan  Patient Details  Name: Robert [name] MRN: [id number] Date of Birth: 03-20-1946  OT Diagnosis: abnormal posture, cognitive deficits, disturbance of vision, hemiplegia affecting non-dominant side, and muscle weakness (generalized) Rehab Potential:   ELOS: 7-10 days   Today's Date: 05/16/2021 OT Individual Time: 6811-5726 OT Individual Time Calculation (min): 65 min     Hospital Problem: Principal Problem:   Embolic stroke ([place]) Active Problems:   H/O: CVA (cerebrovascular accident)   Gait abnormality   Hemiparesis affecting left side as late effect of cerebrovascular accident ([place])   Spasticity as late effect of cerebrovascular accident (CVA)   Past Medical History:  Past Medical History:  Diagnosis Date   Anemia    BPH (benign prostatic hyperplasia)    CVA (cerebral vascular accident) ([place])    with left sided hemiparesis   Diabetes mellitus without complication ([place])    Diverticulosis    Frequency of urination    GERD (gastroesophageal reflux disease)    Gross hematuria    History of acute pyelonephritis    10-13-2012   History of CVA with residual deficit    2008--  left side of body weakness and foot drop (wears leg brace and uses cane)   History of DVT of lower extremity    2008--  cva   Hypercholesteremia    Hyperlipidemia    Hyperlipidemia    Hypertension    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 2008   Left leg DVT ([place])    Neuromuscular disorder ([facility])    Parkinsons   S/P insertion of IVC (inferior vena caval) filter    2008   Urethral stricture    Urgency of urination    Urinary retention    Weakness of left side of body    secondary to cva 2008   Wears glasses    Wears hearing aid    bilateral-- wears intermittantly   Past Surgical History:  Past Surgical History:  Procedure Laterality Date   CYSTOSCOPY WITH RETROGRADE URETHROGRAM N/A 10/23/2015   Procedure: CYSTOSCOPY WITH RETROGRADE URETHROGRAM;  Surgeon:  [name], MD;  Location: [facility];  Service: Urology;  Laterality: N/A;   CYSTOSCOPY WITH URETHRAL DILATATION N/A 10/23/2015   Procedure: CYSTOSCOPY WITH URETHRAL BALLOON DILATATION;  Surgeon: [name], MD;  Location: [facility];  Service: Urology;  Laterality: N/A;  BALLOON DILATION    IVC FILTER PLACEMENT ([place] [facility])  2008   LAPAROSCOPIC CHOLECYSTECTOMY SINGLE PORT N/A 01/09/2021   Procedure: LAPAROSCOPIC CHOLECYSTECTOMY WITH IOC;  Surgeon: [name], MD;  Location: [facility];  Service: General;  Laterality: N/A;  90 MIN   TRANSCAROTID ARTERY REVASCULARIZATION  Left 05/12/2021   Procedure: LEFT TRANSCAROTID ARTERY REVASCULARIZATION;  Surgeon: [name], MD;  Location: [facility] OR;  Service: Vascular;  Laterality: Left;    Assessment & Plan Clinical Impression: Patient is a 75 y.o. year old male with recent admission to the hospital on 05/02/21 with history HTN, DVT,  prior CVA with residual left hemiparesis 2008, Parkinson's disease with gait disorder, chronic dysphagia (tends to hold prior to swallow since CVA 2008), urinary retention w/chronic Foley, left carotid stenosis who was admitted on 05/12/2021 for left transcarotid artery revascularization by Dr. Carlis Abbott.  On the evening evening postprocedure, he was noted to have confusion with right-sided weakness, left gaze preference and speech deficits.  tPA not offered due to recent TCAR and cerebral angiogram deferred as risks outweigh benefits.  MRI brain done showing  scattered small embolic infarcts in left MCA territory and large chronic right MCA infarct.  Patient transferred to CIR on 05/15/2021 .    Patient currently requires total with basic self-care skills secondary to impaired timing and sequencing, abnormal tone, and decreased motor planning, decreased visual perceptual skills, decreased initiation, decreased attention, decreased awareness, and decreased problem solving, and decreased sitting  balance, decreased standing balance, decreased postural control, hemiplegia, and decreased balance strategies.  Prior to hospitalization, patient could complete basic self care skills  with mod.  Patient will benefit from skilled intervention to decrease level of assist with basic self-care skills prior to discharge home with care partner.  Anticipate patient will require moderate physical assestance and follow up outpatient.  OT - End of Session Activity Tolerance: Decreased this session Endurance Deficit: Yes Endurance Deficit Description: Patient reports nausea, and fatigue initially.  Wife reports weakness and decreased activity tolerance is biggest change with recent stroke OT Assessment OT Barriers to Discharge: Other (comments) OT Barriers to Discharge Comments: None anticipated - supportive wife, has equipment for bathroom, has transport chair with cushion OT Patient demonstrates impairments in the following area(s): Balance;Cognition;Vision;Endurance;Motor OT Basic ADL's Functional Problem(s): Dressing;Bathing;Toileting OT Transfers Functional Problem(s): Toilet;Tub/Shower OT Additional Impairment(s): Fuctional Use of Upper Extremity OT Plan OT Intensity: Minimum of 1-2 x/day, 45 to 90 minutes OT Frequency: 5 out of 7 days OT Duration/Estimated Length of Stay: 7-10 days OT Treatment/Interventions: Balance/vestibular training;Neuromuscular re-education;Self Care/advanced ADL retraining;Therapeutic Exercise;Cognitive remediation/compensation;DME/adaptive equipment instruction;UE/LE Strength taining/ROM;Patient/family education;UE/LE Coordination activities;Discharge planning;Functional mobility training;Therapeutic Activities;Visual/perceptual remediation/compensation OT Self Feeding Anticipated Outcome(s): min assist OT Basic Self-Care Anticipated Outcome(s): mod assist OT Toileting Anticipated Outcome(s): max assist OT Bathroom Transfers Anticipated Outcome(s): total assist OT  Recommendation Patient destination: Home Follow Up Recommendations: Outpatient OT Equipment Details: has sit to stand lift, swivel shower bench, toilet riser, transport chair   OT Evaluation Precautions/Restrictions  Precautions Precautions: Fall Precaution Comments: L neck incision, hx of L hemiparesis Required Braces or Orthoses: Other Brace Other Brace: AFO LLE Restrictions Weight Bearing Restrictions: No General Chart Reviewed: Yes Additional Pertinent History: Patient with significant stroke in '08 with dense left hemiplegia, and dx Parkinson's DIsease - 3 yrs ago per wife Family/Caregiver Present: Yes (wife [name]) Vital Signs Therapy Vitals Temp: 98.1 F (36.7 C) Temp Source: Oral Pulse Rate: 98 Resp: 16 BP: 113/75 Patient Position (if appropriate): Lying Oxygen Therapy SpO2: 94 % O2 Device: Room Air Pain Pain Assessment Pain Scale: 0-10 Pain Score: 0-No pain Faces Pain Scale: No hurt Home Living/Prior Functioning Home Living Family/patient expects to be discharged to:: Private residence Living Arrangements: Spouse/significant other Available Help at Discharge: Family Type of Home: Apartment Home Access: Level entry Home Layout: One level Bathroom Shower/Tub: Chiropodist: Handicapped height Additional Comments: Tub bench on rails that slides and swivels.  Pt's wife states that transferring pt into tub is very difficult due to pt freezing with inability to sit on command. Hospital bed with air mattress.  Transport chair. Hemi-walker  Lives With: Spouse Vision Baseline Vision/History:  (reports no change) Patient Visual Report: No change from baseline (per report - although with left bias during eval) Vision Assessment?: Vision impaired- to be further tested in functional context Alignment/Gaze Preference: Gaze left Additional Comments: left gaze preference.  Patient and wife report no visual changes Perception  Perception:  Impaired Inattention/Neglect: Does not attend to left side of body Comments: decreased scanning to right functionally Praxis Praxis: Not tested Cognition Overall Cognitive Status: Impaired/Different from baseline (Initially after  surgery, confused, disoriented - still delayed processing but clearing.  HOH) Arousal/Alertness: Lethargic Orientation Level: Place;Situation Year: 2016 Month: June Day of Week: Incorrect Memory: Impaired Memory Impairment: Retrieval deficit Immediate Memory Recall: Sock Memory Recall Sock: With Cue Memory Recall Blue: With Cue Memory Recall Bed: With Cue Attention: Sustained Sustained Attention: Appears intact Awareness: Impaired Awareness Impairment: Emergent impairment Problem Solving: Impaired Problem Solving Impairment: Functional basic Executive Function: Initiating;Organizing;Decision Making Organizing: Impaired Organizing Impairment: Functional basic Decision Making: Impaired Decision Making Impairment: Functional basic Initiating: Impaired Initiating Impairment: Functional basic Safety/Judgment: Impaired Sensation Sensation Light Touch: Appears Intact Hot/Cold: Not tested Proprioception: Not tested Stereognosis: Not tested Coordination Gross Motor Movements are Fluid and Coordinated: No Fine Motor Movements are Fluid and Coordinated: No Coordination and Movement Description: Rigid movement, left hemiplegia Finger Nose Finger Test: no dysmetria noted in RUE Motor  Motor Motor: Hemiplegia;Abnormal tone Motor - Skilled Clinical Observations: Dense left hemiplegia, increased tension left elbow/hand.  Proximal weakness in RUE  Trunk/Postural Assessment  Cervical Assessment Cervical Assessment: Exceptions to [facility] (Incision left anterior neck) Thoracic Assessment Thoracic Assessment: Exceptions to [facility] Thoracic AROM Overall Thoracic AROM: Deficits;Due to premorid status Lumbar Assessment Lumbar Assessment: Exceptions to [facility] Lumbar  AROM Overall Lumbar AROM: Deficits;Due to premorid status Postural Control Postural Control: Deficits on evaluation Trunk Control: Inactivity Left trunk, stiffness throughout trunk Postural Limitations: Limited ability to flex forward for transitional movements - sit to stand, scooting, squatting  Balance Dynamic Sitting Balance Sitting balance - Comments: min assist static sitting - dynamic sitting limited due to stiffness Extremity/Trunk Assessment RUE Assessment Active Range of Motion (AROM) Comments: ~100 degrees of shoulder flexion - right General Strength Comments: difficulty raising arm beyond 100 degrees without significant trunk lateral lean, difficulty sustaining this end range LUE Assessment Passive Range of Motion (PROM) Comments: Could passively range LUE to 80 degrees without pain seated Active Range of Motion (AROM) Comments: Minimal active motion elbow, digits with delayed response time  Care Tool Care Tool Self Care Eating   Eating Assist Level: Moderate Assistance - Patient 50 - 74%    Oral Care    Oral Care Assist Level: Moderate Assistance - Patient 50 - 74%    Bathing      Total A         Upper Body Dressing(including orthotics)       Assist Level: Moderate Assistance - Patient 50 - 74%    Lower Body Dressing (excluding footwear)   What is the patient wearing?: Pants Assist for lower body dressing: Maximal Assistance - Patient 25 - 49%    Putting on/Taking off footwear   What is the patient wearing?: Socks;Shoes Assist for footwear: Dependent - Patient 0%       Care Tool Toileting Toileting activity   Assist for toileting: Total Assistance - Patient < 25% (indwelling catheter)     Care Tool Bed Mobility Roll left and right activity   Roll left and right assist level: Maximal Assistance - Patient 25 - 49%    Sit to lying activity        Lying to sitting edge of bed activity   Lying to sitting edge of bed assist level: Total Assistance -  Patient < 25%     Care Tool Transfers Sit to stand transfer   Sit to stand assist level: Maximal Assistance - Patient 25 - 49%    Chair/bed transfer   Chair/bed transfer assist level: Maximal Assistance - Patient 25 - 49%     Toilet  transfer         Care Tool Cognition Expression of Ideas and Wants Expression of Ideas and Wants: Some difficulty - exhibits some difficulty with expressing needs and ideas (e.g, some words or finishing thoughts) or speech is not clear   Understanding Verbal and Non-Verbal Content Understanding Verbal and Non-Verbal Content: Usually understands - understands most conversations, but misses some part/intent of message. Requires cues at times to understand   Memory/Recall Ability *first 3 days only Memory/Recall Ability *first 3 days only: That he or she is in a hospital/hospital unit;Current season    Refer to Care Plan for Long Term Goals  SHORT TERM GOAL WEEK 1 OT Short Term Goal 1 (Week 1): Patient will don pull over shirt with mod assist OT Short Term Goal 2 (Week 1): Patient will don shorts with mod assist OT Short Term Goal 3 (Week 1): Patient will complete grroming seated at sink with min assist OT Short Term Goal 4 (Week 1): Patient will use RUE to wash chest, thighs, LUE with min assist  Recommendations for other services: None    Skilled Therapeutic Intervention: Patient received in bed.  Wife reporting patient feeling nausea yet patient agreeable to getting up and out of bed to transport chair.  Patient able to stand from elevated surface with head of bed up.  Patient transitions sit to stand with max assist. Unable to step with left leg.  Best transfer was via squat pivot with arm of transport chair out.  Patient's wife present and extremely helpful this session.  Wife usually does majority of care for patient, but allows him to use RUE to wash and assist with dressing.   Patient left with personal care items in reach, wife present and safety  alarm belt in place.     ADL ADL Upper Body Bathing: Maximal assistance Where Assessed-Upper Body Bathing: Sitting at sink;Wheelchair Lower Body Bathing: Dependent Where Assessed-Lower Body Bathing: Wheelchair Upper Body Dressing: Maximal assistance Where Assessed-Upper Body Dressing: Wheelchair Lower Body Dressing: Dependent Where Assessed-Lower Body Dressing: Wheelchair Toileting: Dependent (Patient with indwelling catheter.  Difficulty stepping once standing) Toilet Transfer: Dependent Toilet Transfer Method: Stand pivot;Other (comment) (Used sit to stand lift at home) Science writer: Raised toilet seat;Other (comment) (stedy) Tub/Shower Transfer: Unable to assess Tub/Shower Transfer Method: Unable to assess (At home - used swivel tub transfer bench and stedy) Tub/Shower Equipment: Transfer tub bench ADL Comments: Wife heavilly involved in completing most of ADL. Reports patient just weaker now Mobility  Bed Mobility Bed Mobility: Rolling Right;Supine to Sit Rolling Right: Total Assistance - Patient < 25% Supine to Sit: Total Assistance - Patient < 25% Transfers Sit to Stand: Maximal Assistance - Patient 25-49% (Elevated bed)   Discharge Criteria: Patient will be discharged from OT if patient refuses treatment 3 consecutive times without medical reason, if treatment goals not met, if there is a change in medical status, if patient makes no progress towards goals or if patient is discharged from hospital.  The above assessment, treatment plan, treatment alternatives and goals were discussed and mutually agreed upon: by patient and by family  Mariah Milling 05/16/2021, 3:03 PM

## 2021-05-16 NOTE — Plan of Care (Signed)
  Problem: RH Swallowing Goal: LTG Patient will consume least restrictive diet using compensatory strategies with assistance (SLP) Description: LTG:  Patient will consume least restrictive diet using compensatory strategies with assistance (SLP) Flowsheets (Taken 05/16/2021 1745) LTG: Pt Patient will consume least restrictive diet using compensatory strategies with assistance of (SLP): Supervision   Problem: RH Expression Communication Goal: LTG Patient will increase word finding of common (SLP) Description: LTG:  Patient will increase word finding of common objects/daily info/abstract thoughts with cues using compensatory strategies (SLP). Flowsheets (Taken 05/16/2021 1745) LTG: Patient will increase word finding of common (SLP): Supervision Patient will use compensatory strategies to increase word finding of:  Daily info  Common objects   Problem: RH Problem Solving Goal: LTG Patient will demonstrate problem solving for (SLP) Description: LTG:  Patient will demonstrate problem solving for basic/complex daily situations with cues  (SLP) Flowsheets (Taken 05/16/2021 1745) LTG: Patient will demonstrate problem solving for (SLP): Basic daily situations LTG Patient will demonstrate problem solving for: Minimal Assistance - Patient > 75%   Problem: RH Memory Goal: LTG Patient will use memory compensatory aids to (SLP) Description: LTG:  Patient will use memory compensatory aids to recall biographical/new, daily complex information with cues (SLP) Flowsheets (Taken 05/16/2021 1745) LTG: Patient will use memory compensatory aids to (SLP): Supervision   Problem: RH Attention Goal: LTG Patient will demonstrate this level of attention during functional activites (SLP) Description: LTG:  Patient will will demonstrate this level of attention during functional activites (SLP) Flowsheets (Taken 05/16/2021 1745) Patient will demonstrate during cognitive/linguistic activities the attention type of:  Sustained Patient will demonstrate this level of attention during cognitive/linguistic activities in: Controlled LTG: Patient will demonstrate this level of attention during cognitive/linguistic activities with assistance of (SLP): Minimal Assistance - Patient > 75%

## 2021-05-17 LAB — GLUCOSE, CAPILLARY
Glucose-Capillary: 121 mg/dL — ABNORMAL HIGH (ref 70–99)
Glucose-Capillary: 150 mg/dL — ABNORMAL HIGH (ref 70–99)
Glucose-Capillary: 129 mg/dL — ABNORMAL HIGH (ref 70–99)
Glucose-Capillary: 141 mg/dL — ABNORMAL HIGH (ref 70–99)

## 2021-05-17 NOTE — Progress Notes (Signed)
PROGRESS NOTE   Subjective/Complaints:  No issues overnite  ROS- neg CP, SOB, N/V/D Objective:   No results found. Recent Labs    05/16/21 0432  WBC 6.5  HGB 13.3  HCT 41.7  PLT 269    Recent Labs    05/16/21 0432  NA 136  K 3.9  CL 104  CO2 25  GLUCOSE 146*  BUN 12  CREATININE 0.97  CALCIUM 9.0     Intake/Output Summary (Last 24 hours) at 05/17/2021 2376 Last data filed at 05/17/2021 0630 Gross per 24 hour  Intake 240 ml  Output 1750 ml  Net -1510 ml      Pressure Injury 01/05/21 Right Stage 2 -  Partial thickness loss of dermis presenting as a shallow open injury with a red, pink wound bed without slough. (Active)  01/05/21 0500  Location:   Location Orientation: Right  Staging: Stage 2 -  Partial thickness loss of dermis presenting as a shallow open injury with a red, pink wound bed without slough.  Wound Description (Comments):   Present on Admission: Yes    Physical Exam: Vital Signs Blood pressure (!) 159/80, pulse 82, temperature 98.5 F (36.9 C), resp. rate 18, height _0  (1.88 m), weight 102.9 kg, SpO2 99 %.   General: No acute distress Mood and affect are appropriate Heart: Regular rate and rhythm no rubs murmurs or extra sounds Lungs: Clear to auscultation, breathing unlabored, no rales or wheezes Abdomen: Positive bowel sounds, soft nontender to palpation, nondistended Extremities: No clubbing, cyanosis, or edema Skin: No evidence of breakdown, no evidence of rash Neurologic: Cranial nerves II through XII intact, motor strength is 5/5 in bilateral deltoid, bicep, tricep, grip, hip flexor, knee extensors, ankle dorsiflexor and plantar flexor   Assessment/Plan: 1. Functional deficits which require 3+ hours per day of interdisciplinary therapy in a comprehensive inpatient rehab setting. Physiatrist is providing close team supervision and 24 hour management of active medical problems  listed below. Physiatrist and rehab team continue to assess barriers to discharge/monitor patient progress toward functional and medical goals  Care Tool:  Bathing              Bathing assist       Upper Body Dressing/Undressing Upper body dressing   What is the patient wearing?: Pull over shirt    Upper body assist Assist Level: Moderate Assistance - Patient 50 - 74%    Lower Body Dressing/Undressing Lower body dressing      What is the patient wearing?: Pants     Lower body assist Assist for lower body dressing: Maximal Assistance - Patient 25 - 49%     Toileting Toileting    Toileting assist Assist for toileting: Total Assistance - Patient < 25%     Transfers Chair/bed transfer  Transfers assist     Chair/bed transfer assist level: Maximal Assistance - Patient 25 - 49%     Locomotion Ambulation   Ambulation assist      Assist level: Moderate Assistance - Patient 50 - 74% Assistive device: Orthosis (and wall rail) Max distance: 7'   Walk 10 feet activity   Assist  Walk 10 feet activity did not  occur: Safety/medical concerns        Walk 50 feet activity   Assist Walk 50 feet with 2 turns activity did not occur: Safety/medical concerns         Walk 150 feet activity   Assist Walk 150 feet activity did not occur: Safety/medical concerns         Walk 10 feet on uneven surface  activity   Assist Walk 10 feet on uneven surfaces activity did not occur: Safety/medical concerns         Wheelchair     Assist Will patient use wheelchair at discharge?: No   Wheelchair activity did not occur: N/A         Wheelchair 50 feet with 2 turns activity    Assist    Wheelchair 50 feet with 2 turns activity did not occur: N/A       Wheelchair 150 feet activity     Assist  Wheelchair 150 feet activity did not occur: N/A       Blood pressure (!) 159/80, pulse 82, temperature 98.5 F (36.9 C), resp. rate 18,  height _0  (1.88 m), weight 102.9 kg, SpO2 99 %.  Medical Problem List and Plan: 1.  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  History of recurrent DVT/antithrombotics: -DVT/anticoagulation:  Pharmaceutical: Other (comment)--on Eliquis--will be on Eliquis lifelong after d/w Neuro and IM.              -antiplatelet therapy: DAPT 3. Pain Management: N/A 4. Mood: LCSW to follow for evaluation and support             -antipsychotic agents: On Pimavanserin daily 5. Neuropsych: This patient may not be fully capable of making decisions on his own behalf. 6. Skin/Wound Care: Treat/monitor MASD on sacrum and continue pressure relief measures.Also has 2 small skin tears- con't desitin              -- Monitor nutritional status and offer supplements as needed Po intake.   --Will add Juven twice daily to promote wound healing. 7. Fluids/Electrolytes/Nutrition: Monitor intake/output.  Check c-Met in AM. 8.  T2DM: Resume metformin as > 48 hours since dye procedure. -- Will monitor blood sugars AC at bedtime.  Use SSI for elevated blood sugars as needed CBG (last 3)  Recent Labs    05/16/21 1629 05/16/21 2108 05/17/21 0603  GLUCAP 111* 177* 121*   Controlled 7/23  9.  Parkinson's disease: Tends to freeze per reports. On Azilect. Has slightly masked facies.  -- Continue Sinemet CR per home regimen--2.5 tabs BID during the day and one tap at nights. 10.  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 Louis Meckel) --last changed 7/13 by [facility]. 11.  History of CVA with left hemiplegia/dysphagia: Continue DAPT and statin. 12.  HTN: Monitor blood pressures 3 times daily with permissive hypertension systolic blood pressure around 170. Vitals:   05/16/21 1928 05/17/21 0531  BP: 131/78 (!) 159/80  Pulse: 76 82  Resp: 18 18  Temp: 98.1 F (36.7  C) 98.5 F (36.9 C)  SpO2: 98% 99%  Good range 7/23- still permissive at this time   13. Parkinson's Dementia?: Continue Namenda and Pimavanderin (Nuplazid) for mood stabilization.  14. Impacted cerumen in  ears- start Debrox drops x4 days both ears 15. Spasticity- has never been treated- might benefit from Botox of LUE.Discussed with pt, would consider this     LOS: 2 days A FACE TO Burns E Pattie Flaharty 05/17/2021, 8:42 AM

## 2021-05-17 NOTE — Progress Notes (Signed)
Physical Therapy Session Note  Patient Details  Name: Robert [name] MRN: [id number] Date of Birth: [birth year]  Today's Date: 05/17/2021 PT Individual Time: 0905-1000 PT Individual Time Calculation (min): 55 min   Short Term Goals: Week 1:  PT Short Term Goal 1 (Week 1): STG=LTG due to ELOS   Skilled Therapeutic Interventions/Progress Updates:   Pt received supine in bed and agreeable to PT. Supine>sit transfer with mod assist and cues for improved use of deep core to initiate anterior weight shift and trunkal rotation. Sit<>stand from EOB x 3 with increased time and mod assist. Pt reports feeling "unsteady" in standing orthostatic BP taken. Sitting EOB 153/81 HR 115. Unable to perform in standing due to significant use of R UE on bed rail to maintain balance. Once sitting in WC BP 123/80. HR 126.   Stand pivot transfer with UE support on bed rail and HW. Pt with significant difficulty motor planning turn to the R, requiring Mod assist from PT for LLE step length/width.   [facility] Sit<>stand x 3 from [facility] with mod assist. Noted to have improved motor planning to stand from WC< but required UE support on HW.  Standing balance to attempt vertical reach. Able to let go of HW x 2, but unable to reach to basketball rim on either attempt.   Gait training with HW x 12 ft with min assist from PT and +2 for WC follow. Pt initially demonstrating mild festination, but with cues to step LLE past the RLE, able to break festination.   Kinetron 4 x 45sec with min-mod assist to improve ROM on the LLE and limit festination with fatigue.   Patient returned to room and left sitting in [facility] with call bell in reach and all needs met.         Therapy Documentation Precautions:  Precautions Precautions: Fall Precaution Comments: L neck incision, hx of L hemiparesis Required Braces or Orthoses: Other Brace Other Brace: AFO LLE Restrictions Weight Bearing Restrictions:  No  Pain: Pain Assessment Pain Scale: 0-10 Pain Score: 0-No pain    Therapy/Group: Individual Therapy  Lorie Phenix 05/17/2021, 10:03 AM

## 2021-05-18 LAB — GLUCOSE, CAPILLARY
Glucose-Capillary: 147 mg/dL — ABNORMAL HIGH (ref 70–99)
Glucose-Capillary: 142 mg/dL — ABNORMAL HIGH (ref 70–99)
Glucose-Capillary: 142 mg/dL — ABNORMAL HIGH (ref 70–99)
Glucose-Capillary: 196 mg/dL — ABNORMAL HIGH (ref 70–99)

## 2021-05-18 NOTE — Progress Notes (Signed)
Occupational Therapy Session Note  Patient Details  Name: Robert [name] MRN: [id number] Date of Birth: 09-16-1946  Today's Date: 05/18/2021 OT Individual Time: 0950-1050   &   1400-1430 OT Individual Time Calculation (min): 60 min   &    30 min   Short Term Goals: Week 1:  OT Short Term Goal 1 (Week 1): Patient will don pull over shirt with mod assist OT Short Term Goal 2 (Week 1): Patient will don shorts with mod assist OT Short Term Goal 3 (Week 1): Patient will complete grroming seated at sink with min assist OT Short Term Goal 4 (Week 1): Patient will use RUE to wash chest, thighs, LUE with min assist  Skilled Therapeutic Interventions/Progress Updates:    AM session:   Patient in bed, alert and ready for therapy session.  Wife present and has patient dressed and ready for therapeutic activity.  Supine to sitting edge of bed with HOB elevated with min A and increased time.  Sit to stand with right bed rail min A but patient unable to pivot.  Reviewed patient's typical routine for transfer - note increased difficulty with moving he feet in stance at this time.  Sit pivot transfer more effective and safe at this time.  Sit pivot transfer bed to w/c with min/mod A and cues for repositioning after each scoot to the side.  Assisted to therapy gym via w/c - completed trunk mobility and left arm stretch/inhibition of flexors and facilitation of extensors - good results with flat hand paddle.  Mild increase in elbow extension, improved neutral position of arm at rest.  Sit to stand trails with mod A - difficulty reaching full standing position - appears fatigued.  Sit pivot transfer w/c to bed with min/mod A.  Sit to supine mod A with increased time.  He remained in bed at close of session.  Reviewed bed features with wife for improved upright positioning for meal time.  Bed alarm set and call bell in reach.     PM session:   Patient in bed, alert and ready for session, wife present.  Patient denies  pain.  Supine to sitting edge of bed with mod A.  Completed trunk mobility, light stretch, posture, left scapular mobility, weight bearing and seated balance activities with good tolerance - ongoing rigid posture limits mobility.  Sit pivot transfer bed to w/c with mod a.  Completed sit to stand with right bed rail min A - worked on posture in stance, weight shift and trunk mobility - able to tolerate standing position for 2 minutes.  He remained sitting in w/c at close of session, seat belt alarm set and call bell in reach, wife continues to be present and helpful with patient needs.       Therapy Documentation Precautions:  Precautions Precautions: Fall Precaution Comments: L neck incision, hx of L hemiparesis Required Braces or Orthoses: Other Brace Other Brace: AFO LLE Restrictions Weight Bearing Restrictions: No   Therapy/Group: Individual Therapy  Carlos Levering 05/18/2021, 7:33 AM

## 2021-05-18 NOTE — Progress Notes (Signed)
Speech Language Pathology Daily Session Note  Patient Details  Name: Robert [name] MRN: [id number] Date of Birth: 02-24-1946  Today's Date: 05/18/2021 SLP Individual Time: 1300-1345 SLP Individual Time Calculation (min): 45 min  Short Term Goals: Week 1: SLP Short Term Goal 1 (Week 1): Patient will consume current diet without overt s/s of aspiration with min A verbal cues for use of swallowing compensatory strategies SLP Short Term Goal 2 (Week 1): Patient will utilize external memory aids to recall new, daily information with min A verbal/visual cues SLP Short Term Goal 3 (Week 1): Patient will demonstrate selective attention to functional tasks for 10 miunute intervals with min A verbal cues for redirection SLP Short Term Goal 4 (Week 1): Patient will demonstrate functional problem solving witn milldy complex tasks with Min A verbal cues SLP Short Term Goal 5 (Week 1): Patient will utilize communication/word finding strategies at conversational level to repair communication breakdowns with min A verbal cues.  Skilled Therapeutic Interventions:  Pt was seen for skilled ST targeting cognitive goals.  Pt was awake but drowsy upon therapist's arrival and agreeable to participate in treatment.  SLP administered portions of the SLUMS cognitive evaluation for ongoing diagnostic treatment of cognition.  Evaluation was not completed due to increased time needed for subtests in light of Parkinson's, baseline cognitive deficits, and HOH.  Pt could recall 2 out of 5 words on the delayed recall subtest independently, improving to 5 out of 5 with min assist verbal cues.  He was able to produce the correct time on the clock drawing subtest but his hour markers were incorrectly spaced and pt was missing one number.  Pt could name 11 animals in one minute and was fully oriented to day of the week, year, and state.  Pt's wife reports that pt is near his baseline for cognition but pt reports that he feels it takes  him more time to process information.  All questions were answered to their satisfaction at this time.  Pt was left in bed with bed alarm set and call bell within reach.  Continue per current plan of care.   Pain Pain Assessment Pain Scale: 0-10 Pain Score: 0-No pain  Therapy/Group: Individual Therapy  Cheryal Salas, Selinda Orion 05/18/2021, 3:47 PM

## 2021-05-18 NOTE — Progress Notes (Signed)
Physical Therapy Session Note  Patient Details  Name: Robert [name] MRN: [id number] Date of Birth: [birth year]  Today's Date: 05/18/2021 PT Individual Time: [id number] PT Individual Time Calculation (min): 45 min   Short Term Goals: Week 1:  PT Short Term Goal 1 (Week 1): STG=LTG due to ELOS  Skilled Therapeutic Interventions/Progress Updates:    Pt received seated in transport chair in room, agreeable to PT session. No complaints of pain. Pt's wife Robert [name] present and hands-on during therapy session. Sit to stand with mod A to [facility] with cues for safe hand placement during transfer, anterior weight shift, and increased time needed to initiate transfer. Attempt stand pivot transfer to the L to mat table, pt "freezes" and unable to complete transfer safely. Pt perseverating on being tired and refuses suggested activities during session. Pt also very agitated and uncooperative during session, per his wife he does better in AM with therapies. Obtained 20x18 w/c for improved fit as pt's transport chair is 18" wide and a little narrow for his hips. Stand pivot transfer to the R transport chair to new w/c with HW and max A needed due to patient freezing and unable to follow verbal and tactile cues for sequencing. Pt exhibits more difficulty maneuvering LLE as compared to RLE. Pt taken back to room due to fatigue. Stand pivot transfer w/c to bed with mod A with increased time and cues needed to complete transfer. Pt does not fully turn to complete transfer and requires assist to safely scoot back once seated EOB. Assisted pt with changing out of shorts and t-shirt and into gown. Sit to supine mod A for BLE management. Pt left seated in bed with needs in reach, wife present. Pt missed 15 min of scheduled therapy session due to fatigue.  Therapy Documentation Precautions:  Precautions Precautions: Fall Precaution Comments: L neck incision, hx of L hemiparesis Required Braces or Orthoses: Other Brace Other  Brace: AFO LLE Restrictions Weight Bearing Restrictions: No General: PT Amount of Missed Time (min): 15 Minutes PT Missed Treatment Reason: Patient fatigue;Patient unwilling to participate      Therapy/Group: Individual Therapy   Excell Seltzer, PT, DPT, CSRS  05/18/2021, 5:16 PM

## 2021-05-19 DIAGNOSIS — R1312 Dysphagia, oropharyngeal phase: Secondary | ICD-10-CM

## 2021-05-19 LAB — CBC
HCT: 44.9 % (ref 39.0–52.0)
Hemoglobin: 14.4 g/dL (ref 13.0–17.0)
MCH: 28.7 pg (ref 26.0–34.0)
MCHC: 32.1 g/dL (ref 30.0–36.0)
MCV: 89.6 fL (ref 80.0–100.0)
Platelets: 313 10*3/uL (ref 150–400)
RBC: 5.01 MIL/uL (ref 4.22–5.81)
RDW: 13.6 % (ref 11.5–15.5)
WBC: 7.4 10*3/uL (ref 4.0–10.5)
nRBC: 0 % (ref 0.0–0.2)

## 2021-05-19 LAB — BASIC METABOLIC PANEL
Anion gap: 8 (ref 5–15)
BUN: 13 mg/dL (ref 8–23)
CO2: 27 mmol/L (ref 22–32)
Calcium: 9.2 mg/dL (ref 8.9–10.3)
Chloride: 99 mmol/L (ref 98–111)
Creatinine, Ser: 0.98 mg/dL (ref 0.61–1.24)
GFR, Estimated: >60 mL/min (ref >60)
Glucose, Bld: 150 mg/dL — ABNORMAL HIGH (ref 70–99)
Potassium: 4.1 mmol/L (ref 3.5–5.1)
Sodium: 134 mmol/L — ABNORMAL LOW (ref 135–145)

## 2021-05-19 LAB — GLUCOSE, CAPILLARY
Glucose-Capillary: 128 mg/dL — ABNORMAL HIGH (ref 70–99)
Glucose-Capillary: 156 mg/dL — ABNORMAL HIGH (ref 70–99)
Glucose-Capillary: 140 mg/dL — ABNORMAL HIGH (ref 70–99)
Glucose-Capillary: 150 mg/dL — ABNORMAL HIGH (ref 70–99)

## 2021-05-19 NOTE — Progress Notes (Signed)
Occupational Therapy Session Note  Patient Details  Name: Robert [name] MRN: [id number] Date of Birth: 1946-02-06  Today's Date: 05/19/2021 OT Individual Time: 1694-5038 OT Individual Time Calculation (min): 63 min    Short Term Goals: Week 1:  OT Short Term Goal 1 (Week 1): Patient will don pull over shirt with mod assist OT Short Term Goal 2 (Week 1): Patient will don shorts with mod assist OT Short Term Goal 3 (Week 1): Patient will complete grroming seated at sink with min assist OT Short Term Goal 4 (Week 1): Patient will use RUE to wash chest, thighs, LUE with min assist  Skilled Therapeutic Interventions/Progress Updates:    Pt received seated EOB, agreeable to OT, reporting no pain, wife present throughout session. Session focused on NMR, functional transfers, and blocked practice of sit<>stands and standing tolerance in preparation for improved independence in BADLs and functional mobility. Wife had completed all ADLs prior to therapy sessions. Squat pivot transfer mod A with increased time (for motor planning, following cues, processing) bed>w/c to R side requiring 2 attempts; first attempt pt demoed motor planning deficits requiring max A to scoot to R side; with wife requesting to demo "how they do it at home," pt completed squat pivot with +2 A mod A and mod vc's for BLE positioning.  Pt and wife reported after transfer it is easier to L side. Pt demoed motor planning difficulties maintaining supportive positioning with BLEs to increase BOS for pivoting and standing throughout session. Sit<>stand practice from bed and w/c 6x with mod A and wife assisting intermittently - progressing to CGA at sink. Mirror used at sink for [employer] as pt frequently leaned L. Pt demoed difficulties fully extending BLEs in stance and increased trunk/hip FLX despite facilitatory tactile and verbal cueing. Engaged in PROM and stretching of L hemi hand to extended position with pt maintaining well  until more trials of mobility; decreased tone noted at end of session compared to beginning. Education provided to pt and wife on positioning, w/c arm tray, and splinting to decrease prevalence of FLX contractures and risk of subluxation; wife asking about possibility of splinting in CIR and how to obtain arm tray for d/c; will inform primary OT. Pt remained seated in w/c, alarm set, call bell in reach, and all immediate needs met.   Wife became emotional during session; therapeutic listening and emotional support provided for wife and pt regarding POC and recovery process. Wife frequently provided increased vc's and completed tasks for pt (i.e. moving R foot in prep for standing). Education provided on allowing pt increased time for processing and attempting himself with wife verbally understanding.   Therapy Documentation Precautions:  Precautions Precautions: Fall Precaution Comments: L neck incision, hx of L hemiparesis Required Braces or Orthoses: Other Brace Other Brace: AFO LLE Restrictions Weight Bearing Restrictions: No  Pain: Pain Assessment Pain Scale: 0-10 Pain Score: 0-No pain   Therapy/Group: Individual Therapy  Mellissa Kohut 05/19/2021, 7:35 AM

## 2021-05-19 NOTE — Progress Notes (Addendum)
Occupational Therapy Session Note  Patient Details  Name: Robert [name] MRN: [id number] Date of Birth: [birth year]  Today's Date: 05/19/2021 OT Individual Time: 1120-1205 OT Individual Time Calculation (min): 45 min    Short Term Goals: Week 1:  OT Short Term Goal 1 (Week 1): Patient will don pull over shirt with mod assist OT Short Term Goal 2 (Week 1): Patient will don shorts with mod assist OT Short Term Goal 3 (Week 1): Patient will complete grroming seated at sink with min assist OT Short Term Goal 4 (Week 1): Patient will use RUE to wash chest, thighs, LUE with min assist  Skilled Therapeutic Interventions/Progress Updates:    Pt sitting up in w/c, wife at side.  No c/o pain.  Wife concerned about pt's left arm not being supported when sitting up as well as wanting him to wear a resting hand splint at night.  Therapist assessed LUE noting mild flexor tone in left shoulder primarily external rotation limited but able to achieve (30) degrees passively when placed in scapular plane.  Pt also noted with moderate flexor hypertonicity and spasticity in left wrist flexors and long finger flexors, 1+ to 2 on Modified Ashworth Scale.  Positioned LUE on left sided lap tray with pillow at axilla to promote slight shoulder abduction and external rotation, and rolled towels placed under hand for composite extension and gentle passive stretch at rest.  Placed visual handout over head of bed for left hemiparesis positioning in sitting/sidelying/supine to increase carryover with caregivers/staff. Call bell in reach, seat alarm on at end of session.   Therapy Documentation Precautions:  Precautions Precautions: Fall Precaution Comments: L neck incision, hx of L hemiparesis Required Braces or Orthoses: Other Brace Other Brace: AFO LLE Restrictions Weight Bearing Restrictions: No    Therapy/Group: Individual Therapy  Ezekiel Slocumb 05/19/2021, 3:06 PM

## 2021-05-19 NOTE — Progress Notes (Signed)
Speech Language Pathology Daily Session Note  Patient Details  Name: Robert [name] MRN: [id number] Date of Birth: Jun 25, 1946  Today's Date: 05/19/2021 SLP Individual Time: 1422-1516 SLP Individual Time Calculation (min): 54 min  Short Term Goals: Week 1: SLP Short Term Goal 1 (Week 1): Patient will consume current diet without overt s/s of aspiration with min A verbal cues for use of swallowing compensatory strategies SLP Short Term Goal 2 (Week 1): Patient will utilize external memory aids to recall new, daily information with min A verbal/visual cues SLP Short Term Goal 3 (Week 1): Patient will demonstrate selective attention to functional tasks for 10 miunute intervals with min A verbal cues for redirection SLP Short Term Goal 4 (Week 1): Patient will demonstrate functional problem solving witn milldy complex tasks with Min A verbal cues SLP Short Term Goal 5 (Week 1): Patient will utilize communication/word finding strategies at conversational level to repair communication breakdowns with min A verbal cues.  Skilled Therapeutic Interventions:Skilled ST services focused on cognitive skills. Pt's performance was greatly impacted by fatigue supported by wife as well as acute delayed processing. SLP facilitated basic problem solving skill in 3 step card sequence task, pt required max A verbal cues fading to mod A verbal cues, but only after demonstration. Pt was able to verbalize sequence with min A verbal cues but demonstrated increase difficulty functional sequencing cards. Pt's wife reports pt is more alert in the am verse pm. Pt was left in room with call bell within reach and bed alarm set. SLP recommends to continue skilled services.     Pain Pain Assessment Pain Score: 0-No pain  Therapy/Group: Individual Therapy  Tyric Rodeheaver  [facility] 05/19/2021, 3:41 PM

## 2021-05-19 NOTE — Progress Notes (Signed)
PROGRESS NOTE   Subjective/Complaints:  Pt up in the bed feeding himself. Nurse had mentioned that he often relied on wife to feed, but he was doing it himself this morning. Denied pain. Slept ok.   ROS: limited due to language/communication     Objective:   No results found. Recent Labs    05/19/21 0710  WBC 7.4  HGB 14.4  HCT 44.9  PLT 313   Recent Labs    05/19/21 0710  NA 134*  K 4.1  CL 99  CO2 27  GLUCOSE 150*  BUN 13  CREATININE 0.98  CALCIUM 9.2    Intake/Output Summary (Last 24 hours) at 05/19/2021 1402 Last data filed at 05/19/2021 0700 Gross per 24 hour  Intake 338 ml  Output 1000 ml  Net -662 ml     Pressure Injury 01/05/21 Right Stage 2 -  Partial thickness loss of dermis presenting as a shallow open injury with a red, pink wound bed without slough. (Active)  01/05/21 0500  Location:   Location Orientation: Right  Staging: Stage 2 -  Partial thickness loss of dermis presenting as a shallow open injury with a red, pink wound bed without slough.  Wound Description (Comments):   Present on Admission: Yes    Physical Exam: Vital Signs Blood pressure 133/67, pulse 90, temperature 99.1 F (37.3 C), temperature source Oral, resp. rate 20, height $RemoveBe'6\' 2"'UWGYHKEOe$  (1.88 m), weight 102.9 kg, SpO2 96 %.   Constitutional: No distress . Vital signs reviewed. HEENT: EOMI, oral membranes moist Neck: supple Cardiovascular: RRR without murmur. No JVD    Respiratory/Chest: CTA Bilaterally without wheezes or rales. Normal effort    GI/Abdomen: BS +, non-tender, non-distended Ext: no clubbing, cyanosis, or edema Psych: pleasant and cooperative  Skin: No evidence of breakdown, no evidence of rash Neurologic: HOH, a little slow to process, word finding deficits. Masked facies. Speech dysarthric. Cranial nerves II through XII intact, motor strength is 5/5 in bilateral deltoid, bicep, tricep, grip, hip flexor, knee  extensors, ankle dorsiflexor and plantar flexor   Assessment/Plan: 1. Functional deficits which require 3+ hours per day of interdisciplinary therapy in a comprehensive inpatient rehab setting. Physiatrist is providing close team supervision and 24 hour management of active medical problems listed below. Physiatrist and rehab team continue to assess barriers to discharge/monitor patient progress toward functional and medical goals  Care Tool:  Bathing              Bathing assist Assist Level: Total Assistance - Patient < 25% (Per OT eval)     Upper Body Dressing/Undressing Upper body dressing   What is the patient wearing?: Pull over shirt    Upper body assist Assist Level: Total Assistance - Patient < 25%    Lower Body Dressing/Undressing Lower body dressing      What is the patient wearing?: Pants     Lower body assist Assist for lower body dressing: Maximal Assistance - Patient 25 - 49%     Toileting Toileting    Toileting assist Assist for toileting: 2 Helpers     Transfers Chair/bed transfer  Transfers assist     Chair/bed transfer assist level: Maximal Assistance -  Patient 25 - 49%     Locomotion Ambulation   Ambulation assist      Assist level: Moderate Assistance - Patient 50 - 74% Assistive device: Orthosis (and wall rail) Max distance: 7'   Walk 10 feet activity   Assist  Walk 10 feet activity did not occur: Safety/medical concerns        Walk 50 feet activity   Assist Walk 50 feet with 2 turns activity did not occur: Safety/medical concerns         Walk 150 feet activity   Assist Walk 150 feet activity did not occur: Safety/medical concerns         Walk 10 feet on uneven surface  activity   Assist Walk 10 feet on uneven surfaces activity did not occur: Safety/medical concerns         Wheelchair     Assist Will patient use wheelchair at discharge?: No   Wheelchair activity did not occur: N/A          Wheelchair 50 feet with 2 turns activity    Assist    Wheelchair 50 feet with 2 turns activity did not occur: N/A       Wheelchair 150 feet activity     Assist  Wheelchair 150 feet activity did not occur: N/A       Blood pressure 133/67, pulse 90, temperature 99.1 F (37.3 C), temperature source Oral, resp. rate 20, height $RemoveBe'6\' 2"'ZomzNGkEm$  (1.88 m), weight 102.9 kg, SpO2 96 %.  Medical Problem List and Plan: 1.  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  History of recurrent DVT/antithrombotics: -DVT/anticoagulation:  Pharmaceutical: Other (comment)--on Eliquis--will be on Eliquis lifelong after d/w Neuro and IM.              -antiplatelet therapy: DAPT 3. Pain Management: N/A 4. Mood: LCSW to follow for evaluation and support             -antipsychotic agents: On Pimavanserin daily 5. Neuropsych: This patient may not be fully capable of making decisions on his own behalf. 6. Skin/Wound Care: Treat/monitor MASD on sacrum and continue pressure relief measures.Also has 2 small skin tears- con't desitin              -- Monitor nutritional status and offer supplements as needed Po intake.   --added Juven twice daily to promote wound healing. 7. Fluids/Electrolytes/Nutrition: Monitor intake/output.  Check c-Met in AM. 8.  T2DM: Resume metformin as > 48 hours since dye procedure. -- Will monitor blood sugars AC at bedtime.  Use SSI for elevated blood sugars as needed CBG (last 3)  Recent Labs    05/18/21 2111 05/19/21 0558 05/19/21 1134  GLUCAP 196* 128* 156*  Borderline 7/25. Only receives metformin at bedtime. Might benefit from AM metformin dose  9.  Parkinson's disease: Tends to freeze per reports. On Azilect. Has slightly masked facies.  -- Continue Sinemet CR per home  regimen--2.5 tabs BID during the day and one tap at nights. 10.  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 Louis Meckel) --last changed 7/13 by [facility]. 11.  History of CVA with left hemiplegia/dysphagia: Continue DAPT and statin. 12.  HTN: Monitor  blood pressures 3 times daily with permissive hypertension systolic blood pressure around 170. Vitals:   05/18/21 1904 05/19/21 0426  BP: 116/60 133/67  Pulse: 78 90  Resp: 20 20  Temp: (!) 97.4 F (36.3 C) 99.1 F (37.3 C)  SpO2: 99% 96%  Good range 7/25- still permissive at this time   13. Parkinson's Dementia?: Continue Namenda and Pimavanderin (Nuplazid) for mood stabilization.  14. Impacted cerumen in ears- start Debrox drops x4 days both ears 15. 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

## 2021-05-19 NOTE — Progress Notes (Addendum)
Patient ID: Doctor Sheahan, male   DOB: 01-Jul-1946, 75 y.o.   MRN: [id number] Met with the patient and wife to review nurse CM role and review educational needs. Discussed secondary risk factors including DM (A1C 6.4), HTN, HLD (LDL 38) and dietary modifications/medications. Continue to follow along to discharge to address educational needs and collaborate with the SW to facilitate preparation for discharge. Forwarded concerns about earwax to PA and DME/HH vs OP services to SW for follow up. Margarito Liner, RN

## 2021-05-19 NOTE — Progress Notes (Signed)
Physical Therapy Session Note  Patient Details  Name: Robert [name] MRN: [id number] Date of Birth: January 18, 1946  Today's Date: 05/20/2021 PT Individual Time: [id number]  PT Individual Time Calculation (min): 45 min    Short Term Goals: Week 1:  PT Short Term Goal 1 (Week 1): STG=LTG due to ELOS  Skilled Therapeutic Interventions/Progress Updates:  Patient seated upright in w/c on entrance to room. Wife present and preparing pt for PT session. Patient alert and agreeable to PT session. Patient denied pain during session.  Therapeutic Activity: Bed Mobility: Patient performed sit-->supine with Min/ mod A from wife for bringing BLE  up to bed surface. VC/ tc required for pt paired effort and technique. Transfers: Patient performed squat pivot transfers toward R side throughout session to several heights and surfaces with Mod A and one SPVT transfer to R side with Max A. All performed with unknown amount of assist from wife. Provided verbal cues for positioning of BLE, UE placement, forward lean, effort. In SPVT, pt requires max A to complete for weight shift and pivot step with LLE to complete w/c--> bed toward R side.   Neuromuscular Re-ed: NMR facilitated during session with focus on continuous reciprocation of BUE and BLE using NuStep L3 x 34mn with focus on LLE motor control and strengthening. Support given to LUE at elbow and at L knee to reduce L hip ER and align LLE in more neutral position with pt fatigue. Pt requires extensive education in reciprocation for relaxation of RLE for extension of LLE. Once pt produces correct voluntary movement, is able to continue reciprocation throughout rest of bout with one brief rest break. NMR performed for improvements in motor control and coordination, balance, sequencing, judgement, and self confidence/ efficacy in performing all aspects of mobility at highest level of independence.   SPVT transfer to R side back to bed with Max A for LLE pivot  stepping. Patient seated EOB and wife assisting with doffing pants, shoes, brace, and shirt in order to don gown to ready pt for bed. Assists pt to bed with ModA for BLE. Pt supine  in bed at end of session with brakes locked, bed alarm set, and all needs within reach.     Therapy Documentation Precautions:  Precautions Precautions: Fall Precaution Comments: L neck incision, hx of L hemiparesis Required Braces or Orthoses: Other Brace Other Brace: AFO LLE Restrictions Weight Bearing Restrictions: No  Therapy/Group: Individual Therapy  JAlger SimonsPT, DPT 05/19/2021, 5:32 PM

## 2021-05-20 DIAGNOSIS — I10 Essential (primary) hypertension: Secondary | ICD-10-CM | POA: Diagnosis not present

## 2021-05-20 DIAGNOSIS — G20 Parkinson's disease: Secondary | ICD-10-CM | POA: Diagnosis not present

## 2021-05-20 DIAGNOSIS — I639 Cerebral infarction, unspecified: Secondary | ICD-10-CM | POA: Diagnosis not present

## 2021-05-20 DIAGNOSIS — G8194 Hemiplegia, unspecified affecting left nondominant side: Secondary | ICD-10-CM | POA: Diagnosis not present

## 2021-05-20 DIAGNOSIS — N312 Flaccid neuropathic bladder, not elsewhere classified: Secondary | ICD-10-CM | POA: Diagnosis not present

## 2021-05-20 DIAGNOSIS — K219 Gastro-esophageal reflux disease without esophagitis: Secondary | ICD-10-CM | POA: Diagnosis not present

## 2021-05-20 LAB — GLUCOSE, CAPILLARY
Glucose-Capillary: 125 mg/dL — ABNORMAL HIGH (ref 70–99)
Glucose-Capillary: 127 mg/dL — ABNORMAL HIGH (ref 70–99)
Glucose-Capillary: 138 mg/dL — ABNORMAL HIGH (ref 70–99)
Glucose-Capillary: 140 mg/dL — ABNORMAL HIGH (ref 70–99)

## 2021-05-20 NOTE — Progress Notes (Signed)
Orthopedic Tech Progress Note Patient Details:  Robert [name] 1946/06/01 [id number]  Patient ID: [name], male   DOB: 1946/04/27, 75 y.o.   MRN: [id number] Called order into hanger Karolee Stamps 05/20/2021, 7:02 AM

## 2021-05-20 NOTE — Progress Notes (Signed)
Physical Therapy Session Note  Patient Details  Name: Robert [name] MRN: [id number] Date of Birth: [birth year]  Today's Date: 05/20/2021 PT Individual Time: 0908-1005 PT Individual Time Calculation (min): 57 min   Short Term Goals: Week 1:  PT Short Term Goal 1 (Week 1): STG=LTG due to ELOS  Skilled Therapeutic Interventions/Progress Updates:  Patient supine in bed on entrance to room. Wife present. Patient alert and agreeable to PT session. Patient denied pain during session.   Therapeutic Activity: Bed Mobility: Able to follow instructions for bringing RLE to EOB and requiring vc with CGA/ Min A for LLE to reach EOB.  Requires MinA/ CGA to bring upper body to upright seated position with vc/ tc to bring L shoulder and hip around to square self on EOB. Extra time required to complete reaching seated position on EOB. Transfers: Attempt for SPVT using HW from bed--> w/c toward L side with pt flustered and perseverating on PT being in the way when guarding pt for assist. Adjust w/c position and pt able to perform squat pivot to w/c desiring to perform on his own but requiring Mod A to complete clearance and distance. VC for pt to adjust in seat with pt able to complete to upright seated position with CGA.   STS in parallel bars using pull-to-stand technique with Min/ CGA. Attempt to push to stand from w/c in bars with pt stating that parallel bar is in way of elbow and can't complete. Attempt for STS to [facility] requiring Mod/ Max A with pt unable to lean forward. After NMR, slight improvement in forward lean, but pt continues to require significant physical assist to lift from seat to stand. Multimodal cues throughout for forward lean, weight shift, technique.   Neuromuscular Re-ed: NMR facilitated during session with focus on standing lateral weight shift and forward lean from sit to standing. Pt guided in lateral weight shift while standing in parallel bars with pt progressing to lateral shift with  quick contralateral lift of foot from floor. Good strength noted bilaterally for LE into extension with no buckling.  Pt guided in sit to forward lean onto armchair with RUE on armrest of chair in order to promote supported forward lean. Pt's Parkinson's diagnosis does present challenge for movements into flexion, but he is able to perform with hand on armrest and lift from seat to demonstrate body's ability to perform without fallling as well as required movements in order to promote ease into upright stance. Performed x8 with attempt to progress to palm flat on chair seat, but pt states this is too hard and brings hand back to armrest.  NMR performed for improvements in motor control and coordination, balance, sequencing, judgement, and self confidence/ efficacy in performing all aspects of mobility at highest level of independence.   Wife states that at previous SNF stay, pt was able to propel w/c in facility with RUE and BLE. Unable to use w/c effectively at home since space is limited.   Patient seated upright  in w/c at end of session with brakes locked, wife present, and OT entering room to begin next session.      Therapy Documentation Precautions:  Precautions Precautions: Fall Precaution Comments: L neck incision, hx of L hemiparesis Required Braces or Orthoses: Other Brace Other Brace: AFO LLE Restrictions Weight Bearing Restrictions: No  Therapy/Group: Individual Therapy  [name] PT, DPT 05/20/2021, 12:44 PM

## 2021-05-20 NOTE — Progress Notes (Signed)
Speech Language Pathology Daily Session Note  Patient Details  Name: Robert [name] MRN: [id number] Date of Birth: [birth year]  Today's Date: 05/20/2021 SLP Individual Time: 1115-1200 SLP Individual Time Calculation (min): 45 min  Short Term Goals: Week 1: SLP Short Term Goal 1 (Week 1): Patient will consume current diet without overt s/s of aspiration with min A verbal cues for use of swallowing compensatory strategies SLP Short Term Goal 2 (Week 1): Patient will utilize external memory aids to recall new, daily information with min A verbal/visual cues SLP Short Term Goal 3 (Week 1): Patient will demonstrate selective attention to functional tasks for 10 miunute intervals with min A verbal cues for redirection SLP Short Term Goal 4 (Week 1): Patient will demonstrate functional problem solving witn milldy complex tasks with Min A verbal cues SLP Short Term Goal 5 (Week 1): Patient will utilize communication/word finding strategies at conversational level to repair communication breakdowns with min A verbal cues.  Skilled Therapeutic Interventions: Patient agreeable to skilled ST intervention with focus on cognitive-linguistic goals. Patient was sleeping on arrival and easily aroused with verbal cues. Facilitated basic verbal reasoning, thought organization, and working memory task with min-to-mod A verbal cues for working memory (inconsistently recalled what was previously discussed), organization, and reasoning. Patient benefited from additional processing time and verbal repetition 2/2 hearing loss. Patient exhibited occasional word finding difficulty during structured cognitive-linguistic tasks. Educated patient on description strategy using semantic features. Patient demonstrated understanding through demonstration during structured verbal description task with min A verbal cues for effectiveness. Patient was left in bed with alarm activated and immediate needs within reach at end of session.  Spouse at bedside. Continue per current plan of care.    Pain Pain Assessment Pain Scale: 0-10 Pain Score: 0-No pain  Therapy/Group: Individual Therapy  Patty Sermons 05/20/2021, 11:27 AM

## 2021-05-20 NOTE — Progress Notes (Signed)
PROGRESS NOTE   Subjective/Complaints: No pains overnite , still tight in Left hand  Pt's wife states he is having problem hearing due to earwax  ROS: limited due to language/communication     Objective:   No results found. Recent Labs    05/19/21 0710  WBC 7.4  HGB 14.4  HCT 44.9  PLT 313    Recent Labs    05/19/21 0710  NA 134*  K 4.1  CL 99  CO2 27  GLUCOSE 150*  BUN 13  CREATININE 0.98  CALCIUM 9.2     Intake/Output Summary (Last 24 hours) at 05/20/2021 0800 Last data filed at 05/20/2021 0545 Gross per 24 hour  Intake 537 ml  Output 2800 ml  Net -2263 ml      Pressure Injury 01/05/21 Right Stage 2 -  Partial thickness loss of dermis presenting as a shallow open injury with a red, pink wound bed without slough. (Active)  01/05/21 0500  Location:   Location Orientation: Right  Staging: Stage 2 -  Partial thickness loss of dermis presenting as a shallow open injury with a red, pink wound bed without slough.  Wound Description (Comments):   Present on Admission: Yes    Physical Exam: Vital Signs Blood pressure (!) 141/74, pulse 90, temperature 98.1 F (36.7 C), temperature source Oral, resp. rate 18, height _0  (1.88 m), weight 102.9 kg, SpO2 98 %.    General: No acute distress Mood and affect are appropriate Heart: Regular rate and rhythm no rubs murmurs or extra sounds Lungs: Clear to auscultation, breathing unlabored, no rales or wheezes Abdomen: Positive bowel sounds, soft nontender to palpation, nondistended Extremities: No clubbing, cyanosis, or edema Skin: No evidence of breakdown, no evidence of rash   Skin: No evidence of breakdown, no evidence of rash Neurologic: HOH, a little slow to process, word finding deficits. Masked facies. Speech dysarthric. Cranial nerves II through XII intact, motor strength is 5/5 in right and 3-/5 left deltoid, bicep, tricep,  hip flexor, knee  extensors, ankle dorsiflexor and plantar flexor Has good grasp but limited release due to increased Left finger and wrist flexor tone    Assessment/Plan: 1. Functional deficits which require 3+ hours per day of interdisciplinary therapy in a comprehensive inpatient rehab setting. Physiatrist is providing close team supervision and 24 hour management of active medical problems listed below. Physiatrist and rehab team continue to assess barriers to discharge/monitor patient progress toward functional and medical goals  Care Tool:  Bathing              Bathing assist Assist Level: Total Assistance - Patient < 25% (Per OT eval)     Upper Body Dressing/Undressing Upper body dressing   What is the patient wearing?: Pull over shirt    Upper body assist Assist Level: Total Assistance - Patient < 25%    Lower Body Dressing/Undressing Lower body dressing      What is the patient wearing?: Pants     Lower body assist Assist for lower body dressing: Maximal Assistance - Patient 25 - 49%     Toileting Toileting    Toileting assist Assist for toileting: 2 Helpers  Transfers Chair/bed transfer  Transfers assist     Chair/bed transfer assist level: Maximal Assistance - Patient 25 - 49%     Locomotion Ambulation   Ambulation assist      Assist level: Moderate Assistance - Patient 50 - 74% Assistive device: Orthosis (and wall rail) Max distance: 7'   Walk 10 feet activity   Assist  Walk 10 feet activity did not occur: Safety/medical concerns        Walk 50 feet activity   Assist Walk 50 feet with 2 turns activity did not occur: Safety/medical concerns         Walk 150 feet activity   Assist Walk 150 feet activity did not occur: Safety/medical concerns         Walk 10 feet on uneven surface  activity   Assist Walk 10 feet on uneven surfaces activity did not occur: Safety/medical concerns         Wheelchair     Assist Will  patient use wheelchair at discharge?: No   Wheelchair activity did not occur: N/A         Wheelchair 50 feet with 2 turns activity    Assist    Wheelchair 50 feet with 2 turns activity did not occur: N/A       Wheelchair 150 feet activity     Assist  Wheelchair 150 feet activity did not occur: N/A       Blood pressure (!) 141/74, pulse 90, temperature 98.1 F (36.7 C), temperature source Oral, resp. rate 18, height _0  (1.88 m), weight 102.9 kg, SpO2 98 %.  Medical Problem List and Plan: 1.  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  History of recurrent DVT/antithrombotics: -DVT/anticoagulation:  Pharmaceutical: Other (comment)--on Eliquis--will be on Eliquis lifelong after d/w Neuro and IM.              -antiplatelet therapy: DAPT 3. Pain Management: N/A 4. Mood: LCSW to follow for evaluation and support             -antipsychotic agents: On Pimavanserin daily 5. Neuropsych: This patient may not be fully capable of making decisions on his own behalf. 6. Skin/Wound Care: Treat/monitor MASD on sacrum and continue pressure relief measures.Also has 2 small skin tears- con't desitin              -- Monitor nutritional status and offer supplements as needed Po intake.   --added Juven twice daily to promote wound healing. 7. Fluids/Electrolytes/Nutrition: Monitor intake/output.  Check c-Met in AM. 8.  T2DM: Resume metformin as > 48 hours since dye procedure. -- Will monitor blood sugars AC at bedtime.  Use SSI for elevated blood sugars as needed CBG (last 3)  Recent Labs    05/19/21 1628 05/19/21 2046 05/20/21 0555  GLUCAP 140* 150* 125*   Controlled 7/26  9.  Parkinson's disease: Tends to freeze per reports. On Azilect. Has slightly masked facies.  -- Continue  Sinemet CR per home regimen--2.5 tabs BID during the day and one tap at nights. 10.  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 Louis Meckel) --last changed 7/13 by [facility]. 11.  History of CVA with left hemiplegia/dysphagia:  Continue DAPT and statin. 12.  HTN: Monitor blood pressures 3 times daily with permissive hypertension systolic blood pressure around 170. Vitals:   05/19/21 1931 05/20/21 0441  BP: 117/72 (!) 141/74  Pulse: 78 90  Resp: 17 18  Temp: 98 F (36.7 C) 98.1 F (36.7 C)  SpO2: 96% 98%  Good range 7/26- still permissive at this time   13. Parkinson's Dementia?: Continue Namenda and Pimavanderin (Nuplazid) for mood stabilization.  14. Impacted cerumen in ears- start Debrox drops x4 days both ears 15. Spasticity- has never been treated- might benefit from Botox of LUE.Discussed with pt, would consider this     LOS: 5 days A FACE TO FACE EVALUATION WAS PERFORMED  Charlett Blake 05/20/2021, 8:00 AM

## 2021-05-20 NOTE — Progress Notes (Signed)
Occupational Therapy Session Note  Patient Details  Name: Robert [name] MRN: [id number] Date of Birth: 09-Jun-1946  Today's Date: 05/20/2021 OT Individual Time: 0700-0724 OT Individual Time Calculation (min): 24 min + 57 min   Short Term Goals: Week 1:  OT Short Term Goal 1 (Week 1): Patient will don pull over shirt with mod assist OT Short Term Goal 2 (Week 1): Patient will don shorts with mod assist OT Short Term Goal 3 (Week 1): Patient will complete grroming seated at sink with min assist OT Short Term Goal 4 (Week 1): Patient will use RUE to wash chest, thighs, LUE with min assist  Skilled Therapeutic Interventions/Progress Updates:     Session 1 (0300-9233): Pt received semi-reclined in bed, eating breakfast, denies pain throughout session, agreeable to therapy. Session focus on self-care retraining, activity tolerance, family education and LUE PROM/therapeutic positioning in prep for improved ADL/func mobility performance + decreased caregiver burden. Pt self-fed self ~3 bites of breakfast, noted to scoop food out from plate > table for easier access. Pt distracted with conversation and req min Vcs to redirect. Wife then present for remainder of session, providing pt with larger spoon to finish eating breakfast. Wife states primary concern/goal for therapy is for pt to work on [place] transfers and to have use of "arms and legs." Reports that "pt cannot dress himself" and she has been completing ADL for him during hospitalization. PROM to LUE for assessment and tone management to prevent increased risk of contracture done at each joint for 1x5. Denies pain throughout and noted improvement in tone by end of session. Modified [place] : 2 in L elbow and L digits Supported L hand on rolled up towel to provide gentle stretch and educated on therapeutic positioning. Pt to receive prefab resting hand splint this date, provided sign in room for nighttime wear schedule.   Pt left semi-reclined  in bed with wife present, with bed alarm engaged, call bell in reach, and all immediate needs met.    Session 2 [phone]): Pt received semi-reclined in bed with wife present, agreeable to therapy. Denies pain throughout session. Session focus on self-care retraining, activity tolerance, anterior WS, functional transfers, and BUE/core strengthening , BUE NMR in prep for improved ADL/func mobility performance + decreased caregiver burden. Came to sitting EOB with max A to progress BLE off bed and to lift trunk, assists with RUE on bed rail. STS with use of HW and mod A to power up from elevated surface, pt becoming frustrated with therapist "in the way." Wife states pt has become frustrated being unable to complete transfers as prior to his second stroke. Stand-pivot with mod A and use of bed rail and increased time/Vcs for sequencing steps > w/c. Total A to don/doff B shoes/L AFO . Total A transport to and from therapy gym. Squat-pivot from w/c > mat table on pt's R with max A to lift and pivot hips. Practiced 1x15 of reaching above head, across midline, in front, and out to his side with RUE. Additionally completed 1x20 of forward towel slides with BUE. SB transfer > pt's R with overall min A and mod visual and verbal cues for sequencing. Stedy transfer back to bed in room 2/2 time management and energy conservation. Max to total A from wife to change clothes into hospital gown, pt able to thread and remove RUE to assist. Max A to return to supine and + 2 to boost up in bed.   Pt left semi-reclined in bed with  wife present, wife cleared for transfers in previous session. Call bell in reach, and all immediate needs met.   Therapy Documentation Precautions:  Precautions Precautions: Fall Precaution Comments: L neck incision, hx of L hemiparesis Required Braces or Orthoses: Other Brace Other Brace: AFO LLE Restrictions Weight Bearing Restrictions: No  Pain: see session notes   ADL: See Care Tool  for more details.  Therapy/Group: Individual Therapy  Volanda Napoleon MS, OTR/L  05/20/2021, 6:54 AM

## 2021-05-20 NOTE — Progress Notes (Signed)
Occupational Therapy Session Note  Patient Details  Name: Robert [name] MRN: [id number] Date of Birth: Oct 23, 1946  Today's Date: 05/20/2021 OT Individual Time: 0930-1000 OT Individual Time Calculation (min): 30 min    Short Term Goals: Week 1:  OT Short Term Goal 1 (Week 1): Patient will don pull over shirt with mod assist OT Short Term Goal 2 (Week 1): Patient will don shorts with mod assist OT Short Term Goal 3 (Week 1): Patient will complete grroming seated at sink with min assist OT Short Term Goal 4 (Week 1): Patient will use RUE to wash chest, thighs, LUE with min assist  Skilled Therapeutic Interventions/Progress Updates:    Pt seen this session with his wife present to focus on pre standing skills of forward weight shifts, lifting hips and then progressing to a stand 3x.  To help pt with the process of standing had him say out loud "UP UP UP" and then "down down down"  for sitting. This seemed to really help.  In standing pt felt that I was pushing him to the R - may be due to decreased midline awareness. LUE was more relaxed and it was fairly easy to fully stretch his arm out.  Active trunk rotation movement to relax arm.  Limited rotation to his R.    Pt participated well. Resting in wc with alarm on and all needs met.   Therapy Documentation Precautions:  Precautions Precautions: Fall Precaution Comments: L neck incision, hx of L hemiparesis Required Braces or Orthoses: Other Brace Other Brace: AFO LLE Restrictions Weight Bearing Restrictions: No    Pain: Pain Assessment Pain Scale: 0-10 Pain Score: 0-No pain   Therapy/Group: Individual Therapy  [place] 05/20/2021, 1:02 PM

## 2021-05-21 LAB — GLUCOSE, CAPILLARY
Glucose-Capillary: 133 mg/dL — ABNORMAL HIGH (ref 70–99)
Glucose-Capillary: 160 mg/dL — ABNORMAL HIGH (ref 70–99)
Glucose-Capillary: 147 mg/dL — ABNORMAL HIGH (ref 70–99)
Glucose-Capillary: 163 mg/dL — ABNORMAL HIGH (ref 70–99)

## 2021-05-21 NOTE — Progress Notes (Signed)
Physical Therapy Session Note  Patient Details  Name: Robert [name] MRN: [id number] Date of Birth: [birth year]  Today's Date: 05/21/2021 PT Individual Time: 0902-1012; 4098-1191 PT Individual Time Calculation (min): 70 min and 26 mins  Short Term Goals: Week 1:  PT Short Term Goal 1 (Week 1): STG=LTG due to ELOS  Skilled Therapeutic Interventions/Progress Updates:    Session One: Pt received sitting EOB via hand off from SLP. Pt is agreeable to physical therapy session and states no pain. Gait belt placed prior to initiation of mobility.  Pt completed transfers to the right with MinA with +2 wc for safety, regressing to MaxA with +2 wc for safety at the end of session secondary to fatigue. Pt freezes during turning movements, requiring STS with a few steps forward and have the wc brought up behind him during transfers. Stand pivot to nustep ModA manual facilitation of LLE/steadying and one instance of +2 to maintain standing balance secondary to fatigue and freezing.   STS throughout session progressed from heavy modA to MinA by the end of the session for stabilization while patient gains his balance. Verbal and visual cuing for hand placement and counting to 3 for cuing with momentum.   Gait training completed 86ft, 56ft, and 28ft MinA with intermittent Mod-MaxA for steadying and maintaining balance during freezing episodes. Consistent cuing for sequencing to encourage continuous movement and minimize freezing episodes. Pt demonstrates decreased step length bilaterally (LLE>RLE), decreased bilateral weight shifting, and decreased gait speed. Pt improved gait speed with external cuing during first bout of training with increased freezing and fatigue in following bouts. When fatigued, pt impulsively attempts to sit before wc brought up behind him.   Nustep complete at level 4 for 6 minutes to encourage reciprocal movement of BLE. Therapist provided manual facilitation to LLE knee for neutral  alignment during activity.   Wc>bed transfer with modified stand pivot with RUE support on bed railing MaxA for facilitation of hips, LLE placement, and steadying balance. Sitting EOB>supine ModA with faciliation of BLE while patient control trunk and BUE. Therapist assisted with readjustment of shoulders once supine and pillow positioning under LUE.   Pt requires increased time to initiate/complete tasks with multiple episodes of freezing and frequent rest breaks throughout session. Pt in bed with HOB elevated, bed alarm on, and call bell within reach. No further needs expressed at this time.   Session Two:  Pt received sitting EOB with wife. Pt agreeable to therapy session, however, states he has been feeling very fatigued lately. Pt education on impact to endurance post parkinson's disease/stroke and importance to stay active with rest breaks.   This session focused on rotational movements sitting EOB with progressing ROM. Trunk rotations with BUE holding bat to tap targets R/L, increased range as progressed.  Pt required max manual facilitation with rotation bilaterally at the start with the rotation towards the left side progressing to modA 2x25. [place] reaches with RUE with various angles, distances, and heights to target trunk rotation while promoting opening up of the chest and increased arm ROM 2x15. Rest break in between activities secondary to fatigue.   Pt sitting EOB with wife for dinner. Bed alarm on and call bell within reach. No further needs expressed at this time.   Therapy Documentation Precautions:  Precautions Precautions: Fall Precaution Comments: L neck incision, hx of L hemiparesis Required Braces or Orthoses: Other Brace Other Brace: AFO LLE Restrictions Weight Bearing Restrictions: No  Pain: Pain Assessment Pain Scale: 0-10 Pain Score: 0-No  pain   Therapy/Group: Individual Therapy  Robert [name], SPT 05/21/2021, 10:26 AM

## 2021-05-21 NOTE — Progress Notes (Signed)
Occupational Therapy Session Note  Patient Details  Name: Robert [name] MRN: [id number] Date of Birth: Feb 25, 1946  Today's Date: 05/21/2021 OT Individual Time: 1115-1209 OT Individual Time Calculation (min): 54 min    Short Term Goals: Week 1:  OT Short Term Goal 1 (Week 1): Patient will don pull over shirt with mod assist OT Short Term Goal 2 (Week 1): Patient will don shorts with mod assist OT Short Term Goal 3 (Week 1): Patient will complete grroming seated at sink with min assist OT Short Term Goal 4 (Week 1): Patient will use RUE to wash chest, thighs, LUE with min assist  Skilled Therapeutic Interventions/Progress Updates:    Pt received semi-reclined in bed with wife present, agreeable to therapy, bbut endorses fatigue. Session focus on self-care retraining, activity tolerance, anterior WS + trunk flexion, func transfers in prep for improved ADL/func mobility performance + decreased caregiver burden. Came to EOB with elevated HOB + increased time + min A to swivel hips. SB transfer > w/c on his R with min A, total A to place/remove board. Encouraged pt to count out loud " 1 2 3" prior to all transfers with improved initiation. Total A w/c transfer to and from gym. Completed 2x10 forward towel slides, forward trunk flexion with use of therapy ball. Finally, completed 1x10 R overhead stretches, horizontal shoulder abduction, forward and cross body reaches. In room, stedy transfer back to bed 2/2 energy conservation and time management. Doffed B shoes/L AFO total A, returned to supine mod A to progress BLE onto bed.    Pt left semi-reclined in bed with wife present with bed alarm engaged, call bell in reach, and all immediate needs met.    Therapy Documentation Precautions:  Precautions Precautions: Fall Precaution Comments: L neck incision, hx of L hemiparesis Required Braces or Orthoses: Other Brace Other Brace: AFO LLE Restrictions Weight Bearing Restrictions: No  Pain:    denies ADL: See Care Tool for more details.  Therapy/Group: Individual Therapy  Volanda Napoleon MS, OTR/L  05/21/2021, 6:48 AM

## 2021-05-21 NOTE — Patient Care Conference (Signed)
Inpatient RehabilitationTeam Conference and Plan of Care Update Date: 05/21/2021   Time: 10:01 AM    Patient Name: Robert [name]      Medical Record Number: [id number]  Date of Birth: 1946/08/31 Sex: Male         Room/Bed: 5C07C/5C07C-01 Payor Info: Payor: MEDICARE / Plan: MEDICARE PART A AND B / Product Type: *No Product type* /    Admit Date/Time:  05/15/2021  4:33 PM  Primary Diagnosis:  Embolic stroke [facility])  Hospital Problems: Principal Problem:   Embolic stroke ([place]) Active Problems:   H/O: CVA (cerebrovascular accident)   Gait abnormality   Hemiparesis affecting left side as late effect of cerebrovascular accident ([place])   Spasticity as late effect of cerebrovascular accident (CVA)    Expected Discharge Date: Expected Discharge Date: 05/28/21  Team Members Present: Physician leading conference: Dr. Alysia Penna Social Worker Present: Erlene Quan, BSW Nurse Present: Dorien Chihuahua, RN PT Present: Barrie Folk, PT OT Present: Providence Lanius, OT SLP Present: Sherren Kerns, SLP PPS Coordinator present : Gunnar Fusi, SLP     Current Status/Progress Goal Weekly Team Focus  Bowel/Bladder   Pt is continent of bowel but has a foley in for urinary retention  Pt will remain continent of bowel and will beging to urinate without foley  Will assess qshift and PRN   Swallow/Nutrition/ Hydration   dys 2 textures and thin liquids, intermittent supervision  Supervision  swallow startegies and dys 3 texture trials   ADL's   mod to max UB ADL, max to total A LBD at bed level or STS, currently transfer to toilet with stedy, wife currently performing most ADL at bed level  min A grooming, mod UBD/LBD,bathing, max toileting/toilet transfers; based off of functional baseline  self-care/functional transfer/balance retraining, BUE NMR, family/pt education, func cognition, DME/AE education   Mobility   Min/ Mod A for bed mobility; Mod A for squat pivot and Max A for SPVT  transfers; Max A for ambulation with HW  Bed mobility to Min A; Functional transfers to supervision level; Car transfer with Min A; Ambulation at [place] A to 25' with HW  NMR for overall coordination and balance, increased strengthening and activity tolerance, improved level of assist and reduced assist from wife, gait training and w/c training initiated   Communication   min-to-mod A word finding in conversation  Sup A  word finding in structures and unstructured tasks   Safety/Cognition/ Behavioral Observations  mod A - delayed processing, can be impacted by fatigue  Min A  basic problem solving, recall, sustained attention   Pain   Pt is currently pain free  Pt will remain pain free  Will assess qshift and PRN   Skin   Pt's buttocks have skin breakdown  Pt's buttocks will heal  Will assess qshift and PRN     Discharge Planning:  D/c home with spouse 24/7   Team Discussion: Chronic left hemi spasticity with Parkinson's now presenting with deconditioning post new mild stroke and new speech issues. BP labile per MD; able to take IV out today. Patient with slow processing and motor planning issues; requires extra time for processing, and communication. Patient on target to meet rehab goals: Currently mod - max assist for upper body care and max - total for lower body care. Able to complete sit - stand with min - mod assist and transfers with a stedy ( wife has one at home). Wife also completes bathing; total assist at a bed level. Goals for  discharge set at min assist with max assist for toileting  *See Care Plan and progress notes for long and short-term goals.   Revisions to Treatment Plan:  Currently on D2 thin diet with oral motor coordination issues with D3 trials Working on problem solving and communication  Teaching Needs: Safety, transfers, toileting,  Current Barriers to Discharge: Decreased caregiver support and Home enviroment access/layout  Possible Resolutions to  Barriers: Ongoing family education with wife     Medical Summary Current Status: Chronic foley, cont bowel, left hemiparesis and chronic spasticity  Barriers to Discharge: Other (comments)  Barriers to Discharge Comments: Parkinsons wth bradykinesia Possible Resolutions to Barriers/Weekly Focus: Botox LUE , Cont family ed   Continued Need for Acute Rehabilitation Level of Care: The patient requires daily medical management by a physician with specialized training in physical medicine and rehabilitation for the following reasons: Direction of a multidisciplinary physical rehabilitation program to maximize functional independence : Yes Medical management of patient stability for increased activity during participation in an intensive rehabilitation regime.: Yes Analysis of laboratory values and/or radiology reports with any subsequent need for medication adjustment and/or medical intervention. : Yes   I attest that I was present, lead the team conference, and concur with the assessment and plan of the team.   Margarito Liner 05/21/2021, 4:33 PM

## 2021-05-21 NOTE — Progress Notes (Addendum)
Patient ID: Robert [name], male   DOB: [birth year], 75 y.o.   MRN: [id number] Team Conference Report to Patient/Family  Team Conference discussion was reviewed with the patient and caregiver, including goals, any changes in plan of care and target discharge date.  Patient and caregiver express understanding and are in agreement.  The patient has a target discharge date of 05/28/21.  Sw met with pt and spouse provided team conference updates. Patient asleep, spouse yelling out to patient to "get up" and reports patient has a wax build up and is unable to hear well. Spouse reports patient was previously receiving wound care from [facility] and inquire if will continue, sw will have to follow up. SW informed spouse to wait on recommendations and she will then be updated. Sw asked spouse about scheduling family education and she responded "she feel educated about everything dealing with her husband already". Sw informed spouse education is needed with ST to education her on patient deficits and how things will look in the future. Spouse agreed to scheduled education on Saturday, 7/30 1-4 PM. Spouse then reports she just feels overwhelmed, sw asked her to elaborate, she reports feelings overwhelmed about everything dealing with her spouse. Spouse questioning if patient will require ambulance transfer vs car transfer. SW will continue to follow up with additional questions and concerns.  Dyanne Iha 05/21/2021, 1:55 PM

## 2021-05-21 NOTE — Progress Notes (Signed)
Speech Language Pathology Daily Session Note  Patient Details  Name: Draedyn Foret MRN: [id number] Date of Birth: 1946-02-13  Today's Date: 05/21/2021 SLP Individual Time: 0800-0900 SLP Individual Time Calculation (min): 60 min  Short Term Goals: Week 1: SLP Short Term Goal 1 (Week 1): Patient will consume current diet without overt s/s of aspiration with min A verbal cues for use of swallowing compensatory strategies SLP Short Term Goal 2 (Week 1): Patient will utilize external memory aids to recall new, daily information with min A verbal/visual cues SLP Short Term Goal 3 (Week 1): Patient will demonstrate selective attention to functional tasks for 10 miunute intervals with min A verbal cues for redirection SLP Short Term Goal 4 (Week 1): Patient will demonstrate functional problem solving witn milldy complex tasks with Min A verbal cues SLP Short Term Goal 5 (Week 1): Patient will utilize communication/word finding strategies at conversational level to repair communication breakdowns with min A verbal cues.  Skilled Therapeutic Interventions: Patient agreeable to skilled ST intervention with focus on cognitive-linguistic goals. Assisted patient to bathroom using Steady with spouse who is trained on use. Patient required overall sup-to-min A verbal cues and additional processing time for following commands. Spouse and patient endorse tolerance of Dys 2 diet and thin liquids. Assessed tolerance of medications. Tolerated crushed in applesauce well with no overt s/sx of aspiration and adequate oral clearance. He exhibited difficulty with AP transit with single small whole pill that was unable to be crushed. He eventually cleared with additional liquid rinses. He continues to exhibit delayed swallow initiation and bolus holding with thin liquids, however this is baseline. Facilitated working memory and Facilities manager task with min-to-mod A semantic cues and repetition. Able to recall list of 10  items on functional grocery list with mod A semantic/category cues after 5 minute delay. Educated spouse on cognitive-communication strategies including additional processing time and use of description strategy for intermittent word finding difficulty. Patient was passed off to PT at end of session. Spouse at bedside. Continue per current plan of care.      Pain Pain Assessment Pain Scale: 0-10 Pain Score: 0-No pain  Therapy/Group: Individual Therapy  Patty Sermons 05/21/2021, 8:42 AM

## 2021-05-21 NOTE — Progress Notes (Signed)
PROGRESS NOTE   Subjective/Complaints: Discussed bloodwork with wife  ROS: limited due to language/communication     Objective:   No results found. Recent Labs    05/19/21 0710  WBC 7.4  HGB 14.4  HCT 44.9  PLT 313    Recent Labs    05/19/21 0710  NA 134*  K 4.1  CL 99  CO2 27  GLUCOSE 150*  BUN 13  CREATININE 0.98  CALCIUM 9.2     Intake/Output Summary (Last 24 hours) at 05/21/2021 0849 Last data filed at 05/21/2021 0300 Gross per 24 hour  Intake 240 ml  Output 1650 ml  Net -1410 ml      Pressure Injury 01/05/21 Right Stage 2 -  Partial thickness loss of dermis presenting as a shallow open injury with a red, pink wound bed without slough. (Active)  01/05/21 0500  Location:   Location Orientation: Right  Staging: Stage 2 -  Partial thickness loss of dermis presenting as a shallow open injury with a red, pink wound bed without slough.  Wound Description (Comments):   Present on Admission: Yes    Physical Exam: Vital Signs Blood pressure (!) 161/79, pulse 89, temperature 98.7 F (37.1 C), temperature source Oral, resp. rate 18, height '6\' 2"'$  (1.88 m), weight 102.9 kg, SpO2 98 %.    General: No acute distress Mood and affect are appropriate Heart: Regular rate and rhythm no rubs murmurs or extra sounds Lungs: Clear to auscultation, breathing unlabored, no rales or wheezes Abdomen: Positive bowel sounds, soft nontender to palpation, nondistended Extremities: No clubbing, cyanosis, or edema Skin: No evidence of breakdown, no evidence of rash   Skin: No evidence of breakdown, no evidence of rash Neurologic: HOH, a little slow to process, word finding deficits. Masked facies. Speech dysarthric. Cranial nerves II through XII intact, motor strength is 5/5 in right and 3-/5 left deltoid, bicep, tricep,  hip flexor, knee extensors, ankle dorsiflexor and plantar flexor Has good grasp but limited release  due to increased Left finger and wrist flexor tone    Assessment/Plan: 1. Functional deficits which require 3+ hours per day of interdisciplinary therapy in a comprehensive inpatient rehab setting. Physiatrist is providing close team supervision and 24 hour management of active medical problems listed below. Physiatrist and rehab team continue to assess barriers to discharge/monitor patient progress toward functional and medical goals  Care Tool:  Bathing              Bathing assist Assist Level: Total Assistance - Patient < 25% (Per OT eval)     Upper Body Dressing/Undressing Upper body dressing   What is the patient wearing?: Pull over shirt    Upper body assist Assist Level: Total Assistance - Patient < 25%    Lower Body Dressing/Undressing Lower body dressing      What is the patient wearing?: Pants     Lower body assist Assist for lower body dressing: Maximal Assistance - Patient 25 - 49%     Toileting Toileting    Toileting assist Assist for toileting: 2 Helpers     Transfers Chair/bed transfer  Transfers assist     Chair/bed transfer assist level: Maximal  Assistance - Patient 25 - 49%     Locomotion Ambulation   Ambulation assist      Assist level: Moderate Assistance - Patient 50 - 74% Assistive device: Orthosis (and wall rail) Max distance: 7'   Walk 10 feet activity   Assist  Walk 10 feet activity did not occur: Safety/medical concerns        Walk 50 feet activity   Assist Walk 50 feet with 2 turns activity did not occur: Safety/medical concerns         Walk 150 feet activity   Assist Walk 150 feet activity did not occur: Safety/medical concerns         Walk 10 feet on uneven surface  activity   Assist Walk 10 feet on uneven surfaces activity did not occur: Safety/medical concerns         Wheelchair     Assist Will patient use wheelchair at discharge?: No   Wheelchair activity did not occur: N/A          Wheelchair 50 feet with 2 turns activity    Assist    Wheelchair 50 feet with 2 turns activity did not occur: N/A       Wheelchair 150 feet activity     Assist  Wheelchair 150 feet activity did not occur: N/A       Blood pressure (!) 161/79, pulse 89, temperature 98.7 F (37.1 C), temperature source Oral, resp. rate 18, height '6\' 2"'$  (1.88 m), weight 102.9 kg, SpO2 98 %.  Medical Problem List and Plan: 1.  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  History of recurrent DVT/antithrombotics: -DVT/anticoagulation:  Pharmaceutical: Other (comment)--on Eliquis--will be on Eliquis lifelong after d/w Neuro and IM.              -antiplatelet therapy: DAPT 3. Pain Management: N/A 4. Mood: LCSW to follow for evaluation and support             -antipsychotic agents: On Pimavanserin daily 5. Neuropsych: This patient may not be fully capable of making decisions on his own behalf. 6. Skin/Wound Care: Treat/monitor MASD on sacrum and continue pressure relief measures.Also has 2 small skin tears- con't desitin              -- Monitor nutritional status and offer supplements as needed Po intake.   --added Juven twice daily to promote wound healing. 7. Fluids/Electrolytes/Nutrition: Monitor intake/output.  BUN/Creat nl will d/c IV 8.  T2DM: Resume metformin as > 48 hours since dye procedure. -- Will monitor blood sugars AC at bedtime.  Use SSI for elevated blood sugars as needed CBG (last 3)  Recent Labs    05/20/21 1621 05/20/21 2101 05/21/21 0536  GLUCAP 138* 140* 133*   Controlled 7/27  9.  Parkinson's disease: Tends to freeze per reports. On Azilect. Has slightly masked facies.  -- Continue Sinemet CR per home regimen--2.5 tabs BID during the day and one tap at  nights. 10.  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 Louis Meckel) --last changed 7/13 by [facility] 11.  History of CVA with left hemiplegia/dysphagia: Continue DAPT and statin. 12.  HTN: Monitor blood pressures 3 times daily with  permissive hypertension systolic blood pressure around 170. Vitals:   05/20/21 2000 05/21/21 0539  BP: 126/73 (!) 161/79  Pulse: 67 89  Resp: 16 18  Temp: 98 F (36.7 C) 98.7 F (37.1 C)  SpO2: 97% 98%  Lability 7/27 will monitor on current meds   13. Parkinson's Dementia?: Continue Namenda and Pimavanderin (Nuplazid) for mood stabilization.  14. Impacted cerumen in ears- start Debrox drops x4 days both ears 15. Spasticity- has never been treated  with injections- might benefit from Botox of LUE.Discussed with pt, would consider this     LOS: 6 days A FACE TO FACE EVALUATION WAS PERFORMED  Charlett Blake 05/21/2021, 8:49 AM

## 2021-05-22 LAB — GLUCOSE, CAPILLARY
Glucose-Capillary: 120 mg/dL — ABNORMAL HIGH (ref 70–99)
Glucose-Capillary: 164 mg/dL — ABNORMAL HIGH (ref 70–99)
Glucose-Capillary: 145 mg/dL — ABNORMAL HIGH (ref 70–99)
Glucose-Capillary: 164 mg/dL — ABNORMAL HIGH (ref 70–99)

## 2021-05-22 NOTE — Progress Notes (Signed)
Occupational Therapy Session Note  Patient Details  Name: Robert [name] MRN: [id number] Date of Birth: 1946-04-05  Today's Date: 05/22/2021 OT Individual Time: 1005-1058 OT Individual Time Calculation (min): 53 min    Short Term Goals: Week 1:  OT Short Term Goal 1 (Week 1): Patient will don pull over shirt with mod assist OT Short Term Goal 2 (Week 1): Patient will don shorts with mod assist OT Short Term Goal 3 (Week 1): Patient will complete grroming seated at sink with min assist OT Short Term Goal 4 (Week 1): Patient will use RUE to wash chest, thighs, LUE with min assist  Skilled Therapeutic Interventions/Progress Updates:    Pt received seated in w/c with wife/RN present, pass off from PT session, agreeable to therapy. Session focus on self-care retraining, activity tolerance, trunk control, dynamic sitting balance, anterior WS in prep for improved ADL/func mobility performance + decreased caregiver burden. No c/o pain, endorses fatigue post PT. RN present for med pass. Discussed with wife recs for HHOT and addition of LUE lap tray for home transport chair, wife in agreement. Total A w/c transfer to and from gym 2/2 time management and energy conservation. W/c <> mat table via SB with mod A to lift and pivot hips and for BLE placement. Pt does better going towards his R side, attempted L, but pt unable to clear hips enough without use of RUE on arm chair to safely complete. Completd 5 STS from elevated mat table with mod A to power up and to facilitate upright posture with use of HW. Difficulty self-correcting posture with tactile and verbal cues. Practiced cross body and ipsalateral/anterior reaching + trunk rotation to match cards while seated. No LOB, but demonstrates difficulty twisting trunk to reach cards outside of his BOS. In room, NT present for linens change and pt left in w/c with wife/NT present. Wife cleared for transfers. All immediate needs met.    Therapy  Documentation Precautions:  Precautions Precautions: Fall Precaution Comments: L neck incision, hx of L hemiparesis Required Braces or Orthoses: Other Brace Other Brace: AFO LLE Restrictions Weight Bearing Restrictions: No  Pain:  denies ADL: See Care Tool for more details.  Therapy/Group: Individual Therapy  Volanda Napoleon MS, OTR/L  05/22/2021, 6:40 AM

## 2021-05-22 NOTE — Progress Notes (Signed)
Physical Therapy Session Note  Patient Details  Name: Robert [name] MRN: [id number] Date of Birth: 1946-06-27  Today's Date: 05/22/2021 PT Individual Time: 0901-1004 and 1132-1200 PT Individual Time Calculation (min): 63 min and 28 min  Short Term Goals: Week 1:  PT Short Term Goal 1 (Week 1): STG=LTG due to ELOS  Skilled Therapeutic Interventions/Progress Updates:  Patient seated on EOB on entrance to room. Wife present. Patient alert and agreeable to PT session. Patient denied pain throughout session.  Therapeutic Activity: Transfers: Patient performed squat pivot to R requiring Min A  with extensive vc for weight shifting and sequencing oc pivot stepping prior to descending to sit. Min/ Mod A to position into seat on descent. STS throughout session using hallway handrail with Min A for power up and with push from w/c armrest with Mod A for power up and forward lean. Provided vc/tc for forward lean and overall technique throughout session.  Gait Training:  Patient ambulated 25' x1 using R handrail in hallway for entire distance with CGA/ Min A and wife available for +2 w/c follow. Demonstrated decreased step height/ length and flexed posture requiring vc/ tc for increasing L>R step height leading with knee flexion. Second bout using HW with good technique and similar quality of gait with CGA/ Min A for balance. Reaches 16' x1 with vc throughout for increasing L knee flexion during swing through.   Pt returned to room as 9:00am Parkinson's meds were not received on time. RN notified and meds provided. Wife relates bleeding from toe on L foot during dressing the previous day and wondering if bleeding could be from AFO. Shoe and sock doffed. 4th toe with medial aspect of nail with healing area. Does not appear to be from 3rd toe and not from area related directly to AFO/ shoe. Pt with no indication of pain at toe and reduced sensation. Wife to keep watch of toe for any signs of non-healing. Wife  with questions re: old t-brace AFO and velcro. Possible need for Hangar assessment of AFO.   Patient seated upright  in w/c at end of session with brakes locked, and all needs within reach. OT arriving to start therapy session.  Session 2: Patient seated upright in w/c on entrance to room. Patient alert and agreeable to PT session. Patient denied pain during session.  Neuromuscular Re-ed: NMR facilitated during session with focus on STS performance/ motor control and standing balance. Pt guided in STS x5 reps with rest between. Initial rise to stand required Mod A for power up, significant preparation prior to effort, and pause during rise for repositioning of R foot into wider BOS to complete to full upright posture. Work with pt on positioning feet into wider BOS closer to wheels of w/c. Subsequent STS with improved time to reach standing and after 2 trials pushing from w/c and 2 trials with hand on HW, pt demos improved technique with hand on HW to begin rise to stand.  NMR performed for improvements in motor control and coordination, balance, sequencing, judgement, and self confidence/ efficacy in performing all aspects of mobility at highest level of independence.   Patient seated  in w/c at end of session with brakes locked, belt alarm set, and all needs within reach. Lunch to arrive soon.      Therapy Documentation Precautions:  Precautions Precautions: Fall Precaution Comments: L neck incision, hx of L hemiparesis Required Braces or Orthoses: Other Brace Other Brace: AFO LLE Restrictions Weight Bearing Restrictions: No  Therapy/Group: Individual  Therapy  [name] PT, DPT 05/22/2021, 6:55 PM

## 2021-05-22 NOTE — Progress Notes (Signed)
PROGRESS NOTE   Subjective/Complaints: No issues overnite  ROS: limited due to language/communication     Objective:   No results found. Recent Labs    05/19/21 0710  WBC 7.4  HGB 14.4  HCT 44.9  PLT 313    Recent Labs    05/19/21 0710  NA 134*  K 4.1  CL 99  CO2 27  GLUCOSE 150*  BUN 13  CREATININE 0.98  CALCIUM 9.2     Intake/Output Summary (Last 24 hours) at 05/22/2021 0644 Last data filed at 05/22/2021 0500 Gross per 24 hour  Intake 480 ml  Output 1300 ml  Net -820 ml          Physical Exam: Vital Signs Blood pressure 132/90, pulse 90, temperature 97.7 F (36.5 C), temperature source Oral, resp. rate 16, height '6\' 2"'$  (1.88 m), weight 102.9 kg, SpO2 98 %.  General: No acute distress Mood and affect are appropriate Heart: Regular rate and rhythm no rubs murmurs or extra sounds Lungs: Clear to auscultation, breathing unlabored, no rales or wheezes Abdomen: Positive bowel sounds, soft nontender to palpation, nondistended Extremities: No clubbing, cyanosis, or edema   Skin: No evidence of breakdown, no evidence of rash Neurologic: HOH, a little slow to process, word finding deficits. Masked facies. Speech dysarthric. Cranial nerves II through XII intact, motor strength is 5/5 in right and 3-/5 left deltoid, bicep, tricep,  hip flexor, knee extensors, ankle dorsiflexor and plantar flexor TONE  LUE  Pec MAS3 Bicep MAS 3  Wrist flex MAS 3  Finger flex MAS 2  Has good grasp but limited release due to increased Left finger and wrist flexor tone    Assessment/Plan: 1. Functional deficits which require 3+ hours per day of interdisciplinary therapy in a comprehensive inpatient rehab setting. Physiatrist is providing close team supervision and 24 hour management of active medical problems listed below. Physiatrist and rehab team continue to assess barriers to discharge/monitor patient progress  toward functional and medical goals  Care Tool:  Bathing              Bathing assist Assist Level: Total Assistance - Patient < 25%     Upper Body Dressing/Undressing Upper body dressing   What is the patient wearing?: Pull over shirt    Upper body assist Assist Level: Total Assistance - Patient < 25%    Lower Body Dressing/Undressing Lower body dressing      What is the patient wearing?: Pants     Lower body assist Assist for lower body dressing: Maximal Assistance - Patient 25 - 49%     Toileting Toileting    Toileting assist Assist for toileting: 2 Helpers     Transfers Chair/bed transfer  Transfers assist     Chair/bed transfer assist level: Maximal Assistance - Patient 25 - 49%     Locomotion Ambulation   Ambulation assist      Assist level: Moderate Assistance - Patient 50 - 74% Assistive device: Orthosis (and wall rail) Max distance: 7'   Walk 10 feet activity   Assist  Walk 10 feet activity did not occur: Safety/medical concerns        Walk 50 feet  activity   Assist Walk 50 feet with 2 turns activity did not occur: Safety/medical concerns         Walk 150 feet activity   Assist Walk 150 feet activity did not occur: Safety/medical concerns         Walk 10 feet on uneven surface  activity   Assist Walk 10 feet on uneven surfaces activity did not occur: Safety/medical concerns         Wheelchair     Assist Will patient use wheelchair at discharge?: No   Wheelchair activity did not occur: N/A         Wheelchair 50 feet with 2 turns activity    Assist    Wheelchair 50 feet with 2 turns activity did not occur: N/A       Wheelchair 150 feet activity     Assist  Wheelchair 150 feet activity did not occur: N/A       Blood pressure 132/90, pulse 90, temperature 97.7 F (36.5 C), temperature source Oral, resp. rate 16, height '6\' 2"'$  (1.88 m), weight 102.9 kg, SpO2 98 %.  Medical Problem  List and Plan: 1.  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  History of recurrent DVT/antithrombotics: -DVT/anticoagulation:  Pharmaceutical: Other (comment)--on Eliquis--will be on Eliquis lifelong after d/w Neuro and IM.              -antiplatelet therapy: DAPT 3. Pain Management: N/A 4. Mood: LCSW to follow for evaluation and support             -antipsychotic agents: On Pimavanserin daily 5. Neuropsych: This patient may not be fully capable of making decisions on his own behalf. 6. Skin/Wound Care: Treat/monitor MASD on sacrum and continue pressure relief measures.Also has 2 small skin tears- con't desitin              -- Monitor nutritional status and offer supplements as needed Po intake.   --added Juven twice daily to promote wound healing. 7. Fluids/Electrolytes/Nutrition: Monitor intake/output.  BUN/Creat nl will d/c IV 8.  T2DM: Resume metformin as > 48 hours since dye procedure. -- Will monitor blood sugars AC at bedtime.  Use SSI for elevated blood sugars as needed CBG (last 3)  Recent Labs    05/21/21 1634 05/21/21 2130 05/22/21 0629  GLUCAP 147* 163* 120*   Controlled 7/28  9.  Parkinson's disease: Tends to freeze per reports. On Azilect. Has slightly masked facies.  -- Continue Sinemet CR per home regimen--2.5 tabs BID during the day and one tap at nights. 10.  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 Louis Meckel) --last changed 7/13 by [facility]. 11.  History of CVA with left hemiplegia/dysphagia: Continue DAPT and statin. 12.  HTN: Monitor blood pressures 3 times daily with permissive hypertension systolic blood pressure around 170. Vitals:   05/21/21 1900 05/22/21 0500  BP: 140/80 132/90  Pulse: 73 90  Resp: 16 16  Temp: 98.5 F (36.9 C) 97.7 F (36.5  C)  SpO2: 98% 98%  Lability 7/27 will monitor on current meds   13. Parkinson's Dementia?: Continue Namenda and Pimavanderin (Nuplazid) for mood stabilization.  14. Impacted cerumen in ears- start Debrox  drops x4 days both ears 15. Spasticity- has never been treated  with injections- might benefit from Botox of LUE.Discussed with pt and wife  would like to proceed, will need 200-300U , will check ofice for samples     LOS: 7 days A FACE TO FACE EVALUATION WAS PERFORMED  Robert [name] 05/22/2021, 6:44 AM

## 2021-05-22 NOTE — Progress Notes (Signed)
Speech Language Pathology Daily Session Note  Patient Details  Name: Robert [name] MRN: [id number] Date of Birth: February 05, 1946  Today's Date: 05/22/2021 SLP Individual Time: 1300-1345 SLP Individual Time Calculation (min): 45 min  Short Term Goals: Week 1: SLP Short Term Goal 1 (Week 1): Patient will consume current diet without overt s/s of aspiration with min A verbal cues for use of swallowing compensatory strategies SLP Short Term Goal 2 (Week 1): Patient will utilize external memory aids to recall new, daily information with min A verbal/visual cues SLP Short Term Goal 3 (Week 1): Patient will demonstrate selective attention to functional tasks for 10 miunute intervals with min A verbal cues for redirection SLP Short Term Goal 4 (Week 1): Patient will demonstrate functional problem solving witn milldy complex tasks with Min A verbal cues SLP Short Term Goal 5 (Week 1): Patient will utilize communication/word finding strategies at conversational level to repair communication breakdowns with min A verbal cues.  Skilled Therapeutic Interventions: Patient agreeable to skilled ST intervention with focus on cognitive-linguistic goals. Patient was accompanied by spouse. SLP facilitated mildly complex verbal reasoning/problem solving, recall, and sustained attention skills with novel card game "Blink" with mod-to-max A for initiation, recall of task instructions, reasoning/problem solving. Patient benefited from extended processing time for improved accuracy and demonstrated improvement with referring to memory aid to help him recall the components of the game. Patient sustained attention for approximately 15 minutes with min A verbal cues for re-direction, and required mod A verbal re-direction during remainder of session due to apparent cognitive fatigue and decreased processing. Patient was left in chair with alarm activated and immediate needs within reach at end of session. Continue per current plan  of care.      Pain Pain Assessment Pain Scale: 0-10 Pain Score: 0-No pain  Therapy/Group: Individual Therapy  [name] [name] 05/22/2021, 1:50 PM

## 2021-05-23 DIAGNOSIS — G8110 Spastic hemiplegia affecting unspecified side: Secondary | ICD-10-CM

## 2021-05-23 DIAGNOSIS — G8111 Spastic hemiplegia affecting right dominant side: Secondary | ICD-10-CM

## 2021-05-23 LAB — GLUCOSE, CAPILLARY
Glucose-Capillary: 137 mg/dL — ABNORMAL HIGH (ref 70–99)
Glucose-Capillary: 131 mg/dL — ABNORMAL HIGH (ref 70–99)
Glucose-Capillary: 132 mg/dL — ABNORMAL HIGH (ref 70–99)
Glucose-Capillary: 131 mg/dL — ABNORMAL HIGH (ref 70–99)

## 2021-05-23 MED ORDER — CARBIDOPA-LEVODOPA ER 25-100 MG PO TBCR
2.5000 | EXTENDED_RELEASE_TABLET | Freq: Two times a day (BID) | ORAL | Status: DC
  Administered 2021-05-24 – 2021-05-28 (×11): 2.5 via ORAL
  Filled 2021-05-23 (×11): qty 2.5

## 2021-05-23 NOTE — Progress Notes (Addendum)
Speech Language Pathology Weekly Progress and Session Note  Patient Details  Name: Robert [name] MRN: [id number] Date of Birth: 06-Oct-1946  Beginning of progress report period: May 16, 2021 End of progress report period: May 23, 2021  Today's Date: 05/23/2021 SLP Individual Time: 45 minutes   Short Term Goals: Week 1: SLP Short Term Goal 1 (Week 1): Patient will consume current diet without overt s/s of aspiration with min A verbal cues for use of swallowing compensatory strategies SLP Short Term Goal 1 - Progress (Week 1): Met SLP Short Term Goal 2 (Week 1): Patient will utilize external memory aids to recall new, daily information with min A verbal/visual cues SLP Short Term Goal 2 - Progress (Week 1): Progressing toward goal SLP Short Term Goal 3 (Week 1): Patient will demonstrate selective attention to functional tasks for 10 miunute intervals with min A verbal cues for redirection SLP Short Term Goal 3 - Progress (Week 1): Met SLP Short Term Goal 4 (Week 1): Patient will demonstrate functional problem solving witn milldy complex tasks with Min A verbal cues SLP Short Term Goal 4 - Progress (Week 1): Progressing toward goal SLP Short Term Goal 5 (Week 1): Patient will utilize communication/word finding strategies at conversational level to repair communication breakdowns with min A verbal cues. SLP Short Term Goal 5 - Progress (Week 1): Met  New Short Term Goals: Week 2: SLP Short Term Goal 1 (Week 2): STG=LTG due to ELOS  Weekly Progress Updates: Patient has met 3 of 5 short term goals. Patient has made functional progress with ST over the past week due to improved attention, ability to use word finding strategies, diet tolerance of Dys 2 diet and thin liquids, and is progressing with use of compensatory memory strategies. Patient's spouse reports that patient is near his baseline for cognition in light of Parkinson's and baseline cognitive deficits, but patient reports that he feels  it takes him more time to process information which is apparent. Patient currently requires mod-to-max A for problem solving, min A verbal redirection for attention after approximately 15 minute intervals when not drowsy, and mod A to refer to compensatory memory aids. Patient currently exhibits occasional word finding difficulty which may be more attributed to impaired information processing requiring extended processing time vs. language impairment. Oral dysphagia has improved and patient has been advanced to Dys 3 diet and thin liquids with no overt s/sx of aspiration. Currently taking pills crushed in puree. Patient's spouse has been present during treatment sessions and receptive to education on dysphagia, and cognitive-communication. Recommend continued skilled ST intervention to maximize cognitive-communication and swallow function and functional independence prior to discharge. Long-term goals were adjusted secondary to further conversation with spouse regarding patient's baseline cognitive deficits.  Goals set at overall sup A for dysphagia and swallow safety, sup A for use of communication strategies, and mod A for memory and problem solving.    Intensity: Minumum of 1-2 x/day, 30 to 90 minutes Frequency: 3 to 5 out of 7 days Duration/Length of Stay: 8/3 Treatment/Interventions: Cognitive remediation/compensation;Environmental controls;Internal/external aids;Speech/Language facilitation;Cueing hierarchy;Dysphagia/aspiration precaution training;Patient/family education  Daily Session  Skilled Therapeutic Interventions: Patient agreeable to skilled ST intervention with focus on cognitive goals. Patient was oriented to all concepts without cues and reports he refers to dry erase board in his room in which his spouse facilitates on a daily basis. This is something spouse promoted at prior level. Analyzed tolerance of Dys 3 PO trials: patient tolerated well with mildly prolonged yet effective  mastication, adequate oral coordination and bolus manipulation, and trace oral residue which were effectively cleared with additional swallow. Patient exhibited no overt s/sx of aspiration. Recommend diet advancement to Dys 3 and thin liquids. Continue med crushed in puree. Both patient and spouse verbalized agreement with plan. Facilitated novel card game "UNO" with max A verbal/visual cues for recall of instructions, information processing, and reasoning. Patient exhibited reduced carry over throughout task and consistent repetition to generalize skill. Patient was left in bed with alarm activated and immediate needs within reach at end of session. Spouse at bedside. Continue per current plan of care.       General    Pain: 0   Therapy/Group: Individual Therapy  Patty Sermons 05/23/2021, 4:57 PM

## 2021-05-23 NOTE — Progress Notes (Signed)
PROGRESS NOTE   Subjective/Complaints: Discussed Botulinum toxin for spasticity with pt and wife, risk , benefit , onset and duration of action  No new issues overnite  ROS: limited due to language/communication     Objective:   No results found. No results for input(s): WBC, HGB, HCT, PLT in the last 72 hours.  No results for input(s): NA, K, CL, CO2, GLUCOSE, BUN, CREATININE, CALCIUM in the last 72 hours.   Intake/Output Summary (Last 24 hours) at 05/23/2021 0730 Last data filed at 05/23/2021 0500 Gross per 24 hour  Intake 240 ml  Output 825 ml  Net -585 ml          Physical Exam: Vital Signs Blood pressure 129/74, pulse 76, temperature 98.1 F (36.7 C), resp. rate 14, height '6\' 2"'$  (1.88 m), weight 102.9 kg, SpO2 96 %.  General: No acute distress Mood and affect are appropriate Heart: Regular rate and rhythm no rubs murmurs or extra sounds Lungs: Clear to auscultation, breathing unlabored, no rales or wheezes Abdomen: Positive bowel sounds, soft nontender to palpation, nondistended Extremities: No clubbing, cyanosis, or edema  Skin: No evidence of breakdown, no evidence of rash Neurologic: HOH, a little slow to process, word finding deficits. Masked facies. Speech dysarthric. Cranial nerves II through XII intact, motor strength is 5/5 in right and 3-/5 left deltoid, bicep, tricep,  hip flexor, knee extensors, ankle dorsiflexor and plantar flexor TONE  LUE  Pec MAS3 Bicep MAS 3  Wrist flex MAS 3  Finger flex MAS 3  Has good grasp but limited release due to increased Left finger and wrist flexor tone    Assessment/Plan: 1. Functional deficits which require 3+ hours per day of interdisciplinary therapy in a comprehensive inpatient rehab setting. Physiatrist is providing close team supervision and 24 hour management of active medical problems listed below. Physiatrist and rehab team continue to assess  barriers to discharge/monitor patient progress toward functional and medical goals  Care Tool:  Bathing              Bathing assist Assist Level: Total Assistance - Patient < 25%     Upper Body Dressing/Undressing Upper body dressing   What is the patient wearing?: Pull over shirt    Upper body assist Assist Level: Total Assistance - Patient < 25%    Lower Body Dressing/Undressing Lower body dressing      What is the patient wearing?: Pants     Lower body assist Assist for lower body dressing: Maximal Assistance - Patient 25 - 49%     Toileting Toileting    Toileting assist Assist for toileting: 2 Helpers     Transfers Chair/bed transfer  Transfers assist     Chair/bed transfer assist level: Maximal Assistance - Patient 25 - 49%     Locomotion Ambulation   Ambulation assist      Assist level: Moderate Assistance - Patient 50 - 74% Assistive device: Orthosis (and wall rail) Max distance: 7'   Walk 10 feet activity   Assist  Walk 10 feet activity did not occur: Safety/medical concerns        Walk 50 feet activity   Assist Walk 50 feet with  2 turns activity did not occur: Safety/medical concerns         Walk 150 feet activity   Assist Walk 150 feet activity did not occur: Safety/medical concerns         Walk 10 feet on uneven surface  activity   Assist Walk 10 feet on uneven surfaces activity did not occur: Safety/medical concerns         Wheelchair     Assist Will patient use wheelchair at discharge?: No   Wheelchair activity did not occur: N/A         Wheelchair 50 feet with 2 turns activity    Assist    Wheelchair 50 feet with 2 turns activity did not occur: N/A       Wheelchair 150 feet activity     Assist  Wheelchair 150 feet activity did not occur: N/A       Blood pressure 129/74, pulse 76, temperature 98.1 F (36.7 C), resp. rate 14, height '6\' 2"'$  (1.88 m), weight 102.9 kg, SpO2 96  %.  Medical Problem List and Plan: 1.  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  History of recurrent DVT/antithrombotics: -DVT/anticoagulation:  Pharmaceutical: Other (comment)--on Eliquis--will be on Eliquis lifelong after d/w Neuro and IM.              -antiplatelet therapy: DAPT 3. Pain Management: N/A 4. Mood: LCSW to follow for evaluation and support             -antipsychotic agents: On Pimavanserin daily 5. Neuropsych: This patient may not be fully capable of making decisions on his own behalf. 6. Skin/Wound Care: Treat/monitor MASD on sacrum and continue pressure relief measures.Also has 2 small skin tears- con't desitin              -- Monitor nutritional status and offer supplements as needed Po intake.   --added Juven twice daily to promote wound healing. 7. Fluids/Electrolytes/Nutrition: Monitor intake/output.  BUN/Creat nl will d/c IV 8.  T2DM: Resume metformin as > 48 hours since dye procedure. -- Will monitor blood sugars AC at bedtime.  Use SSI for elevated blood sugars as needed CBG (last 3)  Recent Labs    05/22/21 1620 05/22/21 2142 05/23/21 0614  GLUCAP 145* 164* 137*   Controlled 7/29  9.  Parkinson's disease: Tends to freeze per reports. On Azilect. Has slightly masked facies.  -- Continue Sinemet CR per home regimen--2.5 tabs BID during the day and one tap at nights. 10.  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 Louis Meckel) --last changed 7/13 by [facility]. 11.  History of CVA with left hemiplegia/dysphagia: Continue DAPT and statin. 12.  HTN: Monitor blood pressures 3 times daily with permissive hypertension systolic blood pressure around 170. Vitals:   05/22/21 1935 05/23/21 0500  BP: 116/68 129/74  Pulse: 90 76  Resp: 14 14  Temp: 98.7 F  (37.1 C) 98.1 F (36.7 C)  SpO2: 99% 96%  Controlled  7/29 will monitor on current meds   13. Parkinson's Dementia?: Continue Namenda and Pimavanderin (Nuplazid) for mood stabilization.  14. Impacted cerumen in ears- start Debrox drops x4 days both ears 15. Spasticity- has never been  treated  with injections- might benefit from Botox of LUE.Discussed with pt and wife  would like to proceed, will inject the following  Left Pec 50U, Biceps 100U, FCR 50, FDS 50 , LOS: 8 days A FACE TO FACE EVALUATION WAS PERFORMED  Charlett Blake 05/23/2021, 7:30 AM

## 2021-05-23 NOTE — Progress Notes (Signed)
Physical Therapy Session Note  Patient Details  Name: Robert [name] MRN: [id number] Date of Birth: 06-Aug-1946  Today's Date: 05/23/2021 PT Individual Time: 3329-5188 PT Individual Time Calculation (min): 74 min   Short Term Goals: Week 1:  PT Short Term Goal 1 (Week 1): STG=LTG due to ELOS  Skilled Therapeutic Interventions/Progress Updates:    Session One: Pt received via handoff from NT for dressing.  Pt agreeable to therapy session and reports no pain. Wife present for session. Supine>sitting EOB with supervision and increased time to complete task.  Pt complete sitting EOB with dynamic balance during upper and lower body dressing totalA for time conservation.   Sitting EOB 3x5 STS with stedy CGA. Reaching with RUE at various angles, heights, and distances with cross body and rotational aspects to engage core 5x1' intermixed with STS activity and rest breaks for fatigue management. Pt demonstrated improved ability to complete rotational movements at a faster speed from previous session with this SPT.    Pt transferred via stedy bed <>wc due to increased time/difficulty with turning and fatigue. STS completed with CGA-close supervision (stedy) and MinA (<>wc for gait) with increased time to complete task and manual facilitation to initiate. Pt benefits from external cue of him counting to 3.   Gait training in the hall 1x64ft with HW minA. 1x69ft with RUE support on hand rail minA. Increased time and difficulty this session with initiation of gait. Pt demonstrates decreased step length bilaterally (LLE>RLE), decreased bilateral weight shifting, and decreased gait speed. Consistent cuing for sequencing to encourage continuous movement and minimize freezing episodes. Attempted to switch to [facility] during second bout after hand rail in hall terminated with increased difficulty to initiate and unable to get proper placement of HW due to proximity to wall.    Pt returned to Supine in bed with HOB  elevated. ModA with assist of BLE while pt controlled descent of BUE and trunk. Therapist assist with repositioning shoulders. Bed alarm on, call bell within reach, and no further needs expressed at this time.   Session Two:  Pt received sitting in chair and agreeable to therapy. Pt reports no pain but is feeling tired. Wife present for session.   Trunk rotations L/R with BUE holding bat 2x 1 minute 15 seconds. Verbal cuing to maintain higher speed. Targets provided via SPT hand for external cue with increased range.  Pt required moderate manual facilitation of truncal rotation fading to minA towards the R only. 2x10 AAROM with bad shoulder flexion. MaxA manual facilitation fading to heavy MinA and increased ROM as exercise progressed.   Standing step out/back 1x4 and 1x2 BLE with purple and red dots for external cuing. Pt required increased time and verbal cuing towards external target to complete task. Pt demonstrated increased difficulty with backwards step LLE>RLE, min manual assistance to initiate backwards movement. STS during activity with MinA while allowing patient to have increased time for task. One instance of pt requiring two attempts. Pt benefits from external cuing of counting to 3.   Education provided on option for medical transport home for safety. Wife stated she would feel more comfortable with that option and will check to see cost. Pt educated on importance to stay active little activities every hour with rest breaks to continue progress. Pt sitting in wc, brakes locked, and wife present. Wife states they will at NT to assist when ready to return to bed.   Therapy Documentation Precautions:  Precautions Precautions: Fall Precaution Comments: L neck incision, hx  of L hemiparesis Required Braces or Orthoses: Other Brace Other Brace: AFO LLE Restrictions Weight Bearing Restrictions: No   Pain: Pain Assessment Pain Scale: 0-10 Pain Score: 0-No pain    Therapy/Group:  Individual Therapy  Jasenia Weilbacher, SPT 05/23/2021, 11:11 AM

## 2021-05-23 NOTE — Progress Notes (Signed)
Occupational Therapy Weekly Progress Note  Patient Details  Name: Robert [name] MRN: [id number] Date of Birth: 1946/05/26  Beginning of progress report period: May 16, 2021 End of progress report period: May 23, 2021  Today's Date: 05/23/2021 OT Individual Time: 1021-1173 OT Individual Time Calculation (min): 53 min    Patient has met 3 of 4 short term goals.  Pt has made moderate progress this week in OT. Pt has demonstrated improved activity tolerance, execution of counting to facilitate transfers, improved trunk control, and BLE strengthening to presently complete UB ADL at mod A, LBD ADL at max to total A, and SB transfers to his R with min to mod A.  Pt cont to be primarily limited by freezing episodes during stand-pivots 2/2 PD dx + baseline L hemiplegia, generalized deconditioning/weakness. Anticipate 24/7 S and max physical assist required upon DC. Wife prefers to complete ADL at baseline for pt, and is most concerned about ability to return to stand-pivots and safely enter/exit car.   Patient continues to demonstrate the following deficits: muscle weakness, decreased cardiorespiratoy endurance, impaired timing and sequencing, abnormal tone, unbalanced muscle activation, motor apraxia, ataxia, decreased coordination, and decreased motor planning, decreased attention to left, decreased initiation, decreased attention, decreased awareness, decreased problem solving, decreased safety awareness, decreased memory, and delayed processing, and decreased standing balance, decreased postural control, hemiplegia, and decreased balance strategies and therefore will continue to benefit from skilled OT intervention to enhance overall performance with BADL and Reduce care partner burden.  Patient progressing toward long term goals..  Continue plan of care.  OT Short Term Goals Week 2:  OT Short Term Goal 1 (Week 2): STG = LTG 2/2 ELOS  Skilled Therapeutic Interventions/Progress Updates:     Pt  received seated EOB with RN/wife present, no c/o pain, agreeable to therapy. Session focus on self-care retraining, activity tolerance, func transfers, family edu in prep for improved ADL/IADL/func mobility performance + decreased caregiver burden. Pt agreeable to simulate dressing tasks - donned large scrub shirt with overall mod A to thread LUE + head. Pt is able to doff with light min A to remove L hand. Donned pants with total A to thread BLE, but pt is able to assist by pulling up pants on RLE and completed STS from elevated surface with use of bed rail with min A. Multiple attempts to stand-pivot with use of bed rail > w/c, but pt demonstrating significant freezing. Pt is able to use counting "1 2 3" to assist in SP, but difficulty continuing to count to sequence pivoting. Wife with concerns of car transfers, preferring ambulance transport home and does foresee herself using SB, will discuss with PT. In gym, completed 3 min on/off of kinetron at 70 cm/sec. Pt with difficulty maintaining upright posture and anterior WS while completing tasks. Completed 3x10 B seated marches with max A to facilitate LLE.  Pt left seated in w/c with wife present awaiting following PT session. Wife cleared for transfers per safety plan.   Therapy Documentation Precautions:  Precautions Precautions: Fall Precaution Comments: L neck incision, hx of L hemiparesis Required Braces or Orthoses: Other Brace Other Brace: AFO LLE Restrictions Weight Bearing Restrictions: No  Pain:   No c/o ADL: See Care Tool for more details.   Therapy/Group: Individual Therapy  Volanda Napoleon MS, OTR/L  05/23/2021, 6:42 AM

## 2021-05-23 NOTE — Plan of Care (Signed)
  Problem: RH Problem Solving Goal: LTG Patient will demonstrate problem solving for (SLP) Description: LTG:  Patient will demonstrate problem solving for basic/complex daily situations with cues  (SLP) Flowsheets (Taken 05/23/2021 1647) LTG Patient will demonstrate problem solving for: Moderate Assistance - Patient 50 - 74% Note: Goal modified due to baseline cognitive deficits in light of Parkinson's and prior CVA.    Problem: RH Memory Goal: LTG Patient will use memory compensatory aids to (SLP) Description: LTG:  Patient will use memory compensatory aids to recall biographical/new, daily complex information with cues (SLP) Flowsheets (Taken 05/23/2021 1648) LTG: Patient will use memory compensatory aids to (SLP): Minimal Assistance - Patient > 75% Note: Goal adjusted due to baseline cognitive deficits in light of Parkinson's and prior CVA.

## 2021-05-23 NOTE — Procedures (Signed)
Xeomin Injection for spasticity using needle EMG guidance Xeomin Lot [id number], exp 09/2022, lot [id number], exp 07/2022 Dilution: 50 Units/ml Indication: Severe spasticity which interferes with ADL,mobility and/or  hygiene and is unresponsive to medication management and other conservative care Informed consent was obtained after describing risks and benefits of the procedure with the patient. This includes bleeding, bruising, infection, excessive weakness, or medication side effects. A REMS form is on file and signed. Needle: 25g 1.5 in needle  Number of units per muscle Pectoralis100 Biceps100 FCR50  All injections were done after obtaining appropriate EMG activity and after negative drawback for blood. The patient tolerated the procedure well. Post procedure instructions were given.

## 2021-05-24 LAB — GLUCOSE, CAPILLARY
Glucose-Capillary: 126 mg/dL — ABNORMAL HIGH (ref 70–99)
Glucose-Capillary: 129 mg/dL — ABNORMAL HIGH (ref 70–99)
Glucose-Capillary: 226 mg/dL — ABNORMAL HIGH (ref 70–99)
Glucose-Capillary: 158 mg/dL — ABNORMAL HIGH (ref 70–99)

## 2021-05-24 NOTE — Progress Notes (Signed)
Physical Therapy Session Note  Patient Details  Name: Robert [name] MRN: [id number] Date of Birth: 1945-11-14  Today's Date: 05/24/2021 PT Individual Time: 1303-1400 PT Individual Time Calculation (min): 57 min   Short Term Goals: Week 1:  PT Short Term Goal 1 (Week 1): STG=LTG due to ELOS  Skilled Therapeutic Interventions/Progress Updates:   Pt received supine in bed and agreeable to PT. PT dons pt's shoes while supine in bed for time. Supine>sit transfer with min assist and heavy use of bed rail.   Stand pivot transfer to [facility] with min assist and HW. Cues for [facility] placement, but noted to improved step length and sequencing of movement on this day.   Gait training with HW x 14f with min assist occasional assist with weight shifting to the R to offload the LLE prior to limb advancement, but noted to have significantly improving timing on this day. Occasional foot drag on the LLE. PT requsted AFO consult to add toecap the L AFP  Blocked practice stand pivot transfer training to  R and L with min assist overall and increased time due to increasing apraxia with fatigue. Min cues for AD management and step width on the LLE in both directions.    Bits visual scanning with RUE x 2 to force trunkal rotation R and L then performed x 1 with LUE with max hand over Hand assist to improve coordination and ROM; noted to initiate all movement through chest and tricep on the RLE this day for reaching   [facility] returned to [facility] and [facility] and all needs met.       Therapy Documentation Precautions:  Precautions Precautions: Fall Precaution Comments: L neck incision, hx of L hemiparesis Required Braces or Orthoses: Other Brace Other Brace: AFO LLE Restrictions Weight Bearing Restrictions: No  Pain: Pain Assessment Pain Score: 0-No pain     Therapy/Group: Individual Therapy  ALorie Phenix7/30/2022, 5:48 PM

## 2021-05-24 NOTE — Progress Notes (Cosign Needed)
Patient ate some food with lunch and now has bleeding to the gums between the 2 front teeth. Used mouthwash to cleanse the area. Diet changed down to D1 puree, thin liquids. Still small amount of sanguinous bleeding between front 2 teeth. MD aware. Angie Fava

## 2021-05-24 NOTE — Progress Notes (Signed)
PROGRESS NOTE   Subjective/Complaints: Sleepy Patient's chart reviewed- No issues reported overnight Vitals signs stable   ROS: Limited due to language/communication     Objective:   No results found. No results for input(s): WBC, HGB, HCT, PLT in the last 72 hours.  No results for input(s): NA, K, CL, CO2, GLUCOSE, BUN, CREATININE, CALCIUM in the last 72 hours.   Intake/Output Summary (Last 24 hours) at 05/24/2021 1207 Last data filed at 05/24/2021 0900 Gross per 24 hour  Intake 636 ml  Output 1800 ml  Net -1164 ml         Physical Exam: Vital Signs Blood pressure 137/77, pulse 79, temperature 98 F (36.7 C), temperature source Oral, resp. rate 17, height '6\' 2"'$  (1.88 m), weight 102.9 kg, SpO2 99 %. Gen: no distress, normal appearing HEENT: oral mucosa pink and moist, NCAT Cardio: Reg rate Chest: normal effort, normal rate of breathing Abd: soft, non-distended Ext: no edema Psych: pleasant, normal affect  Skin: No evidence of breakdown, no evidence of rash Neurologic: HOH, a little slow to process, word finding deficits. Masked facies. Speech dysarthric. Cranial nerves II through XII intact, motor strength is 5/5 in right and 3-/5 left deltoid, bicep, tricep,  hip flexor, knee extensors, ankle dorsiflexor and plantar flexor TONE  LUE  Pec MAS3 Bicep MAS 3  Wrist flex MAS 3  Finger flex MAS 3  Has good grasp but limited release due to increased Left finger and wrist flexor tone    Assessment/Plan: 1. Functional deficits which require 3+ hours per day of interdisciplinary therapy in a comprehensive inpatient rehab setting. Physiatrist is providing close team supervision and 24 hour management of active medical problems listed below. Physiatrist and rehab team continue to assess barriers to discharge/monitor patient progress toward functional and medical goals  Care Tool:  Bathing               Bathing assist Assist Level: Total Assistance - Patient < 25%     Upper Body Dressing/Undressing Upper body dressing   What is the patient wearing?: Pull over shirt    Upper body assist Assist Level: Total Assistance - Patient < 25%    Lower Body Dressing/Undressing Lower body dressing      What is the patient wearing?: Pants     Lower body assist Assist for lower body dressing: Maximal Assistance - Patient 25 - 49%     Toileting Toileting    Toileting assist Assist for toileting: 2 Helpers     Transfers Chair/bed transfer  Transfers assist     Chair/bed transfer assist level: Maximal Assistance - Patient 25 - 49%     Locomotion Ambulation   Ambulation assist      Assist level: Moderate Assistance - Patient 50 - 74% Assistive device: Orthosis (and wall rail) Max distance: 7'   Walk 10 feet activity   Assist  Walk 10 feet activity did not occur: Safety/medical concerns        Walk 50 feet activity   Assist Walk 50 feet with 2 turns activity did not occur: Safety/medical concerns         Walk 150 feet activity   Assist  Walk 150 feet activity did not occur: Safety/medical concerns         Walk 10 feet on uneven surface  activity   Assist Walk 10 feet on uneven surfaces activity did not occur: Safety/medical concerns         Wheelchair     Assist Will patient use wheelchair at discharge?: No   Wheelchair activity did not occur: N/A         Wheelchair 50 feet with 2 turns activity    Assist    Wheelchair 50 feet with 2 turns activity did not occur: N/A       Wheelchair 150 feet activity     Assist  Wheelchair 150 feet activity did not occur: N/A       Blood pressure 137/77, pulse 79, temperature 98 F (36.7 C), temperature source Oral, resp. rate 17, height '6\' 2"'$  (1.88 m), weight 102.9 kg, SpO2 99 %.  Medical Problem List and Plan: 1.  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  History of recurrent DVT/antithrombotics: -DVT/anticoagulation:  Pharmaceutical: Other (comment)--on Eliquis--will be on Eliquis lifelong after d/w Neuro and IM.              -antiplatelet therapy: DAPT 3. Pain Management: N/A 4. Mood: LCSW to follow for evaluation and support             -antipsychotic agents: On Pimavanserin daily 5. Neuropsych: This patient may not be fully capable of making decisions on his own behalf. 6. Skin/Wound Care: Treat/monitor MASD on sacrum and continue pressure relief measures.Also has 2 small skin tears- continue desitin              -- Monitor nutritional status and offer supplements as needed Po intake.   --added Juven twice daily to promote wound healing. 7. Fluids/Electrolytes/Nutrition: Monitor intake/output.  BUN/Creat nl will d/c IV 8.  T2DM: Resume metformin as > 48 hours since dye procedure. -- Will monitor blood sugars AC at bedtime.  Use SSI for elevated blood sugars as needed CBG (last 3)  Recent Labs    05/23/21 2112 05/24/21 0511 05/24/21 1129  GLUCAP 131* 126* 129*  Controlled 7/30  9.  Parkinson's disease: Tends to freeze per reports. On Azilect. Has slightly masked facies.  -- Continue Sinemet CR per home regimen--2.5 tabs BID during the day and one tap at nights. 10.  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 Louis Meckel) --last changed 7/13 by [facility]. 11.  History of CVA with left hemiplegia/dysphagia: Continue DAPT and statin. 12.  HTN: Monitor blood pressures 3 times daily with permissive hypertension systolic blood pressure around 170. Vitals:   05/23/21 1946 05/24/21 0359  BP: 133/69 137/77  Pulse: 80 79  Resp: 18 17  Temp: 98 F (36.7 C) 98 F (36.7 C)  SpO2: 97% 99%  Controlled  7/30 will monitor on current meds   13.  Parkinson's Dementia?: Continue Namenda and Pimavanderin (Nuplazid) for mood stabilization.  14. Impacted cerumen in ears- start Debrox drops x4 days both ears 15. Spasticity- has never been treated  with injections- might benefit from Botox of LUE.Discussed with pt and wife  would like to proceed, will inject  the following  Left Pec 50U, Biceps 100U, FCR 50, FDS 50 , LOS: 9 days A FACE TO FACE EVALUATION WAS PERFORMED  Martha Clan P Barton Want 05/24/2021, 12:07 PM

## 2021-05-24 NOTE — Progress Notes (Signed)
Speech Language Pathology Daily Session Note  Patient Details  Name: Robert [name] MRN: [id number] Date of Birth: [birth year]  Today's Date: 05/24/2021 SLP Individual Time: [id number] SLP Individual Time Calculation (min): 30 min  Short Term Goals: Week 2: SLP Short Term Goal 1 (Week 2): STG=LTG due to ELOS  Skilled Therapeutic Interventions:Skilled ST services focused on education. SLP completed education with pt's wife and pt, however pt appeared to be sleeping. Pt's wife requested diet downgrade to dys 1 textures due to oral bleeding and then once this has resolved requested dys 2 textures with gravy, SLP communicated requests with nurse. SLP provided education including diet handout of dys 2 and dys 3 textures, in which pt's wife can continue solid advancement at home. SLP provided education on current cognitive skills, requiring max-mod A for basic problem solving, can maintain sustained attention up to 15 minutes with min A verbal (cues however today pt was lethargic/sleeping due to vomiting this am) and mod A verbal cues for recall with visual aids. SLP provided education to utilize white board for recall, even when pt's wife leaving the room to make a meal or shower. All questions answered to satisfaction. Pt was left in room with wife, call bell within reach and bed alarm set. SLP recommends to continue skilled services     Pain Pain Assessment Pain Score: 0-No pain  Therapy/Group: Individual Therapy  Tela Kotecki  [facility] 05/24/2021, 3:41 PM

## 2021-05-24 NOTE — Progress Notes (Signed)
Occupational Therapy Session Note  Patient Details  Name: Robert [name] MRN: [id number] Date of Birth: Dec 14, 1945  Today's Date: 05/24/2021 OT Individual Time: 1400-1438 OT Individual Time Calculation (min): 38 min    Short Term Goals: Week 2:  OT Short Term Goal 1 (Week 2): STG = LTG 2/2 ELOS  Skilled Therapeutic Interventions/Progress Updates:    Pt received seated in w/c finishing lunch with wife assisting, no c/o pain, agreeable to therapy. Session focus on self-care retraining, activity tolerance, BUE/BLE strengthening, R NMR in prep for improved ADL/IADL/func mobility performance + decreased caregiver burden. Pt and wife relates excitement as pt just ambulated 34 ft with min A + HW! Stand-pivot to his R into the bed with use of bed rail and only min A to power up. Req increased time to turn and external cuing to prevent freezing, but only req CGA to complete. Completed massed practice of L toe taps onto external targets, 1x20 B hip abduction against level 1 resistive theraband, and 1x20 forward and cross body punches with level 1 resistive theraband. Demonstrates improved trunk rotational movements this date. Reviewed with wife to increase PROM of LUE post botox injection to at least 4 to 6x a day at each joint for 5 reps each for at least 5 second holds. Verbalized understanding. Completed with pt at each joint for 5 reps for 5 sec holds. STS with min A from bed and use of R bed rail, total A to doff shorts and B shoes/L AFO. Noted bleeding in lower gums, RN present for assessment.   Pt left seated EOB with wife/RN present.   Therapy Documentation Precautions:  Precautions Precautions: Fall Precaution Comments: L neck incision, hx of L hemiparesis Required Braces or Orthoses: Other Brace Other Brace: AFO LLE Restrictions Weight Bearing Restrictions: No  Pain: no c/o   ADL: See Care Tool for more details.  Therapy/Group: Individual Therapy  Volanda Napoleon MS,  OTR/L  05/24/2021, 6:56 AM

## 2021-05-24 NOTE — Progress Notes (Signed)
Orthopedic Tech Progress Note Patient Details:  Robert [name] Mar 09, 1946 [id number] Toe cap has been ordered from [employer].  Patient ID: Robert [name], male   DOB: 06-04-1946, 75 y.o.   MRN: [id number]  Jearld Lesch 05/24/2021, 4:13 PM

## 2021-05-25 LAB — GLUCOSE, CAPILLARY
Glucose-Capillary: 128 mg/dL — ABNORMAL HIGH (ref 70–99)
Glucose-Capillary: 202 mg/dL — ABNORMAL HIGH (ref 70–99)
Glucose-Capillary: 157 mg/dL — ABNORMAL HIGH (ref 70–99)
Glucose-Capillary: 139 mg/dL — ABNORMAL HIGH (ref 70–99)

## 2021-05-25 NOTE — Progress Notes (Signed)
PROGRESS NOTE   Subjective/Complaints: Bleeding from gums has stopped. Bleeding from scab on toe continues- appreciate RN Wells Guiles wrapping since we did not have band aid Wife at bedside- both very pleased with his progress.   ROS: +bleeding from ingrown toenail    Objective:   No results found. No results for input(s): WBC, HGB, HCT, PLT in the last 72 hours.  No results for input(s): NA, K, CL, CO2, GLUCOSE, BUN, CREATININE, CALCIUM in the last 72 hours.   Intake/Output Summary (Last 24 hours) at 05/25/2021 1731 Last data filed at 05/25/2021 1245 Gross per 24 hour  Intake 472 ml  Output 1525 ml  Net -1053 ml         Physical Exam: Vital Signs Blood pressure 125/63, pulse 66, temperature 97.6 F (36.4 C), temperature source Oral, resp. rate 18, height '6\' 2"'$  (1.88 m), weight 102.9 kg, SpO2 98 %. Gen: no distress, normal appearing HEENT: oral mucosa pink and moist, NCAT Cardio: Reg rate Chest: normal effort, normal rate of breathing Abd: soft, non-distended Ext: no edema  Skin: No evidence of breakdown, no evidence of rash Neurologic: HOH, a little slow to process, word finding deficits. Masked facies. Speech dysarthric. Cranial nerves II through XII intact, motor strength is 5/5 in right and 3-/5 left deltoid, bicep, tricep,  hip flexor, knee extensors, ankle dorsiflexor and plantar flexor TONE  LUE  Pec MAS3 Bicep MAS 3  Wrist flex MAS 3  Finger flex MAS 3  Has good grasp but limited release due to increased Left finger and wrist flexor tone    Assessment/Plan: 1. Functional deficits which require 3+ hours per day of interdisciplinary therapy in a comprehensive inpatient rehab setting. Physiatrist is providing close team supervision and 24 hour management of active medical problems listed below. Physiatrist and rehab team continue to assess barriers to discharge/monitor patient progress toward functional  and medical goals  Care Tool:  Bathing              Bathing assist Assist Level: Total Assistance - Patient < 25%     Upper Body Dressing/Undressing Upper body dressing   What is the patient wearing?: Pull over shirt    Upper body assist Assist Level: Total Assistance - Patient < 25%    Lower Body Dressing/Undressing Lower body dressing      What is the patient wearing?: Pants     Lower body assist Assist for lower body dressing: Maximal Assistance - Patient 25 - 49%     Toileting Toileting    Toileting assist Assist for toileting: 2 Helpers     Transfers Chair/bed transfer  Transfers assist     Chair/bed transfer assist level: Maximal Assistance - Patient 25 - 49%     Locomotion Ambulation   Ambulation assist      Assist level: Moderate Assistance - Patient 50 - 74% Assistive device: Orthosis (and wall rail) Max distance: 7'   Walk 10 feet activity   Assist  Walk 10 feet activity did not occur: Safety/medical concerns        Walk 50 feet activity   Assist Walk 50 feet with 2 turns activity did not occur: Safety/medical concerns  Walk 150 feet activity   Assist Walk 150 feet activity did not occur: Safety/medical concerns         Walk 10 feet on uneven surface  activity   Assist Walk 10 feet on uneven surfaces activity did not occur: Safety/medical concerns         Wheelchair     Assist Will patient use wheelchair at discharge?: No   Wheelchair activity did not occur: N/A         Wheelchair 50 feet with 2 turns activity    Assist    Wheelchair 50 feet with 2 turns activity did not occur: N/A       Wheelchair 150 feet activity     Assist  Wheelchair 150 feet activity did not occur: N/A       Blood pressure 125/63, pulse 66, temperature 97.6 F (36.4 C), temperature source Oral, resp. rate 18, height '6\' 2"'$  (1.88 m), weight 102.9 kg, SpO2 98 %.  Medical Problem List and Plan: 1.   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  History of recurrent DVT/antithrombotics: -DVT/anticoagulation:  Pharmaceutical: Other (comment)--on Eliquis--will be on Eliquis lifelong after d/w Neuro and IM.              -antiplatelet therapy: DAPT 3. Pain Management: N/A 4. Mood: LCSW to follow for evaluation and support             -antipsychotic agents: On Pimavanserin daily 5. Neuropsych: This patient may not be fully capable of making decisions on his own behalf. 6. Skin/Wound Care: Treat/monitor MASD on sacrum and continue pressure relief measures.Also has 2 small skin tears- continue desitin              -- Monitor nutritional status and offer supplements as needed Po intake.   --added Juven twice daily to promote wound healing. 7. Fluids/Electrolytes/Nutrition: Monitor intake/output.  BUN/Creat nl will d/c IV 8.  T2DM: Resume metformin as > 48 hours since dye procedure. -- Will monitor blood sugars AC at bedtime.  Use SSI for elevated blood sugars as needed CBG (last 3)  Recent Labs    05/25/21 0631 05/25/21 1117 05/25/21 1610  GLUCAP 128* 202* 157*  Controlled 7/30  9.  Parkinson's disease: Tends to freeze per reports. On Azilect. Has slightly masked facies.  -- Continue Sinemet CR per home regimen--2.5 tabs BID during the day and one tap at nights. 10.  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 Louis Meckel) --last changed 7/13 by [facility]. 11.  History of CVA with left hemiplegia/dysphagia: Continue DAPT and statin. 12.  HTN: Monitor blood pressures 3 times daily with permissive hypertension systolic blood pressure around 170. Vitals:   05/25/21 0537 05/25/21 1612  BP: (!) 145/81 125/63  Pulse: 87 66  Resp: 14 18  Temp: 98.7 F (37.1 C) 97.6 F (36.4 C)  SpO2: 97%  98%  Controlled  7/30 will monitor on current meds   13. Parkinson's Dementia?: Continue Namenda and Pimavanderin (Nuplazid) for mood stabilization.  14. Impacted cerumen in ears- start Debrox drops x4 days both ears 15. Spasticity- has never been treated  with injections- might benefit from Botox of LUE.Discussed with pt and  wife  would like to proceed, will inject the following  Left Pec 50U, Biceps 100U, FCR 50, FDS 50 -as per wife and therapy, spasticity much improved since Botox! 16. Ingrown toenail: please schedule for outpatient podiatry follow-up.  17. Bleeding gums: resolved. Advised wife she can purchase oragel outpatient if returns and is painful.  18. Wax in ears despite ear drops: given persistence and effect on patient's hearing, wife requests ENT consult if possible prior to d/c Wed. , LOS: 10 days A FACE TO FACE EVALUATION WAS [place] Donica Derouin 05/25/2021, 5:31 PM

## 2021-05-25 NOTE — Plan of Care (Signed)
  Problem: RH Balance Goal: LTG Patient will maintain dynamic standing balance (PT) Description: LTG:  Patient will maintain dynamic standing balance with assistance during mobility activities (PT) Flowsheets (Taken 05/25/2021 0639) LTG: Pt will maintain dynamic standing balance during mobility activities with:: Minimal Assistance - Patient > 75%   Problem: Sit to Stand Goal: LTG:  Patient will perform sit to stand with assistance level (PT) Description: LTG:  Patient will perform sit to stand with assistance level (PT) Flowsheets (Taken 05/25/2021 0639) LTG: PT will perform sit to stand in preparation for functional mobility with assistance level: Minimal Assistance - Patient > 75%   Problem: RH Bed to Chair Transfers Goal: LTG Patient will perform bed/chair transfers w/assist (PT) Description: LTG: Patient will perform bed to chair transfers with assistance (PT). Flowsheets (Taken 05/25/2021 [phone]) LTG: Pt will perform Bed to Chair Transfers with assistance level: Minimal Assistance - Patient > 75%   Problem: RH Car Transfers Goal: LTG Patient will perform car transfers with assist (PT) Description: LTG: Patient will perform car transfers with assistance (PT). Flowsheets (Taken 05/25/2021 [phone]) LTG: Pt will perform car transfers with assist:: Moderate Assistance - Patient 50 - 74%   Problem: RH Ambulation Goal: LTG Patient will ambulate in home environment (PT) Description: LTG: Patient will ambulate in home environment, # of feet with assistance (PT). Flowsheets Taken 05/25/2021 [id number] by Lorie Phenix, PT LTG: Pt will ambulate in home environ  assist needed:: Minimal Assistance - Patient > 75% Taken 05/16/2021 1725 by Max Sane, PT LTG: Ambulation distance in home environment: 25' w/ [facility]

## 2021-05-26 ENCOUNTER — Other Ambulatory Visit: Payer: Self-pay

## 2021-05-26 DIAGNOSIS — I6522 Occlusion and stenosis of left carotid artery: Secondary | ICD-10-CM

## 2021-05-26 LAB — BASIC METABOLIC PANEL
Anion gap: 8 (ref 5–15)
BUN: 11 mg/dL (ref 8–23)
CO2: 25 mmol/L (ref 22–32)
Calcium: 9.1 mg/dL (ref 8.9–10.3)
Chloride: 104 mmol/L (ref 98–111)
Creatinine, Ser: 0.98 mg/dL (ref 0.61–1.24)
GFR, Estimated: >60 mL/min (ref >60)
Glucose, Bld: 123 mg/dL — ABNORMAL HIGH (ref 70–99)
Potassium: 4.7 mmol/L (ref 3.5–5.1)
Sodium: 137 mmol/L (ref 135–145)

## 2021-05-26 LAB — CBC
HCT: 42.9 % (ref 39.0–52.0)
Hemoglobin: 14 g/dL (ref 13.0–17.0)
MCH: 29.3 pg (ref 26.0–34.0)
MCHC: 32.6 g/dL (ref 30.0–36.0)
MCV: 89.7 fL (ref 80.0–100.0)
Platelets: 313 10*3/uL (ref 150–400)
RBC: 4.78 MIL/uL (ref 4.22–5.81)
RDW: 13.6 % (ref 11.5–15.5)
WBC: 6.7 10*3/uL (ref 4.0–10.5)
nRBC: 0 % (ref 0.0–0.2)

## 2021-05-26 LAB — GLUCOSE, CAPILLARY
Glucose-Capillary: 113 mg/dL — ABNORMAL HIGH (ref 70–99)
Glucose-Capillary: 141 mg/dL — ABNORMAL HIGH (ref 70–99)
Glucose-Capillary: 130 mg/dL — ABNORMAL HIGH (ref 70–99)
Glucose-Capillary: 147 mg/dL — ABNORMAL HIGH (ref 70–99)

## 2021-05-26 MED ORDER — FUROSEMIDE 20 MG PO TABS
20.0000 mg | ORAL_TABLET | Freq: Every day | ORAL | Status: AC
  Administered 2021-05-26 – 2021-05-27 (×2): 20 mg via ORAL
  Filled 2021-05-26 (×2): qty 1

## 2021-05-26 MED ORDER — SENNOSIDES-DOCUSATE SODIUM 8.6-50 MG PO TABS
1.0000 | ORAL_TABLET | Freq: Every day | ORAL | Status: DC
  Administered 2021-05-27 – 2021-05-28 (×2): 1 via ORAL
  Filled 2021-05-26 (×2): qty 1

## 2021-05-26 NOTE — Progress Notes (Signed)
Occupational Therapy Note  Patient Details  Name: Robert [name] MRN: [id number] Date of Birth: Nov 12, 1945  Today's Date: 05/26/2021 OT Missed Time: 75 Minutes Missed Time Reason: Unavailable (comment);Patient unwilling/refused to participate without medical reason  Pt resting in bed upon arrival with wife present. Pt's wife reported to OTA that she was waiting for MD and pt couldn't do anything because "something is not right." Offered to check back later.  Pt missed 75 mins skilled OT services.    Leotis Shames [facility] 05/26/2021, 9:09 AM

## 2021-05-26 NOTE — Progress Notes (Signed)
Speech Language Pathology Daily Session Note  Patient Details  Name: Robert [name] MRN: [id number] Date of Birth: 1946/08/09  Today's Date: 05/26/2021 SLP Individual Time: 1300-1345 SLP Individual Time Calculation (min): 45 min  Short Term Goals: Week 2: SLP Short Term Goal 1 (Week 2): STG=LTG due to ELOS  Skilled Therapeutic Interventions: Patient agreeable to skilled ST intervention with focus on cognitive and swallowing goals. Patient and spouse denied oral bleeding or discomfort at this time however continue to request Dys 1 diet to ensure no additional oral irritation is caused. Spouse suspects oral bleeding was caused from infrequent dental care and irritation gum irritation. Oral cavity appeared unremarkable with no discoloration or active bleeding. Reinforced education on Dys 2 and Dys 3 textures in which spouse can continue solid advancement at home as tolerated. SLP facilitated basic problem solving, verbal reasoning, and sustained attention skills with generating words within categories based on selected initial letter. Patient completed with min-to-mod A verbal/visual cues for recall and alternating attention between categories. Patient reported nausea at end of session. HOB was elevated and patient was provided with emesis bag in the event of vomiting. NT and Nurse were both notified. Patient was left in bed with alarm activated and immediate needs within reach at end of session. Continue per current plan of care.      Pain Pain Assessment Pain Scale: 0-10 Pain Score: 0-No pain  Therapy/Group: Individual Therapy  Patty Sermons 05/26/2021, 1:45 PM

## 2021-05-26 NOTE — Progress Notes (Signed)
PROGRESS NOTE   Subjective/Complaints: Small bruise left biceps from injection site, No pain c/os, walked 48 ft with therapy , HW and AFO  ROS: +bleeding from ingrown toenail    Objective:   No results found. No results for input(s): WBC, HGB, HCT, PLT in the last 72 hours.  No results for input(s): NA, K, CL, CO2, GLUCOSE, BUN, CREATININE, CALCIUM in the last 72 hours.   Intake/Output Summary (Last 24 hours) at 05/26/2021 0656 Last data filed at 05/26/2021 0500 Gross per 24 hour  Intake 533 ml  Output 3025 ml  Net -2492 ml          Physical Exam: Vital Signs Blood pressure 135/74, pulse 83, temperature 98.8 F (37.1 C), temperature source Oral, resp. rate 20, height '6\' 2"'$  (1.88 m), weight 102.9 kg, SpO2 98 %.  General: No acute distress Mood and affect are appropriate Heart: Regular rate and rhythm no rubs murmurs or extra sounds Lungs: Clear to auscultation, breathing unlabored, no rales or wheezes Abdomen: Positive bowel sounds, soft nontender to palpation, nondistended Extremities: No clubbing, cyanosis, 1+ bilateral pedal edema Skin: No evidence of breakdown, no evidence of rash  Neurologic: HOH, a little slow to process, word finding deficits. Masked facies. Speech dysarthric. Cranial nerves II through XII intact, motor strength is 5/5 in right and 3-/5 left deltoid, bicep, tricep,  hip flexor, knee extensors, ankle dorsiflexor and plantar flexor TONE  LUE  Pec MAS3 Bicep MAS 2 Wrist flex MAS 3  Finger flex MAS 3  Has good grasp but limited release due to increased Left finger and wrist flexor tone    Assessment/Plan: 1. Functional deficits which require 3+ hours per day of interdisciplinary therapy in a comprehensive inpatient rehab setting. Physiatrist is providing close team supervision and 24 hour management of active medical problems listed below. Physiatrist and rehab team continue to assess  barriers to discharge/monitor patient progress toward functional and medical goals  Care Tool:  Bathing              Bathing assist Assist Level: Total Assistance - Patient < 25%     Upper Body Dressing/Undressing Upper body dressing   What is the patient wearing?: Pull over shirt    Upper body assist Assist Level: Total Assistance - Patient < 25%    Lower Body Dressing/Undressing Lower body dressing      What is the patient wearing?: Pants     Lower body assist Assist for lower body dressing: Maximal Assistance - Patient 25 - 49%     Toileting Toileting    Toileting assist Assist for toileting: 2 Helpers     Transfers Chair/bed transfer  Transfers assist     Chair/bed transfer assist level: Maximal Assistance - Patient 25 - 49%     Locomotion Ambulation   Ambulation assist      Assist level: Moderate Assistance - Patient 50 - 74% Assistive device: Orthosis (and wall rail) Max distance: 7'   Walk 10 feet activity   Assist  Walk 10 feet activity did not occur: Safety/medical concerns        Walk 50 feet activity   Assist Walk 50 feet with 2  turns activity did not occur: Safety/medical concerns         Walk 150 feet activity   Assist Walk 150 feet activity did not occur: Safety/medical concerns         Walk 10 feet on uneven surface  activity   Assist Walk 10 feet on uneven surfaces activity did not occur: Safety/medical concerns         Wheelchair     Assist Will patient use wheelchair at discharge?: No   Wheelchair activity did not occur: N/A         Wheelchair 50 feet with 2 turns activity    Assist    Wheelchair 50 feet with 2 turns activity did not occur: N/A       Wheelchair 150 feet activity     Assist  Wheelchair 150 feet activity did not occur: N/A       Blood pressure 135/74, pulse 83, temperature 98.8 F (37.1 C), temperature source Oral, resp. rate 20, height '6\' 2"'$  (1.88 m),  weight 102.9 kg, SpO2 98 %.  Medical Problem List and Plan: 1.  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  History of recurrent DVT/antithrombotics: -DVT/anticoagulation:  Pharmaceutical: Other (comment)--on Eliquis--will be on Eliquis lifelong after d/w Neuro and IM.              -antiplatelet therapy: DAPT 3. Pain Management: N/A 4. Mood: LCSW to follow for evaluation and support             -antipsychotic agents: On Pimavanserin daily 5. Neuropsych: This patient may not be fully capable of making decisions on his own behalf. 6. Skin/Wound Care: Treat/monitor MASD on sacrum and continue pressure relief measures.Also has 2 small skin tears- continue desitin              -- Monitor nutritional status and offer supplements as needed Po intake.   --added Juven twice daily to promote wound healing. 7. Fluids/Electrolytes/Nutrition: Monitor intake/output.  some pedal edema , will give lasix x 2 d 8.  T2DM: Resume metformin as > 48 hours since dye procedure. -- Will monitor blood sugars AC at bedtime.  Use SSI for elevated blood sugars as needed CBG (last 3)  Recent Labs    05/25/21 1610 05/25/21 2106 05/26/21 0610  GLUCAP 157* 139* 113*   Controlled 8/1  9.  Parkinson's disease: Tends to freeze per reports. On Azilect. Has slightly masked facies.  -- Continue Sinemet CR per home regimen--2.5 tabs BID during the day and one tap at nights. 10.  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 Louis Meckel) --last changed 7/13 by [facility]. 11.  History of CVA with left hemiplegia/dysphagia: Continue DAPT and statin. 12.  HTN: controlled 8/1- monitor  on diuretic, may stop flomax Vitals:   05/25/21 1957 05/26/21 0616  BP: 115/67 135/74  Pulse: 71 83  Resp: 20 20  Temp: 97.7 F (36.5 C) 98.8 F (37.1  C)  SpO2: 98% 98%    13. Parkinson's Dementia?: Continue Namenda and Pimavanderin (Nuplazid) for mood stabilization.  14. Impacted cerumen in ears- start Debrox drops x4 days both ears 15. Spasticity- Left Pec 50U, Biceps 100U, FCR 50, FDS 50 Xeomin should take  effect later this week  16. Ingrown toenail: please schedule for outpatient podiatry follow-up.  17. Bleeding gums: resolved. Advised wife she can purchase oragel outpatient if returns and is painful.  18. Wax in ears may use bulb syringe , LOS: 11 days A FACE TO FACE EVALUATION WAS PERFORMED  Robert [name] 05/26/2021, 6:56 AM

## 2021-05-26 NOTE — Progress Notes (Signed)
Physical Therapy Session Note  Patient Details  Name: Robert [name] MRN: [id number] Date of Birth: [birth year]  Today's Date: 05/26/2021 PT Individual Time: 0956-1110 PT Individual Time Calculation (min): 74 min   Short Term Goals: Week 1:  PT Short Term Goal 1 (Week 1): STG=LTG due to ELOS   Skilled Therapeutic Interventions/Progress Updates:  Patient seated EOB on entrance to room. Patient alert and agreeable to PT session. Patient denied pain during session.  Therapeutic Activity: Transfers: Patient performed SPVT transfers to R bed --> w/c with CGA/ Min A.  Initial freezing overcome and pt able to take small pivot steps halfway to w/c. After ambulation, pt guided in SPVT transfer training w/c to standard armchair and back to w/c. All SPVTs to R side. D/t fatigue, pt with difficulty stepping LLE despite effort and then requiring move of hips to seat despite minimal foot movement. Pt states that he could not pick up L foot despite vc for weight shifting, and did not want assist for LLE movement. Required Mod A to bring hips to seat x2. Provided verbal cues for stepping and technique throughout.  Gait Training:  Patient ambulated 66' x1/ 71' x1 using HW with CGA. Demonstrated good lift of LLE throughout with minimal catches of L toe, however it is noted that toe cap to shoe would provide assistance for LLE advancement at this time. Provided vc/ tc for increased step height, continuous reciprocation.  Neuromuscular Re-ed: NMR facilitated during session with focus on LLE muscle activation during transfer training. Pt guided in lateral stepping to R side. Only able to take small, incremental steps covering ~3 feet with 6 efforts. Seated hip flexion to max range performed at end of session in order to activate musculature associated with ambulation and stepping in order promote movement during fatigue. NMR performed for improvements in motor control and coordination, balance, sequencing, judgement,  and self confidence/ efficacy in performing all aspects of mobility at highest level of independence.    Patient seated  in w/c at end of session with brakes locked, and all needs within reach. NT present and changing bed sheets. NT to assist wife with transfer back to bed for rest prior to next therapy session. RN arriving with Parkinson's medications that have just arrived from pharmacy.      Therapy Documentation Precautions:  Precautions Precautions: Fall Precaution Comments: L neck incision, hx of L hemiparesis Required Braces or Orthoses: Other Brace Other Brace: AFO LLE Restrictions Weight Bearing Restrictions: No  Therapy/Group: Individual Therapy  [name] PT, DPT 05/26/2021, 1:04 PM

## 2021-05-27 LAB — GLUCOSE, CAPILLARY
Glucose-Capillary: 117 mg/dL — ABNORMAL HIGH (ref 70–99)
Glucose-Capillary: 135 mg/dL — ABNORMAL HIGH (ref 70–99)
Glucose-Capillary: 132 mg/dL — ABNORMAL HIGH (ref 70–99)
Glucose-Capillary: 114 mg/dL — ABNORMAL HIGH (ref 70–99)

## 2021-05-27 MED ORDER — CARBIDOPA-LEVODOPA ER 25-100 MG PO TBCR: 1.0000 | EXTENDED_RELEASE_TABLET | ORAL | Status: DC

## 2021-05-27 MED ORDER — POLYETHYLENE GLYCOL 3350 17 G PO PACK: 17.0000 g | PACK | Freq: Every day | ORAL | Status: DC

## 2021-05-27 MED ORDER — ACETAMINOPHEN 325 MG PO TABS: 325.0000 mg | ORAL_TABLET | ORAL | Status: DC | PRN

## 2021-05-27 MED ORDER — LIDOCAINE 5 % EX PTCH: 1.0000 | MEDICATED_PATCH | Freq: Every day | CUTANEOUS | Status: DC | PRN

## 2021-05-27 MED ORDER — CLOPIDOGREL BISULFATE 75 MG PO TABS: 75.0000 mg | ORAL_TABLET | Freq: Every day | ORAL | 0 refills | Status: DC

## 2021-05-27 MED ORDER — APIXABAN 5 MG PO TABS: 5.0000 mg | ORAL_TABLET | Freq: Two times a day (BID) | ORAL | 0 refills | Status: AC

## 2021-05-27 NOTE — Progress Notes (Signed)
PROGRESS NOTE   Subjective/Complaints: Wife is a little nervous about going home tomorrow , wants to use WC transport   ROS: Left arm feels looser, no CP, no SOB , neg N/V/D    Objective:   No results found. Recent Labs    05/26/21 0635  WBC 6.7  HGB 14.0  HCT 42.9  PLT 313    Recent Labs    05/26/21 0635  NA 137  K 4.7  CL 104  CO2 25  GLUCOSE 123*  BUN 11  CREATININE 0.98  CALCIUM 9.1     Intake/Output Summary (Last 24 hours) at 05/27/2021 0818 Last data filed at 05/27/2021 0700 Gross per 24 hour  Intake 480 ml  Output 800 ml  Net -320 ml          Physical Exam: Vital Signs Blood pressure 133/66, pulse 82, temperature 98.7 F (37.1 C), temperature source Oral, resp. rate 17, height '6\' 2"'$  (1.88 m), weight 102.9 kg, SpO2 96 %.  General: No acute distress Mood and affect are appropriate Heart: Regular rate and rhythm no rubs murmurs or extra sounds Lungs: Clear to auscultation, breathing unlabored, no rales or wheezes Abdomen: Positive bowel sounds, soft nontender to palpation, nondistended Extremities: No clubbing, cyanosis, or edema Skin: No evidence of breakdown, no evidence of rash  Neurologic: HOH, a little slow to process, word finding deficits. Masked facies. Speech dysarthric. Cranial nerves II through XII intact, motor strength is 5/5 in right and 3-/5 left deltoid, bicep, tricep,  hip flexor, knee extensors, ankle dorsiflexor and plantar flexor TONE  LUE  Pec MAS3 Bicep MAS 2 Wrist flex MAS 3  Finger flex MAS 3  Has good grasp but limited release due to increased Left finger and wrist flexor tone    Assessment/Plan: 1. Functional deficits which require 3+ hours per day of interdisciplinary therapy in a comprehensive inpatient rehab setting. Physiatrist is providing close team supervision and 24 hour management of active medical problems listed below. Physiatrist and rehab team  continue to assess barriers to discharge/monitor patient progress toward functional and medical goals  Care Tool:  Bathing              Bathing assist Assist Level: Total Assistance - Patient < 25%     Upper Body Dressing/Undressing Upper body dressing   What is the patient wearing?: Pull over shirt    Upper body assist Assist Level: Total Assistance - Patient < 25%    Lower Body Dressing/Undressing Lower body dressing      What is the patient wearing?: Pants     Lower body assist Assist for lower body dressing: Maximal Assistance - Patient 25 - 49%     Toileting Toileting    Toileting assist Assist for toileting: 2 Helpers     Transfers Chair/bed transfer  Transfers assist     Chair/bed transfer assist level: Maximal Assistance - Patient 25 - 49%     Locomotion Ambulation   Ambulation assist      Assist level: Moderate Assistance - Patient 50 - 74% Assistive device: Orthosis (and wall rail) Max distance: 7'   Walk 10 feet activity   Assist  Walk 10  feet activity did not occur: Safety/medical concerns        Walk 50 feet activity   Assist Walk 50 feet with 2 turns activity did not occur: Safety/medical concerns         Walk 150 feet activity   Assist Walk 150 feet activity did not occur: Safety/medical concerns         Walk 10 feet on uneven surface  activity   Assist Walk 10 feet on uneven surfaces activity did not occur: Safety/medical concerns         Wheelchair     Assist Will patient use wheelchair at discharge?: No   Wheelchair activity did not occur: N/A         Wheelchair 50 feet with 2 turns activity    Assist    Wheelchair 50 feet with 2 turns activity did not occur: N/A       Wheelchair 150 feet activity     Assist  Wheelchair 150 feet activity did not occur: N/A       Blood pressure 133/66, pulse 82, temperature 98.7 F (37.1 C), temperature source Oral, resp. rate 17, height  '6\' 2"'$  (1.88 m), weight 102.9 kg, SpO2 96 %.  Medical Problem List and Plan: 1.  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  History of recurrent DVT/antithrombotics: -DVT/anticoagulation:  Pharmaceutical: Other (comment)--on Eliquis--will be on Eliquis lifelong after d/w Neuro and IM.              -antiplatelet therapy: DAPT 3. Pain Management: N/A 4. Mood: LCSW to follow for evaluation and support             -antipsychotic agents: On Pimavanserin daily 5. Neuropsych: This patient may not be fully capable of making decisions on his own behalf. 6. Skin/Wound Care: Treat/monitor MASD on sacrum and continue pressure relief measures.Also has 2 small skin tears- continue desitin              -- Monitor nutritional status and offer supplements as needed Po intake.   --added Juven twice daily to promote wound healing. 7. Fluids/Electrolytes/Nutrition: Monitor intake/output.  some pedal edema , will give lasix x 2 d 8.  T2DM: Resume metformin as > 48 hours since dye procedure. -- Will monitor blood sugars AC at bedtime.  Use SSI for elevated blood sugars as needed CBG (last 3)  Recent Labs    05/26/21 1618 05/26/21 2027 05/27/21 0612  GLUCAP 130* 147* 117*   Controlled 8/1  9.  Parkinson's disease: Tends to freeze per reports. On Azilect. Has slightly masked facies.  -- Continue Sinemet CR per home regimen--2.5 tabs BID during the day and one tap at nights. 10.  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 Louis Meckel) --last changed 7/13 by [facility]. 11.  History of CVA with left hemiplegia/dysphagia: Continue DAPT and statin. 12.  HTN: controlled 8/1- monitor  on diuretic, may stop flomax Vitals:   05/27/21 0615 05/27/21 0704  BP: (!) 187/107 133/66  Pulse: 78 82  Resp: 17 17  Temp: 99.5 F (37.5  C) 98.7 F (37.1 C)  SpO2: 100% 96%   Some lability , ? 0615 was in error  13. Parkinson's Dementia?:  Continue Namenda and Pimavanderin (Nuplazid) for mood stabilization.  14. Impacted cerumen in ears- start Debrox drops x4 days both ears 15. Spasticity- Left Pec 50U, Biceps 100U, FCR 50, FDS 50 Xeomin should take effect later this week  16. Ingrown toenail: please schedule for outpatient podiatry follow-up.  17. Bleeding gums: resolved. Advised wife she can purchase oragel outpatient if returns and is painful.  18. Wax in ears may use bulb syringe , LOS: 12 days A FACE TO FACE EVALUATION WAS PERFORMED  Charlett Blake 05/27/2021, 8:18 AM

## 2021-05-27 NOTE — Progress Notes (Signed)
Speech Language Pathology Discharge Summary  Patient Details  Name: Robert [name] MRN: [id number] Date of Birth: Nov 03, 1945  Today's Date: 05/27/2021 SLP Individual Time: 1300-1330 SLP Individual Time Calculation (min): 30 min   Skilled Therapeutic Interventions:  Patient agreeable to skilled ST intervention with focus on cognitive goals. Facilitated sustained attention, problem solving/reasoning, information processing, and recall through familiar card game UNO which was introduced in prior ST session. Spouse accompanied patient during today's session and also participated in game. Patient completed at supervision level and benefited from occasional reminders of task instructions and additional processing time. Patient was observed coaching spouse on rules and helping her problem solve through task which demonstrated great carry over of skill from previous introduction of UNO. Patient sustained attention for duration of 30 minute session with minimal re-direction. Neither patient or spouse had additional questions for discharge. Patient was left in bed with alarm activated and immediate needs within reach at end of session. Continue per current plan of care.    Patient has met 5 of 5 long term goals. Patient to discharge at [facility] level.  Reasons goals not met: NA   Clinical Impression/Discharge Summary: Patient has made functional gains and has met 5 of 5 long-term goals this admission due to improved oropharyngeal swallow function, cognitive-communication skills including orientation, sustained attention, and ability to utilize word finding strategies in conversation to promote ability to communicate functional needs and engage in socialization. Spouse was present for each treatment session and receptive to consistent education opportunities which resulted in improved understanding of patient's cognitive-communication deficits which consequently improved her ability to implement beneficial  strategies to maximize patient's functional independence including additional processing time, word finding, and recall. Patient is currently an overall min A for basic cognitive tasks and Mod A for higher level/complex cognitive tasks. He requires sup A verbal/visual cues for utilization of compensatory memory, orientation, and word finding strategies. Continues to benefit from extended processing time and verbal repetition for most tasks and conversation. He is able to effectively communicate functional needs with occasional word finding difficulty and/or pausing for additional processing and recall. Spouse endorsed patient is near baseline level of functioning from a cognitive standpoint considering baseline deficits as result of Parkinson's and prior CVA hx. Patient was progressed to Dys 3 diet and thin liquids as of 7/29  however reported oral discomfort and bleeding gums resulting in request to downgrade to Dys 1 textures as of 7/30 for comfort. No visible bleeding/redness/irritation observed per recent oral mech exam. Patient to discharge on Dys 1 textures per spouse request. When patient is not experiencing discomfort, he demonstrated appropriateness for Dys 3 diet and would be appropriate to advance diet at home as tolerated. Spouse has been educated on Dys 1, 2, and 3 textures and verbalized understanding through teach back and demonstration using diet handout. Patient and family education is complete and patient will discharge home with 24 hour supervision from family. F/u SLP services do not appear warranted at this time, as patient is near baseline cognitive function per spouse report and spouse has been educated to optimize cognitive-communication skills despite these deficits.   Care Partner:  Caregiver Able to Provide Assistance: Yes  Type of Caregiver Assistance: Physical;Cognitive  Recommendation:  24 hour supervision/assistance     Equipment: NA   Reasons for discharge: Treatment goals  met   Patient/Family Agrees with Progress Made and Goals Achieved: Yes   Robert [name], M.S., CCC-SLP   Robert [name] 05/27/2021, 4:21 PM

## 2021-05-27 NOTE — Progress Notes (Signed)
Patient ID: Robert [name], male   DOB: Oct 10, 1946, 75 y.o.   MRN: [id number]  SW met with patient spouse to confirm d/c instructions for tomorrow. Patient set to d/c home with [facility] on tomorrow. Patient [place] orders faxed to [employer]. Orders and d/c summary will be faxed to patient's PCP office on tomorrow to Dr. Marjo Bicker at [phone]. SW will arrange [facility]AR for d/c home on tomorrow. Spouse prefers transportation is arranged at 10 AM to ensure patient is discharged on time tomorrow. Patient will be seen by [facility] on tomorrow at [place], [place]

## 2021-05-27 NOTE — Plan of Care (Signed)
  Problem: RH Dressing Goal: LTG Patient will perform lower body dressing w/assist (OT) Description: LTG: Patient will perform lower body dressing with assist, with/without cues in positioning using equipment (OT) Outcome: Not Met (add Reason) Flowsheets (Taken 05/27/2021 1454) LTG: Pt will perform lower body dressing with assistance level of: (Pt cont to req heavy A to thread BLE and pull over L hip.) -- Note: Pt cont to req heavy A to thread BLE and pull over L hip.   Problem: RH Toilet Transfers Goal: LTG Patient will perform toilet transfers w/assist (OT) Description: LTG: Patient will perform toilet transfers with assist, with/without cues using equipment (OT) Outcome: Not Met (add Reason) Flowsheets (Taken 05/27/2021 1454) LTG: Pt will perform toilet transfers with assistance level of: (Pt cont to rely on stedy for safe toilet transfers.) -- Note: Pt cont to rely on stedy for safe toilet transfers.   Problem: RH Tub/Shower Transfers Goal: LTG Patient will perform tub/shower transfers w/assist (OT) Description: LTG: Patient will perform tub/shower transfers with assist, with/without cues using equipment (OT) Outcome: Not Met (add Reason) Flowsheets (Taken 05/27/2021 1454) LTG: Pt will perform tub/shower stall transfers with assistance level of: (Pt cont to rely on stedy for safe shower bench transfers.) -- Note: Pt cont to rely on stedy for safe shower bench transfers.   Problem: RH Balance Goal: LTG: Patient will maintain dynamic sitting balance (OT) Description: LTG:  Patient will maintain dynamic sitting balance with assistance during activities of daily living (OT) Outcome: Completed/Met Goal: LTG Patient will maintain dynamic standing with ADLs (OT) Description: LTG:  Patient will maintain dynamic standing balance with assist during activities of daily living (OT)  Outcome: Completed/Met Flowsheets (Taken 05/27/2021 1454) LTG: Pt will maintain dynamic standing balance during ADLs  with: Contact Guard/Touching assist   Problem: Sit to Stand Goal: LTG:  Patient will perform sit to stand in prep for activites of daily living with assistance level (OT) Description: LTG:  Patient will perform sit to stand in prep for activites of daily living with assistance level (OT) Outcome: Completed/Met   Problem: RH Grooming Goal: LTG Patient will perform grooming w/assist,cues/equip (OT) Description: LTG: Patient will perform grooming with assist, with/without cues using equipment (OT) Outcome: Completed/Met   Problem: RH Bathing Goal: LTG Patient will bathe all body parts with assist levels (OT) Description: LTG: Patient will bathe all body parts with assist levels (OT) Outcome: Completed/Met   Problem: RH Toileting Goal: LTG Patient will perform toileting task (3/3 steps) with assistance level (OT) Description: LTG: Patient will perform toileting task (3/3 steps) with assistance level (OT)  Outcome: Completed/Met   Problem: RH Functional Use of Upper Extremity Goal: LTG Patient will use RT/LT upper extremity as a (OT) Description: LTG: Patient will use right/left upper extremity as a stabilizer/gross assist/diminished/nondominant/dominant level with assist, with/without cues during functional activity (OT) Outcome: Completed/Met   Problem: RH Attention Goal: LTG Patient will demonstrate this level of attention during functional activites (OT) Description: LTG:  Patient will demonstrate this level of attention during functional activites  (OT) Outcome: Completed/Met

## 2021-05-27 NOTE — Progress Notes (Signed)
Patient ID: Robert [name], male   DOB: May 30, 1946, 75 y.o.   MRN: [id number]  Transport scheduled for [place], [place]

## 2021-05-27 NOTE — Progress Notes (Signed)
Occupational Therapy Discharge Summary  Patient Details  Name: Robert [name] MRN: [id number] Date of Birth: 26-Jan-1946  Today's Date: 05/27/2021 OT Individual Time: 1052-1200 OT Individual Time Calculation (min): 68 min + 47 min   Patient has met 8 of 11 long term goals due to improved activity tolerance, improved balance, postural control, ability to compensate for deficits, functional use of  RIGHT upper extremity, improved attention, improved awareness, and improved coordination.  Patient to discharge at [facility] Max Assist level.  Patient's care partner is independent to provide the necessary physical and cognitive assistance at discharge.    Reasons goals not met: Pt cont to rely on stedy for bathroom transfers, has progressed from needing max A to power up to light min A to CGA. Cont to progress in performing safe stand-pivot transfers with HW. Additionally, is progressing in LBD, currently req max A to don pants, but is able to assist by pulling up pants past R hip + pulling pants up over B knees. Wife typically performs ADL at home and utilizes stedy for bathroom transfers at baseline.  Recommendation:  Patient will benefit from ongoing skilled OT services in home health setting to continue to advance functional skills in the area of BADL and Reduce care partner burden.  Equipment: No equipment provided  Reasons for discharge: discharge from hospital  Patient/family agrees with progress made and goals achieved: Yes  OT Discharge Precautions/Restrictions  Precautions Precautions: Fall Precaution Comments: L neck incision, hx of L hemiparesis Required Braces or Orthoses: Other Brace Other Brace: AFO LLE Pain Pain Assessment Pain Scale: Faces Pain Score: 0-No pain Faces Pain Scale: No hurt ADL ADL Eating: Minimal assistance Where Assessed-Eating: Wheelchair, Bed level Grooming: Supervision/safety Where Assessed-Grooming: Sitting at sink Upper Body Bathing: Minimal  assistance Where Assessed-Upper Body Bathing: Wheelchair Lower Body Bathing: Minimal assistance Where Assessed-Lower Body Bathing: Wheelchair Upper Body Dressing: Moderate assistance Where Assessed-Upper Body Dressing: Wheelchair Lower Body Dressing: Maximal assistance Where Assessed-Lower Body Dressing: Wheelchair Toileting: Maximal assistance Where Assessed-Toileting: Glass blower/designer: Dependent Armed forces technical officer Method: Other (comment) (stedy) Science writer: Raised toilet seat, Other (comment) (stedy) Tub/Shower Transfer: Not assessed Tub/Shower Transfer Method:  (At home - used swivel tub transfer bench and stedy) Tub/Shower Equipment: Facilities manager: Not assessed ADL Comments: Wife heavilly involved in completing most of ADL. Reports patient just weaker now Vision Baseline Vision/History: Wears glasses Wears Glasses: Reading only;Distance only (one pair for reading, one for TV) Patient Visual Report: No change from baseline Vision Assessment?: Yes Eye Alignment: Within Functional Limits Ocular Range of Motion: Within Functional Limits Alignment/Gaze Preference: Gaze left Tracking/Visual Pursuits: Able to track stimulus in all quads without difficulty Saccades: Decreased speed of saccadic movement Convergence: Impaired (comment) Visual Fields: No apparent deficits Perception  Perception: Impaired Inattention/Neglect: Does not attend to left side of body Comments: mild Praxis Praxis: Intact Cognition Overall Cognitive Status: Impaired/Different from baseline (with history of baseline cognitive deficits) Arousal/Alertness: Awake/alert Orientation Level: Oriented X4 Attention: Sustained Sustained Attention: Impaired Memory: Impaired Memory Impairment: Decreased recall of new information;Decreased short term memory Immediate Memory Recall: Sock Memory Recall Sock: Not able to recall Memory Recall Blue: Without Cue Memory  Recall Bed: With Cue Awareness: Impaired Problem Solving: Impaired Safety/Judgment: Impaired Sensation Sensation Light Touch: Impaired Detail Peripheral sensation comments: impaired LT to L digits, reports tingling in R digits Hot/Cold: Appears Intact Proprioception: Impaired by gross assessment (chronic L hemi) Stereognosis: Impaired by gross assessment (chronic L hemi) Coordination Gross Motor Movements are Fluid  and Coordinated: No Fine Motor Movements are Fluid and Coordinated: No Coordination and Movement Description: Rigid movement, left hemiplegia, freezing at times Finger Nose Finger Test: mild dysmetria in RUE, unable to complete LUE Motor  Motor Motor: Hemiplegia;Abnormal tone Motor - Discharge Observations: Dense left hemiplegia, increased tension left elbow/hand. Mobility  Bed Mobility Bed Mobility: Supine to Sit;Sit to Supine Supine to Sit: Minimal Assistance - Patient > 75% Sit to Supine: Moderate Assistance - Patient 50-74% Transfers Sit to Stand: Minimal Assistance - Patient > 75% Stand to Sit: Contact Guard/Touching assist  Trunk/Postural Assessment  Cervical Assessment Cervical Assessment: Exceptions to [facility] (gaze L, forward head) Thoracic Assessment Thoracic Assessment: Exceptions to [facility] (rounded shoulders) Lumbar Assessment Lumbar Assessment: Exceptions to [facility] (posterior pelvic tilt) Postural Control Postural Control: Deficits on evaluation  Balance Balance Balance Assessed: Yes Static Sitting Balance Static Sitting - Balance Support: Feet unsupported Static Sitting - Level of Assistance: 5: Stand by assistance Static Sitting - Comment/# of Minutes: S Dynamic Sitting Balance Dynamic Sitting - Balance Support: Feet supported;Right upper extremity supported Dynamic Sitting - Level of Assistance: 5: Stand by assistance Dynamic Sitting Balance - Compensations: S Static Standing Balance Static Standing - Balance Support: Right upper extremity  supported Static Standing - Level of Assistance: 5: Stand by assistance Static Standing - Comment/# of Minutes: S to CGA Dynamic Standing Balance Dynamic Standing - Balance Support: Right upper extremity supported;During functional activity Dynamic Standing - Level of Assistance: 5: Stand by assistance Dynamic Standing - Comments: CGA Extremity/Trunk Assessment RUE Assessment RUE Assessment: Within Functional Limits Active Range of Motion (AROM) Comments: ~100 degrees of shoulder flexion - right General Strength Comments: 4-/5 in shoulder flexion, grossly WFL for ADL LUE Assessment LUE Assessment: Exceptions to [facility] General Strength Comments: Tone in elbow, digits, improved since botox injection LUE Body System: Neuro Brunstrum levels for arm and hand: Arm;Hand Brunstrum level for arm: Stage II Synergy is developing;Stage I Presynergy Brunstrum level for hand: Stage I Flaccidity  Session 1 ([id number]): Pt received seated in w/c with wife present, no c/o pain, agreeable to therapy. Session focus on self-care retraining, activity tolerance, generalized conditioning, func transfers, static/dynamic standing balance in prep for improved ADL/func mobility performance + decreased caregiver burden. DC reassessments completed as documented above. Pt agreeable to simulate bathing and dressing. Pt able to don oversized scrub sheet with mod A to thread LUE + don shirt over head. Able to doff with min A to pull over head. Donned oversized scrub pants with max A, able to assist with pull pants up over B thighs and pulling over R hip in standing, min A for STS with HW. Additionally, completed simulated bathing with min A to bathe RUE + buttocks + B feet. Total A w/c transport to and from gym 2/2 time management + energy conservation. Stand pivot to and from mat table with min A to power up and increased time to complete. Pt benefits from external cuing of "$RemoveB'1 2 3 '[id number]$ up." Completed 1x15 of RUE biceps curls, cross  body reach, and BLE knee extension against level 2 resistive theraband. Wife interested in receiving HEP, will provide next session. Stood for 6 min to match playing cards with R hand, close S to CGA for balance. Req min Vcs to accurately match all cards. Stand-pivot with use of bed rail back to bed, total A to doff B shoes + L AFO, and returned to supine mod A to progress BLE onto bed. PROM completed on LUE for 4x each for  10 sec holds at each joints.  Pt left semi-reclined in bed with bed alarm engaged, call bell in reach, and all immediate needs met.     Session 2 [phone]): Pt received semi-reclined in bed with wife present, no c/o pain, agreeable to therapy. Session focus on self-care retraining, activity tolerance, RUE + BLE strengthening in prep for improved ADL/func mobility performance + decreased caregiver burden. Wife reports that his L AFO is currently with AFO consultant to have toe cap replaced, elected to stay in room to complete therapy session. In supine, completed 1x15 bridges, B hip abduction with level 2 theraband, and modified sit-ups against incline. Transitioned to siting EOB with min A to pivot hips and increased time. Completed 1x15 of core twists, R shoulder press, cross body reaches with level 2 theraband. Finally, completed 1x15 of B marches in place and knee extension with 4 lb ankle weight. Provided printout of exercises to complete at home with theraband. Pt and wife with no further questions at this time. Total A to doff/donn B socks, mod A to progress BLE onto bed to return to supine.   Pt left semi-reclined in bed with bed alarm engaged, call bell in reach, and all immediate needs met.     Volanda Napoleon MS, OTR/L  05/27/2021, 12:57 PM

## 2021-05-27 NOTE — Plan of Care (Signed)
  Problem: RH Swallowing Goal: LTG Patient will consume least restrictive diet using compensatory strategies with assistance (SLP) Description: LTG:  Patient will consume least restrictive diet using compensatory strategies with assistance (SLP) Outcome: Completed/Met   Problem: RH Expression Communication Goal: LTG Patient will increase word finding of common (SLP) Description: LTG:  Patient will increase word finding of common objects/daily info/abstract thoughts with cues using compensatory strategies (SLP). Outcome: Completed/Met   Problem: RH Problem Solving Goal: LTG Patient will demonstrate problem solving for (SLP) Description: LTG:  Patient will demonstrate problem solving for basic/complex daily situations with cues  (SLP) Outcome: Completed/Met   Problem: RH Memory Goal: LTG Patient will use memory compensatory aids to (SLP) Description: LTG:  Patient will use memory compensatory aids to recall biographical/new, daily complex information with cues (SLP) Outcome: Completed/Met   Problem: RH Attention Goal: LTG Patient will demonstrate this level of attention during functional activites (SLP) Description: LTG:  Patient will will demonstrate this level of attention during functional activites (SLP) Outcome: Completed/Met

## 2021-05-27 NOTE — Progress Notes (Signed)
Orthopedic Tech Progress Note Patient Details:  Robert [name] Nov 12, 1945 [id number]  Called in order to [facility]  for an AFO CONSULT and TOE CAP to be added to AFO  THAT PATIENT HAS/HAD  Patient ID: Robert [name], male   DOB: 1946/04/21, 75 y.o.   MRN: [id number]  Janit Pagan 05/27/2021, 10:33 AM

## 2021-05-28 LAB — GLUCOSE, CAPILLARY: Glucose-Capillary: 119 mg/dL — ABNORMAL HIGH (ref 70–99)

## 2021-05-28 NOTE — Progress Notes (Signed)
Physical Therapy Discharge Summary  Patient Details  Name: Robert [name] MRN: [id number] Date of Birth: December 08, 1945  Today's Date: 05/27/2021 PT Individual Time: 0902-1001  PT Individual Time Calculation (min): 59 min   Patient has met 6 of 7 long term goals due to improved activity tolerance, improved balance, improved postural control, increased strength, and improved coordination.  Patient to discharge at an ambulatory level  CGA  which will be functional for within the home. Pt is appropriate for Total A w/c mobility for longer distances.   Patient's care partner is independent and also requires some assistance to provide the necessary physical assistance at discharge.  Reasons goals not met: Bed mobility goal is appropriate for discharge as pt's wife has been able to provide adequate physical assist for pt throughout his hospitalization.  Recommendation:  Patient will benefit from ongoing skilled PT services in home health setting initially to continue to advance safe functional mobility, address ongoing impairments in strength, coordination, balance, activity tolerance, cognition, safety awareness, and to minimize fall risk. Expectations for upcoming return to OPPT with ability to transfer to car with close supervision/ CGA and consistently complete full SPVT transfers with close supervision.  Equipment: No equipment provided -- pt already owns all necessary equipment  Reasons for discharge: treatment goals met and discharge from hospital  Patient/family agrees with progress made and goals achieved: Yes  Treatment Session PT instructed pt in Grad day assessment to measure progress toward goals. See below for details.  ** Pt has improved to overall CGA/ close supervision for most mobility. Pt prefers to complete most mobility with his own techniques and efforts, please respect this during efforts and educate during rest breaks as to need for potential small, brief amounts of Min A to  complete if pt is freezing or demonstrating difficulty d/t poor foot position/ WBOS. Pt self cues for STS with " 1, 2, 3, UP". On d/c required this for initial STS and then was able to complete 5 additional STS transfers continuously within 38sec. Initial transfer added 10 more sec. TUG completed on d/c at 8min 13sec with CGA using HW.   On d/c day was also able to fully complete standing pivot transfers from seat to seat throughout entire session with request and education required prior to first attempt. Prosthetist available for AFO assessment on d/c day on recommendation for toe cap from this therapist. Prosthetist recommending option for adjustment to ankle flexion angle to AFO. Pt may or may not tolerate. Please observe for any issues and pass recommendation for continued f/u from Brooke Pace at [place] on progression to OPPT services.   PT Discharge Precautions/Restrictions Precautions Precautions: Fall Precaution Comments: L neck incision, hx of L hemiparesis, Parkinson's Disease Required Braces or Orthoses: Other Brace Other Brace: AFO LLE Restrictions Weight Bearing Restrictions: No Pain Pain Assessment Pain Scale: 0-10 Pain Score: 0-No pain Vision/Perception  Vision - Assessment Eye Alignment: Within Functional Limits Ocular Range of Motion: Within Functional Limits Tracking/Visual Pursuits: Able to track stimulus in all quads without difficulty Perception Perception: Impaired Inattention/Neglect: Other (comment) Comments: mild inattention to LUE/ LLE Praxis Praxis: Intact  Cognition Overall Cognitive Status: Impaired/Different from baseline Arousal/Alertness: Awake/alert Orientation Level: Oriented X4 Attention: Sustained Sustained Attention: Impaired Memory Impairment: Decreased recall of new information;Decreased short term memory Awareness: Impaired Problem Solving: Impaired (intact but delayed) Initiating: Impaired Initiating Impairment: Functional  basic Safety/Judgment: Impaired Sensation Sensation Light Touch: Impaired by gross assessment Peripheral sensation comments: decreased sensation to LLE as compared to RLE Coordination  Gross Motor Movements are Fluid and Coordinated: No Fine Motor Movements are Fluid and Coordinated: No Coordination and Movement Description: Rigid movement, left hemiplegia, freezing at times Heel Shin Test: unable to complete Motor  Motor Motor: Hemiplegia;Abnormal tone Motor - Discharge Observations: Dense left hemiplegia, increased tension left elbow/hand.  Mobility Bed Mobility Bed Mobility: Supine to Sit;Sit to Supine;Rolling Right;Rolling Left Rolling Right: Total Assistance - Patient < 25% Rolling Left: Maximal Assistance - Patient 25-49% Supine to Sit: Minimal Assistance - Patient > 75% Sit to Supine: Moderate Assistance - Patient 50-74% Transfers Transfers: Sit to Stand;Stand to Sit;Stand Pivot Transfers Sit to Stand: Minimal Assistance - Patient > 75%;Contact Guard/Touching assist Stand to Sit: Contact Guard/Touching assist;Minimal Assistance - Patient > 75% Stand Pivot Transfers: Contact Guard/Touching assist Stand Pivot Transfer Details (indicate cue type and reason): increased time to initiate movement, intermittent need for cueing sequence/ technique, consistent decrease in LOA required since eval with improved pivot stepping to complete safe turn to seat Transfer via Lift Equipment: Stedy (Has STEDY at home) Locomotion  Gait Ambulation: Yes Gait Assistance: Contact Guard/Touching assist Gait Distance (Feet): 70 Feet Assistive device: Hemi-walker Gait Assistance Details: Verbal cues for gait pattern Gait Assistance Details: vc provided for BIG steps/ leading with knee flexion/ increasing knee flexion Gait Gait: Yes Gait Pattern: Poor foot clearance - left;Trunk flexed;Left flexed knee in stance;Right flexed knee in stance (partial step through that has improved from eval,  significant miprovement in freezing/ festinating) Gait velocity: decreased High Level Ambulation High Level Ambulation: Side stepping Side Stepping: decreased step length with attempt at lateral stepping Stairs / Additional Locomotion Stairs: No Wheelchair Mobility Wheelchair Mobility: No  Trunk/Postural Assessment  Cervical Assessment Cervical Assessment: Exceptions to [facility] (restricted ROM R>L, Forward head) Thoracic Assessment Thoracic Assessment: Within Functional Limits (rounded shoulders) Thoracic AROM Overall Thoracic AROM: Due to premorid status Lumbar Assessment Lumbar Assessment: Exceptions to [facility] (posterior pelvic tilt) Lumbar AROM Overall Lumbar AROM: Deficits;Due to premorid status Postural Control Postural Control: Deficits on evaluation Trunk Control: Inactivity Left trunk, stiffness throughout trunk Postural Limitations: Limited ability to flex forward for transitional movements - sit to stand, scooting, squatting  Balance Balance Balance Assessed: Yes Static Sitting Balance Static Sitting - Level of Assistance: 5: Stand by assistance Dynamic Sitting Balance Dynamic Sitting - Balance Support: Feet supported;Right upper extremity supported Dynamic Sitting - Level of Assistance: 5: Stand by assistance Static Standing Balance Static Standing - Balance Support: Right upper extremity supported;During functional activity Static Standing - Level of Assistance: 5: Stand by assistance Static Standing - Comment/# of Minutes: close supervision to CGA Dynamic Standing Balance Dynamic Standing - Balance Support: Right upper extremity supported;During functional activity Dynamic Standing - Level of Assistance: 5: Stand by assistance Dynamic Standing - Balance Activities: Reaching for objects;Reaching for weighted objects;Reaching across midline Dynamic Standing - Comments: CGA Extremity Assessment      RLE Assessment Active Range of Motion (AROM) Comments: WFL tight  hamstrings General Strength Comments: hip flexion 4-/5, knee flexion 4-/5, knee extension 4+/5, ankle DF 4+/5 LLE Assessment LLE Assessment: Exceptions to [facility] Active Range of Motion (AROM) Comments: limited hip flexion, ankle DF/PF and hip abduction/extension due to weakness, knee extension d/t weakness and HS tightness General Strength Comments: hip flexion 3-/5, knee flexion 3-/5, knee extension 4-/5, ankle DF did not activate antigravity    [name] 05/28/2021, 3:07 AM

## 2021-05-28 NOTE — Progress Notes (Signed)
PROGRESS NOTE   Subjective/Complaints: Received lether toe cap for shoe   No issues overnite, wife is asking about a "benign cyst" removal left post neck area, has been "scanned" by [place].  We discussed rec fo rno elective surgery x 6 mo frpm CVA  ROS: Left arm feels looser, no CP, no SOB , neg N/V/D    Objective:   No results found. Recent Labs    05/26/21 0635  WBC 6.7  HGB 14.0  HCT 42.9  PLT 313     Recent Labs    05/26/21 0635  NA 137  K 4.7  CL 104  CO2 25  GLUCOSE 123*  BUN 11  CREATININE 0.98  CALCIUM 9.1      Intake/Output Summary (Last 24 hours) at 05/28/2021 0818 Last data filed at 05/28/2021 0700 Gross per 24 hour  Intake 360 ml  Output 775 ml  Net -415 ml          Physical Exam: Vital Signs Blood pressure (!) 151/81, pulse 87, temperature 98.3 F (36.8 C), temperature source Oral, resp. rate 17, height '6\' 2"'$  (1.88 m), weight 102.9 kg, SpO2 100 %.   General: No acute distress Mood and affect are appropriate Heart: Regular rate and rhythm no rubs murmurs or extra sounds Lungs: Clear to auscultation, breathing unlabored, no rales or wheezes Abdomen: Positive bowel sounds, soft nontender to palpation, nondistended Extremities: No clubbing, cyanosis, or edema Skin: No evidence of breakdown, no evidence of rash Neurologic: HOH, a little slow to process, word finding deficits. Masked facies. Speech dysarthric. Cranial nerves II through XII intact, motor strength is 5/5 in right and 3-/5 left deltoid, bicep, tricep,  hip flexor, knee extensors, ankle dorsiflexor and plantar flexor TONE  LUE  Pec MAS3 Bicep MAS 2 Wrist flex MAS 2 Finger flex MAS 3  Has good grasp but limited release due to increased Left finger and wrist flexor tone    Assessment/Plan: 1. Functional deficits due to CVA, debility  Stable for D/C today F/u PCP in 3-4 weeks F/u PM&R 2 weeks See D/C summary See D/C  instructions  Care Tool:  Bathing    Body parts bathed by patient: Left arm, Chest, Abdomen, Front perineal area, Right upper leg, Left upper leg, Face   Body parts bathed by helper: Buttocks, Left lower leg, Right lower leg, Right arm     Bathing assist Assist Level: Minimal Assistance - Patient > 75%     Upper Body Dressing/Undressing Upper body dressing   What is the patient wearing?: Pull over shirt    Upper body assist Assist Level: Moderate Assistance - Patient 50 - 74%    Lower Body Dressing/Undressing Lower body dressing      What is the patient wearing?: Pants     Lower body assist Assist for lower body dressing: Maximal Assistance - Patient 25 - 49%     Toileting Toileting    Toileting assist Assist for toileting: Total Assistance - Patient < 25%     Transfers Chair/bed transfer  Transfers assist     Chair/bed transfer assist level: Contact Guard/Touching assist     Locomotion Ambulation   Ambulation assist  Assist level: Contact Guard/Touching assist Assistive device: Walker-hemi (LLE AFO) Max distance: 38f   Walk 10 feet activity   Assist  Walk 10 feet activity did not occur: Safety/medical concerns  Assist level: Contact Guard/Touching assist Assistive device: Walker-hemi, Orthosis   Walk 50 feet activity   Assist Walk 50 feet with 2 turns activity did not occur: Safety/medical concerns  Assist level: Contact Guard/Touching assist Assistive device: Walker-hemi, Orthosis    Walk 150 feet activity   Assist Walk 150 feet activity did not occur: Safety/medical concerns         Walk 10 feet on uneven surface  activity   Assist Walk 10 feet on uneven surfaces activity did not occur: Safety/medical concerns         Wheelchair     Assist Will patient use wheelchair at discharge?: No   Wheelchair activity did not occur: N/A         Wheelchair 50 feet with 2 turns activity    Assist    Wheelchair  50 feet with 2 turns activity did not occur: N/A       Wheelchair 150 feet activity     Assist  Wheelchair 150 feet activity did not occur: N/A       Blood pressure (!) 151/81, pulse 87, temperature 98.3 F (36.8 C), temperature source Oral, resp. rate 17, height '6\' 2"'$  (1.88 m), weight 102.9 kg, SpO2 100 %.  Medical Problem List and Plan: 1.  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  History of recurrent DVT/antithrombotics: -DVT/anticoagulation:  Pharmaceutical: Other (comment)--on Eliquis--will be on Eliquis lifelong after d/w Neuro and IM.              -antiplatelet therapy: DAPT 3. Pain Management: N/A 4. Mood: LCSW to follow for evaluation and support             -antipsychotic agents: On Pimavanserin daily 5. Neuropsych: This patient may not be fully capable of making decisions on his own behalf. 6. Skin/Wound Care: Treat/monitor MASD on sacrum and continue pressure relief measures.Also has 2 small skin tears- continue desitin              -- Monitor nutritional status and offer supplements as needed Po intake.   --added Juven twice daily to promote wound healing. 7. Fluids/Electrolytes/Nutrition: Monitor intake/output.  some pedal edema , will give lasix x 2 d 8.  T2DM: Resume metformin as > 48 hours since dye procedure. -- Will monitor blood sugars AC at bedtime.  Use SSI for elevated blood sugars as needed CBG (last 3)  Recent Labs    05/27/21 1645 05/27/21 2106 05/28/21 0532  GLUCAP 132* 114* 119*   Controlled 8/3  9.  Parkinson's disease: Tends to freeze per reports. On Azilect. Has slightly masked facies.  -- Continue Sinemet CR per home regimen--2.5 tabs BID during the day and one tap at nights. 10.  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 HLouis Meckel --[facility]  History of CVA with left hemiplegia/dysphagia:  Continue DAPT and statin. 12.  HTN: controlled 8/1- monitor  on diuretic, may stop flomax Vitals:   05/27/21 1900 05/28/21 0500  BP: 116/61 (!) 151/81  Pulse: 66 87  Resp: 17 17  Temp: (!) 97.5 F (36.4 C) 98.3 F (36.8 C)  SpO2: 98%  100%    13. Parkinson's Dementia?: Continue Namenda and Pimavanderin (Nuplazid) for mood stabilization.  14. Impacted cerumen in ears- start Debrox drops x4 days both ears 15. Spasticity- Left Pec 50U, Biceps 100U, FCR 50, FDS 50 Xeomin should take effect later this week  16. Ingrown toenail: please schedule for outpatient podiatry follow-up.  17. Bleeding gums: resolved. Advised wife she can purchase oragel outpatient if returns and is painful.  18. Wax in ears may use bulb syringe , LOS: 13 days A FACE TO FACE EVALUATION WAS PERFORMED  Charlett Blake 05/28/2021, 8:18 AM

## 2021-05-28 NOTE — Plan of Care (Signed)
  Problem: RH Bed Mobility Goal: LTG Patient will perform bed mobility with assist (PT) Description: LTG: Patient will perform bed mobility with assistance, with/without cues (PT). 05/28/2021 0317 by [name], PT Outcome: Adequate for Discharge Flowsheets (Taken 05/27/2021 1804) LTG: Pt will perform bed mobility with assistance level of: Moderate Assistance - Patient 50 - 74% Note: Pt's wife has caregiver to assist with ADLs and mobility. Pt's wife has also shown ability to assist with all mobility throughout hospitalization. 05/28/2021 0311 by [name], PT Outcome: Adequate for Discharge Flowsheets (Taken 05/27/2021 1804) LTG: Pt will perform bed mobility with assistance level of: Moderate Assistance - Patient 50 - 74%   Problem: RH Balance Goal: LTG Patient will maintain dynamic sitting balance (PT) Description: LTG:  Patient will maintain dynamic sitting balance with assistance during mobility activities (PT) Outcome: Completed/Met Flowsheets (Taken 05/16/2021 1725 by Max Sane, PT) LTG: Pt will maintain dynamic sitting balance during mobility activities with:: Supervision/Verbal cueing Goal: LTG Patient will maintain dynamic standing balance (PT) Description: LTG:  Patient will maintain dynamic standing balance with assistance during mobility activities (PT) Outcome: Completed/Met Flowsheets (Taken 05/27/2021 1804) LTG: Pt will maintain dynamic standing balance during mobility activities with:: Minimal Assistance - Patient > 75%   Problem: Sit to Stand Goal: LTG:  Patient will perform sit to stand with assistance level (PT) Description: LTG:  Patient will perform sit to stand with assistance level (PT) Outcome: Completed/Met Flowsheets (Taken 05/27/2021 1804) LTG: PT will perform sit to stand in preparation for functional mobility with assistance level:  Contact Guard/Touching assist  Minimal Assistance - Patient > 75%   Problem: RH Bed to Chair Transfers Goal: LTG Patient  will perform bed/chair transfers w/assist (PT) Description: LTG: Patient will perform bed to chair transfers with assistance (PT). Outcome: Completed/Met Flowsheets (Taken 05/27/2021 1804) LTG: Pt will perform Bed to Chair Transfers with assistance level: Contact Guard/Touching assist   Problem: RH Car Transfers Goal: LTG Patient will perform car transfers with assist (PT) Description: LTG: Patient will perform car transfers with assistance (PT). Outcome: Completed/Met Flowsheets (Taken 05/27/2021 1804) LTG: Pt will perform car transfers with assist:: Minimal Assistance - Patient > 75%   Problem: RH Ambulation Goal: LTG Patient will ambulate in controlled environment (PT) Description: LTG: Patient will ambulate in a controlled environment, # of feet with assistance (PT). 05/28/2021 0317 by [name], PT Outcome: Completed/Met 05/28/2021 0311 by [name], PT Flowsheets (Taken 05/27/2021 1804) LTG: Pt will ambulate in controlled environ  assist needed:: Contact Guard/Touching assist LTG: Ambulation distance in controlled environment: 67' with HW Goal: LTG Patient will ambulate in home environment (PT) Description: LTG: Patient will ambulate in home environment, # of feet with assistance (PT). Outcome: Completed/Met Flowsheets Taken 05/27/2021 1804 by [name], PT LTG: Pt will ambulate in home environ  assist needed:: Contact Guard/Touching assist Taken 05/16/2021 1725 by Max Sane, PT LTG: Ambulation distance in home environment: 25' w/ [facility]

## 2021-05-28 NOTE — Progress Notes (Signed)
Inpatient Rehabilitation Care Coordinator Discharge Note  The overall goal for the admission was met for:   Discharge location: Yes, home   Length of Stay: Yes, 13 Days  Discharge activity level: Yes  Home/community participation: Yes  Services provided included: MD, RD, PT, OT, SLP, RN, CM, TR, Pharmacy, and SW  Financial Services: Medicare  Choices offered to/list presented UQ:XAFHSVE spouse [name])  Follow-up services arranged: Home Health: [place] (or additional information): PT OT [facility] [name]) [facility] Transfer   Patient/Family verbalized understanding of follow-up arrangements: Yes  Individual responsible for coordination of the follow-up plan: [name] [phone]  Confirmed correct DME delivered: Dyanne Iha 05/28/2021    Dyanne Iha

## 2021-05-28 NOTE — Progress Notes (Signed)
Patient discharged home via ambulance this afternoon at 1100. Information packet given to patient and family per PA, no questions noted. Taken down via stretcher. Angie Fava

## 2021-05-28 NOTE — Discharge Summary (Signed)
Physician Discharge Summary  Patient ID: Robert [name] MRN: [id number] DOB/AGE: 11-14-1945 75 y.o.  Admit date: 05/15/2021 Discharge date: 05/28/2021  Discharge Diagnoses:  Principal Problem:   Embolic stroke [facility]) Active Problems:   H/O: CVA (cerebrovascular accident)   Gait abnormality   Hemiparesis affecting left side as late effect of cerebrovascular accident ([place])   Spasticity as late effect of cerebrovascular accident (CVA)   Discharged Condition: good  Significant Diagnostic Studies: VAS [place] LOWER EXTREMITY VENOUS (DVT)  Result Date: 05/15/2021  Lower Venous DVT Study Patient Name:  PHINN [name]  Date of Exam:   05/14/2021 Medical Rec #: [id number]       Accession #:    [id number] Date of Birth: [birth year]       Patient Gender: M Patient Age:   74Y Exam Location:  [facility] Procedure:      VAS [place] LOWER EXTREMITY VENOUS (DVT) Referring Phys: [id number] PRAMOD S SETHI --------------------------------------------------------------------------------  Indications: Superficial venous thrombosis.  Limitations: Poor ultrasound/tissue interface. Comparison Study: 08/15/19 prior Performing Technologist: Archie Patten RVS  Examination Guidelines: A complete evaluation includes B-mode imaging, spectral Doppler, color Doppler, and power Doppler as needed of all accessible portions of each vessel. Bilateral testing is considered an integral part of a complete examination. Limited examinations for reoccurring indications may be performed as noted. The reflux portion of the exam is performed with the patient in reverse Trendelenburg.  +---------+---------------+---------+-----------+----------+--------------+ RIGHT    CompressibilityPhasicitySpontaneityPropertiesThrombus Aging +---------+---------------+---------+-----------+----------+--------------+ CFV      Full           Yes      Yes                                  +---------+---------------+---------+-----------+----------+--------------+ SFJ      Full                                                        +---------+---------------+---------+-----------+----------+--------------+ FV Prox  Full                                                        +---------+---------------+---------+-----------+----------+--------------+ FV Mid   Full                                                        +---------+---------------+---------+-----------+----------+--------------+ FV DistalFull                                                        +---------+---------------+---------+-----------+----------+--------------+ PFV      Full                                                        +---------+---------------+---------+-----------+----------+--------------+  POP      Full           Yes      Yes                                 +---------+---------------+---------+-----------+----------+--------------+ PTV      Full                                                        +---------+---------------+---------+-----------+----------+--------------+ PERO     Full                                                        +---------+---------------+---------+-----------+----------+--------------+   +---------+---------------+---------+-----------+----------+--------------+ LEFT     CompressibilityPhasicitySpontaneityPropertiesThrombus Aging +---------+---------------+---------+-----------+----------+--------------+ CFV      Full           Yes      Yes                                 +---------+---------------+---------+-----------+----------+--------------+ SFJ      Full                                                        +---------+---------------+---------+-----------+----------+--------------+ FV Prox  Full                                                         +---------+---------------+---------+-----------+----------+--------------+ FV Mid                  Yes      Yes                                 +---------+---------------+---------+-----------+----------+--------------+ FV Distal               Yes                                          +---------+---------------+---------+-----------+----------+--------------+ PFV      Full                                                        +---------+---------------+---------+-----------+----------+--------------+ POP      Full           Yes      Yes                                 +---------+---------------+---------+-----------+----------+--------------+  PTV      Full                                                        +---------+---------------+---------+-----------+----------+--------------+ PERO     Full                                                        +---------+---------------+---------+-----------+----------+--------------+     Summary: BILATERAL: - No evidence of deep vein thrombosis seen in the lower extremities, bilaterally. -No evidence of popliteal cyst, bilaterally.   *See table(s) above for measurements and observations. Electronically signed by Monica Martinez MD on 05/15/2021 at 2:26:34 PM.    Final    Structural Heart Procedure  Result Date: 05/12/2021 See surgical note for result.  CT ANGIO NECK CODE STROKE  Result Date: 05/12/2021 CLINICAL DATA:  Right-sided weakness.  Same day carotid surgery. EXAM: CT ANGIOGRAPHY HEAD AND NECK TECHNIQUE: Multidetector CT imaging of the head and neck was performed using the standard protocol during bolus administration of intravenous contrast. Multiplanar CT image reconstructions and MIPs were obtained to evaluate the vascular anatomy. Carotid stenosis measurements (when applicable) are obtained utilizing NASCET criteria, using the distal internal carotid diameter as the denominator. CONTRAST:  59m OMNIPAQUE  IOHEXOL 350 MG/ML SOLN COMPARISON:  None. FINDINGS: CTA NECK FINDINGS SKELETON: There is no bony spinal canal stenosis. No lytic or blastic lesion. OTHER NECK: There is gas tracking superiorly along the course of the left sternocleidomastoid muscle. Question recent central venous catheter placement attempt. UPPER CHEST: No pneumothorax or pleural effusion. No nodules or masses. AORTIC ARCH: There is no calcific atherosclerosis of the aortic arch. There is no aneurysm, dissection or hemodynamically significant stenosis of the visualized portion of the aorta. Conventional 3 vessel aortic branching pattern. The visualized proximal subclavian arteries are widely patent. RIGHT CAROTID SYSTEM: Right internal carotid artery is occluded at its origin. The right external carotid arteries normal. LEFT CAROTID SYSTEM: Patent left ICA stent VERTEBRAL ARTERIES: Left dominant configuration. Both origins are clearly patent. There is no dissection, occlusion or flow-limiting stenosis to the skull base (V1-V3 segments). CTA HEAD FINDINGS POSTERIOR CIRCULATION: --Vertebral arteries: Normal V4 segments. --Inferior cerebellar arteries: Normal. --Basilar artery: Normal. --Superior cerebellar arteries: Normal. --Posterior cerebral arteries (PCA): Normal. ANTERIOR CIRCULATION: --Intracranial internal carotid arteries: Reconstitution of the right ICA at the skull base due to collateral flow. Left ICA is patent. --Anterior cerebral arteries (ACA): Normal. Both A1 segments are present. Patent anterior communicating artery (a-comm). --Middle cerebral arteries (MCA): There is suspected occlusion of a left MCA M2 branch (series 8, image 147) at a branch point in the sylvian fissure. There is also high-grade stenosis at the origin of the left M2 branch just beyond the bifurcation. There is pruning of the right MCA branches in the context of old infarct. VENOUS SINUSES: As permitted by contrast timing, patent. ANATOMIC VARIANTS: None Review of  the MIP images confirms the above findings. IMPRESSION: 1. Suspected occlusion of a left MCA M2 branch at a branch point in the Sylvian fissure. These results were called by telephone at the time of interpretation on 05/12/2021 at 7:45 pm to provider [facility]  who verbally acknowledged these results. 2. 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 3. Patent left ICA stent. 4. Gas tracking superiorly along the course of the left sternocleidomastoid muscle, possibly recent central venous catheter placement attempt. Electronically Signed   By: Ulyses Jarred M.D.   On: 05/12/2021 19:54   HYBRID OR IMAGING ([facility] ONLY)  Result Date: 05/12/2021 There is no interpretation for this exam.  This order is for images obtained during a surgical procedure.  Please See "Surgeries" Tab for more information regarding the procedure.    Labs:  Basic Metabolic Panel: BMP Latest Ref Rng & Units 05/26/2021 05/19/2021 05/16/2021  Glucose 70 - 99 mg/dL 123(H) 150(H) 146(H)  BUN 8 - 23 mg/dL '11 13 12  '$ Creatinine 0.61 - 1.24 mg/dL 0.98 0.98 0.97  Sodium 135 - 145 mmol/L 137 134(L) 136  Potassium 3.5 - 5.1 mmol/L 4.7 4.1 3.9  Chloride 98 - 111 mmol/L 104 99 104  CO2 22 - 32 mmol/L '25 27 25  '$ Calcium 8.9 - 10.3 mg/dL 9.1 9.2 9.0     CBC: CBC Latest Ref Rng & Units 05/26/2021 05/19/2021 05/16/2021  WBC 4.0 - 10.5 K/uL 6.7 7.4 6.5  Hemoglobin 13.0 - 17.0 g/dL 14.0 14.4 13.3  Hematocrit 39.0 - 52.0 % 42.9 44.9 41.7  Platelets 150 - 400 K/uL 313 313 269     CBG: Recent Labs  Lab 05/28/21 0532  GLUCAP 119*    Brief HPI:   Obeth Lenhard is a 75 y.o. 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 Carlis Abbott. On evening post procedure, he was found to have confusion with  right sided weakness, left gaze preference and speech deficits. MRI brain done revealing scattered embolic infarcts in left MCA territory.  Dr. Leonie Man felt stroke likely complication of TCAR and to continue DAPT with permissive hypertension and liberalize blood pressure up to 170.  Patient's verbal output was improving, right-sided weakness had resolved but he continued to have expressive deficits with right inattention.  Therapy evaluations revealed functional decline and CIR was recommended for follow-up therapy   Hospital Course: Rashawd Hildebrandt was admitted to rehab 05/15/2021 for inpatient therapies to consist of PT, ST and OT at least three hours five days a week. Past admission physiatrist, therapy team and rehab RN have worked together to provide customized collaborative inpatient rehab.Blood pressures were monitored on TID basis and is showing improvement. He has had issues with BLE edema which was treated briefly with diuretic.   His diabetes has been monitored with ac/hs CBG checks and SSI was use prn for tighter BS control.  Metformin was resumed with improvement in blood sugar control. Neck incision is healing well without s/s of infection.  Juven was added to help promote wound healing. Serial check of lytes was WNL.  Left  AFO was ordered with toe cap to help with gait.  Bleeding of gums has resolved with oral care and debrox was used to help with impacted cerumen. Wife to set up appointment with ENT for ear lavage after discharge. He has made progress during his stay and currently requires CGA to min assist for most mobility tasks depending of freezing episodes. He will continue to receive follow up HHPT, OT and ST by [facility] aide by [facility] after  discharge.    Rehab course: During patient's stay in rehab weekly team conferences were held to monitor patient's progress, set goals and discuss barriers to discharge. At admission, patient required total assist for basic  ADL tasks and max assist for mobility. He exhibited anomia with intermittent word finding deficits and expressive deficits. He  has had improvement in activity tolerance, balance, postural control as well as ability to compensate for deficits.  He required max assist overall to complete ADL tasks.  He required contact-guard assist for transfers with use of steady equipment.  He was able to ambulate 70 feet with contact-guard assist with hemiwalker and cues for gait pattern.  He requires min assist for basic cognitive tasks and mod assist for high-level complex tasks.  Status is advanced to dysphagia 3, thin liquids however was downgraded to dysphagia 1 for comfort.  Wife was educated on different textures to help with diet advancement per tolerance.  Patient's cognition was at baseline and no follow-up speech therapy was needed after discharge. Wife has been supportive and present for most of his stay. Family education completed regarding all aspects of care and safety.     Disposition: Home  Diet: Dysphagia 1 or as tolerated  Special Instructions: Perform Foley care twice daily Discharge Instructions     Ambulatory referral to Physical Medicine Rehab   Complete by: As directed    3-4 week follow up appt      Allergies as of 05/28/2021       Reactions   Propofol Other (See Comments)   "hiccups for weeks"   Lisinopril Cough        Medication List     STOP taking these medications    tamsulosin 0.4 MG Caps capsule Commonly known as: FLOMAX       TAKE these medications    acetaminophen 325 MG tablet Commonly known as: TYLENOL Take 1-2 tablets (325-650 mg total) by mouth every 4 (four) hours as needed for mild pain. What changed:  medication strength how much to take when to take this reasons to take this   apixaban 5 MG Tabs tablet Commonly known as: ELIQUIS Take 1 tablet (5 mg total) by mouth 2 (two) times daily.   aspirin EC 81 MG tablet Take 81 mg by mouth daily.  Swallow whole.   busPIRone 7.5 MG tablet Commonly known as: BUSPAR Take 7.5 mg by mouth 2 (two) times daily.   Carbidopa-Levodopa ER 25-100 MG tablet controlled release Commonly known as: SINEMET CR Take 1-2.5 tablets by mouth See admin instructions. Takes 2.5 tablets in the 9am, 2.5 tablets in the 1 pm, and 1 tablet at 5pm   clopidogrel 75 MG tablet Commonly known as: PLAVIX Take 1 tablet (75 mg total) by mouth daily.   hydrOXYzine 10 MG tablet Commonly known as: ATARAX/VISTARIL Take 10 mg by mouth at bedtime as needed for sleep or itching.   lidocaine 5 % Commonly known as: Lidoderm Place 1 patch onto the skin daily as needed (pain). Remove & Discard patch within 12 hours or as directed by MD   loratadine 10 MG tablet Commonly known as: CLARITIN Take 10 mg by mouth daily as needed for allergies.   memantine 10 MG tablet Commonly known as: NAMENDA Take 20 mg by mouth at bedtime.   metFORMIN 500 MG tablet Commonly known as: GLUCOPHAGE Take 500 mg by mouth at bedtime.   Nuplazid 34 MG Caps Generic drug: Pimavanserin Tartrate Take 34 mg by mouth daily.   polyethylene  glycol 17 g packet Commonly known as: MiraLax Take 17 g by mouth daily. What changed:  when to take this reasons to take this   rasagiline 1 MG Tabs tablet Commonly known as: AZILECT Take 1 mg by mouth daily.   simvastatin 20 MG tablet Commonly known as: ZOCOR Take 20 mg by mouth every evening.        Follow-up Information     Kirsteins, Luanna Salk, MD Follow up.   Specialty: Physical Medicine and Rehabilitation Why: office will call you with follow up appointment Contact information: [place] [address] [phone]         [name], MD. Call.   Specialty: Family Medicine Why: for post hospital follow up/[facility] Contact information: [place] [address] [phone]         [name], MD  Follow up.   Specialty: Vascular Surgery Contact information: [place] [address] [phone]                 Signed: Bary Leriche 06/04/2021, 1:24 AM

## 2021-05-29 ENCOUNTER — Other Ambulatory Visit: Payer: Self-pay | Admitting: *Deleted

## 2021-05-29 NOTE — Patient Outreach (Signed)
[place] [facility]) Care Management  [place]  05/29/2021   Robert [name] Feb 11, 1946 [id number]   Noted that member was discharged home from [place] on 8/3.  Call placed to wife, report member is improving. He will start with home health PT/OT/ST tomorrow through [facility], hoping to have member improve enough to restart outpatient PT.  Also has the help of Robert [name] for aide services.  Denies any urgent concerns, encouraged to contact this care manager with questions.    Encounter Medications:  Outpatient Encounter Medications as of 05/29/2021  Medication Sig   acetaminophen (TYLENOL) 325 MG tablet Take 1-2 tablets (325-650 mg total) by mouth every 4 (four) hours as needed for mild pain.   apixaban (ELIQUIS) 5 MG TABS tablet Take 1 tablet (5 mg total) by mouth 2 (two) times daily.   aspirin EC 81 MG tablet Take 81 mg by mouth daily. Swallow whole.   busPIRone (BUSPAR) 7.5 MG tablet Take 7.5 mg by mouth 2 (two) times daily.   Carbidopa-Levodopa ER (SINEMET CR) 25-100 MG tablet controlled release Take 1-2.5 tablets by mouth See admin instructions. Takes 2.5 tablets in the 9am, 2.5 tablets in the 1 pm, and 1 tablet at 5pm   clopidogrel (PLAVIX) 75 MG tablet Take 1 tablet (75 mg total) by mouth daily.   hydrOXYzine (ATARAX/VISTARIL) 10 MG tablet Take 10 mg by mouth at bedtime as needed for sleep or itching.   lidocaine (LIDODERM) 5 % Place 1 patch onto the skin daily as needed (pain). Remove & Discard patch within 12 hours or as directed by MD   loratadine (CLARITIN) 10 MG tablet Take 10 mg by mouth daily as needed for allergies.   memantine (NAMENDA) 10 MG tablet Take 20 mg by mouth at bedtime.   metFORMIN (GLUCOPHAGE) 500 MG tablet Take 500 mg by mouth at bedtime.   Pimavanserin Tartrate (NUPLAZID) 34 MG CAPS Take 34 mg by mouth daily.   polyethylene glycol (MIRALAX) 17 g packet Take 17 g by mouth daily.   rasagiline (AZILECT) 1 MG TABS tablet Take 1 mg by mouth  daily.   simvastatin (ZOCOR) 20 MG tablet Take 20 mg by mouth every evening.   No facility-administered encounter medications on file as of 05/29/2021.    Functional Status:  In your present state of health, do you have any difficulty performing the following activities: 05/15/2021 05/08/2021  Hearing? Robert [name]  Vision? N N  Difficulty concentrating or making decisions? Robert [name]  Walking or climbing stairs? Y Y  Comment - -  Dressing or bathing? Y Y  Doing errands, shopping? Y Y  Some recent data might be hidden    Fall/Depression Screening: Fall Risk  02/19/2021 06/18/2020 04/08/2020  Falls in the past year? 0 0 0  Number falls in past yr: 0 0 0  Injury with Fall? 0 0 0  Risk for fall due to : - Impaired balance/gait;Impaired mobility;Impaired vision;Medication side effect Medication side effect;Impaired balance/gait;Impaired mobility;Impaired vision;Mental status change  Follow up - Falls evaluation completed;Education provided;Falls prevention discussed Falls evaluation completed;Education provided;Falls prevention discussed   PHQ 2/9 Scores 02/19/2021 02/20/2020 09/12/2019  Exception Documentation Other- indicate reason in comment box Other- indicate reason in comment box Other- indicate reason in comment box  Not completed - did not speak with patient, spoke with wife Assessment completed by wife    Assessment:   Care Plan Care Plan : General Plan of Care (Adult)  Updates made by Robert David, RN since 05/29/2021  12:00 AM     Problem: Quality of Life (General Plan of Care)      Long-Range Goal: Quality of Life Maintained   Start Date: 02/19/2021  Expected End Date: 08/29/2021  This Visit's Progress: Not on track  Recent Progress: On track  Priority: High  Note:   Assessed patient's thoughts about quality of life, goals and expectations, and dissatisfaction or desire to improve.   Identified issues of primary importance such as mental health, illness, exercise tolerance, pain,  cognitive change, social isolation, finances and relationships.   5/4 - Confirmed member has follow up appointment with PCP in effort to manage chronic medical conditions.    8/4 - Date extended, patient recently discharged from hospital    Task: Support and Maintain Acceptable Degree of Health, Comfort and Happiness   Due Date: 08/29/2021  Note:   Care Management Activities:    - community involvement promoted - self-expression encouraged - social relationships promoted - wellness behaviors promoted    Notes:       Goals Addressed             This Visit's Progress    [facility]) Learn More About My Health   On track    Timeframe:  Long-Range Goal Priority:  Medium Start Date:        8/4                   Expected End Date:   12/4 (date reset)                   Barriers: Knowledge    - ask questions - speak up when I don't understand    Why is this important?   The best way to learn about your health and care is by talking to the doctor and nurse.  They will answer your questions and give you information in the way that you like best.    Notes: for wife  4/27 - Confirmed wife's understanding of surgery and sepsis, signs/symptoms of infection  5/4 - Encouraged wife to contact this care manager or provider if any concerns arise  5/17 - Wife verbalizes understanding of recent gall bladder surgery, denies questions.  No signs/symptoms of infection or complications  [id number] - Wife is member's caregiver, has additional help when needed to care for member  6/24 - Per wife, member is now active with PT to help increase strength  8/4 - Member was admitted for carotid procedure, had stroke while hospitalized.  New having to work back up to baseline activity level with help of PT/OT     [facility]) Make and Keep All Appointments   On track    Timeframe:  Short-Term Goal Priority:  High Start Date:           8/4                 Expected End Date:   10/4  Barriers: Other -  Potential Caregiver stress.  Patient disabled, wife is only caregiver                       - ask family or friend for a ride - call to cancel if needed - keep a calendar with appointment dates    Why is this important?   Part of staying healthy is seeing the doctor for follow-up care.  If you forget your appointments, there are some things you can do to stay on track.  Notes:   4/27 - Confirmed member has seen surgeon since having gall bladder removed.  Wife aware of when to contact MD with changes, will continue PCP follow up  5/4 - PCP appointment scheduled for 5/10  5/17 - Confirmed member attended PCP appointment, wife denies any concerns.  5/27 - All appointments up to date, been out of hospital for 30 days, wife will continue to monitor to decrease risk of readmission.  Will follow up with MD as needed to stay stable  6/24 - No follow ups since last outreach, wife has calendar of upcoming appointments  8/4 - Patient recently discharged from [facility].  Has follow up appointment scheduled with PCP, date not provided by wife.  Appointments with vascular on 8/16 and with rehab provider on 9/13.  Wife will provide transportation        Plan:  Follow-up: Patient agrees to Care Plan and Follow-up. Follow-up in 1 month(s).  Robert [name], [place], MSN [place] [phone]

## 2021-06-02 DIAGNOSIS — L03032 Cellulitis of left toe: Secondary | ICD-10-CM | POA: Diagnosis not present

## 2021-06-02 DIAGNOSIS — H6123 Impacted cerumen, bilateral: Secondary | ICD-10-CM | POA: Diagnosis not present

## 2021-06-04 ENCOUNTER — Telehealth: Payer: Self-pay

## 2021-06-04 DIAGNOSIS — N400 Enlarged prostate without lower urinary tract symptoms: Secondary | ICD-10-CM | POA: Diagnosis not present

## 2021-06-04 DIAGNOSIS — E1169 Type 2 diabetes mellitus with other specified complication: Secondary | ICD-10-CM | POA: Diagnosis not present

## 2021-06-04 DIAGNOSIS — I639 Cerebral infarction, unspecified: Secondary | ICD-10-CM | POA: Diagnosis not present

## 2021-06-04 DIAGNOSIS — I10 Essential (primary) hypertension: Secondary | ICD-10-CM | POA: Diagnosis not present

## 2021-06-04 DIAGNOSIS — Z09 Encounter for follow-up examination after completed treatment for conditions other than malignant neoplasm: Secondary | ICD-10-CM | POA: Diagnosis not present

## 2021-06-04 DIAGNOSIS — F418 Other specified anxiety disorders: Secondary | ICD-10-CM | POA: Diagnosis not present

## 2021-06-04 DIAGNOSIS — N312 Flaccid neuropathic bladder, not elsewhere classified: Secondary | ICD-10-CM | POA: Diagnosis not present

## 2021-06-04 DIAGNOSIS — G20 Parkinson's disease: Secondary | ICD-10-CM | POA: Diagnosis not present

## 2021-06-04 DIAGNOSIS — I779 Disorder of arteries and arterioles, unspecified: Secondary | ICD-10-CM | POA: Diagnosis not present

## 2021-06-04 DIAGNOSIS — G8194 Hemiplegia, unspecified affecting left nondominant side: Secondary | ICD-10-CM | POA: Diagnosis not present

## 2021-06-04 DIAGNOSIS — K219 Gastro-esophageal reflux disease without esophagitis: Secondary | ICD-10-CM | POA: Diagnosis not present

## 2021-06-04 DIAGNOSIS — I6930 Unspecified sequelae of cerebral infarction: Secondary | ICD-10-CM | POA: Diagnosis not present

## 2021-06-04 DIAGNOSIS — Z86718 Personal history of other venous thrombosis and embolism: Secondary | ICD-10-CM | POA: Diagnosis not present

## 2021-06-04 NOTE — Telephone Encounter (Signed)
Pt's home health nurse Amber ([facility]) called to let us know pt has some blood in catheter tubing. This is not new for pt, as he had this in hospital per pt's wife but wife is concerned. Amber stated it is light pink in the tubing. Per APP, Dr. Kendall Flack should be made aware. Pt does have f/u in our office next week, as well. Amber verbalized understanding; no further questions/concerns at this time.

## 2021-06-10 ENCOUNTER — Ambulatory Visit (INDEPENDENT_AMBULATORY_CARE_PROVIDER_SITE_OTHER): Payer: Medicare Other | Admitting: Vascular Surgery

## 2021-06-10 ENCOUNTER — Encounter: Payer: Self-pay | Admitting: Vascular Surgery

## 2021-06-10 ENCOUNTER — Ambulatory Visit (HOSPITAL_COMMUNITY)
Admission: RE | Admit: 2021-06-10 | Discharge: 2021-06-10 | Disposition: A | Discharge status: Home / Self Care | Payer: Medicare Other | Source: Ambulatory Visit | Attending: Vascular Surgery | Admitting: Vascular Surgery

## 2021-06-10 ENCOUNTER — Other Ambulatory Visit: Payer: Self-pay

## 2021-06-10 VITALS — BP 128/82 | HR 94 | Temp 97.2°F | Resp 18 | Ht 74.0 in | Wt 225.0 lb

## 2021-06-10 DIAGNOSIS — I6522 Occlusion and stenosis of left carotid artery: Secondary | ICD-10-CM | POA: Diagnosis not present

## 2021-06-10 NOTE — Progress Notes (Signed)
Patient name: Robert [name] MRN: [id number] DOB: 24-Jan-1946 Sex: male  REASON FOR VISIT: Hospital follow-up after left TCAR  HPI: Robert [name] is a 75 y.o. male with multiple medical problems that presents for hospital follow-up after recent left TCAR.  Patient underwent a left TCAR on 05/12/2021 for an asymptomatic high-grade stenosis greater than 80% with a contralateral occlusion and a previous right MCA stroke.  He did well during the procedure and woke up neurologically at his baseline.  Unfortunately on the evening after his TCAR he was noted to have some right-sided weakness and a code stroke was called.  He was found to have some small left MCA and basal ganglia infarct.  Fortunately he recovered quickly and went to rehab.  He is now at home and feels he is close to his neurologic baseline.  He is taking Eliquis aspirin Plavix.  Past Medical History:  Diagnosis Date   Anemia    BPH (benign prostatic hyperplasia)    CVA (cerebral vascular accident) ([facility])    with left sided hemiparesis   Diabetes mellitus without complication ([place])    Diverticulosis    Frequency of urination    GERD (gastroesophageal reflux disease)    Gross hematuria    History of acute pyelonephritis    10-13-2012   History of CVA with residual deficit    2008--  left side of body weakness and foot drop (wears leg brace and uses cane)   History of DVT of lower extremity    2008--  cva   Hypercholesteremia    Hyperlipidemia    Hyperlipidemia    Hypertension    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 2008   Left leg DVT ([place])    Neuromuscular disorder (HCC)    Parkinsons   S/P insertion of IVC (inferior vena caval) filter    2008   Urethral stricture    Urgency of urination    Urinary retention    Weakness of left side of body    secondary to cva 2008   Wears glasses    Wears hearing aid    bilateral-- wears intermittantly    Past Surgical History:  Procedure Laterality Date   CYSTOSCOPY WITH  RETROGRADE URETHROGRAM N/A 10/23/2015   Procedure: CYSTOSCOPY WITH RETROGRADE URETHROGRAM;  Surgeon: [name], MD;  [facility]  Service: Urology;  Laterality: N/A;   CYSTOSCOPY WITH URETHRAL DILATATION N/A 10/23/2015   Procedure: CYSTOSCOPY WITH URETHRAL BALLOON DILATATION;  Surgeon: [name], MD;  [facility];  Service: Urology;  Laterality: N/A;  BALLOON DILATION    IVC FILTER PLACEMENT ([place] HX)  2008   LAPAROSCOPIC CHOLECYSTECTOMY SINGLE PORT N/A 01/09/2021   Procedure: LAPAROSCOPIC CHOLECYSTECTOMY WITH IOC;  Surgeon: [name], MD;  [facility];  Service: General;  Laterality: N/A;  90 MIN   TRANSCAROTID ARTERY REVASCULARIZATION  Left 05/12/2021   Procedure: LEFT TRANSCAROTID ARTERY REVASCULARIZATION;  Surgeon: [name], MD;  Location: [facility] OR;  Service: Vascular;  Laterality: Left;    Family History  Problem Relation Age of Onset   Diabetes Sister    Lung cancer Brother        Post 9/11 voluntary work in [employer]   Prostate [place]    Other Mother        passed away young- non medical   Alzheimer's disease Father    Healthy Son     SOCIAL HISTORY: Social History   Tobacco Use  Smoking status: Former    Years: 10.00    Types: Cigarettes    Quit date: 10/26/1970    Years since quitting: 50.6   Smokeless tobacco: Never  Substance Use Topics   Alcohol use: No    Allergies  Allergen Reactions   Propofol Other (See Comments)    "hiccups for weeks"    Lisinopril Cough    Current Outpatient Medications  Medication Sig Dispense Refill   acetaminophen (TYLENOL) 325 MG tablet Take 1-2 tablets (325-650 mg total) by mouth every 4 (four) hours as needed for mild pain.     apixaban (ELIQUIS) 5 MG TABS tablet Take 1 tablet (5 mg total) by mouth 2 (two) times daily. 60 tablet 0   aspirin EC 81 MG tablet Take 81 mg by mouth daily. Swallow whole.     busPIRone (BUSPAR) 7.5 MG tablet Take 7.5 mg by  mouth 2 (two) times daily.     Carbidopa-Levodopa ER (SINEMET CR) 25-100 MG tablet controlled release Take 1-2.5 tablets by mouth See admin instructions. Takes 2.5 tablets in the 9am, 2.5 tablets in the 1 pm, and 1 tablet at 5pm     clopidogrel (PLAVIX) 75 MG tablet Take 1 tablet (75 mg total) by mouth daily. 30 tablet 0   hydrOXYzine (ATARAX/VISTARIL) 10 MG tablet Take 10 mg by mouth at bedtime as needed for sleep or itching.     lidocaine (LIDODERM) 5 % Place 1 patch onto the skin daily as needed (pain). Remove & Discard patch within 12 hours or as directed by MD     loratadine (CLARITIN) 10 MG tablet Take 10 mg by mouth daily as needed for allergies.     memantine (NAMENDA) 10 MG tablet Take 20 mg by mouth at bedtime.     metFORMIN (GLUCOPHAGE) 500 MG tablet Take 500 mg by mouth at bedtime.     Pimavanserin Tartrate (NUPLAZID) 34 MG CAPS Take 34 mg by mouth daily.     polyethylene glycol (MIRALAX) 17 g packet Take 17 g by mouth daily.     rasagiline (AZILECT) 1 MG TABS tablet Take 1 mg by mouth daily.     simvastatin (ZOCOR) 20 MG tablet Take 20 mg by mouth every evening.     No current facility-administered medications for this visit.    REVIEW OF SYSTEMS:  '[X]'$  denotes positive finding, '[ ]'$  denotes negative finding Cardiac  Comments:  Chest pain or chest pressure:    Shortness of breath upon exertion:    Short of breath when lying flat:    Irregular heart rhythm:        Vascular    Pain in calf, thigh, or hip brought on by ambulation:    Pain in feet at night that wakes you up from your sleep:     Blood clot in your veins:    Leg swelling:         Pulmonary    Oxygen at home:    Productive cough:     Wheezing:         Neurologic    Sudden weakness in arms or legs:     Sudden numbness in arms or legs:     Sudden onset of difficulty speaking or slurred speech:    Temporary loss of vision in one eye:     Problems with dizziness:         Gastrointestinal    Blood in stool:      Vomited blood:  Genitourinary    Burning when urinating:     Blood in urine:        Psychiatric    Major depression:         Hematologic    Bleeding problems:    Problems with blood clotting too easily:        Skin    Rashes or ulcers:        Constitutional    Fever or chills:      PHYSICAL EXAM: Vitals:   06/10/21 1211 06/10/21 1217  BP: 132/76 128/82  Pulse: 94   Resp: 18   Temp: (!) 97.2 F (36.2 C)   TempSrc: Temporal   SpO2: 97%   Weight: 225 lb (102.1 kg)   Height: '6\' 2"'$  (1.88 m)     GENERAL: The patient is a well-nourished male, in no acute distress. The vital signs are documented above. CARDIAC: There is a regular rate and rhythm.  VASCULAR:  Left neck incision clean dry and intact PULMONARY: No respiratory distress. MUSCULOSKELETAL: There are no major deformities or cyanosis. NEUROLOGIC:  Left upper extremity 0 out of 5 but able to lift his left leg off the wheelchair from his previous stroke years ago Right upper extremity 5 out of 5 and right lower extremity 4+ out of 5  DATA:   Carotid duplex today shows known right ICA occlusion and a widely patent left carotid stent after recent TCAR.  Assessment/Plan:  75 year old male status post left TCAR for asymptomatic high-grade greater than 80% stenosis in the setting of contralateral ICA occlusion.  He had a small stroke in the hospital with small left MCA/basal ganglia infarcts after the TCAR.  Fortunately he went to CIR and was able to rehabilitate and has recovered very well and appears to be back to his neurologic baseline.  Discussed from my standpoint he can stop the Plavix and do aspirin Eliquis.  I will see him again in 9 months with carotid duplex.  He will continue outpatient PT.  [name], MD [facility] [place] Office: [phone]

## 2021-06-11 ENCOUNTER — Other Ambulatory Visit: Payer: Self-pay

## 2021-06-11 ENCOUNTER — Telehealth: Payer: Self-pay

## 2021-06-11 DIAGNOSIS — I6522 Occlusion and stenosis of left carotid artery: Secondary | ICD-10-CM

## 2021-06-11 NOTE — Telephone Encounter (Signed)
Patient seen yesterday in clinic and advised to stop Plavix and only take Eliquis and Aspirin. Wife calls today to ask if patient should take asa twice a day since he take Eliquis twice a day. Advised her he only needed one 81 mg aspirin per day - take near the same time each day. She verbalized understanding.

## 2021-06-26 ENCOUNTER — Other Ambulatory Visit: Payer: Self-pay | Admitting: *Deleted

## 2021-06-26 NOTE — Patient Outreach (Signed)
[facility]) Care Management  06/26/2021  Tierney Hemmerling April 22, 1946 [id number]   Outgoing call placed to member's wife, no answer, HIPAA compliant voice message left.  Will follow up within the next 3-4 business days.  [name], [place], MSN [place] [phone]

## 2021-07-01 ENCOUNTER — Other Ambulatory Visit: Payer: Self-pay | Admitting: *Deleted

## 2021-07-01 DIAGNOSIS — E1169 Type 2 diabetes mellitus with other specified complication: Secondary | ICD-10-CM | POA: Diagnosis not present

## 2021-07-01 DIAGNOSIS — E785 Hyperlipidemia, unspecified: Secondary | ICD-10-CM | POA: Diagnosis not present

## 2021-07-01 DIAGNOSIS — I6930 Unspecified sequelae of cerebral infarction: Secondary | ICD-10-CM | POA: Diagnosis not present

## 2021-07-01 DIAGNOSIS — G20 Parkinson's disease: Secondary | ICD-10-CM | POA: Diagnosis not present

## 2021-07-01 DIAGNOSIS — I10 Essential (primary) hypertension: Secondary | ICD-10-CM | POA: Diagnosis not present

## 2021-07-01 DIAGNOSIS — F321 Major depressive disorder, single episode, moderate: Secondary | ICD-10-CM | POA: Diagnosis not present

## 2021-07-01 NOTE — Patient Outreach (Signed)
[facility]) Care Management  07/01/2021  Robert [name] 06-02-1946 [id number]   Outreach attempt #2, successful to wife.  State member has been doing well.  Was seen by vascular, PCP, and ENT over the last month.  Denies any urgent concerns, encouraged to contact this care manager with questions.  Agrees to follow up within the next month.   Goals Addressed             This Visit's Progress    [facility]) Learn More About My Health   On track    Timeframe:  Long-Range Goal Priority:  Medium Start Date:        8/4                   Expected End Date:   12/4 (date reset)                   Barriers: Knowledge    - ask questions - speak up when I don't understand    Why is this important?   The best way to learn about your health and care is by talking to the doctor and nurse.  They will answer your questions and give you information in the way that you like best.    Notes: for wife  4/27 - Confirmed wife's understanding of surgery and sepsis, signs/symptoms of infection  5/4 - Encouraged wife to contact this care manager or provider if any concerns arise  5/17 - Wife verbalizes understanding of recent gall bladder surgery, denies questions.  No signs/symptoms of infection or complications  [id number] - Wife is member's caregiver, has additional help when needed to care for member  6/24 - Per wife, member is now active with PT to help increase strength  8/4 - Member was admitted for carotid procedure, had stroke while hospitalized.  New having to work back up to baseline activity level with help of PT/OT  9/6 - Has been able to restart outpatient therapy sessions, wife has seen improvement.  He will have speech therapy in the home, awaiting new orders.     [facility]) Make and Keep All Appointments   On track    Timeframe:  Short-Term Goal Priority:  High Start Date:           8/4                 Expected End Date:   10/4  Barriers: Other - Potential Caregiver  stress.  Patient disabled, wife is only caregiver                       - ask family or friend for a ride - call to cancel if needed - keep a calendar with appointment dates    Why is this important?   Part of staying healthy is seeing the doctor for follow-up care.  If you forget your appointments, there are some things you can do to stay on track.    Notes:   4/27 - Confirmed member has seen surgeon since having gall bladder removed.  Wife aware of when to contact MD with changes, will continue PCP follow up  5/4 - PCP appointment scheduled for 5/10  5/17 - Confirmed member attended PCP appointment, wife denies any concerns.  5/27 - All appointments up to date, been out of hospital for 30 days, wife will continue to monitor to decrease risk of readmission.  Will follow up with MD as needed to stay  stable  6/24 - No follow ups since last outreach, wife has calendar of upcoming appointments  8/4 - Patient recently discharged from [facility].  Has follow up appointment scheduled with PCP, date not provided by wife.  Appointments with vascular on 8/16 and with rehab provider on 9/13.  Wife will provide transportation  9/6 - Wife report member will start seeing a NP through the [place] as well as support staff (dietician, pharmacy tech).  They will be visiting the home once a month, will keep Dr. Orland Mustard as his PCP until [facility]        [name], RN, MSN [place] Manager [phone]

## 2021-07-08 ENCOUNTER — Encounter: Payer: Self-pay | Admitting: Physical Medicine & Rehabilitation

## 2021-07-08 ENCOUNTER — Other Ambulatory Visit: Payer: Self-pay

## 2021-07-08 ENCOUNTER — Encounter: Payer: Medicare Other | Attending: Physical Medicine & Rehabilitation | Admitting: Physical Medicine & Rehabilitation

## 2021-07-08 VITALS — BP 138/79 | HR 77 | Temp 98.7°F | Ht 74.0 in | Wt 220.0 lb

## 2021-07-08 DIAGNOSIS — R252 Cramp and spasm: Secondary | ICD-10-CM | POA: Diagnosis not present

## 2021-07-08 DIAGNOSIS — I69398 Other sequelae of cerebral infarction: Secondary | ICD-10-CM | POA: Insufficient documentation

## 2021-07-08 DIAGNOSIS — I6522 Occlusion and stenosis of left carotid artery: Secondary | ICD-10-CM

## 2021-07-08 NOTE — Progress Notes (Signed)
Subjective:    Patient ID: Robert [name], male    DOB: [birth year], 75 y.o.   MRN: [id number] 75 y.o. 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 Carlis [name]. On evening post procedure, he was found to have confusion with right sided weakness, left gaze preference and speech deficits. MRI brain done revealing scattered embolic infarcts in left MCA territory.  Dr. Leonie [name] felt stroke likely complication of TCAR and to continue DAPT with permissive hypertension and liberalize blood pressure up to 170.  Patient's verbal output was improving, right-sided weakness had resolved but he continued to have expressive deficits with right inattention.  Therapy evaluations revealed functional decline and CIR was recommended for follow-up therapy  Admit date: 05/15/2021 Discharge date: 05/28/2021  He  has had improvement in activity tolerance, balance, postural control as well as ability to compensate for deficits.  He required max assist overall to complete ADL tasks.  He required contact-guard assist for transfers with use of steady equipment.  He was able to ambulate 70 feet with contact-guard assist with hemiwalker and cues for gait pattern.  He requires min assist for basic cognitive tasks and mod assist for high-level complex tasks.  Status is advanced to dysphagia 3, thin liquids however was downgraded to dysphagia 1 for comfort.  Wife was educated on different textures to help with diet advancement per tolerance.  Patient's cognition was at baseline and no follow-up speech therapy was needed after discharge  HPI Living at home with wife  Going to OP PT at [facility] 2 x per week Xeomin injection 05/23/2021 Left Pec 50U, Biceps 100U, FCR 50, FDS 50 Pain Inventory Average Pain  0 Pain Right Now 0 My pain is  no pain Neuro visit next week  Sees Urology Dr Robert [name] next month  Seen by Dr Robert [name] last week Seen by Dr Robert [name], has 70mof/u now off Plavix Has been on Eliquis and since the last CVA on Baby ASA '81mg'$  per day  Planning to have sebaceous cyst removed end of month, instructed pt to get OK from Neuro (Robert [name]) to stop Eliquis, ASA  BOWEL Number of stools per week: 4 Oral laxative use Yes  Type of laxative miralax  BLADDER Foley Able to self cath No     Mobility walk with assistance use a walker ability to climb steps?  no do you drive?  no use a wheelchair needs help with transfers  Function disabled: date disabled 14 yrs ago I need assistance with the following:  dressing, bathing, toileting, meal prep, household duties, and shopping  Neuro/Psych tremor trouble walking anxiety  Prior Studies Any changes since last visit?  no  Physicians involved in your care Any changes since last visit?  no   Family History  Problem Relation Age of Onset   Diabetes Sister    Lung cancer Brother        [place] 9/11 [place]   Other Mother        passed away young- non medical   Alzheimer's disease Father    Healthy Son    Social History   Socioeconomic History   Marital status: Married    Spouse name: Robert [name]  Number of children: 1   Years of education: 12   Highest education  level: High school graduate  Occupational History   Occupation: retired    Comment: Public librarian  Tobacco Use   Smoking status: Former    Years: 10.00    Types: Cigarettes    Quit date: 10/26/1970    Years since quitting: 50.7   Smokeless tobacco: Never  Vaping Use   Vaping Use: Never used  Substance and Sexual Activity   Alcohol use: No   Drug use: No   Sexual activity: Not on file  Other Topics Concern   Not on file  Social History Narrative   Lives in [place] with wife. He is a English as a second language teacher.   Has one son who  lives in [place]. He is healthy.   [facility] [place] [place].      Patient is right-handed. He lives with his wife in a one level home.   Social Determinants of Health   Financial Resource Strain: Not on file  Food Insecurity: Food Insecurity Present   Worried About Robert [name] in the Last Year: Sometimes true   Ran Out of Food in the Last Year: Sometimes true  Transportation Needs: No Transportation Needs   Lack of Transportation (Medical): No   Lack of Transportation (Non-Medical): No  Physical Activity: Not on file  Stress: Not on file  Social Connections: Not on file   Past Surgical History:  Procedure Laterality Date   CYSTOSCOPY WITH RETROGRADE URETHROGRAM N/A 10/23/2015   Procedure: CYSTOSCOPY WITH RETROGRADE URETHROGRAM;  Surgeon: Robert Snare, MD;  Location: [facility];  Service: Urology;  Laterality: N/A;   CYSTOSCOPY WITH URETHRAL DILATATION N/A 10/23/2015   Procedure: CYSTOSCOPY WITH URETHRAL BALLOON DILATATION;  Surgeon: Robert Snare, MD;  Location: [facility];  Service: Urology;  Laterality: N/A;  BALLOON DILATION    IVC FILTER PLACEMENT ([place] HX)  2008   LAPAROSCOPIC CHOLECYSTECTOMY SINGLE PORT N/A 01/09/2021   Procedure: LAPAROSCOPIC CHOLECYSTECTOMY WITH IOC;  Surgeon: Robert Boston, MD;  [facility];  Service: General;  Laterality: N/A;  90 MIN   TRANSCAROTID ARTERY REVASCULARIZATION  Left 05/12/2021   Procedure: LEFT TRANSCAROTID ARTERY REVASCULARIZATION;  Surgeon: Robert Heck, MD;  Location: [facility];  Service: Vascular;  Laterality: Left;   Past Medical History:  Diagnosis Date   Anemia    BPH (benign prostatic hyperplasia)    CVA (cerebral vascular accident) ([place])    with left sided hemiparesis   Diabetes mellitus without complication ([place])    Diverticulosis    Frequency of urination    GERD (gastroesophageal reflux disease)    Gross hematuria    History of acute pyelonephritis     10-13-2012   History of CVA with residual deficit    2008--  left side of body weakness and foot drop (wears leg brace and uses cane)   History of DVT of lower extremity    2008--  cva   Hypercholesteremia    Hyperlipidemia    Hyperlipidemia    Hypertension    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 2008   Left leg DVT ([place])    Neuromuscular disorder ([place])    Parkinsons   S/P insertion of IVC (inferior vena caval) filter    2008   Urethral stricture    Urgency of urination    Urinary retention    Weakness of left side of body    secondary to cva 2008   Wears glasses    Wears hearing aid  bilateral-- wears intermittantly   BP (!) 149/82   Pulse 77   Temp 98.7 F (37.1 C)   Ht '6\' 2"'$  (1.88 m)   Wt 220 lb (99.8 kg) Comment: reported  SpO2 98%   BMI 28.25 kg/m   Opioid Risk Score:   Fall Risk Score:  `1  Depression screen PHQ 2/9  Depression screen [facility] 2/9 07/08/2021 02/20/2020  Decreased Interest 1 (No Data)  Down, Depressed, Hopeless 1 (No Data)  PHQ - 2 Score 2 -  Altered sleeping 0 -  Tired, decreased energy 2 -  Change in appetite 0 -  Feeling bad or failure about yourself  0 -  Trouble concentrating 0 -  Moving slowly or fidgety/restless 1 -  Suicidal thoughts 0 -  PHQ-9 Score 5 -  Difficult doing work/chores Somewhat difficult -     Review of Systems  Constitutional: Negative.   HENT: Negative.    Eyes: Negative.   Respiratory: Negative.    Cardiovascular: Negative.   Gastrointestinal: Negative.   Endocrine:       Diabetic  Genitourinary:        Foley--urine retention  Musculoskeletal:  Positive for gait problem.  Skin: Negative.   Allergic/Immunologic: Negative.   Neurological:  Positive for tremors.  Hematological:  Bruises/bleeds easily.       Eliquis  Psychiatric/Behavioral:  The patient is nervous/anxious.   All other systems reviewed and are negative.     Objective:   Physical Exam Vitals reviewed.  Constitutional:      Appearance:  Normal appearance.  HENT:     Head: Normocephalic and atraumatic.  Eyes:     Extraocular Movements: Extraocular movements intact.     Conjunctiva/sclera: Conjunctivae normal.     Pupils: Pupils are equal, round, and reactive to light.  Musculoskeletal:     Right lower leg: Edema present.     Left lower leg: Edema present.     Comments: Trace ankle edema bilaterally No pain with left shoulder abduction he has mildly diminished left shoulder external rotation.  Lacks around 10 degrees of elbow extension able to range wrist and fingers out fully on the left side. Wears a left double metal upright AFO with articulating double channel ankle No pain with lower extremity range of motion  Skin:    General: Skin is warm and dry.  Neurological:     Mental Status: He is alert and oriented to person, place, and time.     Comments: Speech with mild dysarthria Motor strength is 5/5 in the right deltoid, bicep, tricep, grip, flexor, extensor, ankle dorsiflexor plantarflexion Left upper extremity is to minus at the elbow flexor and shoulder abductor, 0 at the finger wrist flexor.   Psychiatric:        Mood and Affect: Mood normal.        Behavior: Behavior normal.    Tone MAS 3 at the elbow flexor MAS 3 at the shoulder adductor, MAS 2 at the finger and wrist flexors on the left.      Assessment & Plan:  1.  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  He is continue with outpatient PT twice a week Will repeat Xeomin injection in 6 weeks Left Pec 50U, Biceps 100U, FCR 50, FDS 50 Add Brachiorad 50U to Xeomin next visit

## 2021-07-08 NOTE — Patient Instructions (Signed)
Please ask Robert [name] about stopping Aspirin and Eliquis prior to sebaceous cyst removal

## 2021-07-10 ENCOUNTER — Inpatient Hospital Stay: Payer: Medicare Other | Admitting: Adult Health

## 2021-07-15 ENCOUNTER — Encounter: Payer: Self-pay | Admitting: Adult Health

## 2021-07-15 ENCOUNTER — Ambulatory Visit (INDEPENDENT_AMBULATORY_CARE_PROVIDER_SITE_OTHER): Payer: Medicare Other | Admitting: Adult Health

## 2021-07-15 ENCOUNTER — Other Ambulatory Visit: Payer: Self-pay | Admitting: Physician Assistant

## 2021-07-15 VITALS — BP 135/76 | HR 90

## 2021-07-15 DIAGNOSIS — I63412 Cerebral infarction due to embolism of left middle cerebral artery: Secondary | ICD-10-CM

## 2021-07-15 DIAGNOSIS — I6930 Unspecified sequelae of cerebral infarction: Secondary | ICD-10-CM

## 2021-07-15 NOTE — Progress Notes (Signed)
I agree with the above plan 

## 2021-07-15 NOTE — Progress Notes (Signed)
This patient is having a cyst removed from his neck this week.  Ok to hold aspirin and Eliquis from vascular standpoint for procedure however I would also check with his PCP or whomever prescribes Eliquis.  This plan was communicated to his wife [name] in person.  Dagoberto Ligas, PA-C [facility] [phone] 07/15/2021  10:22 AM

## 2021-07-15 NOTE — Progress Notes (Signed)
[facility] [address] [place]. [place] [address] [phone]       HOSPITAL FOLLOW UP NOTE  Robert [name] Date of Birth:  1946-08-17 Medical Record Number:  [id number]   Reason for Referral:  hospital stroke follow up    SUBJECTIVE:   CHIEF COMPLAINT:  Chief Complaint  Patient presents with   Follow-up    Room 3. Pt here with wife.     HPI:   [name] is a 75 y.o. 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 Post op he developed L gaze preference, confusion, right-sided weakness and expressive and receptive aphasia for which a stroke code was called. He was not candidate for IVTPA given recent surgery and NIR declined offering intervention given the risks of surgery outweighed benefits.  Evaluated by Dr. Leonie Man for left MCA and BG infarcts in setting of left MCA M2 occlusion s/p L ICA TCAR with subsequent embolization.  LDL 38.  A1c 6.4.  Recommended continuation of aspirin Plavix and vascular surgery to decide duration as well as continuation of simvastatin 20 mg daily.  He also resumed [facility] for history of recurrent DVT in 2008 and 2020.  HTN stable on amlodipine and losartan.  Therapy eval's recommended CIR for residual expressive deficits and right inattention and discharged to CIR on 05/15/2021 -05/28/2021.  Today, 07/15/2021, [name] is being seen for hospital follow-up accompanied by his wife.  Overall doing well.  Denies residual right-sided weakness or aphasia.  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  Currently participating with OP PT at [facility] 2x per week.  Using hemiwalker for short distance and assistance - using PTA but able to ambulate longer distance.  Currently receiving Botox from Dr. Letta Pate for  spasticity with patient reporting benefit.  Denies new stroke/TIA symptoms.  Routinely followed by  VS and plans to start comprehensive home visits thru [place] next week.  He does endorse slight worsening of depression since recent stroke and has a counseling session this week with Dr. Burnett Kanaris. He also remains on buspirone.  Robert [name] has since stopped plavix and remains on aspirin and Eliquis as well as simvastatin tolerating without side effects.  Blood pressure today 135/76. Just received a new BP cuff from [place] - has been higher at home but wife concerned that cuff not large enough. F/u with Robert [name] 8/16 with carotid duplex showing known right ICA occlusion and widely patent left carotid stent s/p TCAR - plans on repeat carotid duplex in 9 months. Glucose levels stable - this AM reading 112 - wife assists with dietary intake.  Remains on [facility] for [facility]'s [facility]  He is scheduled to undergo sebaceous cyst removal next week - per wife, cleared to stop asa and Eliquis 3 days prior to procedure by [facility] NP Deland Pretty, NP).  No further concerns at this time.     PERTINENT IMAGING  06/10/2021 carotid duplex Summary:  Right Carotid: Total occlusion of the right ICA. Left Carotid: Patent left ICA stent.   05/12/21 CT Head WO IV Contrast 1. Old right MCA territory infarct without acute intracranial abnormality. 2. ASPECTS is 10.   05/12/21 CT Angio Head and Neck W WO IV Contrast 1. Suspected occlusion of a left MCA M2 branch  at a branch point in the Sylvian fissure.  2. 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 3. Patent left ICA stent. 4. Gas tracking superiorly along the course of the left sternocleidomastoid muscle, possibly recent central venous catheter placement attempt.   05/13/21 MRI Brain WO IV Contrast Tiny scattered left MCA acute infarcts.  Old large chronic right MCA infarct   05/14/21 Echocardiogram Complete  IMPRESSIONS   1. Left ventricular ejection  fraction, by estimation, is 55 to 60%. The  left ventricle has normal function. The left ventricle has no regional  wall motion abnormalities. There is mild left ventricular hypertrophy.  Left ventricular diastolic parameters  are consistent with Grade I diastolic dysfunction (impaired relaxation).   2. Right ventricular systolic function is normal. The right ventricular  size is normal. Tricuspid regurgitation signal is inadequate for assessing  PA pressure.   3. The mitral valve is normal in structure. No evidence of mitral valve  regurgitation. No evidence of mitral stenosis.   4. The aortic valve is tricuspid. Aortic valve regurgitation is not  visualized. Mild aortic valve sclerosis is present, with no evidence of  aortic valve stenosis.   5. Aortic dilatation noted. There is mild dilatation of the ascending  aorta, measuring 38 mm.   6. The inferior vena cava is dilated in size with <50% respiratory  variability, suggesting right atrial pressure of 15 mmHg.     ROS:   14 system review of systems performed and negative with exception of those listed in HPI  PMH:  Past Medical History:  Diagnosis Date   Anemia    BPH (benign prostatic hyperplasia)    CVA (cerebral vascular accident) ([place])    with left sided hemiparesis   Diabetes mellitus without complication ([place])    Diverticulosis    Frequency of urination    GERD (gastroesophageal reflux disease)    Gross hematuria    History of acute pyelonephritis    10-13-2012   History of CVA with residual deficit    2008--  left side of body weakness and foot drop (wears leg brace and uses cane)   History of DVT of lower extremity    2008--  cva   Hypercholesteremia    Hyperlipidemia    Hyperlipidemia    Hypertension    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 2008   Left leg DVT ([place])    Neuromuscular disorder (HCC)    Parkinsons   S/P insertion of IVC (inferior vena caval) filter    2008   Urethral stricture    Urgency of  urination    Urinary retention    Weakness of left side of body    secondary to cva 2008   Wears glasses    Wears hearing aid    bilateral-- wears intermittantly    PSH:  Past Surgical History:  Procedure Laterality Date   CYSTOSCOPY WITH RETROGRADE URETHROGRAM N/A 10/23/2015   Procedure: CYSTOSCOPY WITH RETROGRADE URETHROGRAM;  Surgeon: [name], MD;  [facility];  Service: Urology;  Laterality: N/A;   CYSTOSCOPY WITH URETHRAL DILATATION N/A 10/23/2015   Procedure: CYSTOSCOPY WITH URETHRAL BALLOON DILATATION;  Surgeon: [name], MD;  Location[facility];  Service: Urology;  Laterality: N/A;  BALLOON DILATION    IVC FILTER PLACEMENT ([name] [facility])  2008   LAPAROSCOPIC CHOLECYSTECTOMY SINGLE PORT N/A 01/09/2021   Procedure: LAPAROSCOPIC CHOLECYSTECTOMY WITH IOC;  Surgeon: [name], MD;  Location: [facility];  Service: General;  Laterality: N/A;  90 MIN   TRANSCAROTID ARTERY REVASCULARIZATION  Left 05/12/2021   Procedure: LEFT TRANSCAROTID ARTERY REVASCULARIZATION;  Surgeon: [name], MD;  Location[facility];  Service: Vascular;  Laterality: Left;    Social History:  Social History   Socioeconomic History   Marital status: Married    Spouse name: [name]   Number of children: 1   Years of education: 12   Highest education level: High school graduate  Occupational History   Occupation: retired    Comment: Public librarian  Tobacco Use   Smoking status: Former    Years: 10.00    Types: Cigarettes    Quit date: 10/26/1970    Years since quitting: 50.7   Smokeless tobacco: Never  Vaping Use   Vaping Use: Never used  Substance and Sexual Activity   Alcohol use: No   Drug use: No   Sexual activity: Not on file  Other Topics Concern   Not on file  Social History Narrative   Lives in [place] with wife. He is a English as a second language teacher.   Has one son who lives in [place]. He is healthy.   [facility] [place]  [place].      Patient is right-handed. He lives with his wife in a one level home.   Social Determinants of Health   Financial Resource Strain: Not on file  Food Insecurity: Food Insecurity Present   Worried About [place] in the Last Year: Sometimes true   Ran Out of Food in the Last Year: Sometimes true  Transportation Needs: No Transportation Needs   Lack of Transportation (Medical): No   Lack of Transportation (Non-Medical): No  Physical Activity: Not on file  Stress: Not on file  Social Connections: Not on file  Intimate Partner Violence: Not on file    Family History:  Family History  Problem Relation Age of Onset   Diabetes Sister    Lung cancer Brother        Post 9/11 voluntary work in [place]    Other Mother        passed away young- non medical   Alzheimer's disease Father    Healthy Son     Medications:   Current Outpatient Medications on File Prior to Visit  Medication Sig Dispense Refill   apixaban (ELIQUIS) 5 MG TABS tablet Take 1 tablet (5 mg total) by mouth 2 (two) times daily. 60 tablet 0   aspirin EC 81 MG tablet Take 81 mg by mouth daily. Swallow whole.     busPIRone (BUSPAR) 7.5 MG tablet Take 11.25 mg by mouth 2 (two) times daily.     Carbidopa-Levodopa ER (SINEMET CR) 25-100 MG tablet controlled release Take 1-2.5 tablets by mouth See admin instructions. Takes 2.5 tablets in the 9am, 2.5 tablets in the 1 pm, and 1 tablet at 5pm (Patient taking differently: Take 1-2.5 tablets by mouth See admin instructions. Takes 2.5 tablets BID, and 1 tablet Once a day)     memantine (NAMENDA) 10 MG tablet Take 10 mg by mouth 2 (two) times daily.     metFORMIN (GLUCOPHAGE) 500 MG tablet Take 500 mg by mouth at bedtime.     nystatin (MYCOSTATIN/NYSTOP) powder Apply 1 application topically 2 (two) times daily.     omeprazole (PRILOSEC) 20 MG capsule Take 20 mg by mouth daily.     Pimavanserin Tartrate (NUPLAZID) 34 MG CAPS Take 34 mg by  mouth daily.     rasagiline (AZILECT) 1 MG TABS tablet Take 1 mg by mouth daily.     simvastatin (ZOCOR) 40 MG tablet Take 40 mg by mouth daily.     acetaminophen (TYLENOL) 325 MG tablet Take 1-2 tablets (325-650 mg total) by mouth every 4 (four) hours as needed for mild pain. (Patient not taking: Reported on 07/15/2021)     hydrOXYzine (ATARAX/VISTARIL) 10 MG tablet Take 10 mg by mouth at bedtime as needed for sleep or itching. (Patient not taking: Reported on 07/15/2021)     lidocaine (LIDODERM) 5 % Place 1 patch onto the skin daily as needed (pain). Remove & Discard patch within 12 hours or as directed by MD (Patient not taking: Reported on 07/15/2021)     loratadine (CLARITIN) 10 MG tablet Take 10 mg by mouth daily as needed for allergies. (Patient not taking: Reported on 07/15/2021)     polyethylene glycol (MIRALAX) 17 g packet Take 17 g by mouth daily. (Patient not taking: Reported on 07/15/2021)     simvastatin (ZOCOR) 20 MG tablet Take 20 mg by mouth every evening. (Patient not taking: Reported on 07/15/2021)     No current facility-administered medications on file prior to visit.    Allergies:   Allergies  Allergen Reactions   Propofol Other (See Comments)    "hiccups for weeks"    Lisinopril Cough      OBJECTIVE:  Physical Exam  Vitals:   07/15/21 0901  BP: 135/76  Pulse: 90   There is no height or weight on file to calculate BMI. No results found.  Post stroke PHQ 2/9 Depression screen PHQ 2/9 07/08/2021  Decreased Interest 1  Down, Depressed, Hopeless 1  PHQ - 2 Score 2  Altered sleeping 0  Tired, decreased energy 2  Change in appetite 0  Feeling bad or failure about yourself  0  Trouble concentrating 0  Moving slowly or fidgety/restless 1  Suicidal thoughts 0  PHQ-9 Score 5  Difficult doing work/chores Somewhat difficult     General: well developed, well nourished, very pleasant elderly African-American male, seated, in no evident distress Head: head  normocephalic and atraumatic.   Neck: supple with no carotid or supraclavicular bruits Cardiovascular: regular rate and rhythm, no murmurs Musculoskeletal: no deformity Skin:  no rash/petichiae Vascular:  Normal pulses all extremities   Neurologic Exam Mental Status: Awake and fully alert.  Mild dysarthria.  No evidence of aphasia.  Able to follow commands without difficulty.  Oriented to place and time. Recent and remote memory intact. Attention span, concentration and fund of knowledge appropriate. Mood and affect appropriate.  Cranial Nerves: Fundoscopic exam reveals sharp disc margins. Pupils equal, briskly reactive to light. Extraocular movements full without nystagmus. Visual fields full to confrontation. Hearing intact. Facial sensation intact.  Left lower facial weakness.  Tongue, palate moves normally and symmetrically.  Motor: Normal strength, bulk and tone right upper and lower extremity.  Chronic left spastic hemiparesis with AFO brace Sensory.: intact to touch , pinprick , position and vibratory sensation.  Coordination: Rapid alternating movements normal on right side. Finger-to-nose and heel-to-shin performed accurately on right side. Gait and Station: Deferred Reflexes: Brisk LUE and LLE. Toes downgoing.     NIHSS  6 (although majority of deficits chronic) Modified Rankin  3-4       ASSESSMENT: Aidin Doane is a 75 y.o. year old male with recent left MCA and BG stroke in setting of left MCA M2 occlusion s/p L ICA  TCAR on 05/12/2021. 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      PLAN:  Left MCA and BG infarcts:  R MCA stroke 2008 Residual deficit: Recovered well from recent stroke with only slight worsening of chronic left hemiparesis and gait.  Continue working with therapies and routine  follow-up with PMR receiving Botox with benefit.  Slight worsening of depression post stroke with plans on starting counseling this week which was highly encouraged Continue aspirin 81 mg daily and Eliquis (apixaban) daily  and simvastatin 20 mg daily for secondary stroke prevention.  Remains on Eliquis for history of recurrent DVTs Discussed secondary stroke prevention measures and importance of close PCP follow up for aggressive stroke risk factor management. I have gone over the pathophysiology of stroke, warning signs and symptoms, risk factors and their management in some detail with instructions to go to the closest emergency room for symptoms of concern. HTN: BP goal <130/90.  Stable on amlodipine and losartan per PCP HLD: LDL goal <70. Recent LDL 38 on simvastatin 20 mg daily.  DMII: A1c goal<7.0. Recent A1c 6.4.  On metformin monitored by PCP L ICA stenosis: s/p TCAR.  Followed by Robert [name]. Advised wife to reach out to their office in regards to stopping aspirin for sebaceous cyst in setting of recent TCAR procedure. She plans to reach out to their office today Parkinson's disease: Managed by [facility] neurology    Follow up in 4 months or call earlier if needed   CC:  [place] provider: Dr. Leonie Man PCP: [name], MD    I spent 53 minutes of face-to-face and non-face-to-face time with patient and wife.  This included previsit chart review including review of recent hospitalization, lab review, study review, electronic health record documentation, patient and wife education regarding recent stroke including etiology, secondary stroke prevention measures and importance of managing stroke risk factors, residual deficits and typical recovery time and answered all other questions to patient and wife's satisfaction  Frann Rider, AGNP-BC  [facility] [address] [place], [place] [address]  Phone [phone] Fax [phone] Note: This document was prepared with  digital dictation and possible smart phrase technology. Any transcriptional errors that result from this process are unintentional.

## 2021-07-15 NOTE — Patient Instructions (Addendum)
Continue aspirin 81 mg daily and Eliquis (apixaban) daily  and simvastatin  for secondary stroke prevention  Continue to follow up with PCP regarding cholesterol, blood pressure and diabetes management  Maintain strict control of hypertension with blood pressure goal below 130/90, diabetes with hemoglobin A1c goal below 7% and cholesterol with LDL cholesterol (bad cholesterol) goal below 70 mg/dL.   Continue working with therapies for hopeful ongoing recovery     Followup in the future with me in 4 months or call earlier if needed      Thank you for coming to see [place] at [facility]. I hope we have been able to provide you high quality care today.  You may receive a patient satisfaction survey over the next few weeks. We would appreciate your feedback and comments so that we may continue to improve ourselves and the health of our patients.

## 2021-07-18 DIAGNOSIS — G8194 Hemiplegia, unspecified affecting left nondominant side: Secondary | ICD-10-CM | POA: Diagnosis not present

## 2021-07-18 DIAGNOSIS — K219 Gastro-esophageal reflux disease without esophagitis: Secondary | ICD-10-CM | POA: Diagnosis not present

## 2021-07-18 DIAGNOSIS — N312 Flaccid neuropathic bladder, not elsewhere classified: Secondary | ICD-10-CM | POA: Diagnosis not present

## 2021-07-18 DIAGNOSIS — I639 Cerebral infarction, unspecified: Secondary | ICD-10-CM | POA: Diagnosis not present

## 2021-07-18 DIAGNOSIS — G20 Parkinson's disease: Secondary | ICD-10-CM | POA: Diagnosis not present

## 2021-07-29 ENCOUNTER — Other Ambulatory Visit: Payer: Self-pay | Admitting: *Deleted

## 2021-07-29 NOTE — Patient Outreach (Signed)
[place] [facility]) Care Management  07/29/2021  Robert [name] 07/24/1946 [id number]   Outgoing call placed to member's wife, state this is not a good time to talk, will call back.   If no call back, will follow up within the next 3-4 business days.  [name], [place], MSN [place] [phone]

## 2021-08-01 ENCOUNTER — Other Ambulatory Visit: Payer: Self-pay | Admitting: *Deleted

## 2021-08-01 NOTE — Patient Outreach (Signed)
[place] [facility]) Care Management  08/01/2021  Malyk Girouard 01/08/1946 [id number]   Outreach attempt #2, unsuccessful,unable to leave message. Will send outreach letter and follow up within the next 3-4 business days.  [name], [place], MSN [place] [phone]

## 2021-08-05 DIAGNOSIS — R338 Other retention of urine: Secondary | ICD-10-CM | POA: Diagnosis not present

## 2021-08-05 DIAGNOSIS — N35011 Post-traumatic bulbous urethral stricture: Secondary | ICD-10-CM | POA: Diagnosis not present

## 2021-08-06 ENCOUNTER — Other Ambulatory Visit: Payer: Self-pay | Admitting: *Deleted

## 2021-08-06 NOTE — Patient Outreach (Signed)
[place] [facility]) Care Management  08/06/2021  Ronel Rodeheaver 19-Jul-1946 [id number]   Outreach attempt #3 successful to wife.  State member is "still the same."  Denies any urgent concerns, encouraged to contact this care manager with questions.  Agrees to follow up within the next month.   Goals Addressed             This Visit's Progress    [facility]) Learn More About My Health   On track    Timeframe:  Long-Range Goal Priority:  Medium Start Date:        8/4                   Expected End Date:   12/4 (date reset)                   Barriers: Knowledge    - ask questions - speak up when I don't understand    Why is this important?   The best way to learn about your health and care is by talking to the doctor and nurse.  They will answer your questions and give you information in the way that you like best.    Notes: for wife  4/27 - Confirmed wife's understanding of surgery and sepsis, signs/symptoms of infection  5/4 - Encouraged wife to contact this care manager or provider if any concerns arise  5/17 - Wife verbalizes understanding of recent gall bladder surgery, denies questions.  No signs/symptoms of infection or complications  3/47 - Wife is member's caregiver, has additional help when needed to care for member  6/24 - Per wife, member is now active with PT to help increase strength  8/4 - Member was admitted for carotid procedure, had stroke while hospitalized.  New having to work back up to baseline activity level with help of PT/OT  9/6 - Has been able to restart outpatient therapy sessions, wife has seen improvement.  He will have speech therapy in the home, awaiting new orders.  10/12 - Continues with therapy sessions.  Wife report frustration regarding inability to work and having to pay for medical services.  She is inquiring about being able to be member's paid caregiver since he does not qualify for Medicaid.  Advised that this care manager  will collaborate with CSW and if this is possible will place referral for help with resources.     COMPLETED: [facility]) Make and Keep All Appointments   On track    Timeframe:  Short-Term Goal Priority:  High Start Date:           8/4                 Expected End Date:   10/4  Barriers: Other - Potential Caregiver stress.  Patient disabled, wife is only caregiver                       - ask family or friend for a ride - call to cancel if needed - keep a calendar with appointment dates    Why is this important?   Part of staying healthy is seeing the doctor for follow-up care.  If you forget your appointments, there are some things you can do to stay on track.    Notes:   4/27 - Confirmed member has seen surgeon since having gall bladder removed.  Wife aware of when to contact MD with changes, will continue PCP follow up  5/4 -  PCP appointment scheduled for 5/10  5/17 - Confirmed member attended PCP appointment, wife denies any concerns.  5/27 - All appointments up to date, been out of hospital for 30 days, wife will continue to monitor to decrease risk of readmission.  Will follow up with MD as needed to stay stable  6/24 - No follow ups since last outreach, wife has calendar of upcoming appointments  8/4 - Patient recently discharged from [place].  Has follow up appointment scheduled with PCP, date not provided by wife.  Appointments with vascular on 8/16 and with rehab provider on 9/13.  Wife will provide transportation  9/6 - Wife report member will start seeing a NP through the [place] as well as support staff (dietician, pharmacy tech).  They will be visiting the home once a month, will keep Dr. Orland Mustard as his PCP until established with [place]  10/12 - Member now has resources through the [place] but confirms Dr Orland Mustard remains PCP.  Will follow up with rehab MD on 11/1        [name], RN, MSN [place] [phone]

## 2021-08-15 DIAGNOSIS — R338 Other retention of urine: Secondary | ICD-10-CM | POA: Diagnosis not present

## 2021-08-25 DIAGNOSIS — I639 Cerebral infarction, unspecified: Secondary | ICD-10-CM | POA: Diagnosis not present

## 2021-08-25 DIAGNOSIS — G20 Parkinson's disease: Secondary | ICD-10-CM | POA: Diagnosis not present

## 2021-08-25 DIAGNOSIS — N312 Flaccid neuropathic bladder, not elsewhere classified: Secondary | ICD-10-CM | POA: Diagnosis not present

## 2021-08-25 DIAGNOSIS — K219 Gastro-esophageal reflux disease without esophagitis: Secondary | ICD-10-CM | POA: Diagnosis not present

## 2021-08-25 DIAGNOSIS — G8194 Hemiplegia, unspecified affecting left nondominant side: Secondary | ICD-10-CM | POA: Diagnosis not present

## 2021-08-26 ENCOUNTER — Encounter: Payer: Self-pay | Admitting: Physical Medicine & Rehabilitation

## 2021-08-26 ENCOUNTER — Other Ambulatory Visit: Payer: Self-pay

## 2021-08-26 ENCOUNTER — Encounter: Payer: Medicare Other | Attending: Physical Medicine & Rehabilitation | Admitting: Physical Medicine & Rehabilitation

## 2021-08-26 VITALS — BP 153/80 | HR 72 | Temp 98.3°F | Ht 74.0 in | Wt 220.0 lb

## 2021-08-26 DIAGNOSIS — G8110 Spastic hemiplegia affecting unspecified side: Secondary | ICD-10-CM | POA: Insufficient documentation

## 2021-08-26 DIAGNOSIS — G8114 Spastic hemiplegia affecting left nondominant side: Secondary | ICD-10-CM

## 2021-08-26 NOTE — Patient Instructions (Signed)

## 2021-08-26 NOTE — Progress Notes (Addendum)
Xeomin Injection for spasticity using needle EMG guidance  Dilution: 50 Units/ml Indication: Severe spasticity which interferes with ADL,mobility and/or  hygiene and is unresponsive to medication management and other conservative care Improvements in LUE tone that are now abating after Xeomin injection 05/23/21 Informed consent was obtained after describing risks and benefits of the procedure with the patient. This includes bleeding, bruising, infection, excessive weakness, or medication side effects. A REMS form is on file and signed. Needle: 27g 1" needle electrode Number of units per muscle Left Pec 50U, Biceps 50U,Brachialis 50  FCR 50, FDS 50 Left Brachiorad 50U  All injections were done after obtaining appropriate EMG activity and after negative drawback for blood. The patient tolerated the procedure well. Post procedure instructions were given. A followup appointment was made.

## 2021-09-04 ENCOUNTER — Other Ambulatory Visit: Payer: Self-pay | Admitting: *Deleted

## 2021-09-04 NOTE — Patient Outreach (Signed)
[place] [facility]) Care Management  09/04/2021  Robert [name] 12/22/1945 [id number]   Outgoing call placed to member's wife, state this is not a good time to talk and will return call.  If no call back, will follow up within the next 3-4 business days.  [name], [place], MSN [place] [phone]

## 2021-09-09 ENCOUNTER — Other Ambulatory Visit: Payer: Self-pay | Admitting: *Deleted

## 2021-09-09 DIAGNOSIS — R338 Other retention of urine: Secondary | ICD-10-CM | POA: Diagnosis not present

## 2021-09-09 DIAGNOSIS — N3000 Acute cystitis without hematuria: Secondary | ICD-10-CM | POA: Diagnosis not present

## 2021-09-09 DIAGNOSIS — N3942 Incontinence without sensory awareness: Secondary | ICD-10-CM | POA: Diagnosis not present

## 2021-09-09 NOTE — Patient Outreach (Signed)
[facility]) Care Management  [place]  09/09/2021   Robert [name] 12-30-1945 [id number]   Outreach attempt #2, successful to wife.  Denies any urgent concerns, encouraged to contact this care manager with questions.    Encounter Medications:  Outpatient Encounter Medications as of 09/09/2021  Medication Sig   apixaban (ELIQUIS) 5 MG TABS tablet Take 1 tablet (5 mg total) by mouth 2 (two) times daily.   aspirin EC 81 MG tablet Take 81 mg by mouth daily. Swallow whole.   busPIRone (BUSPAR) 7.5 MG tablet Take 11.25 mg by mouth 2 (two) times daily.   Carbidopa-Levodopa ER (SINEMET CR) 25-100 MG tablet controlled release Take 1-2.5 tablets by mouth See admin instructions. Takes 2.5 tablets in the 9am, 2.5 tablets in the 1 pm, and 1 tablet at 5pm (Patient taking differently: Take 1-2.5 tablets by mouth See admin instructions. Takes 2.5 tablets BID, and 1 tablet Once a day)   memantine (NAMENDA) 10 MG tablet Take 10 mg by mouth 2 (two) times daily.   metFORMIN (GLUCOPHAGE) 500 MG tablet Take 500 mg by mouth at bedtime.   nystatin (MYCOSTATIN/NYSTOP) powder Apply 1 application topically 2 (two) times daily.   omeprazole (PRILOSEC) 20 MG capsule Take 20 mg by mouth daily.   Pimavanserin Tartrate (NUPLAZID) 34 MG CAPS Take 34 mg by mouth daily.   rasagiline (AZILECT) 1 MG TABS tablet Take 1 mg by mouth daily.   simvastatin (ZOCOR) 40 MG tablet Take 40 mg by mouth daily.   No facility-administered encounter medications on file as of 09/09/2021.    Functional Status:  In your present state of health, do you have any difficulty performing the following activities: 05/15/2021 05/08/2021  Hearing? Tempie Donning  Vision? N N  Difficulty concentrating or making decisions? Tempie Donning  Walking or climbing stairs? Y Y  Comment - -  Dressing or bathing? Y Y  Doing errands, shopping? Y Y  Some recent data might be hidden    Fall/Depression Screening: Fall Risk  08/26/2021 07/08/2021  02/19/2021  Falls in the past year? 0 0 0  Number falls in past yr: - - 0  Injury with Fall? - - 0  Risk for fall due to : - Impaired balance/gait -  Follow up - - -   PHQ 2/9 Scores 08/26/2021 07/08/2021 02/19/2021 02/20/2020 09/12/2019  PHQ - 2 Score 2 2 - - -  PHQ- 9 Score - 5 - - -  Exception Documentation - - Other- indicate reason in comment box Other- indicate reason in comment box Other- indicate reason in comment box  Not completed - - - did not speak with patient, spoke with wife Assessment completed by wife    Assessment:   Care Plan Care Plan : General Plan of Care (Adult)  Updates made by [name], RN since 09/09/2021 12:00 AM     Problem: Quality of Life (General Plan of Care)      Long-Range Goal: Quality of Life Maintained Completed 09/09/2021  Start Date: 02/19/2021  Expected End Date: 08/29/2021  Recent Progress: On track  Priority: High  Note:   Assessed patient's thoughts about quality of life, goals and expectations, and dissatisfaction or desire to improve.   Identified issues of primary importance such as mental health, illness, exercise tolerance, pain, cognitive change, social isolation, finances and relationships.   5/4 - Confirmed member has follow up appointment with PCP in effort to manage chronic medical conditions.    8/4 - Date extended,  patient recently discharged from hospital  11/15 - Resolving due to duplicate goal     Task: Support and Maintain Acceptable Degree of Health, Comfort and Happiness Completed 09/09/2021  Due Date: 08/29/2021  Note:   Care Management Activities:    - community involvement promoted - self-expression encouraged - social relationships promoted - wellness behaviors promoted    Notes:   11/15 - Resolving due to duplicate goal     Problem: Difficulty managing chornic condition (Parkinsons) with several complications requiring recurrent admissions   Priority: High     Long-Range Goal: Wife will report  ongoing adequate management of chronic medical condition   Start Date: 09/09/2021  Expected End Date: 03/08/2022  Priority: High  Note:   Current Barriers:  Chronic Disease Management support and education needs related to Parkinsons  RNCM Clinical Goal(s):  Patient will verbalize understanding of plan for management of Parkinsons demonstrate understanding of rationale for each prescribed medication all prescribed, taking as instructed attend all scheduled medical appointments: PCP, Urology, cardiology work with Outpatient PT/OT to maintain strength  through collaboration with RN Care manager, provider, and care team.   Interventions: Inter-disciplinary care team collaboration (see longitudinal plan of care) Evaluation of current treatment plan related to  self management and patient's adherence to plan as established by provider  ParkinsonsNew goal. Evaluation of current treatment plan related to  Parkinsons ,  Caregiver burnout  self-management and patient's adherence to plan as established by provider. Discussed plans with patient for ongoing care management follow up and provided patient with direct contact information for care management team Advised patient to contact this care manager with questions; Collaborated with CSW regarding need for additonal help in the home; Reviewed scheduled/upcoming provider appointments including; Discussed plans with patient for ongoing care management follow up and provided patient with direct contact information for care management team;   Update 11/15 - Wife continue to report signs of burnout, need for additional help in the home.  She already has [place] several hours a week through the [employer] and Attendance program.  Aware that all other resources would need to be paid out of pocket.  She state member had foley catheter changed yesterday, has been having trouble today with pain, leaking with spasms, and bleeding.  Was seen by urology today,  given antibiotics and medications for spasms.    Patient Goals/Self-Care Activities: Patient will self administer medications as prescribed Patient will attend all scheduled provider appointments Patient will continue to perform ADL's independently  Follow Up Plan:  Telephone follow up appointment with care management team member scheduled for:  12/12 The patient has been provided with contact information for the care management team and has been advised to call with any health related questions or concerns.        Goals Addressed             This Visit's Progress    COMPLETED: [facility]) Learn More About My Health       Timeframe:  Long-Range Goal Priority:  Medium Start Date:        8/4                   Expected End Date:   12/4 (date reset)                   Barriers: Knowledge    - ask questions - speak up when I don't understand    Why is this important?   The best way  to learn about your health and care is by talking to the doctor and nurse.  They will answer your questions and give you information in the way that you like best.    Notes: for wife  4/27 - Confirmed wife's understanding of surgery and sepsis, signs/symptoms of infection  5/4 - Encouraged wife to contact this care manager or provider if any concerns arise  5/17 - Wife verbalizes understanding of recent gall bladder surgery, denies questions.  No signs/symptoms of infection or complications  1/50 - Wife is member's caregiver, has additional help when needed to care for member  6/24 - Per wife, member is now active with PT to help increase strength  8/4 - Member was admitted for carotid procedure, had stroke while hospitalized.  New having to work back up to baseline activity level with help of PT/OT  9/6 - Has been able to restart outpatient therapy sessions, wife has seen improvement.  He will have speech therapy in the home, awaiting new orders.  10/12 - Continues with therapy sessions.  Wife  report frustration regarding inability to work and having to pay for medical services.  She is inquiring about being able to be member's paid caregiver since he does not qualify for Medicaid.  Advised that this care manager will collaborate with CSW and if this is possible will place referral for help with resources.  11/15 - Resolving due to duplicate goal         Plan:  Follow-up: Patient agrees to Care Plan and Follow-up. Follow-up in 1 month(s)  [name], RN, MSN [place] [phone]

## 2021-09-20 DIAGNOSIS — N312 Flaccid neuropathic bladder, not elsewhere classified: Secondary | ICD-10-CM | POA: Diagnosis not present

## 2021-09-20 DIAGNOSIS — G20 Parkinson's disease: Secondary | ICD-10-CM | POA: Diagnosis not present

## 2021-09-20 DIAGNOSIS — K219 Gastro-esophageal reflux disease without esophagitis: Secondary | ICD-10-CM | POA: Diagnosis not present

## 2021-09-20 DIAGNOSIS — G8194 Hemiplegia, unspecified affecting left nondominant side: Secondary | ICD-10-CM | POA: Diagnosis not present

## 2021-09-20 DIAGNOSIS — I639 Cerebral infarction, unspecified: Secondary | ICD-10-CM | POA: Diagnosis not present

## 2021-09-30 DIAGNOSIS — I6930 Unspecified sequelae of cerebral infarction: Secondary | ICD-10-CM | POA: Diagnosis not present

## 2021-09-30 DIAGNOSIS — I10 Essential (primary) hypertension: Secondary | ICD-10-CM | POA: Diagnosis not present

## 2021-09-30 DIAGNOSIS — G20 Parkinson's disease: Secondary | ICD-10-CM | POA: Diagnosis not present

## 2021-09-30 DIAGNOSIS — E1169 Type 2 diabetes mellitus with other specified complication: Secondary | ICD-10-CM | POA: Diagnosis not present

## 2021-09-30 DIAGNOSIS — L309 Dermatitis, unspecified: Secondary | ICD-10-CM | POA: Diagnosis not present

## 2021-09-30 DIAGNOSIS — E785 Hyperlipidemia, unspecified: Secondary | ICD-10-CM | POA: Diagnosis not present

## 2021-09-30 DIAGNOSIS — Z7984 Long term (current) use of oral hypoglycemic drugs: Secondary | ICD-10-CM | POA: Diagnosis not present

## 2021-10-01 DIAGNOSIS — N312 Flaccid neuropathic bladder, not elsewhere classified: Secondary | ICD-10-CM | POA: Diagnosis not present

## 2021-10-01 DIAGNOSIS — G8194 Hemiplegia, unspecified affecting left nondominant side: Secondary | ICD-10-CM | POA: Diagnosis not present

## 2021-10-01 DIAGNOSIS — K219 Gastro-esophageal reflux disease without esophagitis: Secondary | ICD-10-CM | POA: Diagnosis not present

## 2021-10-01 DIAGNOSIS — G20 Parkinson's disease: Secondary | ICD-10-CM | POA: Diagnosis not present

## 2021-10-06 ENCOUNTER — Other Ambulatory Visit: Payer: Self-pay | Admitting: *Deleted

## 2021-10-06 NOTE — Patient Outreach (Signed)
[facility]) Care Management  10/06/2021  Robert [name] Feb 26, 1946 [id number]   Outgoing call placed to member's wife, state this is not a good time to talk and will call back.  If no call back, will follow up within the next 3-4 business days.  [name], [place], MSN [place] [phone]

## 2021-10-09 ENCOUNTER — Other Ambulatory Visit: Payer: Self-pay | Admitting: *Deleted

## 2021-10-09 NOTE — Patient Outreach (Signed)
[facility]) Care Management  10/09/2021  Euell Schiff 11/12/1945 [id number]   Outreach attempt #2 to wife, state this is not a good time to talk.  Request made to call this care manager back when she is available.  If no call back, will send outreach letter and follow up within the next 3-4 business days.  [name], RN, MSN, [place] Manager [phone]

## 2021-10-10 ENCOUNTER — Encounter: Payer: Self-pay | Admitting: Physical Medicine & Rehabilitation

## 2021-10-10 ENCOUNTER — Other Ambulatory Visit: Payer: Self-pay

## 2021-10-10 ENCOUNTER — Encounter: Payer: Medicare Other | Attending: Physical Medicine & Rehabilitation | Admitting: Physical Medicine & Rehabilitation

## 2021-10-10 VITALS — BP 132/78 | HR 95 | Ht 74.0 in | Wt 219.0 lb

## 2021-10-10 DIAGNOSIS — G8110 Spastic hemiplegia affecting unspecified side: Secondary | ICD-10-CM | POA: Diagnosis not present

## 2021-10-10 DIAGNOSIS — I6522 Occlusion and stenosis of left carotid artery: Secondary | ICD-10-CM

## 2021-10-10 NOTE — Progress Notes (Signed)
Subjective:    Patient ID: Robert [name], male    DOB: Nov 04, 1945, 75 y.o.   MRN: [id number] 75 y.o. 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 Carlis Abbott. On evening post procedure, he was found to have confusion with right sided weakness, left gaze preference and speech deficits. MRI brain done revealing scattered embolic infarcts in left MCA territory.  Dr. Leonie Man felt stroke likely complication of TCAR and to continue DAPT with permissive hypertension and liberalize blood pressure up to 170.  Patient's verbal output was improving, right-sided weakness had resolved but he continued to have expressive deficits with right inattention.  Therapy evaluations revealed functional decline and CIR was recommended for follow-up therapy  Admit date: 05/15/2021 Discharge date: 05/28/2021 HPI  S/p Botox for LUE spasticity ~6wks ago Left Pec 50U, Biceps 50U,Brachialis 50  FCR 50, FDS 50 Left Brachiorad 50U   Finger flexors are still tight according to pt's wife  No falls  Still receiving OP PT as well as HHSLP , [facility] benefit Pain Inventory Average Pain 0 Pain Right Now 0 My pain is  no pain  LOCATION OF PAIN  no pain  BOWEL Number of stools per week: 4 Oral laxative use Yes  Type of laxative Miralax Enema or suppository use Yes  History of colostomy No  Incontinent No   BLADDER Normal In and out cath, frequency na Able to self cath  na Bladder incontinence No  Frequent urination No  Leakage with coughing No  Difficulty starting stream No  Incomplete bladder emptying No    Mobility walk with assistance use a walker how many minutes can you walk? 5 ability to climb steps?  no do you drive?  no use a wheelchair needs help with  transfers  Function disabled: date disabled . I need assistance with the following:  dressing, bathing, toileting, meal prep, household duties, and shopping  Neuro/Psych weakness trouble walking  Prior Studies Any changes since last visit?  no  Physicians involved in your care Any changes since last visit?  no   Family History  Problem Relation Age of Onset   Diabetes Sister    Lung cancer Brother        Post 9/11 voluntary work in [employer]   Prostate [place]    Other Mother        passed away young- non medical   Alzheimer's disease Father    Healthy Son    Social History   Socioeconomic History   Marital status: Married    Spouse name: [name]   Number of children: 1   Years of education: 12   Highest education level: High school graduate  Occupational History   Occupation: retired    Comment: Public librarian  Tobacco Use   Smoking status: Former    Years: 10.00    Types: Cigarettes    Quit date: 10/26/1970    Years since quitting: 50.9   Smokeless tobacco: Never  Vaping Use   Vaping Use: Never used  Substance and Sexual Activity   Alcohol use: No   Drug use: No   Sexual activity: Not on file  Other Topics Concern   Not on file  Social History Narrative   Lives in [place] with wife. He is a English as a second language teacher.   Has one son who lives in  [place]. He is healthy.   [facility] [place], [place].      Patient is right-handed. He lives with his wife in a one level home.   Social Determinants of Health   Financial Resource Strain: Not on file  Food Insecurity: Food Insecurity Present   Worried About [place] in the Last Year: Sometimes true   Ran Out of Food in the Last Year: Sometimes true  Transportation Needs: No Transportation Needs   Lack of Transportation (Medical): No   Lack of Transportation (Non-Medical): No  Physical Activity: Not on file  Stress: Not on file  Social Connections: Not on file   Past Surgical  History:  Procedure Laterality Date   CYSTOSCOPY WITH RETROGRADE URETHROGRAM N/A 10/23/2015   Procedure: CYSTOSCOPY WITH RETROGRADE URETHROGRAM;  Surgeon: [name], MD;  [facility];  Service: Urology;  Laterality: N/A;   CYSTOSCOPY WITH URETHRAL DILATATION N/A 10/23/2015   Procedure: CYSTOSCOPY WITH URETHRAL BALLOON DILATATION;  Surgeon: [name], MD;  Location: [facility];  Service: Urology;  Laterality: N/A;  BALLOON DILATION    IVC FILTER PLACEMENT ([place] HX)  2008   LAPAROSCOPIC CHOLECYSTECTOMY SINGLE PORT N/A 01/09/2021   Procedure: LAPAROSCOPIC CHOLECYSTECTOMY WITH IOC;  Surgeon: [name], MD;  Location: [facility];  Service: General;  Laterality: N/A;  90 MIN   TRANSCAROTID ARTERY REVASCULARIZATION  Left 05/12/2021   Procedure: LEFT TRANSCAROTID ARTERY REVASCULARIZATION;  Surgeon: [name], MD;  Location: [place];  Service: Vascular;  Laterality: Left;   Past Medical History:  Diagnosis Date   Anemia    BPH (benign prostatic hyperplasia)    CVA (cerebral vascular accident) ([place])    with left sided hemiparesis   Diabetes mellitus without complication ([place])    Diverticulosis    Frequency of urination    GERD (gastroesophageal reflux disease)    Gross hematuria    History of acute pyelonephritis    10-13-2012   History of CVA with residual deficit    2008--  left side of body weakness and foot drop (wears leg brace and uses cane)   History of DVT of lower extremity    2008--  cva   Hypercholesteremia    Hyperlipidemia    Hyperlipidemia    Hypertension    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 2008   Left leg DVT ([place])    Neuromuscular disorder ([place])    Parkinsons   S/P insertion of IVC (inferior vena caval) filter    2008   Urethral stricture    Urgency of urination    Urinary retention    Weakness of left side of body    secondary to cva 2008   Wears glasses    Wears hearing aid    bilateral-- wears  intermittantly   BP 132/78    Pulse 95    Ht 6\' 2"  (1.88 m)    Wt 219 lb (99.3 kg)    SpO2 97%    BMI 28.12 kg/m   Opioid Risk Score:   Fall Risk Score:  `1  Depression screen PHQ 2/9  Depression screen [facility] 2/9 08/26/2021 07/08/2021 02/20/2020  Decreased Interest 1 1 (No Data)  Down, Depressed, Hopeless 1 1 (No Data)  PHQ - 2 Score 2 2 -  Altered sleeping - 0 -  Tired, decreased energy - 2 -  Change in appetite - 0 -  Feeling bad or failure about yourself  -  0 -  Trouble concentrating - 0 -  Moving slowly or fidgety/restless - 1 -  Suicidal thoughts - 0 -  PHQ-9 Score - 5 -  Difficult doing work/chores - Somewhat difficult -     Review of Systems  Musculoskeletal:  Positive for gait problem.  Neurological:  Positive for weakness.  All other systems reviewed and are negative.     Objective:   Physical Exam Vitals and nursing note reviewed.  Constitutional:      Appearance: He is normal weight.  HENT:     Head: Normocephalic and atraumatic.  Eyes:     Extraocular Movements: Extraocular movements intact.     Conjunctiva/sclera: Conjunctivae normal.     Pupils: Pupils are equal, round, and reactive to light.  Musculoskeletal:        General: No tenderness.     Cervical back: Normal range of motion.     Comments: No pain with LUE ROM but Left shoulder has reduced ROM ~75% of normal   Skin:    General: Skin is warm and dry.  Neurological:     Mental Status: He is alert.     Comments: Trace shoulder elevation otherwise 0/5 in LUE LLE 3- Hip / knee extensor synergy . O/w 0/5 Wears L double metal upright AFO  Psychiatric:        Mood and Affect: Mood normal.        Behavior: Behavior normal.   Tone MAS 0 at the elbow flexor  3 at the thumb flexor 3 finger flexors      Assessment & Plan:  1.  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  Repeat in around 7 weeks Left Pec 75U, Biceps 50U,Brachialis 50 , FPL 25U , FDP 50U  FCR 50, FDS 50,  Left Brachiorad 50U

## 2021-10-10 NOTE — Patient Instructions (Signed)
Plan to increase botox next visit to address finger and thumb flexor spasms

## 2021-10-14 ENCOUNTER — Other Ambulatory Visit: Payer: Self-pay | Admitting: *Deleted

## 2021-10-14 NOTE — Patient Outreach (Signed)
[facility]) Care Management  10/14/2021  Robert [name] 06-01-1946 [id number]   Outreach attempt #3, unsuccessful, HIPAA compliant voice message left.  Will make 4th and final attempt within the next 4 weeks, if remain unsuccessful will close case due to inability to maintain contact.  [name], [place], MSN [place] [phone]

## 2021-10-31 DIAGNOSIS — G20 Parkinson's disease: Secondary | ICD-10-CM | POA: Diagnosis not present

## 2021-10-31 DIAGNOSIS — K219 Gastro-esophageal reflux disease without esophagitis: Secondary | ICD-10-CM | POA: Diagnosis not present

## 2021-10-31 DIAGNOSIS — I639 Cerebral infarction, unspecified: Secondary | ICD-10-CM | POA: Diagnosis not present

## 2021-10-31 DIAGNOSIS — N312 Flaccid neuropathic bladder, not elsewhere classified: Secondary | ICD-10-CM | POA: Diagnosis not present

## 2021-10-31 DIAGNOSIS — G8194 Hemiplegia, unspecified affecting left nondominant side: Secondary | ICD-10-CM | POA: Diagnosis not present

## 2021-11-06 ENCOUNTER — Other Ambulatory Visit: Payer: Self-pay | Admitting: *Deleted

## 2021-11-06 NOTE — Patient Outreach (Signed)
[facility]) Care Management  11/06/2021  Robert [name] 1946-08-22 [id number]   Outreach attempt #4, unsuccessful, HIPAA compliant voice message left.  No response from member after multiple unsuccessful outreach attempts and letter sent.  Will close case at this time due to inability to maintain contact.  Will notify member and primary MD of case closure.  [name], RN, MSN, [place] Manager [phone]

## 2021-11-07 ENCOUNTER — Ambulatory Visit: Payer: Self-pay | Admitting: *Deleted

## 2021-11-25 ENCOUNTER — Ambulatory Visit: Payer: Medicare Other | Admitting: Adult Health

## 2021-11-27 DIAGNOSIS — N312 Flaccid neuropathic bladder, not elsewhere classified: Secondary | ICD-10-CM | POA: Diagnosis not present

## 2021-11-27 DIAGNOSIS — G8194 Hemiplegia, unspecified affecting left nondominant side: Secondary | ICD-10-CM | POA: Diagnosis not present

## 2021-11-27 DIAGNOSIS — G20 Parkinson's disease: Secondary | ICD-10-CM | POA: Diagnosis not present

## 2021-11-27 DIAGNOSIS — K219 Gastro-esophageal reflux disease without esophagitis: Secondary | ICD-10-CM | POA: Diagnosis not present

## 2021-11-27 DIAGNOSIS — I639 Cerebral infarction, unspecified: Secondary | ICD-10-CM | POA: Diagnosis not present

## 2021-12-04 ENCOUNTER — Encounter: Payer: Medicare Other | Attending: Physical Medicine & Rehabilitation | Admitting: Physical Medicine & Rehabilitation

## 2021-12-04 ENCOUNTER — Encounter: Payer: Self-pay | Admitting: Physical Medicine & Rehabilitation

## 2021-12-04 ENCOUNTER — Other Ambulatory Visit: Payer: Self-pay

## 2021-12-04 VITALS — BP 152/78 | HR 92 | Ht 74.0 in | Wt 218.6 lb

## 2021-12-04 DIAGNOSIS — G8114 Spastic hemiplegia affecting left nondominant side: Secondary | ICD-10-CM | POA: Diagnosis not present

## 2021-12-04 DIAGNOSIS — G8110 Spastic hemiplegia affecting unspecified side: Secondary | ICD-10-CM | POA: Diagnosis not present

## 2021-12-04 NOTE — Patient Instructions (Signed)

## 2021-12-04 NOTE — Progress Notes (Signed)
Botox Injection for spasticity using needle EMG guidance  Dilution: 50 units/ml Indication: Severe spasticity which interferes with ADL,mobility and/or  hygiene and is unresponsive to medication management and other conservative care Informed consent was obtained after describing risks and benefits of the procedure with the patient. This includes bleeding, bruising, infection, excessive weakness, or medication side effects. A REMS form is on file and signed. Needle: 27-gauge 1 inch needle electrode Number of units per muscle Left Pec 75U, Biceps 50U,Brachialis 50 , FPL 25U , FDP 50U  FCR 50, FDS 50,  Left Brachiorad 50U  All injections were done after obtaining appropriate EMG activity and after negative drawback for blood. The patient tolerated the procedure well. Post procedure instructions were given. A followup appointment was made.

## 2021-12-10 ENCOUNTER — Telehealth: Payer: Self-pay | Admitting: Physical Medicine & Rehabilitation

## 2021-12-10 DIAGNOSIS — R252 Cramp and spasm: Secondary | ICD-10-CM

## 2021-12-10 DIAGNOSIS — I69398 Other sequelae of cerebral infarction: Secondary | ICD-10-CM

## 2021-12-10 NOTE — Telephone Encounter (Signed)
[name] came into office asking if I could give her information about physical therapy referral. I did not see one in the system. Please clarify.

## 2021-12-10 NOTE — Telephone Encounter (Signed)
Left voicemail to inform patient that PT referral has been put in

## 2022-01-16 DIAGNOSIS — G8194 Hemiplegia, unspecified affecting left nondominant side: Secondary | ICD-10-CM | POA: Diagnosis not present

## 2022-01-16 DIAGNOSIS — G20 Parkinson's disease: Secondary | ICD-10-CM | POA: Diagnosis not present

## 2022-01-16 DIAGNOSIS — N312 Flaccid neuropathic bladder, not elsewhere classified: Secondary | ICD-10-CM | POA: Diagnosis not present

## 2022-01-16 DIAGNOSIS — I639 Cerebral infarction, unspecified: Secondary | ICD-10-CM | POA: Diagnosis not present

## 2022-01-16 DIAGNOSIS — K219 Gastro-esophageal reflux disease without esophagitis: Secondary | ICD-10-CM | POA: Diagnosis not present

## 2022-01-22 ENCOUNTER — Encounter: Payer: Self-pay | Admitting: Physical Medicine & Rehabilitation

## 2022-01-22 ENCOUNTER — Encounter: Payer: Medicare Other | Attending: Physical Medicine & Rehabilitation | Admitting: Physical Medicine & Rehabilitation

## 2022-01-22 VITALS — BP 150/72 | HR 89 | Ht 74.0 in

## 2022-01-22 DIAGNOSIS — R252 Cramp and spasm: Secondary | ICD-10-CM | POA: Diagnosis not present

## 2022-01-22 DIAGNOSIS — I69398 Other sequelae of cerebral infarction: Secondary | ICD-10-CM

## 2022-01-22 NOTE — Progress Notes (Signed)
? ?Subjective:  ? ? Patient ID: Robert [name], male    DOB: 1946-03-24, 76 y.o.   MRN: [id number] ? ?HPI ?Improvement in spasticity following  ?12/04/2021 ?Left Pec 75U, Biceps 50U,Brachialis 50 , FPL 25U , FDP 50U  FCR 50, FDS 50,  ?Left Brachiorad 50U ? ? ?Pain Inventory ?Average Pain 0 ?Pain Right Now 0 ?My pain is  no pain ? ?LOCATION OF PAIN  no pain ? ?BOWEL ?Number of stools per week: 14 plus a week ?Oral laxative use  miralax when needed ? ?BLADDER ?Foley ? ? ?Mobility ?ability to climb steps?  no ?do you drive?  no ?use a wheelchair ?needs help with transfers ?Do you have any goals in this area?  yes ? ?Function ?retired ?I need assistance with the following:  dressing, bathing, toileting, meal prep, household duties, and shopping ?Do you have any goals in this area?  yes ? ?Neuro/Psych ?bladder control problems ?weakness ?numbness ?tremor ?trouble walking ?anxiety ? ?Prior Studies ?Any changes since last visit?  yes [facility] ? ?Physicians involved in your care ?Any changes since last visit?  yes at the [place] ? ? ?Family History  ?Problem Relation Age of Onset  ? Diabetes Sister   ? Lung cancer Brother   ?     Post 9/11 voluntary work in [employer]  ? Prostate cancer Brother   ? Other Mother   ?     passed away young- non medical  ? Alzheimer's disease Father   ? Healthy Son   ? ?Social History  ? ?Socioeconomic History  ? Marital status: Married  ?  Spouse name: [name]  ? Number of children: 1  ? Years of education: 21  ? Highest education level: High school graduate  ?Occupational History  ? Occupation: retired  ?  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 Smoking status: Former  ?  Years: 10.00  ?  Types: Cigarettes  ?  Quit date: 10/26/1970  ?  Years since quitting: 51.2  ? Smokeless tobacco: Never  ?Vaping Use  ? Vaping Use: Never used  ?Substance and Sexual Activity  ? Alcohol use: No  ? Drug use: No  ? Sexual activity: Not on file  ?Other Topics Concern  ? Not on file  ?Social History  Narrative  ? Lives in [place] with wife. He is a English as a second language teacher.  ? Has one son who lives in [place]. He is healthy.  ? Follows with [facility] for his medical care- [place], [place].  ?   ? Patient is right-handed. He lives with his wife in a one level home.  ? ?Social Determinants of Health  ? ?Financial Resource Strain: Not on file  ?Food Insecurity: Food Insecurity Present  ? Worried About Charity fundraiser in the Last Year: Sometimes true  ? Ran Out of Food in the Last Year: Sometimes true  ?Transportation Needs: No Transportation Needs  ? Lack of Transportation (Medical): No  ? Lack of Transportation (Non-Medical): No  ?Physical Activity: Not on file  ?Stress: Not on file  ?Social Connections: Not on file  ? ?Past Surgical History:  ?Procedure Laterality Date  ? CYSTOSCOPY WITH RETROGRADE URETHROGRAM N/A 10/23/2015  ? Procedure: CYSTOSCOPY WITH RETROGRADE URETHROGRAM;  Surgeon: [name], MD;  Location: [facility];  Service: Urology;  Laterality: N/A;  ? CYSTOSCOPY WITH URETHRAL DILATATION N/A 10/23/2015  ? Procedure: CYSTOSCOPY WITH URETHRAL BALLOON DILATATION;  Surgeon: [name], MD;  Location[facility];  Service: Urology;  Laterality: N/A;  BALLOON DILATION ?  ? IVC FILTER PLACEMENT ([place] HX)  2008  ? LAPAROSCOPIC CHOLECYSTECTOMY SINGLE PORT N/A 01/09/2021  ? Procedure: LAPAROSCOPIC CHOLECYSTECTOMY WITH IOC;  Surgeon: [name], MD;  [facility];  Service: General;  Laterality: N/A;  90 MIN  ? TRANSCAROTID ARTERY REVASCULARIZATION?  Left 05/12/2021  ? Procedure: LEFT TRANSCAROTID ARTERY REVASCULARIZATION;  Surgeon: [name], MD;  Location: [facility];  Service: Vascular;  Laterality: Left;  ? ?Past Medical History:  ?Diagnosis Date  ? Anemia   ? BPH (benign prostatic hyperplasia)   ? 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 Diabetes mellitus without complication ([place])   ? Diverticulosis   ? Frequency of urination   ? GERD  (gastroesophageal reflux disease)   ? Gross hematuria   ? History of acute pyelonephritis   ? 10-13-2012  ? History of CVA with residual deficit   ? 2008--  left side of body weakness and foot drop (wears leg brace and uses cane)  ? History of DVT of lower extremity   ? 2008--  cva  ? Hypercholesteremia   ? Hyperlipidemia   ? Hyperlipidemia   ? Hypertension   ? Left foot drop   ? secondary to cva 2008  ? Left leg DVT ([place])   ? Neuromuscular disorder ([place])   ? Parkinsons  ? S/P insertion of IVC (inferior vena caval) filter   ? 2008  ? Urethral stricture   ? Urgency of urination   ? Urinary retention   ? Weakness of left side of body   ? secondary to cva 2008  ? Wears glasses   ? Wears hearing aid   ? bilateral-- wears intermittantly  ? ?BP (!) 150/72   Pulse 89   Ht '6\' 2"'$  (1.88 m)   BMI 28.07 kg/m?  ? ?Opioid Risk Score:   ?Fall Risk Score:  `1 ? ?Depression screen PHQ 2/9 ? ? ?  12/04/2021  ? 10:14 AM 08/26/2021  ? 12:52 PM 07/08/2021  ? 12:13 PM  ?Depression screen PHQ 2/9  ?Decreased Interest '1 1 1  '$ ?Down, Depressed, Hopeless '1 1 1  '$ ?PHQ - 2 Score '2 2 2  '$ ?Altered sleeping   0  ?Tired, decreased energy   2  ?Change in appetite   0  ?Feeling bad or failure about yourself    0  ?Trouble concentrating   0  ?Moving slowly or fidgety/restless   1  ?Suicidal thoughts   0  ?PHQ-9 Score   5  ?Difficult doing work/chores   Somewhat difficult  ?  ?Review of Systems  ?Genitourinary:   ?     FOLEY CATH  ?Musculoskeletal:  Positive for gait problem.  ?Neurological:  Positive for tremors and weakness. Negative for numbness.  ?Psychiatric/Behavioral:    ?     ANXIETY  ?All other systems reviewed and are negative. ? ?   ?Objective:  ? Physical Exam ?Vitals reviewed.  ?Constitutional:   ?   Appearance: He is obese.  ?HENT:  ?   Head: Normocephalic and atraumatic.  ?Eyes:  ?   Extraocular Movements: Extraocular movements intact.  ?   Conjunctiva/sclera: Conjunctivae normal.  ?   Pupils: Pupils are equal, round, and reactive to light.   ?Neurological:  ?   Mental Status: He is alert and oriented to person, place, and time.  ?   Comments: Tone MAS 3 at Left shoulder , MAS 1  at elbow wrist and finger flexors  ? ? ?Motor 2- Left delt Bi, , 0/5 finger and wrist flex and ext ? ? ?   ?Assessment & Plan:  ? 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 ?

## 2022-01-22 NOTE — Patient Instructions (Signed)
Please wear hand splint every night  ?

## 2022-02-08 ENCOUNTER — Emergency Department (HOSPITAL_COMMUNITY): Payer: Medicare Other

## 2022-02-08 ENCOUNTER — Inpatient Hospital Stay (HOSPITAL_COMMUNITY)
Admission: EM | Admit: 2022-02-08 | Discharge: 2022-02-12 | DRG: 699 | Disposition: A | Discharge status: Home Health | Payer: Medicare Other | Attending: Internal Medicine | Admitting: Internal Medicine

## 2022-02-08 ENCOUNTER — Other Ambulatory Visit: Payer: Self-pay

## 2022-02-08 DIAGNOSIS — Z7984 Long term (current) use of oral hypoglycemic drugs: Secondary | ICD-10-CM

## 2022-02-08 DIAGNOSIS — Z7982 Long term (current) use of aspirin: Secondary | ICD-10-CM

## 2022-02-08 DIAGNOSIS — D638 Anemia in other chronic diseases classified elsewhere: Secondary | ICD-10-CM | POA: Diagnosis present

## 2022-02-08 DIAGNOSIS — L89312 Pressure ulcer of right buttock, stage 2: Secondary | ICD-10-CM | POA: Diagnosis present

## 2022-02-08 DIAGNOSIS — Z86718 Personal history of other venous thrombosis and embolism: Secondary | ICD-10-CM

## 2022-02-08 DIAGNOSIS — Z978 Presence of other specified devices: Secondary | ICD-10-CM | POA: Diagnosis not present

## 2022-02-08 DIAGNOSIS — E119 Type 2 diabetes mellitus without complications: Secondary | ICD-10-CM | POA: Diagnosis present

## 2022-02-08 DIAGNOSIS — Z833 Family history of diabetes mellitus: Secondary | ICD-10-CM | POA: Diagnosis not present

## 2022-02-08 DIAGNOSIS — E7800 Pure hypercholesterolemia, unspecified: Secondary | ICD-10-CM | POA: Diagnosis present

## 2022-02-08 DIAGNOSIS — Z95828 Presence of other vascular implants and grafts: Secondary | ICD-10-CM

## 2022-02-08 DIAGNOSIS — G20 Parkinson's disease: Secondary | ICD-10-CM | POA: Diagnosis present

## 2022-02-08 DIAGNOSIS — Z87891 Personal history of nicotine dependence: Secondary | ICD-10-CM

## 2022-02-08 DIAGNOSIS — I10 Essential (primary) hypertension: Secondary | ICD-10-CM | POA: Diagnosis not present

## 2022-02-08 DIAGNOSIS — T83518A Infection and inflammatory reaction due to other urinary catheter, initial encounter: Principal | ICD-10-CM | POA: Diagnosis present

## 2022-02-08 DIAGNOSIS — Z79899 Other long term (current) drug therapy: Secondary | ICD-10-CM

## 2022-02-08 DIAGNOSIS — Y846 Urinary catheterization as the cause of abnormal reaction of the patient, or of later complication, without mention of misadventure at the time of the procedure: Secondary | ICD-10-CM | POA: Diagnosis present

## 2022-02-08 DIAGNOSIS — R531 Weakness: Secondary | ICD-10-CM | POA: Diagnosis not present

## 2022-02-08 DIAGNOSIS — A419 Sepsis, unspecified organism: Principal | ICD-10-CM | POA: Diagnosis present

## 2022-02-08 DIAGNOSIS — I160 Hypertensive urgency: Secondary | ICD-10-CM | POA: Diagnosis present

## 2022-02-08 DIAGNOSIS — Z7401 Bed confinement status: Secondary | ICD-10-CM | POA: Diagnosis not present

## 2022-02-08 DIAGNOSIS — Z974 Presence of external hearing-aid: Secondary | ICD-10-CM

## 2022-02-08 DIAGNOSIS — N390 Urinary tract infection, site not specified: Secondary | ICD-10-CM | POA: Diagnosis not present

## 2022-02-08 DIAGNOSIS — Z888 Allergy status to other drugs, medicaments and biological substances status: Secondary | ICD-10-CM

## 2022-02-08 DIAGNOSIS — N10 Acute tubulo-interstitial nephritis: Secondary | ICD-10-CM | POA: Diagnosis not present

## 2022-02-08 DIAGNOSIS — E876 Hypokalemia: Secondary | ICD-10-CM | POA: Diagnosis present

## 2022-02-08 DIAGNOSIS — G20A1 Parkinson's disease without dyskinesia, without mention of fluctuations: Secondary | ICD-10-CM | POA: Diagnosis present

## 2022-02-08 DIAGNOSIS — N35919 Unspecified urethral stricture, male, unspecified site: Secondary | ICD-10-CM | POA: Diagnosis present

## 2022-02-08 DIAGNOSIS — Z7901 Long term (current) use of anticoagulants: Secondary | ICD-10-CM

## 2022-02-08 DIAGNOSIS — F32A Depression, unspecified: Secondary | ICD-10-CM | POA: Diagnosis present

## 2022-02-08 DIAGNOSIS — K219 Gastro-esophageal reflux disease without esophagitis: Secondary | ICD-10-CM | POA: Diagnosis present

## 2022-02-08 DIAGNOSIS — E872 Acidosis, unspecified: Secondary | ICD-10-CM | POA: Diagnosis present

## 2022-02-08 DIAGNOSIS — R0689 Other abnormalities of breathing: Secondary | ICD-10-CM | POA: Diagnosis not present

## 2022-02-08 DIAGNOSIS — N12 Tubulo-interstitial nephritis, not specified as acute or chronic: Secondary | ICD-10-CM | POA: Diagnosis present

## 2022-02-08 DIAGNOSIS — T83511A Infection and inflammatory reaction due to indwelling urethral catheter, initial encounter: Secondary | ICD-10-CM | POA: Diagnosis not present

## 2022-02-08 DIAGNOSIS — R509 Fever, unspecified: Secondary | ICD-10-CM | POA: Diagnosis not present

## 2022-02-08 DIAGNOSIS — Z882 Allergy status to sulfonamides status: Secondary | ICD-10-CM

## 2022-02-08 DIAGNOSIS — R5383 Other fatigue: Secondary | ICD-10-CM | POA: Diagnosis not present

## 2022-02-08 DIAGNOSIS — Z820 Family history of epilepsy and other diseases of the nervous system: Secondary | ICD-10-CM | POA: Diagnosis not present

## 2022-02-08 DIAGNOSIS — L8990 Pressure ulcer of unspecified site, unspecified stage: Secondary | ICD-10-CM | POA: Diagnosis present

## 2022-02-08 DIAGNOSIS — N400 Enlarged prostate without lower urinary tract symptoms: Secondary | ICD-10-CM | POA: Diagnosis present

## 2022-02-08 DIAGNOSIS — I69354 Hemiplegia and hemiparesis following cerebral infarction affecting left non-dominant side: Secondary | ICD-10-CM | POA: Diagnosis not present

## 2022-02-08 DIAGNOSIS — Z8042 Family history of malignant neoplasm of prostate: Secondary | ICD-10-CM | POA: Diagnosis not present

## 2022-02-08 DIAGNOSIS — Z801 Family history of malignant neoplasm of trachea, bronchus and lung: Secondary | ICD-10-CM | POA: Diagnosis not present

## 2022-02-08 DIAGNOSIS — F0283 Dementia in other diseases classified elsewhere, unspecified severity, with mood disturbance: Secondary | ICD-10-CM | POA: Diagnosis present

## 2022-02-08 DIAGNOSIS — E785 Hyperlipidemia, unspecified: Secondary | ICD-10-CM | POA: Diagnosis present

## 2022-02-08 DIAGNOSIS — R000 Tachycardia, unspecified: Secondary | ICD-10-CM | POA: Diagnosis not present

## 2022-02-08 LAB — COMPREHENSIVE METABOLIC PANEL
ALT: 9 U/L (ref 0–44)
AST: 7 U/L — ABNORMAL LOW (ref 15–41)
Albumin: 3.7 g/dL (ref 3.5–5.0)
Alkaline Phosphatase: 82 U/L (ref 38–126)
Anion gap: 8 (ref 5–15)
BUN: 18 mg/dL (ref 8–23)
CO2: 27 mmol/L (ref 22–32)
Calcium: 9 mg/dL (ref 8.9–10.3)
Chloride: 102 mmol/L (ref 98–111)
Creatinine, Ser: 1.11 mg/dL (ref 0.61–1.24)
GFR, Estimated: >60 mL/min (ref >60)
Glucose, Bld: 148 mg/dL — ABNORMAL HIGH (ref 70–99)
Potassium: 3.4 mmol/L — ABNORMAL LOW (ref 3.5–5.1)
Sodium: 137 mmol/L (ref 135–145)
Total Bilirubin: 0.9 mg/dL (ref 0.3–1.2)
Total Protein: 7.2 g/dL (ref 6.5–8.1)

## 2022-02-08 LAB — URINALYSIS, ROUTINE W REFLEX MICROSCOPIC
Bacteria, UA: NONE SEEN
Bilirubin Urine: NEGATIVE
Glucose, UA: NEGATIVE mg/dL
Ketones, ur: 5 mg/dL — AB
Nitrite: NEGATIVE
Protein, ur: 100 mg/dL — AB
Specific Gravity, Urine: 1.016 (ref 1.005–1.030)
WBC, UA: >50 WBC/hpf — ABNORMAL HIGH (ref 0–5)
pH: 8 (ref 5.0–8.0)

## 2022-02-08 LAB — CBC WITH DIFFERENTIAL/PLATELET
Abs Immature Granulocytes: 0.04 10*3/uL (ref 0.00–0.07)
Basophils Absolute: 0 10*3/uL (ref 0.0–0.1)
Basophils Relative: 0 %
Eosinophils Absolute: 0.1 10*3/uL (ref 0.0–0.5)
Eosinophils Relative: 1 %
HCT: 47.1 % (ref 39.0–52.0)
Hemoglobin: 15.2 g/dL (ref 13.0–17.0)
Immature Granulocytes: 0 %
Lymphocytes Relative: 9 %
Lymphs Abs: 0.9 10*3/uL (ref 0.7–4.0)
MCH: 29.5 pg (ref 26.0–34.0)
MCHC: 32.3 g/dL (ref 30.0–36.0)
MCV: 91.5 fL (ref 80.0–100.0)
Monocytes Absolute: 1.2 10*3/uL — ABNORMAL HIGH (ref 0.1–1.0)
Monocytes Relative: 11 %
Neutro Abs: 8.3 10*3/uL — ABNORMAL HIGH (ref 1.7–7.7)
Neutrophils Relative %: 79 %
Platelets: 290 10*3/uL (ref 150–400)
RBC: 5.15 MIL/uL (ref 4.22–5.81)
RDW: 13.4 % (ref 11.5–15.5)
WBC: 10.5 10*3/uL (ref 4.0–10.5)
nRBC: 0 % (ref 0.0–0.2)

## 2022-02-08 LAB — LACTIC ACID, PLASMA
Lactic Acid, Venous: 2.6 mmol/L (ref 0.5–1.9)
Lactic Acid, Venous: 2.7 mmol/L (ref 0.5–1.9)

## 2022-02-08 LAB — TSH: TSH: 0.94 u[IU]/mL (ref 0.350–4.500)

## 2022-02-08 MED ORDER — SODIUM CHLORIDE 0.9 % IV SOLN
1.0000 g | INTRAVENOUS | Status: DC
Start: 2022-02-08 — End: 2022-02-11
  Administered 2022-02-09 – 2022-02-10 (×3): 1 g via INTRAVENOUS
  Filled 2022-02-08 (×4): qty 10

## 2022-02-08 MED ORDER — LABETALOL HCL 5 MG/ML IV SOLN
10.0000 mg | Freq: Once | INTRAVENOUS | Status: AC
  Administered 2022-02-08: 10 mg via INTRAVENOUS
  Filled 2022-02-08: qty 4

## 2022-02-08 MED ORDER — LACTATED RINGERS IV SOLN: INTRAVENOUS | Status: DC

## 2022-02-08 NOTE — ED Triage Notes (Signed)
Patient Robert [name] , patient has history of parkinsons, lives with wife, wife says patient has been "shaking" more than baseline, wife checked patients bp and it was high, patient has no history of HTN. Patient denies pain in triage  ?

## 2022-02-08 NOTE — ED Provider Notes (Signed)
?[facility] DEPT ?Provider Note ? ? ?CSN: [id number] ?Arrival date & time: 02/08/22  1750 ? ?  ? ?History ? ?Chief Complaint  ?Patient presents with  ? Hypertension  ? ? ?Robert [name] is a 76 y.o. male. ? ?76 year old male presents from home with hypertension.  Has no prior history of this.  States that his wife checks his blood pressure periodically he was found to have a systolic blood pressure of 200.  He denies any chest pain or shortness of breath.  No abdominal discomfort.  No syncope or near syncope.  No recent medication changes.  Called EMS and was transported here ? ? ?  ? ?Home Medications ?Prior to Admission medications   ?Medication Sig Start Date End Date Taking? Authorizing Provider  ?amoxicillin (AMOXIL) 500 MG capsule TAKE ONE CAPSULE BY MOUTH ONCE 01/07/22   [provider]  ?apixaban (ELIQUIS) 5 MG TABS tablet Take 1 tablet (5 mg total) by mouth 2 (two) times daily. 05/27/21   Bary Leriche, PA-C  ?aspirin EC 81 MG tablet Take 81 mg by mouth daily. Swallow whole.    [provider]  ?benzonatate (TESSALON) 100 MG capsule TAKE ONE CAPSULE BY MOUTH TWICE A DAY AS NEEDED FOR COUGH 10/01/21   [provider]  ?Blood Glucose Monitoring Suppl ([place]) w/Device KIT check blood sugar 11/25/20   [provider]  ?busPIRone (BUSPAR) 5 MG tablet TAKE TWO TABLETS BY MOUTH IN THE MORNING AND TAKE ONE AND ONE-HALF TABLETS AT NOON AND TAKE ONE AND ONE-HALF TABLETS AT BEDTIME FOR SEVERE ANXIETY FOR ANXIETY ?Patient not taking: Reported on 01/22/2022 11/20/21   [provider]  ?busPIRone (BUSPAR) 7.5 MG tablet Take 11.25 mg by mouth 2 (two) times daily. ?Patient not taking: Reported on 01/22/2022    [provider]  ?carbidopa-levodopa (SINEMET IR) 25-100 MG tablet TAKE 2 1/2 TABLETS BY MOUTH TWICE A DAY AND TAKE 1 TABLET ONCE A DAY WITH SUPPER 08/14/21   [provider]  ?Carbidopa-Levodopa ER (SINEMET CR)  25-100 MG tablet controlled release TAKE 2 1/2 TABLETS BY MOUTH TWICE A DAY AND TAKE 1 TABLET ONCE A DAY WITH SUPPER - NOT A DOSE CHANGE INCREASED FROM 2-2-1 TABLET, STILL BEST TAKEN AT 9, 1, 5. 12/18/21   [provider]  ?Cholecalciferol 50 MCG (2000 UT) TABS TAKE ONE TABLET BY MOUTH DAILY FOR LOW VITAMIN D3 12/23/21   [provider]  ?clotrimazole-betamethasone (LOTRISONE) cream 1 application 26/3/78   [provider]  ?emollient cream APPLY MODERATE AMOUNT TO AFFECTED AREA DAILY FOR THE BODY 11/06/21   [provider]  ?erythromycin ophthalmic ointment APPLY THIN RIBBON TO LEFT EYE DAILY FOR CONJUCTIVITIS 01/09/22   [provider]  ?finasteride (PROSCAR) 5 MG tablet TAKE ONE TABLET BY MOUTH DAILY FOR PROSTATE 01/07/22   [provider]  ?Fluocinolone Acetonide Body 0.01 % OIL APPLY THIN FILM TO AFFECTED AREA AS DIRECTED APPLY TO THE SCALP 1-2 TIMES DAILY AS NEEDED FOR ITCHING 11/06/21   [provider]  ?hydrOXYzine (ATARAX) 10 MG tablet See admin instructions.    [provider]  ?lidocaine (LIDODERM) 5 % 1 patch remove after 12 hours ?Patient not taking: Reported on 01/22/2022    [provider]  ?memantine (NAMENDA) 10 MG tablet Take 10 mg by mouth 2 (two) times daily.    [provider]  ?metFORMIN (GLUCOPHAGE) 500 MG tablet Take 500 mg by mouth at bedtime.    [provider]  ?  mirabegron ER (MYRBETRIQ) 25 MG TB24 tablet 1 tablet    [provider]  ?nystatin (MYCOSTATIN/NYSTOP) powder Apply 1 application topically 2 (two) times daily.    [provider]  ?nystatin-triamcinolone ointment (MYCOLOG) APPLY THIN FILM TO AFFECTED AREA TWICE A DAY 10/26/21   [provider]  ?omeprazole (PRILOSEC) 20 MG capsule Take 20 mg by mouth daily.    [provider]  ?ondansetron (ZOFRAN) 8 MG tablet 1/2 to 1 tablet 09/23/21   [provider]  ?Pimavanserin Tartrate (NUPLAZID) 34 MG CAPS 1  capsule    [provider]  ?polyethylene glycol (MIRALAX / GLYCOLAX) 17 g packet 1 packet mixed with 8 ounces of fluid 05/12/17   [provider]  ?rasagiline (AZILECT) 1 MG TABS tablet Take 1 mg by mouth daily.    [provider]  ?sertraline (ZOLOFT) 50 MG tablet TAKE ONE-HALF TABLET BY MOUTH DAILY FOR MENTAL HEALTH 12/30/21   [provider]  ?simvastatin (ZOCOR) 20 MG tablet 1 tablet every evening 09/05/15   [provider]  ?simvastatin (ZOCOR) 40 MG tablet Take 40 mg by mouth daily.    [provider]  ?tacrolimus (PROTOPIC) 0.1 % ointment APPLY AS DIRECTED TO AFFECTED AREA TWICE A DAY AS NEEDED TO THE GROIN, ARMPIT, AND BOTTOM WHEN SKIN IS BROKEN OUT 11/06/21   [provider]  ?   ? ?Allergies    ?Propofol, Lisinopril, and Sulfamethoxazole-trimethoprim   ? ?Review of Systems   ?Review of Systems  ?All other systems reviewed and are negative. ? ?Physical Exam ?Updated Vital Signs ?BP (!) 187/120 (BP Location: Left Arm)   Pulse (!) 130   Temp 99 ?F (37.2 ?C) (Oral)   Resp (!) 28   Ht 1.88 m (_0 )   Wt 98.9 kg   SpO2 98%   BMI 27.99 kg/m?  ?Physical Exam ?Vitals and nursing note reviewed.  ?Constitutional:   ?   General: He is not in acute distress. ?   Appearance: Normal appearance. He is well-developed. He is not toxic-appearing.  ?HENT:  ?   Head: Normocephalic and atraumatic.  ?Eyes:  ?   General: Lids are normal.  ?   Conjunctiva/sclera: Conjunctivae normal.  ?   Pupils: Pupils are equal, round, and reactive to light.  ?Neck:  ?   Thyroid: No thyroid mass.  ?   Trachea: No tracheal deviation.  ?Cardiovascular:  ?   Rate and Rhythm: Regular rhythm. Tachycardia present.  ?   Heart sounds: Normal heart sounds. No murmur heard. ?  No gallop.  ?Pulmonary:  ?   Effort: Pulmonary effort is normal. No respiratory distress.  ?   Breath sounds: Normal breath sounds. No stridor. No decreased breath sounds, wheezing, rhonchi or rales.  ?Abdominal:  ?    General: There is no distension.  ?   Palpations: Abdomen is soft.  ?   Tenderness: There is no abdominal tenderness. There is no rebound.  ?Musculoskeletal:     ?   General: No tenderness. Normal range of motion.  ?   Cervical back: Normal range of motion and neck supple.  ?Skin: ?   General: Skin is warm and dry.  ?   Findings: No abrasion or rash.  ?Neurological:  ?   Mental Status: He is alert and oriented to person, place, and time. Mental status is at baseline.  ?   GCS: GCS eye subscore is 4. GCS verbal subscore is 5. GCS motor subscore is 6.  ?  Cranial Nerves: No cranial nerve deficit.  ?   Sensory: No sensory deficit.  ?   Motor: Tremor present.  ?Psychiatric:     ?   Attention and Perception: Attention normal.     ?   Speech: Speech normal.     ?   Behavior: Behavior normal.  ? ? ?ED Results / Procedures / Treatments   ?Labs ?(all labs ordered are listed, but only abnormal results are displayed) ?Labs Reviewed  ?CBC WITH DIFFERENTIAL/PLATELET  ?COMPREHENSIVE METABOLIC PANEL  ?URINALYSIS, ROUTINE W REFLEX MICROSCOPIC  ? ? ?EKG ?EKG Interpretation ? ?Date/Time:  Sunday February 08 2022 19:45:26 EDT ?Ventricular Rate:  118 ?PR Interval:  158 ?QRS Duration: 90 ?QT Interval:  325 ?QTC Calculation: 456 ?R Axis:   -25 ?Text Interpretation: Incomplete analysis due to missing data in precordial lead(s) Sinus tachycardia Borderline left axis deviation Abnormal R-wave progression, early transition Missing lead(s): V3 Confirmed by Lacretia Leigh ([id number]) on 02/08/2022 8:17:15 PM ? ?Radiology ?No results found. ? ?Procedures ?Procedures  ? ? ?Medications Ordered in ED ?Medications  ?lactated ringers infusion (has no administration in time range)  ?labetalol (NORMODYNE) injection 10 mg (has no administration in time range)  ? ? ?ED Course/ Medical Decision Making/ A&P ?  ?                        ?Medical Decision Making ?Amount and/or Complexity of Data Reviewed ?Labs: ordered. ? ?Risk ?Prescription drug  management. ? ? ?Patient presented with hypotension and tachycardia.  Low-grade temp noted.  Patient was treated for hypertension with labetalol x2.  Patient's lactate came back elevated at 2.6.  Blood cultures ordered.

## 2022-02-08 NOTE — H&P (Signed)
?History and Physical  ? ? ?Patient: Robert [name] FHQ:[id number] DOB: [birth year] ?DOA: 02/08/2022 ?DOS: the patient was seen and examined on 02/08/2022 ?PCP: [facility]  ?Patient coming from: Home ? ?Chief Complaint:  ?Chief Complaint  ?Patient presents with  ? Hypertension  ? ?HPI: Robert [name] is a 76 y.o. male with medical history significant of previous CVA, diabetes, GERD, essential hypertension, hyperlipidemia, anemia of chronic disease among other things who was brought in from home with initial complaint of uncontrolled hypertension.  Patient reported that his blood pressure was more than 498 systolic at home.  She is also feeling weak.  Patient was seen and evaluated.  Appears to have early sepsis symptoms with evidence of UTI.  Patient has indwelling catheter with recurrent UTIs.  His weakness is more than likely came from the infection.  He is being admitted to the hospital for further evaluation and treatment.  He has some nausea but denied any vomiting.  Denied any other complaints. ? ?Review of Systems: As mentioned in the history of present illness. All other systems reviewed and are negative. ?Past Medical History:  ?Diagnosis Date  ? Anemia   ? BPH (benign prostatic hyperplasia)   ? 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 Diabetes mellitus without complication ([place])   ? Diverticulosis   ? Frequency of urination   ? GERD (gastroesophageal reflux disease)   ? Gross hematuria   ? History of acute pyelonephritis   ? 10-13-2012  ? History of CVA with residual deficit   ? 2008--  left side of body weakness and foot drop (wears leg brace and uses cane)  ? History of DVT of lower extremity   ? 2008--  cva  ? Hypercholesteremia   ? Hyperlipidemia   ? Hyperlipidemia   ? Hypertension   ? Left foot drop   ? secondary to cva 2008  ? Left leg DVT ([place])   ? Neuromuscular disorder ([place])   ? Parkinsons  ? S/P insertion of IVC (inferior vena caval) filter   ? 2008  ?  Urethral stricture   ? Urgency of urination   ? Urinary retention   ? Weakness of left side of body   ? secondary to cva 2008  ? Wears glasses   ? Wears hearing aid   ? bilateral-- wears intermittantly  ? ?Past Surgical History:  ?Procedure Laterality Date  ? CYSTOSCOPY WITH RETROGRADE URETHROGRAM N/A 10/23/2015  ? Procedure: CYSTOSCOPY WITH RETROGRADE URETHROGRAM;  Surgeon: [name], MD;  Location[facility];  Service: Urology;  Laterality: N/A;  ? CYSTOSCOPY WITH URETHRAL DILATATION N/A 10/23/2015  ? Procedure: CYSTOSCOPY WITH URETHRAL BALLOON DILATATION;  Surgeon: [name], MD;  Location: [facility];  Service: Urology;  Laterality: N/A;  BALLOON DILATION ?  ? IVC FILTER PLACEMENT ([place] HX)  2008  ? LAPAROSCOPIC CHOLECYSTECTOMY SINGLE PORT N/A 01/09/2021  ? Procedure: LAPAROSCOPIC CHOLECYSTECTOMY WITH IOC;  Surgeon: [name], MD;  [facility];  Service: General;  Laterality: N/A;  90 MIN  ? TRANSCAROTID ARTERY REVASCULARIZATION?  Left 05/12/2021  ? Procedure: LEFT TRANSCAROTID ARTERY REVASCULARIZATION;  Surgeon: [name], MD;  Location: [facility];  Service: Vascular;  Laterality: Left;  ? ?Social History:  reports that he quit smoking about 51 years ago. His smoking use included cigarettes. He has never used smokeless tobacco. He reports that he does not drink alcohol and does not use drugs. ? ?Allergies  ?Allergen Reactions  ?  Propofol Other (See Comments)  ?  "hiccups for weeks" ? ?Other reaction(s): Hiccups  ? Lisinopril Cough  ? Sulfamethoxazole-Trimethoprim Diarrhea  ? ? ?Family History  ?Problem Relation Age of Onset  ? Diabetes Sister   ? Lung cancer Brother   ?     Post 9/11 voluntary work in [employer]  ? Prostate cancer Brother   ? Other Mother   ?     passed away young- non medical  ? Alzheimer's disease Father   ? Healthy Son   ? ? ?Prior to Admission medications   ?Medication Sig Start Date End Date Taking? Authorizing Provider  ?amoxicillin (AMOXIL)  500 MG capsule TAKE ONE CAPSULE BY MOUTH ONCE 01/07/22   [provider]  ?apixaban (ELIQUIS) 5 MG TABS tablet Take 1 tablet (5 mg total) by mouth 2 (two) times daily. 05/27/21   Bary Leriche, PA-C  ?aspirin EC 81 MG tablet Take 81 mg by mouth daily. Swallow whole.    [provider]  ?benzonatate (TESSALON) 100 MG capsule TAKE ONE CAPSULE BY MOUTH TWICE A DAY AS NEEDED FOR COUGH 10/01/21   [provider]  ?Blood Glucose Monitoring Suppl ([place]) w/Device KIT check blood sugar 11/25/20   [provider]  ?busPIRone (BUSPAR) 5 MG tablet TAKE TWO TABLETS BY MOUTH IN THE MORNING AND TAKE ONE AND ONE-HALF TABLETS AT NOON AND TAKE ONE AND ONE-HALF TABLETS AT BEDTIME FOR SEVERE ANXIETY FOR ANXIETY ?Patient not taking: Reported on 01/22/2022 11/20/21   [provider]  ?busPIRone (BUSPAR) 7.5 MG tablet Take 11.25 mg by mouth 2 (two) times daily. ?Patient not taking: Reported on 01/22/2022    [provider]  ?carbidopa-levodopa (SINEMET IR) 25-100 MG tablet TAKE 2 1/2 TABLETS BY MOUTH TWICE A DAY AND TAKE 1 TABLET ONCE A DAY WITH SUPPER 08/14/21   [provider]  ?Carbidopa-Levodopa ER (SINEMET CR) 25-100 MG tablet controlled release TAKE 2 1/2 TABLETS BY MOUTH TWICE A DAY AND TAKE 1 TABLET ONCE A DAY WITH SUPPER - NOT A DOSE CHANGE INCREASED FROM 2-2-1 TABLET, STILL BEST TAKEN AT 9, 1, 5. 12/18/21   [provider]  ?Cholecalciferol 50 MCG (2000 UT) TABS TAKE ONE TABLET BY MOUTH DAILY FOR LOW VITAMIN D3 12/23/21   [provider]  ?clotrimazole-betamethasone (LOTRISONE) cream 1 application 24/4/97   [provider]  ?emollient cream APPLY MODERATE AMOUNT TO AFFECTED AREA DAILY FOR THE BODY 11/06/21   [provider]  ?erythromycin ophthalmic ointment APPLY THIN RIBBON TO LEFT EYE DAILY FOR CONJUCTIVITIS 01/09/22   [provider]  ?finasteride (PROSCAR) 5 MG tablet TAKE ONE TABLET BY MOUTH DAILY FOR  PROSTATE 01/07/22   [provider]  ?Fluocinolone Acetonide Body 0.01 % OIL APPLY THIN FILM TO AFFECTED AREA AS DIRECTED APPLY TO THE SCALP 1-2 TIMES DAILY AS NEEDED FOR ITCHING 11/06/21   [provider]  ?hydrOXYzine (ATARAX) 10 MG tablet See admin instructions.    [provider]  ?lidocaine (LIDODERM) 5 % 1 patch remove after 12 hours ?Patient not taking: Reported on 01/22/2022    [provider]  ?memantine (NAMENDA) 10 MG tablet Take 10 mg by mouth 2 (two) times daily.    [provider]  ?metFORMIN (GLUCOPHAGE) 500 MG tablet Take 500 mg by mouth at bedtime.    [provider]  ?mirabegron ER (MYRBETRIQ) 25 MG TB24 tablet 1 tablet    [provider]  ?nystatin (MYCOSTATIN/NYSTOP) powder Apply 1 application topically 2 (  two) times daily.    [provider]  ?nystatin-triamcinolone ointment (MYCOLOG) APPLY THIN FILM TO AFFECTED AREA TWICE A DAY 10/26/21   [provider]  ?omeprazole (PRILOSEC) 20 MG capsule Take 20 mg by mouth daily.    [provider]  ?ondansetron (ZOFRAN) 8 MG tablet 1/2 to 1 tablet 09/23/21   [provider]  ?Pimavanserin Tartrate (NUPLAZID) 34 MG CAPS 1 capsule    [provider]  ?polyethylene glycol (MIRALAX / GLYCOLAX) 17 g packet 1 packet mixed with 8 ounces of fluid 05/12/17   [provider]  ?rasagiline (AZILECT) 1 MG TABS tablet Take 1 mg by mouth daily.    [provider]  ?sertraline (ZOLOFT) 50 MG tablet TAKE ONE-HALF TABLET BY MOUTH DAILY FOR MENTAL HEALTH 12/30/21   [provider]  ?simvastatin (ZOCOR) 20 MG tablet 1 tablet every evening 09/05/15   [provider]  ?simvastatin (ZOCOR) 40 MG tablet Take 40 mg by mouth daily.    [provider]  ?tacrolimus (PROTOPIC) 0.1 % ointment APPLY AS DIRECTED TO AFFECTED AREA TWICE A DAY AS NEEDED TO THE GROIN, ARMPIT, AND BOTTOM WHEN SKIN IS BROKEN OUT 11/06/21   [provider]   ? ? ?Physical Exam: ?Vitals:  ? 02/08/22 2130 02/08/22 2200 02/08/22 2230 02/08/22 2300  ?BP: (!) 154/86 (!) 153/85 (!) 154/108 120/76  ?Pulse: (!) 105 (!) 108 (!) 106 (!) 103  ?Resp: (!) 25 (!) 23 (!) 22 Marland Kitchen)

## 2022-02-09 ENCOUNTER — Encounter (HOSPITAL_COMMUNITY): Payer: Self-pay | Admitting: Internal Medicine

## 2022-02-09 DIAGNOSIS — Z978 Presence of other specified devices: Secondary | ICD-10-CM

## 2022-02-09 DIAGNOSIS — I69354 Hemiplegia and hemiparesis following cerebral infarction affecting left non-dominant side: Secondary | ICD-10-CM

## 2022-02-09 DIAGNOSIS — G20 Parkinson's disease: Secondary | ICD-10-CM

## 2022-02-09 DIAGNOSIS — A419 Sepsis, unspecified organism: Secondary | ICD-10-CM | POA: Diagnosis not present

## 2022-02-09 DIAGNOSIS — N10 Acute tubulo-interstitial nephritis: Secondary | ICD-10-CM

## 2022-02-09 DIAGNOSIS — I10 Essential (primary) hypertension: Secondary | ICD-10-CM

## 2022-02-09 LAB — CBC
HCT: 44.2 % (ref 39.0–52.0)
Hemoglobin: 14.3 g/dL (ref 13.0–17.0)
MCH: 29.4 pg (ref 26.0–34.0)
MCHC: 32.4 g/dL (ref 30.0–36.0)
MCV: 90.9 fL (ref 80.0–100.0)
Platelets: 262 10*3/uL (ref 150–400)
RBC: 4.86 MIL/uL (ref 4.22–5.81)
RDW: 13.6 % (ref 11.5–15.5)
WBC: 8.9 10*3/uL (ref 4.0–10.5)
nRBC: 0 % (ref 0.0–0.2)

## 2022-02-09 LAB — COMPREHENSIVE METABOLIC PANEL WITH GFR
ALT: 10 U/L (ref 0–44)
AST: 30 U/L (ref 15–41)
Albumin: 2.7 g/dL — ABNORMAL LOW (ref 3.5–5.0)
Alkaline Phosphatase: 59 U/L (ref 38–126)
Anion gap: 7 (ref 5–15)
BUN: 13 mg/dL (ref 8–23)
CO2: 27 mmol/L (ref 22–32)
Calcium: 9.6 mg/dL (ref 8.9–10.3)
Chloride: 101 mmol/L (ref 98–111)
Creatinine, Ser: 0.43 mg/dL — ABNORMAL LOW (ref 0.61–1.24)
GFR, Estimated: >60 mL/min
Glucose, Bld: 83 mg/dL (ref 70–99)
Potassium: 5 mmol/L (ref 3.5–5.1)
Sodium: 135 mmol/L (ref 135–145)
Total Bilirubin: 1.3 mg/dL — ABNORMAL HIGH (ref 0.3–1.2)
Total Protein: 5.9 g/dL — ABNORMAL LOW (ref 6.5–8.1)

## 2022-02-09 LAB — GLUCOSE, CAPILLARY
Glucose-Capillary: 118 mg/dL — ABNORMAL HIGH (ref 70–99)
Glucose-Capillary: 145 mg/dL — ABNORMAL HIGH (ref 70–99)
Glucose-Capillary: 165 mg/dL — ABNORMAL HIGH (ref 70–99)
Glucose-Capillary: 122 mg/dL — ABNORMAL HIGH (ref 70–99)

## 2022-02-09 LAB — HEMOGLOBIN A1C
Hgb A1c MFr Bld: 6.1 % — ABNORMAL HIGH (ref 4.8–5.6)
Mean Plasma Glucose: 128.37 mg/dL

## 2022-02-09 LAB — CBG MONITORING, ED: Glucose-Capillary: 166 mg/dL — ABNORMAL HIGH (ref 70–99)

## 2022-02-09 LAB — MRSA NEXT GEN BY PCR, NASAL: MRSA by PCR Next Gen: DETECTED — AB

## 2022-02-09 MED ORDER — ORAL CARE MOUTH RINSE
15.0000 mL | Freq: Two times a day (BID) | OROMUCOSAL | Status: DC
  Administered 2022-02-09 – 2022-02-12 (×7): 15 mL via OROMUCOSAL

## 2022-02-09 MED ORDER — METOPROLOL TARTRATE 5 MG/5ML IV SOLN: 5.0000 mg | Freq: Four times a day (QID) | INTRAVENOUS | Status: DC | PRN

## 2022-02-09 MED ORDER — SERTRALINE HCL 25 MG PO TABS: 25.0000 mg | ORAL_TABLET | Freq: Every day | ORAL | Status: DC

## 2022-02-09 MED ORDER — INSULIN ASPART 100 UNIT/ML IJ SOLN
0.0000 [IU] | Freq: Three times a day (TID) | INTRAMUSCULAR | Status: DC
  Administered 2022-02-09: 2 [IU] via SUBCUTANEOUS
  Administered 2022-02-09: 3 [IU] via SUBCUTANEOUS
  Administered 2022-02-10 – 2022-02-11 (×3): 2 [IU] via SUBCUTANEOUS
  Administered 2022-02-11: 3 [IU] via SUBCUTANEOUS
  Administered 2022-02-11: 2 [IU] via SUBCUTANEOUS
  Administered 2022-02-12: 3 [IU] via SUBCUTANEOUS
  Filled 2022-02-09: qty 0.15

## 2022-02-09 MED ORDER — CARBIDOPA-LEVODOPA ER 25-100 MG PO TBCR
1.0000 | EXTENDED_RELEASE_TABLET | Freq: Every day | ORAL | Status: DC
  Administered 2022-02-09 – 2022-02-11 (×3): 1 via ORAL
  Filled 2022-02-09 (×3): qty 1

## 2022-02-09 MED ORDER — INSULIN ASPART 100 UNIT/ML IJ SOLN
0.0000 [IU] | Freq: Every day | INTRAMUSCULAR | Status: DC
  Filled 2022-02-09: qty 0.05

## 2022-02-09 MED ORDER — CARBIDOPA-LEVODOPA ER 25-100 MG PO TBCR
2.5000 | EXTENDED_RELEASE_TABLET | Freq: Two times a day (BID) | ORAL | Status: DC
  Administered 2022-02-09 – 2022-02-12 (×7): 2.5 via ORAL
  Filled 2022-02-09 (×8): qty 2.5

## 2022-02-09 MED ORDER — ONDANSETRON HCL 4 MG PO TABS: 4.0000 mg | ORAL_TABLET | Freq: Four times a day (QID) | ORAL | Status: DC | PRN

## 2022-02-09 MED ORDER — ENSURE ENLIVE PO LIQD
237.0000 mL | Freq: Two times a day (BID) | ORAL | Status: DC
  Administered 2022-02-09 – 2022-02-12 (×7): 237 mL via ORAL

## 2022-02-09 MED ORDER — RASAGILINE MESYLATE 1 MG PO TABS
1.0000 mg | ORAL_TABLET | Freq: Every day | ORAL | Status: DC
  Administered 2022-02-10 – 2022-02-12 (×3): 1 mg via ORAL
  Filled 2022-02-09 (×4): qty 1

## 2022-02-09 MED ORDER — PANTOPRAZOLE SODIUM 40 MG PO TBEC
40.0000 mg | DELAYED_RELEASE_TABLET | Freq: Every day | ORAL | Status: DC
  Administered 2022-02-09 – 2022-02-12 (×4): 40 mg via ORAL
  Filled 2022-02-09 (×4): qty 1

## 2022-02-09 MED ORDER — BENZONATATE 100 MG PO CAPS: 100.0000 mg | ORAL_CAPSULE | Freq: Three times a day (TID) | ORAL | Status: DC | PRN

## 2022-02-09 MED ORDER — APIXABAN 5 MG PO TABS
5.0000 mg | ORAL_TABLET | Freq: Two times a day (BID) | ORAL | Status: DC
  Administered 2022-02-09 – 2022-02-12 (×8): 5 mg via ORAL
  Filled 2022-02-09 (×8): qty 1

## 2022-02-09 MED ORDER — ACETAMINOPHEN 325 MG PO TABS
650.0000 mg | ORAL_TABLET | Freq: Four times a day (QID) | ORAL | Status: DC | PRN
  Administered 2022-02-09: 650 mg via ORAL
  Filled 2022-02-09: qty 2

## 2022-02-09 MED ORDER — SIMVASTATIN 40 MG PO TABS
40.0000 mg | ORAL_TABLET | Freq: Every day | ORAL | Status: DC
  Administered 2022-02-09 – 2022-02-11 (×3): 40 mg via ORAL
  Filled 2022-02-09 (×3): qty 1

## 2022-02-09 MED ORDER — ONDANSETRON HCL 4 MG/2ML IJ SOLN: 4.0000 mg | Freq: Four times a day (QID) | INTRAMUSCULAR | Status: DC | PRN

## 2022-02-09 MED ORDER — PIMAVANSERIN TARTRATE 34 MG PO CAPS
34.0000 mg | ORAL_CAPSULE | Freq: Every day | ORAL | Status: DC
  Filled 2022-02-09: qty 1

## 2022-02-09 MED ORDER — CARBIDOPA-LEVODOPA ER 25-100 MG PO TBCR: 1.0000 | EXTENDED_RELEASE_TABLET | Freq: Three times a day (TID) | ORAL | Status: DC

## 2022-02-09 MED ORDER — FINASTERIDE 5 MG PO TABS
5.0000 mg | ORAL_TABLET | Freq: Every day | ORAL | Status: DC
  Administered 2022-02-09 – 2022-02-11 (×3): 5 mg via ORAL
  Filled 2022-02-09 (×3): qty 1

## 2022-02-09 MED ORDER — MEMANTINE HCL 10 MG PO TABS
20.0000 mg | ORAL_TABLET | Freq: Every day | ORAL | Status: DC
  Administered 2022-02-09 – 2022-02-11 (×3): 20 mg via ORAL
  Filled 2022-02-09 (×3): qty 2

## 2022-02-09 MED ORDER — POTASSIUM CHLORIDE 2 MEQ/ML IV SOLN
INTRAVENOUS | Status: DC
  Filled 2022-02-09 (×9): qty 1000

## 2022-02-09 MED ORDER — ACETAMINOPHEN 650 MG RE SUPP: 650.0000 mg | Freq: Four times a day (QID) | RECTAL | Status: DC | PRN

## 2022-02-09 MED ORDER — PIMAVANSERIN TARTRATE 34 MG PO CAPS
34.0000 mg | ORAL_CAPSULE | Freq: Every day | ORAL | Status: DC
  Administered 2022-02-09 – 2022-02-12 (×4): 34 mg via ORAL

## 2022-02-09 MED ORDER — MENTHOL 3 MG MT LOZG
1.0000 | LOZENGE | OROMUCOSAL | Status: DC | PRN
  Filled 2022-02-09: qty 9

## 2022-02-09 NOTE — Progress Notes (Signed)
?Progress Note ? ?Patient: Robert [name] OIZ:[id number] DOB: January 07, 1946  ?DOA: 02/08/2022  DOS: 02/09/2022  ?  ?Brief hospital course: ?Robert [name] is a 76 y.o. male with a history of CVA w/left hemiparesis, chronic foley catheter w/recurrent UTI, Parkinson's disease, HTN, HLD, T2DM who presented to the [facility] 4/16 from home with severe hypertension SBP >254mHg with associated diffuse weakness. Urinalysis appeared to reveal UTI with significant pyuria. WBC 10.5k, lactic acid 2.6, Blood cultures were drawn and ceftriaxone started on admission for HTN urgency and UTI. ? ?Assessment and Plan: ?Weakness: Suspected to be due to UTI. TSH 0.940, no new focal deficits to suggest ischemic event.  ?- Tx UTI ?- PT/OT ? ?CAUTI: POA. ?- Continue ceftriaxone empirically ?- Await urine culture data, monitor blood cultures ?- Resume methenamine suppression after completion of acute antibiotic ? ?BPH w/urethral stricture and urinary retention: Followed by Dr. GRisa Grill?- Continue finasteride, foley ? ?HTN urgency: Improving. Unclear precipitant as no prn's have been required.  ?- Monitor closely, continue prn Tx's. No home meds noted. ? ?HLD:  ?- Continue statin ? 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 ?History of CVA:  ?- Continue eliquis, statin.  ? ?T2DM:  ?- SSI ? ?Parkinson's disease with dementia:  ?- Continue sinemet, rasagiline, pimavanserin, namenda ? ?Depression:  ?- Due to risk for serotonin syndrome (possible cause of HTN urgency), will stop sertraline now and discontinue it until follow up with neurology.  ? ?GERD:  ?- Continue PPI ? ?Subjective: Pt feels less weak than previously.  ? ?Objective: ?Vitals:  ? 02/09/22 0300 02/09/22 0330 02/09/22 0412 02/09/22 0900  ?BP: 118/68 114/65 139/78 133/74  ?Pulse: 91 83 88 84  ?Resp: '18 18 16 16  '$ ?Temp:  98.6 ?F (37 ?C) 98 ?F (36.7 ?C) 98.4 ?F (36.9 ?C)  ?TempSrc:  Oral Oral Oral  ?SpO2: 97% 97% 98%   ?Weight:   98.4 kg   ?Height:      ? ?Gen:  Chronically ill-appearing 76y.o. male in no distress ?Pulm: Nonlabored breathing room air. Clear ?CV: Regular rate and rhythm. No murmur, rub, or gallop. No JVD, trace dependent edema. ?GI: Abdomen soft, mild suprapubic tenderness, elsewhere nontender, non-distended, with normoactive bowel sounds.  ?Ext: Warm, dry, left hemiparesis without deformities ?Skin: No rashes, lesions or ulcers on visualized skin. ?Neuro: Alert and oriented, HOH. No acute focal neurological deficits. ?Psych: Judgement and insight appear marginal. Mood euthymic & affect congruent. Behavior is appropriate.   ? ?Data Personally reviewed: ? ?CBC: ?Recent Labs  ?Lab 02/08/22 ?2000 02/09/22 ?03382 ?WBC 10.5 8.9  ?NEUTROABS 8.3*  --   ?HGB 15.2 14.3  ?HCT 47.1 44.2  ?MCV 91.5 90.9  ?PLT 290 262  ? ?Basic Metabolic Panel: ?Recent Labs  ?Lab 02/08/22 ?2000 02/09/22 ?0552  ?NA 137 135  ?K 3.4* 5.0  ?CL 102 101  ?CO2 27 27  ?GLUCOSE 148* 83  ?BUN 18 13  ?CREATININE 1.11 0.43*  ?CALCIUM 9.0 9.6  ? ?GFR: ?Estimated Creatinine Clearance: 94.2 mL/min (A) (by C-G formula based on SCr of 0.43 mg/dL (L)). ?Liver Function Tests: ?Recent Labs  ?Lab 02/08/22 ?2000 02/09/22 ?0552  ?AST 7* 30  ?ALT 9 10  ?ALKPHOS 82 59  ?BILITOT 0.9 1.3*  ?PROT 7.2 5.9*  ?ALBUMIN 3.7 2.7*  ? ?No results for input(s): LIPASE, AMYLASE in the last 168 hours. ?No results for input(s): AMMONIA in the last 168 hours. ?Coagulation Profile: ?No results for input(s): INR, PROTIME in the  last 168 hours. ?Cardiac Enzymes: ?No results for input(s): CKTOTAL, CKMB, CKMBINDEX, TROPONINI in the last 168 hours. ?BNP (last 3 results) ?No results for input(s): PROBNP in the last 8760 hours. ?HbA1C: ?Recent Labs  ?  02/08/22 ?2000  ?HGBA1C 6.1*  ? ?CBG: ?Recent Labs  ?Lab 02/09/22 ?0054 02/09/22 ?0745 02/09/22 ?1155  ?GLUCAP 166* 118* 145*  ? ?Lipid Profile: ?No results for input(s): CHOL, HDL, LDLCALC, TRIG, CHOLHDL, LDLDIRECT in the last 72 hours. ?Thyroid Function Tests: ?Recent Labs  ?   02/08/22 ?2000  ?TSH 0.940  ? ?Anemia Panel: ?No results for input(s): VITAMINB12, FOLATE, FERRITIN, TIBC, IRON, RETICCTPCT in the last 72 hours. ?Urine analysis: ?   ?Component Value Date/Time  ? [place] YELLOW 02/08/2022 2023  ? APPEARANCEUR TURBID (A) 02/08/2022 2023  ? LABSPEC 1.016 02/08/2022 2023  ? PHURINE 8.0 02/08/2022 2023  ? GLUCOSEU NEGATIVE 02/08/2022 2023  ? GLUCOSEU 100 (A) 02/06/2019 0813  ? HGBUR SMALL (A) 02/08/2022 2023  ? [place] NEGATIVE 02/08/2022 2023  ? KETONESUR 5 (A) 02/08/2022 2023  ? PROTEINUR 100 (A) 02/08/2022 2023  ? UROBILINOGEN 1.0 02/06/2019 0813  ? NITRITE NEGATIVE 02/08/2022 2023  ? LEUKOCYTESUR LARGE (A) 02/08/2022 2023  ? ?Recent Results (from the past 240 hour(s))  ?MRSA Next Gen by PCR, Nasal     Status: Abnormal  ? Collection Time: 02/09/22  4:06 AM  ? Specimen: Nasal Mucosa; Nasal Swab  ?Result Value Ref Range Status  ? MRSA by PCR Next Gen DETECTED (A) NOT DETECTED Final  ?  Comment: RESULT CALLED TO, READ BACK BY AND VERIFIED WITH: ?PAM M. RN AT 1032 02/09/2022 BY MECIAL J. ?(NOTE) ?The GeneXpert MRSA Assay (FDA approved for NASAL specimens only), ?is one component of a comprehensive MRSA colonization surveillance ?program. It is not intended to diagnose MRSA infection nor to guide ?or monitor treatment for MRSA infections. ?Test performance is not FDA approved in patients less than 2 years ?old. ?Performed at [facility], [address] ?[place], [place] [address] ?  ?   ?DG Chest Port 1 View ? ?Result Date: 02/08/2022 ?CLINICAL DATA:  Fever. EXAM: PORTABLE CHEST 1 VIEW COMPARISON:  January 04, 2021 FINDINGS: The heart size and mediastinal contours are within normal limits. Mild, chronic appearing increased lung markings are seen. There is no evidence of acute infiltrate, pleural effusion or pneumothorax. Multilevel degenerative changes seen throughout the thoracic spine. IMPRESSION: No active cardiopulmonary disease. Electronically Signed   By:  Virgina Norfolk M.D.   On: 02/08/2022 23:54   ? ?Family Communication: Spouse at bedside ? ?Disposition: ?Status is: Inpatient ?Remains inpatient appropriate because: Monitoring cultures, continue IV abx for weakness due to UTI. ?Planned Discharge Destination: Home with Home Health ? ? ? ? ? ?Patrecia Pour, MD ?02/09/2022 3:06 PM ?Page by [email or web address]  ?

## 2022-02-09 NOTE — ED Notes (Signed)
ED TO INPATIENT HANDOFF REPORT ? ?ED Nurse Name and Phone #: [phone] [name], RN  ? ?S ?Name/Age/Gender ?Robert [name] ?76 y.o. ?male ?Room/Bed: WA13/WA13 ? ?Code Status ?  Code Status: Full Code ? ?Home/SNF/Other ?Home ?Patient oriented to: self, place, and situation ?Is this baseline? Yes  ? ?Triage Complete: Triage complete  ?Chief Complaint ?Sepsis secondary to UTI ([place]) [A41.9, N39.0] ? ?Triage Note ?Patient BIB [facility] , patient has history of parkinsons, lives with wife, wife says patient has been "shaking" more than baseline, wife checked patients bp and it was high, patient has no history of HTN. Patient denies pain in triage   ? ?Allergies ?Allergies  ?Allergen Reactions  ? Propofol Other (See Comments)  ?  "hiccups for weeks" ? ?Other reaction(s): Hiccups  ? Lisinopril Cough  ? Sulfamethoxazole-Trimethoprim Diarrhea  ? ? ?Level of Care/Admitting Diagnosis ?ED Disposition   ? ? ED Disposition  ?Admit  ? Condition  ?--  ? Comment  ?Hospital Area[facility] [[id number]] ? Level of Care: Telemetry [5] ? Admit to tele based on following criteria: Complex arrhythmia (Bradycardia/Tachycardia) ? May admit patient to [name] or [name] if equivalent level of care is available:: Yes ? Covid Evaluation: Asymptomatic - no recent exposure (last 10 days) testing not required ? Diagnosis: Sepsis secondary to UTI [facility]) [[id number]] ? Admitting Physician: Elwyn Reach [[id number]] ? Attending Physician: Elwyn Reach [[id number]] ? Estimated length of stay: past midnight tomorrow ? Certification:: I certify this patient will need inpatient services for at least 2 midnights ?  ?  ? ?  ? ? ?B ?Medical/Surgery History ?Past Medical History:  ?Diagnosis Date  ? Anemia   ? BPH (benign prostatic hyperplasia)   ? CVA (cerebral vascular accident) [facility])   ? with left sided hemiparesis  ? Diabetes mellitus without complication ([place])   ? Diverticulosis   ? Frequency of urination   ? GERD (gastroesophageal reflux  disease)   ? Gross hematuria   ? History of acute pyelonephritis   ? 10-13-2012  ? History of CVA with residual deficit   ? 2008--  left side of body weakness and foot drop (wears leg brace and uses cane)  ? History of DVT of lower extremity   ? 2008--  cva  ? Hypercholesteremia   ? Hyperlipidemia   ? Hyperlipidemia   ? Hypertension   ? Left foot drop   ? secondary to cva 2008  ? Left leg DVT ([place])   ? Neuromuscular disorder ([place])   ? Parkinsons  ? S/P insertion of IVC (inferior vena caval) filter   ? 2008  ? Urethral stricture   ? Urgency of urination   ? Urinary retention   ? Weakness of left side of body   ? secondary to cva 2008  ? Wears glasses   ? Wears hearing aid   ? bilateral-- wears intermittantly  ? ?Past Surgical History:  ?Procedure Laterality Date  ? CYSTOSCOPY WITH RETROGRADE URETHROGRAM N/A 10/23/2015  ? Procedure: CYSTOSCOPY WITH RETROGRADE URETHROGRAM;  Surgeon: [name], MD;  [facility];  Service: Urology;  Laterality: N/A;  ? CYSTOSCOPY WITH URETHRAL DILATATION N/A 10/23/2015  ? Procedure: CYSTOSCOPY WITH URETHRAL BALLOON DILATATION;  Surgeon: [name], MD;  Location: [facility];  Service: Urology;  Laterality: N/A;  BALLOON DILATION ?  ? IVC FILTER PLACEMENT ([place] [facility])  2008  ? LAPAROSCOPIC CHOLECYSTECTOMY SINGLE PORT N/A 01/09/2021  ? Procedure: LAPAROSCOPIC CHOLECYSTECTOMY WITH IOC;  Surgeon: [name], MD;  Location: [facility];  Service: General;  Laterality: N/A;  90 MIN  ? TRANSCAROTID ARTERY REVASCULARIZATION?  Left 05/12/2021  ? Procedure: LEFT TRANSCAROTID ARTERY REVASCULARIZATION;  Surgeon: [name], MD;  Location: [place];  Service: Vascular;  Laterality: Left;  ?  ? ?A ?IV Location/Drains/Wounds ?Patient Lines/Drains/Airways Status   ? ? Active Line/Drains/Airways   ? ? Name Placement date Placement time Site Days  ? Peripheral IV 02/08/22 20 G Right Antecubital 02/08/22  1947  Antecubital  1  ? Urethral Catheter Ethelene Browns, RN  Coude 20 Fr. 02/08/22  2248  Coude  1  ? Incision (Closed) 05/12/21 Groin Right 05/12/21  1121  -- 273  ? Incision (Closed) 05/12/21 Neck Left 05/12/21  1121  -- 273  ? Incision - 4 Ports Abdomen Umbilicus Upper;Mid Right;Lateral Right;Medial 01/09/21  1110  -- 396  ? Wound / Incision (Open or Dehisced) 05/21/21 Other (Comment) Buttocks Left Excoriation 05/21/21  1037  Buttocks  264  ? Wound / Incision (Open or Dehisced) 05/21/21 Other (Comment) Buttocks Right excoriation 05/21/21  1036  Buttocks  264  ? ?  ?  ? ?  ? ? ?Intake/Output Last 24 hours ? ?Intake/Output Summary (Last 24 hours) at 02/09/2022 0253 ?Last data filed at 02/09/2022 0110 ?Gross per 24 hour  ?Intake 100 ml  ?Output --  ?Net 100 ml  ? ? ?Labs/Imaging ?Results for orders placed or performed during the hospital encounter of 02/08/22 (from the past 48 hour(s))  ?CBC with Differential/Platelet     Status: Abnormal  ? Collection Time: 02/08/22  8:00 PM  ?Result Value Ref Range  ? WBC 10.5 4.0 - 10.5 K/uL  ? RBC 5.15 4.22 - 5.81 MIL/uL  ? Hemoglobin 15.2 13.0 - 17.0 g/dL  ? HCT 47.1 39.0 - 52.0 %  ? MCV 91.5 80.0 - 100.0 fL  ? MCH 29.5 26.0 - 34.0 pg  ? MCHC 32.3 30.0 - 36.0 g/dL  ? RDW 13.4 11.5 - 15.5 %  ? Platelets 290 150 - 400 K/uL  ? nRBC 0.0 0.0 - 0.2 %  ? Neutrophils Relative % 79 %  ? Neutro Abs 8.3 (H) 1.7 - 7.7 K/uL  ? Lymphocytes Relative 9 %  ? Lymphs Abs 0.9 0.7 - 4.0 K/uL  ? Monocytes Relative 11 %  ? Monocytes Absolute 1.2 (H) 0.1 - 1.0 K/uL  ? Eosinophils Relative 1 %  ? Eosinophils Absolute 0.1 0.0 - 0.5 K/uL  ? Basophils Relative 0 %  ? Basophils Absolute 0.0 0.0 - 0.1 K/uL  ? Immature Granulocytes 0 %  ? Abs Immature Granulocytes 0.04 0.00 - 0.07 K/uL  ?  Comment: Performed at [facility], [place] [address], [place], [place] [address]  ?Comprehensive metabolic panel     Status: Abnormal  ? Collection Time: 02/08/22  8:00 PM  ?Result Value Ref Range  ? Sodium 137 135 - 145 mmol/L  ? Potassium 3.4 (L) 3.5 - 5.1 mmol/L  ?  Chloride 102 98 - 111 mmol/L  ? CO2 27 22 - 32 mmol/L  ? Glucose, Bld 148 (H) 70 - 99 mg/dL  ?  Comment: Glucose reference range applies only to samples taken after fasting for at least 8 hours.  ? BUN 18 8 - 23 mg/dL  ? Creatinine, Ser 1.11 0.61 - 1.24 mg/dL  ? Calcium 9.0 8.9 - 10.3 mg/dL  ? Total Protein 7.2 6.5 - 8.1 g/dL  ? Albumin 3.7 3.5 -  5.0 g/dL  ? AST 7 (L) 15 - 41 U/L  ? ALT 9 0 - 44 U/L  ? Alkaline Phosphatase 82 38 - 126 U/L  ? Total Bilirubin 0.9 0.3 - 1.2 mg/dL  ? GFR, Estimated >60 >60 mL/min  ?  Comment: (NOTE) ?Calculated using the CKD-EPI Creatinine Equation (2021) ?  ? Anion gap 8 5 - 15  ?  Comment: Performed at [facility], [place] [address], [place], [place] [address]  ?TSH     Status: None  ? Collection Time: 02/08/22  8:00 PM  ?Result Value Ref Range  ? TSH 0.940 0.350 - 4.500 uIU/mL  ?  Comment: Performed by a 3rd Generation assay with a functional sensitivity of <=0.01 uIU/mL. ?Performed at [facility], [place] [address], [place], [place] [address] ?  ?Urinalysis, Routine w reflex microscopic     Status: Abnormal  ? Collection Time: 02/08/22  8:23 PM  ?Result Value Ref Range  ? Color, Urine YELLOW YELLOW  ? APPearance TURBID (A) CLEAR  ? Specific Gravity, Urine 1.016 1.005 - 1.030  ? pH 8.0 5.0 - 8.0  ? Glucose, UA NEGATIVE NEGATIVE mg/dL  ? Hgb urine dipstick SMALL (A) NEGATIVE  ? Bilirubin Urine NEGATIVE NEGATIVE  ? Ketones, ur 5 (A) NEGATIVE mg/dL  ? Protein, ur 100 (A) NEGATIVE mg/dL  ? Nitrite NEGATIVE NEGATIVE  ? Leukocytes,Ua LARGE (A) NEGATIVE  ? RBC / HPF 11-20 0 - 5 RBC/hpf  ? WBC, UA >50 (H) 0 - 5 WBC/hpf  ? Bacteria, UA NONE SEEN NONE SEEN  ? WBC Clumps PRESENT   ? Triple Phosphate Crystal PRESENT   ?  Comment: Performed at [facility], [place] [address] [place], [place] [address]  ?Lactic acid, plasma     Status: Abnormal  ? Collection Time: 02/08/22  9:03 PM  ?Result Value Ref Range  ? Lactic Acid, Venous 2.6 (HH) 0.5 - 1.9  mmol/L  ?  Comment: CRITICAL RESULT CALLED TO, READ BACK BY AND VERIFIED WITH: ?TALKINGTON,J. 02/08/22 '@2158'$  BY SEELM,. ?Performed at [facility], [address] [place],

## 2022-02-09 NOTE — Progress Notes (Signed)
Robert [name] received to room 1615/1615-01 from ED with Sepsis r/t UTI. Pt has chronic foley which was changed out in the ED prior to arrival.  Urine is cloudy yellow and odorous.   ? ?BP 139/78 (BP Location: Right Arm)   Pulse 88   Temp 98 ?F (36.7 ?C) (Oral)   Resp 16   Ht '6\' 2"'$  (1.88 m)   Wt 98.4 kg   SpO2 98%   BMI 27.85 kg/m?  ? ?Patient oriented to room and unit.  Call bell and personal items in reach.  Admission history completed.  Plan of care initiated. ?Ayesha Mohair BSN RN CMSRN ?02/09/2022, 4:30 AM ? ? ?  ?

## 2022-02-09 NOTE — Progress Notes (Signed)
I have spent a significant amount of time with patient and wife this morning discussing rasagiline/sertraline PTA drug combination. Have also discussed with Loma Sousa, their pharmacist at the [place] [phone], and Dr. Bonner Puna. I expressed my opinion that sertraline should be stopped and re-discussed with their providers at the [place]. There are may other options for anxiety available. I provided the wife with a printout on the s/sx of serotonin syndrome from the [facility] ? ?Garison Genova S. Alford Highland, PharmD, BCPS ?Clinical Staff Pharmacist ?[email or web address] ?

## 2022-02-09 NOTE — TOC Initial Note (Signed)
Transition of Care (TOC) - Initial/Assessment Note  ? ? ?Patient Details  ?Name: Robert [name] ?MRN: [id number] ?Date of Birth: [birth year] ? ?Transition of Care (TOC) CM/SW Contact:    ?Tawanna Cooler, RN ?Phone Number: ?02/09/2022, 2:31 PM ? ?Clinical Narrative:                 ? ?Patient from home with wife. Has chronic foley catheter, left sided weakness from prior CVA, and Parkinson's disease.   ?TOC following for discharge needs.  ? ? ?Expected Discharge Plan: Home/Self Care ?Barriers to Discharge: Continued Medical Work up ? ? ?Expected Discharge Plan and Services ?Expected Discharge Plan: Home/Self Care ?  ?   ?Living arrangements for the past 2 months: Apartment ?                ?  ? ?Prior Living Arrangements/Services ?Living arrangements for the past 2 months: Apartment ?Lives with:: Spouse ?Patient language and need for interpreter reviewed:: Yes ?       ?Need for Family Participation in Patient Care: Yes (Comment) ?Care giver support system in place?: Yes (comment) ?Current home services: DME ?Criminal Activity/Legal Involvement Pertinent to Current Situation/Hospitalization: No - Comment as needed ? ?Activities of Daily Living ?Home Assistive Devices/Equipment: Wheelchair, Hospital bed (patient is limited historian and may have a few more of these things.) ?ADL Screening (condition at time of admission) ?Patient's cognitive ability adequate to safely complete daily activities?: No ?Is the patient deaf or have difficulty hearing?: Yes (HA in both ears but only right aid in presently.) ?Does the patient have difficulty seeing, even when wearing glasses/contacts?: Yes ?Does the patient have difficulty concentrating, remembering, or making decisions?: Yes ?Patient able to express need for assistance with ADLs?: Yes ?Does the patient have difficulty dressing or bathing?: Yes ?Independently performs ADLs?: No ?Communication: Independent ?Dressing (OT): Needs assistance ?Is this a change from baseline?:  Pre-admission baseline ?Grooming: Needs assistance ?Is this a change from baseline?: Pre-admission baseline ?Feeding: Independent ?Bathing: Needs assistance ?Is this a change from baseline?: Pre-admission baseline ?Toileting: Needs assistance ?Is this a change from baseline?: Pre-admission baseline ?In/Out Bed: Needs assistance ?Is this a change from baseline?: Pre-admission baseline ?Walks in Home: Needs assistance (Wheelchair at home.) ?Is this a change from baseline?: Pre-admission baseline ?Does the patient have difficulty walking or climbing stairs?: Yes ?Weakness of Legs: Both ?Weakness of Arms/Hands: Both ? ?Emotional Assessment ?  ?Orientation: : Oriented to Self, Oriented to Place, Oriented to  Time, Oriented to Situation ?Alcohol / Substance Use: Not Applicable ?Psych Involvement: No (comment) ? ?Admission diagnosis:  Sepsis secondary to UTI ([place]) [A41.9, N39.0] ?Patient Active Problem List  ? Diagnosis Date Noted  ? Embolic stroke ([place]) 62/22/9798  ? Spasticity as late effect of cerebrovascular accident (CVA) 05/15/2021  ? Asymptomatic carotid artery stenosis without infarction, left 05/12/2021  ? Carotid artery disease ([place]) 04/29/2021  ? Acute pancreatitis   ? Palliative care by specialist   ? Goals of care, counseling/discussion   ? 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 01/07/2021  ? Gallstone pancreatitis 01/07/2021  ? Pressure injury of skin 01/05/2021  ? Sepsis secondary to UTI ([place]) 11/10/2020  ? AKI (acute kidney injury) ([place]) 11/10/2020  ? Hyperkalemia 11/10/2020  ? Parkinson's disease ([place]) 11/10/2020  ? Hyperglycemia 11/10/2020  ? Elevated LFTs 11/10/2020  ? Diarrhea   ? Other hydronephrosis   ? History of deep vein thrombosis (DVT) of lower extremity   ? Urinary retention 08/14/2019  ? History of CVA with  residual deficit   ? BPH (benign prostatic hyperplasia)   ? Orthostatic hypotension 08/13/2019  ? Hyperlipidemia   ? Hemiparesis affecting left side as late effect of  cerebrovascular accident ([place]) 12/06/2017  ? Gait abnormality 11/22/2017  ? UTI (urinary tract infection) 04/22/2017  ? Acute lower UTI 04/22/2017  ? Tremor 04/22/2017  ? Acute pyelonephritis 09/29/2012  ? Nausea 09/29/2012  ? Fever 09/29/2012  ? HTN (hypertension) 09/29/2012  ? H/O: CVA (cerebrovascular accident) 09/29/2012  ? Hearing loss 09/29/2012  ? ?PCP:  [facility], [facility] ?Pharmacy:   ?[facility] # [place], [place] ?[place] ?[place] [address] ?Phone: [phone] Fax: [phone] ? ?[place] [address] ?[phone] ?[place] [address] ?Phone: [phone] Fax: [phone] ? ? ? ?

## 2022-02-10 DIAGNOSIS — T83511A Infection and inflammatory reaction due to indwelling urethral catheter, initial encounter: Secondary | ICD-10-CM

## 2022-02-10 DIAGNOSIS — E119 Type 2 diabetes mellitus without complications: Secondary | ICD-10-CM

## 2022-02-10 DIAGNOSIS — A419 Sepsis, unspecified organism: Secondary | ICD-10-CM | POA: Diagnosis not present

## 2022-02-10 DIAGNOSIS — N400 Enlarged prostate without lower urinary tract symptoms: Secondary | ICD-10-CM

## 2022-02-10 DIAGNOSIS — I69354 Hemiplegia and hemiparesis following cerebral infarction affecting left non-dominant side: Secondary | ICD-10-CM | POA: Diagnosis not present

## 2022-02-10 DIAGNOSIS — Z86718 Personal history of other venous thrombosis and embolism: Secondary | ICD-10-CM

## 2022-02-10 LAB — BASIC METABOLIC PANEL
Anion gap: 5 (ref 5–15)
BUN: 13 mg/dL (ref 8–23)
CO2: 30 mmol/L (ref 22–32)
Calcium: 9.1 mg/dL (ref 8.9–10.3)
Chloride: 104 mmol/L (ref 98–111)
Creatinine, Ser: 0.93 mg/dL (ref 0.61–1.24)
GFR, Estimated: >60 mL/min (ref >60)
Glucose, Bld: 123 mg/dL — ABNORMAL HIGH (ref 70–99)
Potassium: 4.1 mmol/L (ref 3.5–5.1)
Sodium: 139 mmol/L (ref 135–145)

## 2022-02-10 LAB — GLUCOSE, CAPILLARY
Glucose-Capillary: 144 mg/dL — ABNORMAL HIGH (ref 70–99)
Glucose-Capillary: 127 mg/dL — ABNORMAL HIGH (ref 70–99)
Glucose-Capillary: 122 mg/dL — ABNORMAL HIGH (ref 70–99)
Glucose-Capillary: 146 mg/dL — ABNORMAL HIGH (ref 70–99)

## 2022-02-10 MED ORDER — CHLORHEXIDINE GLUCONATE CLOTH 2 % EX PADS
6.0000 | MEDICATED_PAD | Freq: Every day | CUTANEOUS | Status: DC
  Administered 2022-02-10 – 2022-02-12 (×3): 6 via TOPICAL

## 2022-02-10 NOTE — Assessment & Plan Note (Signed)
Reportedly SBP greater than 200 at home.  Normotensive for most part.  No other medication at home. ?-Metoprolol as needed ?

## 2022-02-10 NOTE — Assessment & Plan Note (Addendum)
Patient with no fever, leukocytosis, encephalopathy or lower abdominal pain.  UA with pyuria.  He had lactic acidosis.  Blood cultures NGTD.  Foley catheter exchanged.  Urine culture was not sent. ?-Check urine culture although after antibiotics. ?-Continue IV ceftriaxone pending urine culture ?-Hold home Hip-Rex while on ceftriaxone. ?

## 2022-02-10 NOTE — Hospital Course (Addendum)
76 year old M with PMH of CVA and residual left hemiparesis, BPH/urethral stricture with chronic Foley, Parkinson disease, recurrent UTI, DM-2, HTN and depression brought to ED due to elevated blood pressure and generalized weakness.  SBP reportedly greater than 200 at home.  Vitals stable except for mild tachycardia and tachypnea on arrival.  CMP and CBC without significant finding other than mild hypokalemia.  Lactic acid elevated to 2.6.  UA with large LE and small Hgb.  Blood cultures obtained.  Foley catheter exchanged in ED.  Started on IV ceftriaxone for presumed pyelonephritis/CAUTI.  ? ?Patient remains on IV ceftriaxone.  ?

## 2022-02-10 NOTE — Assessment & Plan Note (Addendum)
Stable. ?-Continue Eliquis, aspirin and statin. ?-PT/OT eval ?

## 2022-02-10 NOTE — Assessment & Plan Note (Signed)
Controlled.  A1c 6.1%.  On metformin at home. ?Recent Labs  ?Lab 02/09/22 ?1155 02/09/22 ?1753 02/09/22 ?2135 02/10/22 ?0723 02/10/22 ?1117  ?GLUCAP 145* 165* 122* 144* 127*  ?-Continue SSI ?-Continue statin ? ?

## 2022-02-10 NOTE — Assessment & Plan Note (Signed)
-   Continue Eliquis 

## 2022-02-10 NOTE — Evaluation (Signed)
Physical Therapy Evaluation ?Patient Details ?Name: Robert [name] ?MRN: [id number] ?DOB: Dec 25, 1945 ?Today's Date: 02/10/2022 ? ?History of Present Illness ? Patient is a 76 year old male who presented to the hosptial on 02/08/22 with weakenss. Patient was found to have sepsis secondary to UTI . PMH: HTN,PD, HLD, Leg DVT, CVA L hemiparesis, dementia  ?Clinical Impression ? Pt admitted with above diagnosis. At baseline, pt is non-ambulatory but can stand and transfer with assist of 1 using a STEDY like device.  Today, pt required max x 2 to get to EOB and was unable to stand. Pt has an aide at home but only a few hours a day. Pt is below his baseline and may benefit from SNF if unable to progress.  Pt currently with functional limitations due to the deficits listed below (see PT Problem List). Pt will benefit from skilled PT to increase their independence and safety with mobility to allow discharge to the venue listed below.   ?   ?   ? ?Recommendations for follow up therapy are one component of a multi-disciplinary discharge planning process, led by the attending physician.  Recommendations may be updated based on patient status, additional functional criteria and insurance authorization. ? ?Follow Up Recommendations Skilled nursing-short term rehab (<3 hours/day) ? ?  ?Assistance Recommended at Discharge Frequent or constant Supervision/Assistance  ?Patient can return home with the following ? Two people to help with walking and/or transfers;Two people to help with bathing/dressing/bathroom ? ?  ?Equipment Recommendations None recommended by PT  ?Recommendations for Other Services ?    ?  ?Functional Status Assessment Patient has had a recent decline in their functional status and demonstrates the ability to make significant improvements in function in a reasonable and predictable amount of time.  ? ?  ?Precautions / Restrictions Precautions ?Precautions: Fall ?Precaution Comments: L hemiparesis,  AFO ?Restrictions ?Weight Bearing Restrictions: No  ? ?  ? ?Mobility ? Bed Mobility ?Overal bed mobility: Needs Assistance ?Bed Mobility: Supine to Sit, Sit to Supine ?  ?  ?Supine to sit: Max assist, +2 for safety/equipment, +2 for physical assistance ?Sit to supine: Max assist, +2 for physical assistance, +2 for safety/equipment ?  ?General bed mobility comments: patient needed max A x2 for supine to sit with physical assitance for BLE and trunk to come onto midline. ?  ? ?Transfers ?  ?  ?  ?  ?  ?  ?  ?  ?  ?General transfer comment: Pt declined attempting today ?  ? ?Ambulation/Gait ?  ?  ?  ?  ?  ?  ?  ?General Gait Details: non-amb baseline ? ?Stairs ?  ?  ?  ?  ?  ? ?Wheelchair Mobility ?  ? ?Modified Rankin (Stroke Patients Only) ?  ? ?  ? ?Balance Overall balance assessment: Needs assistance ?Sitting-balance support: Feet supported, Single extremity supported ?Sitting balance-Leahy Scale: Poor ?Sitting balance - Comments: Requiring UE support and min A at times; sats EOB for 5-10 mins ?  ?  ?  ?Standing balance comment: declined/unable ?  ?  ?  ?  ?  ?  ?  ?  ?  ?  ?  ?   ? ? ? ?Pertinent Vitals/Pain Pain Assessment ?Pain Assessment: No/denies pain  ? ? ?Home Living Family/patient expects to be discharged to:: Private residence ?Living Arrangements: Spouse/significant other ?Available Help at Discharge: Family;Available 24 hours/day;Personal care attendant (6 days a week for 3-5 hr/day) ?Type of Home: Apartment ?Home Access: Level  entry ?  ?  ?  ?Home Layout: One level ?Home Equipment: Transport chair;Hospital bed;Tub bench;BSC/3in1;Hand held shower head;Grab bars - tub/shower ?Additional Comments: STEDY like device; L AFO  ?  ?Prior Function Prior Level of Function : Needs assist ?  ?  ?  ?  ?  ?  ?Mobility Comments: hasn't walked in a year; uses STEDY for transfer ?ADLs Comments: Assist with all ADLs, has brace of LUE at home ?  ? ? ?Hand Dominance  ? Dominant Hand: Right ? ?  ?Extremity/Trunk Assessment   ? Upper Extremity Assessment ?Upper Extremity Assessment: Defer to OT evaluation (L UE deficits from CVA) ?LUE Deficits / Details: patient has no AROM of LUE. patient was able to tolerate some gentle PROM to digits on L hand with education on hygiene. h/o CVA on this side ?LUE Coordination: decreased fine motor;decreased gross motor ?  ? ?Lower Extremity Assessment ?Lower Extremity Assessment: LLE deficits/detail;RLE deficits/detail ?RLE Deficits / Details: ROM WFL; MMT at least 3/5 but pt fatigued at EOB and unable to further test ?LLE Deficits / Details: ROM WFL; MMT: chronic deficits from CVA, L ankle DF 0/5, L knee 2/5, L hip 1/5 ?  ? ?Cervical / Trunk Assessment ?Cervical / Trunk Assessment: Kyphotic  ?Communication  ? Communication: HOH  ?Cognition Arousal/Alertness: Awake/alert ?Behavior During Therapy: [facility] for tasks assessed/performed ?Overall Cognitive Status: History of cognitive impairments - at baseline ?  ?  ?  ?  ?  ?  ?  ?  ?  ?  ?  ?  ?  ?  ?  ?  ?General Comments: patient was noted to have some confusion over current assist level and what care is available at home. niece was in room and able to provide assist with PLOF information ?  ?  ? ?  ?General Comments   ? ?  ?Exercises    ? ?Assessment/Plan  ?  ?PT Assessment Patient needs continued PT services  ?PT Problem List Decreased strength;Decreased mobility;Decreased range of motion;Decreased coordination;Decreased activity tolerance;Decreased balance;Decreased knowledge of use of DME ? ?   ?  ?PT Treatment Interventions DME instruction;Therapeutic activities;Modalities;Therapeutic exercise;Patient/family education;Balance training;Functional mobility training   ? ?PT Goals (Current goals can be found in the Care Plan section)  ?Acute Rehab PT Goals ?Patient Stated Goal: return home ?PT Goal Formulation: With patient ?Time For Goal Achievement: 02/24/22 ?Potential to Achieve Goals: Good ? ?  ?Frequency Min 3X/week ?  ? ? ?Co-evaluation PT/OT/SLP  Co-Evaluation/Treatment: Yes ?Reason for Co-Treatment: Complexity of the patient's impairments (multi-system involvement);For patient/therapist safety ?PT goals addressed during session: Mobility/safety with mobility ?OT goals addressed during session: ADL's and self-care ?  ? ? ?  ?AM-PAC PT "6 Clicks" Mobility  ?Outcome Measure Help needed turning from your back to your side while in a flat bed without using bedrails?: Total ?Help needed moving from lying on your back to sitting on the side of a flat bed without using bedrails?: Total ?Help needed moving to and from a bed to a chair (including a wheelchair)?: Total ?Help needed standing up from a chair using your arms (e.g., wheelchair or bedside chair)?: Total ?Help needed to walk in hospital room?: Total ?Help needed climbing 3-5 steps with a railing? : Total ?6 Click Score: 6 ? ?  ?End of Session Equipment Utilized During Treatment: Gait belt ?Activity Tolerance: Patient limited by fatigue ?Patient left: in bed;with call bell/phone within reach;with bed alarm set;with family/visitor present ?Nurse Communication: Mobility status ?PT Visit Diagnosis: Muscle  weakness (generalized) (M62.81);Unsteadiness on feet (R26.81) ?  ? ?Time: 3086-5784 ?PT Time Calculation (min) (ACUTE ONLY): 24 min ? ? ?Charges:   PT Evaluation ?$PT Eval Low Complexity: 1 Low ?  ?  ?   ? ? ?Anise Salvo, PT ?Acute Rehab Services ?Pager [phone] ?[facility] [phone] ? ? ?Billey Chang Nicki Gracy ?02/10/2022, 1:38 PM ? ?

## 2022-02-10 NOTE — Progress Notes (Signed)
?PROGRESS NOTE ? ?Robert [name] ZOX:[id number] DOB: [birth year]  ? ?PCP: [facility] ? ?Patient is from: Home.  Lives with his wife.  Uses lift at baseline ? ?DOA: 02/08/2022 LOS: 2 ? ?Chief complaints ?Chief Complaint  ?Patient presents with  ? Hypertension  ?  ? ?Brief Narrative / Interim history: ?76 year old M with PMH of CVA and residual left hemiparesis, BPH/urethral stricture with chronic Foley, Parkinson disease, recurrent UTI, DM-2, HTN and depression brought to ED due to elevated blood pressure and generalized weakness.  SBP reportedly greater than 200 at home.  Vitals stable except for mild tachycardia and tachypnea on arrival.  CMP and CBC without significant finding other than mild hypokalemia.  Lactic acid elevated to 2.6.  UA with large LE and small Hgb.  Blood cultures obtained.  Foley catheter exchanged in ED.  Started on IV ceftriaxone for presumed pyelonephritis/CAUTI.  ? ?Patient remains on IV ceftriaxone.   ? ?Subjective: ?Seen and examined earlier this morning.  No major events overnight of this morning.  Patient has no complaints.  He denies pain, shortness of breath, cough, GI or UTI symptoms. ? ?Objective: ?Vitals:  ? 02/09/22 2100 02/10/22 0517 02/10/22 1120 02/10/22 1320  ?BP: 139/78 (!) 161/91 137/75 (!) 142/74  ?Pulse: 88 83 93 92  ?Resp: '20 18 16 16  '$ ?Temp: 97.7 ?F (36.5 ?C) 97.9 ?F (36.6 ?C)  99 ?F (37.2 ?C)  ?TempSrc: Oral Oral  Oral  ?SpO2: 97% 98% 97% 97%  ?Weight:      ?Height:      ? ? ?Examination: ? ?GENERAL: No apparent distress.  Nontoxic. ?HEENT: MMM.  Vision and hearing grossly intact.  ?NECK: Supple.  No apparent JVD.  ?RESP:  No IWOB.  Fair aeration bilaterally. ?CVS:  RRR. Heart sounds normal.  ?ABD/GI/GU: BS+. Abd soft, NTND.  Indwelling Foley catheter.  Clear urine. ?MSK/EXT:  Moves extremities. No apparent deformity. No edema.  ?SKIN: no apparent skin lesion or wound ?NEURO: Awake, alert and oriented x4.  No apparent focal neuro deficit other than R UE tremor  and left hemiparesis at baseline. ?PSYCH: Calm. Normal affect.  ? ?Procedures:  ?None ? ?Microbiology summarized: ?Blood cultures NGTD. ?Urine culture pending-obtained after urine culture. ? ?Assessment and Plan: ?CAUTI? ?Patient with no fever, leukocytosis, encephalopathy or lower abdominal pain.  UA with pyuria.  He had lactic acidosis.  Blood cultures NGTD.  Foley catheter exchanged.  Urine culture was not sent. ?-Check urine culture although after antibiotics. ?-Continue IV ceftriaxone pending urine culture ?-Hold home Hip-Rex while on ceftriaxone. ? ?History of CVA with residual left hemiparesis ?Stable. ?-Continue Eliquis, aspirin and statin. ?-PT/OT eval ? ?Diabetes mellitus type II, non insulin dependent ([place]) ?Controlled.  A1c 6.1%.  On metformin at home. ?Recent Labs  ?Lab 02/09/22 ?1155 02/09/22 ?1753 02/09/22 ?2135 02/10/22 ?0723 02/10/22 ?1117  ?GLUCAP 145* 165* 122* 144* 127*  ?-Continue SSI ?-Continue statin ? ? ?BPH/urethral stricture with chronic Foley catheter ?Foley exchanged in ED. ?-Continue home Proscar ? ?Uncontrolled hypertension ?Reportedly SBP greater than 200 at home.  Normotensive for most part.  No other medication at home. ?-Metoprolol as needed ? ?Parkinson's disease ([place]) ?RUE tremor at rest.  Stable. ?-Continue Sinemet and Azilect ? ?History of DVT ?Continue Eliquis ? ? ? ?DVT prophylaxis:  ? ?apixaban (ELIQUIS) tablet 5 mg  ?Code Status: Full code ?Family Communication: Updated patient's niece at bedside. ?Level of care: Telemetry ?Status is: Inpatient ?Remains inpatient appropriate because: CAUTI requiring IV antibiotics ? ? ?Final disposition: Likely home  with [facility] vs SNF ?Consultants:  ?None ? ?Sch Meds:  ?Scheduled Meds: ? apixaban  5 mg Oral BID  ? Carbidopa-Levodopa ER  2.5 tablet Oral BID WC  ? And  ? Carbidopa-Levodopa ER  1 tablet Oral Q supper  ? Chlorhexidine Gluconate Cloth  6 each Topical Daily  ? feeding supplement  237 mL Oral BID BM  ? finasteride  5 mg Oral QHS  ?  insulin aspart  0-15 Units Subcutaneous TID WC  ? insulin aspart  0-5 Units Subcutaneous QHS  ? mouth rinse  15 mL Mouth Rinse BID  ? memantine  20 mg Oral QHS  ? pantoprazole  40 mg Oral Daily  ? Pimavanserin Tartrate  34 mg Oral Daily  ? rasagiline  1 mg Oral Daily  ? simvastatin  40 mg Oral q1800  ? ?Continuous Infusions: ? cefTRIAXone (ROCEPHIN)  IV 1 g (02/09/22 2328)  ? lactated ringers with kcl 100 mL/hr at 02/10/22 1101  ? lactated ringers Stopped (02/09/22 0025)  ? ?PRN Meds:.acetaminophen **OR** acetaminophen, benzonatate, menthol-cetylpyridinium, metoprolol tartrate, ondansetron **OR** ondansetron (ZOFRAN) IV ? ?Antimicrobials: ?Anti-infectives (From admission, onward)  ? ? Start     Dose/Rate Route Frequency Ordered Stop  ? 02/08/22 2330  cefTRIAXone (ROCEPHIN) 1 g in sodium chloride 0.9 % 100 mL IVPB       ? 1 g ?200 mL/hr over 30 Minutes Intravenous Every 24 hours 02/08/22 2320    ? ?  ? ? ? ?I have personally reviewed the following labs and images: ?CBC: ?Recent Labs  ?Lab 02/08/22 ?2000 02/09/22 ?3007  ?WBC 10.5 8.9  ?NEUTROABS 8.3*  --   ?HGB 15.2 14.3  ?HCT 47.1 44.2  ?MCV 91.5 90.9  ?PLT 290 262  ? ?BMP &GFR ?Recent Labs  ?Lab 02/08/22 ?2000 02/09/22 ?6226 02/10/22 ?1124  ?NA 137 135 139  ?K 3.4* 5.0 4.1  ?CL 102 101 104  ?CO2 '27 27 30  '$ ?GLUCOSE 148* 83 123*  ?BUN '18 13 13  '$ ?CREATININE 1.11 0.43* 0.93  ?CALCIUM 9.0 9.6 9.1  ? ?Estimated Creatinine Clearance: 81 mL/min (by C-G formula based on SCr of 0.93 mg/dL). ?Liver & Pancreas: ?Recent Labs  ?Lab 02/08/22 ?2000 02/09/22 ?0552  ?AST 7* 30  ?ALT 9 10  ?ALKPHOS 82 59  ?BILITOT 0.9 1.3*  ?PROT 7.2 5.9*  ?ALBUMIN 3.7 2.7*  ? ?No results for input(s): LIPASE, AMYLASE in the last 168 hours. ?No results for input(s): AMMONIA in the last 168 hours. ?Diabetic: ?Recent Labs  ?  02/08/22 ?2000  ?HGBA1C 6.1*  ? ?Recent Labs  ?Lab 02/09/22 ?1155 02/09/22 ?1753 02/09/22 ?2135 02/10/22 ?0723 02/10/22 ?1117  ?GLUCAP 145* 165* 122* 144* 127*  ? ?Cardiac  Enzymes: ?No results for input(s): CKTOTAL, CKMB, CKMBINDEX, TROPONINI in the last 168 hours. ?No results for input(s): PROBNP in the last 8760 hours. ?Coagulation Profile: ?No results for input(s): INR, PROTIME in the last 168 hours. ?Thyroid Function Tests: ?Recent Labs  ?  02/08/22 ?2000  ?TSH 0.940  ? ?Lipid Profile: ?No results for input(s): CHOL, HDL, LDLCALC, TRIG, CHOLHDL, LDLDIRECT in the last 72 hours. ?Anemia Panel: ?No results for input(s): VITAMINB12, FOLATE, FERRITIN, TIBC, IRON, RETICCTPCT in the last 72 hours. ?Urine analysis: ?   ?Component Value Date/Time  ? [place] YELLOW 02/08/2022 2023  ? APPEARANCEUR TURBID (A) 02/08/2022 2023  ? LABSPEC 1.016 02/08/2022 2023  ? PHURINE 8.0 02/08/2022 2023  ? GLUCOSEU NEGATIVE 02/08/2022 2023  ? GLUCOSEU 100 (A) 02/06/2019 0813  ? HGBUR SMALL (A)  02/08/2022 2023  ? [place] NEGATIVE 02/08/2022 2023  ? KETONESUR 5 (A) 02/08/2022 2023  ? PROTEINUR 100 (A) 02/08/2022 2023  ? UROBILINOGEN 1.0 02/06/2019 0813  ? NITRITE NEGATIVE 02/08/2022 2023  ? LEUKOCYTESUR LARGE (A) 02/08/2022 2023  ? ?Sepsis Labs: ?Invalid input(s): PROCALCITONIN, LACTICIDVEN ? ?Microbiology: ?Recent Results (from the past 240 hour(s))  ?Culture, blood (Routine X 2) w Reflex to ID Panel     Status: None (Preliminary result)  ? Collection Time: 02/08/22 10:59 PM  ? Specimen: BLOOD  ?Result Value Ref Range Status  ? Specimen Description   Final  ?  BLOOD RIGHT ANTECUBITAL ?Performed at [facility], [place] [address], [place], [place] [address] ?  ? Special Requests   Final  ?  BOTTLES DRAWN AEROBIC AND ANAEROBIC Blood Culture adequate volume ?Performed at [facility], [place] [address] [place], [place] [address] ?  ? Culture   Final  ?  NO GROWTH 1 DAY ?Performed at [facility], [place] [address] [place], [place] [address] ?  ? Report Status PENDING  Incomplete  ?Culture, blood (Routine X 2) w Reflex to ID Panel     Status: None (Preliminary result)   ? Collection Time: 02/08/22 11:05 PM  ? Specimen: BLOOD  ?Result Value Ref Range Status  ? Specimen Description   Final  ?  BLOOD BLOOD LEFT FOREARM ?Performed at [facility], [place]

## 2022-02-10 NOTE — Evaluation (Signed)
Occupational Therapy Evaluation ?Patient Details ?Name: Robert [name] ?MRN: [id number] ?DOB: 11-06-1945 ?Today's Date: 02/10/2022 ? ? ?History of Present Illness Patient is a 76 year old male who presented to the hosptial with weakenss. patient was found to have sepsis secondary to UTI . PMH: HTN,PD, HLD, Leg DVT, CVA L hemiparesis, dementia  ? ?Clinical Impression ?  ?Patient is a 76 year old male who was admitted for above. Patient was noted to have had a functional decline in ADLs. Patient is +2 for bed mobility on this date. Patient was decreased functional activity tolerance, decreased endurance, decreased sitting balance,  and decreased safety awareness impacting participation in ADLs. Patient would continue to benefit from skilled OT services at this time while admitted and after d/c to address noted deficits in order to improve overall safety and independence in ADLs.  ? ?   ? ?Recommendations for follow up therapy are one component of a multi-disciplinary discharge planning process, led by the attending physician.  Recommendations may be updated based on patient status, additional functional criteria and insurance authorization.  ? ?Follow Up Recommendations ? Home health OT  ?  ?Assistance Recommended at Discharge Frequent or constant Supervision/Assistance  ?Patient can return home with the following Two people to help with walking and/or transfers;Two people to help with bathing/dressing/bathroom;Direct supervision/assist for financial management;Help with stairs or ramp for entrance;Assist for transportation;Direct supervision/assist for medications management;Assistance with cooking/housework;Assistance with feeding ? ?  ?Functional Status Assessment ? Patient has had a recent decline in their functional status and demonstrates the ability to make significant improvements in function in a reasonable and predictable amount of time.  ?Equipment Recommendations ? Other (comment) (patient has needed  equipment at home)  ?  ?Recommendations for Other Services   ? ? ?  ?Precautions / Restrictions Precautions ?Precautions: Fall ?Precaution Comments: L hemiparesis, AFO ?Restrictions ?Weight Bearing Restrictions: No  ? ?  ? ?Mobility Bed Mobility ?Overal bed mobility: Needs Assistance ?Bed Mobility: Supine to Sit, Sit to Supine ?  ?  ?Supine to sit: Max assist, +2 for safety/equipment, +2 for physical assistance ?Sit to supine: Max assist, +2 for physical assistance, +2 for safety/equipment ?  ?General bed mobility comments: patient needed max A x2 for supine to sit with physical assitance for BLE and trunk to come onto midline. ?  ? ?Transfers ?  ?  ?  ?  ?  ?  ?  ?  ?  ?  ?  ? ?  ?Balance Overall balance assessment: Needs assistance ?Sitting-balance support: Feet supported, Single extremity supported ?Sitting balance-Leahy Scale: Poor ?  ?  ?  ?  ?  ?  ?  ?  ?  ?  ?  ?  ?  ?  ?  ?  ?   ? ?ADL either performed or assessed with clinical judgement  ? ?ADL Overall ADL's : Needs assistance/impaired ?Eating/Feeding: Minimal assistance;Bed level ?Eating/Feeding Details (indicate cue type and reason): patient noted to have increased tremor like movement in LUE with patient able to bring cup to mouth with no spillage. h/o increased time to swallow. ?Grooming: Wash/dry face;Minimal assistance;Bed level ?  ?Upper Body Bathing: Maximal assistance;Bed level ?  ?Lower Body Bathing: Bed level;Maximal assistance ?  ?  ?  ?Lower Body Dressing: Bed level;Maximal assistance ?  ?  ?Toilet Transfer Details (indicate cue type and reason): patient was +2 for supine to sit on edge of bed with patient reporting increased dizziness. transfer not attempted at this time. ?Toileting- Clothing  Manipulation and Hygiene: Total assistance;Bed level ?  ?  ?  ?  ?   ? ? ? ?Vision Patient Visual Report: No change from baseline ?   ?   ?Perception   ?  ?Praxis   ?  ? ?Pertinent Vitals/Pain Pain Assessment ?Pain Assessment: No/denies pain  ? ? ? ?Hand  Dominance Right ?  ?Extremity/Trunk Assessment Upper Extremity Assessment ?Upper Extremity Assessment: LUE deficits/detail ?LUE Deficits / Details: patient has no AROM of LUE. patient was able to tolerate some gentle PROM to digits on L hand with education on hygiene. h/o CVA on this side ?LUE Coordination: decreased fine motor;decreased gross motor ?  ?Lower Extremity Assessment ?Lower Extremity Assessment: Defer to PT evaluation ?  ?  ?  ?Communication Communication ?Communication: HOH ?  ?Cognition Arousal/Alertness: Awake/alert ?Behavior During Therapy: [facility] for tasks assessed/performed ?Overall Cognitive Status: History of cognitive impairments - at baseline ?  ?  ?  ?  ?  ?  ?  ?  ?  ?  ?  ?  ?  ?  ?  ?  ?General Comments: patient was noted to have some confusion over current assist level and what care is available at home. niece was in room and able to provide assist with PLOF information ?  ?  ?General Comments    ? ?  ?Exercises   ?  ?Shoulder Instructions    ? ? ?Home Living Family/patient expects to be discharged to:: Private residence ?Living Arrangements: Spouse/significant other ?Available Help at Discharge: Family;Available 24 hours/day;Personal care attendant ?Type of Home: Apartment ?Home Access: Level entry ?  ?  ?Home Layout: One level ?  ?  ?Bathroom Shower/Tub: Tub/shower unit ?  ?Bathroom Toilet: Handicapped height ?  ?  ?Home Equipment: Transport chair;Hospital bed;Tub bench;BSC/3in1;Hand held shower head;Grab bars - tub/shower ?  ?Additional Comments: STEDY like device ?  ? ?  ?Prior Functioning/Environment Prior Level of Function : Needs assist ?  ?  ?  ?  ?  ?  ?Mobility Comments: hasn't walked in a year; uses STEDY for transfer ?ADLs Comments: Assist with all ADLs, has brace of LUE at home ?  ? ?  ?  ?OT Problem List: Decreased activity tolerance;Decreased knowledge of use of DME or AE;Impaired UE functional use;Decreased safety awareness;Decreased knowledge of precautions;Cardiopulmonary  status limiting activity;Impaired balance (sitting and/or standing) ?  ?   ?OT Treatment/Interventions: Self-care/ADL training;DME and/or AE instruction;Therapeutic activities;Balance training;Therapeutic exercise;Neuromuscular education;Energy conservation;Patient/family education  ?  ?OT Goals(Current goals can be found in the care plan section) Acute Rehab OT Goals ?Patient Stated Goal: to go back home ?OT Goal Formulation: With patient/family ?Time For Goal Achievement: 02/24/22 ?Potential to Achieve Goals: Good  ?OT Frequency: Min 2X/week ?  ? ?Co-evaluation PT/OT/SLP Co-Evaluation/Treatment: Yes ?Reason for Co-Treatment: For patient/therapist safety;To address functional/ADL transfers ?PT goals addressed during session: Mobility/safety with mobility ?OT goals addressed during session: ADL's and self-care ?  ? ?  ?AM-PAC OT "6 Clicks" Daily Activity     ?Outcome Measure Help from another person eating meals?: A Little ?Help from another person taking care of personal grooming?: A Little ?Help from another person toileting, which includes using toliet, bedpan, or urinal?: Total ?Help from another person bathing (including washing, rinsing, drying)?: Total ?Help from another person to put on and taking off regular upper body clothing?: Total ?Help from another person to put on and taking off regular lower body clothing?: Total ?6 Click Score: 10 ?  ?End of Session Nurse Communication:  Mobility status ? ?Activity Tolerance: Patient tolerated treatment well ?Patient left: in bed;with call bell/phone within reach;with bed alarm set;with family/visitor present;with nursing/sitter in room ? ?OT Visit Diagnosis: Unsteadiness on feet (R26.81);Other abnormalities of gait and mobility (R26.89);Muscle weakness (generalized) (M62.81);Hemiplegia and hemiparesis ?Hemiplegia - Right/Left: Left  ?              ?Time: 1610-9604 ?OT Time Calculation (min): 28 min ?Charges:  OT General Charges ?$OT Visit: 1 Visit ?OT  Evaluation ?$OT Eval Moderate Complexity: 1 Mod ? ?Miaa Latterell OTR/L, MS ?Acute Rehabilitation Department ?Office# [phone] ?Pager# [phone] ? ? ?Barnabas Lister Mairany Bruno ?02/10/2022, 12:33 PM ?

## 2022-02-10 NOTE — Assessment & Plan Note (Addendum)
Foley exchanged in ED. ?-Continue home Proscar ?

## 2022-02-10 NOTE — Assessment & Plan Note (Signed)
RUE tremor at rest.  Stable. ?-Continue Sinemet and Azilect ?

## 2022-02-11 DIAGNOSIS — A419 Sepsis, unspecified organism: Secondary | ICD-10-CM | POA: Diagnosis not present

## 2022-02-11 DIAGNOSIS — N390 Urinary tract infection, site not specified: Secondary | ICD-10-CM | POA: Diagnosis not present

## 2022-02-11 LAB — CBC
HCT: 45.1 % (ref 39.0–52.0)
Hemoglobin: 14.6 g/dL (ref 13.0–17.0)
MCH: 29.9 pg (ref 26.0–34.0)
MCHC: 32.4 g/dL (ref 30.0–36.0)
MCV: 92.4 fL (ref 80.0–100.0)
Platelets: 248 10*3/uL (ref 150–400)
RBC: 4.88 MIL/uL (ref 4.22–5.81)
RDW: 13.7 % (ref 11.5–15.5)
WBC: 6.2 10*3/uL (ref 4.0–10.5)
nRBC: 0 % (ref 0.0–0.2)

## 2022-02-11 LAB — LACTIC ACID, PLASMA
Lactic Acid, Venous: 0.9 mmol/L (ref 0.5–1.9)
Lactic Acid, Venous: 1.2 mmol/L (ref 0.5–1.9)

## 2022-02-11 LAB — RENAL FUNCTION PANEL
Albumin: 3.2 g/dL — ABNORMAL LOW (ref 3.5–5.0)
Anion gap: 6 (ref 5–15)
BUN: 15 mg/dL (ref 8–23)
CO2: 29 mmol/L (ref 22–32)
Calcium: 9.2 mg/dL (ref 8.9–10.3)
Chloride: 103 mmol/L (ref 98–111)
Creatinine, Ser: 0.92 mg/dL (ref 0.61–1.24)
GFR, Estimated: >60 mL/min (ref >60)
Glucose, Bld: 138 mg/dL — ABNORMAL HIGH (ref 70–99)
Phosphorus: 3.1 mg/dL (ref 2.5–4.6)
Potassium: 3.9 mmol/L (ref 3.5–5.1)
Sodium: 138 mmol/L (ref 135–145)

## 2022-02-11 LAB — URINE CULTURE: Culture: <10000 — AB

## 2022-02-11 LAB — MAGNESIUM: Magnesium: 2 mg/dL (ref 1.7–2.4)

## 2022-02-11 LAB — GLUCOSE, CAPILLARY
Glucose-Capillary: 123 mg/dL — ABNORMAL HIGH (ref 70–99)
Glucose-Capillary: 151 mg/dL — ABNORMAL HIGH (ref 70–99)
Glucose-Capillary: 136 mg/dL — ABNORMAL HIGH (ref 70–99)
Glucose-Capillary: 159 mg/dL — ABNORMAL HIGH (ref 70–99)

## 2022-02-11 MED ORDER — ENSURE ENLIVE PO LIQD: 237.0000 mL | Freq: Two times a day (BID) | ORAL | 0 refills | Status: DC

## 2022-02-11 MED ORDER — CEFDINIR 300 MG PO CAPS: 300.0000 mg | ORAL_CAPSULE | Freq: Two times a day (BID) | ORAL | 0 refills | Status: DC

## 2022-02-11 MED ORDER — CEFDINIR 300 MG PO CAPS
300.0000 mg | ORAL_CAPSULE | Freq: Two times a day (BID) | ORAL | Status: DC
  Administered 2022-02-11 – 2022-02-12 (×3): 300 mg via ORAL
  Filled 2022-02-11 (×4): qty 1

## 2022-02-11 MED ORDER — CHLORHEXIDINE GLUCONATE CLOTH 2 % EX PADS
6.0000 | MEDICATED_PAD | Freq: Every day | CUTANEOUS | Status: DC
  Administered 2022-02-12: 6 via TOPICAL

## 2022-02-11 MED ORDER — SACCHAROMYCES BOULARDII 250 MG PO CAPS
250.0000 mg | ORAL_CAPSULE | Freq: Two times a day (BID) | ORAL | Status: DC
  Administered 2022-02-11 – 2022-02-12 (×3): 250 mg via ORAL
  Filled 2022-02-11 (×3): qty 1

## 2022-02-11 MED ORDER — SACCHAROMYCES BOULARDII 250 MG PO CAPS
250.0000 mg | ORAL_CAPSULE | Freq: Two times a day (BID) | ORAL | 0 refills | Status: AC
Start: 2022-02-11 — End: 2022-02-18

## 2022-02-11 MED ORDER — MUPIROCIN 2 % EX OINT
1.0000 "application " | TOPICAL_OINTMENT | Freq: Two times a day (BID) | CUTANEOUS | Status: DC
  Administered 2022-02-11 – 2022-02-12 (×2): 1 via NASAL
  Filled 2022-02-11: qty 22

## 2022-02-11 NOTE — TOC Progression Note (Signed)
Transition of Care (TOC) - Progression Note  ? ? ?Patient Details  ?Name: Maddax Palinkas ?MRN: [id number] ?Date of Birth: 1946/09/18 ? ?Transition of Care (TOC) CM/SW Contact  ?Cristan Scherzer, Marjie Skiff, RN ?Phone Number: ?02/11/2022, 10:42 AM ? ?Clinical Narrative:    ?Spoke with pt and wife at bedside for dc planning. Physical therapy recommendations discussed. Wife politely declines SNF and says that they have everything at home including home health services. She states that they use [facility] for home PT/RN and they have a Sit to Stand unit, transport chair, hospital bed at home already. [facility] liaison alerted of need to continue services at dc. Pt will need ambulance transport home.  ? ? ?Expected Discharge Plan: [place] ?Barriers to Discharge: Continued Medical Work up ? ?Expected Discharge Plan and Services ?Expected Discharge Plan: [place] ?  ?Discharge Planning Services: CM Consult ?Post Acute Care Choice: Home Health ?Living arrangements for the past 2 months: Apartment ?                ?  ?  ?  ?  ?  ?  ?[place] Agency: [facility] ([place]) ?Date HH Agency Contacted: 02/11/22 ?Time [place]: [id number] ?Representative spoke with at [place]: Ramond Marrow ? ? ?Social Determinants of Health (SDOH) Interventions ?  ? ?Readmission Risk Interventions ? ?  02/11/2022  ? 10:35 AM 08/16/2019  ? 12:05 PM  ?Readmission Risk Prevention Plan  ?Post Dischage Appt  Not Complete  ?Appt Comments  disposition tbd  ?Medication Screening  Complete  ?Transportation Screening Complete Complete  ?PCP or Specialist Appt within 5-7 Days Complete   ?Home Care Screening Complete   ?Medication Review (RN CM) Complete   ? ? ?

## 2022-02-11 NOTE — Progress Notes (Signed)
?  Progress Note ?Patient: Robert [name] HGD:[id number] DOB: [birth year] DOA: 02/08/2022  ?DOS: the patient was seen and examined on 02/11/2022 ? ?Brief hospital course: ?76 year old M with PMH of CVA and residual left hemiparesis, BPH/urethral stricture with chronic Foley, Parkinson disease, recurrent UTI, DM-2, HTN and depression brought to ED due to elevated blood pressure and generalized weakness.  SBP reportedly greater than 200 at home.  Vitals stable except for mild tachycardia and tachypnea on arrival.  CMP and CBC without significant finding other than mild hypokalemia.  Lactic acid elevated to 2.6.  UA with large LE and small Hgb.  Blood cultures obtained.  Foley catheter exchanged in ED.  Started on IV ceftriaxone for presumed pyelonephritis/CAUTI.  ? ?Patient remains on IV ceftriaxone.  ?Assessment and Plan: ?CAUTI? ?Patient with no fever, leukocytosis, encephalopathy or lower abdominal pain.  UA with pyuria.  He had lactic acidosis.  Blood cultures NGTD.  Foley catheter exchanged.  Urine culture was not sent. ?-Insignificant urine culture although it was sent after 2 doses of antibiotics. ?-Continue antibiotics, changed to oral Ancef and monitor. ?-Hold home Hip-Rex while on ceftriaxone. ? ?History of CVA with residual left hemiparesis ?Stable. ?-Continue Eliquis, aspirin and statin. ?-PT/OT eval ? ?Diabetes mellitus type II, non insulin dependent ([place]) ?Controlled.  A1c 6.1%.  On metformin at home. ?-Continue SSI ?-Continue statin ? ?BPH/urethral stricture with chronic Foley catheter ?Foley exchanged in ED. ?-Continue home Proscar ? ?Uncontrolled hypertension ?Reportedly SBP greater than 200 at home.  Normotensive for most part.  No other medication at home. ?-Metoprolol as needed ? ?Parkinson's disease ([place]) ?RUE tremor at rest.  Stable. ?-Continue Sinemet and Azilect ? ?History of DVT ?Continue Eliquis ? ?Right buttock stage II pressure ulcer. ?Present on admission. ?Continue foam dressing. ? ?Subjective:  No nausea no vomiting no fever no chills.  Continues to have fatigue and tiredness.  PT recommends SNF although patient denies. ? ?Physical Exam: ?Vitals:  ? 02/10/22 1120 02/10/22 1320 02/11/22 0621 02/11/22 1430  ?BP: 137/75 (!) 142/74 139/81 128/71  ?Pulse: 93 92 76 93  ?Resp: '16 16 14 18  '$ ?Temp:  99 ?F (37.2 ?C) 98.2 ?F (36.8 ?C) 98.4 ?F (36.9 ?C)  ?TempSrc:  Oral Oral Oral  ?SpO2: 97% 97% 97% 95%  ?Weight:      ?Height:      ? ?General: Appear in mild distress; no visible Abnormal Neck Mass Or lumps, Conjunctiva normal ?Cardiovascular: S1 and S2 Present, no Murmur, ?Respiratory: good respiratory effort, Bilateral Air entry present and CTA, no Crackles, no wheezes ?Abdomen: Bowel Sound present, Non tender  ?Extremities: no Pedal edema ?Neurology: alert and oriented to time, place, and person ?Gait not checked due to patient safety concerns  ? ?Data Reviewed: ?I have Reviewed nursing notes, Vitals, and Lab results since pt's last encounter. Pertinent lab results CBC and BMP ?I have ordered test including BMP   ? ?Family Communication: Wife at bedside ? ?Disposition: ?Status is: Inpatient ?Remains inpatient appropriate because: We will observe overnight and monitor for improvement in fever curve. ? ?Author: ?Berle Mull, MD ?02/11/2022 8:29 PM ? ?Please look on [email or web address] to find out who is on call. ?

## 2022-02-11 NOTE — Care Management Important Message (Signed)
Important Message ? ?Patient Details IM Letter placed in Patients room. ?Name: Robert [name] ?MRN: [id number] ?Date of Birth: 01/05/1946 ? ? ?Medicare Important Message Given:  Yes ? ? ? ? ?[name] ?02/11/2022, 12:28 PM ?

## 2022-02-12 LAB — GLUCOSE, CAPILLARY: Glucose-Capillary: 170 mg/dL — ABNORMAL HIGH (ref 70–99)

## 2022-02-12 MED ORDER — CEFDINIR 300 MG PO CAPS: 300.0000 mg | ORAL_CAPSULE | Freq: Two times a day (BID) | ORAL | 0 refills | Status: AC

## 2022-02-12 NOTE — TOC Transition Note (Signed)
Transition of Care (TOC) - CM/SW Discharge Note ? ? ?Patient Details  ?Name: Robert [name] ?MRN: [id number] ?Date of Birth: 05-22-1946 ? ?Transition of Care (TOC) CM/SW Contact:  ?Adonica Fukushima, Marjie Skiff, RN ?Phone Number: ?02/12/2022, 9:24 AM ? ? ?Clinical Narrative:    ?[facility] called for transport home. ? ? ?Final next level of care: [place] ?Barriers to Discharge: Continued Medical Work up ? ? ?Patient Goals and CMS Choice ?Patient states their goals for this hospitalization and ongoing recovery are:: To go home ?CMS Medicare.gov Compare Post Acute Care list provided to:: Patient ?Choice offered to / list presented to : Patient ? ?Discharge Plan and Services ?  ?Discharge Planning Services: CM Consult ?Post Acute Care Choice: Home Health          ?  ?   ?  ?[place] Agency: [facility] ([place]) ?Date HH Agency Contacted: 02/11/22 ?Time [place]: 4503 ?Representative spoke with at [place]: Ramond Marrow ? ?Social Determinants of Health (SDOH) Interventions ?  ? ? ?Readmission Risk Interventions ? ?  02/11/2022  ? 10:35 AM 08/16/2019  ? 12:05 PM  ?Readmission Risk Prevention Plan  ?Post Dischage Appt  Not Complete  ?Appt Comments  disposition tbd  ?Medication Screening  Complete  ?Transportation Screening Complete Complete  ?PCP or Specialist Appt within 5-7 Days Complete   ?Home Care Screening Complete   ?Medication Review (RN CM) Complete   ? ? ? ? ? ?

## 2022-02-14 LAB — CULTURE, BLOOD (ROUTINE X 2)
Culture: NO GROWTH
Culture: NO GROWTH
Special Requests: ADEQUATE
Special Requests: ADEQUATE

## 2022-02-14 NOTE — Discharge Summary (Signed)
?Physician Discharge Summary ?  ?Patient: Robert [name] MRN: [id number] DOB: 18-Feb-1946  ?Admit date:     02/08/2022  ?Discharge date: 02/12/2022  ?Discharge Physician: Berle Mull  ?PCP: [facility] ? ?Recommendations at discharge: ?Follow-up with PCP as recommended.  Follow-up with urology as recommended. ? ?Discharge Diagnoses: ?Principal Problem: ?  Sepsis secondary to UTI [facility]) ?Active Problems: ?  CAUTI? ?  History of CVA with residual left hemiparesis ?  Uncontrolled hypertension ?  BPH/urethral stricture with chronic Foley catheter ?  Diabetes mellitus type II, non insulin dependent (Robert [name]) ?  Hyperlipidemia ?  History of DVT ?  Parkinson's disease (Robert [name]) ?  Pressure injury of skin ?  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 ? ?Hospital Course: ?76 year old M with PMH of CVA and residual left hemiparesis, BPH/urethral stricture with chronic Foley, Parkinson disease, recurrent UTI, DM-2, HTN and depression brought to ED due to elevated blood pressure and generalized weakness.  SBP reportedly greater than 200 at home.  Vitals stable except for mild tachycardia and tachypnea on arrival.  CMP and CBC without significant finding other than mild hypokalemia.  Lactic acid elevated to 2.6.  UA with large LE and small Hgb.  Blood cultures obtained.  Foley catheter exchanged in ED.  Started on IV ceftriaxone for presumed pyelonephritis/CAUTI.  ? ?Patient remains on IV ceftriaxone.  ? ?Assessment and Plan: ?CAUTI? ?Patient with no fever, leukocytosis, encephalopathy or lower abdominal pain.  UA with pyuria.  He had lactic acidosis.  Blood cultures NGTD.  Foley catheter exchanged.  Urine culture was not sent. ?Continue antibiotic on discharge. ? ?History of CVA with residual left hemiparesis ?Stable. ?-Continue Eliquis, aspirin and statin. ?-PT/OT eval ? ?Diabetes mellitus type II, non insulin dependent (Robert [name]) ?Controlled.  A1c 6.1%.  On metformin at home. ?Continue statin.  Continue home  regimen. ? ?BPH/urethral stricture with chronic Foley catheter ?Foley exchanged in ED. ?-Continue home Proscar ? ?Uncontrolled hypertension ?Reportedly SBP greater than 200 at home.  Normotensive for most part.  No other medication at home. ? ?Parkinson's disease (Robert [name]) ?RUE tremor at rest.  Stable. ?-Continue Sinemet and Azilect ? ?History of DVT ?Continue Eliquis ? ?Consultants: none ?Procedures performed:  ?none ?DISCHARGE MEDICATION: ?Allergies as of 02/12/2022   ? ?   Reactions  ? Propofol Other (See Comments)  ? "hiccups for weeks" ?Other reaction(s): Hiccups  ? Lisinopril Cough  ? Sulfamethoxazole-trimethoprim Diarrhea  ? ?  ? ?  ?Medication List  ?  ? ?STOP taking these medications   ? ?mirabegron ER 25 MG Tb24 tablet ?Commonly known as: MYRBETRIQ ?  ?sertraline 50 MG tablet ?Commonly known as: ZOLOFT ?  ? ?  ? ?TAKE these medications   ? ?ACIDOPHILUS LACTOBACILLUS PO ?Take 1 tablet by mouth at bedtime. ?  ?apixaban 5 MG Tabs tablet ?Commonly known as: ELIQUIS ?Take 1 tablet (5 mg total) by mouth 2 (two) times daily. ?  ?aspirin EC 81 MG tablet ?Take 81 mg by mouth daily. Swallow whole. ?  ?benzonatate 100 MG capsule ?Commonly known as: TESSALON ?Take 100 mg by mouth 3 (three) times daily as needed for cough. ?  ?Carbidopa-Levodopa ER 25-100 MG tablet controlled release ?Commonly known as: SINEMET CR ?Take 2 1/2 tablets by mouth twice a day and take 1 tablet once a day with supper - taken at 0900, 1300, 1700 ?  ?cefdinir 300 MG capsule ?Commonly known as: OMNICEF ?Take 1 capsule (300 mg total) by mouth 2 (two) times daily for 4 days. ?  ?  Cholecalciferol 50 MCG (2000 UT) Tabs ?Take 2,000 Units by mouth daily. ?  ?feeding supplement Liqd ?Take 237 mLs by mouth 2 (two) times daily between meals. ?  ?finasteride 5 MG tablet ?Commonly known as: PROSCAR ?Take 5 mg by mouth at bedtime. ?  ?Fluocinolone Acetonide Body 0.01 % Oil ?Apply 1 application. topically See admin instructions. Apply thin film to the scalp 1-2  times daily as needed for itching ?  ?hydrOXYzine 10 MG tablet ?Commonly known as: ATARAX ?Take 10 mg by mouth 3 (three) times daily as needed. ?  ?memantine 10 MG tablet ?Commonly known as: NAMENDA ?Take 20 mg by mouth at bedtime. ?  ?metFORMIN 500 MG tablet ?Commonly known as: GLUCOPHAGE ?Take 500 mg by mouth at bedtime. ?  ?methenamine 1 g tablet ?Commonly known as: HIPREX ?Take 1 g by mouth 2 (two) times daily. for infection prevention ?  ?Nuplazid 34 MG Caps ?Generic drug: Pimavanserin Tartrate ?Take 34 mg by mouth daily. ?  ?nystatin-triamcinolone ointment ?Commonly known as: MYCOLOG ?Apply 1 application. topically See admin instructions. Apply thin film to groin and leg twice a day ?  ?omeprazole 20 MG capsule ?Commonly known as: PRILOSEC ?Take 20 mg by mouth daily. ?  ?ondansetron 8 MG tablet ?Commonly known as: ZOFRAN ?Take 4 mg by mouth daily as needed for nausea. ?  ?Marketing executive w/Device Kit ?check blood sugar ?  ?polyethylene glycol 17 g packet ?Commonly known as: MIRALAX / GLYCOLAX ?Take 17 g by mouth daily as needed for mild constipation. ?  ?rasagiline 1 MG Tabs tablet ?Commonly known as: AZILECT ?Take 1 mg by mouth daily. ?  ?saccharomyces boulardii 250 MG capsule ?Commonly known as: FLORASTOR ?Take 1 capsule (250 mg total) by mouth 2 (two) times daily for 7 days. ?  ?simvastatin 40 MG tablet ?Commonly known as: ZOCOR ?Take 40 mg by mouth at bedtime. ?  ?tacrolimus 0.1 % ointment ?Commonly known as: PROTOPIC ?Apply 1 application. topically See admin instructions. Apply to groin, armpit, and bottom when skin is broken out twice a day as needed ?  ? ?  ? ? ?Disposition: Home ?Diet recommendation: Cardiac diet ? ?Discharge Exam: ?Vitals:  ? 02/11/22 0621 02/11/22 1430 02/11/22 2042 02/12/22 0524  ?BP: 139/81 128/71 (!) 141/80 132/77  ?Pulse: 76 93 76 75  ?Resp: $Remov'14 18 18 18  'PaogyF$ ?Temp: 98.2 ?F (36.8 ?C) 98.4 ?F (36.9 ?C) 98.6 ?F (37 ?C) 98.2 ?F (36.8 ?C)  ?TempSrc: Oral Oral Oral Oral  ?SpO2:  97% 95% 97% 96%  ?Weight:      ?Height:      ? ?General: Appear in no distress; no visible Abnormal Neck Mass Or lumps, Conjunctiva normal ?Cardiovascular: S1 and S2 Present, no Murmur, ?Respiratory: good respiratory effort, Bilateral Air entry present and CTA, no Crackles, no wheezes ?Abdomen: Bowel Sound present, Non tender ?Extremities: Resting tremors, no pedal edema ?Neurology: alert and oriented to time, place, and person ?Gait not checked due to patient safety concerns ?Filed Weights  ? 02/08/22 1758 02/09/22 0412  ?Weight: 98.9 kg 98.4 kg  ? ?Condition at discharge: stable ? ?The results of significant diagnostics from this hospitalization (including imaging, microbiology, ancillary and laboratory) are listed below for reference.  ? ?Imaging Studies: ?DG Chest Port 1 View ? ?Result Date: 02/08/2022 ?CLINICAL DATA:  Fever. EXAM: PORTABLE CHEST 1 VIEW COMPARISON:  January 04, 2021 FINDINGS: The heart size and mediastinal contours are within normal limits. Mild, chronic appearing increased lung markings are seen. There is no  evidence of acute infiltrate, pleural effusion or pneumothorax. Multilevel degenerative changes seen throughout the thoracic spine. IMPRESSION: No active cardiopulmonary disease. Electronically Signed   By: Virgina Norfolk M.D.   On: 02/08/2022 23:54   ? ?Microbiology: ?Results for orders placed or performed during the hospital encounter of 02/08/22  ?Culture, blood (Routine X 2) w Reflex to ID Panel     Status: None  ? Collection Time: 02/08/22 10:59 PM  ? Specimen: BLOOD  ?Result Value Ref Range Status  ? Specimen Description   Final  ?  BLOOD RIGHT ANTECUBITAL ?Performed at [facility], [place] [address] [place], [place] [address] ?  ? Special Requests   Final  ?  BOTTLES DRAWN AEROBIC AND ANAEROBIC Blood Culture adequate volume ?Performed at [facility], [place] [address], [place], [place] [address] ?  ? Culture   Final  ?  NO GROWTH 5 DAYS ?Performed  at [facility], [place] [address] [place], [place] [address] ?  ? Report Status 02/14/2022 FINAL  Final  ?Culture, blood (Routine X 2) w Reflex to ID Panel     Status: None  ? Collection Time: 02/08/22 11

## 2022-02-15 DIAGNOSIS — K219 Gastro-esophageal reflux disease without esophagitis: Secondary | ICD-10-CM | POA: Diagnosis not present

## 2022-02-15 DIAGNOSIS — G20 Parkinson's disease: Secondary | ICD-10-CM | POA: Diagnosis not present

## 2022-02-15 DIAGNOSIS — N312 Flaccid neuropathic bladder, not elsewhere classified: Secondary | ICD-10-CM | POA: Diagnosis not present

## 2022-02-15 DIAGNOSIS — G8194 Hemiplegia, unspecified affecting left nondominant side: Secondary | ICD-10-CM | POA: Diagnosis not present

## 2022-02-15 DIAGNOSIS — I639 Cerebral infarction, unspecified: Secondary | ICD-10-CM | POA: Diagnosis not present

## 2022-03-13 ENCOUNTER — Encounter: Payer: Medicare Other | Admitting: Physical Medicine & Rehabilitation

## 2022-03-17 ENCOUNTER — Ambulatory Visit (INDEPENDENT_AMBULATORY_CARE_PROVIDER_SITE_OTHER): Payer: Medicare Other | Admitting: Vascular Surgery

## 2022-03-17 ENCOUNTER — Ambulatory Visit (HOSPITAL_COMMUNITY)
Admission: RE | Admit: 2022-03-17 | Discharge: 2022-03-17 | Disposition: A | Discharge status: Home / Self Care | Payer: Medicare Other | Source: Ambulatory Visit | Attending: Vascular Surgery | Admitting: Vascular Surgery

## 2022-03-17 ENCOUNTER — Encounter: Payer: Self-pay | Admitting: Vascular Surgery

## 2022-03-17 VITALS — BP 133/74 | HR 61 | Temp 97.2°F | Resp 18 | Ht 74.0 in | Wt 218.0 lb

## 2022-03-17 DIAGNOSIS — I6522 Occlusion and stenosis of left carotid artery: Secondary | ICD-10-CM | POA: Diagnosis not present

## 2022-03-17 NOTE — Progress Notes (Signed)
Patient name: Robert [name] MRN: [id number] DOB: 03-Oct-1946 Sex: male  REASON FOR VISIT: 63 month follow-up after left TCAR  HPI: Robert [name] is a 76 y.o. male with multiple medical problems that presents for 9 month follow-up after left TCAR.  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  He did well during the procedure and woke up neurologically at his baseline.  Unfortunately on the evening after his TCAR he was noted to have some right-sided weakness and a code stroke was called.  He was found to have some small left MCA and basal ganglia infarct.  Fortunately he recovered quickly and went to rehab.   He is here today with his wife.  He is still doing physical therapy.  Wife states he has had more trouble ambulating but is still able to stand.  Taking Eliquis and aspirin.  No new neurologic symptoms since hospital discharge last year.  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   Past Medical History:  Diagnosis Date   Anemia    BPH (benign prostatic hyperplasia)    CVA (cerebral vascular accident) ([place])    with left sided hemiparesis   Diabetes mellitus without complication ([place])    Diverticulosis    Frequency of urination    GERD (gastroesophageal reflux disease)    Gross hematuria    History of acute pyelonephritis    10-13-2012   History of CVA with residual deficit    2008--  left side of body weakness and foot drop (wears leg brace and uses cane)   History of DVT of lower extremity    2008--  cva   Hypercholesteremia    Hyperlipidemia    Hyperlipidemia    Hypertension    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 2008   Left leg DVT ([place])    Neuromuscular disorder (HCC)    Parkinsons   S/P insertion of IVC (inferior vena caval) filter    2008   Urethral stricture    Urgency of urination    Urinary retention    Weakness of left  side of body    secondary to cva 2008   Wears glasses    Wears hearing aid    bilateral-- wears intermittantly    Past Surgical History:  Procedure Laterality Date   CYSTOSCOPY WITH RETROGRADE URETHROGRAM N/A 10/23/2015   Procedure: CYSTOSCOPY WITH RETROGRADE URETHROGRAM;  Surgeon: [name], MD;  Location: [facility];  Service: Urology;  Laterality: N/A;   CYSTOSCOPY WITH URETHRAL DILATATION N/A 10/23/2015   Procedure: CYSTOSCOPY WITH URETHRAL BALLOON DILATATION;  Surgeon: [name], MD;  Location: [facility];  Service: Urology;  Laterality: N/A;  BALLOON DILATION    IVC FILTER PLACEMENT ([place] [facility])  2008   LAPAROSCOPIC CHOLECYSTECTOMY SINGLE PORT N/A 01/09/2021   Procedure: LAPAROSCOPIC CHOLECYSTECTOMY WITH IOC;  Surgeon: [name], MD;  Location: [facility];  Service: General;  Laterality: N/A;  90 MIN   TRANSCAROTID ARTERY REVASCULARIZATION  Left 05/12/2021   Procedure: LEFT TRANSCAROTID ARTERY REVASCULARIZATION;  Surgeon: [name], MD;  Location: [facility] OR;  Service: Vascular;  Laterality: Left;    Family History  Problem Relation Age of Onset   Diabetes Sister    Lung cancer Brother        Post 9/11 voluntary work in [employer]   Prostate Elbert  Other Mother        passed away young- non medical   Alzheimer's disease Father    Healthy Son     SOCIAL HISTORY: Social History   Tobacco Use   Smoking status: Former    Years: 10.00    Types: Cigarettes    Quit date: 10/26/1970    Years since quitting: 51.4   Smokeless tobacco: Never  Substance Use Topics   Alcohol use: No    Allergies  Allergen Reactions   Propofol Other (See Comments)    "hiccups for weeks"  Other reaction(s): Hiccups   Lisinopril Cough   Sulfamethoxazole-Trimethoprim Diarrhea    Current Outpatient Medications  Medication Sig Dispense Refill   Acidophilus Lactobacillus CAPS See admin instructions.     ACIDOPHILUS LACTOBACILLUS PO Take 1 tablet  by mouth at bedtime.     apixaban (ELIQUIS) 5 MG TABS tablet Take 1 tablet (5 mg total) by mouth 2 (two) times daily. 60 tablet 0   ascorbic acid (VITAMIN C) 500 MG tablet TAKE ONE TABLET BY MOUTH DAILY FOR PREVENTION OF UTI     aspirin EC 81 MG tablet Take 81 mg by mouth daily. Swallow whole.     benzonatate (TESSALON) 100 MG capsule Take 100 mg by mouth 3 (three) times daily as needed for cough.     Blood Glucose Monitoring Suppl (ONETOUCH VERIO FLEX SYSTEM) w/Device KIT check blood sugar     Carbidopa-Levodopa ER (SINEMET CR) 25-100 MG tablet controlled release Take 2 1/2 tablets by mouth twice a day and take 1 tablet once a day with supper - taken at 0900, 1300, 1700     cephALEXin (KEFLEX) 500 MG capsule TAKE ONE CAPSULE BY MOUTH TWICE A DAY FOR UTI     Cholecalciferol (VITAMIN D3) 50 MCG (2000 UT) TABS 1 tablet     Cholecalciferol 50 MCG (2000 UT) TABS Take 2,000 Units by mouth daily.     feeding supplement (ENSURE ENLIVE / ENSURE PLUS) LIQD Take 237 mLs by mouth 2 (two) times daily between meals. 10000 mL 0   finasteride (PROSCAR) 5 MG tablet Take 5 mg by mouth at bedtime.     Fluocinolone Acetonide Body 0.01 % OIL Apply 1 application. topically See admin instructions. Apply thin film to the scalp 1-2 times daily as needed for itching     hydrOXYzine (ATARAX) 10 MG tablet Take 10 mg by mouth 3 (three) times daily as needed.     memantine (NAMENDA) 10 MG tablet Take 20 mg by mouth at bedtime.     metFORMIN (GLUCOPHAGE) 500 MG tablet Take 500 mg by mouth at bedtime.     methenamine (HIPREX) 1 g tablet Take 1 g by mouth 2 (two) times daily. for infection prevention     methenamine (HIPREX) 1 g tablet 1 tablet     nystatin-triamcinolone ointment (MYCOLOG) Apply 1 application. topically See admin instructions. Apply thin film to groin and leg twice a day     omeprazole (PRILOSEC) 20 MG capsule Take 20 mg by mouth daily.     ondansetron (ZOFRAN) 8 MG tablet Take 4 mg by mouth daily as needed for  nausea.     Pimavanserin Tartrate (NUPLAZID) 34 MG CAPS Take 34 mg by mouth daily.     polyethylene glycol (MIRALAX / GLYCOLAX) 17 g packet Take 17 g by mouth daily as needed for mild constipation.     rasagiline (AZILECT) 1 MG TABS tablet Take 1 mg by mouth daily.  simvastatin (ZOCOR) 40 MG tablet Take 40 mg by mouth at bedtime.     tacrolimus (PROTOPIC) 0.1 % ointment Apply 1 application. topically See admin instructions. Apply to groin, armpit, and bottom when skin is broken out twice a day as needed     No current facility-administered medications for this visit.    REVIEW OF SYSTEMS:  $RemoveB'[X]'qwzrjHNs$  denotes positive finding, $RemoveBeforeDEI'[ ]'sJmFYiaRwIRxVAYQ$  denotes negative finding Cardiac  Comments:  Chest pain or chest pressure:    Shortness of breath upon exertion:    Short of breath when lying flat:    Irregular heart rhythm:        Vascular    Pain in calf, thigh, or hip brought on by ambulation:    Pain in feet at night that wakes you up from your sleep:     Blood clot in your veins:    Leg swelling:         Pulmonary    Oxygen at home:    Productive cough:     Wheezing:         Neurologic    Sudden weakness in arms or legs:     Sudden numbness in arms or legs:     Sudden onset of difficulty speaking or slurred speech:    Temporary loss of vision in one eye:     Problems with dizziness:         Gastrointestinal    Blood in stool:     Vomited blood:         Genitourinary    Burning when urinating:     Blood in urine:        Psychiatric    Major depression:         Hematologic    Bleeding problems:    Problems with blood clotting too easily:        Skin    Rashes or ulcers:        Constitutional    Fever or chills:      PHYSICAL EXAM: Vitals:   03/17/22 1103  BP: 133/74  Pulse: 61  Resp: 18  Temp: (!) 97.2 F (36.2 C)  TempSrc: Temporal  Weight: 218 lb (98.9 kg)  Height: $Remove'6\' 2"'hWkLnyl$  (1.88 m)    GENERAL: The patient is a well-nourished male, in no acute distress. The vital signs  are documented above. CARDIAC: There is a regular rate and rhythm.  VASCULAR:  Left neck incision c/d/i PULMONARY: No respiratory distress. MUSCULOSKELETAL: There are no major deformities or cyanosis. NEUROLOGIC:  Left upper extremity 0 out of 5 but able to lift his left leg off the wheelchair from his previous stroke years ago Right upper extremity 4+ out of 5 and right lower extremity 4+ out of 5  DATA:   Carotid duplex today shows known right ICA occlusion and a widely patent left carotid stent after TCAR.  Assessment/Plan:  76 year old male status post left TCAR on 05/02/21 for asymptomatic high-grade greater than 80% stenosis in the setting of contralateral ICA occlusion.  He had a small stroke in the hospital with small left MCA/basal ganglia infarcts after the TCAR.  Fortunately he went to CIR and was able to rehabilitate.  Still doing outpatient physical therapy.  Overall he looks pretty good to me today.  Discussed his duplex today shows widely patent left carotid stent following his TCAR last year.  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  Discussed I will see  him again in 1 year with duplex.  He needs to continue aspirin from my standpoint and is on Eliquis aspirin.  [name], MD [facility] Office: [phone]

## 2022-03-24 DIAGNOSIS — G8194 Hemiplegia, unspecified affecting left nondominant side: Secondary | ICD-10-CM | POA: Diagnosis not present

## 2022-03-24 DIAGNOSIS — N312 Flaccid neuropathic bladder, not elsewhere classified: Secondary | ICD-10-CM | POA: Diagnosis not present

## 2022-03-24 DIAGNOSIS — G20 Parkinson's disease: Secondary | ICD-10-CM | POA: Diagnosis not present

## 2022-03-24 DIAGNOSIS — K219 Gastro-esophageal reflux disease without esophagitis: Secondary | ICD-10-CM | POA: Diagnosis not present

## 2022-03-24 DIAGNOSIS — I639 Cerebral infarction, unspecified: Secondary | ICD-10-CM | POA: Diagnosis not present

## 2022-04-10 DIAGNOSIS — E1169 Type 2 diabetes mellitus with other specified complication: Secondary | ICD-10-CM | POA: Diagnosis not present

## 2022-04-10 DIAGNOSIS — E785 Hyperlipidemia, unspecified: Secondary | ICD-10-CM | POA: Diagnosis not present

## 2022-04-10 DIAGNOSIS — G8190 Hemiplegia, unspecified affecting unspecified side: Secondary | ICD-10-CM | POA: Diagnosis not present

## 2022-04-10 DIAGNOSIS — Z86718 Personal history of other venous thrombosis and embolism: Secondary | ICD-10-CM | POA: Diagnosis not present

## 2022-04-10 DIAGNOSIS — G20 Parkinson's disease: Secondary | ICD-10-CM | POA: Diagnosis not present

## 2022-04-10 DIAGNOSIS — I10 Essential (primary) hypertension: Secondary | ICD-10-CM | POA: Diagnosis not present

## 2022-04-17 ENCOUNTER — Ambulatory Visit: Payer: Medicare Other | Admitting: Physical Medicine & Rehabilitation

## 2022-04-22 DIAGNOSIS — K219 Gastro-esophageal reflux disease without esophagitis: Secondary | ICD-10-CM | POA: Diagnosis not present

## 2022-04-22 DIAGNOSIS — I639 Cerebral infarction, unspecified: Secondary | ICD-10-CM | POA: Diagnosis not present

## 2022-04-22 DIAGNOSIS — G8194 Hemiplegia, unspecified affecting left nondominant side: Secondary | ICD-10-CM | POA: Diagnosis not present

## 2022-04-22 DIAGNOSIS — N312 Flaccid neuropathic bladder, not elsewhere classified: Secondary | ICD-10-CM | POA: Diagnosis not present

## 2022-04-22 DIAGNOSIS — G20 Parkinson's disease: Secondary | ICD-10-CM | POA: Diagnosis not present

## 2022-05-04 IMAGING — CT CT ANGIO HEAD-NECK (W OR W/O PERF)
1 of 8 series · 14 of 37 positions shown · IV contrast (OMNI)
Comparison: None.

CLINICAL DATA: Right-sided weakness.  Same day carotid surgery.

EXAM:
CT ANGIOGRAPHY HEAD AND NECK
TECHNIQUE: Multidetector CT imaging of the head and neck was performed using
the standard protocol during bolus administration of intravenous
contrast. Multiplanar CT image reconstructions and MIPs were
obtained to evaluate the vascular anatomy. Carotid stenosis
measurements (when applicable) are obtained utilizing NASCET
criteria, using the distal internal carotid diameter as the
denominator.
CONTRAST:  75mL OMNIPAQUE IOHEXOL 350 MG/ML SOLN

[Series 6: thin · axial · 0.58mm/px · z∈[+897,+1191]mm · 14 of 680 slices shown]
[im 46/680  brain]
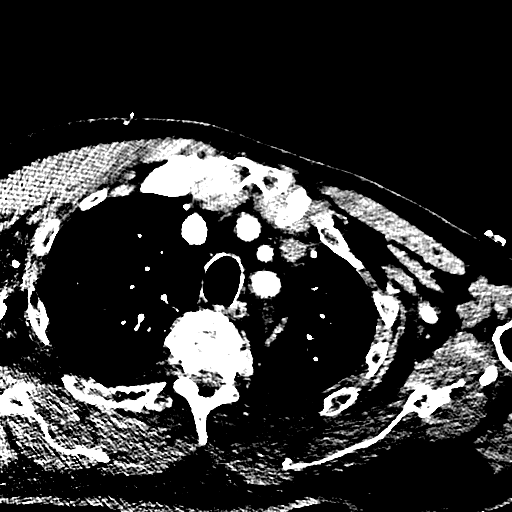
[im 91/680  bone]
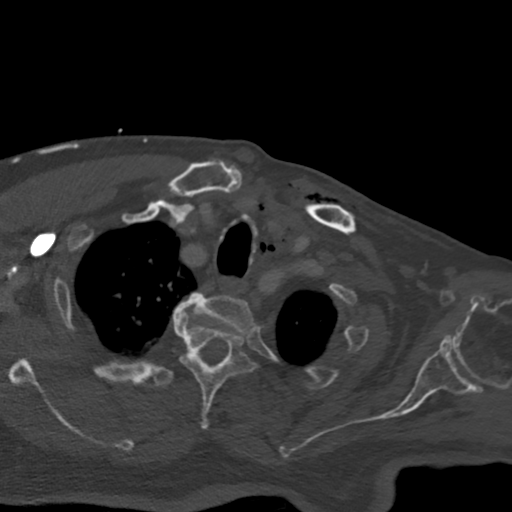
[im 136/680  brain]
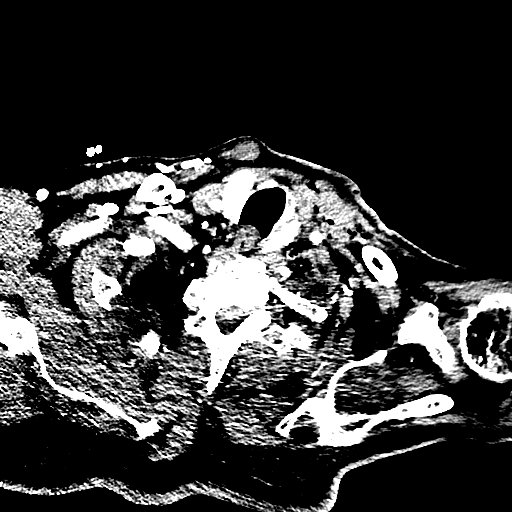
[im 182/680  bone]
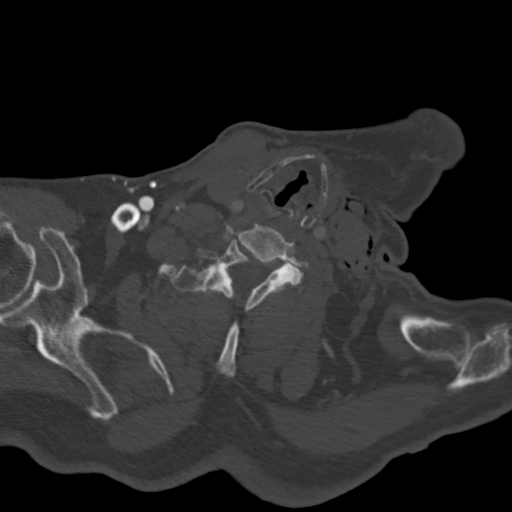
[im 227/680  brain]
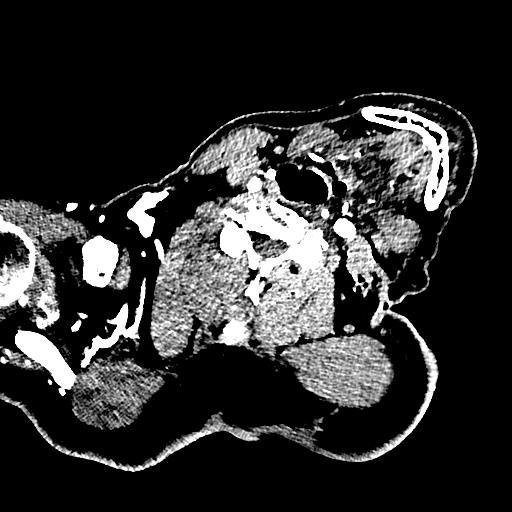
[im 272/680  bone]
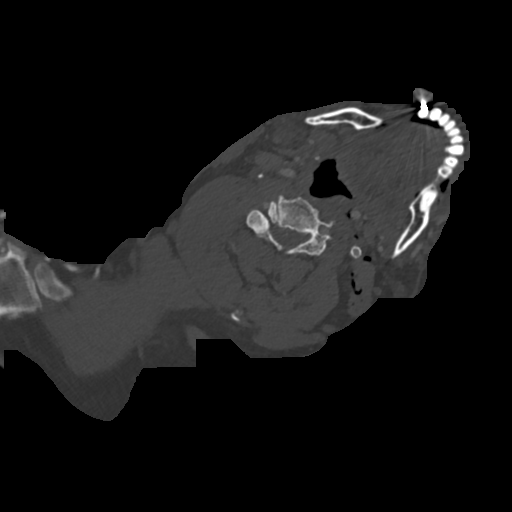
[im 317/680  brain]
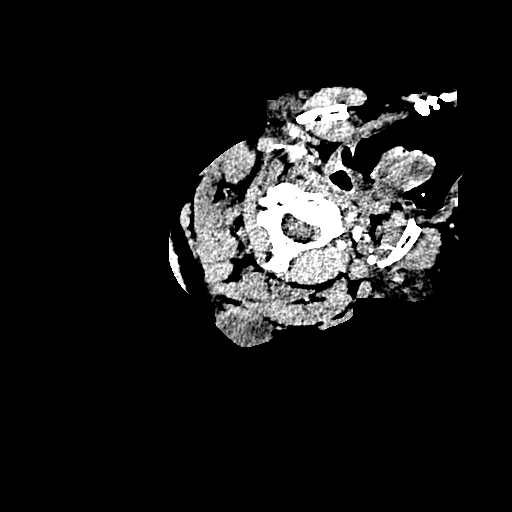
[im 363/680  bone]
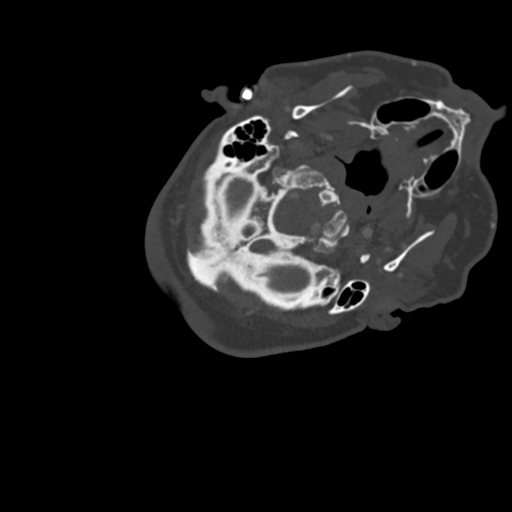
[im 408/680  brain]
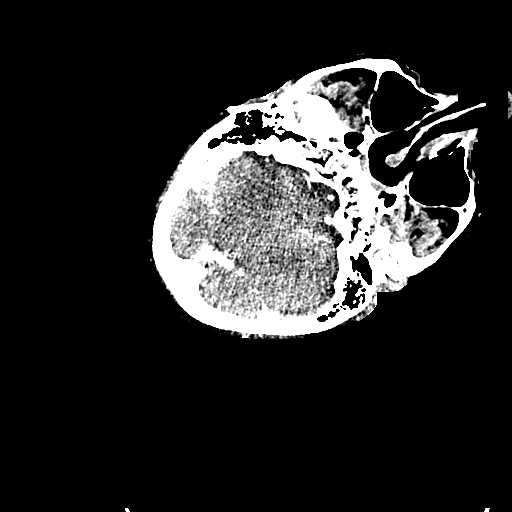
[im 453/680  bone]
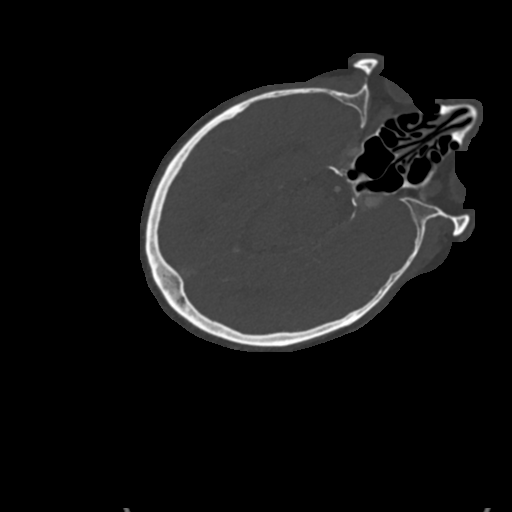
[im 498/680  brain]
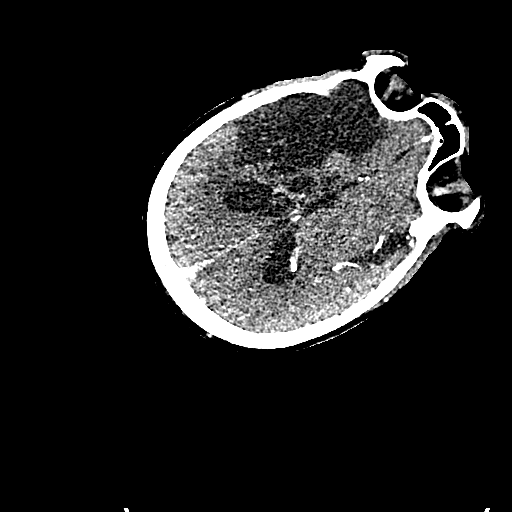
[im 544/680  bone]
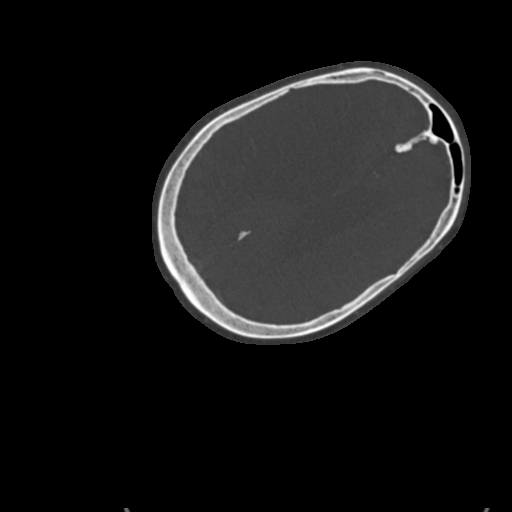
[im 589/680  brain]
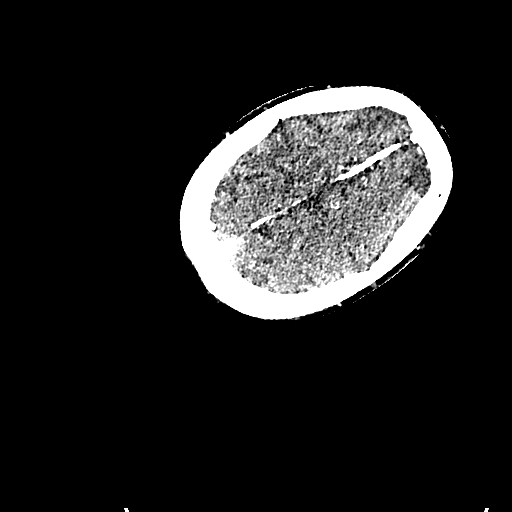
[im 634/680  bone]
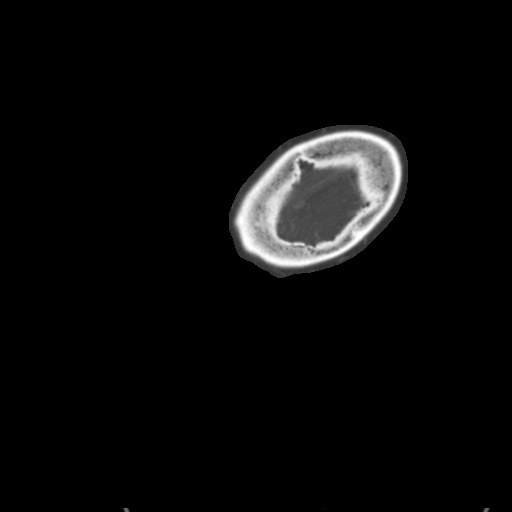

[14 of 37 positions shown; findings below may reference images not displayed]

FINDINGS: CTA NECK FINDINGS

SKELETON: There is no bony spinal canal stenosis. No lytic or
blastic lesion.

OTHER NECK: There is gas tracking superiorly along the course of the
left sternocleidomastoid muscle. Question recent central venous
catheter placement attempt.

UPPER CHEST: No pneumothorax or pleural effusion. No nodules or
masses.

AORTIC ARCH:

There is no calcific atherosclerosis of the aortic arch. There is no
aneurysm, dissection or hemodynamically significant stenosis of the
visualized portion of the aorta. Conventional 3 vessel aortic
branching pattern. The visualized proximal subclavian arteries are
widely patent.

RIGHT CAROTID SYSTEM: Right internal carotid artery is occluded at
its origin. The right external carotid arteries normal.

LEFT CAROTID SYSTEM: Patent left ICA stent

VERTEBRAL ARTERIES: Left dominant configuration. Both origins are
clearly patent. There is no dissection, occlusion or flow-limiting
stenosis to the skull base (V1-V3 segments).

CTA HEAD FINDINGS

POSTERIOR CIRCULATION:

--Vertebral arteries: Normal V4 segments.

--Inferior cerebellar arteries: Normal.

--Basilar artery: Normal.

--Superior cerebellar arteries: Normal.

--Posterior cerebral arteries (PCA): Normal.

ANTERIOR CIRCULATION:

--Intracranial internal carotid arteries: Reconstitution of the
right ICA at the skull base due to collateral flow. Left ICA is
patent.

--Anterior cerebral arteries (ACA): Normal. Both A1 segments are
present. Patent anterior communicating artery (a-comm).

--Middle cerebral arteries (MCA): There is suspected occlusion of a
left MCA M2 branch (series 8, image 147) at a branch point in the
sylvian fissure. There is also high-grade stenosis at the origin of
the left M2 branch just beyond the bifurcation. There is pruning of
the right MCA branches in the context of old infarct.

VENOUS SINUSES: As permitted by contrast timing, patent.

ANATOMIC VARIANTS: None

Review of the MIP images confirms the above findings.
IMPRESSION: 1. Suspected occlusion of a left MCA M2 branch at a branch point in
the Sylvian fissure. These results were called by telephone at the
time of interpretation on 05/12/2021 at [DATE] to provider DAYSI
ROBERTO, who verbally acknowledged these results.
2. Occlusion of the right internal carotid artery at its origin.
3. Patent left ICA stent.
4. Gas tracking superiorly along the course of the left
sternocleidomastoid muscle, possibly recent central venous catheter
placement attempt.

## 2022-05-21 ENCOUNTER — Encounter: Payer: Medicare Other | Admitting: Physical Medicine & Rehabilitation

## 2022-05-25 DIAGNOSIS — I639 Cerebral infarction, unspecified: Secondary | ICD-10-CM | POA: Diagnosis not present

## 2022-05-25 DIAGNOSIS — K219 Gastro-esophageal reflux disease without esophagitis: Secondary | ICD-10-CM | POA: Diagnosis not present

## 2022-05-25 DIAGNOSIS — G20 Parkinson's disease: Secondary | ICD-10-CM | POA: Diagnosis not present

## 2022-05-25 DIAGNOSIS — N312 Flaccid neuropathic bladder, not elsewhere classified: Secondary | ICD-10-CM | POA: Diagnosis not present

## 2022-05-25 DIAGNOSIS — G8194 Hemiplegia, unspecified affecting left nondominant side: Secondary | ICD-10-CM | POA: Diagnosis not present

## 2022-06-17 DIAGNOSIS — G8194 Hemiplegia, unspecified affecting left nondominant side: Secondary | ICD-10-CM | POA: Diagnosis not present

## 2022-06-17 DIAGNOSIS — K219 Gastro-esophageal reflux disease without esophagitis: Secondary | ICD-10-CM | POA: Diagnosis not present

## 2022-06-17 DIAGNOSIS — I639 Cerebral infarction, unspecified: Secondary | ICD-10-CM | POA: Diagnosis not present

## 2022-06-17 DIAGNOSIS — G20 Parkinson's disease: Secondary | ICD-10-CM | POA: Diagnosis not present

## 2022-06-17 DIAGNOSIS — N312 Flaccid neuropathic bladder, not elsewhere classified: Secondary | ICD-10-CM | POA: Diagnosis not present

## 2022-07-22 DIAGNOSIS — I639 Cerebral infarction, unspecified: Secondary | ICD-10-CM | POA: Diagnosis not present

## 2022-07-22 DIAGNOSIS — K219 Gastro-esophageal reflux disease without esophagitis: Secondary | ICD-10-CM | POA: Diagnosis not present

## 2022-07-22 DIAGNOSIS — G20 Parkinson's disease: Secondary | ICD-10-CM | POA: Diagnosis not present

## 2022-07-22 DIAGNOSIS — G8194 Hemiplegia, unspecified affecting left nondominant side: Secondary | ICD-10-CM | POA: Diagnosis not present

## 2022-07-22 DIAGNOSIS — N312 Flaccid neuropathic bladder, not elsewhere classified: Secondary | ICD-10-CM | POA: Diagnosis not present

## 2022-09-26 DIAGNOSIS — R0689 Other abnormalities of breathing: Secondary | ICD-10-CM | POA: Diagnosis not present

## 2022-09-26 DIAGNOSIS — R0789 Other chest pain: Secondary | ICD-10-CM | POA: Diagnosis not present

## 2022-09-26 DIAGNOSIS — R079 Chest pain, unspecified: Secondary | ICD-10-CM | POA: Diagnosis not present

## 2022-09-26 DIAGNOSIS — I959 Hypotension, unspecified: Secondary | ICD-10-CM | POA: Diagnosis not present

## 2022-11-14 ENCOUNTER — Emergency Department (HOSPITAL_COMMUNITY)
Admission: EM | Admit: 2022-11-14 | Discharge: 2022-11-14 | Disposition: A | Discharge status: Home / Self Care | Payer: Medicare Other | Attending: Emergency Medicine | Admitting: Emergency Medicine

## 2022-11-14 ENCOUNTER — Encounter (HOSPITAL_COMMUNITY): Payer: Self-pay

## 2022-11-14 ENCOUNTER — Other Ambulatory Visit: Payer: Self-pay

## 2022-11-14 ENCOUNTER — Emergency Department (HOSPITAL_COMMUNITY): Payer: Medicare Other

## 2022-11-14 DIAGNOSIS — E119 Type 2 diabetes mellitus without complications: Secondary | ICD-10-CM | POA: Diagnosis not present

## 2022-11-14 DIAGNOSIS — R4182 Altered mental status, unspecified: Secondary | ICD-10-CM | POA: Diagnosis not present

## 2022-11-14 DIAGNOSIS — U071 COVID-19: Secondary | ICD-10-CM | POA: Insufficient documentation

## 2022-11-14 DIAGNOSIS — E871 Hypo-osmolality and hyponatremia: Secondary | ICD-10-CM | POA: Diagnosis not present

## 2022-11-14 DIAGNOSIS — Z7901 Long term (current) use of anticoagulants: Secondary | ICD-10-CM | POA: Insufficient documentation

## 2022-11-14 DIAGNOSIS — Z7984 Long term (current) use of oral hypoglycemic drugs: Secondary | ICD-10-CM | POA: Diagnosis not present

## 2022-11-14 DIAGNOSIS — G8190 Hemiplegia, unspecified affecting unspecified side: Secondary | ICD-10-CM | POA: Diagnosis not present

## 2022-11-14 DIAGNOSIS — Z7982 Long term (current) use of aspirin: Secondary | ICD-10-CM | POA: Insufficient documentation

## 2022-11-14 DIAGNOSIS — I10 Essential (primary) hypertension: Secondary | ICD-10-CM | POA: Diagnosis not present

## 2022-11-14 DIAGNOSIS — R509 Fever, unspecified: Secondary | ICD-10-CM | POA: Diagnosis not present

## 2022-11-14 DIAGNOSIS — Z7401 Bed confinement status: Secondary | ICD-10-CM | POA: Diagnosis not present

## 2022-11-14 LAB — COMPREHENSIVE METABOLIC PANEL
ALT: 18 U/L (ref 0–44)
AST: 18 U/L (ref 15–41)
Albumin: 3.7 g/dL (ref 3.5–5.0)
Alkaline Phosphatase: 58 U/L (ref 38–126)
Anion gap: 3 — ABNORMAL LOW (ref 5–15)
BUN: 11 mg/dL (ref 8–23)
CO2: 27 mmol/L (ref 22–32)
Calcium: 8.7 mg/dL — ABNORMAL LOW (ref 8.9–10.3)
Chloride: 103 mmol/L (ref 98–111)
Creatinine, Ser: 0.95 mg/dL (ref 0.61–1.24)
GFR, Estimated: >60 mL/min (ref >60)
Glucose, Bld: 134 mg/dL — ABNORMAL HIGH (ref 70–99)
Potassium: 3.7 mmol/L (ref 3.5–5.1)
Sodium: 133 mmol/L — ABNORMAL LOW (ref 135–145)
Total Bilirubin: 0.4 mg/dL (ref 0.3–1.2)
Total Protein: 7 g/dL (ref 6.5–8.1)

## 2022-11-14 LAB — URINALYSIS, ROUTINE W REFLEX MICROSCOPIC
Bilirubin Urine: NEGATIVE
Glucose, UA: NEGATIVE mg/dL
Ketones, ur: NEGATIVE mg/dL
Nitrite: NEGATIVE
Protein, ur: 30 mg/dL — AB
RBC / HPF: >50 RBC/hpf — ABNORMAL HIGH (ref 0–5)
Specific Gravity, Urine: 1.011 (ref 1.005–1.030)
WBC, UA: >50 WBC/hpf — ABNORMAL HIGH (ref 0–5)
pH: 5 (ref 5.0–8.0)

## 2022-11-14 LAB — CBC WITH DIFFERENTIAL/PLATELET
Abs Immature Granulocytes: 0.01 10*3/uL (ref 0.00–0.07)
Basophils Absolute: 0 10*3/uL (ref 0.0–0.1)
Basophils Relative: 0 %
Eosinophils Absolute: 0.1 10*3/uL (ref 0.0–0.5)
Eosinophils Relative: 2 %
HCT: 44 % (ref 39.0–52.0)
Hemoglobin: 14.4 g/dL (ref 13.0–17.0)
Immature Granulocytes: 0 %
Lymphocytes Relative: 9 %
Lymphs Abs: 0.5 10*3/uL — ABNORMAL LOW (ref 0.7–4.0)
MCH: 29.6 pg (ref 26.0–34.0)
MCHC: 32.7 g/dL (ref 30.0–36.0)
MCV: 90.3 fL (ref 80.0–100.0)
Monocytes Absolute: 1.4 10*3/uL — ABNORMAL HIGH (ref 0.1–1.0)
Monocytes Relative: 24 %
Neutro Abs: 3.7 10*3/uL (ref 1.7–7.7)
Neutrophils Relative %: 65 %
Platelets: 209 10*3/uL (ref 150–400)
RBC: 4.87 MIL/uL (ref 4.22–5.81)
RDW: 14.2 % (ref 11.5–15.5)
WBC: 5.7 10*3/uL (ref 4.0–10.5)
nRBC: 0 % (ref 0.0–0.2)

## 2022-11-14 LAB — RESP PANEL BY RT-PCR (RSV, FLU A&B, COVID)  RVPGX2
Influenza A by PCR: NEGATIVE
Influenza B by PCR: NEGATIVE
Resp Syncytial Virus by PCR: NEGATIVE
SARS Coronavirus 2 by RT PCR: POSITIVE — AB

## 2022-11-14 LAB — PROTIME-INR
INR: 1.5 — ABNORMAL HIGH (ref 0.8–1.2)
Prothrombin Time: 17.5 seconds — ABNORMAL HIGH (ref 11.4–15.2)

## 2022-11-14 LAB — APTT: aPTT: 33 seconds (ref 24–36)

## 2022-11-14 LAB — LACTIC ACID, PLASMA: Lactic Acid, Venous: 1.3 mmol/L (ref 0.5–1.9)

## 2022-11-14 MED ORDER — ACETAMINOPHEN 500 MG PO TABS
1000.0000 mg | ORAL_TABLET | Freq: Once | ORAL | Status: AC
  Administered 2022-11-14: 1000 mg via ORAL
  Filled 2022-11-14: qty 2

## 2022-11-14 MED ORDER — MOLNUPIRAVIR EUA 200MG CAPSULE: 4.0000 | ORAL_CAPSULE | Freq: Two times a day (BID) | ORAL | 0 refills | Status: AC

## 2022-11-14 MED ORDER — MOLNUPIRAVIR EUA 200MG CAPSULE: 4.0000 | ORAL_CAPSULE | Freq: Two times a day (BID) | ORAL | 0 refills | Status: DC

## 2022-11-14 MED ORDER — ONDANSETRON 4 MG PO TBDP: 4.0000 mg | ORAL_TABLET | Freq: Three times a day (TID) | ORAL | 0 refills | Status: DC | PRN

## 2022-11-14 NOTE — ED Notes (Signed)
Spoke with [facility] and scheduled transportation

## 2022-11-14 NOTE — Discharge Instructions (Addendum)
Please read and follow all provided instructions.  Your diagnoses today include:  1. COVID-19   2. Fever, unspecified fever cause     Tests performed today include: Vital signs. See below for your results today.  COVID test - pending, check mychart for results Complete blood cell count: normal Complete metabolic panel: no problems Urinalysis (urine test): Shows red and white blood cells, unclear if this indicates an infection, culture pending Pregnancy test (urine or blood, in women only):  Medications prescribed:  Mulnupiravir: medication for COVID  Zofran (ondansetron) - for nausea and vomiting  Take any prescribed medications only as directed. Treatment for your infection is aimed at treating the symptoms. There are no medications, such as antibiotics, that will cure your infection.   Home care instructions:  Follow any educational materials contained in this packet.   Your illness is contagious and can be spread to others, especially during the first 3 or 4 days. It cannot be cured by antibiotics or other medicines. Take basic precautions such as washing your hands often, covering your mouth when you cough or sneeze, and avoiding public places where you could spread your illness to others.   Please continue drinking plenty of fluids.  Use over-the-counter medicines as needed as directed on packaging for symptom relief.  You may also use ibuprofen or tylenol as directed on packaging for pain or fever.  Do not take multiple medicines containing Tylenol or acetaminophen to avoid taking too much of this medication.  If you are positive for Covid-19, you should isolate yourself and not be exposed to other people for 5 days after your symptoms began. If you are not feeling better at day 5, you need to isolate yourself for a total of 10 days. If you are feeling better by day 5, you should wear a mask properly, over your nose and mouth, at all times while around other people until 10 days  after your symptoms started.   Follow-up instructions: Please follow-up with your primary care provider as needed for further evaluation of your symptoms if you are not feeling better.   Return instructions:  Please return to the Emergency Department if you experience worsening symptoms.  Return to the emergency department if you have worsening shortness of breath breathing or increased work of breathing, persistent vomiting RETURN IMMEDIATELY IF you develop shortness of breath, confusion or altered mental status, a new rash, become dizzy, faint, or poorly responsive, or are unable to be cared for at home. Please return if you have persistent vomiting and cannot keep down fluids or develop a fever that is not controlled by tylenol or motrin.   Please return if you have any other emergent concerns.  Additional Information:  Your vital signs today were: BP (!) 179/80   Pulse (!) 103   Temp 99.8 F (37.7 C) (Oral)   Resp (!) 26   Wt 95.3 kg   SpO2 100%   BMI 26.96 kg/m  If your blood pressure (BP) was elevated above 135/85 this visit, please have this repeated by your doctor within one month. --------------

## 2022-11-14 NOTE — ED Triage Notes (Signed)
BIBA from home for fever x3 days with t.max 103.  Tylenol last @ 0100 Pt on preventive abt for UTI Foley in place on arrival Ems [id number]

## 2022-11-14 NOTE — ED Provider Notes (Signed)
[facility] Provider Note   CSN: [id number] Arrival date & time: 11/14/22  8469     History  Chief Complaint  Patient presents with   Fever    Robert [name] is a 77 y.o. male.  Patient with history of stroke with L sided weakness, chronic indwelling Foley catheter, on prophylactic Keflex, history of diabetes --presents to the emergency department today by EMS for evaluation of fever.  Patient has no complaints.  Per their report patient has been taking Tylenol and has had fever for 3 days.  Per family history, patient developed fever yesterday with shaking chills at home.  This was treated with Tylenol, however fever did not stay down.  No other focal symptoms.  Patient is due to have catheter exchanged by home health nurse later this week.  He has it changed every 3 weeks.  No UTIs since August when he started prophylactic antibiotics.       Home Medications Prior to Admission medications   Medication Sig Start Date End Date Taking? Authorizing Provider  Acidophilus Lactobacillus CAPS See admin instructions.    [provider]  ACIDOPHILUS LACTOBACILLUS PO Take 1 tablet by mouth at bedtime. 02/03/22   [provider]  apixaban (ELIQUIS) 5 MG TABS tablet Take 1 tablet (5 mg total) by mouth 2 (two) times daily. 05/27/21   Love, Ivan Anchors, PA-C  ascorbic acid (VITAMIN C) 500 MG tablet TAKE ONE TABLET BY MOUTH DAILY FOR PREVENTION OF UTI 02/17/22   [provider]  aspirin EC 81 MG tablet Take 81 mg by mouth daily. Swallow whole.    [provider]  benzonatate (TESSALON) 100 MG capsule Take 100 mg by mouth 3 (three) times daily as needed for cough. 10/01/21   [provider]  Blood Glucose Monitoring Suppl ([place]) w/Device KIT check blood sugar 11/25/20   [provider]  Carbidopa-Levodopa ER (SINEMET CR) 25-100 MG tablet controlled release Take 2 1/2 tablets by mouth  twice a day and take 1 tablet once a day with supper - taken at 0900, 1300, 1700 12/18/21   [provider]  cephALEXin (KEFLEX) 500 MG capsule TAKE ONE CAPSULE BY MOUTH TWICE A DAY FOR UTI 03/13/22   [provider]  Cholecalciferol (VITAMIN D3) 50 MCG (2000 UT) TABS 1 tablet    [provider]  Cholecalciferol 50 MCG (2000 UT) TABS Take 2,000 Units by mouth daily. 12/23/21   [provider]  feeding supplement (ENSURE ENLIVE / ENSURE PLUS) LIQD Take 237 mLs by mouth 2 (two) times daily between meals. 02/11/22   Lavina Hamman, MD  finasteride (PROSCAR) 5 MG tablet Take 5 mg by mouth at bedtime. 01/07/22   [provider]  Fluocinolone Acetonide Body 0.01 % OIL Apply 1 application. topically See admin instructions. Apply thin film to the scalp 1-2 times daily as needed for itching 11/06/21   [provider]  hydrOXYzine (ATARAX) 10 MG tablet Take 10 mg by mouth 3 (three) times daily as needed.    [provider]  memantine (NAMENDA) 10 MG tablet Take 20 mg by mouth at bedtime.    [provider]  metFORMIN (GLUCOPHAGE) 500 MG tablet Take 500 mg by mouth at bedtime.    [provider]  methenamine (HIPREX) 1 g tablet Take 1 g by mouth 2 (two) times daily. for infection prevention 02/03/22   [provider]  methenamine (HIPREX) 1 g tablet 1  tablet    [provider]  nystatin-triamcinolone ointment (MYCOLOG) Apply 1 application. topically See admin instructions. Apply thin film to groin and leg twice a day 10/26/21   [provider]  omeprazole (PRILOSEC) 20 MG capsule Take 20 mg by mouth daily.    [provider]  ondansetron (ZOFRAN) 8 MG tablet Take 4 mg by mouth daily as needed for nausea. 09/23/21   [provider]  Pimavanserin Tartrate (NUPLAZID) 34 MG CAPS Take 34 mg by mouth daily.    [provider]  polyethylene glycol (MIRALAX / GLYCOLAX) 17 g packet Take 17 g by  mouth daily as needed for mild constipation. 05/12/17   [provider]  rasagiline (AZILECT) 1 MG TABS tablet Take 1 mg by mouth daily.    [provider]  simvastatin (ZOCOR) 40 MG tablet Take 40 mg by mouth at bedtime. 09/05/15   [provider]  tacrolimus (PROTOPIC) 0.1 % ointment Apply 1 application. topically See admin instructions. Apply to groin, armpit, and bottom when skin is broken out twice a day as needed 11/06/21   [provider]      Allergies    Fluconazole, Propofol, Lisinopril, and Sulfamethoxazole-trimethoprim    Review of Systems   Review of Systems  Physical Exam Updated Vital Signs BP (!) 143/84 (BP Location: Right Arm)   Pulse 94   Temp (!) 100.9 F (38.3 C) (Rectal)   Resp 17   Wt 95.3 kg   SpO2 98%   BMI 26.96 kg/m   Physical Exam Vitals and nursing note reviewed.  Constitutional:      General: He is not in acute distress.    Appearance: He is well-developed.  HENT:     Head: Normocephalic and atraumatic.     Right Ear: External ear normal.     Left Ear: External ear normal.     Nose: Nose normal.     Mouth/Throat:     Mouth: Mucous membranes are moist.  Eyes:     General:        Right eye: No discharge.        Left eye: No discharge.     Conjunctiva/sclera: Conjunctivae normal.  Cardiovascular:     Rate and Rhythm: Normal rate and regular rhythm.     Heart sounds: Normal heart sounds.  Pulmonary:     Effort: Pulmonary effort is normal.     Breath sounds: Normal breath sounds.  Abdominal:     Palpations: Abdomen is soft.     Tenderness: There is no abdominal tenderness. There is no guarding or rebound.  Genitourinary:    Comments: Yellow urine in foley bag Musculoskeletal:     Cervical back: Normal range of motion and neck supple.  Skin:    General: Skin is warm and dry.  Neurological:     Mental Status: He is alert.     ED Results / Procedures / Treatments   Labs (all labs ordered are listed,  but only abnormal results are displayed) Labs Reviewed  CBC WITH DIFFERENTIAL/PLATELET - Abnormal; Notable for the following components:      Result Value   Lymphs Abs 0.5 (*)    Monocytes Absolute 1.4 (*)    All other components within normal limits  PROTIME-INR - Abnormal; Notable for the following components:   Prothrombin Time 17.5 (*)    INR 1.5 (*)    All other components within normal limits  CULTURE, BLOOD (ROUTINE X 2)  CULTURE, BLOOD (ROUTINE  X 2)  URINE CULTURE  RESP PANEL BY RT-PCR (RSV, FLU A&B, COVID)  [id number]  APTT  LACTIC ACID, PLASMA  LACTIC ACID, PLASMA  COMPREHENSIVE METABOLIC PANEL  URINALYSIS, ROUTINE W REFLEX MICROSCOPIC    ED ECG REPORT   Date: 11/14/2022  Rate: 95  Rhythm: normal sinus rhythm  QRS Axis: normal  Intervals: normal  ST/T Wave abnormalities: normal  Conduction Disutrbances:none  Narrative Interpretation:   Old EKG Reviewed: changes noted, except faster  I have personally reviewed the EKG tracing and agree with the computerized printout as noted.  Radiology DG Chest 2 View  Result Date: 11/14/2022 CLINICAL DATA:  Evaluate for abnormality. Questionable sepsis. Fever for 3 days. EXAM: CHEST - 2 VIEW COMPARISON:  02/08/2022 FINDINGS: Heart size and mediastinal contours are unremarkable. No pleural effusion or edema. No airspace opacities identified. The visualized osseous structures appear grossly intact. IMPRESSION: No active cardiopulmonary abnormalities. Electronically Signed   By: Kerby Moors M.D.   On: 11/14/2022 07:33    Procedures Procedures    Medications Ordered in ED Medications - No data to display  ED Course/ Medical Decision Making/ A&P    Patient seen and examined. History obtained directly from patient. Also reviewed recent vascular surgery note.   Labs/EKG: Sepsis order set initiated  Imaging: Ordered chest x-ray  Medications/Fluids: Ordered: Oral Tylenol.   Most recent vital signs reviewed and are as  follows: BP (!) 152/75   Pulse 91   Temp (!) 100.9 F (38.3 C) (Rectal)   Resp 20   Wt 95.3 kg   SpO2 96%   BMI 26.96 kg/m   Initial impression: Fever of unknown source  11:54 AM Reassessment performed. Patient appears appears stable during multiple reassessments.  Labs personally reviewed and interpreted including: CBC with normal white blood cell count, normal hemoglobin; CMP slightly low sodium at 133, normal kidney function, glucose 134; lactate 1.3; UA shows red cells and white cells in setting of recent catheter exchange, unclear UTI, culture pending; PT/INR and APTT as part of sepsis evaluation and protocol, INR elevated in setting of DOAC use.  Imaging personally visualized and interpreted including: Chest x-ray, agree negative.  Reviewed pertinent lab work and imaging with patient at bedside. Questions answered.   Most current vital signs reviewed and are as follows: BP (!) 179/80   Pulse (!) 103   Temp 99.8 F (37.7 C) (Oral)   Resp (!) 26   Wt 95.3 kg   SpO2 100%   BMI 26.96 kg/m   Plan: Discharge to home.   Prescriptions written for: Molnupiravir, considered Paxlovid however will need to avoid given concurrent anticoagulation and simvastatin use.  Other home care instructions discussed: Detailed discussion had with with patient regarding COVID-19 precautions and written instructions given as well.  We discussed need to isolate themselves for 5 days from onset of symptoms and have 24 hours of improvement prior to breaking isolation.  We discussed that when breaking isolation, mask wearing for 5 additional days is required.  We discussed signs symptoms to return which include worsening shortness of breath, trouble breathing, or increased work of breathing.  Also return with persistent vomiting, confusion, passing out, or if they have any other concerns. Counseled on the need for rest and good hydration. Discussed that high-risk contacts should be aware of positive result  and they need to quarantine and be tested if they develop any symptoms. Patient verbalizes understanding.   Robert [name] was evaluated in Emergency Department on 11/14/2022 for the symptoms  described in the history of present illness. He was evaluated in the context of the global COVID-19 pandemic, which necessitated consideration that the patient might be at risk for infection with the SARS-CoV-2 virus that causes COVID-19. Institutional protocols and algorithms that pertain to the evaluation of patients at risk for COVID-19 are in a state of rapid change based on information released by regulatory bodies including the CDC and federal and state organizations. These policies and algorithms were followed during the patient's care in the ED.                            Medical Decision Making Amount and/or Complexity of Data Reviewed Labs: ordered. Radiology: ordered. ECG/medicine tests: ordered.  Risk OTC drugs. Prescription drug management.   Patient presents to the emergency department today for fever.  He is otherwise relatively asymptomatic.  No vomiting or diarrhea.  Lab workup overall reassuring, however he was found to have COVID.  UTI has been an ongoing problem for the patient and he is currently on prophylactic antibiotics.  At this point, I have low concern for urosepsis in setting of COVID which is the most likely etiology.  Urine appears clear at bedside.  UA is difficult to interpret in setting of chronic catheter use and catheter exchange.  For this reason, urine culture was sent and is pending.  Vital signs are stable.  He looks well, nontoxic.  Will initiate COVID treatment and have patient monitor closely at home.  The patient's vital signs, pertinent lab work and imaging were reviewed and interpreted as discussed in the ED course. Hospitalization was considered for further testing, treatments, or serial exams/observation. However as patient is well-appearing, has a stable exam,  and reassuring studies today, I do not feel that they warrant admission at this time. This plan was discussed with the patient who verbalizes agreement and comfort with this plan and seems reliable and able to return to the Emergency Department with worsening or changing symptoms.          Final Clinical Impression(s) / ED Diagnoses Final diagnoses:  COVID-19  Fever, unspecified fever cause    Rx / DC Orders ED Discharge Orders          Ordered    molnupiravir EUA (LAGEVRIO) 200 mg CAPS capsule  2 times daily        11/14/22 1145    ondansetron (ZOFRAN-ODT) 4 MG disintegrating tablet  Every 8 hours PRN        11/14/22 1146              Carlisle Cater, PA-C 11/14/22 1157    Cardama, Grayce Sessions, MD 11/20/22 [phone]

## 2022-11-16 LAB — URINE CULTURE: Culture: 30000 — AB

## 2022-11-17 ENCOUNTER — Telehealth (HOSPITAL_BASED_OUTPATIENT_CLINIC_OR_DEPARTMENT_OTHER): Payer: Self-pay | Admitting: Emergency Medicine

## 2022-11-17 NOTE — Telephone Encounter (Signed)
Post ED Visit - Positive Culture Follow-up: Successful Patient Follow-Up  Culture assessed and recommendations reviewed by:  '[]'$  Elenor Quinones, Pharm.D. '[]'$  Heide Guile, Pharm.D., BCPS AQ-ID '[]'$  Parks Neptune, Pharm.D., BCPS '[]'$  Alycia Rossetti, Pharm.D., BCPS '[]'$  [place], [place].D., BCPS, AAHIVP '[]'$  Legrand Como, Pharm.D., BCPS, AAHIVP '[]'$  Salome Arnt, PharmD, BCPS '[]'$  Johnnette Gourd, PharmD, BCPS '[]'$  Hughes Better, PharmD, BCPS '[]'$  Leeroy Cha, PharmD Reuel Boom PharmD  Positive urine culture  '[]'$  Patient discharged without antimicrobial prescription and treatment is now indicated '[]'$  Organism is resistant to prescribed ED discharge antimicrobial '[]'$  Patient with positive blood cultures  Changes discussed with ED provider: Lance Muss New antibiotic prescription: symptom check, if still with fevers and suprapubic pain, start cipro '500mg'$  po bid , wife states patient painfree and fever around 99, no new treatment given  Contacted patient, date 11/17/2022, time [place] 11/17/2022, 1:04 PM

## 2022-11-17 NOTE — Progress Notes (Signed)
ED Antimicrobial Stewardship Positive Culture Follow Up   Robert [name] is an 77 y.o. male who presented to [facility] on 11/14/2022 with a chief complaint of  Chief Complaint  Patient presents with   Fever    Recent Results (from the past 720 hour(s))  Blood Culture (routine x 2)     Status: None (Preliminary result)   Collection Time: 11/14/22  6:58 AM   Specimen: BLOOD RIGHT HAND  Result Value Ref Range Status   Specimen Description   Final    BLOOD RIGHT HAND BOTTLES DRAWN AEROBIC AND ANAEROBIC Performed at [facility] [address], [place], [place] [address]    Special Requests   Final    Blood Culture results may not be optimal due to an inadequate volume of blood received in culture bottles Performed at [facility] [address], [place], [place] [address]    Culture   Final    NO GROWTH 3 DAYS Performed at [facility], [place] [address], [place], [place] [address]    Report Status PENDING  Incomplete  Blood Culture (routine x 2)     Status: None (Preliminary result)   Collection Time: 11/14/22  7:30 AM   Specimen: BLOOD LEFT HAND  Result Value Ref Range Status   Specimen Description   Final    BLOOD LEFT HAND BOTTLES DRAWN AEROBIC AND ANAEROBIC Performed at [facility] [address], [place], [place] [address]    Special Requests   Final    Blood Culture adequate volume Performed at [facility] [address], [place], [place] [address]    Culture   Final    NO GROWTH 3 DAYS Performed at [facility], [place] [address], [place], [place] [address]    Report Status PENDING  Incomplete  Resp panel by RT-PCR (RSV, Flu A&B, Covid) Anterior Nasal Swab     Status: Abnormal   Collection Time: 11/14/22  7:31 AM   Specimen: Anterior Nasal Swab  Result Value Ref Range Status   SARS Coronavirus 2 by RT PCR POSITIVE (A) NEGATIVE Final    Comment: (NOTE) SARS-CoV-2 target nucleic  acids are DETECTED.  The SARS-CoV-2 RNA is generally detectable in upper respiratory specimens during the acute phase of infection. Positive results are indicative of the presence of the identified virus, but do not rule out bacterial infection or co-infection with other pathogens not detected by the test. Clinical correlation with patient history and other diagnostic information is necessary to determine patient infection status. The expected result is Negative.  Fact Sheet for Patients: EntrepreneurPulse.com.au  Fact Sheet for Healthcare Providers: IncredibleEmployment.be  This test is not yet approved or cleared by the [place] FDA and  has been authorized for detection and/or diagnosis of SARS-CoV-2 by FDA under an Emergency Use Authorization (EUA).  This EUA will remain in effect (meaning this test can be used) for the duration of  the COVID-19 declaration under Section 564(b)(1) of the A ct, 21 U.S.C. section 360bbb-3(b)(1), unless the authorization is terminated or revoked sooner.     Influenza A by PCR NEGATIVE NEGATIVE Final   Influenza B by PCR NEGATIVE NEGATIVE Final    Comment: (NOTE) The Xpert Xpress SARS-CoV-2/FLU/RSV plus assay is intended as an aid in the diagnosis of influenza from Nasopharyngeal swab specimens and should not be used as a sole basis for treatment. Nasal washings and aspirates are unacceptable for Xpert Xpress SARS-CoV-2/FLU/RSV testing.  Fact Sheet for Patients: EntrepreneurPulse.com.au  Fact Sheet for Healthcare Providers: IncredibleEmployment.be  This test is not yet approved or cleared by the [place] FDA and has been authorized for detection and/or diagnosis of SARS-CoV-2 by FDA under an Emergency Use Authorization (EUA). This EUA will remain in effect (meaning this test can be used) for the duration of the COVID-19 declaration under Section 564(b)(1) of  the Act, 21 U.S.C. section 360bbb-3(b)(1), unless the authorization is terminated or revoked.     Resp Syncytial Virus by PCR NEGATIVE NEGATIVE Final    Comment: (NOTE) Fact Sheet for Patients: EntrepreneurPulse.com.au  Fact Sheet for Healthcare Providers: IncredibleEmployment.be  This test is not yet approved or cleared by the [place] FDA and has been authorized for detection and/or diagnosis of SARS-CoV-2 by FDA under an Emergency Use Authorization (EUA). This EUA will remain in effect (meaning this test can be used) for the duration of the COVID-19 declaration under Section 564(b)(1) of the Act, 21 U.S.C. section 360bbb-3(b)(1), unless the authorization is terminated or revoked.  Performed at [facility], [place] [address] [place], [place] [address]   Urine Culture     Status: Abnormal   Collection Time: 11/14/22  9:42 AM   Specimen: In/Out Cath Urine  Result Value Ref Range Status   Specimen Description   Final    IN/OUT CATH URINE Performed at [facility] [address], [place], [place] [address]    Special Requests   Final    NONE Performed at [facility], [place] [address], [place], [place] [address]    Culture 30,000 COLONIES/mL PSEUDOMONAS AERUGINOSA (A)  Final   Report Status 11/16/2022 FINAL  Final   Organism ID, Bacteria PSEUDOMONAS AERUGINOSA (A)  Final      Susceptibility   Pseudomonas aeruginosa - MIC*    CEFTAZIDIME <=1 SENSITIVE Sensitive     CIPROFLOXACIN 0.5 SENSITIVE Sensitive     GENTAMICIN <=1 SENSITIVE Sensitive     IMIPENEM 2 SENSITIVE Sensitive     PIP/TAZO <=4 SENSITIVE Sensitive     CEFEPIME 0.5 SENSITIVE Sensitive     * 30,000 COLONIES/mL PSEUDOMONAS AERUGINOSA   Presented with fevers x 3 days; chronic indwelling Foley; no UTIs since starting Keflex prophylaxis last fall. Found to be COVID positive; low suspicion for UTI at the time but  difficult to r/o entirely, so UA sent for culture.  Plan: call patient for symptom check If new suprapubic pain or persistent fevers (in ABSENCE of continued/worsening COVID symptoms), start Ciprofloxacin 500 mg PO BID x 7 days (#14) If persistent COVID symptoms or resolution of COVID, no further action needed  ED Provider: Lavenia Atlas, MD   Einar Nolasco A 11/17/2022, 11:12 AM Clinical Pharmacist [phone]

## 2022-11-19 LAB — CULTURE, BLOOD (ROUTINE X 2)
Culture: NO GROWTH
Culture: NO GROWTH
Special Requests: ADEQUATE

## 2023-03-08 ENCOUNTER — Other Ambulatory Visit: Payer: Self-pay | Admitting: *Deleted

## 2023-03-08 DIAGNOSIS — I6522 Occlusion and stenosis of left carotid artery: Secondary | ICD-10-CM

## 2023-03-15 NOTE — Progress Notes (Unsigned)
Patient name: Robert [name] MRN: [id number] DOB: 18-Jul-1946 Sex: male  REASON FOR VISIT: 1 year follow-up for interval carotid surveillance after previous left TCAR  HPI: Robert [name] is a 77 y.o. male with multiple medical problems that presents for 1 year interval follow-up after left TCAR.  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  He did well during the procedure and woke up neurologically at his baseline.  Unfortunately on the evening after his TCAR he was noted to have some right-sided weakness and a code stroke was called.  He was found to have some small left MCA and basal ganglia infarct.  Fortunately he recovered quickly and went to rehab.   He is here today with his wife.  Taking Eliquis and aspirin.  No new neurologic symptoms since hospital discharge last year.  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   Diabetes mellitus without complication (HCC)    Diverticulosis    Frequency of urination    GERD (gastroesophageal reflux disease)    Gross hematuria    History of acute pyelonephritis    10-13-2012   History of CVA with residual deficit    2008--  left side of body weakness and foot drop (wears leg brace and uses cane)   History of DVT of lower extremity    2008--  cva   Hypercholesteremia    Hyperlipidemia    Hyperlipidemia    Hypertension    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 2008   Left leg DVT (HCC)    Neuromuscular disorder (HCC)    Parkinsons   S/P insertion of IVC (inferior vena caval) filter    2008   Urethral stricture    Urgency of urination    Urinary retention    Weakness of left side of body    secondary to cva 2008   Wears glasses     Wears hearing aid    bilateral-- wears intermittantly    Past Surgical History:  Procedure Laterality Date   CYSTOSCOPY WITH RETROGRADE URETHROGRAM N/A 10/23/2015   Procedure: CYSTOSCOPY WITH RETROGRADE URETHROGRAM;  Surgeon: Barron Alvine, MD;  Location: [facility];  Service: Urology;  Laterality: N/A;   CYSTOSCOPY WITH URETHRAL DILATATION N/A 10/23/2015   Procedure: CYSTOSCOPY WITH URETHRAL BALLOON DILATATION;  Surgeon: Barron Alvine, MD;  Location: [facility];  Service: Urology;  Laterality: N/A;  BALLOON DILATION    IVC FILTER PLACEMENT ([facility] HX)  2008   LAPAROSCOPIC CHOLECYSTECTOMY SINGLE PORT N/A 01/09/2021   Procedure: LAPAROSCOPIC CHOLECYSTECTOMY WITH IOC;  Surgeon: Karie Soda, MD;  Location: [facility];  Service: General;  Laterality: N/A;  90 MIN   TRANSCAROTID ARTERY REVASCULARIZATION  Left 05/12/2021   Procedure: LEFT TRANSCAROTID ARTERY REVASCULARIZATION;  Surgeon: Cephus Shelling, MD;  Location: [facility];  Service: Vascular;  Laterality: Left;    Family History  Problem Relation Age of Onset   Diabetes Sister    Lung cancer Brother        Post 9/11 voluntary work in [employer]   Prostate cancer Brother    Other Mother        passed away young- non medical  Alzheimer's disease Father    Healthy Son     SOCIAL HISTORY: Social History   Tobacco Use   Smoking status: Former    Years: 10    Types: Cigarettes    Quit date: 10/26/1970    Years since quitting: 52.4   Smokeless tobacco: Never  Substance Use Topics   Alcohol use: No    Allergies  Allergen Reactions   Fluconazole Rash   Propofol Other (See Comments)    "hiccups for weeks"  Other reaction(s): Hiccups   Lisinopril Cough   Sulfamethoxazole-Trimethoprim Diarrhea    Current Outpatient Medications  Medication Sig Dispense Refill   Acidophilus Lactobacillus CAPS See admin instructions.     ACIDOPHILUS LACTOBACILLUS PO Take 1 tablet by mouth at bedtime.     apixaban  (ELIQUIS) 5 MG TABS tablet Take 1 tablet (5 mg total) by mouth 2 (two) times daily. 60 tablet 0   ascorbic acid (VITAMIN C) 500 MG tablet TAKE ONE TABLET BY MOUTH DAILY FOR PREVENTION OF UTI     aspirin EC 81 MG tablet Take 81 mg by mouth daily. Swallow whole.     benzonatate (TESSALON) 100 MG capsule Take 100 mg by mouth 3 (three) times daily as needed for cough.     Blood Glucose Monitoring Suppl (ONETOUCH VERIO FLEX SYSTEM) w/Device KIT check blood sugar     Carbidopa-Levodopa ER (SINEMET CR) 25-100 MG tablet controlled release Take 2 1/2 tablets by mouth twice a day and take 1 tablet once a day with supper - taken at 0900, 1300, 1700     cephALEXin (KEFLEX) 500 MG capsule TAKE ONE CAPSULE BY MOUTH TWICE A DAY FOR UTI     Cholecalciferol (VITAMIN D3) 50 MCG (2000 UT) TABS 1 tablet     Cholecalciferol 50 MCG (2000 UT) TABS Take 2,000 Units by mouth daily.     feeding supplement (ENSURE ENLIVE / ENSURE PLUS) LIQD Take 237 mLs by mouth 2 (two) times daily between meals. 10000 mL 0   finasteride (PROSCAR) 5 MG tablet Take 5 mg by mouth at bedtime.     Fluocinolone Acetonide Body 0.01 % OIL Apply 1 application. topically See admin instructions. Apply thin film to the scalp 1-2 times daily as needed for itching     hydrOXYzine (ATARAX) 10 MG tablet Take 10 mg by mouth 3 (three) times daily as needed.     memantine (NAMENDA) 10 MG tablet Take 20 mg by mouth at bedtime.     metFORMIN (GLUCOPHAGE) 500 MG tablet Take 500 mg by mouth at bedtime.     methenamine (HIPREX) 1 g tablet Take 1 g by mouth 2 (two) times daily. for infection prevention     methenamine (HIPREX) 1 g tablet 1 tablet     nystatin-triamcinolone ointment (MYCOLOG) Apply 1 application. topically See admin instructions. Apply thin film to groin and leg twice a day     omeprazole (PRILOSEC) 20 MG capsule Take 20 mg by mouth daily.     ondansetron (ZOFRAN-ODT) 4 MG disintegrating tablet Take 1 tablet (4 mg total) by mouth every 8 (eight)  hours as needed for nausea or vomiting. 10 tablet 0   Pimavanserin Tartrate (NUPLAZID) 34 MG CAPS Take 34 mg by mouth daily.     polyethylene glycol (MIRALAX / GLYCOLAX) 17 g packet Take 17 g by mouth daily as needed for mild constipation.     rasagiline (AZILECT) 1 MG TABS tablet Take 1 mg by mouth daily.     simvastatin (  ZOCOR) 40 MG tablet Take 40 mg by mouth at bedtime.     tacrolimus (PROTOPIC) 0.1 % ointment Apply 1 application. topically See admin instructions. Apply to groin, armpit, and bottom when skin is broken out twice a day as needed     No current facility-administered medications for this visit.    REVIEW OF SYSTEMS:  [X]  denotes positive finding, [ ]  denotes negative finding Cardiac  Comments:  Chest pain or chest pressure:    Shortness of breath upon exertion:    Short of breath when lying flat:    Irregular heart rhythm:        Vascular    Pain in calf, thigh, or hip brought on by ambulation:    Pain in feet at night that wakes you up from your sleep:     Blood clot in your veins:    Leg swelling:         Pulmonary    Oxygen at home:    Productive cough:     Wheezing:         Neurologic    Sudden weakness in arms or legs:     Sudden numbness in arms or legs:     Sudden onset of difficulty speaking or slurred speech:    Temporary loss of vision in one eye:     Problems with dizziness:         Gastrointestinal    Blood in stool:     Vomited blood:         Genitourinary    Burning when urinating:     Blood in urine:        Psychiatric    Major depression:         Hematologic    Bleeding problems:    Problems with blood clotting too easily:        Skin    Rashes or ulcers:        Constitutional    Fever or chills:      PHYSICAL EXAM: There were no vitals filed for this visit.   GENERAL: The patient is a well-nourished male, in no acute distress. The vital signs are documented above. CARDIAC: There is a regular rate and rhythm.  VASCULAR:   Left neck incision c/d/i PULMONARY: No respiratory distress. MUSCULOSKELETAL: There are no major deformities or cyanosis. NEUROLOGIC:  Left upper extremity 0 out of 5 but able to lift his left leg off the wheelchair from his previous stroke years ago Right upper extremity 4+ out of 5 and right lower extremity 4+ out of 5  DATA:   Carotid duplex today shows known right ICA occlusion and a widely patent left carotid stent after TCAR.  Assessment/Plan:  77 year old male status post left TCAR on 05/02/21 for asymptomatic high-grade greater than 80% stenosis in the setting of contralateral ICA occlusion.  He is here for interval 1 year follow-up for ongoing carotid surveillance.  He had a small stroke in the hospital with small left MCA/basal ganglia infarcts after the TCAR.  Fortunately he went to CIR and was able to rehabilitate.  Discussed his duplex today shows widely patent left carotid stent following his TCAR last year.  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  Discussed I will see him again in 1 year with duplex.  He needs to continue aspirin from my standpoint and is on Eliquis aspirin.  Cephus Shelling, MD [facility]  Office: [phone]

## 2023-03-16 ENCOUNTER — Encounter: Payer: Self-pay | Admitting: Vascular Surgery

## 2023-03-16 ENCOUNTER — Ambulatory Visit (INDEPENDENT_AMBULATORY_CARE_PROVIDER_SITE_OTHER): Payer: Medicare Other | Admitting: Vascular Surgery

## 2023-03-16 ENCOUNTER — Ambulatory Visit (HOSPITAL_COMMUNITY)
Admission: RE | Admit: 2023-03-16 | Discharge: 2023-03-16 | Disposition: A | Discharge status: Home / Self Care | Payer: Medicare Other | Source: Ambulatory Visit | Attending: Vascular Surgery | Admitting: Vascular Surgery

## 2023-03-16 VITALS — BP 145/74 | HR 57 | Temp 97.4°F | Resp 18 | Ht 74.0 in | Wt 226.0 lb

## 2023-03-16 DIAGNOSIS — I6522 Occlusion and stenosis of left carotid artery: Secondary | ICD-10-CM

## 2023-03-25 ENCOUNTER — Other Ambulatory Visit: Payer: Self-pay

## 2023-03-25 DIAGNOSIS — I6522 Occlusion and stenosis of left carotid artery: Secondary | ICD-10-CM

## 2023-07-08 ENCOUNTER — Ambulatory Visit: Payer: Non-veteran care

## 2023-07-22 ENCOUNTER — Ambulatory Visit: Payer: Medicare Other | Attending: General Practice

## 2023-07-22 DIAGNOSIS — R1310 Dysphagia, unspecified: Secondary | ICD-10-CM | POA: Diagnosis not present

## 2023-07-22 DIAGNOSIS — R41841 Cognitive communication deficit: Secondary | ICD-10-CM | POA: Diagnosis not present

## 2023-07-22 DIAGNOSIS — R471 Dysarthria and anarthria: Secondary | ICD-10-CM | POA: Insufficient documentation

## 2023-07-22 DIAGNOSIS — G20C Parkinsonism, unspecified: Secondary | ICD-10-CM | POA: Diagnosis not present

## 2023-07-22 NOTE — Patient Instructions (Signed)
   Do 10 loud "ah" twice a day.  I am recommending a modified barium swallow examination. (MBS)  If you end up getting home health therapy, please call to inform us. I will discharge Robert [name] and then home health ST can begin.

## 2023-07-22 NOTE — Therapy (Signed)
OUTPATIENT SPEECH LANGUAGE PATHOLOGY PARKINSON'S EVALUATION   Patient Name: Robert [name] MRN: [id number] DOB:04/11/1946, 77 y.o., male Today's Date: 07/22/2023  PCP: Noel Gerold, MD REFERRING PROVIDER: Dr. 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   Diabetes mellitus without complication (HCC)    Diverticulosis    Frequency of urination    GERD (gastroesophageal reflux disease)    Gross hematuria    History of acute pyelonephritis    10-13-2012   History of CVA with residual deficit    2008--  left side of body weakness and foot drop (wears leg brace and uses cane)   History of DVT of lower extremity    2008--  cva   Hypercholesteremia    Hyperlipidemia    Hyperlipidemia    Hypertension    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 2008   Left leg DVT (HCC)    Neuromuscular disorder (HCC)    Parkinsons   S/P insertion of IVC (inferior vena caval) filter    2008   Urethral stricture    Urgency of urination    Urinary retention    Weakness of left side of body    secondary to cva 2008   Wears glasses    Wears hearing aid    bilateral-- wears intermittantly   Past Surgical History:  Procedure Laterality Date   CYSTOSCOPY WITH RETROGRADE URETHROGRAM N/A 10/23/2015   Procedure: CYSTOSCOPY WITH RETROGRADE URETHROGRAM;  Surgeon: Barron Alvine, MD;  [facility];  Service: Urology;  Laterality: N/A;   CYSTOSCOPY WITH URETHRAL DILATATION N/A 10/23/2015   Procedure: CYSTOSCOPY WITH URETHRAL BALLOON DILATATION;  Surgeon: Barron Alvine,  MD;  Location: [facility];  Service: Urology;  Laterality: N/A;  BALLOON DILATION    IVC FILTER PLACEMENT ([facility] HX)  2008   LAPAROSCOPIC CHOLECYSTECTOMY SINGLE PORT N/A 01/09/2021   Procedure: LAPAROSCOPIC CHOLECYSTECTOMY WITH IOC;  Surgeon: Karie Soda, MD;  Location: [facility];  Service: General;  Laterality: N/A;  90 MIN   TRANSCAROTID ARTERY REVASCULARIZATION  Left 05/12/2021   Procedure: LEFT TRANSCAROTID ARTERY REVASCULARIZATION;  Surgeon: Cephus Shelling, MD;  Location: [facility] OR;  Service: Vascular;  Laterality: Left;   Patient Active Problem List   Diagnosis Date Noted   Diabetes mellitus type II, non insulin dependent (HCC) 02/10/2022   Embolic stroke (HCC) 05/15/2021   Spasticity as late effect of cerebrovascular accident (CVA) 05/15/2021   Asymptomatic carotid artery stenosis without infarction, left 05/12/2021   Carotid artery disease (HCC) 04/29/2021   Acute pancreatitis    Palliative care by specialist    Goals of care, counseling/discussion    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 01/07/2021   Gallstone pancreatitis 01/07/2021   Pressure injury of skin 01/05/2021   Sepsis secondary to UTI (HCC) 11/10/2020   AKI (acute kidney injury) (HCC) 11/10/2020   Hyperkalemia 11/10/2020   Parkinson's disease 11/10/2020  Hyperglycemia 11/10/2020   Elevated LFTs 11/10/2020   Diarrhea    Other hydronephrosis    History of DVT    Urinary retention 08/14/2019   History of CVA with residual deficit    BPH/urethral stricture with chronic Foley catheter    Orthostatic hypotension 08/13/2019   Hyperlipidemia    History of CVA with residual left hemiparesis 12/06/2017   Gait abnormality 11/22/2017   UTI (urinary tract infection) 04/22/2017   Acute lower UTI 04/22/2017   Tremor 04/22/2017   CAUTI? 09/29/2012   Nausea 09/29/2012   Fever 09/29/2012   Uncontrolled hypertension 09/29/2012   H/O: CVA (cerebrovascular accident) 09/29/2012   Hearing  loss 09/29/2012    ONSET DATE: diagnosed approx 2018, script written 06/10/23  REFERRING DIAG: Parkinsonism  THERAPY DIAG:  Dysarthria and anarthria  Dysphagia, unspecified type  Cognitive communication deficit  Rationale for Evaluation and Treatment: Rehabilitation  SUBJECTIVE:   SUBJECTIVE STATEMENT: "Yeah, they ask me (to repeat)." Pt accompanied by: significant other and aide  PERTINENT HISTORY: Pt with CVAs in 2009 and 2022 (following stent placement)  PAIN:  Are you having pain? No  FALLS: Has patient fallen in last 6 months?  No  LIVING ENVIRONMENT: Lives with: lives with their spouse Lives in: House/apartment  PLOF:  Level of assistance: Needed assistance with ADLs, Needed assistance with IADLS Employment: Retired  PATIENT GOALS: Improve loudness  OBJECTIVE:   COGNITION: Overall cognitive status: Impaired Areas of impairment: Memory Comments: Pt looked to wife to answer questions about his medical history regarding his CVAs.   MOTOR SPEECH: Overall motor speech: impaired Level of impairment: Phrase Respiration: thoracic breathing  Phonation: low vocal intensity Resonance: WFL Articulation: Impaired - word, phrase, sentence, conversation Intelligibility: Intelligibility reduced Effective technique: increased vocal intensity   ORAL MOTOR EXAMINATION: Overall status: Impaired: Labial: Bilateral (ROM and Coordination) Lingual: Bilateral (ROM and Coordination) Facial: Bilateral (ROM) Comments: labial and lingual ROM was improved with a model or with tactile cues   OBJECTIVE VOICE ASSESSMENT: Sustained "ah" maximum phonation time: 5.9 seconds Sustained "ah" loudness average: 87 dB - SLP needed to shape this but after 3 reps pt was able to make appropriate /a/ Conversational loudness average: 57-68 dB Conversational loudness range: 65 dB Voice quality: low vocal intensity Stimulability trials: Given SLP modeling and occasional min-mod cues, loudness  average increased to 70dB (range of 63 to 73dB) with question and answer level. At conversation level, with usual mod cues pt was able to improve conversational loudness in 30 seconds conversation and then had rapid decr in volume.   Pt reports difficulty with swallowing pills and solids, and a MBS is warranted.   PATIENT REPORTED OUTCOME MEASURES (PROM): Communication Effectiveness Survey: to be administered during first 2 sessions   TODAY'S TREATMENT:  DATE:  N/A   PATIENT EDUCATION: Education details: see "today's treatment" Person educated: Patient Education method: Explanation, Demonstration, Verbal cues, and Handouts Education comprehension: verbalized understanding, returned demonstration, verbal cues required, and needs further education   HOME EXERCISE PROGRAM: 10 strong "ah" BID. Eventually, pt will complete glides and everyday sentences, when and if clinically appropriate.     GOALS: Goals reviewed with patient? No   SHORT TERM GOALS: Target date: 08/20/23   pt will produce loud /a/ or "hey!" with at least upper 80s dB average over three sessions Baseline: Goal status: INITIAL   2.  Pt will produce 16/20 sentence responses with WNL volume over two sessions Baseline:  Goal status: INITIAL   3.  Pt will produce 2 minutes simple conversation with WNL volume over three sessions Baseline:  Goal status: INITIAL   4.  Pt will complete MBS to assess swallowing function and add goals PRN Baseline:  Goal status: INITIAL     LONG TERM GOALS: Target date: 09/17/23   Pt will report utilizing memory strategies to improve overall memory, in or between 3 sessions Baseline: Goal status: INITIAL   2.  Pt will produce 10 minutes simple-mod complex conversation with WNL volume given rare nonverbal cues over three sessions Baseline:  Goal  status: INITIAL   3.  Pt's PROM will show improvement from initial administration Baseline:  Goal status: INITIAL   ASSESSMENT:   CLINICAL IMPRESSION: Patient is a 76y.o. M who was seen today for assessment of speech skills in light of his Parkinson disease diagnosed approx 8 years ago. Pt's CVA in 2022 resulted in dense lt sided weakness, including oral musculature. Often times during the evaluation, Lamont looked at wife or waited for wife to answer questions regarding medical history or questions regarding hobbies, indicating possible memory disorder. After CVA in 2022, pt had skilled ST in the hospital and for [facility], according to [facility]. Anup endorses that people have demonstrated they are having more difficulty understanding him in the last 6-12 months Tyler Aas and [place] corroborate). He endorses occasional overt s/sx dysphagia at this time and MBS is recommended. Pt may receive home health and if so, MBS should still be completed but pt will be discharged from OP ST.   OBJECTIVE IMPAIRMENTS: Objective impairments include attention, memory, dysarthria, and dysphagia. These impairments are limiting patient from managing medications, managing appointments, managing finances, household responsibilities, ADLs/IADLs, effectively communicating at home and in community, and safety when swallowing.Factors affecting potential to achieve goals and functional outcome are ability to learn/carryover information, cooperation/participation level, and previous level of function.. Patient will benefit from skilled SLP services to address above impairments and improve overall function.   REHAB POTENTIAL: Good   PLAN:   SLP FREQUENCY: 2x/week   SLP DURATION: 8 weeks   PLANNED INTERVENTIONS: Aspiration precaution training, Pharyngeal strengthening exercises, Diet toleration management , Environmental controls, Trials of upgraded texture/liquids, Cueing hierachy, Cognitive reorganization, Internal/external aids,  Functional tasks, SLP instruction and feedback, Compensatory strategies, Patient/family education, and MBS (possible)   Jazir Newey, CCC-SLP 07/22/2023, 12:45 PM

## 2023-08-09 ENCOUNTER — Encounter (HOSPITAL_COMMUNITY): Payer: Self-pay

## 2023-08-09 ENCOUNTER — Emergency Department (HOSPITAL_COMMUNITY)
Admission: EM | Admit: 2023-08-09 | Discharge: 2023-08-09 | Disposition: A | Discharge status: Home / Self Care | Payer: No Typology Code available for payment source | Attending: Emergency Medicine | Admitting: Emergency Medicine

## 2023-08-09 ENCOUNTER — Other Ambulatory Visit: Payer: Self-pay

## 2023-08-09 DIAGNOSIS — Z79899 Other long term (current) drug therapy: Secondary | ICD-10-CM | POA: Diagnosis not present

## 2023-08-09 DIAGNOSIS — Z8673 Personal history of transient ischemic attack (TIA), and cerebral infarction without residual deficits: Secondary | ICD-10-CM | POA: Insufficient documentation

## 2023-08-09 DIAGNOSIS — Z7982 Long term (current) use of aspirin: Secondary | ICD-10-CM | POA: Diagnosis not present

## 2023-08-09 DIAGNOSIS — Z7901 Long term (current) use of anticoagulants: Secondary | ICD-10-CM | POA: Diagnosis not present

## 2023-08-09 DIAGNOSIS — R040 Epistaxis: Secondary | ICD-10-CM | POA: Diagnosis not present

## 2023-08-09 DIAGNOSIS — I10 Essential (primary) hypertension: Secondary | ICD-10-CM | POA: Diagnosis not present

## 2023-08-09 NOTE — ED Triage Notes (Signed)
BIB EMS from home for nosebleed that is resolved upon EMS arriving. Nosebleed lasted about 45 minutes. Last time pt had nosebleed, he needed it to be cauterized. Pt is on eliquis, no bleeding on arrival.

## 2023-08-09 NOTE — Discharge Instructions (Signed)
In the event of another nosebleed I would hold firm pressure at the bulb of the nose for 10 to 15 minutes without checking, if you have continued bleeding at that time you may want to seek further evaluation.  At this time would continue applying some intranasal Vaseline for the next several days to prevent crusting on the nose from falling off and causing a repeat nosebleed.

## 2023-08-09 NOTE — ED Provider Notes (Signed)
[place] EMERGENCY DEPARTMENT AT [facility] Provider Note   CSN: [id number] Arrival date & time: 08/09/23  1138     History  Chief Complaint  Patient presents with   Epistaxis    Robert [name] is a 77 y.o. male with past medical history seen for hypertension, acid reflux, hyperlipidemia, previous DVT, CVA, currently taking Eliquis who presents with concern for nosebleed.  On EMS arrival caretaker reports nosebleed lasted for about 45 minutes.  His caretaker reports that it needed to be cauterized the last time.  She held pressure and packed his nose with Vaseline and reports that he has had no bleeding for over an hour at this time.  Patient with no complaints.   Epistaxis      Home Medications Prior to Admission medications   Medication Sig Start Date End Date Taking? Authorizing Provider  Acidophilus Lactobacillus CAPS See admin instructions.    [provider]  ACIDOPHILUS LACTOBACILLUS PO Take 1 tablet by mouth at bedtime. 02/03/22   [provider]  apixaban (ELIQUIS) 5 MG TABS tablet Take 1 tablet (5 mg total) by mouth 2 (two) times daily. 05/27/21   Love, Evlyn Kanner, PA-C  ascorbic acid (VITAMIN C) 500 MG tablet TAKE ONE TABLET BY MOUTH DAILY FOR PREVENTION OF UTI 02/17/22   [provider]  aspirin EC 81 MG tablet Take 81 mg by mouth daily. Swallow whole.    [provider]  benzonatate (TESSALON) 100 MG capsule Take 100 mg by mouth 3 (three) times daily as needed for cough. Patient not taking: Reported on 03/16/2023 10/01/21   [provider]  Blood Glucose Monitoring Suppl (ONETOUCH VERIO FLEX SYSTEM) w/Device KIT check blood sugar 11/25/20   [provider]  Carbidopa-Levodopa ER (SINEMET CR) 25-100 MG tablet controlled release Take 2 1/2 tablets by mouth twice a day and take 1 tablet once a day with supper - taken at 0900, 1300, 1700 12/18/21   [provider]  cephALEXin (KEFLEX) 500 MG capsule TAKE  ONE CAPSULE BY MOUTH TWICE A DAY FOR UTI 03/13/22   [provider]  Cholecalciferol (VITAMIN D3) 50 MCG (2000 UT) TABS 1 tablet    [provider]  Cholecalciferol 50 MCG (2000 UT) TABS Take 2,000 Units by mouth daily. 12/23/21   [provider]  feeding supplement (ENSURE ENLIVE / ENSURE PLUS) LIQD Take 237 mLs by mouth 2 (two) times daily between meals. 02/11/22   Rolly Salter, MD  finasteride (PROSCAR) 5 MG tablet Take 5 mg by mouth at bedtime. 01/07/22   [provider]  Fluocinolone Acetonide Body 0.01 % OIL Apply 1 application. topically See admin instructions. Apply thin film to the scalp 1-2 times daily as needed for itching 11/06/21   [provider]  hydrOXYzine (ATARAX) 10 MG tablet Take 10 mg by mouth 3 (three) times daily as needed.    [provider]  memantine (NAMENDA) 10 MG tablet Take 20 mg by mouth at bedtime.    [provider]  metFORMIN (GLUCOPHAGE) 500 MG tablet Take 500 mg by mouth at bedtime.    [provider]  methenamine (HIPREX) 1 g tablet Take 1 g by mouth 2 (two) times daily. for infection prevention 02/03/22   [provider]  methenamine (HIPREX) 1 g tablet 1 tablet    [provider]  nystatin-triamcinolone ointment (MYCOLOG) Apply 1 application. topically See admin instructions. Apply thin film to groin and leg twice a day 10/26/21  [provider]  omeprazole (PRILOSEC) 20 MG capsule Take 20 mg by mouth daily.    [provider]  ondansetron (ZOFRAN-ODT) 4 MG disintegrating tablet Take 1 tablet (4 mg total) by mouth every 8 (eight) hours as needed for nausea or vomiting. Patient not taking: Reported on 03/16/2023 11/14/22   Renne Crigler, PA-C  Pimavanserin Tartrate (NUPLAZID) 34 MG CAPS Take 34 mg by mouth daily.    [provider]  polyethylene glycol (MIRALAX / GLYCOLAX) 17 g packet Take 17 g by mouth daily as needed for mild constipation. Patient  not taking: Reported on 03/16/2023 05/12/17   [provider]  rasagiline (AZILECT) 1 MG TABS tablet Take 1 mg by mouth daily.    [provider]  simvastatin (ZOCOR) 40 MG tablet Take 40 mg by mouth at bedtime. 09/05/15   [provider]  tacrolimus (PROTOPIC) 0.1 % ointment Apply 1 application. topically See admin instructions. Apply to groin, armpit, and bottom when skin is broken out twice a day as needed 11/06/21   [provider]      Allergies    Fluconazole, Propofol, Lisinopril, and Sulfamethoxazole-trimethoprim    Review of Systems   Review of Systems  HENT:  Positive for nosebleeds.   All other systems reviewed and are negative.   Physical Exam Updated Vital Signs BP (!) 155/80 (BP Location: Right Arm)   Pulse (!) 110   Temp 98.3 F (36.8 C) (Oral)   Resp 18   Ht 6\' 2"  (1.88 m)   Wt 102.5 kg   SpO2 99%   BMI 29.01 kg/m  Physical Exam Vitals and nursing note reviewed.  Constitutional:      General: He is not in acute distress.    Appearance: Normal appearance.  HENT:     Head: Normocephalic and atraumatic.     Nose:     Comments: Anterior nare on the left with signs of resolved epistaxis.  There is some crusting and Vaseline in place.  No active bleeding at this time. Eyes:     General:        Right eye: No discharge.        Left eye: No discharge.  Cardiovascular:     Rate and Rhythm: Normal rate and regular rhythm.  Pulmonary:     Effort: Pulmonary effort is normal. No respiratory distress.  Musculoskeletal:        General: No deformity.  Skin:    General: Skin is warm and dry.  Neurological:     Mental Status: He is alert and oriented to person, place, and time.  Psychiatric:        Mood and Affect: Mood normal.        Behavior: Behavior normal.     ED Results / Procedures / Treatments   Labs (all labs ordered are listed, but only abnormal results are displayed) Labs Reviewed - No data to  display  EKG None  Radiology No results found.  Procedures Procedures    Medications Ordered in ED Medications - No data to display  ED Course/ Medical Decision Making/ A&P                                 Medical Decision Making  This is an overall well-appearing 77 year old gentleman who is on Eliquis and is brought to the emergency department secondary to anterior nosebleed resolved prior to arrival.  Discussed with patient caretaker  that I would not recommend any additional treatment at this time despite his blood thinner usage.  If bleeding returns he may need further evaluation, at this time holding pressure, Vaseline in case of return of nosebleed is reasonable, if no resolution with above, may need further evaluation at ED or with ENT. Final Clinical Impression(s) / ED Diagnoses Final diagnoses:  Anterior epistaxis    Rx / DC Orders ED Discharge Orders     None         [place] 08/09/23 1235    Bethann Berkshire, MD 08/12/23 1657

## 2023-08-10 DIAGNOSIS — R040 Epistaxis: Secondary | ICD-10-CM | POA: Diagnosis not present

## 2023-08-12 ENCOUNTER — Encounter: Payer: Self-pay | Admitting: Family Medicine

## 2023-08-13 ENCOUNTER — Other Ambulatory Visit (HOSPITAL_COMMUNITY): Payer: Self-pay | Admitting: *Deleted

## 2023-08-13 DIAGNOSIS — R1310 Dysphagia, unspecified: Secondary | ICD-10-CM

## 2023-08-13 DIAGNOSIS — R059 Cough, unspecified: Secondary | ICD-10-CM

## 2023-08-19 ENCOUNTER — Ambulatory Visit (HOSPITAL_COMMUNITY)
Admission: RE | Admit: 2023-08-19 | Discharge: 2023-08-19 | Disposition: A | Discharge status: Home / Self Care | Payer: Medicare Other | Source: Ambulatory Visit | Attending: Adult Health | Admitting: Adult Health

## 2023-08-19 ENCOUNTER — Ambulatory Visit (HOSPITAL_COMMUNITY)
Admission: RE | Admit: 2023-08-19 | Discharge: 2023-08-19 | Disposition: A | Discharge status: Home / Self Care | Payer: Medicare Other | Source: Ambulatory Visit | Attending: Family Medicine | Admitting: Family Medicine

## 2023-08-19 DIAGNOSIS — R1310 Dysphagia, unspecified: Secondary | ICD-10-CM

## 2023-08-19 DIAGNOSIS — I69354 Hemiplegia and hemiparesis following cerebral infarction affecting left non-dominant side: Secondary | ICD-10-CM | POA: Diagnosis not present

## 2023-08-19 DIAGNOSIS — I69391 Dysphagia following cerebral infarction: Secondary | ICD-10-CM | POA: Insufficient documentation

## 2023-08-19 DIAGNOSIS — G20A1 Parkinson's disease without dyskinesia, without mention of fluctuations: Secondary | ICD-10-CM | POA: Insufficient documentation

## 2023-08-19 DIAGNOSIS — R059 Cough, unspecified: Secondary | ICD-10-CM

## 2023-08-19 DIAGNOSIS — F0280 Dementia in other diseases classified elsewhere without behavioral disturbance: Secondary | ICD-10-CM | POA: Diagnosis not present

## 2023-08-19 DIAGNOSIS — R509 Fever, unspecified: Secondary | ICD-10-CM | POA: Diagnosis present

## 2023-08-19 DIAGNOSIS — I69322 Dysarthria following cerebral infarction: Secondary | ICD-10-CM | POA: Insufficient documentation

## 2023-08-19 DIAGNOSIS — R1312 Dysphagia, oropharyngeal phase: Secondary | ICD-10-CM | POA: Insufficient documentation

## 2023-08-19 NOTE — Progress Notes (Signed)
Modified Barium Swallow Study  Patient Details  Name: Robert [name] MRN: [id number] Date of Birth: 1945-12-26  Today's Date: 08/19/2023  Modified Barium Swallow completed.  Full report located under Chart Review in the Imaging Section.  History of Present Illness PMHx significant for CVA with residual L hemiparesis (2008, 2022), PD, dementia, chronic dysphagia since initial stroke. Was seen at [facility] to address dysarthria and dysphagia. Pt report supplemented by spouse d/t impaired communication (moderate dysarthria vs expressive languge deficits). Pt c/o globus sensation with solids, increased coughing with moving of increased phlegm. Ongoing for years. Is currently working with [facility] to address dysarthria. Pt reports to eating full range of solids, thin liquids.   Clinical Impression Modified Barium Swallow Study was completed this date with Robert [name], with pt demonstrating oropharyngeal dysphagia. Spouse and caregiver present for duration of study. Pt arrived via wheelchair. required max-A for transfer to fluro chair d/t mobility issues. Pt accepted PO trials of thin, mildly thick, and moderately thick liquids, puree, hard solid, and barium tablet halved with thin liquid. Pt fed by SLP for duration of assessment. Pt's oral phase was c/b good lingual control for directed bolus hold but challenges in A-P transit of boluses, evidenced by lingual pumping. Chewing completed with anterior teeth using mashing motion vs typical rotary chewing pattern. Pt demonstrated oral residue across consistencies and bolus volumes, to which he was sensate to supported by spontaneous subsequent swallows which ultimately cleared oral cavity. Pharyngeal impairments noted were with reduced tongue base retraction, absent anterior hyoid movement, reduced laryngeal elevation and laryngeal vestibule closure, occasionally only partial inversion of epiglottis. Despite deficits, airway invasion only noted x1  with sequential sips of mildly thick liquids. Pt with spontaneous re-swallow which was effective in clearing penetrated material from airway. No other bolus trials resulted in airway invasion. These deficits did result in moderate aggregation of consistencies, especially liquids, in vallecula, pyriform sinuses and lateral channels. Re-swallows decreased amount of residue but did not completely clear pharynx. Esophogeal sweep reaveals timely and complete transit of PO. Pt may benefit from implementation of dysphagia exercises to address aforementioned physiologic deficits and prevent further decline. Recommend thin liquids and regular solids (softened, if preferable) with use of general aspiration precautions, to include BID oral care. Factors that may increase risk of adverse event in presence of aspiration [employer] 2021): Limited mobility;Dependence for feeding and/or oral hygiene;Weak cough  Swallow Evaluation Recommendations Recommendations: PO diet PO Diet Recommendation: Dysphagia 3 (Mechanical soft);Thin liquids (Level 0) Liquid Administration via: Straw;Cup Medication Administration: Whole meds with liquid Supervision: Intermittent supervision/cueing for swallowing strategies Swallowing strategies  : Slow rate;Multiple dry swallows after each bite/sip Postural changes: Position pt fully upright for meals Oral care recommendations: Oral care BID (2x/day)      Maia Breslow 08/19/2023,2:05 PM

## 2023-08-31 ENCOUNTER — Encounter (HOSPITAL_BASED_OUTPATIENT_CLINIC_OR_DEPARTMENT_OTHER): Payer: No Typology Code available for payment source | Attending: General Surgery | Admitting: General Surgery

## 2023-08-31 DIAGNOSIS — E11622 Type 2 diabetes mellitus with other skin ulcer: Secondary | ICD-10-CM | POA: Diagnosis not present

## 2023-08-31 DIAGNOSIS — I69354 Hemiplegia and hemiparesis following cerebral infarction affecting left non-dominant side: Secondary | ICD-10-CM | POA: Diagnosis not present

## 2023-08-31 DIAGNOSIS — G20A1 Parkinson's disease without dyskinesia, without mention of fluctuations: Secondary | ICD-10-CM | POA: Insufficient documentation

## 2023-08-31 DIAGNOSIS — L98412 Non-pressure chronic ulcer of buttock with fat layer exposed: Secondary | ICD-10-CM | POA: Insufficient documentation

## 2023-08-31 NOTE — Progress Notes (Signed)
ELIER, ZELLARS ([id number]) [id number] Page 1 of 9 Visit Report for 08/31/2023 Chief Complaint Document Details Patient Name: Date of Service: Robert [name] 08/31/2023 9:00 A M Medical Record Number: [id number] Patient Account Number: [id number] Date of Birth/Sex: Treating RN: Robert [name]-[birth year] (77 y.o. M) Primary Care Provider: DO Carolanne Grumbling, A NGELA Other Clinician: Referring Provider: Treating Provider/Extender: Duanne Guess DO NNELLY, A NGELA Weeks in Treatment: 0 Information Obtained from: Patient Chief Complaint Patient is at the clinic for treatment of open buttock wounds Electronic Signature(s) Signed: 08/31/2023 10:22:29 AM By: Duanne Guess MD FACS Previous Signature: 08/31/2023 9:41:47 AM Version By: Duanne Guess MD FACS Entered By: Duanne Guess on 08/31/2023 10:22:29 -------------------------------------------------------------------------------- HPI Details Patient Name: Date of Service: Robert [name] 08/31/2023 9:00 A M Medical Record Number: [id number] Patient Account Number: [id number] Date of Birth/Sex: Treating RN: [birth year]Robert [name] (77 y.o. 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 He is minimally mobile due to his medical issues and presents to clinic today with bilateral gluteal ulcers. They appear more like shear-type injuries, rather than pressure. He is on a low-air loss mattress at home and has a customized chair provided by the [place]. Electronic Signature(s) Signed: 08/31/2023 10:[name]:29 AM By: Duanne Guess MD FACS Previous  Signature: 08/31/2023 9:52:11 AM Version By: Duanne Guess MD FACS Previous Signature: 08/31/2023 9:14:21 AM Version By: Duanne Guess MD FACS Entered By: Duanne Guess on 08/31/2023 10:[name]:28 -------------------------------------------------------------------------------- Physical Exam Details Patient Name: Date of Service: Robert [name] 08/31/2023 9:00 A M Medical Record Number: [id number] Patient Account Number: [id number] Robert [name], Robert [name] ([id number]) [id number] Page 2 of 9 Date of Birth/Sex: Treating RN: [birth year], Robert [name] (77 y.o. M) Primary Care Provider: Other Clinician: DO NNELLY, A NGELA Referring Provider: Treating Provider/Extender: Duanne Guess DO NNELLY, A NGELA Weeks in Treatment: 0 Constitutional . . . . No acute distress. Respiratory Normal work of breathing on room air. Notes 08/31/2023: On each side of his natal cleft, he has superficial abrasion-type injuries. The skin is otherwise in good condition. One of the wounds does go to the fat layer, but the rest are limited to breakdown of skin. Electronic Signature(s) Signed: 08/31/2023 10:25:50 AM By: Duanne Guess MD FACS Previous Signature: 08/31/2023 9:56:35 AM Version By: Duanne Guess MD FACS Entered By: Duanne Guess on 08/31/2023 10:25:49 -------------------------------------------------------------------------------- Physician Orders Details Patient Name: Date of Service: Robert [name] 08/31/2023 9:00 A M Medical Record Number: [id number] Patient Account Number: [id number] Date of Birth/Sex: Treating RN: Robert [name] (77 y.o. Robert [name] Primary Care Provider: DO Hardie Pulley Other Clinician: Referring Provider: Treating Provider/Extender: Duanne Guess DO NNELLY, A NGELA Weeks in Treatment: 0 The following information was scribed by: Zenaida Deed The information was scribed for: Duanne Guess Verbal / Phone Orders: No Diagnosis Coding ICD-10  Coding Code Description L98.412 Non-pressure chronic ulcer of buttock with fat layer exposed L98.411 Non-pressure chronic ulcer of buttock limited to breakdown of skin E11.622 Type 2 diabetes mellitus with other skin ulcer I69.354 Hemiplegia and hemiparesis following cerebral infarction affecting left non-dominant side Follow-up Appointments ppointment in 2 weeks. - Dr. Lady Gary RM 1 ****[facility]**** Return A Thursday 11/21 @ 10:30 am Anesthetic Wound #  1 Left Gluteus (In clinic) Topical Lidocaine 4% applied to wound bed Wound #2 Right Gluteus (In clinic) Topical Lidocaine 4% applied to wound bed Bathing/ Shower/ Hygiene May shower and wash wound with soap and water. Off-Loading Turn and reposition every 2 hours Non Wound Condition pply the following to affected area as directed: - hydrocortisone 1% cream to buttocks daily for itching A Home Health New wound care orders this week; continue Home Health for wound care. May utilize formulary equivalent dressing for wound treatment orders unless otherwise specified. Other Home Health Orders/Instructions: Robert [name], Robert [name] ([id number]) [id number] Page 3 of 9 Wound Treatment Wound #1 - Gluteus Wound Laterality: Left Topical: Triamcinolone 1 x Per Day/30 Days Discharge Instructions: Apply Triamcinolone mix with calmine/zinc paste for itching Prim Dressing: Maxorb Extra Ag+ Alginate Dressing, 4x4.75 (in/in) 1 x Per Day/30 Days ary Discharge Instructions: Apply to wound bed as instructed Secondary Dressing: Zetuvit Plus Silicone Border Dressing 4x4 (in/in) 1 x Per Day/30 Days Discharge Instructions: Apply silicone border over primary dressing as directed. Wound #2 - Gluteus Wound Laterality: Right Topical: Triamcinolone 1 x Per Day/30 Days Discharge Instructions: Apply Triamcinolone mix with calmine/zinc paste for itching Prim Dressing: Maxorb Extra Ag+ Alginate Dressing, 4x4.75 (in/in) 1 x Per Day/30  Days ary Discharge Instructions: Apply to wound bed as instructed Secondary Dressing: Zetuvit Plus Silicone Border Dressing 4x4 (in/in) 1 x Per Day/30 Days Discharge Instructions: Apply silicone border over primary dressing as directed. Electronic Signature(s) Signed: 08/31/2023 10:39:53 AM By: Duanne Guess MD FACS Signed: 08/31/2023 5:09:47 PM By: Zenaida Deed RN, BSN Entered By: Zenaida Deed on 08/31/2023 10:33:06 -------------------------------------------------------------------------------- Problem List Details Patient Name: Date of Service: Robert [name] 08/31/2023 9:00 A M Medical Record Number: [id number] Patient Account Number: [id number] Date of Birth/Sex: Treating RN: [birth year]-Robert [name] (77 y.o. M) Primary Care Provider: DO Carolanne Grumbling, A NGELA Other Clinician: Referring Provider: Treating Provider/Extender: Duanne Guess DO NNELLY, A NGELA Weeks in Treatment: 0 Active Problems ICD-10 Encounter Code Description Active Date MDM Diagnosis L98.412 Non-pressure chronic ulcer of buttock with fat layer exposed 08/31/2023 No Yes L98.411 Non-pressure chronic ulcer of buttock limited to breakdown of skin 08/31/2023 No Yes E11.622 Type 2 diabetes mellitus with other skin ulcer 08/31/2023 No Yes I69.354 Hemiplegia and hemiparesis following cerebral infarction affecting left non- 08/31/2023 No Yes dominant side Inactive Problems Resolved Problems Electronic Signature(s) Signed: 08/31/2023 10:21:24 AM By: Duanne Guess MD FACS Robert [name], Robert [name] ([id number]) AM By: Duanne Guess MD FACS [phone].pdf Page 4 of 9 Signed: 08/31/2023 10:21:24 Previous Signature: 08/31/2023 9:43:32 AM Version By: Duanne Guess MD FACS Previous Signature: 08/31/2023 9:42:20 AM Version By: Duanne Guess MD FACS Previous Signature: 08/31/2023 9:08:35 AM Version By: Duanne Guess MD FACS Entered By: Duanne Guess on 08/31/2023  10:21:[name] -------------------------------------------------------------------------------- Progress Note Details Patient Name: Date of Service: Robert [name] 08/31/2023 9:00 A M Medical Record Number: [id number] Patient Account Number: [id number] Date of Birth/Sex: Treating RN: Robert [name], Robert [name] (77 y.o. 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 He is minimally mobile due to his medical issues and  presents to clinic today with bilateral gluteal ulcers. They appear more like shear-type injuries, rather than pressure. He is on a low-air loss mattress at home and has a customized chair provided by the [place]. Patient History Information obtained from Patient, Caregiver, Chart. Allergies fluconazole (Reaction: rash), propofol (Reaction: hiccups), lisinopril (Reaction: cough), Sulfa (Sulfonamide Antibiotics) (Reaction: diarrhea) Family History Unknown History. Social History Former smoker - quit 40 yrs ago, Marital Status - Married, Alcohol Use - Never, Drug Use - No History, Caffeine Use - Daily - coffee. Medical History Eyes Patient has history of Cataracts - mild Denies history of Glaucoma, Optic Neuritis Ear/Nose/Mouth/Throat Denies history of Chronic sinus problems/congestion, Middle ear problems Cardiovascular Patient has history of Deep Vein Thrombosis, Hypertension Endocrine Patient has history of Type II Diabetes Denies history of Type I Diabetes Genitourinary Denies  history of End Stage Renal Disease Integumentary (Skin) Denies history of History of Burn Oncologic Denies history of Received Chemotherapy, Received Radiation Psychiatric Denies history of Anorexia/bulimia, Confinement Anxiety Patient is treated with Oral Agents. Blood sugar is tested. Medical A Surgical History Notes nd Ear/Nose/Mouth/Throat hearing loss Cardiovascular carotid artery disease, embolic stroke, hyperlipidemia Gastrointestinal hx gallstone pancreatitis Genitourinary foley cath, urinary retention BPH urethral stricture Neurologic left hemiplegia, CVA x2 Review of Systems (ROS) Constitutional Symptoms [facility]) Robert [name], Robert [name] ([id number]) [id number] Page 5 of 9 Complains or has symptoms of Fever - low grade. Eyes Complains or has symptoms of Glasses / Contacts. Denies complaints or symptoms of Dry Eyes, Vision Changes. Ear/Nose/Mouth/Throat Denies complaints or symptoms of Chronic sinus problems or rhinitis. Respiratory Denies complaints or symptoms of Chronic or frequent coughs, Shortness of Breath. Cardiovascular Denies complaints or symptoms of Chest pain. Gastrointestinal Denies complaints or symptoms of Frequent diarrhea, Nausea, Vomiting. Genitourinary Denies complaints or symptoms of Frequent urination. Integumentary (Skin) Complains or has symptoms of Wounds - gluteus. Musculoskeletal Complains or has symptoms of Muscle Weakness. Denies complaints or symptoms of Muscle Pain. Neurologic Complains or has symptoms of Numbness/parasthesias. Psychiatric Denies complaints or symptoms of Claustrophobia. Objective Constitutional No acute distress. Vitals Time Taken: 9:12 AM, Height: 75 in, Weight: 225 lbs, BMI: 28.1, Temperature: 98.9 F, Pulse: 98 bpm, Respiratory Rate: 18 breaths/min, Blood Pressure: 125/72 mmHg, Capillary Blood Glucose: 112 mg/dl. General Notes: glucose per pt report this am Respiratory Normal  work of breathing on room air. General Notes: 08/31/2023: On each side of his natal cleft, he has superficial abrasion-type injuries. The skin is otherwise in good condition. One of the wounds does go to the fat layer, but the rest are limited to breakdown of skin. Integumentary (Hair, Skin) Wound #1 status is Open. Original cause of wound was Gradually Appeared. The date acquired was: 08/26/2021. The wound is located on the Left Gluteus. The wound measures 0.5cm length x 0.4cm width x 0.1cm depth; 0.157cm^2 area and 0.016cm^3 volume. There is no tunneling or undermining noted. There is a medium amount of serosanguineous drainage noted. The wound margin is flat and intact. There is large (67-100%) red granulation within the wound bed. There is no necrotic tissue within the wound bed. The periwound skin appearance had no abnormalities noted for texture. The periwound skin appearance had no abnormalities noted for moisture. The periwound skin appearance had no abnormalities noted for color. Periwound temperature was noted as No Abnormality. Wound #2 status is Open. Original cause of wound was Gradually Appeared. The date acquired was: 08/26/2021. The wound is located on the Right Gluteus. The wound measures 1.1cm length x 0.9cm width x 0.1cm depth;  0.778cm^2 area and 0.078cm^3 volume. There is Fat Layer (Subcutaneous Tissue) exposed. There is no tunneling noted. There is a medium amount of serosanguineous drainage noted. The wound margin is flat and intact. There is large (67-100%) red granulation within the wound bed. There is no necrotic tissue within the wound bed. The periwound skin appearance had no abnormalities noted for texture. The periwound skin appearance had no abnormalities noted for moisture. The periwound skin appearance had no abnormalities noted for color. Periwound temperature was noted as No Abnormality. 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 - Dr. Lady Gary RM 1 ****[facility]**** Thursday 11/21 @ 10:30 am Anesthetic: Wound #1 Left Gluteus: (In clinic) Topical Lidocaine 4% applied to wound bed Wound #2 Right GluteusSKYLIER, Robert [name] ([id number]) [id number] Page 6 of 9 (In clinic) Topical Lidocaine 4% applied to wound bed Bathing/ Shower/ Hygiene: May shower and wash wound with soap and water. Off-Loading: Turn and reposition every 2 hours Non Wound Condition: Apply the following to affected area as directed: - hydrocortisone 1% cream to buttocks daily for itching Home Health: New wound care orders this week; continue Home Health for wound care. May utilize formulary equivalent dressing for wound treatment orders unless otherwise specified. Other Home Health Orders/Instructions: - [facility] WOUND #1: - Gluteus Wound Laterality: Left Topical: Triamcinolone 1 x Per Day/30 Days Discharge Instructions: Apply Triamcinolone mix with calmine/zinc paste for itching Prim Dressing: Maxorb Extra Ag+ Alginate Dressing, 4x4.75 (in/in) 1 x Per Day/30 Days ary Discharge Instructions: Apply to wound bed as instructed Secondary Dressing: Zetuvit Plus Silicone Border Dressing 4x4 (in/in) 1 x Per Day/30 Days Discharge Instructions: Apply silicone border over primary dressing as directed. WOUND #2: - Gluteus Wound Laterality: Right Topical: Triamcinolone 1 x Per Day/30 Days Discharge Instructions: Apply Triamcinolone mix with calmine/zinc paste for itching Prim Dressing: Maxorb Extra Ag+ Alginate Dressing, 4x4.75 (in/in) 1 x Per Day/30 Days ary Discharge Instructions: Apply to wound bed as instructed Secondary Dressing: Zetuvit Plus Silicone  Border Dressing 4x4 (in/in) 1 x Per Day/30 Days Discharge Instructions: Apply silicone border over primary dressing as directed. 08/31/2023: This is a 77 year old well-controlled diabetic with hemiplegia related to a stroke, as well as parkinsonism. He has had issues with intermittent skin breakdown in his gluteal region for the past 2 years. On each side of his natal cleft, he has superficial abrasion-type injuries. The skin is otherwise in good condition. One of the wounds does go to the fat layer, but the rest are limited to breakdown of skin. No debridement was necessary today. He is on appropriate mattress at home and listening to his routine as described by his wife and caregiver, they are diligent about trying to avoid pressure on the site. They have been applying a mixture of calamine and zinc and I think that has led to some dryness and shear-type injury. We discussed the need for a good moisture balance to help avoid the skin becoming too dry or too wet. We will apply silver alginate to the open areas and cover this with a foam border dressing. I also suggested that they apply some triamcinolone or 1% hydrocortisone because he seems to itch and scratch at the area quite a bit. Based on clinic scheduling, they will follow-up in 2 to 3  weeks. Electronic Signature(s) Signed: 08/31/2023 12:44:09 PM By: Shawn Stall RN, BSN Signed: 08/31/2023 12:58:46 PM By: Duanne Guess MD FACS Previous Signature: 08/31/2023 10:31:56 AM Version By: Duanne Guess MD FACS Entered By: Shawn Stall on 08/31/2023 12:42:27 -------------------------------------------------------------------------------- HxROS Details Patient Name: Date of Service: Robert [name] 08/31/2023 9:00 A M Medical Record Number: [id number] Patient Account Number: [id number] Date of Birth/Sex: Treating RN: [birth year], Robert [name] (77 y.o. Robert [name] Primary Care Provider: DO Hardie Pulley Other Clinician: Referring Provider: Treating  Provider/Extender: Duanne Guess DO NNELLY, A NGELA Weeks in Treatment: 0 Information Obtained From Patient Caregiver Chart Constitutional Symptoms (General Health) Complaints and Symptoms: Positive for: Fever - low grade Eyes Complaints and Symptoms: Positive for: Glasses / Contacts Negative for: Dry Eyes; Vision Changes Medical History: Positive for: Cataracts - mild Negative for: Glaucoma; Optic Neuritis Ear/Nose/Mouth/Throat Robert [name], Robert [name] ([id number]) [id number] Page 7 of 9 Complaints and Symptoms: Negative for: Chronic sinus problems or rhinitis Medical History: Negative for: Chronic sinus problems/congestion; Middle ear problems Past Medical History Notes: hearing loss Respiratory Complaints and Symptoms: Negative for: Chronic or frequent coughs; Shortness of Breath Cardiovascular Complaints and Symptoms: Negative for: Chest pain Medical History: Positive for: Deep Vein Thrombosis; Hypertension Past Medical History Notes: carotid artery disease, embolic stroke, hyperlipidemia Gastrointestinal Complaints and Symptoms: Negative for: Frequent diarrhea; Nausea; Vomiting Medical History: Past Medical History Notes: hx gallstone pancreatitis Genitourinary Complaints and Symptoms: Negative for: Frequent urination Medical History: Negative for: End Stage Renal Disease Past Medical History Notes: foley cath, urinary retention BPH urethral stricture Integumentary (Skin) Complaints and Symptoms: Positive for: Wounds - gluteus Medical History: Negative for: History of Burn Musculoskeletal Complaints and Symptoms: Positive for: Muscle Weakness Negative for: Muscle Pain Neurologic Complaints and Symptoms: Positive for: Numbness/parasthesias Medical History: Past Medical History Notes: left hemiplegia, CVA x2 Psychiatric Complaints and Symptoms: Negative for: Claustrophobia Medical History: Negative for: Anorexia/bulimia;  Confinement Anxiety Hematologic/Lymphatic Endocrine Medical History: Positive for: Type II Diabetes Negative for: Type I Diabetes Time with diabetes: 2 yrs Treated with: Oral agents Robert [name], Robert [name] ([id number]) [phone].pdf Page 8 of 9 Blood sugar tested every day: Yes Tested : daily Immunological Oncologic Medical History: Negative for: Received Chemotherapy; Received Radiation HBO Extended History Items Eyes: Cataracts Immunizations Pneumococcal Vaccine: Received Pneumococcal Vaccination: Yes Received Pneumococcal Vaccination On or After 60th Birthday: Yes Implantable Devices None Family and Social History Unknown History: Yes; Former smoker - quit 40 yrs ago; Marital Status - Married; Alcohol Use: Never; Drug Use: No History; Caffeine Use: Daily - coffee; Financial Concerns: No; Food, Clothing or Shelter Needs: No; Support System Lacking: No; Transportation Concerns: No Electronic Signature(s) Signed: 08/31/2023 10:39:53 AM By: Duanne Guess MD FACS Signed: 08/31/2023 5:09:47 PM By: Zenaida Deed RN, BSN Entered By: Zenaida Deed on 08/31/2023 09:49:[name] -------------------------------------------------------------------------------- SuperBill Details Patient Name: Date of Service: Robert [name] 08/31/2023 Medical Record Number: [id number] Patient Account Number: [id number] Date of Birth/Sex: Treating RN: [birth year] (77 y.o. 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 of skin E11.622 Type 2 diabetes mellitus with other skin ulcer I69.354 Hemiplegia and hemiparesis following cerebral infarction affecting left non-dominant  side Facility Procedures : CPT4 Code: [id number] Description: 88416 - WOUND CARE VISIT-LEV 5 EST PT Modifier: Quantity: 1 Physician Procedures : CPT4 Code Description Modifier [id number]  16109 - WC PHYS LEVEL 4 - NEW PT ICD-10 Diagnosis Description L98.412 Non-pressure chronic ulcer of buttock with fat layer exposed L98.411 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

## 2023-09-01 NOTE — Progress Notes (Signed)
ANTIONNE, Robert [name] ([id number]) [id number] Page 1 of 4 Visit Report for 08/31/2023 Abuse Risk Screen Details Patient Name: Date of Service: Robert [name] 08/31/2023 9:00 Robert M Medical Record Number: [id number] Patient Account Number: [id number] Date of Birth/Sex: Treating RN: 07-Apr-1946 (77 y.o. Robert [name] Primary Care Early Steel: DO Robert [name] Other Clinician: Referring Robert [name]: Treating Robert [name]/Extender: Robert Guess DO Robert [name], Robert [name] Weeks in Treatment: 0 Abuse Risk Screen Items Answer ABUSE RISK SCREEN: Has anyone close to you tried to hurt or harm you recentlyo No Do you feel uncomfortable with anyone in your familyo No Has anyone forced you do things that you didnt want to doo No Electronic Signature(s) Signed: 08/31/2023 5:09:47 PM By: Robert Deed RN, BSN Entered By: Robert [name] on 08/31/2023 06:49:39 -------------------------------------------------------------------------------- Activities of Daily Living Details Patient Name: Date of Service: Robert [name] 08/31/2023 9:00 Robert M Medical Record Number: [id number] Patient Account Number: [id number] Date of Birth/Sex: Treating RN: [birth year] (77 y.o. Robert [name] Primary Care Lynn Recendiz: DO Robert [name] Other Clinician: Referring Jamarian Jacinto: Treating Robert [name]/Extender: Robert Guess DO Robert [name], Robert [name] Weeks in Treatment: 0 Activities of Daily Living Items Answer Activities of Daily Living (Please select one for each item) Drive Automobile Not Able T Medications ake Need Assistance Use T elephone Need Assistance Care for Appearance Need Assistance Use T oilet Need Assistance Bath / Shower Need Assistance Dress Self Need Assistance Feed Self Completely Able Walk Not Able Get In / Out Bed Need Assistance Housework Not Able Prepare Meals Not Able Handle Money Need Assistance Shop for Self Not Able Electronic Signature(s) Signed: 08/31/2023 5:09:47 PM  By: Robert Deed RN, BSN Entered By: Robert [name] on 08/31/2023 06:50:40 Layne Benton ([id number]) [phone] [id number] Page 2 of 4 -------------------------------------------------------------------------------- Education Screening Details Patient Name: Date of Service: Robert [name] 08/31/2023 9:00 Robert M Medical Record Number: [id number] Patient Account Number: [id number] Date of Birth/Sex: Treating RN: [birth year] (77 y.o. Robert [name] Primary Care Wannetta Langland: DO Robert [name] Other Clinician: Referring Aroush Chasse: Treating Robert [name]/Extender: Robert Guess DO Robert [name], Robert [name] Weeks in Treatment: 0 Primary Learner Assessed: Patient Learning Preferences/Education Level/Primary Language Learning Preference: Explanation, Video, Printed Material Highest Education Level: High School Preferred Language: English Cognitive Barrier Language Barrier: No Translator Needed: No Memory Deficit: No Emotional Barrier: No Cultural/Religious Beliefs Affecting Medical Care: No Physical Barrier Impaired Vision: Yes Glasses Impaired Hearing: Yes Hearing Aid, both ears Decreased Hand dexterity: Yes Limitations: left weakness Knowledge/Comprehension Knowledge Level: High Comprehension Level: High Ability to understand written instructions: High Ability to understand verbal instructions: High Motivation Anxiety Level: Calm Cooperation: Cooperative Education Importance: Acknowledges Need Interest in Health Problems: Asks Questions Perception: Coherent Willingness to Engage in Self-Management High Activities: Readiness to Engage in Self-Management High Activities: Electronic Signature(s) Signed: 08/31/2023 5:09:47 PM By: Robert Deed RN, BSN Entered By: Robert [name] on 08/31/2023 06:51:38 -------------------------------------------------------------------------------- Fall Risk Assessment Details Patient Name: Date of Service: Robert [name]  08/31/2023 9:00 Robert M Medical Record Number: [id number] Patient Account Number: [id number] Date of Birth/Sex: Treating RN: 11-30-1945 (77 y.o. Robert [name] Primary Care Robert [name]: DO Robert [name] Other Clinician: Referring Corita Allinson: Treating Dalisa Forrer/Extender: Robert Guess DO Robert [name], Robert [name] Weeks in Treatment: 0 Fall Risk Assessment Items Have you had 2 or more falls in the last 12 monthso 0 No Robert [name], Robert [name] ([id number]) [phone] [id number] Page 3 of 4 Have you had any fall that resulted in injury in the last 12 monthso 0 No FALLS RISK SCREEN History  of falling - immediate or within 3 months 0 No Secondary diagnosis (Do you have 2 or more medical diagnoseso) 15 Yes Ambulatory aid None/bed rest/wheelchair/nurse 0 Yes Crutches/cane/walker 0 No Furniture 0 No Intravenous therapy Access/Saline/Heparin Lock 0 No Gait/Transferring Normal/ bed rest/ wheelchair 0 Yes Weak (short steps with or without shuffle, stooped but able to lift head while walking, may seek 0 No support from furniture) Impaired (short steps with shuffle, may have difficulty arising from chair, head down, impaired 0 No balance) Mental Status Oriented to own ability 0 Yes Electronic Signature(s) Signed: 08/31/2023 5:09:47 PM By: Robert Deed RN, BSN Entered By: Robert [name] on 08/31/2023 06:51:57 -------------------------------------------------------------------------------- Foot Assessment Details Patient Name: Date of Service: Robert [name] 08/31/2023 9:00 Robert M Medical Record Number: [id number] Patient Account Number: [id number] Date of Birth/Sex: Treating RN: 01-28-1946 (77 y.o. Robert [name] Primary Care Robert [name]: DO Robert [name] Other Clinician: Referring Robert [name]: Treating Robert [name]/Extender: Robert Guess DO Robert [name], Robert [name] Weeks in Treatment: 0 Foot Assessment Items Site Locations + = Sensation present, - = Sensation absent, C = Callus, U = Ulcer R =  Redness, W = Warmth, M = Maceration, PU = Pre-ulcerative lesion F = Fissure, S = Swelling, D = Dryness Assessment Right: Left: Other Deformity: No No Prior Foot Ulcer: No No Prior Amputation: No No Charcot Joint: No No Ambulatory Status: Non-ambulatory Assistance Device: Wheelchair Robert [name], Robert [name] Bury ([id number]) [phone].pdf Page 4 of 4 Gait: Electronic Signature(s) Signed: 08/31/2023 5:09:47 PM By: Robert Deed RN, BSN Entered By: Robert [name] on 08/31/2023 06:52:39 -------------------------------------------------------------------------------- Nutrition Risk Screening Details Patient Name: Date of Service: Robert [name] 08/31/2023 9:00 Robert M Medical Record Number: [id number] Patient Account Number: [id number] Date of Birth/Sex: Treating RN: Oct 20, 1946 (77 y.o. Robert [name] Primary Care Gustavia Carie: DO Robert [name] Other Clinician: Referring Keoni Risinger: Treating Tinna Kolker/Extender: Robert Guess DO Robert [name], Robert [name] Weeks in Treatment: 0 Height (in): 75 Weight (lbs): 225 Body Mass Index (BMI): 28.1 Nutrition Risk Screening Items Score Screening NUTRITION RISK SCREEN: I have an illness or condition that made me change the kind and/or amount of food I eat 0 No I eat fewer than two meals per day 0 No I eat few fruits and vegetables, or milk products 0 No I have three or more drinks of beer, liquor or wine almost every day 0 No I have tooth or mouth problems that make it hard for me to eat 2 Yes I don't always have enough money to buy the food I need 0 No I eat alone most of the time 0 No I take three or more different prescribed or over-the-counter drugs Robert day 1 Yes Without wanting to, I have lost or gained 10 pounds in the last six months 0 No I am not always physically able to shop, cook and/or feed myself 0 No Nutrition Protocols Good Risk Protocol Provide education on elevated blood Moderate Risk Protocol 0 sugars and impact on  wound healing, as applicable High Risk Proctocol Risk Level: Moderate Risk Score: 3 Electronic Signature(s) Signed: 08/31/2023 5:09:47 PM By: Robert Deed RN, BSN Entered By: Robert [name] on 08/31/2023 06:52:26

## 2023-09-01 NOTE — Progress Notes (Signed)
[place], Robert [name] ([id number]) [id number] Page 1 of 9 Visit Report for 08/31/2023 Allergy List Details Patient Name: Date of Service: Robert [name] 08/31/2023 9:00 A M Medical Record Number: [id number] Patient Account Number: [id number] Date of Birth/Sex: Treating RN: May 16, 1946 (77 y.o. [name] Primary Care [place] Doster: DO Hardie Pulley Other Clinician: Referring Javarius Tsosie: Treating Keiona Jenison/Extender: Duanne Guess DO NNELLY, A NGELA Weeks in Treatment: 0 Allergies Active Allergies fluconazole Reaction: rash propofol Reaction: hiccups lisinopril Reaction: cough Sulfa (Sulfonamide Antibiotics) Reaction: diarrhea Allergy Notes Electronic Signature(s) Signed: 08/31/2023 5:09:47 PM By: Zenaida Deed RN, BSN Entered By: Zenaida Deed on 08/31/2023 09:28:01 -------------------------------------------------------------------------------- Arrival Information Details Patient Name: Date of Service: Robert [name] 08/31/2023 9:00 A M Medical Record Number: [id number] Patient Account Number: [id number] Date of Birth/Sex: Treating RN: [birth year] (77 y.o. M) Primary Care Ruthel Martine: DO Carolanne Grumbling, A NGELA Other Clinician: Referring Sameena Artus: Treating Ivanell Deshotel/Extender: Duanne Guess DO NNELLY, A NGELA Weeks in Treatment: 0 Visit Information Patient Arrived: Wheel Chair Arrival Time: 09:11 Accompanied By: wife Transfer Assistance: None Patient Identification Verified: Yes Secondary Verification Process Completed: Yes Patient Requires Transmission-Based Precautions: No Patient Has Alerts: No Electronic Signature(s) Signed: 08/31/2023 5:09:47 PM By: Zenaida Deed RN, BSN Entered By: Zenaida Deed on 08/31/2023 09:25:03 Layne Benton ([id number]) [id number] Page 2 of 9 -------------------------------------------------------------------------------- Clinic Level of Care Assessment Details Patient Name: Date of  Service: Robert [name] 08/31/2023 9:00 A M Medical Record Number: [id number] Patient Account Number: [id number] Date of Birth/Sex: Treating RN: Sep 04, 1946 (77 y.o. [name] Primary Care Delenn Ahn: DO Hardie Pulley Other Clinician: Referring Krislyn Donnan: Treating Malina Geers/Extender: Duanne Guess DO NNELLY, A NGELA Weeks in Treatment: 0 Clinic Level of Care Assessment Items TOOL 2 Quantity Score []  - 0 Use when only an EandM is performed on the INITIAL visit ASSESSMENTS - Nursing Assessment / Reassessment X- 1 20 General Physical Exam (combine w/ comprehensive assessment (listed just below) when performed on new pt. evals) X- 1 25 Comprehensive Assessment (HX, ROS, Risk Assessments, Wounds Hx, etc.) ASSESSMENTS - Wound and Skin A ssessment / Reassessment []  - 0 Simple Wound Assessment / Reassessment - one wound X- 2 5 Complex Wound Assessment / Reassessment - multiple wounds []  - 0 Dermatologic / Skin Assessment (not related to wound area) ASSESSMENTS - Ostomy and/or Continence Assessment and Care []  - 0 Incontinence Assessment and Management []  - 0 Ostomy Care Assessment and Management (repouching, etc.) PROCESS - Coordination of Care X - Simple Patient / Family Education for ongoing care 1 15 []  - 0 Complex (extensive) Patient / Family Education for ongoing care X- 1 10 Staff obtains Chiropractor, Records, T Results / Process Orders est X- 1 10 Staff telephones HHA, Nursing Homes / Clarify orders / etc []  - 0 Routine Transfer to another Facility (non-emergent condition) []  - 0 Routine Hospital Admission (non-emergent condition) X- 1 15 New Admissions / Manufacturing engineer / Ordering NPWT Apligraf, etc. , []  - 0 Emergency Hospital Admission (emergent condition) X- 1 10 Simple Discharge Coordination []  - 0 Complex (extensive) Discharge Coordination PROCESS - Special Needs []  - 0 Pediatric / Minor Patient Management []  - 0 Isolation Patient  Management []  - 0 Hearing / Language / Visual special needs []  - 0 Assessment of Community assistance (transportation, D/C planning, etc.) []  - 0 Additional assistance / Altered mentation []  - 0 Support Surface(s) Assessment (bed, cushion, seat, etc.) INTERVENTIONS - Wound Cleansing / Measurement X- 1 5 Wound Imaging (photographs - any number of wounds) []  -  0 Wound Tracing (instead of photographs) []  - 0 Simple Wound Measurement - one wound X- 2 5 Complex Wound Measurement - multiple wounds []  - 0 Simple Wound Cleansing - one wound [place], Robert [name] ([id number]) [id number] Page 3 of 9 X- 2 5 Complex Wound Cleansing - multiple wounds INTERVENTIONS - Wound Dressings X - Small Wound Dressing one or multiple wounds 1 10 []  - 0 Medium Wound Dressing one or multiple wounds []  - 0 Large Wound Dressing one or multiple wounds []  - 0 Application of Medications - injection INTERVENTIONS - Miscellaneous []  - 0 External ear exam []  - 0 Specimen Collection (cultures, biopsies, blood, body fluids, etc.) []  - 0 Specimen(s) / Culture(s) sent or taken to Lab for analysis X- 1 10 Patient Transfer (multiple staff / Michiel Sites Lift / Similar devices) []  - 0 Simple Staple / Suture removal (25 or less) []  - 0 Complex Staple / Suture removal (26 or more) []  - 0 Hypo / Hyperglycemic Management (close monitor of Blood Glucose) []  - 0 Ankle / Brachial Index (ABI) - do not check if billed separately Has the patient been seen at the hospital within the last three years: Yes Total Score: 160 Level Of Care: New/Established - Level 5 Electronic Signature(s) Signed: 08/31/2023 5:09:47 PM By: Zenaida Deed RN, BSN Entered By: Zenaida Deed on 08/31/2023 16:00:54 -------------------------------------------------------------------------------- Lower Extremity Assessment Details Patient Name: Date of Service: Robert [name] 08/31/2023 9:00 A M Medical Record Number:  [id number] Patient Account Number: [id number] Date of Birth/Sex: Treating RN: 1946/05/26 (77 y.o. [name] Primary Care Glenisha Gundry: DO Hardie Pulley Other Clinician: Referring Dilia Alemany: Treating Morad Tal/Extender: Duanne Guess DO NNELLY, A NGELA Weeks in Treatment: 0 Electronic Signature(s) Signed: 08/31/2023 5:09:47 PM By: Zenaida Deed RN, BSN Entered By: Zenaida Deed on 08/31/2023 09:52:47 -------------------------------------------------------------------------------- Multi Wound Chart Details Patient Name: Date of Service: Robert [name] 08/31/2023 9:00 A M Medical Record Number: [id number] Patient Account Number: [id number] Date of Birth/Sex: Treating RN: 11/30/1945 (77 y.o. M) Primary Care Niko Penson: DO NNELLY, A NGELA Other Clinician: Referring Odas Ozer: Treating Drelyn Pistilli/Extender: Duanne Guess DO NNELLY, A NGELA Weeks in Treatment: 0 Vital Signs Height(in): 75 Capillary Blood Glucose(mg/dl): 644 Weight(lbs): 034 Pulse(bpm): 98 [place], Truth ([id number]) [id number] Page 4 of 9 Body Mass Index(BMI): 28.1 Blood Pressure(mmHg): 125/72 Temperature(F): 98.9 Respiratory Rate(breaths/min): 18 [1:Photos:] [N/A:N/A] Left Gluteus Right Gluteus N/A Wound Location: Gradually Appeared Gradually Appeared N/A Wounding Event: Abrasion Abrasion N/A Primary Etiology: Cataracts, Deep Vein Thrombosis, Cataracts, Deep Vein Thrombosis, N/A Comorbid History: Hypertension, Type II Diabetes Hypertension, Type II Diabetes 08/26/2021 08/26/2021 N/A Date Acquired: 0 0 N/A Weeks of Treatment: Open Open N/A Wound Status: No No N/A Wound Recurrence: 0.5x0.4x0.1 1.1x0.9x0.1 N/A Measurements L x W x D (cm) 0.157 0.778 N/A A (cm) : rea 0.016 0.078 N/A Volume (cm) : Partial Thickness Full Thickness Without Exposed N/A Classification: Support Structures Medium Medium N/A Exudate Amount: Serosanguineous Serosanguineous N/A Exudate  Type: red, brown red, brown N/A Exudate Color: Flat and Intact Flat and Intact N/A Wound Margin: Large (67-100%) Large (67-100%) N/A Granulation Amount: Red Red N/A Granulation Quality: None Present (0%) None Present (0%) N/A Necrotic Amount: Fascia: No Fat Layer (Subcutaneous Tissue): Yes N/A Exposed Structures: Fat Layer (Subcutaneous Tissue): No Fascia: No Tendon: No Tendon: No Muscle: No Muscle: No Joint: No Joint: No Bone: No Bone: No Medium (34-66%) Small (1-33%) N/A Epithelialization: No Abnormalities Noted No Abnormalities Noted N/A Periwound Skin Texture: No Abnormalities Noted No Abnormalities Noted N/A Periwound Skin Moisture:  No Abnormalities Noted No Abnormalities Noted N/A Periwound Skin Color: No Abnormality No Abnormality N/A Temperature: Treatment Notes Electronic Signature(s) Signed: 08/31/2023 10:21:30 AM By: Duanne Guess MD FACS Entered By: Duanne Guess on 08/31/2023 10:21:30 -------------------------------------------------------------------------------- Multi-Disciplinary Care Plan Details Patient Name: Date of Service: Robert [name] 08/31/2023 9:00 A M Medical Record Number: [id number] Patient Account Number: [id number] Date of Birth/Sex: Treating RN: 1946-05-30 (77 y.o. [name] Primary Care Daundre Biel: DO Hardie Pulley Other Clinician: Referring Nael Petrosyan: Treating Dajohn Ellender/Extender: Duanne Guess DO NNELLY, A NGELA Weeks in Treatment: 0 Multidisciplinary Care Plan reviewed with physician [address] [place], Robert [name] ([id number]) [id number] Page 5 of 9 Abuse / Safety / Falls / Self Care Management Nursing Diagnoses: Impaired physical mobility Potential for falls Goals: Patient/caregiver will verbalize/demonstrate measures taken to prevent injury and/or falls Date Initiated: 08/31/2023 Target Resolution Date: 09/28/2023 Goal Status: Active Interventions: Assess fall risk on admission  and as needed Assess: immobility, friction, shearing, incontinence upon admission and as needed Assess self care needs on admission and as needed Notes: Wound/Skin Impairment Nursing Diagnoses: Impaired tissue integrity Knowledge deficit related to ulceration/compromised skin integrity Goals: Patient/caregiver will verbalize understanding of skin care regimen Date Initiated: 08/31/2023 Target Resolution Date: 09/28/2023 Goal Status: Active Ulcer/skin breakdown will have a volume reduction of 30% by week 4 Date Initiated: 08/31/2023 Target Resolution Date: 09/28/2023 Goal Status: Active Interventions: Assess patient/caregiver ability to obtain necessary supplies Assess patient/caregiver ability to perform ulcer/skin care regimen upon admission and as needed Assess ulceration(s) every visit Provide education on ulcer and skin care Treatment Activities: Skin care regimen initiated : 08/31/2023 Topical wound management initiated : 08/31/2023 Notes: Electronic Signature(s) Signed: 08/31/2023 5:09:47 PM By: Zenaida Deed RN, BSN Entered By: Zenaida Deed on 08/31/2023 15:59:27 -------------------------------------------------------------------------------- Pain Assessment Details Patient Name: Date of Service: Robert [name] 08/31/2023 9:00 A M Medical Record Number: [id number] Patient Account Number: [id number] Date of Birth/Sex: Treating RN: 1946-09-29 (77 y.o. [name] Primary Care Benjimin Hadden: DO Hardie Pulley Other Clinician: Referring Malikhi Ogan: Treating Nixxon Faria/Extender: Duanne Guess DO NNELLY, A NGELA Weeks in Treatment: 0 Active Problems Location of Pain Severity and Description of Pain Patient Has Paino Yes Site Locations Pain LocationTORRYN, HUDSPETH ([id number]) [id number] Page 6 of 9 Pain Location: Pain in Ulcers With Dressing Change: Yes Duration of the Pain. Constant / Intermittento Intermittent Rate the pain. Current  Pain Level: 0 Worst Pain Level: 5 Least Pain Level: 0 Character of Pain Describe the Pain: Aching, Other: uncomfortable Pain Management and Medication Current Pain Management: Is the Current Pain Management Adequate: Adequate How does your wound impact your activities of daily livingo Sleep: No Bathing: No Appetite: No Relationship With Others: No Bladder Continence: No Emotions: No Bowel Continence: No Work: No Toileting: No Drive: No Dressing: No Hobbies: No Electronic Signature(s) Signed: 08/31/2023 5:09:47 PM By: Zenaida Deed RN, BSN Entered By: Zenaida Deed on 08/31/2023 09:53:49 -------------------------------------------------------------------------------- Patient/Caregiver Education Details Patient Name: Date of Service: Robert [name] 11/5/2024andnbsp9:00 A M Medical Record Number: [id number] Patient Account Number: [id number] Date of Birth/Gender: Treating RN: [birth year] (77 y.o. [name] Primary Care Physician: DO Hardie Pulley Other Clinician: Referring Physician: Treating Physician/Extender: Duanne Guess DO NNELLY, A NGELA Weeks in Treatment: 0 Education Assessment Education Provided To: Patient Education Topics Provided Pressure: Methods: Explain/Verbal Responses: Reinforcements needed, State content correctly Venous: Methods: Explain/Verbal Responses: Reinforcements needed, State content correctly Wound/Skin Impairment: Methods: Explain/Verbal Responses: Reinforcements needed, State content correctly [place], Robert [name] ([id number]) [id number] Page 7 of 9  Electronic Signature(s) Signed: 08/31/2023 5:09:47 PM By: Zenaida Deed RN, BSN Entered By: Zenaida Deed on 08/31/2023 15:59:57 -------------------------------------------------------------------------------- Wound Assessment Details Patient Name: Date of Service: Robert [name] 08/31/2023 9:00 A M Medical Record Number: [id number] Patient  Account Number: [id number] Date of Birth/Sex: Treating RN: December 18, 1945 (77 y.o. [name] Primary Care Oney Tatlock: DO Hardie Pulley Other Clinician: Referring Aalijah Lanphere: Treating Kim Oki/Extender: Duanne Guess DO NNELLY, A NGELA Weeks in Treatment: 0 Wound Status Wound Number: 1 Primary Abrasion Etiology: Wound Location: Left Gluteus Wound Status: Open Wounding Event: Gradually Appeared Comorbid Cataracts, Deep Vein Thrombosis, Hypertension, Type II Date Acquired: 08/26/2021 History: Diabetes Weeks Of Treatment: 0 Clustered Wound: No Photos Wound Measurements Length: (cm) 0.5 Width: (cm) 0.4 Depth: (cm) 0.1 Area: (cm) 0.157 Volume: (cm) 0.016 % Reduction in Area: % Reduction in Volume: Epithelialization: Medium (34-66%) Tunneling: No Undermining: No Wound Description Classification: Partial Thickness Wound Margin: Flat and Intact Exudate Amount: Medium Exudate Type: Serosanguineous Exudate Color: red, brown Foul Odor After Cleansing: No Slough/Fibrino No Wound Bed Granulation Amount: Large (67-100%) Exposed Structure Granulation Quality: Red Fascia Exposed: No Necrotic Amount: None Present (0%) Fat Layer (Subcutaneous Tissue) Exposed: No Tendon Exposed: No Muscle Exposed: No Joint Exposed: No Bone Exposed: No Periwound Skin Texture Texture Color No Abnormalities Noted: Yes No Abnormalities Noted: Yes Moisture Temperature / Pain No Abnormalities Noted: Yes Temperature: No Abnormality Odekirk, Estus ([id number]) [id number] Page 8 of 9 Electronic Signature(s) Signed: 08/31/2023 5:09:47 PM By: Zenaida Deed RN, BSN Entered By: Zenaida Deed on 08/31/2023 10:15:09 -------------------------------------------------------------------------------- Wound Assessment Details Patient Name: Date of Service: Robert [name] 08/31/2023 9:00 A M Medical Record Number: [id number] Patient Account Number: [id number] Date of  Birth/Sex: Treating RN: September 02, 1946 (77 y.o. [name] Primary Care Ahlia Lemanski: DO Hardie Pulley Other Clinician: Referring Magali Bray: Treating Vivan Agostino/Extender: Duanne Guess DO NNELLY, A NGELA Weeks in Treatment: 0 Wound Status Wound Number: 2 Primary Abrasion Etiology: Wound Location: Right Gluteus Wound Status: Open Wounding Event: Gradually Appeared Comorbid Cataracts, Deep Vein Thrombosis, Hypertension, Type II Date Acquired: 08/26/2021 History: Diabetes Weeks Of Treatment: 0 Clustered Wound: No Photos Wound Measurements Length: (cm) 1.1 Width: (cm) 0.9 Depth: (cm) 0.1 Area: (cm) 0.778 Volume: (cm) 0.078 % Reduction in Area: % Reduction in Volume: Epithelialization: Small (1-33%) Tunneling: No Wound Description Classification: Full Thickness Without Exposed Support Wound Margin: Flat and Intact Exudate Amount: Medium Exudate Type: Serosanguineous Exudate Color: red, brown Structures Foul Odor After Cleansing: No Slough/Fibrino No Wound Bed Granulation Amount: Large (67-100%) Exposed Structure Granulation Quality: Red Fascia Exposed: No Necrotic Amount: None Present (0%) Fat Layer (Subcutaneous Tissue) Exposed: Yes Tendon Exposed: No Muscle Exposed: No Joint Exposed: No Bone Exposed: No Periwound Skin Texture Texture Color No Abnormalities Noted: Yes No Abnormalities Noted: Yes Moisture Temperature / Pain No Abnormalities Noted: Yes Temperature: No Abnormality Rueger, Reyaansh ([id number]) [id number] Page 9 of 9 Electronic Signature(s) Signed: 08/31/2023 5:09:47 PM By: Zenaida Deed RN, BSN Entered By: Zenaida Deed on 08/31/2023 10:15:54 -------------------------------------------------------------------------------- Vitals Details Patient Name: Date of Service: Robert [name] 08/31/2023 9:00 A M Medical Record Number: [id number] Patient Account Number: [id number] Date of Birth/Sex: Treating RN: 03/18/1946 (77  y.o. M) Primary Care Lakyra Tippins: DO NNELLY, A NGELA Other Clinician: Referring Jaysa Kise: Treating Roniqua Kintz/Extender: Duanne Guess DO NNELLY, A NGELA Weeks in Treatment: 0 Vital Signs Time Taken: 09:12 Temperature (F): 98.9 Height (in): 75 Pulse (bpm): 98 Weight (lbs): 225 Respiratory Rate (breaths/min): 18 Body Mass Index (BMI): 28.1 Blood Pressure (mmHg): 125/72 Capillary  Blood Glucose (mg/dl): 403 Reference Range: 80 - 120 mg / dl Notes glucose per pt report this am Electronic Signature(s) Signed: 08/31/2023 5:09:47 PM By: Zenaida Deed RN, BSN Entered By: Zenaida Deed on 08/31/2023 09:25:52

## 2023-09-15 ENCOUNTER — Emergency Department (HOSPITAL_COMMUNITY): Payer: Non-veteran care

## 2023-09-15 ENCOUNTER — Emergency Department (HOSPITAL_COMMUNITY)
Admission: EM | Admit: 2023-09-15 | Discharge: 2023-09-15 | Disposition: A | Discharge status: Home / Self Care | Payer: No Typology Code available for payment source | Attending: Emergency Medicine | Admitting: Emergency Medicine

## 2023-09-15 ENCOUNTER — Emergency Department (HOSPITAL_COMMUNITY): Payer: No Typology Code available for payment source

## 2023-09-15 ENCOUNTER — Encounter (HOSPITAL_COMMUNITY): Payer: Self-pay

## 2023-09-15 ENCOUNTER — Other Ambulatory Visit: Payer: Self-pay

## 2023-09-15 DIAGNOSIS — R319 Hematuria, unspecified: Secondary | ICD-10-CM | POA: Diagnosis not present

## 2023-09-15 DIAGNOSIS — R791 Abnormal coagulation profile: Secondary | ICD-10-CM | POA: Diagnosis not present

## 2023-09-15 DIAGNOSIS — K5730 Diverticulosis of large intestine without perforation or abscess without bleeding: Secondary | ICD-10-CM | POA: Diagnosis not present

## 2023-09-15 DIAGNOSIS — Z7901 Long term (current) use of anticoagulants: Secondary | ICD-10-CM | POA: Insufficient documentation

## 2023-09-15 DIAGNOSIS — N3090 Cystitis, unspecified without hematuria: Secondary | ICD-10-CM

## 2023-09-15 DIAGNOSIS — Z7982 Long term (current) use of aspirin: Secondary | ICD-10-CM | POA: Insufficient documentation

## 2023-09-15 DIAGNOSIS — R58 Hemorrhage, not elsewhere classified: Secondary | ICD-10-CM | POA: Diagnosis not present

## 2023-09-15 DIAGNOSIS — Z7984 Long term (current) use of oral hypoglycemic drugs: Secondary | ICD-10-CM | POA: Diagnosis not present

## 2023-09-15 DIAGNOSIS — A419 Sepsis, unspecified organism: Secondary | ICD-10-CM | POA: Diagnosis not present

## 2023-09-15 DIAGNOSIS — N3091 Cystitis, unspecified with hematuria: Secondary | ICD-10-CM | POA: Diagnosis not present

## 2023-09-15 LAB — I-STAT CG4 LACTIC ACID, ED: Lactic Acid, Venous: 1.3 mmol/L (ref 0.5–1.9)

## 2023-09-15 LAB — URINALYSIS, W/ REFLEX TO CULTURE (INFECTION SUSPECTED)
Bilirubin Urine: NEGATIVE
Glucose, UA: NEGATIVE mg/dL
Ketones, ur: 5 mg/dL — AB
Nitrite: POSITIVE — AB
Protein, ur: 30 mg/dL — AB
RBC / HPF: >50 RBC/hpf (ref 0–5)
Specific Gravity, Urine: 1.018 (ref 1.005–1.030)
WBC, UA: >50 WBC/hpf (ref 0–5)
pH: 6 (ref 5.0–8.0)

## 2023-09-15 LAB — CBC WITH DIFFERENTIAL/PLATELET
Abs Immature Granulocytes: 0.02 10*3/uL (ref 0.00–0.07)
Basophils Absolute: 0.1 10*3/uL (ref 0.0–0.1)
Basophils Relative: 1 %
Eosinophils Absolute: 0.3 10*3/uL (ref 0.0–0.5)
Eosinophils Relative: 4 %
HCT: 45.6 % (ref 39.0–52.0)
Hemoglobin: 14.5 g/dL (ref 13.0–17.0)
Immature Granulocytes: 0 %
Lymphocytes Relative: 18 %
Lymphs Abs: 1.2 10*3/uL (ref 0.7–4.0)
MCH: 30.3 pg (ref 26.0–34.0)
MCHC: 31.8 g/dL (ref 30.0–36.0)
MCV: 95.2 fL (ref 80.0–100.0)
Monocytes Absolute: 0.9 10*3/uL (ref 0.1–1.0)
Monocytes Relative: 14 %
Neutro Abs: 4.2 10*3/uL (ref 1.7–7.7)
Neutrophils Relative %: 63 %
Platelets: 350 10*3/uL (ref 150–400)
RBC: 4.79 MIL/uL (ref 4.22–5.81)
RDW: 13.7 % (ref 11.5–15.5)
WBC: 6.7 10*3/uL (ref 4.0–10.5)
nRBC: 0 % (ref 0.0–0.2)

## 2023-09-15 LAB — COMPREHENSIVE METABOLIC PANEL
ALT: 6 U/L (ref 0–44)
AST: 17 U/L (ref 15–41)
Albumin: 3.6 g/dL (ref 3.5–5.0)
Alkaline Phosphatase: 63 U/L (ref 38–126)
Anion gap: 8 (ref 5–15)
BUN: 12 mg/dL (ref 8–23)
CO2: 26 mmol/L (ref 22–32)
Calcium: 8.9 mg/dL (ref 8.9–10.3)
Chloride: 102 mmol/L (ref 98–111)
Creatinine, Ser: 0.95 mg/dL (ref 0.61–1.24)
GFR, Estimated: >60 mL/min (ref >60)
Glucose, Bld: 141 mg/dL — ABNORMAL HIGH (ref 70–99)
Potassium: 4 mmol/L (ref 3.5–5.1)
Sodium: 136 mmol/L (ref 135–145)
Total Bilirubin: 0.8 mg/dL (ref <1.2)
Total Protein: 6.9 g/dL (ref 6.5–8.1)

## 2023-09-15 LAB — PROTIME-INR
INR: 1.3 — ABNORMAL HIGH (ref 0.8–1.2)
Prothrombin Time: 16.2 s — ABNORMAL HIGH (ref 11.4–15.2)

## 2023-09-15 LAB — APTT: aPTT: 31 s (ref 24–36)

## 2023-09-15 MED ORDER — SODIUM CHLORIDE 0.9 % IV SOLN
2.0000 g | Freq: Once | INTRAVENOUS | Status: AC
  Administered 2023-09-15: 2 g via INTRAVENOUS
  Filled 2023-09-15: qty 20

## 2023-09-15 MED ORDER — CEFPODOXIME PROXETIL 100 MG PO TABS: 100.0000 mg | ORAL_TABLET | Freq: Two times a day (BID) | ORAL | 0 refills | Status: AC

## 2023-09-15 NOTE — ED Triage Notes (Signed)
Patient brought in by EMS due to blood in urine X 1 week. Pt has catheter in place. Pt currently undergoing treatment for UTI. Catheter bag has yellow urine in it at this time.

## 2023-09-15 NOTE — ED Provider Notes (Signed)
[place] EMERGENCY DEPARTMENT AT [facility] Provider Note   CSN: [id number] Arrival date & time: 09/15/23  1031     History  Chief Complaint  Patient presents with   Hematuria    Robert [name] is a 77 y.o. male.  37 yoM with a chief complaint of hematuria.  The patient is on Eliquis and has a Foley catheter.  He lives in a facility.  Had his catheter replaced about a week ago.  Had a small amount of bleeding postplacement which is not uncommon but the family said he is never had bleeding after that.  Had some bleeding last night that is resolved.  Family is concerned that he is also been having low-grade fevers at home.  Highest of 99.  They called their primary doctor at the [facility] and they encouraged them to come to the ED for a sepsis evaluation.  They deny cough congestion or fever denies abdominal pain.  He has a wound on his buttocks but they feel it is getting better and states there is no break to the skin.   Hematuria       Home Medications Prior to Admission medications   Medication Sig Start Date End Date Taking? Authorizing Provider  cefpodoxime (VANTIN) 100 MG tablet Take 1 tablet (100 mg total) by mouth 2 (two) times daily for 7 days. 09/15/23 09/22/23 Yes Melene Plan, DO  Acidophilus Lactobacillus CAPS See admin instructions.    [provider]  ACIDOPHILUS LACTOBACILLUS PO Take 1 tablet by mouth at bedtime. 02/03/22   [provider]  apixaban (ELIQUIS) 5 MG TABS tablet Take 1 tablet (5 mg total) by mouth 2 (two) times daily. 05/27/21   Love, Evlyn Kanner, PA-C  ascorbic acid (VITAMIN C) 500 MG tablet TAKE ONE TABLET BY MOUTH DAILY FOR PREVENTION OF UTI 02/17/22   [provider]  aspirin EC 81 MG tablet Take 81 mg by mouth daily. Swallow whole.    [provider]  benzonatate (TESSALON) 100 MG capsule Take 100 mg by mouth 3 (three) times daily as needed for cough. Patient not taking: Reported on 03/16/2023 10/01/21    [provider]  Blood Glucose Monitoring Suppl (ONETOUCH VERIO FLEX SYSTEM) w/Device KIT check blood sugar 11/25/20   [provider]  Carbidopa-Levodopa ER (SINEMET CR) 25-100 MG tablet controlled release Take 2 1/2 tablets by mouth twice a day and take 1 tablet once a day with supper - taken at 0900, 1300, 1700 12/18/21   [provider]  cephALEXin (KEFLEX) 500 MG capsule TAKE ONE CAPSULE BY MOUTH TWICE A DAY FOR UTI 03/13/22   [provider]  Cholecalciferol (VITAMIN D3) 50 MCG (2000 UT) TABS 1 tablet    [provider]  Cholecalciferol 50 MCG (2000 UT) TABS Take 2,000 Units by mouth daily. 12/23/21   [provider]  feeding supplement (ENSURE ENLIVE / ENSURE PLUS) LIQD Take 237 mLs by mouth 2 (two) times daily between meals. 02/11/22   Rolly Salter, MD  finasteride (PROSCAR) 5 MG tablet Take 5 mg by mouth at bedtime. 01/07/22   [provider]  Fluocinolone Acetonide Body 0.01 % OIL Apply 1 application. topically See admin instructions. Apply thin film to the scalp 1-2 times daily as needed for itching 11/06/21   [provider]  hydrOXYzine (ATARAX) 10 MG tablet Take 10 mg by mouth 3 (three) times daily as needed.    [provider]  memantine (NAMENDA) 10 MG tablet Take 20  mg by mouth at bedtime.    [provider]  metFORMIN (GLUCOPHAGE) 500 MG tablet Take 500 mg by mouth at bedtime.    [provider]  methenamine (HIPREX) 1 g tablet Take 1 g by mouth 2 (two) times daily. for infection prevention 02/03/22   [provider]  methenamine (HIPREX) 1 g tablet 1 tablet    [provider]  nystatin-triamcinolone ointment (MYCOLOG) Apply 1 application. topically See admin instructions. Apply thin film to groin and leg twice a day 10/26/21   [provider]  omeprazole (PRILOSEC) 20 MG capsule Take 20 mg by mouth daily.    [provider]  ondansetron (ZOFRAN-ODT) 4 MG  disintegrating tablet Take 1 tablet (4 mg total) by mouth every 8 (eight) hours as needed for nausea or vomiting. Patient not taking: Reported on 03/16/2023 11/14/22   Renne Crigler, PA-C  Pimavanserin Tartrate (NUPLAZID) 34 MG CAPS Take 34 mg by mouth daily.    [provider]  polyethylene glycol (MIRALAX / GLYCOLAX) 17 g packet Take 17 g by mouth daily as needed for mild constipation. Patient not taking: Reported on 03/16/2023 05/12/17   [provider]  rasagiline (AZILECT) 1 MG TABS tablet Take 1 mg by mouth daily.    [provider]  simvastatin (ZOCOR) 40 MG tablet Take 40 mg by mouth at bedtime. 09/05/15   [provider]  tacrolimus (PROTOPIC) 0.1 % ointment Apply 1 application. topically See admin instructions. Apply to groin, armpit, and bottom when skin is broken out twice a day as needed 11/06/21   [provider]      Allergies    Fluconazole, Propofol, Lisinopril, and Sulfamethoxazole-trimethoprim    Review of Systems   Review of Systems  Genitourinary:  Positive for hematuria.    Physical Exam Updated Vital Signs BP (!) 156/97   Pulse 75   Temp 98.8 F (37.1 C) (Oral)   Resp (!) 21   Ht 6\' 2"  (1.88 m)   Wt 99.8 kg   SpO2 100%   BMI 28.25 kg/m  Physical Exam Vitals and nursing note reviewed.  Constitutional:      Appearance: He is well-developed.  HENT:     Head: Normocephalic and atraumatic.  Eyes:     Pupils: Pupils are equal, round, and reactive to light.  Neck:     Vascular: No JVD.  Cardiovascular:     Rate and Rhythm: Normal rate and regular rhythm.     Heart sounds: No murmur heard.    No friction rub. No gallop.  Pulmonary:     Effort: No respiratory distress.     Breath sounds: No wheezing.  Abdominal:     General: There is no distension.     Tenderness: There is no abdominal tenderness. There is no guarding or rebound.  Musculoskeletal:        General: Normal range of motion.     Cervical back:  Normal range of motion and neck supple.  Skin:    Coloration: Skin is not pale.     Findings: No rash.  Neurological:     Mental Status: He is alert and oriented to person, place, and time.  Psychiatric:        Behavior: Behavior normal.     ED Results / Procedures / Treatments   Labs (all labs ordered are listed, but only abnormal results are displayed) Labs Reviewed  COMPREHENSIVE METABOLIC PANEL - Abnormal; Notable for the following components:  Result Value   Glucose, Bld 141 (*)    All other components within normal limits  PROTIME-INR - Abnormal; Notable for the following components:   Prothrombin Time 16.2 (*)    INR 1.3 (*)    All other components within normal limits  URINALYSIS, W/ REFLEX TO CULTURE (INFECTION SUSPECTED) - Abnormal; Notable for the following components:   APPearance CLOUDY (*)    Hgb urine dipstick LARGE (*)    Ketones, ur 5 (*)    Protein, ur 30 (*)    Nitrite POSITIVE (*)    Leukocytes,Ua LARGE (*)    Bacteria, UA RARE (*)    All other components within normal limits  CULTURE, BLOOD (ROUTINE X 2)  CULTURE, BLOOD (ROUTINE X 2)  URINE CULTURE  CBC WITH DIFFERENTIAL/PLATELET  APTT  I-STAT CG4 LACTIC ACID, ED  I-STAT CG4 LACTIC ACID, ED    EKG EKG Interpretation Date/Time:  Wednesday September 15 2023 12:12:53 EST Ventricular Rate:  75 PR Interval:  180 QRS Duration:  103 QT Interval:  389 QTC Calculation: 435 R Axis:   -8  Text Interpretation: Sinus rhythm Abnormal R-wave progression, early transition No significant change since last tracing Confirmed by Melene Plan [phone]) on 09/15/2023 1:36:58 PM  Radiology DG Chest Port 1 View  Result Date: 09/15/2023 CLINICAL DATA:  Hematuria.  Sepsis. EXAM: PORTABLE CHEST 1 VIEW COMPARISON:  Chest radiograph dated 11/14/2022 FINDINGS: Normal lung volumes. No focal consolidations. No pleural effusion or pneumothorax. The heart size and mediastinal contours are within normal limits. No acute  osseous abnormality. Right upper quadrant surgical clips. IMPRESSION: No active disease. Electronically Signed   By: Agustin Cree M.D.   On: 09/15/2023 15:41   CT Renal Stone Study  Result Date: 09/15/2023 CLINICAL DATA:  One-week history of hematuria. Catheter in-situ. Currently being treated for urinary tract infection. EXAM: CT ABDOMEN AND PELVIS WITHOUT CONTRAST TECHNIQUE: Multidetector CT imaging of the abdomen and pelvis was performed following the standard protocol without IV contrast. RADIATION DOSE REDUCTION: This exam was performed according to the departmental dose-optimization program which includes automated exposure control, adjustment of the mA and/or kV according to patient size and/or use of iterative reconstruction technique. COMPARISON:  CT abdomen and pelvis dated 01/04/2021 FINDINGS: Lower chest: No focal consolidation or pulmonary nodule in the lung bases. No pleural effusion or pneumothorax demonstrated. Partially imaged heart size is normal. Coronary artery calcifications. Hepatobiliary: No focal hepatic lesions. No intra or extrahepatic biliary ductal dilation. Cholecystectomy. Pancreas: Diffusely atrophic pancreas. Spleen: Normal in size without focal abnormality. Adrenals/Urinary Tract: No adrenal nodules. No suspicious renal mass on this noncontrast enhanced examination, calculi or hydronephrosis. Underdistended urinary bladder with circumferential mural thickening and moderate diffuse circumferential pericystic stranding. Multiple bladder diverticula. Intraluminal gas within the urinary bladder and bladder diverticula with catheter in-situ and tip terminating within a right bladder diverticulum. Layering stones within the bladder diverticula. Stomach/Bowel: Normal appearance of the stomach. No evidence of bowel wall thickening, distention, or inflammatory changes. Colonic diverticulosis without acute diverticulitis. Normal appendix. Vascular/Lymphatic: Aortic atherosclerosis. IVC  filter is located infrarenally with tines extending outside of the lumen of the IVC and tip and hook extending into the adjacent proximal duodenum. No enlarged abdominal or pelvic lymph nodes. Multiple prominent subcentimeter retroaortic lymph nodes. Reproductive: Prostate brachytherapy seeds. Small bilateral hydroceles. Other: No free fluid, fluid collection, or free air. Musculoskeletal: No acute or abnormal lytic or blastic osseous lesions. Multilevel degenerative changes of the partially imaged thoracic and lumbar spine. Small fat-containing right  inguinal hernia. IMPRESSION: 1. Underdistended urinary bladder with circumferential mural thickening and moderate diffuse circumferential pericystic stranding, consistent with cystitis. 2. Intraluminal gas within the urinary bladder and bladder diverticula with catheter in-situ and tip terminating within a right bladder diverticulum. Layering stones within the bladder diverticula. 3. IVC filter is located infrarenally with tines extending outside of the lumen of the IVC and tip and hook extending into the adjacent proximal duodenum. 4. Colonic diverticulosis without acute diverticulitis. 5. Aortic Atherosclerosis (ICD10-I70.0). Coronary artery calcifications. Assessment for potential risk factor modification, dietary therapy or pharmacologic therapy may be warranted, if clinically indicated. Electronically Signed   By: Agustin Cree M.D.   On: 09/15/2023 15:40    Procedures Procedures    Medications Ordered in ED Medications  cefTRIAXone (ROCEPHIN) 2 g in sodium chloride 0.9 % 100 mL IVPB (has no administration in time range)    ED Course/ Medical Decision Making/ A&P                                 Medical Decision Making Amount and/or Complexity of Data Reviewed Labs: ordered. Radiology: ordered. ECG/medicine tests: ordered.  Risk Prescription drug management.   77 yo M with a chief complaint of hematuria.  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  On exam he seems mostly immobile.  His wife provides most of the history.  Tells me that he had some blood in his urine last night.  Seems to improve resolved this morning.  Has had temperatures at home as high as 99.5.  After some discussion with their doctor at the [place] they encouraged them to come here to be evaluated for sepsis with prolonged elevated temperatures not resolving with multiple rounds of oral antibiotics for presumed urinary tract infection.  Patient appears chronically ill.  Seems in no distress.  He has no abdominal discomfort.  Will obtain laboratory evaluation.  Blood cultures.  UA and chest x-ray.  Reassess.  UA without obvious infection, it was nitrite positive but few bacteria.  I wonder if this is due to him being chronically on antibiotics, the wife has found the antibiotics that he has been on it seems that he is on Bactrim chronically and he had recently been on Macrobid.  Just finished 48 hours ago per the wife.  CT renal study without obvious stone, no obvious kidney pathology.  The bladder does look possibly irritated.  I discussed this with the patient and family.  Will trial oral antibiotics.  I reviewed the patient's medical records and he has the cultures and sensitivities report from urine this past week.  Was resistant to first generation cephalosporins, ciprofloxacin.  I did give a dose of Rocephin here.  Started on [place].  4:27 PM:  I have discussed the diagnosis/risks/treatment options with the patient and family.  Evaluation and diagnostic testing in the emergency department does not suggest an emergent condition requiring admission or immediate intervention beyond what has been performed at this time.  They will follow up with PCP, ID. We also discussed returning to the ED immediately if new or worsening sx occur. We discussed the sx which are most concerning (e.g., sudden worsening pain,  fever, inability to tolerate by mouth) that necessitate immediate return. Medications administered to the patient during their visit and any new prescriptions provided to the patient are listed below.  Medications given during  this visit Medications  cefTRIAXone (ROCEPHIN) 2 g in sodium chloride 0.9 % 100 mL IVPB (has no administration in time range)     The patient appears reasonably screen and/or stabilized for discharge and I doubt any other medical condition or other [facility] requiring further screening, evaluation, or treatment in the ED at this time prior to discharge.          Final Clinical Impression(s) / ED Diagnoses Final diagnoses:  Cystitis    Rx / DC Orders ED Discharge Orders          Ordered    cefpodoxime (VANTIN) 100 MG tablet  2 times daily        09/15/23 1621              Melene Plan, DO 09/15/23 1627

## 2023-09-15 NOTE — Discharge Instructions (Signed)
I would call your doctor at the [facility] tomorrow and let them know about your visit here.  See what they think of the antibiotics that we started him on.  They may want to change it up.

## 2023-09-16 ENCOUNTER — Ambulatory Visit (HOSPITAL_BASED_OUTPATIENT_CLINIC_OR_DEPARTMENT_OTHER): Payer: No Typology Code available for payment source | Admitting: General Surgery

## 2023-09-17 LAB — URINE CULTURE: Culture: >=100000 — AB

## 2023-09-18 ENCOUNTER — Telehealth (HOSPITAL_BASED_OUTPATIENT_CLINIC_OR_DEPARTMENT_OTHER): Payer: Self-pay | Admitting: *Deleted

## 2023-09-18 NOTE — Telephone Encounter (Signed)
Post ED Visit - Positive Culture Follow-up  Culture report reviewed by antimicrobial stewardship pharmacist: [facility] []  Enzo Bi, Pharm.D. []  Celedonio Miyamoto, Pharm.D., BCPS AQ-ID []  Garvin Fila, Pharm.D., BCPS []  Georgina Pillion, Pharm.D., BCPS []  [place], [address].D., BCPS, AAHIVP []  Estella Husk, Pharm.D., BCPS, AAHIVP []  Lysle Pearl, PharmD, BCPS []  Phillips Climes, PharmD, BCPS []  Agapito Games, PharmD, BCPS []  Verlan Friends, PharmD []  Mervyn Gay, PharmD, BCPS []  Vinnie Level, PharmD  [facility] []  Len Childs, PharmD []  Greer Pickerel, PharmD []  Adalberto Cole, PharmD []  Perlie Gold, Rph []  Lonell Face) Jean Rosenthal, PharmD []  Earl Many, PharmD []  Junita Push, PharmD []  Dorna Leitz, PharmD []  Terrilee Files, PharmD []  Lynann Beaver, PharmD []  Keturah Barre, PharmD []  Loralee Pacas, PharmD [x]  Laureen Ochs,  PharmD   Positive urine culture Treated with Cefpodoxime Proxetil, organism sensitive to the same and no further patient follow-up is required at this time.  Patsey Berthold 09/18/2023, 8:34 AM

## 2023-09-20 LAB — CULTURE, BLOOD (ROUTINE X 2)
Culture: NO GROWTH
Culture: NO GROWTH
Special Requests: ADEQUATE
Special Requests: ADEQUATE

## 2023-10-18 ENCOUNTER — Encounter (HOSPITAL_BASED_OUTPATIENT_CLINIC_OR_DEPARTMENT_OTHER): Payer: No Typology Code available for payment source | Attending: General Surgery | Admitting: General Surgery

## 2023-10-18 DIAGNOSIS — L98411 Non-pressure chronic ulcer of buttock limited to breakdown of skin: Secondary | ICD-10-CM | POA: Diagnosis not present

## 2023-10-18 DIAGNOSIS — G20A1 Parkinson's disease without dyskinesia, without mention of fluctuations: Secondary | ICD-10-CM | POA: Diagnosis not present

## 2023-10-18 DIAGNOSIS — I10 Essential (primary) hypertension: Secondary | ICD-10-CM | POA: Insufficient documentation

## 2023-10-18 DIAGNOSIS — I69354 Hemiplegia and hemiparesis following cerebral infarction affecting left non-dominant side: Secondary | ICD-10-CM | POA: Diagnosis not present

## 2023-10-18 DIAGNOSIS — E11622 Type 2 diabetes mellitus with other skin ulcer: Secondary | ICD-10-CM | POA: Diagnosis not present

## 2023-10-18 DIAGNOSIS — L98412 Non-pressure chronic ulcer of buttock with fat layer exposed: Secondary | ICD-10-CM | POA: Insufficient documentation

## 2023-10-18 NOTE — Progress Notes (Signed)
Robert, [name] ([id number]) [id number] Page 1 of 5 Visit Report for 10/18/2023 Chief Complaint Document Details Patient Name: Date of Service: Robert [name] 10/18/2023 10:45 A M Medical Record Number: [id number] Patient Account Number: [id number] Date of Birth/Sex: Treating RN: [birth year] (77 y.o. M) Primary Care Provider: DO Carolanne Grumbling, A NGELA Other Clinician: Referring Provider: Treating Provider/Extender: Duanne Guess DO NNELLY, A NGELA Weeks in Treatment: 6 Information Obtained from: Patient Chief Complaint Patient is at the clinic for treatment of open buttock wounds Electronic Signature(s) Signed: 10/18/2023 1:20:59 PM By: Duanne Guess MD FACS Entered By: Duanne Guess on 10/18/2023 09:11:18 -------------------------------------------------------------------------------- HPI Details Patient Name: Date of Service: Robert [name] 10/18/2023 10:45 A M Medical Record Number: [id number] Patient Account Number: [id number] Date of Birth/Sex: Treating RN: 07/13/1946 (77 y.o. 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 He is minimally mobile due to his medical issues and presents to clinic today with bilateral gluteal ulcers. They appear more like shear-type injuries, rather than pressure. He is on a low-air loss mattress at home and has a customized chair provided by the [place]. 10/18/2023: He returns today after a substantial absence. His wounds are healed. His wife and caregiver do have some questions about maintaining and protecting his  skin. Electronic Signature(s) Signed: 10/18/2023 1:20:59 PM By: Duanne Guess MD FACS Entered By: Duanne Guess on 10/18/2023 09:11:47 -------------------------------------------------------------------------------- Physical Exam Details Patient Name: Date of Service: Robert [name] 10/18/2023 10:45 A M Medical Record Number: [id number] Patient Account Number: [id number] Robert, [name] ([id number]) [id number] Page 2 of 5 Date of Birth/Sex: Treating RN: [birth year] (77 y.o. M) Primary Care Provider: Other Clinician: DO NNELLY, A NGELA Referring Provider: Treating Provider/Extender: Duanne Guess DO NNELLY, A NGELA Weeks in Treatment: 6 Constitutional Hypertensive, asymptomatic. . . . no acute distress. Respiratory Normal work of breathing on room air.. Notes 10/18/2023: No open wounds at this time. Skin is a bit dry. Electronic Signature(s) Signed: 10/18/2023 1:20:59 PM By: Duanne Guess MD FACS Entered By: Duanne Guess on 10/18/2023 09:12:35 -------------------------------------------------------------------------------- Physician Orders Details Patient Name: Date of Service: Robert [name] 10/18/2023 10:45 A M Medical Record Number: [id number] Patient Account Number: [id number] Date of Birth/Sex: Treating RN: [birth year] (76 y.o. 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 Off-Loading Turn and reposition every 2 hours Non Wound Condition pply the following to affected area as directed: - hydrocortisone 1% cream to buttocks daily for itching A may apply moisturizing lotion to buttocks as need  for dry skin Protect area with: - sacrum with mepitel foam border as needed for protection Other Non Wound Condition Orders/Instructions: - mepitel foam border 3x3 to left and right buttock for protection as needed with skin breakdown Home Health New wound care orders this week; continue Home Health for wound care. May utilize formulary equivalent dressing for wound treatment orders unless otherwise specified. 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 08/31/2023 No Yes E11.622 Type 2 diabetes mellitus with other skin ulcer 08/31/2023 No Yes I69.354 Hemiplegia and hemiparesis following cerebral  infarction affecting left non- 08/31/2023 No Yes dominant side Inactive Problems Resolved Problems ICD-10 Code Description Active Date Resolved Date L98.412 Non-pressure chronic ulcer of buttock with fat layer exposed 08/31/2023 08/31/2023 Electronic Signature(s) Signed: 10/18/2023 1:20:59 PM By: Duanne Guess MD FACS Entered By: Duanne Guess on 10/18/2023 09:00:06 -------------------------------------------------------------------------------- Progress Note Details Patient Name: Date of Service: Robert [name] 10/18/2023 10:45 A M Medical Record Number: [id number] Patient Account Number: [id number] Date of Birth/Sex: Treating RN: 02/18/1946 (77 y.o. 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 He is minimally mobile due to his medical issues and presents to clinic today with bilateral gluteal ulcers. They appear more like shear-type injuries, rather than pressure. He is on a low-air loss mattress at home and has a customized chair provided by the [place]. 10/18/2023: He returns today after a substantial absence. His wounds are healed. His wife and caregiver do have some questions about maintaining and protecting his skin. Objective Constitutional Hypertensive, asymptomatic. no acute distress. Vitals Time Taken: 11:21 AM, Height: 75 in, Weight: 225 lbs, BMI: 28.1, Temperature: 97.9 F,  Pulse: 83 bpm, Respiratory Rate: 18 breaths/min, Blood Pressure: 151/82 mmHg, Capillary Blood Glucose: 120 mg/dl. Respiratory Normal work of breathing on room air.. General Notes: 10/18/2023: No open wounds at this time. Skin is a bit dry. Integumentary (Hair, Skin) Wound #1 status is Open. Original cause of wound was Gradually Appeared. The date acquired was: 08/26/2021. The wound has been in treatment 6 weeks. The wound is located on the Left Gluteus. The wound measures 0cm length x 0cm width x 0cm depth; 0cm^2 area and 0cm^3 volume. There is no tunneling or undermining noted. There is a none present amount of drainage noted. There is no granulation within the wound bed. There is no necrotic tissue within the wound bed. The periwound skin appearance had no abnormalities  noted for texture. The periwound skin appearance had no abnormalities noted for moisture. The periwound skin appearance had no abnormalities noted for color. Periwound temperature was noted as No Abnormality. Wound #2 status is Open. Original cause of wound was Gradually Appeared. The date acquired was: 08/26/2021. The wound has been in treatment 6 weeks. The wound is located on the Right Gluteus. The wound measures 0cm length x 0cm width x 0cm depth; 0cm^2 area and 0cm^3 volume. There is no tunneling or undermining noted. There is a none present amount of drainage noted. There is no granulation within the wound bed. There is no necrotic tissue within the wound bed. The periwound skin appearance had no abnormalities noted for texture. The periwound skin appearance had no abnormalities noted for moisture. The periwound skin appearance had no abnormalities noted for color. Periwound temperature was noted as No Abnormality. 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 Off-Loading: Turn and reposition every 2 hours Non Wound Condition: Apply the following to affected area as directed: - hydrocortisone 1% cream to buttocks daily for itching may apply moisturizing lotion to buttocks as need for dry skin Protect area with: - sacrum with mepitel foam border as needed for protection Other Non Wound Condition Orders/Instructions: - mepitel foam border 3x3 to left and right buttock for protection as needed with skin breakdown Home Health: New wound care orders this week; continue Home Health for wound care. May utilize formulary equivalent dressing for wound treatment orders unless otherwise specified. Other Home Health Orders/Instructions: - [facility] 10/18/2023: He has no open wounds today. His wife and caregiver had some questions about protecting his skin from future damage. I think it is a balance of avoiding too much moisture, but preventing too much dryness as well. The original wounds were shear or friction related, rather than pressure. We discussed daily skin assessment and apply moisturizer if the skin is too dry or zinc oxide if the skin seems to be getting too moist. They will continue to use the foam dressings on his buttocks for protection. They may follow-up as needed in the future. SHAIQUAN, PETRI ([id number]) [id number] Page 5 of 5 Electronic Signature(s) Signed: 10/18/2023 1:20:59 PM By: Duanne Guess MD FACS Entered By: Duanne Guess on 10/18/2023 09:14:04 -------------------------------------------------------------------------------- SuperBill Details Patient Name: Date of Service: Robert [name] 10/18/2023 Medical Record Number: [id number] Patient Account Number: [id number] Date of Birth/Sex:  Treating RN: 11-Dec-1945 (77 y.o. 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 E11.622 Type 2 diabetes mellitus with other skin ulcer I69.354 Hemiplegia and hemiparesis following cerebral infarction affecting left non-dominant side Facility Procedures : CPT4 Code: [id number] Description: 99213 - WOUND CARE VISIT-LEV 3 EST PT Modifier: Quantity: 1 Physician Procedures : CPT4 Code Description Modifier [id number] - WC PHYS LEVEL 3 - EST PT ICD-10 Diagnosis Description L98.411 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

## 2023-10-19 NOTE — Progress Notes (Signed)
[name], Robert [name] ([id number]) [id number] Page 1 of 8 Visit Report for 10/18/2023 Arrival Information Details Patient Name: Date of Service: Robert [name] 10/18/2023 10:45 A M Medical Record Number: [id number] Patient Account Number: [id number] Date of Birth/Sex: Treating RN: [birth year] (77 y.o. M) Primary Care Robert [name]: DO Robert [name] Other Clinician: Referring Robert [name]: Treating Robert [name]/Extender: Robert Guess DO Robert [name] Weeks in Treatment: 6 Visit Information History Since Last Visit Added or deleted any medications: No Patient Arrived: Wheel Chair Any new allergies or adverse reactions: No Arrival Time: 11:21 Had a fall or experienced change in No Accompanied By: wife activities of daily living that may affect Transfer Assistance: Nurse, adult risk of falls: Patient Identification Verified: Yes Signs or symptoms of abuse/neglect since last visito No Secondary Verification Process Completed: Yes Hospitalized since last visit: No Patient Requires Transmission-Based Precautions: No Implantable device outside of the clinic excluding No Patient Has Alerts: No cellular tissue based products placed in the center since last visit: Pain Present Now: No Electronic Signature(s) Signed: 10/18/2023 4:30:16 PM By: Robert [name] Entered By: Robert [name] on 10/18/2023 11:34:43 -------------------------------------------------------------------------------- Clinic Level of Care Assessment Details Patient Name: Date of Service: Robert [name] 10/18/2023 10:45 A M Medical Record Number: [id number] Patient Account Number: [id number] Date of Birth/Sex: Treating RN: [birth year] (77 y.o. Robert [name] Primary Care Robert [name]: DO Robert [name] Other Clinician: Referring Robert [name]: Treating Robert [name]/Extender: Robert Guess DO Robert [name] Weeks in Treatment: 6 Clinic Level of Care Assessment Items TOOL 4 Quantity Score []  -  0 Use when only an EandM is performed on FOLLOW-UP visit ASSESSMENTS - Nursing Assessment / Reassessment X- 1 10 Reassessment of Co-morbidities (includes updates in patient status) X- 1 5 Reassessment of Adherence to Treatment Plan ASSESSMENTS - Wound and Skin A ssessment / Reassessment []  - 0 Simple Wound Assessment / Reassessment - one wound X- 2 5 Complex Wound Assessment / Reassessment - multiple wounds []  - 0 Dermatologic / Skin Assessment (not related to wound area) ASSESSMENTS - Focused Assessment []  - 0 Circumferential Edema Measurements - multi extremities []  - 0 Nutritional Assessment / Counseling / Intervention []  - 0 Lower Extremity Assessment (monofilament, tuning fork, pulses) Robert [name] ([id number]) [id number] Page 2 of 8 []  - 0 Peripheral Arterial Disease Assessment (using hand held doppler) ASSESSMENTS - Ostomy and/or Continence Assessment and Care []  - 0 Incontinence Assessment and Management []  - 0 Ostomy Care Assessment and Management (repouching, etc.) PROCESS - Coordination of Care X - Simple Patient / Family Education for ongoing care 1 15 []  - 0 Complex (extensive) Patient / Family Education for ongoing care X- 1 10 Staff obtains Chiropractor, Records, T Results / Process Orders est X- 1 10 Staff telephones HHA, Nursing Homes / Clarify orders / etc []  - 0 Routine Transfer to another Facility (non-emergent condition) []  - 0 Routine Hospital Admission (non-emergent condition) []  - 0 New Admissions / Manufacturing engineer / Ordering NPWT Apligraf, etc. , []  - 0 Emergency Hospital Admission (emergent condition) X- 1 10 Simple Discharge Coordination []  - 0 Complex (extensive) Discharge Coordination PROCESS - Special Needs []  - 0 Pediatric / Minor Patient Management []  - 0 Isolation Patient Management []  - 0 Hearing / Language / Visual special needs []  - 0 Assessment of Community assistance (transportation, D/C  planning, etc.) []  - 0 Additional assistance / Altered mentation []  - 0 Support Surface(s) Assessment (bed, cushion, seat, etc.) INTERVENTIONS - Wound Cleansing / Measurement []  - 0 Simple Wound  Cleansing - one wound X- 2 5 Complex Wound Cleansing - multiple wounds X- 1 5 Wound Imaging (photographs - any number of wounds) []  - 0 Wound Tracing (instead of photographs) []  - 0 Simple Wound Measurement - one wound []  - 0 Complex Wound Measurement - multiple wounds INTERVENTIONS - Wound Dressings X - Small Wound Dressing one or multiple wounds 1 10 []  - 0 Medium Wound Dressing one or multiple wounds []  - 0 Large Wound Dressing one or multiple wounds []  - 0 Application of Medications - topical []  - 0 Application of Medications - injection INTERVENTIONS - Miscellaneous []  - 0 External ear exam []  - 0 Specimen Collection (cultures, biopsies, blood, body fluids, etc.) []  - 0 Specimen(s) / Culture(s) sent or taken to Lab for analysis X- 1 10 Patient Transfer (multiple staff / Nurse, adult / Similar devices) []  - 0 Simple Staple / Suture removal (25 or less) []  - 0 Complex Staple / Suture removal (26 or more) []  - 0 Hypo / Hyperglycemic Management (close monitor of Blood Glucose) []  - 0 Ankle / Brachial Index (ABI) - do not check if billed separately Robert [name] ([id number]) [id number] Page 3 of 8 X- 1 5 Vital Signs Has the patient been seen at the hospital within the last three years: Yes Total Score: 110 Level Of Care: New/Established - Level 3 Electronic Signature(s) Signed: 10/18/2023 4:30:16 PM By: Robert [name] Entered By: Robert [name] on 10/18/2023 12:07:51 -------------------------------------------------------------------------------- Encounter Discharge Information Details Patient Name: Date of Service: Robert [name] 10/18/2023 10:45 A M Medical Record Number: [id number] Patient Account Number: [id number] Date of  Birth/Sex: Treating RN: [birth year] (77 y.o. Robert [name] Primary Care Robert [name]: DO Robert [name] Other Clinician: Referring Robert [name]: Treating Robert [name]/Extender: Robert Guess DO Robert [name] Weeks in Treatment: 6 Encounter Discharge Information Items Discharge Condition: Stable Ambulatory Status: Wheelchair Discharge Destination: Home Transportation: Private Auto Accompanied By: spouse and caregiver Schedule Follow-up Appointment: Yes Clinical Summary of Care: Patient Declined Electronic Signature(s) Signed: 10/18/2023 4:30:16 PM By: Robert [name] Entered By: Robert [name] on 10/18/2023 12:08:30 -------------------------------------------------------------------------------- Lower Extremity Assessment Details Patient Name: Date of Service: Robert [name] 10/18/2023 10:45 A M Medical Record Number: [id number] Patient Account Number: [id number] Date of Birth/Sex: Treating RN: 04-Feb-1946 (77 y.o. Robert [name] Primary Care Debbra Digiulio: DO Robert [name] Other Clinician: Referring Morganne Haile: Treating Cristo Ausburn/Extender: Robert Guess DO Robert [name] Weeks in Treatment: 6 Electronic Signature(s) Signed: 10/18/2023 4:30:16 PM By: Robert [name] Entered By: Robert [name] on 10/18/2023 11:35:40 -------------------------------------------------------------------------------- Multi Wound Chart Details Patient Name: Date of Service: Robert [name] 10/18/2023 10:45 A M Medical Record Number: [id number] Patient Account Number: [id number] Date of Birth/Sex: Treating RN: 01-11-1946 (77 y.o. Lorin Glass, Robert [name] ([id number]) [id number] Page 4 of 8 Primary Care Jinna Weinman: DO Robert [name] Other Clinician: Referring Horice Carrero: Treating Constantin Hillery/Extender: Robert Guess DO Robert [name] Weeks in Treatment: 6 Vital Signs Height(in): 75 Capillary Blood Glucose(mg/dl): 160 Weight(lbs): 737 Pulse(bpm): 83 Body Mass  Index(BMI): 28.1 Blood Pressure(mmHg): 151/82 Temperature(F): 97.9 Respiratory Rate(breaths/min): 18 [1:Photos:] [N/A:N/A] Left Gluteus Right Gluteus N/A Wound Location: Gradually Appeared Gradually Appeared N/A Wounding Event: Abrasion Abrasion N/A Primary Etiology: Cataracts, Deep Vein Thrombosis, Cataracts, Deep Vein Thrombosis, N/A Comorbid History: Hypertension, Type II Diabetes Hypertension, Type II Diabetes 08/26/2021 08/26/2021 N/A Date Acquired: 6 6 N/A Weeks of Treatment: Open Open N/A Wound Status: No No N/A Wound Recurrence: 0x0x0 0x0x0 N/A Measurements L x W x D (  cm) 0 0 N/A A (cm) : rea 0 0 N/A Volume (cm) : 100.00% 100.00% N/A % Reduction in Area: 100.00% 100.00% N/A % Reduction in Volume: Partial Thickness Full Thickness Without Exposed N/A Classification: Support Structures None Present None Present N/A Exudate Amount: None Present (0%) None Present (0%) N/A Granulation Amount: None Present (0%) None Present (0%) N/A Necrotic Amount: Fascia: No Fascia: No N/A Exposed Structures: Fat Layer (Subcutaneous Tissue): No Fat Layer (Subcutaneous Tissue): No Tendon: No Tendon: No Muscle: No Muscle: No Joint: No Joint: No Bone: No Bone: No Large (67-100%) Large (67-100%) N/A Epithelialization: No Abnormalities Noted No Abnormalities Noted N/A Periwound Skin Texture: No Abnormalities Noted No Abnormalities Noted N/A Periwound Skin Moisture: No Abnormalities Noted No Abnormalities Noted N/A Periwound Skin Color: No Abnormality No Abnormality N/A Temperature: Treatment Notes Electronic Signature(s) Signed: 10/18/2023 1:20:59 PM By: Robert Guess MD FACS Entered By: Robert [name] on 10/18/2023 12:01:00 -------------------------------------------------------------------------------- Multi-Disciplinary Care Plan Details Patient Name: Date of Service: Robert [name] 10/18/2023 10:45 A M Medical Record Number: [id number] Patient Account  Number: [id number] Date of Birth/Sex: Treating RN: 1946-04-20 (77 y.o. Robert [name] Primary Care Ree Alcalde: DO Robert [name] Other Clinician: Referring Derold Dorsch: Treating Renu Asby/Extender: Robert Guess DO Robert [name] Weeks in Treatment: 6 [place], Tr[place]on ([id number]) [id number] Page 5 of 8 Multidisciplinary Care Plan reviewed with physician Active Inactive Electronic Signature(s) Signed: 10/18/2023 4:30:16 PM By: Robert [name] Entered By: Robert [name] on 10/18/2023 11:47:10 -------------------------------------------------------------------------------- Pain Assessment Details Patient Name: Date of Service: Robert [name] 10/18/2023 10:45 A M Medical Record Number: [id number] Patient Account Number: [id number] Date of Birth/Sex: Treating RN: December 24, 1945 (77 y.o. M) Primary Care Hakop Humbarger: DO Carolanne Grumbling, A [name] Other Clinician: Referring Ercia Crisafulli: Treating Deaysia Grigoryan/Extender: Robert Guess DO Robert [name] Weeks in Treatment: 6 Active Problems Location of Pain Severity and Description of Pain Patient Has Paino No Site Locations Rate the pain. Current Pain Level: 0 Pain Management and Medication Current Pain Management: Electronic Signature(s) Signed: 10/18/2023 4:30:16 PM By: Robert [name] Entered By: Robert [name] on 10/18/2023 11:35:30 -------------------------------------------------------------------------------- Patient/Caregiver Education Details Patient Name: Date of Service: Robert [name] 12/23/2024andnbsp10:45 A M Medical Record Number: [id number] Patient Account Number: [id number] Date of Birth/Gender: Treating RN: 1946-05-18 (77 y.o. Robert [name] Primary Care Physician: DO Robert [name] Other Clinician: Referring Physician: Treating Physician/Extender: Robert Guess DO Robert [name] Weeks in Treatment: 6 [place], [place] ([id number]) [id number] Page 6 of  8 Education Assessment Education Provided To: Patient Education Topics Provided Pressure: Methods: Explain/Verbal Responses: Reinforcements needed, State content correctly Wound/Skin Impairment: Methods: Explain/Verbal Responses: Reinforcements needed, State content correctly Electronic Signature(s) Signed: 10/18/2023 4:30:16 PM By: Robert [name] Entered By: Robert [name] on 10/18/2023 11:46:28 -------------------------------------------------------------------------------- Wound Assessment Details Patient Name: Date of Service: Robert [name] 10/18/2023 10:45 A M Medical Record Number: [id number] Patient Account Number: [id number] Date of Birth/Sex: Treating RN: 1946/09/29 (77 y.o. M) Primary Care Ling Flesch: DO Robert [name] Other Clinician: Referring Olamide Carattini: Treating Qamar Aughenbaugh/Extender: Robert Guess DO Robert [name] Weeks in Treatment: 6 Wound Status Wound Number: 1 Primary Abrasion Etiology: Wound Location: Left Gluteus Wound Status: Open Wounding Event: Gradually Appeared Comorbid Cataracts, Deep Vein Thrombosis, Hypertension, Type II Date Acquired: 08/26/2021 History: Diabetes Weeks Of Treatment: 6 Clustered Wound: No Photos Wound Measurements Length: (cm) Width: (cm) Depth: (cm) Area: (cm) Volume: (cm) 0 % Reduction in Area: 100% 0 % Reduction in Volume: 100% 0 Epithelialization: Large (67-100%) 0 Tunneling: No 0  Undermining: No Wound Description Classification: Partial Thickness Exudate Amount: None Present Foul Odor After Cleansing: No Slough/Fibrino No Wound Bed Seaman, Robert [name] ([id number]) [id number] Page 7 of 8 Granulation Amount: None Present (0%) Exposed Structure Necrotic Amount: None Present (0%) Fascia Exposed: No Fat Layer (Subcutaneous Tissue) Exposed: No Tendon Exposed: No Muscle Exposed: No Joint Exposed: No Bone Exposed: No Periwound Skin Texture Texture Color No Abnormalities Noted:  Yes No Abnormalities Noted: Yes Moisture Temperature / Pain No Abnormalities Noted: Yes Temperature: No Abnormality Electronic Signature(s) Signed: 10/18/2023 4:30:16 PM By: Robert [name] Entered By: Robert [name] on 10/18/2023 11:36:19 -------------------------------------------------------------------------------- Wound Assessment Details Patient Name: Date of Service: Robert [name] 10/18/2023 10:45 A M Medical Record Number: [id number] Patient Account Number: [id number] Date of Birth/Sex: Treating RN: [birth year] (77 y.o. M) Primary Care Resean Brander: DO Robert [name] Other Clinician: Referring Jacqulin Brandenburger: Treating Leola Fiore/Extender: Robert Guess DO Robert [name] Weeks in Treatment: 6 Wound Status Wound Number: 2 Primary Abrasion Etiology: Wound Location: Right Gluteus Wound Status: Open Wounding Event: Gradually Appeared Comorbid Cataracts, Deep Vein Thrombosis, Hypertension, Type II Date Acquired: 08/26/2021 History: Diabetes Weeks Of Treatment: 6 Clustered Wound: No Photos Wound Measurements Length: (cm) Width: (cm) Depth: (cm) Area: (cm) Volume: (cm) 0 % Reduction in Area: 100% 0 % Reduction in Volume: 100% 0 Epithelialization: Large (67-100%) 0 Tunneling: No 0 Undermining: No Wound Description Classification: Full Thickness Without Exposed Support Exudate Amount: None Present Structures Foul Odor After Cleansing: No Slough/Fibrino No Wound Bed Granulation Amount: None Present (0%) Exposed Structure Necrotic Amount: None Present (0%) Fascia Exposed: No Fat Layer (Subcutaneous Tissue) Exposed: No [place], Stevens ([id number]) [id number] Page 8 of 8 Tendon Exposed: No Muscle Exposed: No Joint Exposed: No Bone Exposed: No Periwound Skin Texture Texture Color No Abnormalities Noted: Yes No Abnormalities Noted: Yes Moisture Temperature / Pain No Abnormalities Noted: Yes Temperature: No Abnormality Electronic  Signature(s) Signed: 10/18/2023 4:30:16 PM By: Robert [name] Entered By: Robert [name] on 10/18/2023 11:36:45 -------------------------------------------------------------------------------- Vitals Details Patient Name: Date of Service: Robert [name] 10/18/2023 10:45 A M Medical Record Number: [id number] Patient Account Number: [id number] Date of Birth/Sex: Treating RN: 1946/04/13 (77 y.o. M) Primary Care Jasha Hodzic: DO Robert [name] Other Clinician: Referring Heriberto Stmartin: Treating Gjon Letarte/Extender: Robert Guess DO Robert [name] Weeks in Treatment: 6 Vital Signs Time Taken: 11:21 Temperature (F): 97.9 Height (in): 75 Pulse (bpm): 83 Weight (lbs): 225 Respiratory Rate (breaths/min): 18 Body Mass Index (BMI): 28.1 Blood Pressure (mmHg): 151/82 Capillary Blood Glucose (mg/dl): 182 Reference Range: 80 - 120 mg / dl Electronic Signature(s) Signed: 10/18/2023 4:30:16 PM By: Robert [name] Entered By: Robert [name] on 10/18/2023 11:34:56

## 2024-01-22 ENCOUNTER — Emergency Department (HOSPITAL_COMMUNITY)

## 2024-01-22 ENCOUNTER — Other Ambulatory Visit: Payer: Self-pay

## 2024-01-22 ENCOUNTER — Encounter (HOSPITAL_COMMUNITY): Payer: Self-pay | Admitting: Emergency Medicine

## 2024-01-22 ENCOUNTER — Inpatient Hospital Stay (HOSPITAL_COMMUNITY)
Admission: EM | Admit: 2024-01-22 | Discharge: 2024-01-27 | DRG: 699 | Disposition: A | Discharge status: Home Health | Attending: Internal Medicine | Admitting: Internal Medicine

## 2024-01-22 DIAGNOSIS — I69354 Hemiplegia and hemiparesis following cerebral infarction affecting left non-dominant side: Secondary | ICD-10-CM

## 2024-01-22 DIAGNOSIS — G20A1 Parkinson's disease without dyskinesia, without mention of fluctuations: Secondary | ICD-10-CM | POA: Diagnosis present

## 2024-01-22 DIAGNOSIS — L89322 Pressure ulcer of left buttock, stage 2: Secondary | ICD-10-CM | POA: Diagnosis present

## 2024-01-22 DIAGNOSIS — M21372 Foot drop, left foot: Secondary | ICD-10-CM | POA: Diagnosis present

## 2024-01-22 DIAGNOSIS — H9193 Unspecified hearing loss, bilateral: Secondary | ICD-10-CM | POA: Diagnosis present

## 2024-01-22 DIAGNOSIS — I10 Essential (primary) hypertension: Secondary | ICD-10-CM | POA: Diagnosis present

## 2024-01-22 DIAGNOSIS — N401 Enlarged prostate with lower urinary tract symptoms: Secondary | ICD-10-CM | POA: Diagnosis present

## 2024-01-22 DIAGNOSIS — Z820 Family history of epilepsy and other diseases of the nervous system: Secondary | ICD-10-CM

## 2024-01-22 DIAGNOSIS — Y846 Urinary catheterization as the cause of abnormal reaction of the patient, or of later complication, without mention of misadventure at the time of the procedure: Secondary | ICD-10-CM | POA: Diagnosis present

## 2024-01-22 DIAGNOSIS — L89312 Pressure ulcer of right buttock, stage 2: Secondary | ICD-10-CM | POA: Diagnosis present

## 2024-01-22 DIAGNOSIS — Z7982 Long term (current) use of aspirin: Secondary | ICD-10-CM | POA: Diagnosis not present

## 2024-01-22 DIAGNOSIS — T83511A Infection and inflammatory reaction due to indwelling urethral catheter, initial encounter: Principal | ICD-10-CM | POA: Diagnosis present

## 2024-01-22 DIAGNOSIS — Z9049 Acquired absence of other specified parts of digestive tract: Secondary | ICD-10-CM | POA: Diagnosis not present

## 2024-01-22 DIAGNOSIS — B9689 Other specified bacterial agents as the cause of diseases classified elsewhere: Secondary | ICD-10-CM | POA: Diagnosis not present

## 2024-01-22 DIAGNOSIS — Z87891 Personal history of nicotine dependence: Secondary | ICD-10-CM

## 2024-01-22 DIAGNOSIS — R338 Other retention of urine: Secondary | ICD-10-CM | POA: Diagnosis present

## 2024-01-22 DIAGNOSIS — A498 Other bacterial infections of unspecified site: Secondary | ICD-10-CM

## 2024-01-22 DIAGNOSIS — E119 Type 2 diabetes mellitus without complications: Secondary | ICD-10-CM | POA: Diagnosis present

## 2024-01-22 DIAGNOSIS — Z7901 Long term (current) use of anticoagulants: Secondary | ICD-10-CM

## 2024-01-22 DIAGNOSIS — F0283 Dementia in other diseases classified elsewhere, unspecified severity, with mood disturbance: Secondary | ICD-10-CM | POA: Diagnosis present

## 2024-01-22 DIAGNOSIS — L89152 Pressure ulcer of sacral region, stage 2: Secondary | ICD-10-CM | POA: Diagnosis present

## 2024-01-22 DIAGNOSIS — Z86718 Personal history of other venous thrombosis and embolism: Secondary | ICD-10-CM

## 2024-01-22 DIAGNOSIS — B961 Klebsiella pneumoniae [K. pneumoniae] as the cause of diseases classified elsewhere: Secondary | ICD-10-CM | POA: Diagnosis present

## 2024-01-22 DIAGNOSIS — Z801 Family history of malignant neoplasm of trachea, bronchus and lung: Secondary | ICD-10-CM

## 2024-01-22 DIAGNOSIS — E7800 Pure hypercholesterolemia, unspecified: Secondary | ICD-10-CM | POA: Diagnosis present

## 2024-01-22 DIAGNOSIS — N390 Urinary tract infection, site not specified: Principal | ICD-10-CM | POA: Diagnosis present

## 2024-01-22 DIAGNOSIS — R300 Dysuria: Secondary | ICD-10-CM | POA: Diagnosis present

## 2024-01-22 DIAGNOSIS — Z7984 Long term (current) use of oral hypoglycemic drugs: Secondary | ICD-10-CM

## 2024-01-22 DIAGNOSIS — F32A Depression, unspecified: Secondary | ICD-10-CM | POA: Diagnosis present

## 2024-01-22 DIAGNOSIS — Z8042 Family history of malignant neoplasm of prostate: Secondary | ICD-10-CM

## 2024-01-22 DIAGNOSIS — I69398 Other sequelae of cerebral infarction: Secondary | ICD-10-CM

## 2024-01-22 DIAGNOSIS — Z1612 Extended spectrum beta lactamase (ESBL) resistance: Secondary | ICD-10-CM | POA: Diagnosis present

## 2024-01-22 DIAGNOSIS — Z87440 Personal history of urinary (tract) infections: Secondary | ICD-10-CM

## 2024-01-22 DIAGNOSIS — K219 Gastro-esophageal reflux disease without esophagitis: Secondary | ICD-10-CM | POA: Diagnosis present

## 2024-01-22 DIAGNOSIS — Z833 Family history of diabetes mellitus: Secondary | ICD-10-CM

## 2024-01-22 DIAGNOSIS — Z882 Allergy status to sulfonamides status: Secondary | ICD-10-CM

## 2024-01-22 DIAGNOSIS — Z79899 Other long term (current) drug therapy: Secondary | ICD-10-CM | POA: Diagnosis not present

## 2024-01-22 DIAGNOSIS — Z974 Presence of external hearing-aid: Secondary | ICD-10-CM

## 2024-01-22 DIAGNOSIS — Z888 Allergy status to other drugs, medicaments and biological substances status: Secondary | ICD-10-CM

## 2024-01-22 LAB — CBC WITH DIFFERENTIAL/PLATELET
Abs Immature Granulocytes: 0.01 10*3/uL (ref 0.00–0.07)
Basophils Absolute: 0.1 10*3/uL (ref 0.0–0.1)
Basophils Relative: 1 %
Eosinophils Absolute: 0.3 10*3/uL (ref 0.0–0.5)
Eosinophils Relative: 5 %
HCT: 46.5 % (ref 39.0–52.0)
Hemoglobin: 14.9 g/dL (ref 13.0–17.0)
Immature Granulocytes: 0 %
Lymphocytes Relative: 21 %
Lymphs Abs: 1.2 10*3/uL (ref 0.7–4.0)
MCH: 29.9 pg (ref 26.0–34.0)
MCHC: 32 g/dL (ref 30.0–36.0)
MCV: 93.2 fL (ref 80.0–100.0)
Monocytes Absolute: 0.7 10*3/uL (ref 0.1–1.0)
Monocytes Relative: 13 %
Neutro Abs: 3.3 10*3/uL (ref 1.7–7.7)
Neutrophils Relative %: 60 %
Platelets: 314 10*3/uL (ref 150–400)
RBC: 4.99 MIL/uL (ref 4.22–5.81)
RDW: 13.4 % (ref 11.5–15.5)
WBC: 5.6 10*3/uL (ref 4.0–10.5)
nRBC: 0 % (ref 0.0–0.2)

## 2024-01-22 LAB — COMPREHENSIVE METABOLIC PANEL WITH GFR
ALT: 13 U/L (ref 0–44)
AST: 17 U/L (ref 15–41)
Albumin: 3.5 g/dL (ref 3.5–5.0)
Alkaline Phosphatase: 68 U/L (ref 38–126)
Anion gap: 9 (ref 5–15)
BUN: 13 mg/dL (ref 8–23)
CO2: 28 mmol/L (ref 22–32)
Calcium: 9.2 mg/dL (ref 8.9–10.3)
Chloride: 103 mmol/L (ref 98–111)
Creatinine, Ser: 0.86 mg/dL (ref 0.61–1.24)
GFR, Estimated: >60 mL/min (ref >60)
Glucose, Bld: 153 mg/dL — ABNORMAL HIGH (ref 70–99)
Potassium: 3.8 mmol/L (ref 3.5–5.1)
Sodium: 140 mmol/L (ref 135–145)
Total Bilirubin: 0.5 mg/dL (ref 0.0–1.2)
Total Protein: 7.2 g/dL (ref 6.5–8.1)

## 2024-01-22 LAB — URINALYSIS, W/ REFLEX TO CULTURE (INFECTION SUSPECTED)
Bilirubin Urine: NEGATIVE
Glucose, UA: NEGATIVE mg/dL
Hgb urine dipstick: NEGATIVE
Ketones, ur: 5 mg/dL — AB
Nitrite: NEGATIVE
Protein, ur: NEGATIVE mg/dL
Specific Gravity, Urine: 1.011 (ref 1.005–1.030)
WBC, UA: >50 WBC/hpf (ref 0–5)
pH: 7 (ref 5.0–8.0)

## 2024-01-22 LAB — GLUCOSE, CAPILLARY
Glucose-Capillary: 125 mg/dL — ABNORMAL HIGH (ref 70–99)
Glucose-Capillary: 132 mg/dL — ABNORMAL HIGH (ref 70–99)

## 2024-01-22 LAB — TROPONIN I (HIGH SENSITIVITY)
Troponin I (High Sensitivity): 7 ng/L (ref <18)
Troponin I (High Sensitivity): 6 ng/L (ref <18)

## 2024-01-22 LAB — HEMOGLOBIN A1C
Hgb A1c MFr Bld: 6.1 % — ABNORMAL HIGH (ref 4.8–5.6)
Mean Plasma Glucose: 128.37 mg/dL

## 2024-01-22 LAB — I-STAT CG4 LACTIC ACID, ED: Lactic Acid, Venous: 1.8 mmol/L (ref 0.5–1.9)

## 2024-01-22 LAB — LIPASE, BLOOD: Lipase: 23 U/L (ref 11–51)

## 2024-01-22 MED ORDER — CARBIDOPA-LEVODOPA ER 25-100 MG PO TBCR
2.0000 | EXTENDED_RELEASE_TABLET | Freq: Every day | ORAL | Status: DC
  Administered 2024-01-22 – 2024-01-26 (×5): 2 via ORAL
  Filled 2024-01-22 (×5): qty 2

## 2024-01-22 MED ORDER — SODIUM CHLORIDE 0.9 % IV SOLN
1.0000 g | Freq: Three times a day (TID) | INTRAVENOUS | Status: AC
  Administered 2024-01-22 – 2024-01-26 (×13): 1 g via INTRAVENOUS
  Filled 2024-01-22 (×13): qty 20

## 2024-01-22 MED ORDER — RASAGILINE MESYLATE 1 MG PO TABS
1.0000 mg | ORAL_TABLET | Freq: Every day | ORAL | Status: DC
  Administered 2024-01-23 – 2024-01-27 (×5): 1 mg via ORAL
  Filled 2024-01-22 (×7): qty 1

## 2024-01-22 MED ORDER — ASPIRIN 81 MG PO TBEC
81.0000 mg | DELAYED_RELEASE_TABLET | Freq: Every day | ORAL | Status: DC
  Administered 2024-01-23 – 2024-01-27 (×5): 81 mg via ORAL
  Filled 2024-01-22 (×5): qty 1

## 2024-01-22 MED ORDER — ACETAMINOPHEN 650 MG RE SUPP: 650.0000 mg | Freq: Four times a day (QID) | RECTAL | Status: DC | PRN

## 2024-01-22 MED ORDER — INSULIN ASPART 100 UNIT/ML IJ SOLN
0.0000 [IU] | Freq: Every day | INTRAMUSCULAR | Status: DC
  Administered 2024-01-26: 2 [IU] via SUBCUTANEOUS
  Filled 2024-01-22: qty 0.05

## 2024-01-22 MED ORDER — INSULIN ASPART 100 UNIT/ML IJ SOLN
0.0000 [IU] | Freq: Three times a day (TID) | INTRAMUSCULAR | Status: DC
  Administered 2024-01-22 – 2024-01-23 (×4): 2 [IU] via SUBCUTANEOUS
  Administered 2024-01-24 (×2): 3 [IU] via SUBCUTANEOUS
  Administered 2024-01-25: 2 [IU] via SUBCUTANEOUS
  Administered 2024-01-25: 3 [IU] via SUBCUTANEOUS
  Administered 2024-01-25 – 2024-01-27 (×5): 2 [IU] via SUBCUTANEOUS
  Filled 2024-01-22: qty 0.15

## 2024-01-22 MED ORDER — PIMAVANSERIN TARTRATE 34 MG PO CAPS
34.0000 mg | ORAL_CAPSULE | Freq: Every day | ORAL | Status: DC
  Administered 2024-01-23 – 2024-01-27 (×5): 34 mg via ORAL
  Filled 2024-01-22 (×7): qty 1

## 2024-01-22 MED ORDER — ONDANSETRON HCL 4 MG PO TABS
4.0000 mg | ORAL_TABLET | Freq: Four times a day (QID) | ORAL | Status: DC | PRN
Start: 2024-01-22 — End: 2024-01-27

## 2024-01-22 MED ORDER — ALBUTEROL SULFATE (2.5 MG/3ML) 0.083% IN NEBU: 2.5000 mg | INHALATION_SOLUTION | RESPIRATORY_TRACT | Status: DC | PRN

## 2024-01-22 MED ORDER — APIXABAN 5 MG PO TABS
5.0000 mg | ORAL_TABLET | Freq: Two times a day (BID) | ORAL | Status: DC
  Administered 2024-01-22 – 2024-01-27 (×10): 5 mg via ORAL
  Filled 2024-01-22 (×10): qty 1

## 2024-01-22 MED ORDER — BUSPIRONE HCL 10 MG PO TABS: 5.0000 mg | ORAL_TABLET | ORAL | Status: DC

## 2024-01-22 MED ORDER — TRAZODONE HCL 50 MG PO TABS: 25.0000 mg | ORAL_TABLET | Freq: Every evening | ORAL | Status: DC | PRN

## 2024-01-22 MED ORDER — BUSPIRONE HCL 5 MG PO TABS
10.0000 mg | ORAL_TABLET | Freq: Every morning | ORAL | Status: DC
  Administered 2024-01-23 – 2024-01-27 (×5): 10 mg via ORAL
  Filled 2024-01-22 (×5): qty 2

## 2024-01-22 MED ORDER — CARBIDOPA-LEVODOPA ER 25-100 MG PO TBCR
2.0000 | EXTENDED_RELEASE_TABLET | ORAL | Status: DC
Start: 2024-01-22 — End: 2024-01-22

## 2024-01-22 MED ORDER — ENOXAPARIN SODIUM 40 MG/0.4ML IJ SOSY: 40.0000 mg | PREFILLED_SYRINGE | INTRAMUSCULAR | Status: DC

## 2024-01-22 MED ORDER — ACETAMINOPHEN 325 MG PO TABS: 650.0000 mg | ORAL_TABLET | Freq: Four times a day (QID) | ORAL | Status: DC | PRN

## 2024-01-22 MED ORDER — MEMANTINE HCL 10 MG PO TABS
20.0000 mg | ORAL_TABLET | Freq: Every day | ORAL | Status: DC
  Administered 2024-01-22 – 2024-01-26 (×5): 20 mg via ORAL
  Filled 2024-01-22 (×5): qty 2

## 2024-01-22 MED ORDER — PANTOPRAZOLE SODIUM 40 MG PO TBEC
40.0000 mg | DELAYED_RELEASE_TABLET | Freq: Every day | ORAL | Status: DC
  Administered 2024-01-23 – 2024-01-27 (×5): 40 mg via ORAL
  Filled 2024-01-22 (×5): qty 1

## 2024-01-22 MED ORDER — SIMVASTATIN 20 MG PO TABS
20.0000 mg | ORAL_TABLET | Freq: Every day | ORAL | Status: DC
Start: 2024-01-22 — End: 2024-01-27
  Administered 2024-01-22 – 2024-01-26 (×5): 20 mg via ORAL
  Filled 2024-01-22 (×5): qty 1

## 2024-01-22 MED ORDER — ONDANSETRON HCL 4 MG/2ML IJ SOLN: 4.0000 mg | Freq: Four times a day (QID) | INTRAMUSCULAR | Status: DC | PRN

## 2024-01-22 MED ORDER — CARBIDOPA-LEVODOPA ER 25-100 MG PO TBCR
3.0000 | EXTENDED_RELEASE_TABLET | Freq: Two times a day (BID) | ORAL | Status: DC
  Administered 2024-01-23 – 2024-01-27 (×9): 3 via ORAL
  Filled 2024-01-22 (×10): qty 3

## 2024-01-22 MED ORDER — SODIUM CHLORIDE 0.9 % IV SOLN
1.0000 g | Freq: Once | INTRAVENOUS | Status: AC
  Administered 2024-01-22: 1 g via INTRAVENOUS
  Filled 2024-01-22: qty 20

## 2024-01-22 MED ORDER — BUSPIRONE HCL 5 MG PO TABS
5.0000 mg | ORAL_TABLET | Freq: Two times a day (BID) | ORAL | Status: DC
  Administered 2024-01-22 – 2024-01-26 (×9): 5 mg via ORAL
  Filled 2024-01-22 (×9): qty 1

## 2024-01-22 MED ORDER — OXYBUTYNIN CHLORIDE 5 MG PO TABS
5.0000 mg | ORAL_TABLET | Freq: Two times a day (BID) | ORAL | Status: DC
  Administered 2024-01-22 – 2024-01-27 (×10): 5 mg via ORAL
  Filled 2024-01-22 (×10): qty 1

## 2024-01-22 NOTE — H&P (Signed)
 History and Physical  Robert [name] EAV:[id number] DOB: [birth year] DOA: 01/22/2024  PCP: Noel Gerold, NP   Chief Complaint: Positive urine culture  HPI: Robert [name] is a 78 y.o. male with medical history significant for CVA, BPH, hypertension, hyperlipidemia, DVT status post IVC, urinary retention with chronic Foley admitted to the hospital with Klebsiella ESBL UTI.  History is provided by the patient as well as his wife who is at the bedside.  They state he has essentially been in his usual state of health, though he has been having some low-grade fevers the last few days, and some loose stools.  Otherwise he has been doing pretty well, wife concurs that he has been at his baseline energy and mental status level.  On 3/24, he had his Foley catheter exchanged at home.  After the exchange, his wife obtained a urine sample from the new catheter [facility].  She was called today and told that his urine culture was positive for infection, and that he needs to go to the ER for evaluation and management.  Per printed urine culture report, he has ESBL Klebsiella UTI.  This is visible in the ER provider's note.  He was started on empiric IV meropenem, and hospitalist admission was requested.  Review of Systems: Please see HPI for pertinent positives and negatives. A complete 10 system review of systems are otherwise negative.  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   Diabetes mellitus without complication (HCC)    Diverticulosis    Frequency of urination    GERD (gastroesophageal reflux disease)    Gross hematuria    History of acute pyelonephritis    10-13-2012   History of CVA with residual deficit    2008--  left side of body weakness and foot drop (wears leg brace and uses cane)   History of DVT of lower extremity    2008--  cva    Hypercholesteremia    Hyperlipidemia    Hyperlipidemia    Hypertension    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 2008   Left leg DVT (HCC)    Neuromuscular disorder (HCC)    Parkinsons   S/P insertion of IVC (inferior vena caval) filter    2008   Urethral stricture    Urgency of urination    Urinary retention    Weakness of left side of body    secondary to cva 2008   Wears glasses    Wears hearing aid    bilateral-- wears intermittantly   Past Surgical History:  Procedure Laterality Date   CYSTOSCOPY WITH RETROGRADE URETHROGRAM N/A 10/23/2015   Procedure: CYSTOSCOPY WITH RETROGRADE URETHROGRAM;  Surgeon: Barron Alvine, MD;  Location[facility];  Service: Urology;  Laterality: N/A;   CYSTOSCOPY WITH URETHRAL DILATATION N/A 10/23/2015   Procedure: CYSTOSCOPY WITH URETHRAL BALLOON DILATATION;  Surgeon: Barron Alvine, MD;  Location: [facility];  Service: Urology;  Laterality: N/A;  BALLOON DILATION    IVC FILTER PLACEMENT ([facility] HX)  2008   LAPAROSCOPIC CHOLECYSTECTOMY SINGLE PORT N/A 01/09/2021   Procedure: LAPAROSCOPIC CHOLECYSTECTOMY WITH IOC;  Surgeon: Karie Soda, MD;  Location: [facility];  Service: General;  Laterality: N/A;  90 MIN   TRANSCAROTID ARTERY REVASCULARIZATION  Left 05/12/2021   Procedure: LEFT TRANSCAROTID ARTERY REVASCULARIZATION;  Surgeon: Cephus Shelling,  MD;  Location: [facility];  Service: Vascular;  Laterality: Left;   Social History:  reports that he quit smoking about 53 years ago. His smoking use included cigarettes. He started smoking about 63 years ago. He has never used smokeless tobacco. He reports that he does not drink alcohol and does not use drugs.  Allergies  Allergen Reactions   Fluconazole Rash   Propofol Other (See Comments)    "hiccups for weeks"  Other reaction(s): Hiccups   Lisinopril Cough   Sulfamethoxazole-Trimethoprim Diarrhea   Fluoxetine Nausea Only and Other (See Comments)    Hallucinations, Feeling  nervous, Nausea, Tachycardia    Family History  Problem Relation Age of Onset   Diabetes Sister    Lung cancer Brother        Post 9/11 voluntary work in [employer]   Prostate cancer Brother    Other Mother        passed away young- non medical   Alzheimer's disease Father    Healthy Son      Prior to Admission medications   Medication Sig Start Date End Date Taking? Authorizing Provider  bacitracin 500 UNIT/GM ointment Apply 1 Application topically daily as needed for wound care.   Yes [provider]  cephALEXin (KEFLEX) 250 MG capsule Take 500 mg by mouth 2 (two) times daily.   Yes [provider]  cetirizine (ZYRTEC) 10 MG tablet Take 10 mg by mouth daily.   Yes [provider]  Dupilumab 300 MG/2ML SOAJ Inject 600 mg into the skin.   Yes [provider]  HYDROCORTISONE ACE, RECTAL, 30 MG SUPP Place 30 mg rectally at bedtime.   Yes [provider]  miconazole (MICOTIN) 2 % powder Apply 1 Application topically daily.   Yes [provider]  senna-docusate (SENOKOT-S) 8.6-50 MG tablet Take 2 tablets by mouth daily as needed for mild constipation or moderate constipation.   Yes [provider]  triamcinolone cream (KENALOG) 0.1 % Apply 1 Application topically.   Yes [provider]  zinc oxide 20 % ointment Apply 1 Application topically.   Yes [provider]  Acidophilus Lactobacillus CAPS See admin instructions.    [provider]  ACIDOPHILUS LACTOBACILLUS PO Take 1 tablet by mouth at bedtime. 02/03/22   [provider]  apixaban (ELIQUIS) 5 MG TABS tablet Take 1 tablet (5 mg total) by mouth 2 (two) times daily. 05/27/21  Yes Love, Evlyn Kanner, PA-C  ascorbic acid (VITAMIN C) 500 MG tablet TAKE ONE TABLET BY MOUTH DAILY FOR PREVENTION OF UTI 02/17/22  Yes [provider]  aspirin EC 81 MG tablet Take 81 mg by mouth daily. Swallow whole.   Yes [provider]  benzonatate (TESSALON)  100 MG capsule Take 100 mg by mouth 3 (three) times daily as needed for cough. Patient not taking: Reported on 03/16/2023 10/01/21   [provider]  Blood Glucose Monitoring Suppl (ONETOUCH VERIO FLEX SYSTEM) w/Device KIT check blood sugar 11/25/20   [provider]  Carbidopa-Levodopa ER (SINEMET CR) 25-100 MG tablet controlled release 2-3 tablets See admin instructions. Take 3 tablets by mouth in the morning and afternoon. Take 2 tablets in the evening. 12/18/21  Yes [provider]  cephALEXin (KEFLEX) 500 MG capsule TAKE ONE CAPSULE BY MOUTH TWICE A DAY FOR UTI 03/13/22   [provider]  Cholecalciferol (VITAMIN D3) 50 MCG (2000 UT) TABS 1 tablet    [provider]  Cholecalciferol 50 MCG (2000 UT) TABS Take  2,000 Units by mouth daily. 12/23/21   [provider]  feeding supplement (ENSURE ENLIVE / ENSURE PLUS) LIQD Take 237 mLs by mouth 2 (two) times daily between meals. 02/11/22   Rolly Salter, MD  finasteride (PROSCAR) 5 MG tablet Take 5 mg by mouth at bedtime. 01/07/22   [provider]  Fluocinolone Acetonide Body 0.01 % OIL Apply 1 application. topically See admin instructions. Apply thin film to the scalp 1-2 times daily as needed for itching 11/06/21   [provider]  hydrOXYzine (ATARAX) 10 MG tablet Take 10 mg by mouth 3 (three) times daily as needed.    [provider]  memantine (NAMENDA) 10 MG tablet Take 20 mg by mouth at bedtime.    [provider]  metFORMIN (GLUCOPHAGE) 500 MG tablet Take 500 mg by mouth at bedtime.    [provider]  methenamine (HIPREX) 1 g tablet Take 1 g by mouth 2 (two) times daily. for infection prevention 02/03/22   [provider]  methenamine (HIPREX) 1 g tablet 1 tablet    [provider]  nystatin-triamcinolone ointment (MYCOLOG) Apply 1 application. topically See admin instructions. Apply thin film to groin and leg twice a day 10/26/21    [provider]  omeprazole (PRILOSEC) 20 MG capsule Take 20 mg by mouth daily.   Yes [provider]  ondansetron (ZOFRAN-ODT) 4 MG disintegrating tablet Take 1 tablet (4 mg total) by mouth every 8 (eight) hours as needed for nausea or vomiting. Patient not taking: Reported on 03/16/2023 11/14/22   Renne Crigler, PA-C  Pimavanserin Tartrate (NUPLAZID) 34 MG CAPS Take 34 mg by mouth daily.   Yes [provider]  polyethylene glycol (MIRALAX / GLYCOLAX) 17 g packet Take 17 g by mouth daily as needed for mild constipation or moderate constipation. 05/12/17  Yes [provider]  rasagiline (AZILECT) 1 MG TABS tablet Take 1 mg by mouth daily.    [provider]  simvastatin (ZOCOR) 40 MG tablet Take 40 mg by mouth at bedtime. 09/05/15   [provider]  tacrolimus (PROTOPIC) 0.1 % ointment Apply 1 application. topically See admin instructions. Apply to groin, armpit, and bottom when skin is broken out twice a day as needed 11/06/21  Yes [provider]    Physical Exam: BP (!) 156/89 (BP Location: Right Arm)   Pulse 87   Temp 98.3 F (36.8 C) (Oral)   Resp 20   SpO2 99%  General:  Alert, oriented, calm, in no acute distress, his wife is at the bedside.  He has stable slurred speech. Cardiovascular: RRR, no murmurs or rubs, no peripheral edema  Respiratory: clear to auscultation bilaterally, no wheezes, no crackles  Abdomen: soft, nontender, nondistended, normal bowel tones heard  Skin: dry, no rashes  Musculoskeletal: no joint effusions, normal range of motion  Psychiatric: appropriate affect, normal speech  Neurologic: extraocular muscles intact, baseline speech, chronic left-sided weakness         Labs on Admission:  Basic Metabolic Panel: Recent Labs  Lab 01/22/24 1332  NA 140  K 3.8  CL 103  CO2 28  GLUCOSE 153*  BUN 13  CREATININE 0.86  CALCIUM 9.2   Liver Function Tests: Recent Labs  Lab 01/22/24 1332  AST 17   ALT 13  ALKPHOS 68  BILITOT 0.5  PROT 7.2  ALBUMIN 3.5   Recent Labs  Lab 01/22/24 1332  LIPASE 23   No results for input(s): "AMMONIA" in the  last 168 hours. CBC: Recent Labs  Lab 01/22/24 1332  WBC 5.6  NEUTROABS 3.3  HGB 14.9  HCT 46.5  MCV 93.2  PLT 314   Cardiac Enzymes: No results for input(s): "CKTOTAL", "CKMB", "CKMBINDEX", "TROPONINI" in the last 168 hours. BNP (last 3 results) No results for input(s): "BNP" in the last 8760 hours.  ProBNP (last 3 results) No results for input(s): "PROBNP" in the last 8760 hours.  CBG: No results for input(s): "GLUCAP" in the last 168 hours.  Radiological Exams on Admission: DG Chest Port 1 View Result Date: 01/22/2024 CLINICAL DATA:  Fever EXAM: PORTABLE CHEST 1 VIEW COMPARISON:  09/15/2023 FINDINGS: The heart size and mediastinal contours are within normal limits. No focal airspace consolidation, pleural effusion, or pneumothorax. Advanced degenerative changes of both shoulders. IMPRESSION: No active disease. Electronically Signed   By: Duanne Guess D.O.   On: 01/22/2024 13:47   Assessment/Plan Robert [name] is a 78 y.o. male with medical history significant for CVA, BPH, hypertension, hyperlipidemia, DVT status post IVC, urinary retention with chronic Foley admitted to the hospital with Klebsiella ESBL UTI.   Klebsiella ESBL catheter associated UTI-patient with some low-grade fevers, intermittent altered mental status, mild diarrhea.  Wife also mentions intermittent dysuria.  Not septic, hemodynamically stable. -Inpatient admission -Empiric IV meropenem  GERD-omeprazole  History of CVA-continue aspirin, Eliquis, statin  Dementia-Namenda  Hyperlipidemia-Zocor  Parkinson's-continue Sinemet, pimavanserin, rasagiline  Type 2 diabetes-historically well-controlled, carb modified diet, moderate dose sliding scale.  Depression-continue BuSpar  History of DVT-continue home Eliquis     Code Status: Full  Code  Consults called: None  Admission status: The appropriate patient status for this patient is INPATIENT. Inpatient status is judged to be reasonable and necessary in order to provide the required intensity of service to ensure the patient's safety. The patient's presenting symptoms, physical exam findings, and initial radiographic and laboratory data in the context of their chronic comorbidities is felt to place them at high risk for further clinical deterioration. Furthermore, it is not anticipated that the patient will be medically stable for discharge from the hospital within 2 midnights of admission.    I certify that at the point of admission it is my clinical judgment that the patient will require inpatient hospital care spanning beyond 2 midnights from the point of admission due to high intensity of service, high risk for further deterioration and high frequency of surveillance required  Time spent: 56 minutes  Robert [name] Sharlette Dense MD [facility] Pager [phone]  If 7PM-7AM, please contact night-coverage [email or web address] Password [email or web address]  01/22/2024, 2:50 PM

## 2024-01-22 NOTE — Progress Notes (Addendum)
 Pharmacy Antibiotic Note  Robert [name] is a 78 y.o. male with hx CVA and chronic foley cath who was instructed to come to the ED on 01/22/24 for further workup and treatment of ESBL kleb pneumo from ucx on 01/18/24.  Pharmacy has been consulted to dose meropenem for UTI.   Today, 01/22/2024:  - scr 0.86 - wbc wnl   Plan: - meropenem 1gm q8h  ________________________________________  Temp (24hrs), Avg:98.3 F (36.8 C), Min:98.3 F (36.8 C), Max:98.3 F (36.8 C)  Recent Labs  Lab 01/22/24 1331 01/22/24 1332  WBC  --  5.6  CREATININE  --  0.86  LATICACIDVEN 1.8  --     CrCl cannot be calculated (Unknown ideal weight.).    Allergies  Allergen Reactions   Fluconazole Rash   Propofol Other (See Comments)    "hiccups for weeks"  Other reaction(s): Hiccups   Lisinopril Cough   Sulfamethoxazole-Trimethoprim Diarrhea    Thank you for allowing pharmacy to be a part of this patient's care.  Lucia Gaskins 01/22/2024 2:31 PM

## 2024-01-22 NOTE — ED Notes (Signed)
 ED TO INPATIENT HANDOFF REPORT  Name/Age/Gender Robert [name] 78 y.o. male  Code Status    Code Status Orders  (From admission, onward)           Start     Ordered   01/22/24 1450  Full code  Continuous       Question:  By:  Answer:  Consent: discussion documented in EHR   01/22/24 1450           Code Status History     Date Active Date Inactive Code Status Order ID Comments User Context   02/09/2022 0028 02/12/2022 1606 Full Code [id number]  Rometta Emery, MD ED   05/15/2021 1636 05/28/2021 1549 Full Code [id number]  Jacquelynn Cree, PA-C Inpatient   05/12/2021 1428 05/15/2021 1634 Full Code [id number]  Dara Lords, PA-C Inpatient   01/05/2021 0302 01/14/2021 0002 Full Code [id number]  Hugelmeyer, Alexis, DO ED   11/10/2020 0122 11/12/2020 2054 Full Code [id number]  Briscoe Deutscher, MD ED   08/13/2019 2349 08/19/2019 2132 Full Code [id number]  Charlsie Quest, MD ED   04/22/2017 1842 04/23/2017 1803 Full Code [id number]  Edsel Petrin, DO Inpatient       Home/SNF/Other Home  Chief Complaint Urinary tract infection due to ESBL Klebsiella [N39.0, B96.89]  Level of Care/Admitting Diagnosis ED Disposition     ED Disposition  Admit   Condition  --   Comment  Hospital Area: [facility] [[id number]]  Level of Care: Med-Surg [16]  May admit patient to [facility] or [facility] if equivalent level of care is available:: Yes  Covid Evaluation: Asymptomatic - no recent exposure (last 10 days) testing not required  Diagnosis: Urinary tract infection due to ESBL Klebsiella [723104]  Admitting Physician: Maryln Gottron [[id number]]  Attending Physician: Kirby Crigler, MIR [email or web address] [[id number]]  Certification:: I certify this patient will need inpatient services for at least 2 midnights  Expected Medical Readiness: 01/24/2024          Medical History Past Medical History:  Diagnosis Date   Anemia    BPH (benign prostatic hyperplasia)    CVA (cerebral vascular  accident) [facility])    with left sided hemiparesis   Diabetes mellitus without complication (HCC)    Diverticulosis    Frequency of urination    GERD (gastroesophageal reflux disease)    Gross hematuria    History of acute pyelonephritis    10-13-2012   History of CVA with residual deficit    2008--  left side of body weakness and foot drop (wears leg brace and uses cane)   History of DVT of lower extremity    2008--  cva   Hypercholesteremia    Hyperlipidemia    Hyperlipidemia    Hypertension    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 2008   Left leg DVT (HCC)    Neuromuscular disorder (HCC)    Parkinsons   S/P insertion of IVC (inferior vena caval) filter    2008   Urethral stricture    Urgency of urination    Urinary retention    Weakness of left side of body    secondary to cva 2008   Wears glasses    Wears hearing aid    bilateral-- wears intermittantly    Allergies Allergies  Allergen Reactions   Fluconazole Rash   Propofol Other (See Comments)    "hiccups for weeks"  Other reaction(s): Hiccups   Lisinopril Cough   Sulfamethoxazole-Trimethoprim Diarrhea  Fluoxetine Nausea Only and Other (See Comments)    Hallucinations, Feeling nervous, Nausea, Tachycardia    IV Location/Drains/Wounds Patient Lines/Drains/Airways Status     Active Line/Drains/Airways     Name Placement date Placement time Site Days   Peripheral IV 01/22/24 22 G Anterior;Left Forearm 01/22/24  1412  Forearm  less than 1   Urethral Catheter Billy M. RN Coude 20 Fr. 11/14/22  0857  Coude  434   Incision - 4 Ports Abdomen Umbilicus Upper;Mid Right;Lateral Right;Medial 01/09/21  1110  -- 1108   Pressure Injury 02/11/22 Buttocks Right;Left Stage 2 -  Partial thickness loss of dermis presenting as a shallow open injury with a red, pink wound bed without slough. top layer of skin toren 02/11/22  0745  -- 710   Wound / Incision (Open or Dehisced) 05/21/21 Other (Comment) Buttocks Left Excoriation  05/21/21  1037  Buttocks  976   Wound / Incision (Open or Dehisced) 05/21/21 Other (Comment) Buttocks Right excoriation 05/21/21  1036  Buttocks  976            Labs/Imaging Results for orders placed or performed during the hospital encounter of 01/22/24 (from the past 48 hours)  I-Stat Lactic Acid     Status: None   Collection Time: 01/22/24  1:31 PM  Result Value Ref Range   Lactic Acid, Venous 1.8 0.5 - 1.9 mmol/L  Comprehensive metabolic panel     Status: Abnormal   Collection Time: 01/22/24  1:32 PM  Result Value Ref Range   Sodium 140 135 - 145 mmol/L   Potassium 3.8 3.5 - 5.1 mmol/L   Chloride 103 98 - 111 mmol/L   CO2 28 22 - 32 mmol/L   Glucose, Bld 153 (H) 70 - 99 mg/dL    Comment: Glucose reference range applies only to samples taken after fasting for at least 8 hours.   BUN 13 8 - 23 mg/dL   Creatinine, Ser 4.09 0.61 - 1.24 mg/dL   Calcium 9.2 8.9 - 81.1 mg/dL   Total Protein 7.2 6.5 - 8.1 g/dL   Albumin 3.5 3.5 - 5.0 g/dL   AST 17 15 - 41 U/L   ALT 13 0 - 44 U/L   Alkaline Phosphatase 68 38 - 126 U/L   Total Bilirubin 0.5 0.0 - 1.2 mg/dL   GFR, Estimated >91 >47 mL/min    Comment: (NOTE) Calculated using the CKD-EPI Creatinine Equation (2021)    Anion gap 9 5 - 15    Comment: Performed at [facility], [address] [place], [place] [address]  CBC with Differential     Status: None   Collection Time: 01/22/24  1:32 PM  Result Value Ref Range   WBC 5.6 4.0 - 10.5 K/uL   RBC 4.99 4.22 - 5.81 MIL/uL   Hemoglobin 14.9 13.0 - 17.0 g/dL   HCT 21.3 08.6 - 57.8 %   MCV 93.2 80.0 - 100.0 fL   MCH 29.9 26.0 - 34.0 pg   MCHC 32.0 30.0 - 36.0 g/dL   RDW 46.9 62.9 - 52.8 %   Platelets 314 150 - 400 K/uL   nRBC 0.0 0.0 - 0.2 %   Neutrophils Relative % 60 %   Neutro Abs 3.3 1.7 - 7.7 K/uL   Lymphocytes Relative 21 %   Lymphs Abs 1.2 0.7 - 4.0 K/uL   Monocytes Relative 13 %   Monocytes Absolute 0.7 0.1 - 1.0 K/uL   Eosinophils Relative 5 %  Eosinophils Absolute 0.3 0.0 - 0.5 K/uL   Basophils Relative 1 %   Basophils Absolute 0.1 0.0 - 0.1 K/uL   Immature Granulocytes 0 %   Abs Immature Granulocytes 0.01 0.00 - 0.07 K/uL    Comment: Performed at [facility], [address], [place], [place] [address]  Lipase, blood     Status: None   Collection Time: 01/22/24  1:32 PM  Result Value Ref Range   Lipase 23 11 - 51 U/L    Comment: Performed at [facility], [address] [place], [place] [address]  Troponin I (High Sensitivity)     Status: None   Collection Time: 01/22/24  1:32 PM  Result Value Ref Range   Troponin I (High Sensitivity) 7 <18 ng/L    Comment: (NOTE) Elevated high sensitivity troponin I (hsTnI) values and significant  changes across serial measurements may suggest ACS but many other  chronic and acute conditions are known to elevate hsTnI results.  Refer to the "Links" section for chest pain algorithms and additional  guidance. Performed at [facility], [address] [place], [place] [address]    DG Chest Port 1 View Result Date: 01/22/2024 CLINICAL DATA:  Fever EXAM: PORTABLE CHEST 1 VIEW COMPARISON:  09/15/2023 FINDINGS: The heart size and mediastinal contours are within normal limits. No focal airspace consolidation, pleural effusion, or pneumothorax. Advanced degenerative changes of both shoulders. IMPRESSION: No active disease. Electronically Signed   By: Duanne Guess D.O.   On: 01/22/2024 13:47    Pending Labs Unresulted Labs (From admission, onward)     Start     Ordered   01/23/24 0500  Basic metabolic panel  Tomorrow morning,   R        01/22/24 1450   01/23/24 0500  CBC  Tomorrow morning,   R        01/22/24 1450   01/22/24 1502  Hemoglobin A1c  Once,   R       Comments: To assess prior glycemic control    01/22/24 1501   01/22/24 1319  Resp panel by RT-PCR (RSV, Flu A&B, Covid) Anterior Nasal Swab  Once,   URGENT         01/22/24 1318   01/22/24 1318  Urinalysis, w/ Reflex to Culture (Infection Suspected) -Urine, Catheterized; Indwelling urinary catheter  Once,   URGENT       Question Answer Comment  Specimen Source Urine, Catheterized   Specimen Source Indwelling urinary catheter      01/22/24 1318   01/22/24 1318  Culture, blood (routine x 2)  BLOOD CULTURE X 2,   R      01/22/24 1318            Vitals/Pain Today's Vitals   01/22/24 1242 01/22/24 1243 01/22/24 1524  BP: (!) 156/89    Pulse: 87    Resp: 20    Temp: 98.3 F (36.8 C)    TempSrc: Oral    SpO2: 99%    Weight:   106.6 kg  PainSc:  0-No pain     Isolation Precautions No active isolations  Medications Medications  meropenem (MERREM) 1 g in sodium chloride 0.9 % 100 mL IVPB (has no administration in time range)  acetaminophen (TYLENOL) tablet 650 mg (has no administration in time range)    Or  acetaminophen (TYLENOL) suppository 650 mg (has no administration in time range)  traZODone (DESYREL) tablet 25 mg (has no administration in time  range)  ondansetron (ZOFRAN) tablet 4 mg (has no administration in time range)    Or  ondansetron (ZOFRAN) injection 4 mg (has no administration in time range)  albuterol (PROVENTIL) (2.5 MG/3ML) 0.083% nebulizer solution 2.5 mg (has no administration in time range)  insulin aspart (novoLOG) injection 0-15 Units (has no administration in time range)  insulin aspart (novoLOG) injection 0-5 Units (has no administration in time range)  aspirin EC tablet 81 mg (has no administration in time range)  simvastatin (ZOCOR) tablet 20 mg (has no administration in time range)  memantine (NAMENDA) tablet 20 mg (has no administration in time range)  Pimavanserin Tartrate CAPS 34 mg (has no administration in time range)  pantoprazole (PROTONIX) EC tablet 40 mg (has no administration in time range)  oxybutynin (DITROPAN) tablet 5 mg (has no administration in time range)  apixaban (ELIQUIS) tablet 5 mg  (has no administration in time range)  rasagiline (AZILECT) tablet 1 mg (has no administration in time range)  busPIRone (BUSPAR) tablet 5-10 mg (has no administration in time range)  meropenem (MERREM) 1 g in sodium chloride 0.9 % 100 mL IVPB (0 g Intravenous Stopped 01/22/24 1552)    Mobility walks

## 2024-01-22 NOTE — ED Provider Notes (Signed)
 [place] EMERGENCY DEPARTMENT AT [facility] Provider Note   CSN: [id number] Arrival date & time: 01/22/24  1221     History {Add pertinent medical, surgical, social history, OB history to HPI:1} Chief Complaint  Patient presents with   Dysuria    Robert [name] is a 78 y.o. male.  He is brought in by his wife who is assisting with most of the history.  He follows with [facility].  He has had a prior stroke which is left him with left-sided deficits and is mostly bedbound.  He has a chronic Foley and had the Foley changed for 5 days ago.  They did a urine culture and were called today saying that he needs to be taken to the hospital because his urine culture was positive he needed IV antibiotics.  He suffers from frequent urinary tract infections.  He has had a catheter for 2 years.  He also has a sore on his buttock area that they have been watching.  He has had low-grade fevers and intermittent confusion and increased general weakness which is typical when he has an infection.  He denies any headache cough chest pain abdominal pain.  He does endorse a little bit of dysuria.  The history is provided by the patient and the spouse.  Dysuria Presenting symptoms: dysuria   Context: during urination and spontaneously   Relieved by:  Nothing Worsened by:  Nothing Associated symptoms: fever   Associated symptoms: no abdominal pain, no nausea and no vomiting   Risk factors: urinary catheter        Home Medications Prior to Admission medications   Medication Sig Start Date End Date Taking? Authorizing Provider  Acidophilus Lactobacillus CAPS See admin instructions.    [provider]  ACIDOPHILUS LACTOBACILLUS PO Take 1 tablet by mouth at bedtime. 02/03/22   [provider]  apixaban (ELIQUIS) 5 MG TABS tablet Take 1 tablet (5 mg total) by mouth 2 (two) times daily. 05/27/21   Love, Evlyn Kanner, PA-C  ascorbic acid (VITAMIN C) 500 MG tablet TAKE ONE TABLET BY  MOUTH DAILY FOR PREVENTION OF UTI 02/17/22   [provider]  aspirin EC 81 MG tablet Take 81 mg by mouth daily. Swallow whole.    [provider]  benzonatate (TESSALON) 100 MG capsule Take 100 mg by mouth 3 (three) times daily as needed for cough. Patient not taking: Reported on 03/16/2023 10/01/21   [provider]  Blood Glucose Monitoring Suppl (ONETOUCH VERIO FLEX SYSTEM) w/Device KIT check blood sugar 11/25/20   [provider]  Carbidopa-Levodopa ER (SINEMET CR) 25-100 MG tablet controlled release Take 2 1/2 tablets by mouth twice a day and take 1 tablet once a day with supper - taken at 0900, 1300, 1700 12/18/21   [provider]  cephALEXin (KEFLEX) 500 MG capsule TAKE ONE CAPSULE BY MOUTH TWICE A DAY FOR UTI 03/13/22   [provider]  Cholecalciferol (VITAMIN D3) 50 MCG (2000 UT) TABS 1 tablet    [provider]  Cholecalciferol 50 MCG (2000 UT) TABS Take 2,000 Units by mouth daily. 12/23/21   [provider]  feeding supplement (ENSURE ENLIVE / ENSURE PLUS) LIQD Take 237 mLs by mouth 2 (two) times daily between meals. 02/11/22   Rolly Salter, MD  finasteride (PROSCAR) 5 MG tablet Take 5 mg by mouth at bedtime. 01/07/22   [provider]  Fluocinolone Acetonide Body 0.01 % OIL Apply 1 application. topically See admin instructions.  Apply thin film to the scalp 1-2 times daily as needed for itching 11/06/21   [provider]  hydrOXYzine (ATARAX) 10 MG tablet Take 10 mg by mouth 3 (three) times daily as needed.    [provider]  memantine (NAMENDA) 10 MG tablet Take 20 mg by mouth at bedtime.    [provider]  metFORMIN (GLUCOPHAGE) 500 MG tablet Take 500 mg by mouth at bedtime.    [provider]  methenamine (HIPREX) 1 g tablet Take 1 g by mouth 2 (two) times daily. for infection prevention 02/03/22   [provider]  methenamine (HIPREX) 1 g tablet 1 tablet     [provider]  nystatin-triamcinolone ointment (MYCOLOG) Apply 1 application. topically See admin instructions. Apply thin film to groin and leg twice a day 10/26/21   [provider]  omeprazole (PRILOSEC) 20 MG capsule Take 20 mg by mouth daily.    [provider]  ondansetron (ZOFRAN-ODT) 4 MG disintegrating tablet Take 1 tablet (4 mg total) by mouth every 8 (eight) hours as needed for nausea or vomiting. Patient not taking: Reported on 03/16/2023 11/14/22   Renne Crigler, PA-C  Pimavanserin Tartrate (NUPLAZID) 34 MG CAPS Take 34 mg by mouth daily.    [provider]  polyethylene glycol (MIRALAX / GLYCOLAX) 17 g packet Take 17 g by mouth daily as needed for mild constipation. Patient not taking: Reported on 03/16/2023 05/12/17   [provider]  rasagiline (AZILECT) 1 MG TABS tablet Take 1 mg by mouth daily.    [provider]  simvastatin (ZOCOR) 40 MG tablet Take 40 mg by mouth at bedtime. 09/05/15   [provider]  tacrolimus (PROTOPIC) 0.1 % ointment Apply 1 application. topically See admin instructions. Apply to groin, armpit, and bottom when skin is broken out twice a day as needed 11/06/21   [provider]      Allergies    Fluconazole, Propofol, Lisinopril, and Sulfamethoxazole-trimethoprim    Review of Systems   Review of Systems  Constitutional:  Positive for fever.  Respiratory:  Negative for shortness of breath.   Cardiovascular:  Negative for chest pain.  Gastrointestinal:  Negative for abdominal pain, nausea and vomiting.  Genitourinary:  Positive for dysuria.  Neurological:  Negative for headaches.    Physical Exam Updated Vital Signs BP (!) 156/89 (BP Location: Right Arm)   Pulse 87   Temp 98.3 F (36.8 C) (Oral)   Resp 20   SpO2 99%  Physical Exam Vitals and nursing note reviewed.  Constitutional:      General: He is not in acute distress.    Appearance: Normal appearance. He is  well-developed.  HENT:     Head: Normocephalic and atraumatic.  Eyes:     Conjunctiva/sclera: Conjunctivae normal.  Cardiovascular:     Rate and Rhythm: Normal rate and regular rhythm.     Heart sounds: No murmur heard. Pulmonary:     Effort: Pulmonary effort is normal. No respiratory distress.     Breath sounds: Normal breath sounds.  Abdominal:     Palpations: Abdomen is soft.     Tenderness: There is no abdominal tenderness. There is no guarding or rebound.  Musculoskeletal:     Cervical back: Neck supple.  Skin:    General: Skin is warm and dry.     Capillary Refill: Capillary refill takes less than 2 seconds.     Comments: He has some breakdown in his intra buttock area with  a small ulcer.  Neurological:     Mental Status: He is alert.     Motor: Weakness present.     Comments: He has minimal use of his left arm and leg.  Left leg is in an orthotic brace.     ED Results / Procedures / Treatments   Labs (all labs ordered are listed, but only abnormal results are displayed) Labs Reviewed  CULTURE, BLOOD (ROUTINE X 2)  CULTURE, BLOOD (ROUTINE X 2)  RESP PANEL BY RT-PCR (RSV, FLU A&B, COVID)  RVPGX2  URINALYSIS, W/ REFLEX TO CULTURE (INFECTION SUSPECTED)  COMPREHENSIVE METABOLIC PANEL WITH GFR  CBC WITH DIFFERENTIAL/PLATELET  LIPASE, BLOOD  I-STAT CG4 LACTIC ACID, ED  TROPONIN I (HIGH SENSITIVITY)    EKG None  Radiology No results found.  Procedures Procedures  {Document cardiac monitor, telemetry assessment procedure when appropriate:1}  Medications Ordered in ED Medications - No data to display  ED Course/ Medical Decision Making/ A&P Clinical Course as of 01/22/24 1346  Sat Jan 22, 2024  1338 Received a fax from [place].  He had a urine culture sample sent on the 25th that is Klebsiella pneumonia ESBL with sensitivities to cefepime and imipenem only [MB]    Clinical Course User Index [MB] Terrilee Files, MD   {   Click here for ABCD2, HEART  and other calculatorsREFRESH Note before signing :1}                              Medical Decision Making Amount and/or Complexity of Data Reviewed Labs: ordered. Radiology: ordered.   This patient complains of ***; this involves an extensive number of treatment Options and is a complaint that carries with it a high risk of complications and morbidity. The differential includes ***  I ordered, reviewed and interpreted labs, which included *** I ordered medication *** and reviewed PMP when indicated. I ordered imaging studies which included *** and I independently    visualized and interpreted imaging which showed *** Additional history obtained from *** Previous records obtained and reviewed *** I consulted *** and discussed lab and imaging findings and discussed disposition.  Cardiac monitoring reviewed, *** Social determinants considered, *** Critical Interventions: ***  After the interventions stated above, I reevaluated the patient and found *** Admission and further testing considered, ***   {Document critical care time when appropriate:1} {Document review of labs and clinical decision tools ie heart score, Chads2Vasc2 etc:1}  {Document your independent review of radiology images, and any outside records:1} {Document your discussion with family members, caretakers, and with consultants:1} {Document social determinants of health affecting pt's care:1} {Document your decision making why or why not admission, treatments were needed:1} Final Clinical Impression(s) / ED Diagnoses Final diagnoses:  None    Rx / DC Orders ED Discharge Orders     None

## 2024-01-22 NOTE — ED Triage Notes (Signed)
 Pt BIB EMS from home, recently treated for UTI and current oral ABT are not working. MD office called pt and recommended ED visit. Pt w/c bound, foley cath, seen at [facility]. Hx of stroke, left side paralysis and slurred speech at baseline.   BP 164/100 P 93  SpO2 98% ra CBG 169

## 2024-01-23 DIAGNOSIS — B9689 Other specified bacterial agents as the cause of diseases classified elsewhere: Secondary | ICD-10-CM | POA: Diagnosis not present

## 2024-01-23 DIAGNOSIS — Z1612 Extended spectrum beta lactamase (ESBL) resistance: Secondary | ICD-10-CM

## 2024-01-23 DIAGNOSIS — N390 Urinary tract infection, site not specified: Secondary | ICD-10-CM | POA: Diagnosis not present

## 2024-01-23 LAB — GLUCOSE, CAPILLARY
Glucose-Capillary: 140 mg/dL — ABNORMAL HIGH (ref 70–99)
Glucose-Capillary: 143 mg/dL — ABNORMAL HIGH (ref 70–99)
Glucose-Capillary: 149 mg/dL — ABNORMAL HIGH (ref 70–99)
Glucose-Capillary: 146 mg/dL — ABNORMAL HIGH (ref 70–99)

## 2024-01-23 LAB — RESP PANEL BY RT-PCR (RSV, FLU A&B, COVID)  RVPGX2
Influenza A by PCR: NEGATIVE
Influenza B by PCR: NEGATIVE
Resp Syncytial Virus by PCR: NEGATIVE
SARS Coronavirus 2 by RT PCR: NEGATIVE

## 2024-01-23 LAB — CBC
HCT: 43.4 % (ref 39.0–52.0)
Hemoglobin: 13.8 g/dL (ref 13.0–17.0)
MCH: 29.9 pg (ref 26.0–34.0)
MCHC: 31.8 g/dL (ref 30.0–36.0)
MCV: 93.9 fL (ref 80.0–100.0)
Platelets: 299 10*3/uL (ref 150–400)
RBC: 4.62 MIL/uL (ref 4.22–5.81)
RDW: 13.4 % (ref 11.5–15.5)
WBC: 6.4 10*3/uL (ref 4.0–10.5)
nRBC: 0 % (ref 0.0–0.2)

## 2024-01-23 LAB — BASIC METABOLIC PANEL WITH GFR
Anion gap: 8 (ref 5–15)
BUN: 13 mg/dL (ref 8–23)
CO2: 25 mmol/L (ref 22–32)
Calcium: 8.8 mg/dL — ABNORMAL LOW (ref 8.9–10.3)
Chloride: 107 mmol/L (ref 98–111)
Creatinine, Ser: 1.03 mg/dL (ref 0.61–1.24)
GFR, Estimated: >60 mL/min (ref >60)
Glucose, Bld: 125 mg/dL — ABNORMAL HIGH (ref 70–99)
Potassium: 3.9 mmol/L (ref 3.5–5.1)
Sodium: 140 mmol/L (ref 135–145)

## 2024-01-23 MED ORDER — GERHARDT'S BUTT CREAM
TOPICAL_CREAM | Freq: Two times a day (BID) | CUTANEOUS | Status: DC
  Filled 2024-01-23: qty 60

## 2024-01-23 MED ORDER — CHLORHEXIDINE GLUCONATE CLOTH 2 % EX PADS
6.0000 | MEDICATED_PAD | Freq: Every day | CUTANEOUS | Status: DC
  Administered 2024-01-23 – 2024-01-27 (×5): 6 via TOPICAL

## 2024-01-23 NOTE — Progress Notes (Addendum)
 [facility]  PROGRESS NOTE  Robert [name] ZOX:[id number] DOB: [birth year] DOA: 01/22/2024 PCP: Noel Gerold, NP   Brief HPI:     78 y.o. male with medical history significant for CVA, BPH, hypertension, hyperlipidemia, DVT status post IVC, urinary retention with chronic Foley admitted to the hospital with Klebsiella ESBL UTI.  History is provided by the patient as well as his wife who is at the bedside.  They state he has essentially been in his usual state of health, though he has been having some low-grade fevers the last few days, and some loose stools.  Otherwise he has been doing pretty well, wife concurs that he has been at his baseline energy and mental status level.  On 3/24, he had his Foley catheter exchanged at home.  After the exchange, his wife obtained a urine sample from the new [facility]  She was called today and told that his urine culture was positive for infection, and that he needs to go to the ER for evaluation and management.  Per printed urine culture report, he has ESBL Klebsiella UTI.    Assessment/Plan:   Klebsiella ESBL catheter associated UTI -Presented with some low-grade fevers, intermittent altered mental status, mild diarrhea.  - Wife also mentions intermittent dysuria.  Not septic, hemodynamically stable. -Per urine culture report from [place] office, patient had ESBL Klebsiella UTI -Empiric IV meropenem   GERD-omeprazole   History of CVA-continue aspirin, Eliquis, statin   Dementia-Namenda   Hyperlipidemia-Zocor   Parkinson's-continue Sinemet, pimavanserin, rasagiline   Type 2 diabetes-historically well-controlled, carb modified diet, moderate dose sliding scale.   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    Medications     apixaban  5 mg Oral BID   aspirin EC  81 mg Oral Daily   busPIRone  10 mg Oral q AM   And   busPIRone  5 mg Oral BID   Carbidopa-Levodopa ER  3 tablet  Oral BID   And   Carbidopa-Levodopa ER  2 tablet Oral QHS   insulin aspart  0-15 Units Subcutaneous TID WC   insulin aspart  0-5 Units Subcutaneous QHS   memantine  20 mg Oral QHS   oxybutynin  5 mg Oral BID   pantoprazole  40 mg Oral Daily   Pimavanserin Tartrate  34 mg Oral Daily   rasagiline  1 mg Oral Daily   simvastatin  20 mg Oral q1800     Data Reviewed:   CBG:  Recent Labs  Lab 01/22/24 1727 01/22/24 2041 01/23/24 0737  GLUCAP 125* 132* 140*    SpO2: 99 %    Vitals:   01/22/24 2039 01/23/24 0039 01/23/24 0144 01/23/24 0504  BP: (!) 145/78 (!) 161/87  (!) 157/89  Pulse: 83 86  85  Resp: 20 15  16   Temp: 98.5 F (36.9 C) 98.3 F (36.8 C)  98 F (36.7 C)  TempSrc:      SpO2: 98% 97%  99%  Weight:   99 kg   Height:   6\' 2"  (1.88 m)       Data Reviewed:  Basic Metabolic Panel: Recent Labs  Lab 01/22/24 1332 01/23/24 0555  NA 140 140  K 3.8 3.9  CL 103 107  CO2 28 25  GLUCOSE 153* 125*  BUN 13 13  CREATININE 0.86 1.03  CALCIUM 9.2 8.8*    CBC: Recent Labs  Lab 01/22/24 1332 01/23/24 0555  WBC 5.6 6.4  NEUTROABS  3.3  --   HGB 14.9 13.8  HCT 46.5 43.4  MCV 93.2 93.9  PLT 314 299    LFT Recent Labs  Lab 01/22/24 1332  AST 17  ALT 13  ALKPHOS 68  BILITOT 0.5  PROT 7.2  ALBUMIN 3.5     Antibiotics: Anti-infectives (From admission, onward)    Start     Dose/Rate Route Frequency Ordered Stop   01/22/24 2300  meropenem (MERREM) 1 g in sodium chloride 0.9 % 100 mL IVPB        1 g 200 mL/hr over 30 Minutes Intravenous Every 8 hours 01/22/24 1439     01/22/24 1430  meropenem (MERREM) 1 g in sodium chloride 0.9 % 100 mL IVPB        1 g 200 mL/hr over 30 Minutes Intravenous  Once 01/22/24 1425 01/22/24 1552        DVT prophylaxis: Apixaban  Code Status: Full code  Family Communication: Discussed with patient's wife at bedside   CONSULTS    Subjective   Denies nausea vomiting or diarrhea.   Objective     Physical Examination:   General-appears in no acute distress Heart-S1-S2, regular, no murmur auscultated Lungs-clear to auscultation bilaterally, no wheezing or crackles auscultated Abdomen-soft, nontender, no organomegaly Extremities-no edema in the lower extremities Neuro-alert, oriented x3, no focal deficit noted   Status is: Inpatient:      Pressure Injury 02/11/22 Buttocks Right;Left Stage 2 -  Partial thickness loss of dermis presenting as a shallow open injury with a red, pink wound bed without slough. top layer of skin toren (Active)  02/11/22 0745  Location: Buttocks  Location Orientation: Right;Left  Staging: Stage 2 -  Partial thickness loss of dermis presenting as a shallow open injury with a red, pink wound bed without slough.  Wound Description (Comments): top layer of skin toren  Present on Admission: Yes (according to spouse)        Meredeth Ide   [facility] If 7PM-7AM, please contact night-coverage at [email or web address], Office  [phone]   01/23/2024, 8:39 AM  LOS: 1 day

## 2024-01-23 NOTE — Consult Note (Signed)
 WOC Nurse Consult Note: Reason for Consult: sacral wound  Wound type: Stage 2 Pressure Injury sacrum/bilateral medial buttocks  Pressure Injury POA: Yes Measurement: see nursing flowsheet  Wound bed: red moist  Drainage (amount, consistency, odor) sanguinous  Periwound: peeling epithelium  Dressing procedure/placement/frequency:  Cleanse buttocks/sacrum with Vashe wound cleanser Hart [place] [phone]), apply Xeroform gauze [place] [phone]) to wound beds daily.  Apply Gerhardt's Butt Cream to surrounding intact skin. Cover with silicone foam or ABD pad whichever is preferred.   POC discussed with bedside nurse.  WOC team will not follow.  Re-consult if further needs arise.   Thank you,    Priscella Mann MSN, RN-BC, [employer] [phone]

## 2024-01-24 DIAGNOSIS — N390 Urinary tract infection, site not specified: Secondary | ICD-10-CM | POA: Diagnosis not present

## 2024-01-24 DIAGNOSIS — B9689 Other specified bacterial agents as the cause of diseases classified elsewhere: Secondary | ICD-10-CM | POA: Diagnosis not present

## 2024-01-24 LAB — COMPREHENSIVE METABOLIC PANEL WITH GFR
ALT: 7 U/L (ref 0–44)
AST: 14 U/L — ABNORMAL LOW (ref 15–41)
Albumin: 3 g/dL — ABNORMAL LOW (ref 3.5–5.0)
Alkaline Phosphatase: 58 U/L (ref 38–126)
Anion gap: 7 (ref 5–15)
BUN: 17 mg/dL (ref 8–23)
CO2: 25 mmol/L (ref 22–32)
Calcium: 8.7 mg/dL — ABNORMAL LOW (ref 8.9–10.3)
Chloride: 106 mmol/L (ref 98–111)
Creatinine, Ser: 0.99 mg/dL (ref 0.61–1.24)
GFR, Estimated: >60 mL/min (ref >60)
Glucose, Bld: 148 mg/dL — ABNORMAL HIGH (ref 70–99)
Potassium: 4 mmol/L (ref 3.5–5.1)
Sodium: 138 mmol/L (ref 135–145)
Total Bilirubin: 0.6 mg/dL (ref 0.0–1.2)
Total Protein: 6.2 g/dL — ABNORMAL LOW (ref 6.5–8.1)

## 2024-01-24 LAB — CBC
HCT: 43.8 % (ref 39.0–52.0)
Hemoglobin: 13.7 g/dL (ref 13.0–17.0)
MCH: 29.9 pg (ref 26.0–34.0)
MCHC: 31.3 g/dL (ref 30.0–36.0)
MCV: 95.6 fL (ref 80.0–100.0)
Platelets: 279 10*3/uL (ref 150–400)
RBC: 4.58 MIL/uL (ref 4.22–5.81)
RDW: 13.5 % (ref 11.5–15.5)
WBC: 6 10*3/uL (ref 4.0–10.5)
nRBC: 0 % (ref 0.0–0.2)

## 2024-01-24 LAB — URINE CULTURE: Culture: >=100000 — AB

## 2024-01-24 LAB — GLUCOSE, CAPILLARY
Glucose-Capillary: 161 mg/dL — ABNORMAL HIGH (ref 70–99)
Glucose-Capillary: 119 mg/dL — ABNORMAL HIGH (ref 70–99)
Glucose-Capillary: 199 mg/dL — ABNORMAL HIGH (ref 70–99)
Glucose-Capillary: 92 mg/dL (ref 70–99)

## 2024-01-24 NOTE — Evaluation (Signed)
 Occupational Therapy Evaluation Patient Details Name: Robert [name] MRN: [id number] DOB: 10-11-1946 Today's Date: 01/24/2024   History of Present Illness   Pt is a 78 y.o. male admitted with dx of UTI. PMH: GERD, CVA, Dementia, Hyperlipidemia, PD, DM2, Depression, Depression, DVD     Clinical Impressions PTA, patient lives with wife and has hired PCA services 6 days per week, HH SN, PT and OT working with him. Wife and regular home PCA were present for OT evaluation to assist with history, home DME and baseline function including L hemiparesis and effects of PD leaving him non-ambulatory for some years. Patient uses a STEDY and transport w/c at home, has all needed DME, L AFO, L hand orthoses, and Foley catheter for urine management. Patient presents to OT this am with L sided hemiparesis, postural weakness, balance and activity tolerance deficits, cognitive and visual deficits impacting BADL's and all functional mobility. Patient's PCA worked with OT to transfer pt to recliner with STEDY and discussion for follow up therapy needs with wife and PCA as well as patient. Patient will require continued Acute level OT services to maximize function and OT recommendation for [facility] services in home with PCA and family assistance.      If plan is discharge home, recommend the following:   Two people to help with walking and/or transfers;A lot of help with bathing/dressing/bathroom;Assistance with cooking/housework;Assistance with feeding;Direct supervision/assist for medications management;Direct supervision/assist for financial management;Assist for transportation;Help with stairs or ramp for entrance;Supervision due to cognitive status     Functional Status Assessment   Patient has had a recent decline in their functional status and demonstrates the ability to make significant improvements in function in a reasonable and predictable amount of time.     Equipment Recommendations    (adapted  cup)      Precautions/Restrictions   Precautions Precautions: Fall Required Braces or Orthoses: Splint/Cast;Other Brace (uses L AFO, L UE palm protector and wrist brace) Restrictions Weight Bearing Restrictions Per Provider Order: No     Mobility Bed Mobility Overal bed mobility: Needs Assistance Bed Mobility: Rolling, Supine to Sit Rolling: Max assist, Used rails   Supine to sit: Max assist, +2 for physical assistance, +2 for safety/equipment, HOB elevated, Used rails          Transfers Overall transfer level: Needs assistance Equipment used:  (use of STEDY) Transfers: Sit to/from Stand, Bed to chair/wheelchair/BSC Sit to Stand: From elevated surface, Via lift equipment, Max assist, +2 physical assistance, +2 safety/equipment           General transfer comment: used AFO on L Le for added support Transfer via Lift Equipment: Stedy    Balance Overall balance assessment: Needs assistance Sitting-balance support: Single extremity supported, Feet supported Sitting balance-Leahy Scale: Poor   Postural control: Posterior lean, Left lateral lean Standing balance support: Reliant on assistive device for balance Standing balance-Leahy Scale: Poor Standing balance comment: use of STEDY                           ADL either performed or assessed with clinical judgement   ADL Overall ADL's : Needs assistance/impaired Eating/Feeding: Minimal assistance;With adaptive utensils Eating/Feeding Details (indicate cue type and reason): assist to set up, would benefit from adapted cup and weighted utensils are on order Grooming: Wash/dry hands;Wash/dry face;Oral care;Applying deodorant;Moderate assistance;Sitting;Bed level   Upper Body Bathing: Moderate assistance;Sitting;Bed level   Lower Body Bathing: Total assistance;Sitting/lateral leans;Bed level (use of STEDY)  Upper Body Dressing : Moderate assistance;Sitting   Lower Body Dressing: Total assistance;Bed  level   Toilet Transfer:  (uses Foley catheter and bed pad/pan for BM- wife reports they no longer use toileting DME)           Functional mobility during ADLs: +2 for physical assistance;+2 for safety/equipment;Maximal assistance (STEDY) General ADL Comments: L hemiparesis with PD imacting function with baseline need for pca assist at home     Vision Baseline Vision/History: 4 Cataracts Ability to See in Adequate Light: 1 Impaired Patient Visual Report: Blurring of vision Vision Assessment?: Yes Additional Comments: able to see clinician's name badge and clock, some difficulty reading chanels and info on TV but is at baseline as per pt and cg report     Perception Perception: Impaired Preception Impairment Details: Spatial orientation     Praxis Praxis: Impaired Praxis Impairment Details: Motor planning (pt with hx of CVA and PD)     Pertinent Vitals/Pain Pain Assessment Pain Assessment: No/denies pain     Extremity/Trunk Assessment Upper Extremity Assessment Upper Extremity Assessment: Right hand dominant;LUE deficits/detail;Generalized weakness LUE Deficits / Details: L hemiparesis with tendency for hypertonicity and flexor tone in palm LUE Sensation: decreased light touch LUE Coordination: decreased fine motor;decreased gross motor   Lower Extremity Assessment Lower Extremity Assessment: Generalized weakness;LLE deficits/detail LLE Deficits / Details: uses L LE AFO for support due to hemiparesis   Cervical / Trunk Assessment Cervical / Trunk Assessment: Normal   Communication Communication Communication: Impaired Factors Affecting Communication: Hearing impaired   Cognition Arousal: Alert Behavior During Therapy: WFL for tasks assessed/performed Cognition: History of cognitive impairments             OT - Cognition Comments: wife and PCA as well as patient feel patient is close to baseline with cognitive status, cues for exact date and slower processing  with HOH needing repetition                 Following commands: Impaired Following commands impaired: Only follows one step commands consistently     Cueing  General Comments   Cueing Techniques: Verbal cues  has sacral protection pad for pressure relief, minor r side intention tremor           Home Living Family/patient expects to be discharged to:: Private residence Living Arrangements: Spouse/significant other Available Help at Discharge: Family;Personal care attendant (has pca days per week and HH therapy and SN for Foley management) Type of Home: Apartment Home Access: Level entry     Home Layout: One level     Bathroom Shower/Tub: Tub/shower unit;Curtain   Firefighter: Handicapped height Bathroom Accessibility: Yes How Accessible: Accessible via wheelchair;Accessible via walker Home Equipment: Grab bars - tub/shower;Hand held shower head;Transport chair;Hospital bed (hemi walker)   Additional Comments: STEDY lift for transfers, SPTR to and from SUV elevated seat, adaptive utensils are on order      Prior Functioning/Environment Prior Level of Function : Needs assist  Cognitive Assist : Mobility (cognitive);ADLs (cognitive) Mobility (Cognitive): Intermittent cues ADLs (Cognitive): Intermittent cues Physical Assist : Mobility (physical);ADLs (physical) Mobility (physical): Bed mobility;Transfers ADLs (physical): Feeding;Grooming;Bathing;Dressing;Toileting;IADLs Mobility Comments: non-ambulatory for years, PCA and wife provide all transfer assist ADLs Comments: Assist with all ADL's 6 days    OT Problem List: Decreased strength;Decreased range of motion;Decreased activity tolerance;Impaired balance (sitting and/or standing);Impaired vision/perception;Decreased coordination;Decreased cognition;Decreased safety awareness;Decreased knowledge of use of DME or AE;Decreased knowledge of precautions;Cardiopulmonary status limiting activity;Impaired  sensation;Impaired tone;Impaired UE functional use  OT Treatment/Interventions: Self-care/ADL training;Therapeutic exercise;Neuromuscular education;Energy conservation;DME and/or AE instruction;Manual therapy;Splinting;Therapeutic activities;Cognitive remediation/compensation;Visual/perceptual remediation/compensation;Patient/family education;Balance training      OT Goals(Current goals can be found in the care plan section)   Acute Rehab OT Goals Patient Stated Goal: to keep him moving to not lose ground OT Goal Formulation: With patient/family Time For Goal Achievement: 02/07/24 Potential to Achieve Goals: Good ADL Goals Pt Will Perform Grooming: with set-up;sitting Pt Will Perform Upper Body Bathing: with contact guard assist;sitting Pt Will Perform Upper Body Dressing: with min assist;sitting Additional ADL Goal #1: Patient's caregivers will be indepenedent with L UE brace application and all use of AD/DME in preparation for discharge back home   OT Frequency:  Min 2X/week       AM-PAC OT "6 Clicks" Daily Activity     Outcome Measure Help from another person eating meals?: A Little Help from another person taking care of personal grooming?: A Little Help from another person toileting, which includes using toliet, bedpan, or urinal?: Total Help from another person bathing (including washing, rinsing, drying)?: Total Help from another person to put on and taking off regular upper body clothing?: A Lot Help from another person to put on and taking off regular lower body clothing?: Total 6 Click Score: 11   End of Session Equipment Utilized During Treatment: Gait belt (STEDY, L AFO) Nurse Communication: Mobility status;Need for lift equipment;Precautions  Activity Tolerance: Patient tolerated treatment well Patient left: in chair;with chair alarm set;with call bell/phone within reach;with nursing/sitter in room;with family/visitor present  OT Visit Diagnosis: Unsteadiness on  feet (R26.81);Other abnormalities of gait and mobility (R26.89);Muscle weakness (generalized) (M62.81);Other symptoms and signs involving the nervous system (R29.898);Cognitive communication deficit (R41.841);Hemiplegia and hemiparesis Symptoms and signs involving cognitive functions: Cerebral infarction Hemiplegia - Right/Left: Left Hemiplegia - dominant/non-dominant: Non-Dominant Hemiplegia - caused by: Cerebral infarction                Time: 0910-0958 OT Time Calculation (min): 48 min Charges:  OT General Charges $OT Visit: 1 Visit OT Evaluation $OT Eval Moderate Complexity: 1 Mod OT Treatments $Self Care/Home Management : 23-37 mins Pearse Shiffler OT/L Acute Rehabilitation Department  [phone]  01/24/2024, 11:28 AM

## 2024-01-24 NOTE — Evaluation (Signed)
 Physical Therapy Evaluation Patient Details Name: Robert [name] MRN: [id number] DOB: 1945/12/12 Today's Date: 01/24/2024  History of Present Illness  Pt is a 78 y.o. male admitted with dx of UTI. PMH: CVA with L side deficits, dementia,Parkinson's, DM, depression, sacral wound, DVT, chronic foley, IVC filter  Clinical Impression  Chair evaluation at bedside. Pt recently assisted over to recliner using STEDY with OT and PCA-reports required +2 assist. Pt participated with active LE exercises for R LE and AA for L LE. He tolerated exercises well. Caregiver wished for pt to remain in recliner. Will plan to follow and progress activity as safely able. Recommend HHPT f/u.         If plan is discharge home, recommend the following: A lot of help with walking and/or transfers;A lot of help with bathing/dressing/bathroom   Can travel by private vehicle        Equipment Recommendations None recommended by PT  Recommendations for Other Services       Functional Status Assessment Patient has had a recent decline in their functional status and demonstrates the ability to make significant improvements in function in a reasonable and predictable amount of time.     Precautions / Restrictions Precautions Precautions: Fall Required Braces or Orthoses:  (L AFO, L palm protector, L wrist brace) Restrictions Weight Bearing Restrictions Per Provider Order: No      Mobility  Bed Mobility               General bed mobility comments: NT-pt sitting in recliner    Transfers                   General transfer comment: NT-just recently transferred to recliner with OT/PCA assistance-wished to remain    Ambulation/Gait                  Stairs            Wheelchair Mobility     Tilt Bed    Modified Rankin (Stroke Patients Only)       Balance                                             Pertinent Vitals/Pain Pain Assessment Pain  Assessment: No/denies pain    Home Living Family/patient expects to be discharged to:: Private residence Living Arrangements: Spouse/significant other Available Help at Discharge: Family;Personal care attendant Type of Home: Apartment Home Access: Level entry       Home Layout: One level Home Equipment: Grab bars - tub/shower;Hand held shower head;Transport chair;Hospital bed (STEDY, hemi-walker)      Prior Function Prior Level of Function : Needs assist         Mobility (physical): Bed mobility;Transfers   Mobility Comments: non-ambulatory. STEDY transfers with wife/PCA ADLs Comments: Assist with all ADLs     Extremity/Trunk Assessment   Upper Extremity Assessment Upper Extremity Assessment: Defer to OT evaluation    Lower Extremity Assessment Lower Extremity Assessment:  (R LE at least 3/5 throughout) LLE Deficits / Details: wears L AFO. Knee ext ~3-/5 with MMT. Impaired coordination       Communication   Communication Communication: Impaired Factors Affecting Communication: Hearing impaired    Cognition Arousal: Alert Behavior During Therapy: WFL for tasks assessed/performed   PT - Cognitive impairments: History of cognitive impairments  Following commands: Impaired Following commands impaired: Only follows one step commands consistently     Cueing Cueing Techniques: Verbal cues, Tactile cues     General Comments      Exercises General Exercises - Lower Extremity Long Arc Quad: AROM, Right, 10 reps, Seated (AA L 10 reps, seated) Hip Flexion/Marching: AROM, Right, 10 reps, Seated   Assessment/Plan    PT Assessment Patient needs continued PT services  PT Problem List Decreased strength;Decreased range of motion;Decreased activity tolerance;Decreased balance;Decreased mobility;Decreased knowledge of use of DME;Decreased cognition;Decreased skin integrity;Impaired tone;Impaired sensation;Decreased coordination        PT Treatment Interventions DME instruction;Functional mobility training;Therapeutic activities;Therapeutic exercise;Patient/family education;Balance training    PT Goals (Current goals can be found in the Care Plan section)  Acute Rehab PT Goals Patient Stated Goal: retun home and resume home health services PT Goal Formulation:  (with caregiver/PCA) Time For Goal Achievement: 02/07/24 Potential to Achieve Goals: Fair    Frequency Min 2X/week     Co-evaluation               AM-PAC PT "6 Clicks" Mobility  Outcome Measure Help needed turning from your back to your side while in a flat bed without using bedrails?: Total Help needed moving from lying on your back to sitting on the side of a flat bed without using bedrails?: Total Help needed moving to and from a bed to a chair (including a wheelchair)?: Total Help needed standing up from a chair using your arms (e.g., wheelchair or bedside chair)?: Total Help needed to walk in hospital room?: Total Help needed climbing 3-5 steps with a railing? : Total 6 Click Score: 6    End of Session   Activity Tolerance: Patient tolerated treatment well Patient left: in chair;with call bell/phone within reach;with family/visitor present        Time: 4098-1191 PT Time Calculation (min) (ACUTE ONLY): 10 min   Charges:   PT Evaluation $PT Eval Low Complexity: 1 Low   PT General Charges $$ ACUTE PT VISIT: 1 Visit            Faye Ramsay, PT Acute Rehabilitation  Office: [phone]

## 2024-01-24 NOTE — Progress Notes (Signed)
 [facility]  PROGRESS NOTE  Robert [name] ZOX:[id number] DOB: 01-04-1946 DOA: 01/22/2024 PCP: Noel Gerold, NP   Brief HPI:     78 y.o. male with medical history significant for CVA, BPH, hypertension, hyperlipidemia, DVT status post IVC, urinary retention with chronic Foley admitted to the hospital with Klebsiella ESBL UTI.  History is provided by the patient as well as his wife who is at the bedside.  They state he has essentially been in his usual state of health, though he has been having some low-grade fevers the last few days, and some loose stools.  Otherwise he has been doing pretty well, wife concurs that he has been at his baseline energy and mental status level.  On 3/24, he had his Foley catheter exchanged at home.  After the exchange, his wife obtained a urine sample from the new [facility]  She was called today and told that his urine culture was positive for infection, and that he needs to go to the ER for evaluation and management.  Per printed urine culture report, he has ESBL Klebsiella UTI.    Assessment/Plan:   Klebsiella ESBL catheter associated UTI -Presented with some low-grade fevers, intermittent altered mental status, mild diarrhea.  - Wife also mentions intermittent dysuria.  Not septic, hemodynamically stable. -Per urine culture report from [place] office, patient had ESBL Klebsiella UTI -Empiric IV meropenem -Will try to obtain records from [place] office today.   GERD-continue pantoprazole   History of CVA-continue aspirin, Eliquis, statin   Dementia-Namenda   Hyperlipidemia-Zocor   Parkinson's-continue Sinemet, pimavanserin, rasagiline   Type 2 diabetes-historically well-controlled, carb modified diet, moderate dose sliding scale.   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    Medications     apixaban  5 mg Oral BID   aspirin EC  81 mg Oral Daily   busPIRone  10 mg Oral q AM   And    busPIRone  5 mg Oral BID   Carbidopa-Levodopa ER  3 tablet Oral BID   And   Carbidopa-Levodopa ER  2 tablet Oral QHS   Chlorhexidine Gluconate Cloth  6 each Topical Daily   Gerhardt's butt cream   Topical BID   insulin aspart  0-15 Units Subcutaneous TID WC   insulin aspart  0-5 Units Subcutaneous QHS   memantine  20 mg Oral QHS   oxybutynin  5 mg Oral BID   pantoprazole  40 mg Oral Daily   Pimavanserin Tartrate  34 mg Oral Daily   rasagiline  1 mg Oral Daily   simvastatin  20 mg Oral q1800     Data Reviewed:   CBG:  Recent Labs  Lab 01/23/24 0737 01/23/24 1140 01/23/24 1627 01/23/24 2229 01/24/24 0741  GLUCAP 140* 143* 149* 146* 161*    SpO2: 96 %    Vitals:   01/23/24 0504 01/23/24 1143 01/23/24 2235 01/24/24 0626  BP: (!) 157/89 (!) 142/84 122/67 133/76  Pulse: 85 93 74 72  Resp: 16 (!) 22 15 15   Temp: 98 F (36.7 C) 99.1 F (37.3 C) 99.6 F (37.6 C) 98.1 F (36.7 C)  TempSrc:  Oral Oral   SpO2: 99% 98% 98% 96%  Weight:      Height:          Data Reviewed:  Basic Metabolic Panel: Recent Labs  Lab 01/22/24 1332 01/23/24 0555 01/24/24 0441  NA 140 140 138  K 3.8 3.9 4.0  CL 103 107  106  CO2 28 25 25   GLUCOSE 153* 125* 148*  BUN 13 13 17   CREATININE 0.86 1.03 0.99  CALCIUM 9.2 8.8* 8.7*    CBC: Recent Labs  Lab 01/22/24 1332 01/23/24 0555 01/24/24 0441  WBC 5.6 6.4 6.0  NEUTROABS 3.3  --   --   HGB 14.9 13.8 13.7  HCT 46.5 43.4 43.8  MCV 93.2 93.9 95.6  PLT 314 299 279    LFT Recent Labs  Lab 01/22/24 1332 01/24/24 0441  AST 17 14*  ALT 13 7  ALKPHOS 68 58  BILITOT 0.5 0.6  PROT 7.2 6.2*  ALBUMIN 3.5 3.0*     Antibiotics: Anti-infectives (From admission, onward)    Start     Dose/Rate Route Frequency Ordered Stop   01/22/24 2300  meropenem (MERREM) 1 g in sodium chloride 0.9 % 100 mL IVPB        1 g 200 mL/hr over 30 Minutes Intravenous Every 8 hours 01/22/24 1439     01/22/24 1430  meropenem (MERREM) 1 g in sodium  chloride 0.9 % 100 mL IVPB        1 g 200 mL/hr over 30 Minutes Intravenous  Once 01/22/24 1425 01/22/24 1552        DVT prophylaxis: Apixaban  Code Status: Full code  Family Communication: Discussed with patient's wife at bedside   CONSULTS    Subjective   Denies any complaints   Objective    Physical Examination:  General-appears in no acute distress Heart-S1-S2, regular, no murmur auscultated Lungs-clear to auscultation bilaterally, no wheezing or crackles auscultated Abdomen-soft, nontender, no organomegaly Extremities-no edema in the lower extremities Neuro-alert, oriented x3, no focal deficit noted   Status is: Inpatient:      Pressure Injury 02/11/22 Buttocks Right;Left Stage 2 -  Partial thickness loss of dermis presenting as a shallow open injury with a red, pink wound bed without slough. top layer of skin toren (Active)  02/11/22 0745  Location: Buttocks  Location Orientation: Right;Left  Staging: Stage 2 -  Partial thickness loss of dermis presenting as a shallow open injury with a red, pink wound bed without slough.  Wound Description (Comments): top layer of skin toren  Present on Admission: Yes (according to spouse)        Meredeth Ide   [facility] If 7PM-7AM, please contact night-coverage at [email or web address], Office  [phone]   01/24/2024, 7:51 AM  LOS: 2 days

## 2024-01-25 DIAGNOSIS — N390 Urinary tract infection, site not specified: Secondary | ICD-10-CM | POA: Diagnosis not present

## 2024-01-25 DIAGNOSIS — B9689 Other specified bacterial agents as the cause of diseases classified elsewhere: Secondary | ICD-10-CM | POA: Diagnosis not present

## 2024-01-25 DIAGNOSIS — Z1612 Extended spectrum beta lactamase (ESBL) resistance: Secondary | ICD-10-CM | POA: Diagnosis not present

## 2024-01-25 LAB — GLUCOSE, CAPILLARY
Glucose-Capillary: 152 mg/dL — ABNORMAL HIGH (ref 70–99)
Glucose-Capillary: 131 mg/dL — ABNORMAL HIGH (ref 70–99)
Glucose-Capillary: 140 mg/dL — ABNORMAL HIGH (ref 70–99)
Glucose-Capillary: 154 mg/dL — ABNORMAL HIGH (ref 70–99)

## 2024-01-25 NOTE — Progress Notes (Signed)
 [facility]  PROGRESS NOTE  Robert [name] ZOX:[id number] DOB: 07/05/1946 DOA: 01/22/2024 PCP: Noel Gerold, NP   Brief HPI:     78 y.o. male with medical history significant for CVA, BPH, hypertension, hyperlipidemia, DVT status post IVC, urinary retention with chronic Foley admitted to the hospital with Klebsiella ESBL UTI.  History is provided by the patient as well as his wife who is at the bedside.  They state he has essentially been in his usual state of health, though he has been having some low-grade fevers the last few days, and some loose stools.  Otherwise he has been doing pretty well, wife concurs that he has been at his baseline energy and mental status level.  On 3/24, he had his Foley catheter exchanged at home.  After the exchange, his wife obtained a urine sample from the new catheter and [facility]  She was called today and told that his urine culture was positive for infection, and that he needs to go to the ER for evaluation and management.  Per printed urine culture report, he has ESBL Klebsiella UTI.    Assessment/Plan:   Klebsiella ESBL catheter associated UTI -Presented with some low-grade fevers, intermittent altered mental status, mild diarrhea.  - Wife also mentions intermittent dysuria.  Not septic, hemodynamically stable. -Per urine culture report from [place] office, patient had ESBL Klebsiella UTI -Empiric IV meropenem -Records from [place] shows that he does have Klebsiella ESBL UTI -Plan to treat for 5 days with IV meropenem. -Can be discharged home after completing 5 days of treatment.  Likely on Thursday or early Friday.   GERD-continue pantoprazole   History of CVA-continue aspirin, Eliquis, statin   Dementia-Namenda   Hyperlipidemia-Zocor   Parkinson's-continue Sinemet, pimavanserin, rasagiline   Type 2 diabetes-historically well-controlled, carb modified diet, moderate dose sliding scale. 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    Medications     apixaban  5 mg Oral BID   aspirin EC  81 mg Oral Daily   busPIRone  10 mg Oral q AM   And   busPIRone  5 mg Oral BID   Carbidopa-Levodopa ER  3 tablet Oral BID   And   Carbidopa-Levodopa ER  2 tablet Oral QHS   Chlorhexidine Gluconate Cloth  6 each Topical Daily   Gerhardt's butt cream   Topical BID   insulin aspart  0-15 Units Subcutaneous TID WC   insulin aspart  0-5 Units Subcutaneous QHS   memantine  20 mg Oral QHS   oxybutynin  5 mg Oral BID   pantoprazole  40 mg Oral Daily   Pimavanserin Tartrate  34 mg Oral Daily   rasagiline  1 mg Oral Daily   simvastatin  20 mg Oral q1800     Data Reviewed:   CBG:  Recent Labs  Lab 01/24/24 0741 01/24/24 1128 01/24/24 1628 01/24/24 2038 01/25/24 0729  GLUCAP 161* 119* 199* 92 152*    SpO2: 100 %    Vitals:   01/23/24 1143 01/23/24 2235 01/24/24 0626 01/24/24 1357  BP: (!) 142/84 122/67 133/76 127/73  Pulse: 93 74 72 78  Resp: (!) 22 15 15    Temp: 99.1 F (37.3 C) 99.6 F (37.6 C) 98.1 F (36.7 C) 98.3 F (36.8 C)  TempSrc: Oral Oral  Oral  SpO2: 98% 98% 96% 100%  Weight:      Height:          Data  Reviewed:  Basic Metabolic Panel: Recent Labs  Lab 01/22/24 1332 01/23/24 0555 01/24/24 0441  NA 140 140 138  K 3.8 3.9 4.0  CL 103 107 106  CO2 28 25 25   GLUCOSE 153* 125* 148*  BUN 13 13 17   CREATININE 0.86 1.03 0.99  CALCIUM 9.2 8.8* 8.7*    CBC: Recent Labs  Lab 01/22/24 1332 01/23/24 0555 01/24/24 0441  WBC 5.6 6.4 6.0  NEUTROABS 3.3  --   --   HGB 14.9 13.8 13.7  HCT 46.5 43.4 43.8  MCV 93.2 93.9 95.6  PLT 314 299 279    LFT Recent Labs  Lab 01/22/24 1332 01/24/24 0441  AST 17 14*  ALT 13 7  ALKPHOS 68 58  BILITOT 0.5 0.6  PROT 7.2 6.2*  ALBUMIN 3.5 3.0*     Antibiotics: Anti-infectives (From admission, onward)    Start     Dose/Rate Route Frequency Ordered Stop   01/22/24 2300   meropenem (MERREM) 1 g in sodium chloride 0.9 % 100 mL IVPB        1 g 200 mL/hr over 30 Minutes Intravenous Every 8 hours 01/22/24 1439 01/26/24 2359   01/22/24 1430  meropenem (MERREM) 1 g in sodium chloride 0.9 % 100 mL IVPB        1 g 200 mL/hr over 30 Minutes Intravenous  Once 01/22/24 1425 01/22/24 1552        DVT prophylaxis: Apixaban  Code Status: Full code  Family Communication: Discussed with patient's wife at bedside   CONSULTS    Subjective   Denies any complaints.   Objective    Physical Examination:  General-appears in no acute distress Heart-S1-S2, regular, no murmur auscultated Lungs-clear to auscultation bilaterally, no wheezing or crackles auscultated Abdomen-soft, nontender, no organomegaly Extremities-no edema in the lower extremities Neuro-alert, oriented x3, no focal deficit noted   Status is: Inpatient:      Pressure Injury 02/11/22 Buttocks Right;Left Stage 2 -  Partial thickness loss of dermis presenting as a shallow open injury with a red, pink wound bed without slough. top layer of skin toren (Active)  02/11/22 0745  Location: Buttocks  Location Orientation: Right;Left  Staging: Stage 2 -  Partial thickness loss of dermis presenting as a shallow open injury with a red, pink wound bed without slough.  Wound Description (Comments): top layer of skin toren  Present on Admission: Yes (according to spouse)        Meredeth Ide   [facility] If 7PM-7AM, please contact night-coverage at [email or web address], Office  [phone]   01/25/2024, 8:15 AM  LOS: 3 days

## 2024-01-25 NOTE — Progress Notes (Signed)
 Pharmacy Antibiotic Note  Robert [name] is a 78 y.o. male with hx CVA and chronic foley cath who was instructed to come to the ED on 01/22/24 for further workup and treatment of ESBL kleb pneumo from ucx on 01/18/24.  Pharmacy has been consulted to dose meropenem for UTI.   Today, 01/25/2024:  - scr 0.99, Crcl 80 ml/min - wbc wnl   Plan: - Continue meropenem 1gm q8h  ________________________________________  Temp (24hrs), Avg:98.3 F (36.8 C), Min:98.3 F (36.8 C), Max:98.3 F (36.8 C)  Recent Labs  Lab 01/22/24 1331 01/22/24 1332 01/23/24 0555 01/24/24 0441  WBC  --  5.6 6.4 6.0  CREATININE  --  0.86 1.03 0.99  LATICACIDVEN 1.8  --   --   --     Estimated Creatinine Clearance: 79.8 mL/min (by C-G formula based on SCr of 0.99 mg/dL).    Allergies  Allergen Reactions   Fluconazole Rash   Propofol Other (See Comments)    "hiccups for weeks"  Other reaction(s): Hiccups   Lisinopril Cough   Sulfamethoxazole-Trimethoprim Diarrhea   Fluoxetine Nausea Only and Other (See Comments)    Hallucinations, Feeling nervous, Nausea, Tachycardia     Adalberto Cole, PharmD, BCPS 01/25/2024 11:14 AM

## 2024-01-25 NOTE — TOC Initial Note (Signed)
 Transition of Care [facility]) - Initial/Assessment Note    Patient Details  Name: Davarious Tumbleson MRN: [id number] Date of Birth: [birth year]  [facility] CM/SW Contact:    Lanier Clam, RN Phone Number: 01/25/2024, 12:13 PM  Clinical Narrative:Spoke to spouse about d/c plan-d/c back home. Active w/[facility] rep Artavia HHRN/PT/OT.PTAR @ d/c-confirmed address. [facility] corodinator called left vm to inform of admission. Goes to [facility], PCP Verita Schneiders.                   Expected Discharge Plan: Home w Home Health Services Barriers to Discharge: Continued Medical Work up   Patient Goals and CMS Choice Patient states their goals for this hospitalization and ongoing recovery are:: home CMS Medicare.gov Compare Post Acute Care list provided to:: Patient Represenative (must comment) Choice offered to / list presented to : Spouse [place] ownership interest in [facility].provided to:: Spouse    Expected Discharge Plan and Services   Discharge Planning Services: CM Consult                                          Prior Living Arrangements/Services   Lives with:: Spouse              Current home services: DME, Home PT, Home RN, Home OT (rw,3n1;active w/[facility] HHRN/PT/OT)    Activities of Daily Living   ADL Screening (condition at time of admission) Independently performs ADLs?: No Does the patient have a NEW difficulty with bathing/dressing/toileting/self-feeding that is expected to last >3 days?: Yes (Initiates electronic notice to provider for possible OT consult) Does the patient have a NEW difficulty with getting in/out of bed, walking, or climbing stairs that is expected to last >3 days?: Yes (Initiates electronic notice to provider for possible PT consult) Does the patient have a NEW difficulty with communication that is expected to last >3 days?: No Is the patient deaf or have difficulty hearing?: Yes Does the patient  have difficulty seeing, even when wearing glasses/contacts?: Yes Does the patient have difficulty concentrating, remembering, or making decisions?: No  Permission Sought/Granted                  Emotional Assessment              Admission diagnosis:  Urinary tract infection due to ESBL Klebsiella [N39.0, B96.89] Patient Active Problem List   Diagnosis Date Noted   Urinary tract infection due to ESBL Klebsiella 01/22/2024   Diabetes mellitus type II, non insulin dependent (HCC) 02/10/2022   Embolic stroke (HCC) 05/15/2021   Spasticity as late effect of cerebrovascular accident (CVA) 05/15/2021   Asymptomatic carotid artery stenosis without infarction, left 05/12/2021   Carotid artery disease (HCC) 04/29/2021   Acute pancreatitis    Palliative care by specialist    Goals of care, counseling/discussion    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 01/07/2021   Gallstone pancreatitis 01/07/2021   Pressure injury of skin 01/05/2021   Sepsis secondary to UTI (HCC) 11/10/2020   AKI (acute kidney injury) (HCC) 11/10/2020   Hyperkalemia 11/10/2020   Parkinson's disease (HCC) 11/10/2020   Hyperglycemia 11/10/2020   Elevated LFTs 11/10/2020   Diarrhea    Other hydronephrosis    History of DVT    Urinary retention 08/14/2019   History of CVA with residual deficit    BPH/urethral stricture with  chronic Foley catheter    Orthostatic hypotension 08/13/2019   Hyperlipidemia    History of CVA with residual left hemiparesis 12/06/2017   Gait abnormality 11/22/2017   UTI (urinary tract infection) 04/22/2017   Acute lower UTI 04/22/2017   Tremor 04/22/2017   CAUTI? 09/29/2012   Nausea 09/29/2012   Fever 09/29/2012   Uncontrolled hypertension 09/29/2012   H/O: CVA (cerebrovascular accident) 09/29/2012   Hearing loss 09/29/2012   PCP:  Noel Gerold, NP Pharmacy:   [facility] [address] [place] - [address] [place]  [place] [address] Phone: [phone] Fax: [phone]  [facility] - [place], [place] - [address] [place] [address] Phone: [phone] Fax: [phone]  [id number] - [place], [place] - [address] AT [facility] [address] [place] [place] [address] Phone: [phone] Fax: [phone]     Social Drivers of Health (SDOH) Social History: SDOH Screenings   Food Insecurity: No Food Insecurity (01/22/2024)  Housing: Low Risk  (01/22/2024)  Transportation Needs: Unmet Transportation Needs (01/22/2024)  Utilities: Not At Risk (01/22/2024)  Depression (PHQ2-9): Low Risk  (01/22/2022)  Financial Resource Strain: Medium Risk (02/20/2020)  Social Connections: Socially Isolated (01/22/2024)  Tobacco Use: Medium Risk (01/22/2024)   SDOH Interventions:     Readmission Risk Interventions    02/11/2022   10:35 AM  Readmission Risk Prevention Plan  Transportation Screening Complete  PCP or Specialist Appt within 5-7 Days Complete  Home Care Screening Complete  Medication Review (RN CM) Complete

## 2024-01-26 DIAGNOSIS — N390 Urinary tract infection, site not specified: Secondary | ICD-10-CM | POA: Diagnosis not present

## 2024-01-26 DIAGNOSIS — B9689 Other specified bacterial agents as the cause of diseases classified elsewhere: Secondary | ICD-10-CM | POA: Diagnosis not present

## 2024-01-26 LAB — GLUCOSE, CAPILLARY
Glucose-Capillary: 124 mg/dL — ABNORMAL HIGH (ref 70–99)
Glucose-Capillary: 141 mg/dL — ABNORMAL HIGH (ref 70–99)
Glucose-Capillary: 131 mg/dL — ABNORMAL HIGH (ref 70–99)
Glucose-Capillary: 126 mg/dL — ABNORMAL HIGH (ref 70–99)

## 2024-01-26 NOTE — Progress Notes (Signed)
 PROGRESS NOTE  Robert [name]  QQV:[id number] DOB: 07-14-1946 DOA: 01/22/2024 PCP: Noel Gerold, NP   Brief Narrative:  Patient is a 78 y.o. male with medical history significant for CVA, BPH, hypertension, hyperlipidemia, DVT status post IVC, urinary retention with chronic Foley admitted to the hospital with Klebsiella ESBL UTI was brought to the emergency department with complaint of low-grade fever, loose stools.  History of Foley catheter exchange at home on 3/24.  At that time, urine sample was collected and was sent to outpatient via office.  They called saying that the urine culture was positive for infection and was recommended to go to the emergency department.  As per culture report, it is ESBL Klebsiella.  Currently on meropenem.  Plan to continue 5 days of IV meropenem before discharge tomorrow  Assessment & Plan:  Principal Problem:   Urinary tract infection due to ESBL Klebsiella  Klebsiella ESBL catheter associated UTI -Presented with some low-grade fevers, intermittent altered mental status, mild diarrhea.  - Wife also mentioned intermittent dysuria.  Not septic, hemodynamically stable. -Per urine culture report from [place] office, patient had ESBL Klebsiella UTI -Started on V meropenem -Records from [place] shows that he does have Klebsiella ESBL UTI -Plan to treat for 5 days with IV meropenem. -Can be discharged home after completing 5 days of treatment.  Likely on Thursday or early Friday.   GERD-continue pantoprazole   History of CVA-continue aspirin, Eliquis, statin   Dementia-On Namenda   Hyperlipidemia-On Zocor   Parkinson's-continue Sinemet, pimavanserin, rasagiline   Type 2 diabetes-historically well-controlled, carb modified diet, moderate dose sliding scale. 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 top layer of skin toren (Active)  02/11/22 0745  Location: Buttocks  Location Orientation: Right;Left  Staging: Stage 2 -  Partial thickness loss of dermis presenting as a shallow open injury with a red, pink wound bed without slough.  Wound Description (Comments): top layer of skin toren  Present on Admission: Yes  Dressing Type Foam - Lift dressing to assess site every shift 01/26/24 0900    DVT prophylaxis: apixaban (ELIQUIS) tablet 5 mg     Code Status: Full Code  Family Communication: Care taker at bedside,wife on phone  Patient status:Inpatient  Patient is from :home  Anticipated discharge PP:IRJJ  Estimated DC date: After for 5 days course of IV meropenem   Consultants: None  Procedures:None  Antimicrobials:  Anti-infectives (From admission, onward)    Start     Dose/Rate Route Frequency Ordered Stop   01/22/24 2300  meropenem (MERREM) 1 g in sodium chloride 0.9 % 100 mL IVPB        1 g 200 mL/hr over 30 Minutes Intravenous Every 8 hours 01/22/24 1439 01/26/24 2359   01/22/24 1430  meropenem (MERREM) 1 g in sodium chloride 0.9 % 100 mL IVPB        1 g 200 mL/hr over 30 Minutes Intravenous  Once 01/22/24 1425 01/22/24 1552       Subjective: Patient seen and examined at the bedside today.  Hemodynamically stable.  Comfortably sitting in the chair.  Caregiver at bedside.  Patient is alert and awake but not oriented.  Not in any kind of distress  Objective: Vitals:   01/24/24 2019 01/25/24 1149 01/25/24 2025 01/26/24 8841  BP: (!) 147/83 134/72 135/72 (!) 155/75  Pulse: 79 72 69 79  Resp: 15 16  19   Temp:  98.5 F (36.9 C) 98.2 F (36.8 C) 98.2 F (36.8 C)  TempSrc:  Oral  Oral  SpO2: 98% 99% 97% 97%  Weight:      Height:        Intake/Output Summary (Last 24 hours) at 01/26/2024 1120 Last data filed at 01/26/2024 0911 Gross per 24 hour  Intake 1483.33 ml  Output 1650 ml  Net -166.67 ml    Filed Weights   01/22/24 1524 01/23/24 0144  Weight: 106.6 kg 99 kg    Examination:  General exam: Overall comfortable, not in distress HEENT: PERRL Respiratory system:  no wheezes or crackles  Cardiovascular system: S1 & S2 heard, RRR.  Gastrointestinal system: Abdomen is nondistended, soft and nontender. Central nervous system: Alert and awake,but not oriented Extremities: No edema, no clubbing ,no cyanosis Skin: No rashes, no ulcers,no icterus     Data Reviewed: I have personally reviewed following labs and imaging studies  CBC: Recent Labs  Lab 01/22/24 1332 01/23/24 0555 01/24/24 0441  WBC 5.6 6.4 6.0  NEUTROABS 3.3  --   --   HGB 14.9 13.8 13.7  HCT 46.5 43.4 43.8  MCV 93.2 93.9 95.6  PLT 314 299 279   Basic Metabolic Panel: Recent Labs  Lab 01/22/24 1332 01/23/24 0555 01/24/24 0441  NA 140 140 138  K 3.8 3.9 4.0  CL 103 107 106  CO2 28 25 25   GLUCOSE 153* 125* 148*  BUN 13 13 17   CREATININE 0.86 1.03 0.99  CALCIUM 9.2 8.8* 8.7*     Recent Results (from the past 240 hours)  Urine Culture     Status: Abnormal   Collection Time: 01/22/24  4:16 PM   Specimen: Urine, Random  Result Value Ref Range Status   Specimen Description   Final    URINE, RANDOM Performed at [facility], [address], [place], [place] [address]    Special Requests   Final    NONE Reflexed from [id number] Performed at [facility], [address] [place], [place] [address]    Culture (A)  Final    >=100,000 COLONIES/mL KLEBSIELLA PNEUMONIAE Confirmed Extended Spectrum Beta-Lactamase Producer (ESBL).  In bloodstream infections from ESBL organisms, carbapenems are preferred over piperacillin/tazobactam. They are shown to have a lower risk of mortality.    Report Status 01/24/2024 FINAL  Final   Organism ID, Bacteria KLEBSIELLA PNEUMONIAE (A)  Final      Susceptibility   Klebsiella pneumoniae - MIC*    AMPICILLIN >=32 RESISTANT  Resistant     CEFAZOLIN >=64 RESISTANT Resistant     CEFEPIME 0.25 SENSITIVE Sensitive     CEFTRIAXONE >=64 RESISTANT Resistant     CIPROFLOXACIN 2 RESISTANT Resistant     GENTAMICIN <=1 SENSITIVE Sensitive     IMIPENEM <=0.25 SENSITIVE Sensitive     NITROFURANTOIN 128 RESISTANT Resistant     TRIMETH/SULFA >=320 RESISTANT Resistant     AMPICILLIN/SULBACTAM >=32 RESISTANT Resistant     PIP/TAZO >=128 RESISTANT Resistant ug/mL    * >=100,000 COLONIES/mL KLEBSIELLA PNEUMONIAE  Culture, blood (routine x 2)     Status: None (Preliminary result)   Collection Time: 01/22/24  5:04 PM   Specimen: BLOOD RIGHT ARM  Result Value Ref Range Status   Specimen Description   Final    BLOOD RIGHT ARM Performed at [facility], [address], [place], [place] [address]    Special Requests   Final    BOTTLES DRAWN AEROBIC AND ANAEROBIC Blood Culture results may not be optimal due to an inadequate volume of blood received in culture bottles Performed at [facility], [address] [place], [place] [address]    Culture   Final    NO GROWTH 4 DAYS Performed at [facility], [address] [place], [place] [address]    Report Status PENDING  Incomplete  Culture, blood (routine x 2)     Status: None (Preliminary result)   Collection Time: 01/22/24  5:11 PM   Specimen: BLOOD RIGHT HAND  Result Value Ref Range Status   Specimen Description   Final    BLOOD RIGHT HAND Performed at [facility], [address] [place], [place] [address]    Special Requests   Final    BOTTLES DRAWN AEROBIC ONLY Blood Culture results may not be optimal due to an inadequate volume of blood received in culture bottles Performed at [facility], [address] [place], [place] [address]    Culture   Final    NO GROWTH 4 DAYS Performed at [facility], [address], [place], [place] [address]    Report Status PENDING  Incomplete  Resp panel by RT-PCR  (RSV, Flu A&B, Covid) Anterior Nasal Swab     Status: None   Collection Time: 01/23/24  1:52 AM   Specimen: Anterior Nasal Swab  Result Value Ref Range Status   SARS Coronavirus 2 by RT PCR NEGATIVE NEGATIVE Final    Comment: (NOTE) SARS-CoV-2 target nucleic acids are NOT DETECTED.  The SARS-CoV-2 RNA is generally detectable in upper respiratory specimens during the acute phase of infection. The lowest concentration of SARS-CoV-2 viral copies this assay can detect is 138 copies/mL. A negative result does not preclude SARS-Cov-2 infection and should not be used as the sole basis for treatment or other patient management decisions. A negative result may occur with  improper specimen collection/handling, submission of specimen other than nasopharyngeal swab, presence of viral mutation(s) within the areas targeted by this assay, and inadequate number of viral copies(<138 copies/mL). A negative result must be combined with clinical observations, patient history, and epidemiological information. The expected result is Negative.  Fact Sheet for Patients:  BloggerCourse.com  Fact Sheet for Healthcare Providers:  SeriousBroker.it  This test is no t yet approved or cleared by the [place] FDA and  has been authorized for detection and/or diagnosis of SARS-CoV-2 by FDA under an Emergency Use Authorization (EUA). This EUA will remain  in effect (meaning this test can be used) for the duration of the COVID-19 declaration under Section 564(b)(1) of the Act, 21 U.S.C.section 360bbb-3(b)(1), unless the authorization is terminated  or revoked sooner.       Influenza A by PCR NEGATIVE NEGATIVE Final   Influenza B by PCR NEGATIVE NEGATIVE Final    Comment: (NOTE) The Xpert Xpress SARS-CoV-2/FLU/RSV plus assay is intended as an aid in the diagnosis of influenza from Nasopharyngeal swab specimens and should not be used as a sole basis for  treatment. Nasal washings and aspirates are unacceptable for Xpert Xpress SARS-CoV-2/FLU/RSV testing.  Fact Sheet for Patients: BloggerCourse.com  Fact Sheet for Healthcare Providers: SeriousBroker.it  This test is not yet approved or cleared by the [place] FDA and has been authorized for detection and/or diagnosis of SARS-CoV-2 by FDA under an Emergency Use Authorization (  EUA). This EUA will remain in effect (meaning this test can be used) for the duration of the COVID-19 declaration under Section 564(b)(1) of the Act, 21 U.S.C. section 360bbb-3(b)(1), unless the authorization is terminated or revoked.     Resp Syncytial Virus by PCR NEGATIVE NEGATIVE Final    Comment: (NOTE) Fact Sheet for Patients: BloggerCourse.com  Fact Sheet for Healthcare Providers: SeriousBroker.it  This test is not yet approved or cleared by the [place] FDA and has been authorized for detection and/or diagnosis of SARS-CoV-2 by FDA under an Emergency Use Authorization (EUA). This EUA will remain in effect (meaning this test can be used) for the duration of the COVID-19 declaration under Section 564(b)(1) of the Act, 21 U.S.C. section 360bbb-3(b)(1), unless the authorization is terminated or revoked.  Performed at [facility], [address] [place], [place] [address]      Radiology Studies: No results found.  Scheduled Meds:  apixaban  5 mg Oral BID   aspirin EC  81 mg Oral Daily   busPIRone  10 mg Oral q AM   And   busPIRone  5 mg Oral BID   Carbidopa-Levodopa ER  3 tablet Oral BID   And   Carbidopa-Levodopa ER  2 tablet Oral QHS   Chlorhexidine Gluconate Cloth  6 each Topical Daily   Gerhardt's butt cream   Topical BID   insulin aspart  0-15 Units Subcutaneous TID WC   insulin aspart  0-5 Units Subcutaneous QHS   memantine  20 mg Oral QHS   oxybutynin  5  mg Oral BID   pantoprazole  40 mg Oral Daily   Pimavanserin Tartrate  34 mg Oral Daily   rasagiline  1 mg Oral Daily   simvastatin  20 mg Oral q1800   Continuous Infusions:  meropenem (MERREM) IV Stopped (01/26/24 6045)     LOS: 4 days   Burnadette Pop, MD [facility] P4/11/2023, 11:20 AM

## 2024-01-26 NOTE — TOC Progression Note (Signed)
 Transition of Care [facility]) - Progression Note    Patient Details  Name: Robert [name] MRN: [id number] Date of Birth: Jul 24, 1946  Transition of Care [facility]) CM/SW Contact  Jordan Caraveo, Olegario Messier, RN Phone Number: 01/26/2024, 3:07 PM  Clinical Narrative:d/c plan home w/HHC;PTAR @ d/c.Spouse [name] aware-she will be at home for d/c.      Expected Discharge Plan: Home w Home Health Services Barriers to Discharge: Continued Medical Work up  Expected Discharge Plan and Services   Discharge Planning Services: CM Consult Post Acute Care Choice: Resumption of Svcs/PTA Provider Living arrangements for the past 2 months: Single Family Home                           HH Arranged: RN, PT, OT [facility] Agency: [facility] Date HH Agency Contacted: 01/26/24 Time HH Agency Contacted: 1507 Representative spoke with at [facility] Agency: Adele Dan   Social Determinants of Health (SDOH) Interventions SDOH Screenings   Food Insecurity: No Food Insecurity (01/22/2024)  Housing: Low Risk  (01/22/2024)  Transportation Needs: Unmet Transportation Needs (01/22/2024)  Utilities: Not At Risk (01/22/2024)  Depression (PHQ2-9): Low Risk  (01/22/2022)  Financial Resource Strain: Medium Risk (02/20/2020)  Social Connections: Socially Isolated (01/22/2024)  Tobacco Use: Medium Risk (01/22/2024)    Readmission Risk Interventions    02/11/2022   10:35 AM  Readmission Risk Prevention Plan  Transportation Screening Complete  PCP or Specialist Appt within 5-7 Days Complete  Home Care Screening Complete  Medication Review (RN CM) Complete

## 2024-01-26 NOTE — Progress Notes (Signed)
 Occupational Therapy Treatment Patient Details Name: Robert [name] MRN: [id number] DOB: 1946/08/08 Today's Date: 01/26/2024   History of present illness Pt is a 78 yr old male admitted with dx of UTI. PMH: CVA with L side deficits, dementia,Parkinson's, DM, depression, sacral wound, DVT, chronic foley, IVC filter   OT comments  The pt's spouse and 6 day a week caregiver were present during the session. Pt's caregiver was observed performing toileting tasks with the pt at bathroom level, using a Corene Cornea to transfer pt off the toilet & to the bed. Pt's spouse reported the pt was currently fatigued & requested for OT to work with the pt at bed level. OT subsequently instructed the pt on BUE & BLE ROM, stretches and exercises, for improved joint flexibility and muscle strengthening needed to facilitate improved. The pt required active assist to intermittent PROM for his LUE, given history of L hemiparesis. Max assist was further needed to reposition and scoot the pt in be. Lastly, the pt's spouse reported occasionally assisting the pt with feeding at his baseline, given his difficulty maintaining grasp of standard utensils. OT subsequently educated them the use of built-up utensils to facilitate improved ease with feeding task. Foam pieces were provided. Continue OT plan of care. Pt's spouse requested pt to return home with home health therapy upon discharge.       If plan is discharge home, recommend the following:  Two people to help with walking and/or transfers;A lot of help with bathing/dressing/bathroom;Assistance with cooking/housework;Assistance with feeding;Direct supervision/assist for medications management;Direct supervision/assist for financial management;Assist for transportation;Help with stairs or ramp for entrance;Supervision due to cognitive status   Equipment Recommendations  Other (comment) (Built-up utensils for feeding)    Recommendations for Other Services      Precautions /  Restrictions Precautions Precautions: Fall Precaution/Restrictions Comments: L hemiplegia-chronic Restrictions Weight Bearing Restrictions Per Provider Order: No       Mobility Bed Mobility Overal bed mobility: Needs Assistance Bed Mobility: Sit to Supine Rolling: Max assist         General bed mobility comments: The pt also required max assist to perform scooting to the head of the bed          ADL either performed or assessed with clinical judgement   ADL Overall ADL's : Needs assistance/impaired Eating/Feeding: With adaptive utensils Eating/Feeding Details (indicate cue type and reason): The pt's spouse reported occasionally assisting the pt with feeding at his baseline, given his difficulty maintaining grasp of standard utensils. OT subsequently educated the pt on use of built-up utensils to facilitate improved ease with task. Foam pieces were provided.                 Communication Communication Communication: Impaired Factors Affecting Communication: Hearing impaired   Cognition Arousal: Alert Behavior During Therapy: WFL for tasks assessed/performed          Following commands impaired: Follows one step commands with increased time                    Pertinent Vitals/ Pain       Pain Assessment Pain Assessment: No/denies pain         Frequency  Min 2X/week        Progress Toward Goals  OT Goals(current goals can now be found in the care plan section)  Progress towards OT goals: Progressing toward goals  Acute Rehab OT Goals Patient Stated Goal: to maintain functional activity and to not have functional regression OT  Goal Formulation: With patient/family Time For Goal Achievement: 02/07/24 Potential to Achieve Goals: Good  Plan         AM-PAC OT "6 Clicks" Daily Activity     Outcome Measure   Help from another person eating meals?: A Little Help from another person taking care of personal grooming?: A Little Help from another  person toileting, which includes using toliet, bedpan, or urinal?: A Lot Help from another person bathing (including washing, rinsing, drying)?: A Lot Help from another person to put on and taking off regular upper body clothing?: A Lot Help from another person to put on and taking off regular lower body clothing?: Total 6 Click Score: 13    End of Session Equipment Utilized During Treatment: Gait belt  OT Visit Diagnosis: Unsteadiness on feet (R26.81);Other abnormalities of gait and mobility (R26.89);Muscle weakness (generalized) (M62.81);Hemiplegia and hemiparesis   Activity Tolerance Patient tolerated treatment well   Patient Left in bed;with call bell/phone within reach;with family/visitor present;with bed alarm set   Nurse Communication Mobility status        Time: 1340-1411 OT Time Calculation (min): 31 min  Charges: OT Treatments $Therapeutic Activity: 8-22 mins $Therapeutic Exercise: 8-22 mins     Reuben Likes, OTR/L 01/26/2024, 3:36 PM

## 2024-01-26 NOTE — Progress Notes (Signed)
 Physical Therapy Treatment Patient Details Name: Robert [name] MRN: [id number] DOB: 22-Nov-1945 Today's Date: 01/26/2024   History of Present Illness Pt is a 78 y.o. male admitted with dx of UTI. PMH: CVA with L side deficits, dementia,Parkinson's, DM, depression, sacral wound, DVT, chronic foley, IVC filter    PT Comments  Pt agreeable to working with therapy. Family (at start of session) and caregiver agreeable as well. Pt continues to require +2 assist for mobility. Will continue to follow pt during hospital stay. Plan is for return home with HHPT f/u.     If plan is discharge home, recommend the following: Two people to help with walking and/or transfers;A lot of help with bathing/dressing/bathroom   Can travel by private vehicle        Equipment Recommendations  None recommended by PT    Recommendations for Other Services       Precautions / Restrictions Precautions Precautions: Fall Precaution/Restrictions Comments: L hemiplegia-chronic Required Braces or Orthoses:  (L AFO, L palm protector, L soft wrist brace) Restrictions Weight Bearing Restrictions Per Provider Order: No     Mobility  Bed Mobility Overal bed mobility: Needs Assistance Bed Mobility: Supine to Sit     Supine to sit: Max assist, +2 for physical assistance, +2 for safety/equipment, HOB elevated, Used rails     General bed mobility comments: Increased time. Pt able to use rail and move bil LEs a bit, R more than L. Utilized bedpad for positioning, scooting to EOB.    Transfers Overall transfer level: Needs assistance   Transfers: Sit to/from Stand Sit to Stand: Max assist, +2 physical assistance, +2 safety/equipment           General transfer comment: Assist to shift weight anteriorly and maintain, power up, facilitaiton of hip extension at glutes. Pt tends to maintain flexed trunk and knee posture. Uses R UE-no functional use of L UE. Increased time required. Transfer via Lift Equipment:  Stedy  Ambulation/Gait               General Gait Details: nonambulatory at baseline   Stairs             Wheelchair Mobility     Tilt Bed    Modified Rankin (Stroke Patients Only)       Balance Overall balance assessment: Needs assistance   Sitting balance-Leahy Scale: Poor   Postural control: Posterior lean Standing balance support: Reliant on assistive device for balance Standing balance-Leahy Scale: Poor                              Communication Communication Communication: Impaired Factors Affecting Communication: Hearing impaired  Cognition Arousal: Alert Behavior During Therapy: WFL for tasks assessed/performed   PT - Cognitive impairments: History of cognitive impairments                         Following commands: Impaired Following commands impaired: Follows one step commands with increased time    Cueing Cueing Techniques: Verbal cues, Tactile cues  Exercises      General Comments        Pertinent Vitals/Pain Pain Assessment Pain Assessment: No/denies pain    Home Living                          Prior Function            PT Goals (current  goals can now be found in the care plan section) Progress towards PT goals: Progressing toward goals    Frequency    Min 2X/week      PT Plan      Co-evaluation              AM-PAC PT "6 Clicks" Mobility   Outcome Measure  Help needed turning from your back to your side while in a flat bed without using bedrails?: Total Help needed moving from lying on your back to sitting on the side of a flat bed without using bedrails?: Total Help needed moving to and from a bed to a chair (including a wheelchair)?: Total Help needed standing up from a chair using your arms (e.g., wheelchair or bedside chair)?: Total Help needed to walk in hospital room?: Total Help needed climbing 3-5 steps with a railing? : Total 6 Click Score: 6    End of  Session Equipment Utilized During Treatment: Gait belt Activity Tolerance: Patient tolerated treatment well Patient left: in chair;with call bell/phone within reach;with family/visitor present Nurse Communication: Need for lift equipment PT Visit Diagnosis: Muscle weakness (generalized) (M62.81);Hemiplegia and hemiparesis;Other symptoms and signs involving the nervous system (R29.898) Hemiplegia - Right/Left: Left     Time: 1884-1660 PT Time Calculation (min) (ACUTE ONLY): 18 min  Charges:    $Therapeutic Activity: 8-22 mins PT General Charges $$ ACUTE PT VISIT: 1 Visit                        Faye Ramsay, PT Acute Rehabilitation  Office: [phone]

## 2024-01-27 DIAGNOSIS — N390 Urinary tract infection, site not specified: Secondary | ICD-10-CM | POA: Diagnosis not present

## 2024-01-27 DIAGNOSIS — B9689 Other specified bacterial agents as the cause of diseases classified elsewhere: Secondary | ICD-10-CM | POA: Diagnosis not present

## 2024-01-27 LAB — CULTURE, BLOOD (ROUTINE X 2)

## 2024-01-27 LAB — GLUCOSE, CAPILLARY
Glucose-Capillary: 134 mg/dL — ABNORMAL HIGH (ref 70–99)
Glucose-Capillary: 133 mg/dL — ABNORMAL HIGH (ref 70–99)

## 2024-01-27 MED ORDER — ZINC OXIDE 20 % EX OINT: 1.0000 | TOPICAL_OINTMENT | CUTANEOUS | 0 refills | Status: AC

## 2024-01-27 NOTE — TOC Transition Note (Signed)
 Transition of Care [facility]) - Discharge Note   Patient Details  Name: Robert [name] MRN: [id number] Date of Birth: 20-Feb-1946  Transition of Care [facility]) CM/SW Contact:  Lanier Clam, RN Phone Number: 01/27/2024, 10:50 AM   Clinical Narrative:  d/c home w/[facility] Adele Dan. [name] (spouse) aware & agree w/ d/c plan. PTAR called. No further CM needs.     Final next level of care: Home w Home Health Services Barriers to Discharge: No Barriers Identified   Patient Goals and CMS Choice Patient states their goals for this hospitalization and ongoing recovery are:: Home CMS Medicare.gov Compare Post Acute Care list provided to:: Patient Represenative (must comment) ([name](spouse)) Choice offered to / list presented to : Spouse Halchita ownership interest in [facility].provided to:: Spouse    Discharge Placement                Patient to be transferred to facility by: PTAR Name of family member notified: [name](spouse) Patient and family notified of of transfer: 01/27/24  Discharge Plan and Services Additional resources added to the After Visit Summary for     Discharge Planning Services: CM Consult Post Acute Care Choice: Resumption of Svcs/PTA Provider                    HH Arranged: RN, PT, OT [facility] Agency: [facility] Date HH Agency Contacted: 01/27/24 Time HH Agency Contacted: [phone] Representative spoke with at [facility] Agency: Adele Dan  Social Drivers of Health (SDOH) Interventions SDOH Screenings   Food Insecurity: No Food Insecurity (01/22/2024)  Housing: Low Risk  (01/22/2024)  Transportation Needs: No Transportation Needs (01/26/2024)  Recent Concern: Transportation Needs - Unmet Transportation Needs (01/22/2024)  Utilities: Not At Risk (01/22/2024)  Depression (PHQ2-9): Low Risk  (01/22/2022)  Financial Resource Strain: Medium Risk (02/20/2020)  Social Connections: Socially Isolated (01/22/2024)  Tobacco Use: Medium Risk (01/22/2024)      Readmission Risk Interventions    02/11/2022   10:35 AM  Readmission Risk Prevention Plan  Transportation Screening Complete  PCP or Specialist Appt within 5-7 Days Complete  Home Care Screening Complete  Medication Review (RN CM) Complete

## 2024-01-27 NOTE — Plan of Care (Signed)
  Problem: Metabolic: Goal: Ability to maintain appropriate glucose levels will improve Outcome: Progressing   Problem: Skin Integrity: Goal: Risk for impaired skin integrity will decrease Outcome: Progressing   Problem: Clinical Measurements: Goal: Diagnostic test results will improve Outcome: Progressing Goal: Respiratory complications will improve Outcome: Progressing Goal: Cardiovascular complication will be avoided Outcome: Progressing

## 2024-01-27 NOTE — Discharge Summary (Signed)
 Physician Discharge Summary  Robert [name] WUJ:[id number] DOB: August 30, 1946 DOA: 01/22/2024  PCP: Noel Gerold, NP  Admit date: 01/22/2024 Discharge date: 01/27/2024  Admitted From: Home Disposition:  Home  Discharge Condition:Stable CODE STATUS:FULL Diet recommendation: Diabetic  Brief/Interim Summary: Patient is a 78 y.o. male with medical history significant for CVA, BPH, hypertension, hyperlipidemia, DVT status post IVC, urinary retention with chronic Foley admitted to the hospital with Klebsiella ESBL UTI was brought to the emergency department with complaint of low-grade fever, loose stools.  History of Foley catheter exchange at home on 3/24.  At that time, urine sample was collected and was sent to outpatient via office.  They called saying that the urine culture was positive for infection and was recommended to go to the emergency department.  As per culture report, it is ESBL Klebsiella.  He completed 5 days course of meropenem.  Medically stable for discharge  Following problems were addressed during the hospitalization:  Klebsiella ESBL catheter associated UTI -Presented with some low-grade fevers, intermittent altered mental status, mild diarrhea.  - Wife also mentioned intermittent dysuria.  Not septic, hemodynamically stable. -Per urine culture report from [place] office, patient had ESBL Klebsiella UTI -Started on IV meropenem -Records from [place] shows that he does have Klebsiella ESBL UTI -S/P  5 days course of IV meropenem.   GERD-continue PPI   History of CVA-continue aspirin, Eliquis, statin   Dementia-On Namenda   Hyperlipidemia-On Zocor   Parkinson's-continue Sinemet, pimavanserin, rasagiline   Type 2 diabetes-historically well-controlled, On metformin  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  Discharge Diagnoses:  Principal Problem:   Urinary tract infection due to ESBL Klebsiella    Discharge Instructions  Discharge  Instructions     Diet Carb Modified   Complete by: As directed    Discharge instructions   Complete by: As directed    1)Please take your medications as instructed 2)Follow up with your PCP in a week   Discharge wound care:   Complete by: As directed    As per wound care nurse   Increase activity slowly   Complete by: As directed       Allergies as of 01/27/2024       Reactions   Fluconazole Rash   Propofol Other (See Comments)   "hiccups for weeks" Other reaction(s): Hiccups   Lisinopril Cough   Sulfamethoxazole-trimethoprim Diarrhea   Fluoxetine Nausea Only, Other (See Comments)   Hallucinations, Feeling nervous, Nausea, Tachycardia        Medication List     STOP taking these medications    cetirizine 10 MG tablet Commonly known as: ZYRTEC       TAKE these medications    acetaminophen 500 MG tablet Commonly known as: TYLENOL Take 1,000 mg by mouth every 6 (six) hours as needed for mild pain (pain score 1-3), moderate pain (pain score 4-6) or fever.   apixaban 5 MG Tabs tablet Commonly known as: ELIQUIS Take 1 tablet (5 mg total) by mouth 2 (two) times daily.   ascorbic acid 500 MG tablet Commonly known as: VITAMIN C TAKE ONE TABLET BY MOUTH DAILY FOR PREVENTION OF UTI   aspirin EC 81 MG tablet Take 81 mg by mouth daily. Swallow whole.   bacitracin 500 UNIT/GM ointment Apply 1 Application topically daily as needed for wound care.   busPIRone 10 MG tablet Commonly known as: BUSPAR Take 5-10 mg by mouth See admin instructions. Take 10 mg by mouth in the morning. Take 5 mg  by mouth in the afternoon and at bedtime.   Carbidopa-Levodopa ER 25-100 MG tablet controlled release Commonly known as: SINEMET CR 2-3 tablets See admin instructions. Take 3 tablets by mouth in the morning and afternoon. Take 2 tablets in the evening.   cephALEXin 250 MG capsule Commonly known as: KEFLEX Take 250 mg by mouth at bedtime.   Cholecalciferol 50 MCG (2000 UT)  Tabs Take 2,000 Units by mouth daily.   Dupilumab 300 MG/2ML Soaj Inject 600 mg into the skin.   HYDROCORTISONE ACE (RECTAL) 30 MG Supp Place 30 mg rectally at bedtime.   melatonin 3 MG Tabs tablet Take 6 mg by mouth at bedtime.   memantine 10 MG tablet Commonly known as: NAMENDA Take 20 mg by mouth at bedtime.   metFORMIN 500 MG tablet Commonly known as: GLUCOPHAGE Take 500 mg by mouth at bedtime.   miconazole 2 % powder Commonly known as: MICOTIN Apply 1 Application topically daily.   Nuplazid 34 MG Caps Generic drug: Pimavanserin Tartrate Take 34 mg by mouth daily.   nystatin-triamcinolone ointment Commonly known as: MYCOLOG Apply 1 application  topically as needed (rash/yeast).   omeprazole 20 MG capsule Commonly known as: PRILOSEC Take 20 mg by mouth daily.   oxybutynin 5 MG tablet Commonly known as: DITROPAN Take 5 mg by mouth in the morning and at bedtime.   polyethylene glycol 17 g packet Commonly known as: MIRALAX / GLYCOLAX Take 17 g by mouth daily.   rasagiline 1 MG Tabs tablet Commonly known as: AZILECT Take 1 mg by mouth daily.   senna-docusate 8.6-50 MG tablet Commonly known as: Senokot-S Take 2 tablets by mouth daily as needed for mild constipation or moderate constipation.   simvastatin 40 MG tablet Commonly known as: ZOCOR Take 20 mg by mouth at bedtime.   tacrolimus 0.1 % ointment Commonly known as: PROTOPIC Apply 1 application. topically See admin instructions. Apply to groin, armpit, and bottom when skin is broken out twice a day as needed   triamcinolone cream 0.1 % Commonly known as: KENALOG Apply 1 Application topically daily as needed (rash).   zinc oxide 20 % ointment Apply 1 Application topically See admin instructions. Every time there is a diaper change               Discharge Care Instructions  (From admission, onward)           Start     Ordered   01/27/24 0000  Discharge wound care:       Comments: As  per wound care nurse   01/27/24 1025            Follow-up Information     Noel Gerold, NP. Schedule an appointment as soon as possible for a visit in 1 week(s).   Contact information: [address] [place] [address] [phone]                Allergies  Allergen Reactions   Fluconazole Rash   Propofol Other (See Comments)    "hiccups for weeks"  Other reaction(s): Hiccups   Lisinopril Cough   Sulfamethoxazole-Trimethoprim Diarrhea   Fluoxetine Nausea Only and Other (See Comments)    Hallucinations, Feeling nervous, Nausea, Tachycardia    Consultations: None   Procedures/Studies: DG Chest Port 1 View Result Date: 01/22/2024 CLINICAL DATA:  Fever EXAM: PORTABLE CHEST 1 VIEW COMPARISON:  09/15/2023 FINDINGS: The heart size and mediastinal contours are within normal limits. No focal airspace consolidation, pleural effusion, or pneumothorax. Advanced  degenerative changes of both shoulders. IMPRESSION: No active disease. Electronically Signed   By: Duanne Guess D.O.   On: 01/22/2024 13:47      Subjective: Patient seen and examined at bedside today.  Hemodynamically stable.  Comfortable.  Lying in bed.  Wife at bedside.  No acute issues.  Medically stable for discharge home today.  Completed antibiotics course  Discharge Exam: Vitals:   01/26/24 2051 01/27/24 0618  BP: 135/74 (!) 131/93  Pulse: 75 82  Resp: 20 18  Temp: 98.2 F (36.8 C) 98 F (36.7 C)  SpO2: 97% 97%   Vitals:   01/26/24 0617 01/26/24 1431 01/26/24 2051 01/27/24 0618  BP: (!) 155/75 131/84 135/74 (!) 131/93  Pulse: 79 76 75 82  Resp: 19 19 20 18   Temp: 98.2 F (36.8 C) 98.2 F (36.8 C) 98.2 F (36.8 C) 98 F (36.7 C)  TempSrc: Oral     SpO2: 97% 94% 97% 97%  Weight:      Height:        General: Pt is alert, awake, not in acute distress Cardiovascular: RRR, S1/S2 +, no rubs, no gallops Respiratory: CTA bilaterally, no wheezing, no rhonchi Abdominal: Soft, NT,  ND, bowel sounds + Extremities: no edema, no cyanosis    The results of significant diagnostics from this hospitalization (including imaging, microbiology, ancillary and laboratory) are listed below for reference.     Microbiology: Recent Results (from the past 240 hours)  Urine Culture     Status: Abnormal   Collection Time: 01/22/24  4:16 PM   Specimen: Urine, Random  Result Value Ref Range Status   Specimen Description   Final    URINE, RANDOM Performed at [facility], [address], [place], [place] [address]    Special Requests   Final    NONE Reflexed from [id number] Performed at [facility], [address], [place], [place] [address]    Culture (A)  Final    >=100,000 COLONIES/mL KLEBSIELLA PNEUMONIAE Confirmed Extended Spectrum Beta-Lactamase Producer (ESBL).  In bloodstream infections from ESBL organisms, carbapenems are preferred over piperacillin/tazobactam. They are shown to have a lower risk of mortality.    Report Status 01/24/2024 FINAL  Final   Organism ID, Bacteria KLEBSIELLA PNEUMONIAE (A)  Final      Susceptibility   Klebsiella pneumoniae - MIC*    AMPICILLIN >=32 RESISTANT Resistant     CEFAZOLIN >=64 RESISTANT Resistant     CEFEPIME 0.25 SENSITIVE Sensitive     CEFTRIAXONE >=64 RESISTANT Resistant     CIPROFLOXACIN 2 RESISTANT Resistant     GENTAMICIN <=1 SENSITIVE Sensitive     IMIPENEM <=0.25 SENSITIVE Sensitive     NITROFURANTOIN 128 RESISTANT Resistant     TRIMETH/SULFA >=320 RESISTANT Resistant     AMPICILLIN/SULBACTAM >=32 RESISTANT Resistant     PIP/TAZO >=128 RESISTANT Resistant ug/mL    * >=100,000 COLONIES/mL KLEBSIELLA PNEUMONIAE  Culture, blood (routine x 2)     Status: None   Collection Time: 01/22/24  5:04 PM   Specimen: BLOOD RIGHT ARM  Result Value Ref Range Status   Specimen Description   Final    BLOOD RIGHT ARM Performed at [facility], [address] [place], [place] [address]     Special Requests   Final    BOTTLES DRAWN AEROBIC AND ANAEROBIC Blood Culture results may not be optimal due to an inadequate volume of blood received in culture bottles Performed at [facility], [address] [place], [place] [address]    Culture   Final    NO GROWTH 5 DAYS Performed at [facility], [address], [place], [place] [address]    Report Status 01/27/2024 FINAL  Final  Culture, blood (routine x 2)     Status: None   Collection Time: 01/22/24  5:11 PM   Specimen: BLOOD RIGHT HAND  Result Value Ref Range Status   Specimen Description   Final    BLOOD RIGHT HAND Performed at [facility], [address] [place], [place] [address]    Special Requests   Final    BOTTLES DRAWN AEROBIC ONLY Blood Culture results may not be optimal due to an inadequate volume of blood received in culture bottles Performed at [facility], [address], [place], [place] [address]    Culture   Final    NO GROWTH 5 DAYS Performed at [facility], [address], [place], [place] [address]    Report Status 01/27/2024 FINAL  Final  Resp panel by RT-PCR (RSV, Flu A&B, Covid) Anterior Nasal Swab     Status: None   Collection Time: 01/23/24  1:52 AM   Specimen: Anterior Nasal Swab  Result Value Ref Range Status   SARS Coronavirus 2 by RT PCR NEGATIVE NEGATIVE Final    Comment: (NOTE) SARS-CoV-2 target nucleic acids are NOT DETECTED.  The SARS-CoV-2 RNA is generally detectable in upper respiratory specimens during the acute phase of infection. The lowest concentration of SARS-CoV-2 viral copies this assay can detect is 138 copies/mL. A negative result does not preclude SARS-Cov-2 infection and should not be used as the sole basis for treatment or other patient management decisions. A negative result may occur with  improper specimen collection/handling, submission of specimen other than nasopharyngeal swab, presence of viral  mutation(s) within the areas targeted by this assay, and inadequate number of viral copies(<138 copies/mL). A negative result must be combined with clinical observations, patient history, and epidemiological information. The expected result is Negative.  Fact Sheet for Patients:  BloggerCourse.com  Fact Sheet for Healthcare Providers:  SeriousBroker.it  This test is no t yet approved or cleared by the [place] FDA and  has been authorized for detection and/or diagnosis of SARS-CoV-2 by FDA under an Emergency Use Authorization (EUA). This EUA will remain  in effect (meaning this test can be used) for the duration of the COVID-19 declaration under Section 564(b)(1) of the Act, 21 U.S.C.section 360bbb-3(b)(1), unless the authorization is terminated  or revoked sooner.       Influenza A by PCR NEGATIVE NEGATIVE Final   Influenza B by PCR NEGATIVE NEGATIVE Final    Comment: (NOTE) The Xpert Xpress SARS-CoV-2/FLU/RSV plus assay is intended as an aid in the diagnosis of influenza from Nasopharyngeal swab specimens and should not be used as a sole basis for treatment. Nasal washings and aspirates are unacceptable for Xpert Xpress SARS-CoV-2/FLU/RSV testing.  Fact Sheet for Patients: BloggerCourse.com  Fact Sheet for Healthcare Providers: SeriousBroker.it  This test is not yet approved or cleared by the [place] FDA and has been authorized for detection and/or diagnosis of SARS-CoV-2 by FDA under an Emergency Use Authorization (EUA). This EUA will remain in effect (meaning this test can be used) for the duration of the COVID-19 declaration under Section 564(b)(1) of the Act, 21 U.S.C. section 360bbb-3(b)(1), unless the authorization is terminated or revoked.     Resp Syncytial Virus by PCR NEGATIVE  NEGATIVE Final    Comment: (NOTE) Fact Sheet for  Patients: BloggerCourse.com  Fact Sheet for Healthcare Providers: SeriousBroker.it  This test is not yet approved or cleared by the [place] FDA and has been authorized for detection and/or diagnosis of SARS-CoV-2 by FDA under an Emergency Use Authorization (EUA). This EUA will remain in effect (meaning this test can be used) for the duration of the COVID-19 declaration under Section 564(b)(1) of the Act, 21 U.S.C. section 360bbb-3(b)(1), unless the authorization is terminated or revoked.  Performed at [facility], [address] [place], [place] [address]      Labs: BNP (last 3 results) No results for input(s): "BNP" in the last 8760 hours. Basic Metabolic Panel: Recent Labs  Lab 01/22/24 1332 01/23/24 0555 01/24/24 0441  NA 140 140 138  K 3.8 3.9 4.0  CL 103 107 106  CO2 28 25 25   GLUCOSE 153* 125* 148*  BUN 13 13 17   CREATININE 0.86 1.03 0.99  CALCIUM 9.2 8.8* 8.7*   Liver Function Tests: Recent Labs  Lab 01/22/24 1332 01/24/24 0441  AST 17 14*  ALT 13 7  ALKPHOS 68 58  BILITOT 0.5 0.6  PROT 7.2 6.2*  ALBUMIN 3.5 3.0*   Recent Labs  Lab 01/22/24 1332  LIPASE 23   No results for input(s): "AMMONIA" in the last 168 hours. CBC: Recent Labs  Lab 01/22/24 1332 01/23/24 0555 01/24/24 0441  WBC 5.6 6.4 6.0  NEUTROABS 3.3  --   --   HGB 14.9 13.8 13.7  HCT 46.5 43.4 43.8  MCV 93.2 93.9 95.6  PLT 314 299 279   Cardiac Enzymes: No results for input(s): "CKTOTAL", "CKMB", "CKMBINDEX", "TROPONINI" in the last 168 hours. BNP: Invalid input(s): "POCBNP" CBG: Recent Labs  Lab 01/26/24 0756 01/26/24 1125 01/26/24 1650 01/26/24 2235 01/27/24 0751  GLUCAP 124* 141* 131* 126* 134*   D-Dimer No results for input(s): "DDIMER" in the last 72 hours. Hgb A1c No results for input(s): "HGBA1C" in the last 72 hours. Lipid Profile No results for input(s): "CHOL", "HDL", "LDLCALC",  "TRIG", "CHOLHDL", "LDLDIRECT" in the last 72 hours. Thyroid function studies No results for input(s): "TSH", "T4TOTAL", "T3FREE", "THYROIDAB" in the last 72 hours.  Invalid input(s): "FREET3" Anemia work up No results for input(s): "VITAMINB12", "FOLATE", "FERRITIN", "TIBC", "IRON", "RETICCTPCT" in the last 72 hours. Urinalysis    Component Value Date/Time   COLORURINE YELLOW 01/22/2024 1616   APPEARANCEUR HAZY (A) 01/22/2024 1616   LABSPEC 1.011 01/22/2024 1616   PHURINE 7.0 01/22/2024 1616   GLUCOSEU NEGATIVE 01/22/2024 1616   GLUCOSEU 100 (A) 02/06/2019 0813   HGBUR NEGATIVE 01/22/2024 1616   BILIRUBINUR NEGATIVE 01/22/2024 1616   KETONESUR 5 (A) 01/22/2024 1616   PROTEINUR NEGATIVE 01/22/2024 1616   UROBILINOGEN 1.0 02/06/2019 0813   NITRITE NEGATIVE 01/22/2024 1616   LEUKOCYTESUR LARGE (A) 01/22/2024 1616   Sepsis Labs Recent Labs  Lab 01/22/24 1332 01/23/24 0555 01/24/24 0441  WBC 5.6 6.4 6.0   Microbiology Recent Results (from the past 240 hours)  Urine Culture     Status: Abnormal   Collection Time: 01/22/24  4:16 PM   Specimen: Urine, Random  Result Value Ref Range Status   Specimen Description   Final    URINE, RANDOM Performed at [facility], [address], [place], [place] [address]    Special Requests   Final    NONE Reflexed from [phone] [id number] Performed at [facility], [address]  [name], [place] [address]    Culture (A)  Final    >=100,000 COLONIES/mL KLEBSIELLA PNEUMONIAE Confirmed Extended Spectrum Beta-Lactamase Producer (ESBL).  In bloodstream infections from ESBL organisms, carbapenems are preferred over piperacillin/tazobactam. They are shown to have a lower risk of mortality.    Report Status 01/24/2024 FINAL  Final   Organism ID, Bacteria KLEBSIELLA PNEUMONIAE (A)  Final      Susceptibility   Klebsiella pneumoniae - MIC*    AMPICILLIN >=32 RESISTANT Resistant     CEFAZOLIN >=64 RESISTANT Resistant      CEFEPIME 0.25 SENSITIVE Sensitive     CEFTRIAXONE >=64 RESISTANT Resistant     CIPROFLOXACIN 2 RESISTANT Resistant     GENTAMICIN <=1 SENSITIVE Sensitive     IMIPENEM <=0.25 SENSITIVE Sensitive     NITROFURANTOIN 128 RESISTANT Resistant     TRIMETH/SULFA >=320 RESISTANT Resistant     AMPICILLIN/SULBACTAM >=32 RESISTANT Resistant     PIP/TAZO >=128 RESISTANT Resistant ug/mL    * >=100,000 COLONIES/mL KLEBSIELLA PNEUMONIAE  Culture, blood (routine x 2)     Status: None   Collection Time: 01/22/24  5:04 PM   Specimen: BLOOD RIGHT ARM  Result Value Ref Range Status   Specimen Description   Final    BLOOD RIGHT ARM Performed at [facility], [address], [place], [place] [address]    Special Requests   Final    BOTTLES DRAWN AEROBIC AND ANAEROBIC Blood Culture results may not be optimal due to an inadequate volume of blood received in culture bottles Performed at [facility], [address], [place], [place] [address]    Culture   Final    NO GROWTH 5 DAYS Performed at [facility], [address], [place], [place] [address]    Report Status 01/27/2024 FINAL  Final  Culture, blood (routine x 2)     Status: None   Collection Time: 01/22/24  5:11 PM   Specimen: BLOOD RIGHT HAND  Result Value Ref Range Status   Specimen Description   Final    BLOOD RIGHT HAND Performed at [facility], [address] [place], [place] [address]    Special Requests   Final    BOTTLES DRAWN AEROBIC ONLY Blood Culture results may not be optimal due to an inadequate volume of blood received in culture bottles Performed at [facility], [address] [place], [place] [address]    Culture   Final    NO GROWTH 5 DAYS Performed at [facility], [address] [place], [place] [address]    Report Status 01/27/2024 FINAL  Final  Resp panel by RT-PCR (RSV, Flu A&B, Covid) Anterior Nasal Swab     Status: None   Collection Time: 01/23/24   1:52 AM   Specimen: Anterior Nasal Swab  Result Value Ref Range Status   SARS Coronavirus 2 by RT PCR NEGATIVE NEGATIVE Final    Comment: (NOTE) SARS-CoV-2 target nucleic acids are NOT DETECTED.  The SARS-CoV-2 RNA is generally detectable in upper respiratory specimens during the acute phase of infection. The lowest concentration of SARS-CoV-2 viral copies this assay can detect is 138 copies/mL. A negative result does not preclude SARS-Cov-2 infection and should not be used as the sole basis for treatment or other patient management decisions. A negative result may occur with  improper specimen collection/handling, submission of specimen other than nasopharyngeal swab, presence of viral mutation(s) within the areas targeted by this assay, and  inadequate number of viral copies(<138 copies/mL). A negative result must be combined with clinical observations, patient history, and epidemiological information. The expected result is Negative.  Fact Sheet for Patients:  BloggerCourse.com  Fact Sheet for Healthcare Providers:  SeriousBroker.it  This test is no t yet approved or cleared by the [place] FDA and  has been authorized for detection and/or diagnosis of SARS-CoV-2 by FDA under an Emergency Use Authorization (EUA). This EUA will remain  in effect (meaning this test can be used) for the duration of the COVID-19 declaration under Section 564(b)(1) of the Act, 21 U.S.C.section 360bbb-3(b)(1), unless the authorization is terminated  or revoked sooner.       Influenza A by PCR NEGATIVE NEGATIVE Final   Influenza B by PCR NEGATIVE NEGATIVE Final    Comment: (NOTE) The Xpert Xpress SARS-CoV-2/FLU/RSV plus assay is intended as an aid in the diagnosis of influenza from Nasopharyngeal swab specimens and should not be used as a sole basis for treatment. Nasal washings and aspirates are unacceptable for Xpert Xpress  SARS-CoV-2/FLU/RSV testing.  Fact Sheet for Patients: BloggerCourse.com  Fact Sheet for Healthcare Providers: SeriousBroker.it  This test is not yet approved or cleared by the [place] FDA and has been authorized for detection and/or diagnosis of SARS-CoV-2 by FDA under an Emergency Use Authorization (EUA). This EUA will remain in effect (meaning this test can be used) for the duration of the COVID-19 declaration under Section 564(b)(1) of the Act, 21 U.S.C. section 360bbb-3(b)(1), unless the authorization is terminated or revoked.     Resp Syncytial Virus by PCR NEGATIVE NEGATIVE Final    Comment: (NOTE) Fact Sheet for Patients: BloggerCourse.com  Fact Sheet for Healthcare Providers: SeriousBroker.it  This test is not yet approved or cleared by the [place] FDA and has been authorized for detection and/or diagnosis of SARS-CoV-2 by FDA under an Emergency Use Authorization (EUA). This EUA will remain in effect (meaning this test can be used) for the duration of the COVID-19 declaration under Section 564(b)(1) of the Act, 21 U.S.C. section 360bbb-3(b)(1), unless the authorization is terminated or revoked.  Performed at [facility] [place], [address] [place], [place] [address]     Please note: You were cared for by a hospitalist during your hospital stay. Once you are discharged, your primary care physician will handle any further medical issues. Please note that NO REFILLS for any discharge medications will be authorized once you are discharged, as it is imperative that you return to your primary care physician (or establish a relationship with a primary care physician if you do not have one) for your post hospital discharge needs so that they can reassess your need for medications and monitor your lab values.    Time coordinating discharge: 40  minutes  SIGNED:   Burnadette Pop, MD  [facility] 01/27/2024, 10:25 AM Pager [phone]  If 7PM-7AM, please contact night-coverage [email or web address] [facility] Physician Discharge Summary  Robert [name] BJY:[id number] DOB: 1946-09-23 DOA: 01/22/2024  PCP: Noel Gerold, NP  Admit date: 01/22/2024 Discharge date: 01/27/2024  Admitted From: Home Disposition:  Home  Discharge Condition:Stable CODE STATUS:FULL, DNR, Comfort Care Diet recommendation: Heart Healthy / Carb Modified / Regular / Dysphagia   Brief/Interim Summary:   Following problems were addressed during the hospitalization:   Discharge Diagnoses:  Principal Problem:   Urinary tract infection due to ESBL Klebsiella    Discharge Instructions  Discharge Instructions     Diet Carb Modified   Complete by: As directed  Discharge instructions   Complete by: As directed    1)Please take your medications as instructed 2)Follow up with your PCP in a week   Discharge wound care:   Complete by: As directed    As per wound care nurse   Increase activity slowly   Complete by: As directed       Allergies as of 01/27/2024       Reactions   Fluconazole Rash   Propofol Other (See Comments)   "hiccups for weeks" Other reaction(s): Hiccups   Lisinopril Cough   Sulfamethoxazole-trimethoprim Diarrhea   Fluoxetine Nausea Only, Other (See Comments)   Hallucinations, Feeling nervous, Nausea, Tachycardia        Medication List     STOP taking these medications    cetirizine 10 MG tablet Commonly known as: ZYRTEC       TAKE these medications    acetaminophen 500 MG tablet Commonly known as: TYLENOL Take 1,000 mg by mouth every 6 (six) hours as needed for mild pain (pain score 1-3), moderate pain (pain score 4-6) or fever.   apixaban 5 MG Tabs tablet Commonly known as: ELIQUIS Take 1 tablet (5 mg total) by mouth 2 (two) times daily.   ascorbic acid 500 MG tablet Commonly known as:  VITAMIN C TAKE ONE TABLET BY MOUTH DAILY FOR PREVENTION OF UTI   aspirin EC 81 MG tablet Take 81 mg by mouth daily. Swallow whole.   bacitracin 500 UNIT/GM ointment Apply 1 Application topically daily as needed for wound care.   busPIRone 10 MG tablet Commonly known as: BUSPAR Take 5-10 mg by mouth See admin instructions. Take 10 mg by mouth in the morning. Take 5 mg by mouth in the afternoon and at bedtime.   Carbidopa-Levodopa ER 25-100 MG tablet controlled release Commonly known as: SINEMET CR 2-3 tablets See admin instructions. Take 3 tablets by mouth in the morning and afternoon. Take 2 tablets in the evening.   cephALEXin 250 MG capsule Commonly known as: KEFLEX Take 250 mg by mouth at bedtime.   Cholecalciferol 50 MCG (2000 UT) Tabs Take 2,000 Units by mouth daily.   Dupilumab 300 MG/2ML Soaj Inject 600 mg into the skin.   HYDROCORTISONE ACE (RECTAL) 30 MG Supp Place 30 mg rectally at bedtime.   melatonin 3 MG Tabs tablet Take 6 mg by mouth at bedtime.   memantine 10 MG tablet Commonly known as: NAMENDA Take 20 mg by mouth at bedtime.   metFORMIN 500 MG tablet Commonly known as: GLUCOPHAGE Take 500 mg by mouth at bedtime.   miconazole 2 % powder Commonly known as: MICOTIN Apply 1 Application topically daily.   Nuplazid 34 MG Caps Generic drug: Pimavanserin Tartrate Take 34 mg by mouth daily.   nystatin-triamcinolone ointment Commonly known as: MYCOLOG Apply 1 application  topically as needed (rash/yeast).   omeprazole 20 MG capsule Commonly known as: PRILOSEC Take 20 mg by mouth daily.   oxybutynin 5 MG tablet Commonly known as: DITROPAN Take 5 mg by mouth in the morning and at bedtime.   polyethylene glycol 17 g packet Commonly known as: MIRALAX / GLYCOLAX Take 17 g by mouth daily.   rasagiline 1 MG Tabs tablet Commonly known as: AZILECT Take 1 mg by mouth daily.   senna-docusate 8.6-50 MG tablet Commonly known as: Senokot-S Take 2  tablets by mouth daily as needed for mild constipation or moderate constipation.   simvastatin 40 MG tablet Commonly known as: ZOCOR Take 20 mg by mouth at  bedtime.   tacrolimus 0.1 % ointment Commonly known as: PROTOPIC Apply 1 application. topically See admin instructions. Apply to groin, armpit, and bottom when skin is broken out twice a day as needed   triamcinolone cream 0.1 % Commonly known as: KENALOG Apply 1 Application topically daily as needed (rash).   zinc oxide 20 % ointment Apply 1 Application topically See admin instructions. Every time there is a diaper change               Discharge Care Instructions  (From admission, onward)           Start     Ordered   01/27/24 0000  Discharge wound care:       Comments: As per wound care nurse   01/27/24 1025            Follow-up Information     Noel Gerold, NP. Schedule an appointment as soon as possible for a visit in 1 week(s).   Contact information: [address] [place] [address] [phone]                Allergies  Allergen Reactions   Fluconazole Rash   Propofol Other (See Comments)    "hiccups for weeks"  Other reaction(s): Hiccups   Lisinopril Cough   Sulfamethoxazole-Trimethoprim Diarrhea   Fluoxetine Nausea Only and Other (See Comments)    Hallucinations, Feeling nervous, Nausea, Tachycardia    Consultations:    Procedures/Studies: DG Chest Port 1 View Result Date: 01/22/2024 CLINICAL DATA:  Fever EXAM: PORTABLE CHEST 1 VIEW COMPARISON:  09/15/2023 FINDINGS: The heart size and mediastinal contours are within normal limits. No focal airspace consolidation, pleural effusion, or pneumothorax. Advanced degenerative changes of both shoulders. IMPRESSION: No active disease. Electronically Signed   By: Duanne Guess D.O.   On: 01/22/2024 13:47      Subjective:   Discharge Exam: Vitals:   01/26/24 2051 01/27/24 0618  BP: 135/74 (!) 131/93  Pulse: 75 82   Resp: 20 18  Temp: 98.2 F (36.8 C) 98 F (36.7 C)  SpO2: 97% 97%   Vitals:   01/26/24 0617 01/26/24 1431 01/26/24 2051 01/27/24 0618  BP: (!) 155/75 131/84 135/74 (!) 131/93  Pulse: 79 76 75 82  Resp: 19 19 20 18   Temp: 98.2 F (36.8 C) 98.2 F (36.8 C) 98.2 F (36.8 C) 98 F (36.7 C)  TempSrc: Oral     SpO2: 97% 94% 97% 97%  Weight:      Height:        General: Pt is alert, awake, not in acute distress Cardiovascular: RRR, S1/S2 +, no rubs, no gallops Respiratory: CTA bilaterally, no wheezing, no rhonchi Abdominal: Soft, NT, ND, bowel sounds + Extremities: no edema, no cyanosis    The results of significant diagnostics from this hospitalization (including imaging, microbiology, ancillary and laboratory) are listed below for reference.     Microbiology: Recent Results (from the past 240 hours)  Urine Culture     Status: Abnormal   Collection Time: 01/22/24  4:16 PM   Specimen: Urine, Random  Result Value Ref Range Status   Specimen Description   Final    URINE, RANDOM Performed at [facility], [address], [place], [place] [address]    Special Requests   Final    NONE Reflexed from [id number] Performed at [facility], [address] [place], [place] [address]    Culture (A)  Final    >=100,000 COLONIES/mL KLEBSIELLA  PNEUMONIAE Confirmed Extended Spectrum Beta-Lactamase Producer (ESBL).  In bloodstream infections from ESBL organisms, carbapenems are preferred over piperacillin/tazobactam. They are shown to have a lower risk of mortality.    Report Status 01/24/2024 FINAL  Final   Organism ID, Bacteria KLEBSIELLA PNEUMONIAE (A)  Final      Susceptibility   Klebsiella pneumoniae - MIC*    AMPICILLIN >=32 RESISTANT Resistant     CEFAZOLIN >=64 RESISTANT Resistant     CEFEPIME 0.25 SENSITIVE Sensitive     CEFTRIAXONE >=64 RESISTANT Resistant     CIPROFLOXACIN 2 RESISTANT Resistant     GENTAMICIN <=1 SENSITIVE Sensitive      IMIPENEM <=0.25 SENSITIVE Sensitive     NITROFURANTOIN 128 RESISTANT Resistant     TRIMETH/SULFA >=320 RESISTANT Resistant     AMPICILLIN/SULBACTAM >=32 RESISTANT Resistant     PIP/TAZO >=128 RESISTANT Resistant ug/mL    * >=100,000 COLONIES/mL KLEBSIELLA PNEUMONIAE  Culture, blood (routine x 2)     Status: None   Collection Time: 01/22/24  5:04 PM   Specimen: BLOOD RIGHT ARM  Result Value Ref Range Status   Specimen Description   Final    BLOOD RIGHT ARM Performed at [facility], [address] [place], [place] [address]    Special Requests   Final    BOTTLES DRAWN AEROBIC AND ANAEROBIC Blood Culture results may not be optimal due to an inadequate volume of blood received in culture bottles Performed at [facility], [address], [place], [place] [address]    Culture   Final    NO GROWTH 5 DAYS Performed at [facility], [address], [place], [place] [address]    Report Status 01/27/2024 FINAL  Final  Culture, blood (routine x 2)     Status: None   Collection Time: 01/22/24  5:11 PM   Specimen: BLOOD RIGHT HAND  Result Value Ref Range Status   Specimen Description   Final    BLOOD RIGHT HAND Performed at [facility], [address] [place], [place] [address]    Special Requests   Final    BOTTLES DRAWN AEROBIC ONLY Blood Culture results may not be optimal due to an inadequate volume of blood received in culture bottles Performed at [facility], [address] [place], [place] [address]    Culture   Final    NO GROWTH 5 DAYS Performed at [facility], [address], [place], [place] [address]    Report Status 01/27/2024 FINAL  Final  Resp panel by RT-PCR (RSV, Flu A&B, Covid) Anterior Nasal Swab     Status: None   Collection Time: 01/23/24  1:52 AM   Specimen: Anterior Nasal Swab  Result Value Ref Range Status   SARS Coronavirus 2 by RT PCR NEGATIVE NEGATIVE Final    Comment: (NOTE) SARS-CoV-2  target nucleic acids are NOT DETECTED.  The SARS-CoV-2 RNA is generally detectable in upper respiratory specimens during the acute phase of infection. The lowest concentration of SARS-CoV-2 viral copies this assay can detect is 138 copies/mL. A negative result does not preclude SARS-Cov-2 infection and should not be used as the sole basis for treatment or other patient management decisions. A negative result may occur with  improper specimen collection/handling, submission of specimen other than nasopharyngeal swab, presence of viral mutation(s) within the areas targeted by this assay, and inadequate number of viral copies(<138 copies/mL). A negative result must be combined with clinical observations, patient  history, and epidemiological information. The expected result is Negative.  Fact Sheet for Patients:  BloggerCourse.com  Fact Sheet for Healthcare Providers:  SeriousBroker.it  This test is no t yet approved or cleared by the [place] FDA and  has been authorized for detection and/or diagnosis of SARS-CoV-2 by FDA under an Emergency Use Authorization (EUA). This EUA will remain  in effect (meaning this test can be used) for the duration of the COVID-19 declaration under Section 564(b)(1) of the Act, 21 U.S.C.section 360bbb-3(b)(1), unless the authorization is terminated  or revoked sooner.       Influenza A by PCR NEGATIVE NEGATIVE Final   Influenza B by PCR NEGATIVE NEGATIVE Final    Comment: (NOTE) The Xpert Xpress SARS-CoV-2/FLU/RSV plus assay is intended as an aid in the diagnosis of influenza from Nasopharyngeal swab specimens and should not be used as a sole basis for treatment. Nasal washings and aspirates are unacceptable for Xpert Xpress SARS-CoV-2/FLU/RSV testing.  Fact Sheet for Patients: BloggerCourse.com  Fact Sheet for Healthcare  Providers: SeriousBroker.it  This test is not yet approved or cleared by the [place] FDA and has been authorized for detection and/or diagnosis of SARS-CoV-2 by FDA under an Emergency Use Authorization (EUA). This EUA will remain in effect (meaning this test can be used) for the duration of the COVID-19 declaration under Section 564(b)(1) of the Act, 21 U.S.C. section 360bbb-3(b)(1), unless the authorization is terminated or revoked.     Resp Syncytial Virus by PCR NEGATIVE NEGATIVE Final    Comment: (NOTE) Fact Sheet for Patients: BloggerCourse.com  Fact Sheet for Healthcare Providers: SeriousBroker.it  This test is not yet approved or cleared by the [place] FDA and has been authorized for detection and/or diagnosis of SARS-CoV-2 by FDA under an Emergency Use Authorization (EUA). This EUA will remain in effect (meaning this test can be used) for the duration of the COVID-19 declaration under Section 564(b)(1) of the Act, 21 U.S.C. section 360bbb-3(b)(1), unless the authorization is terminated or revoked.  Performed at [facility], [address], [place], [place] [address]      Labs: BNP (last 3 results) No results for input(s): "BNP" in the last 8760 hours. Basic Metabolic Panel: Recent Labs  Lab 01/22/24 1332 01/23/24 0555 01/24/24 0441  NA 140 140 138  K 3.8 3.9 4.0  CL 103 107 106  CO2 28 25 25   GLUCOSE 153* 125* 148*  BUN 13 13 17   CREATININE 0.86 1.03 0.99  CALCIUM 9.2 8.8* 8.7*   Liver Function Tests: Recent Labs  Lab 01/22/24 1332 01/24/24 0441  AST 17 14*  ALT 13 7  ALKPHOS 68 58  BILITOT 0.5 0.6  PROT 7.2 6.2*  ALBUMIN 3.5 3.0*   Recent Labs  Lab 01/22/24 1332  LIPASE 23   No results for input(s): "AMMONIA" in the last 168 hours. CBC: Recent Labs  Lab 01/22/24 1332 01/23/24 0555 01/24/24 0441  WBC 5.6 6.4 6.0  NEUTROABS 3.3   --   --   HGB 14.9 13.8 13.7  HCT 46.5 43.4 43.8  MCV 93.2 93.9 95.6  PLT 314 299 279   Cardiac Enzymes: No results for input(s): "CKTOTAL", "CKMB", "CKMBINDEX", "TROPONINI" in the last 168 hours. BNP: Invalid input(s): "POCBNP" CBG: Recent Labs  Lab 01/26/24 0756 01/26/24 1125 01/26/24 1650 01/26/24 2235 01/27/24 0751  GLUCAP 124* 141* 131* 126* 134*   D-Dimer No results for input(s): "DDIMER" in the last 72 hours. Hgb A1c No results for input(s): "HGBA1C"  in the last 72 hours. Lipid Profile No results for input(s): "CHOL", "HDL", "LDLCALC", "TRIG", "CHOLHDL", "LDLDIRECT" in the last 72 hours. Thyroid function studies No results for input(s): "TSH", "T4TOTAL", "T3FREE", "THYROIDAB" in the last 72 hours.  Invalid input(s): "FREET3" Anemia work up No results for input(s): "VITAMINB12", "FOLATE", "FERRITIN", "TIBC", "IRON", "RETICCTPCT" in the last 72 hours. Urinalysis    Component Value Date/Time   COLORURINE YELLOW 01/22/2024 1616   APPEARANCEUR HAZY (A) 01/22/2024 1616   LABSPEC 1.011 01/22/2024 1616   PHURINE 7.0 01/22/2024 1616   GLUCOSEU NEGATIVE 01/22/2024 1616   GLUCOSEU 100 (A) 02/06/2019 0813   HGBUR NEGATIVE 01/22/2024 1616   BILIRUBINUR NEGATIVE 01/22/2024 1616   KETONESUR 5 (A) 01/22/2024 1616   PROTEINUR NEGATIVE 01/22/2024 1616   UROBILINOGEN 1.0 02/06/2019 0813   NITRITE NEGATIVE 01/22/2024 1616   LEUKOCYTESUR LARGE (A) 01/22/2024 1616   Sepsis Labs Recent Labs  Lab 01/22/24 1332 01/23/24 0555 01/24/24 0441  WBC 5.6 6.4 6.0   Microbiology Recent Results (from the past 240 hours)  Urine Culture     Status: Abnormal   Collection Time: 01/22/24  4:16 PM   Specimen: Urine, Random  Result Value Ref Range Status   Specimen Description   Final    URINE, RANDOM Performed at [facility], [address], [place], [place] [address]    Special Requests   Final    NONE Reflexed from [id number] Performed at [facility], [address] [place], [place] [address]    Culture (A)  Final    >=100,000 COLONIES/mL KLEBSIELLA PNEUMONIAE Confirmed Extended Spectrum Beta-Lactamase Producer (ESBL).  In bloodstream infections from ESBL organisms, carbapenems are preferred over piperacillin/tazobactam. They are shown to have a lower risk of mortality.    Report Status 01/24/2024 FINAL  Final   Organism ID, Bacteria KLEBSIELLA PNEUMONIAE (A)  Final      Susceptibility   Klebsiella pneumoniae - MIC*    AMPICILLIN >=32 RESISTANT Resistant     CEFAZOLIN >=64 RESISTANT Resistant     CEFEPIME 0.25 SENSITIVE Sensitive     CEFTRIAXONE >=64 RESISTANT Resistant     CIPROFLOXACIN 2 RESISTANT Resistant     GENTAMICIN <=1 SENSITIVE Sensitive     IMIPENEM <=0.25 SENSITIVE Sensitive     NITROFURANTOIN 128 RESISTANT Resistant     TRIMETH/SULFA >=320 RESISTANT Resistant     AMPICILLIN/SULBACTAM >=32 RESISTANT Resistant     PIP/TAZO >=128 RESISTANT Resistant ug/mL    * >=100,000 COLONIES/mL KLEBSIELLA PNEUMONIAE  Culture, blood (routine x 2)     Status: None   Collection Time: 01/22/24  5:04 PM   Specimen: BLOOD RIGHT ARM  Result Value Ref Range Status   Specimen Description   Final    BLOOD RIGHT ARM Performed at [facility], [address], [place], [place] [address]    Special Requests   Final    BOTTLES DRAWN AEROBIC AND ANAEROBIC Blood Culture results may not be optimal due to an inadequate volume of blood received in culture bottles Performed at [facility], [address] [place], [place] [address]    Culture   Final    NO GROWTH 5 DAYS Performed at [facility], [address], [place], [place] [address]    Report Status 01/27/2024 FINAL  Final  Culture, blood (routine x 2)     Status: None   Collection Time: 01/22/24  5:11 PM   Specimen: BLOOD RIGHT HAND  Result Value Ref  Range Status   Specimen Description   Final    BLOOD RIGHT HAND Performed at [facility], [address], [place], [place] [address]    Special Requests   Final    BOTTLES DRAWN AEROBIC ONLY Blood Culture results may not be optimal due to an inadequate volume of blood received in culture bottles Performed at [facility], [address], [place], [place] [address]    Culture   Final    NO GROWTH 5 DAYS Performed at [facility], [address], [place], [place] [address]    Report Status 01/27/2024 FINAL  Final  Resp panel by RT-PCR (RSV, Flu A&B, Covid) Anterior Nasal Swab     Status: None   Collection Time: 01/23/24  1:52 AM   Specimen: Anterior Nasal Swab  Result Value Ref Range Status   SARS Coronavirus 2 by RT PCR NEGATIVE NEGATIVE Final    Comment: (NOTE) SARS-CoV-2 target nucleic acids are NOT DETECTED.  The SARS-CoV-2 RNA is generally detectable in upper respiratory specimens during the acute phase of infection. The lowest concentration of SARS-CoV-2 viral copies this assay can detect is 138 copies/mL. A negative result does not preclude SARS-Cov-2 infection and should not be used as the sole basis for treatment or other patient management decisions. A negative result may occur with  improper specimen collection/handling, submission of specimen other than nasopharyngeal swab, presence of viral mutation(s) within the areas targeted by this assay, and inadequate number of viral copies(<138 copies/mL). A negative result must be combined with clinical observations, patient history, and epidemiological information. The expected result is Negative.  Fact Sheet for Patients:  BloggerCourse.com  Fact Sheet for Healthcare Providers:  SeriousBroker.it  This test is no t yet approved or cleared by the [place] FDA and  has been authorized for detection and/or diagnosis of SARS-CoV-2 by FDA under an Emergency Use Authorization (EUA). This EUA will remain  in effect (meaning this  test can be used) for the duration of the COVID-19 declaration under Section 564(b)(1) of the Act, 21 U.S.C.section 360bbb-3(b)(1), unless the authorization is terminated  or revoked sooner.       Influenza A by PCR NEGATIVE NEGATIVE Final   Influenza B by PCR NEGATIVE NEGATIVE Final    Comment: (NOTE) The Xpert Xpress SARS-CoV-2/FLU/RSV plus assay is intended as an aid in the diagnosis of influenza from Nasopharyngeal swab specimens and should not be used as a sole basis for treatment. Nasal washings and aspirates are unacceptable for Xpert Xpress SARS-CoV-2/FLU/RSV testing.  Fact Sheet for Patients: BloggerCourse.com  Fact Sheet for Healthcare Providers: SeriousBroker.it  This test is not yet approved or cleared by the [place] FDA and has been authorized for detection and/or diagnosis of SARS-CoV-2 by FDA under an Emergency Use Authorization (EUA). This EUA will remain in effect (meaning this test can be used) for the duration of the COVID-19 declaration under Section 564(b)(1) of the Act, 21 U.S.C. section 360bbb-3(b)(1), unless the authorization is terminated or revoked.     Resp Syncytial Virus by PCR NEGATIVE NEGATIVE Final    Comment: (NOTE) Fact Sheet for Patients: BloggerCourse.com  Fact Sheet for Healthcare Providers: SeriousBroker.it  This test is not yet approved or cleared by the [place] FDA and has been authorized for detection and/or diagnosis of SARS-CoV-2 by FDA under an Emergency Use Authorization (EUA). This EUA will remain in effect (meaning this test can be used) for the duration of the COVID-19 declaration under  Section 564(b)(1) of the Act, 21 U.S.C. section 360bbb-3(b)(1), unless the authorization is terminated or revoked.  Performed at [facility], [address], [place], [place] [address]     Please note: You  were cared for by a hospitalist during your hospital stay. Once you are discharged, your primary care physician will handle any further medical issues. Please note that NO REFILLS for any discharge medications will be authorized once you are discharged, as it is imperative that you return to your primary care physician (or establish a relationship with a primary care physician if you do not have one) for your post hospital discharge needs so that they can reassess your need for medications and monitor your lab values.    Time coordinating discharge: 40 minutes  SIGNED:   Burnadette Pop, MD  [facility] 01/27/2024, 10:25 AM Pager [phone]  If 7PM-7AM, please contact night-coverage [email or web address] Password [email or web address]

## 2024-03-10 ENCOUNTER — Other Ambulatory Visit: Payer: Self-pay | Admitting: *Deleted

## 2024-03-10 NOTE — Progress Notes (Signed)
Carotid US order placed.

## 2024-03-14 ENCOUNTER — Other Ambulatory Visit (HOSPITAL_COMMUNITY): Payer: Self-pay | Admitting: Urology

## 2024-03-14 DIAGNOSIS — R339 Retention of urine, unspecified: Secondary | ICD-10-CM

## 2024-03-21 ENCOUNTER — Ambulatory Visit (HOSPITAL_COMMUNITY)
Admission: RE | Admit: 2024-03-21 | Discharge: 2024-03-21 | Disposition: A | Discharge status: Home / Self Care | Payer: No Typology Code available for payment source | Source: Ambulatory Visit | Attending: Vascular Surgery | Admitting: Vascular Surgery

## 2024-03-21 ENCOUNTER — Ambulatory Visit (INDEPENDENT_AMBULATORY_CARE_PROVIDER_SITE_OTHER): Payer: No Typology Code available for payment source | Attending: Vascular Surgery | Admitting: Vascular Surgery

## 2024-03-21 ENCOUNTER — Encounter: Payer: Self-pay | Admitting: Vascular Surgery

## 2024-03-21 VITALS — BP 132/72 | HR 86 | Temp 98.2°F | Ht 74.0 in | Wt 218.0 lb

## 2024-03-21 DIAGNOSIS — I6522 Occlusion and stenosis of left carotid artery: Secondary | ICD-10-CM | POA: Insufficient documentation

## 2024-03-21 NOTE — Progress Notes (Signed)
 Patient name: Robert [name] MRN: [id number] DOB: Sep 17, 1946 Sex: male  REASON FOR VISIT: 1 year follow-up for interval carotid surveillance after previous left TCAR  HPI: Robert [name] is a 78 y.o. male with multiple medical problems that presents for 1 year interval follow-up after left TCAR.  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  He did well during the procedure and woke up neurologically at his baseline.  Unfortunately on the evening after his TCAR he was noted to have some right-sided weakness and a code stroke was called.  He was found to have some small left MCA and basal ganglia infarct.  Fortunately he recovered quickly and went to rehab.   He is here today with his wife.  Taking Eliquis  and aspirin  and simvastatin .  No new neurologic symptoms since last visit last year. 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  The wife is still taking care of him at home with the help of an aide.   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   Diabetes mellitus without complication (HCC)    Diverticulosis    Frequency of urination    GERD (gastroesophageal reflux disease)    Gross hematuria    History of acute pyelonephritis    10-13-2012   History of CVA with residual deficit    2008--  left side of body weakness and foot drop (wears leg brace and uses cane)   History of DVT of lower extremity    2008--  cva   Hypercholesteremia    Hyperlipidemia    Hyperlipidemia    Hypertension    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 2008   Left leg DVT (HCC)    Neuromuscular disorder (HCC)    Parkinsons   S/P insertion of IVC (inferior vena caval) filter    2008   Urethral stricture    Urgency of urination    Urinary retention     Weakness of left side of body    secondary to cva 2008   Wears glasses    Wears hearing aid    bilateral-- wears intermittantly    Past Surgical History:  Procedure Laterality Date   CYSTOSCOPY WITH RETROGRADE URETHROGRAM N/A 10/23/2015   Procedure: CYSTOSCOPY WITH RETROGRADE URETHROGRAM;  Surgeon: Ann Barnacle, MD;  Location: [facility];  Service: Urology;  Laterality: N/A;   CYSTOSCOPY WITH URETHRAL DILATATION N/A 10/23/2015   Procedure: CYSTOSCOPY WITH URETHRAL BALLOON DILATATION;  Surgeon: Ann Barnacle, MD;  Location: [facility];  Service: Urology;  Laterality: N/A;  BALLOON DILATION    IVC FILTER PLACEMENT ([facility] HX)  2008   LAPAROSCOPIC CHOLECYSTECTOMY SINGLE PORT N/A 01/09/2021   Procedure: LAPAROSCOPIC CHOLECYSTECTOMY WITH IOC;  Surgeon: Candyce Champagne, MD;  Location: [facility];  Service: General;  Laterality: N/A;  90 MIN   TRANSCAROTID ARTERY REVASCULARIZATION  Left 05/12/2021   Procedure: LEFT TRANSCAROTID ARTERY REVASCULARIZATION;  Surgeon: Young Hensen, MD;  Location: [facility] OR;  Service: Vascular;  Laterality: Left;    Family History  Problem Relation Age of Onset   Diabetes Sister    Lung cancer Brother        Post 9/11 voluntary work in [employer]   Prostate cancer Brother  Other Mother        passed away young- non medical   Alzheimer's disease Father    Healthy Son     SOCIAL HISTORY: Social History   Tobacco Use   Smoking status: Former    Current packs/day: 0.00    Types: Cigarettes    Start date: 10/26/1960    Quit date: 10/26/1970    Years since quitting: 53.4   Smokeless tobacco: Never  Substance Use Topics   Alcohol use: No    Allergies  Allergen Reactions   Fluconazole Rash   Propofol  Other (See Comments)    "hiccups for weeks"  Other reaction(s): Hiccups   Lisinopril Cough   Sulfamethoxazole -Trimethoprim  Diarrhea   Fluoxetine Nausea Only and Other (See Comments)    Hallucinations, Feeling nervous, Nausea,  Tachycardia    Current Outpatient Medications  Medication Sig Dispense Refill   acetaminophen  (TYLENOL ) 500 MG tablet Take 1,000 mg by mouth every 6 (six) hours as needed for mild pain (pain score 1-3), moderate pain (pain score 4-6) or fever.     apixaban  (ELIQUIS ) 5 MG TABS tablet Take 1 tablet (5 mg total) by mouth 2 (two) times daily. 60 tablet 0   ascorbic acid (VITAMIN C) 500 MG tablet TAKE ONE TABLET BY MOUTH DAILY FOR PREVENTION OF UTI     aspirin  EC 81 MG tablet Take 81 mg by mouth daily. Swallow whole.     bacitracin 500 UNIT/GM ointment Apply 1 Application topically daily as needed for wound care.     busPIRone  (BUSPAR ) 10 MG tablet Take 5-10 mg by mouth See admin instructions. Take 10 mg by mouth in the morning. Take 5 mg by mouth in the afternoon and at bedtime.     Carbidopa -Levodopa  ER (SINEMET  CR) 25-100 MG tablet controlled release 2-3 tablets See admin instructions. Take 3 tablets by mouth in the morning and afternoon. Take 2 tablets in the evening.     cephALEXin  (KEFLEX ) 250 MG capsule Take 250 mg by mouth at bedtime.     Cholecalciferol 50 MCG (2000 UT) TABS Take 2,000 Units by mouth daily.     Dupilumab 300 MG/2ML SOAJ Inject 600 mg into the skin. (Patient not taking: Reported on 01/22/2024)     HYDROCORTISONE  ACE, RECTAL, 30 MG SUPP Place 30 mg rectally at bedtime.     melatonin 3 MG TABS tablet Take 6 mg by mouth at bedtime.     memantine  (NAMENDA ) 10 MG tablet Take 20 mg by mouth at bedtime.     metFORMIN  (GLUCOPHAGE ) 500 MG tablet Take 500 mg by mouth at bedtime.     miconazole (MICOTIN) 2 % powder Apply 1 Application topically daily.     nystatin-triamcinolone ointment (MYCOLOG) Apply 1 application  topically as needed (rash/yeast).     omeprazole (PRILOSEC) 20 MG capsule Take 20 mg by mouth daily.     oxybutynin  (DITROPAN ) 5 MG tablet Take 5 mg by mouth in the morning and at bedtime.     Pimavanserin  Tartrate (NUPLAZID ) 34 MG CAPS Take 34 mg by mouth daily.      polyethylene glycol (MIRALAX  / GLYCOLAX ) 17 g packet Take 17 g by mouth daily.     rasagiline  (AZILECT ) 1 MG TABS tablet Take 1 mg by mouth daily.     senna-docusate (SENOKOT-S) 8.6-50 MG tablet Take 2 tablets by mouth daily as needed for mild constipation or moderate constipation. (Patient not taking: Reported on 01/22/2024)     simvastatin  (ZOCOR ) 40 MG tablet Take 20  mg by mouth at bedtime.     tacrolimus (PROTOPIC) 0.1 % ointment Apply 1 application. topically See admin instructions. Apply to groin, armpit, and bottom when skin is broken out twice a day as needed     triamcinolone cream (KENALOG) 0.1 % Apply 1 Application topically daily as needed (rash).     zinc  oxide 20 % ointment Apply 1 Application topically See admin instructions. Every time there is a diaper change 56.7 g 0   No current facility-administered medications for this visit.    REVIEW OF SYSTEMS:  [X]  denotes positive finding, [ ]  denotes negative finding Cardiac  Comments:  Chest pain or chest pressure:    Shortness of breath upon exertion:    Short of breath when lying flat:    Irregular heart rhythm:        Vascular    Pain in calf, thigh, or hip brought on by ambulation:    Pain in feet at night that wakes you up from your sleep:     Blood clot in your veins:    Leg swelling:         Pulmonary    Oxygen at home:    Productive cough:     Wheezing:         Neurologic    Sudden weakness in arms or legs:     Sudden numbness in arms or legs:     Sudden onset of difficulty speaking or slurred speech:    Temporary loss of vision in one eye:     Problems with dizziness:         Gastrointestinal    Blood in stool:     Vomited blood:         Genitourinary    Burning when urinating:     Blood in urine:        Psychiatric    Major depression:         Hematologic    Bleeding problems:    Problems with blood clotting too easily:        Skin    Rashes or ulcers:        Constitutional    Fever or  chills:      PHYSICAL EXAM: There were no vitals filed for this visit.   GENERAL: The patient is a well-nourished male, in no acute distress. The vital signs are documented above. CARDIAC: There is a regular rate and rhythm.  VASCULAR:  Left neck incision c/d/i PULMONARY: No respiratory distress. MUSCULOSKELETAL: There are no major deformities or cyanosis. NEUROLOGIC:  Left upper extremity 0 out of 5 but able to lift his left leg off the wheelchair from his previous stroke years ago Right upper extremity 4+ out of 5 and right lower extremity 4 out of 5  DATA:   Carotid duplex today shows known right ICA occlusion and a widely patent left carotid stent after TCAR.  Assessment/Plan:  78 year old male status post left TCAR on 05/02/21 for asymptomatic high-grade greater than 80% stenosis in the setting of contralateral ICA occlusion that presents for 1 year follow-up for ongoing carotid surveillance.  He had a small stroke in the hospital with small left MCA/basal ganglia infarcts after the TCAR.  Fortunately he went to CIR and was able to rehabilitate and is at home with assitance.  Discussed his duplex today shows widely patent left carotid stent following his TCAR.  This looks widely patent on one year interval follow-up.  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  Discussed I will see him again in 1 year with duplex.  He needs to continue aspirin  and statin from my standpoint and he is also on Eliquis .  The wife had a lot of questions about Eliquis .  This was not started by me.  Best I can understand in review of the notes he has had multiple lower extremity DVTs that prompted Eliquis  to be started in the past.  Young Hensen, MD [facility]: [phone]

## 2024-03-31 ENCOUNTER — Encounter (HOSPITAL_COMMUNITY): Payer: Self-pay

## 2024-03-31 ENCOUNTER — Inpatient Hospital Stay (HOSPITAL_COMMUNITY)
Admission: EM | Admit: 2024-03-31 | Discharge: 2024-04-02 | DRG: 698 | Disposition: A | Discharge status: Home Health | Attending: Family Medicine | Admitting: Family Medicine

## 2024-03-31 ENCOUNTER — Emergency Department (HOSPITAL_COMMUNITY)

## 2024-03-31 ENCOUNTER — Other Ambulatory Visit: Payer: Self-pay

## 2024-03-31 DIAGNOSIS — T83511A Infection and inflammatory reaction due to indwelling urethral catheter, initial encounter: Secondary | ICD-10-CM | POA: Diagnosis not present

## 2024-03-31 DIAGNOSIS — T83518A Infection and inflammatory reaction due to other urinary catheter, initial encounter: Principal | ICD-10-CM | POA: Diagnosis present

## 2024-03-31 DIAGNOSIS — I69354 Hemiplegia and hemiparesis following cerebral infarction affecting left non-dominant side: Secondary | ICD-10-CM | POA: Diagnosis not present

## 2024-03-31 DIAGNOSIS — Z7984 Long term (current) use of oral hypoglycemic drugs: Secondary | ICD-10-CM | POA: Diagnosis not present

## 2024-03-31 DIAGNOSIS — Z978 Presence of other specified devices: Secondary | ICD-10-CM

## 2024-03-31 DIAGNOSIS — G9341 Metabolic encephalopathy: Secondary | ICD-10-CM | POA: Diagnosis present

## 2024-03-31 DIAGNOSIS — I10 Essential (primary) hypertension: Secondary | ICD-10-CM | POA: Diagnosis present

## 2024-03-31 DIAGNOSIS — Y848 Other medical procedures as the cause of abnormal reaction of the patient, or of later complication, without mention of misadventure at the time of the procedure: Secondary | ICD-10-CM | POA: Diagnosis present

## 2024-03-31 DIAGNOSIS — Z8042 Family history of malignant neoplasm of prostate: Secondary | ICD-10-CM

## 2024-03-31 DIAGNOSIS — Z1612 Extended spectrum beta lactamase (ESBL) resistance: Secondary | ICD-10-CM | POA: Diagnosis present

## 2024-03-31 DIAGNOSIS — Z86718 Personal history of other venous thrombosis and embolism: Secondary | ICD-10-CM | POA: Diagnosis not present

## 2024-03-31 DIAGNOSIS — R531 Weakness: Secondary | ICD-10-CM

## 2024-03-31 DIAGNOSIS — Z87440 Personal history of urinary (tract) infections: Secondary | ICD-10-CM

## 2024-03-31 DIAGNOSIS — K219 Gastro-esophageal reflux disease without esophagitis: Secondary | ICD-10-CM | POA: Diagnosis present

## 2024-03-31 DIAGNOSIS — Z7901 Long term (current) use of anticoagulants: Secondary | ICD-10-CM | POA: Diagnosis not present

## 2024-03-31 DIAGNOSIS — R627 Adult failure to thrive: Secondary | ICD-10-CM

## 2024-03-31 DIAGNOSIS — Z7409 Other reduced mobility: Secondary | ICD-10-CM | POA: Diagnosis not present

## 2024-03-31 DIAGNOSIS — Z820 Family history of epilepsy and other diseases of the nervous system: Secondary | ICD-10-CM

## 2024-03-31 DIAGNOSIS — Z79899 Other long term (current) drug therapy: Secondary | ICD-10-CM

## 2024-03-31 DIAGNOSIS — N400 Enlarged prostate without lower urinary tract symptoms: Secondary | ICD-10-CM | POA: Diagnosis present

## 2024-03-31 DIAGNOSIS — B9620 Unspecified Escherichia coli [E. coli] as the cause of diseases classified elsewhere: Secondary | ICD-10-CM | POA: Diagnosis present

## 2024-03-31 DIAGNOSIS — Z87891 Personal history of nicotine dependence: Secondary | ICD-10-CM | POA: Diagnosis not present

## 2024-03-31 DIAGNOSIS — R1310 Dysphagia, unspecified: Secondary | ICD-10-CM | POA: Diagnosis present

## 2024-03-31 DIAGNOSIS — Y846 Urinary catheterization as the cause of abnormal reaction of the patient, or of later complication, without mention of misadventure at the time of the procedure: Secondary | ICD-10-CM | POA: Diagnosis present

## 2024-03-31 DIAGNOSIS — N390 Urinary tract infection, site not specified: Secondary | ICD-10-CM

## 2024-03-31 DIAGNOSIS — Z833 Family history of diabetes mellitus: Secondary | ICD-10-CM | POA: Diagnosis not present

## 2024-03-31 DIAGNOSIS — Z7982 Long term (current) use of aspirin: Secondary | ICD-10-CM | POA: Diagnosis not present

## 2024-03-31 DIAGNOSIS — G20A1 Parkinson's disease without dyskinesia, without mention of fluctuations: Secondary | ICD-10-CM | POA: Diagnosis present

## 2024-03-31 DIAGNOSIS — Z993 Dependence on wheelchair: Secondary | ICD-10-CM | POA: Diagnosis not present

## 2024-03-31 DIAGNOSIS — Z6828 Body mass index (BMI) 28.0-28.9, adult: Secondary | ICD-10-CM

## 2024-03-31 DIAGNOSIS — B9629 Other Escherichia coli [E. coli] as the cause of diseases classified elsewhere: Secondary | ICD-10-CM

## 2024-03-31 DIAGNOSIS — Z7401 Bed confinement status: Secondary | ICD-10-CM

## 2024-03-31 DIAGNOSIS — E7800 Pure hypercholesterolemia, unspecified: Secondary | ICD-10-CM | POA: Diagnosis present

## 2024-03-31 DIAGNOSIS — E119 Type 2 diabetes mellitus without complications: Secondary | ICD-10-CM | POA: Diagnosis present

## 2024-03-31 DIAGNOSIS — N3000 Acute cystitis without hematuria: Principal | ICD-10-CM

## 2024-03-31 DIAGNOSIS — L89152 Pressure ulcer of sacral region, stage 2: Secondary | ICD-10-CM | POA: Diagnosis present

## 2024-03-31 DIAGNOSIS — L8990 Pressure ulcer of unspecified site, unspecified stage: Secondary | ICD-10-CM | POA: Diagnosis present

## 2024-03-31 DIAGNOSIS — Z801 Family history of malignant neoplasm of trachea, bronchus and lung: Secondary | ICD-10-CM

## 2024-03-31 LAB — URINALYSIS, W/ REFLEX TO CULTURE (INFECTION SUSPECTED)
Bilirubin Urine: NEGATIVE
Glucose, UA: NEGATIVE mg/dL
Ketones, ur: 5 mg/dL — AB
Nitrite: NEGATIVE
Protein, ur: NEGATIVE mg/dL
Specific Gravity, Urine: 1.016 (ref 1.005–1.030)
WBC, UA: >50 WBC/hpf (ref 0–5)
pH: 6 (ref 5.0–8.0)

## 2024-03-31 LAB — CBC WITH DIFFERENTIAL/PLATELET
Abs Immature Granulocytes: 0.01 10*3/uL (ref 0.00–0.07)
Basophils Absolute: 0 10*3/uL (ref 0.0–0.1)
Basophils Relative: 1 %
Eosinophils Absolute: 0.4 10*3/uL (ref 0.0–0.5)
Eosinophils Relative: 6 %
HCT: 43.6 % (ref 39.0–52.0)
Hemoglobin: 14 g/dL (ref 13.0–17.0)
Immature Granulocytes: 0 %
Lymphocytes Relative: 19 %
Lymphs Abs: 1.2 10*3/uL (ref 0.7–4.0)
MCH: 29.9 pg (ref 26.0–34.0)
MCHC: 32.1 g/dL (ref 30.0–36.0)
MCV: 93 fL (ref 80.0–100.0)
Monocytes Absolute: 0.9 10*3/uL (ref 0.1–1.0)
Monocytes Relative: 15 %
Neutro Abs: 3.8 10*3/uL (ref 1.7–7.7)
Neutrophils Relative %: 59 %
Platelets: 271 10*3/uL (ref 150–400)
RBC: 4.69 MIL/uL (ref 4.22–5.81)
RDW: 13.3 % (ref 11.5–15.5)
WBC: 6.3 10*3/uL (ref 4.0–10.5)
nRBC: 0 % (ref 0.0–0.2)

## 2024-03-31 LAB — COMPREHENSIVE METABOLIC PANEL WITH GFR
ALT: 11 U/L (ref 0–44)
AST: 8 U/L — ABNORMAL LOW (ref 15–41)
Albumin: 3.5 g/dL (ref 3.5–5.0)
Alkaline Phosphatase: 73 U/L (ref 38–126)
Anion gap: 9 (ref 5–15)
BUN: 15 mg/dL (ref 8–23)
CO2: 27 mmol/L (ref 22–32)
Calcium: 8.9 mg/dL (ref 8.9–10.3)
Chloride: 102 mmol/L (ref 98–111)
Creatinine, Ser: 1 mg/dL (ref 0.61–1.24)
GFR, Estimated: >60 mL/min (ref >60)
Glucose, Bld: 139 mg/dL — ABNORMAL HIGH (ref 70–99)
Potassium: 3.8 mmol/L (ref 3.5–5.1)
Sodium: 138 mmol/L (ref 135–145)
Total Bilirubin: 1 mg/dL (ref 0.0–1.2)
Total Protein: 7.1 g/dL (ref 6.5–8.1)

## 2024-03-31 LAB — BLOOD GAS, VENOUS
Acid-Base Excess: 7.7 mmol/L — ABNORMAL HIGH (ref 0.0–2.0)
Bicarbonate: 34.2 mmol/L — ABNORMAL HIGH (ref 20.0–28.0)
O2 Saturation: 44.5 %
Patient temperature: 37
pCO2, Ven: 54 mmHg (ref 44–60)
pH, Ven: 7.41 (ref 7.25–7.43)
pO2, Ven: <31 mmHg — CL (ref 32–45)

## 2024-03-31 LAB — AMMONIA: Ammonia: 47 umol/L — ABNORMAL HIGH (ref 9–35)

## 2024-03-31 LAB — I-STAT CG4 LACTIC ACID, ED
Lactic Acid, Venous: 1 mmol/L (ref 0.5–1.9)
Lactic Acid, Venous: 1 mmol/L (ref 0.5–1.9)

## 2024-03-31 LAB — CBG MONITORING, ED: Glucose-Capillary: 138 mg/dL — ABNORMAL HIGH (ref 70–99)

## 2024-03-31 MED ORDER — POLYETHYLENE GLYCOL 3350 17 G PO PACK
17.0000 g | PACK | Freq: Every day | ORAL | Status: DC
  Administered 2024-04-01: 17 g via ORAL
  Filled 2024-03-31 (×2): qty 1

## 2024-03-31 MED ORDER — HYDROCORTISONE ACETATE 25 MG RE SUPP
25.0000 mg | Freq: Every day | RECTAL | Status: DC
  Administered 2024-03-31 – 2024-04-01 (×2): 25 mg via RECTAL
  Filled 2024-03-31 (×3): qty 1

## 2024-03-31 MED ORDER — SODIUM CHLORIDE 0.9 % IV SOLN
1.0000 g | Freq: Three times a day (TID) | INTRAVENOUS | Status: DC
  Administered 2024-04-01 – 2024-04-02 (×5): 1 g via INTRAVENOUS
  Filled 2024-03-31 (×5): qty 20

## 2024-03-31 MED ORDER — BUSPIRONE HCL 5 MG PO TABS
5.0000 mg | ORAL_TABLET | ORAL | Status: DC
  Administered 2024-03-31 – 2024-04-01 (×3): 5 mg via ORAL
  Filled 2024-03-31 (×3): qty 1

## 2024-03-31 MED ORDER — CARBIDOPA-LEVODOPA ER 25-100 MG PO TBCR: 2.0000 | EXTENDED_RELEASE_TABLET | ORAL | Status: DC

## 2024-03-31 MED ORDER — BUSPIRONE HCL 5 MG PO TABS
10.0000 mg | ORAL_TABLET | Freq: Every day | ORAL | Status: DC
  Administered 2024-04-01 – 2024-04-02 (×2): 10 mg via ORAL
  Filled 2024-03-31 (×2): qty 2

## 2024-03-31 MED ORDER — CARBIDOPA-LEVODOPA ER 25-100 MG PO TBCR
2.0000 | EXTENDED_RELEASE_TABLET | Freq: Every day | ORAL | Status: DC
  Administered 2024-03-31 – 2024-04-01 (×2): 2 via ORAL
  Filled 2024-03-31 (×3): qty 2

## 2024-03-31 MED ORDER — MELATONIN 3 MG PO TABS
6.0000 mg | ORAL_TABLET | Freq: Every day | ORAL | Status: DC
  Administered 2024-03-31 – 2024-04-01 (×2): 6 mg via ORAL
  Filled 2024-03-31 (×2): qty 2

## 2024-03-31 MED ORDER — RASAGILINE MESYLATE 1 MG PO TABS
1.0000 mg | ORAL_TABLET | Freq: Every day | ORAL | Status: DC
  Administered 2024-04-01 – 2024-04-02 (×2): 1 mg via ORAL
  Filled 2024-03-31 (×2): qty 1

## 2024-03-31 MED ORDER — CARBIDOPA-LEVODOPA ER 25-100 MG PO TBCR
3.0000 | EXTENDED_RELEASE_TABLET | Freq: Two times a day (BID) | ORAL | Status: DC
  Administered 2024-04-01 – 2024-04-02 (×3): 3 via ORAL
  Filled 2024-03-31 (×5): qty 3

## 2024-03-31 MED ORDER — SODIUM CHLORIDE 0.9 % IV BOLUS
500.0000 mL | Freq: Once | INTRAVENOUS | Status: AC
  Administered 2024-03-31: 500 mL via INTRAVENOUS

## 2024-03-31 MED ORDER — MEMANTINE HCL 10 MG PO TABS
20.0000 mg | ORAL_TABLET | Freq: Every day | ORAL | Status: DC
  Administered 2024-03-31 – 2024-04-01 (×2): 20 mg via ORAL
  Filled 2024-03-31 (×2): qty 2

## 2024-03-31 MED ORDER — ASPIRIN 81 MG PO TBEC
81.0000 mg | DELAYED_RELEASE_TABLET | Freq: Every day | ORAL | Status: DC
  Administered 2024-04-01 – 2024-04-02 (×2): 81 mg via ORAL
  Filled 2024-03-31 (×2): qty 1

## 2024-03-31 MED ORDER — APIXABAN 5 MG PO TABS
5.0000 mg | ORAL_TABLET | Freq: Two times a day (BID) | ORAL | Status: DC
  Administered 2024-03-31 – 2024-04-02 (×4): 5 mg via ORAL
  Filled 2024-03-31 (×4): qty 1

## 2024-03-31 MED ORDER — PIMAVANSERIN TARTRATE 34 MG PO CAPS
34.0000 mg | ORAL_CAPSULE | Freq: Every day | ORAL | Status: DC
  Administered 2024-04-01 – 2024-04-02 (×2): 34 mg via ORAL
  Filled 2024-03-31 (×4): qty 1

## 2024-03-31 MED ORDER — SIMVASTATIN 20 MG PO TABS
20.0000 mg | ORAL_TABLET | Freq: Every day | ORAL | Status: DC
  Administered 2024-04-01: 20 mg via ORAL
  Filled 2024-03-31: qty 1

## 2024-03-31 MED ORDER — BUSPIRONE HCL 10 MG PO TABS: 5.0000 mg | ORAL_TABLET | ORAL | Status: DC

## 2024-03-31 MED ORDER — SODIUM CHLORIDE 0.9 % IV SOLN
1.0000 g | Freq: Once | INTRAVENOUS | Status: AC
  Administered 2024-03-31: 1 g via INTRAVENOUS
  Filled 2024-03-31: qty 20

## 2024-03-31 NOTE — Progress Notes (Signed)
 A consult was received from an ED physician for meropenem  per pharmacy dosing (for an indication other than meningitis). The patient's profile has been reviewed for ht/wt/allergies/indication/available labs. A one time order has been placed for the above antibiotics.  Further antibiotics/pharmacy consults should be ordered by admitting physician if indicated.                       Tera Fellows, PharmD, BCPS [phone] 03/31/2024, 5:06 PM

## 2024-03-31 NOTE — ED Triage Notes (Signed)
 Pt bib EMS from home. Urine culture done and showed UTI. Pharmacy doesn't have medication he needs.Hx L sided weakness from previous stroke.

## 2024-03-31 NOTE — ED Provider Notes (Signed)
 [place] EMERGENCY DEPARTMENT AT [facility] Provider Note   CSN: [id number] Arrival date & time: 03/31/24  1423     History  Chief Complaint  Patient presents with   Urinary Tract Infection    Robert [name] is a 78 y.o. male.   Urinary Tract Infection    Patient has a history of hypertension acid reflux hyperlipidemia urethral stricture DVT, stroke, UTI.  Patient brought into the ED for evaluation of increased fatigue and lethargy.  Wife concerned the patient may have a recurrent UTI.  Patient was at the [place] recently and was told that he had a urine infection that was resistant to all antibiotics and he needed IV antibiotics.  Patient has not reported any fevers.  No abdominal pain.  No vomiting or diarrhea.  Patient does have an indwelling catheter.  Patient denies any specific complaints himself.  He states he feels fine.  Notes reviewed from the patient's recent visit at the [place].  The urine culture report showed Greater than 75000 mixed flora.  Likely contaminated suggestive of colonization Home Medications Prior to Admission medications   Medication Sig Start Date End Date Taking? Authorizing Provider  acetaminophen  (TYLENOL ) 500 MG tablet Take 1,000 mg by mouth every 6 (six) hours as needed for mild pain (pain score 1-3), moderate pain (pain score 4-6) or fever.    [provider]  apixaban  (ELIQUIS ) 5 MG TABS tablet Take 1 tablet (5 mg total) by mouth 2 (two) times daily. 05/27/21   Love, Renay Carota, PA-C  ascorbic acid (VITAMIN C) 500 MG tablet TAKE ONE TABLET BY MOUTH DAILY FOR PREVENTION OF UTI 02/17/22   [provider]  aspirin  EC 81 MG tablet Take 81 mg by mouth daily. Swallow whole.    [provider]  bacitracin 500 UNIT/GM ointment Apply 1 Application topically daily as needed for wound care.    [provider]  busPIRone  (BUSPAR ) 10 MG tablet Take 5-10 mg by mouth See admin instructions. Take 10 mg by mouth in the morning.  Take 5 mg by mouth in the afternoon and at bedtime.    [provider]  Carbidopa -Levodopa  ER (SINEMET  CR) 25-100 MG tablet controlled release 2-3 tablets See admin instructions. Take 3 tablets by mouth in the morning and afternoon. Take 2 tablets in the evening. 12/18/21   [provider]  cephALEXin  (KEFLEX ) 250 MG capsule Take 250 mg by mouth at bedtime. Patient not taking: Reported on 03/21/2024    [provider]  Cholecalciferol 50 MCG (2000 UT) TABS Take 2,000 Units by mouth daily. 12/23/21   [provider]  Dupilumab (DUPIXENT) 300 MG/2ML SOAJ Inject into the skin.    [provider]  Dupilumab 300 MG/2ML SOAJ Inject 600 mg into the skin.    [provider]  HYDROCORTISONE  ACE, RECTAL, 30 MG SUPP Place 30 mg rectally at bedtime.    [provider]  melatonin 3 MG TABS tablet Take 6 mg by mouth at bedtime.    [provider]  memantine  (NAMENDA ) 10 MG tablet Take 20 mg by mouth at bedtime.    [provider]  metFORMIN  (GLUCOPHAGE ) 500 MG tablet Take 500 mg by mouth at bedtime.    [provider]  miconazole (MICOTIN) 2 % powder Apply 1 Application topically daily.    [provider]  nystatin-triamcinolone ointment (MYCOLOG) Apply 1 application  topically as needed (rash/yeast). 10/26/21   [provider]  omeprazole (PRILOSEC) 20 MG capsule Take  20 mg by mouth daily.    [provider]  oxybutynin  (DITROPAN ) 5 MG tablet Take 5 mg by mouth in the morning and at bedtime.    [provider]  Pimavanserin  Tartrate (NUPLAZID ) 34 MG CAPS Take 34 mg by mouth daily.    [provider]  polyethylene glycol (MIRALAX  / GLYCOLAX ) 17 g packet Take 17 g by mouth daily. 05/12/17   [provider]  rasagiline  (AZILECT ) 1 MG TABS tablet Take 1 mg by mouth daily.    [provider]  senna-docusate (SENOKOT-S) 8.6-50 MG tablet Take 2 tablets by mouth daily as  needed for mild constipation or moderate constipation. Patient not taking: Reported on 01/22/2024    [provider]  simvastatin  (ZOCOR ) 40 MG tablet Take 20 mg by mouth at bedtime. 09/05/15   [provider]  tacrolimus (PROTOPIC) 0.1 % ointment Apply 1 application. topically See admin instructions. Apply to groin, armpit, and bottom when skin is broken out twice a day as needed 11/06/21   [provider]  triamcinolone cream (KENALOG) 0.1 % Apply 1 Application topically daily as needed (rash).    [provider]  zinc  oxide 20 % ointment Apply 1 Application topically See admin instructions. Every time there is a diaper change 01/27/24   Leona Rake, MD      Allergies    Fluconazole, Propofol , Lisinopril, Sulfamethoxazole -trimethoprim , and Fluoxetine    Review of Systems   Review of Systems  Physical Exam Updated Vital Signs BP 135/75   Pulse 93   Temp 98.6 F (37 C) (Oral)   Resp 14   Ht 1.88 m (6\' 2" )   Wt 98.9 kg   SpO2 99%   BMI 27.99 kg/m  Physical Exam Vitals and nursing note reviewed.  Constitutional:      General: He is not in acute distress.    Appearance: He is well-developed.  HENT:     Head: Normocephalic and atraumatic.     Right Ear: External ear normal.     Left Ear: External ear normal.  Eyes:     General: No scleral icterus.       Right eye: No discharge.        Left eye: No discharge.     Conjunctiva/sclera: Conjunctivae normal.  Neck:     Trachea: No tracheal deviation.  Cardiovascular:     Rate and Rhythm: Normal rate and regular rhythm.  Pulmonary:     Effort: Pulmonary effort is normal. No respiratory distress.     Breath sounds: Normal breath sounds. No stridor. No wheezing or rales.  Abdominal:     General: Bowel sounds are normal. There is no distension.     Palpations: Abdomen is soft.     Tenderness: There is no abdominal tenderness. There is no guarding or rebound.  Musculoskeletal:        General:  No tenderness or deformity.     Cervical back: Neck supple.  Skin:    General: Skin is warm and dry.     Findings: No rash.  Neurological:     Mental Status: He is alert. Mental status is at baseline.     Cranial Nerves: No cranial nerve deficit, dysarthria or facial asymmetry.     Sensory: No sensory deficit.     Motor: No abnormal muscle tone or seizure activity.     Coordination: Coordination normal.     Comments: Weakness left upper extremity and left lower extremity, upper extremity more affected than  the lower extremity, left upper extremity contracted, patient has a brace on his left lower extremity for foot drop (per wife this is baseline since his prior stroke) No weakness right upper extremity, no facial droop no aphasia  Psychiatric:        Mood and Affect: Mood normal.     ED Results / Procedures / Treatments   Labs (all labs ordered are listed, but only abnormal results are displayed) Labs Reviewed - No data to display  EKG None  Radiology No results found.  Procedures Procedures    Medications Ordered in ED Medications - No data to display  ED Course/ Medical Decision Making/ A&P Clinical Course as of 03/31/24 1648  Fri Mar 31, 2024  1627 Comprehensive metabolic panel(!) Metabolic panel normal.  CBC normal.  Venous blood gas without acute abnormalities. [JK]  1628 Chest x-ray without acute findings [JK]    Clinical Course User Index [JK] Trish Furl, MD                                 Medical Decision Making Amount and/or Complexity of Data Reviewed Labs: ordered. Decision-making details documented in ED Course. Radiology: ordered.   Patient presented to the ED for evaluation of increasing weakness.  Family's had multiple recurrent urinary tract infections.  Patient CBC does not show any signs of anemia or significant leukocytosis.  Metabolic panel does not show any acute abnormality.  Venous blood gas without signs of acidosis.  Ammonia is  currently pending.  Urinalysis is abnormal.  Possible infection versus colonization.  With his increasing weakness CT scan ordered, currently pending at shift change.  Anticipate need for admission further workup.  Care turned over to Dr Delana Favors        Final Clinical Impression(s) / ED Diagnoses Final diagnoses:  None    Rx / DC Orders ED Discharge Orders     None         Trish Furl, MD 03/31/24 1652

## 2024-03-31 NOTE — ED Provider Notes (Signed)
  Physical Exam  BP (!) 157/79   Pulse 82   Temp 98.5 F (36.9 C) (Oral)   Resp 12   Ht 6\' 2"  (1.88 m)   Wt 98.9 kg   SpO2 99%   BMI 27.99 kg/m   Physical Exam  Procedures  Procedures  ED Course / MDM   Clinical Course as of 03/31/24 1825  Fri Mar 31, 2024  1627 Comprehensive metabolic panel(!) Metabolic panel normal.  CBC normal.  Venous blood gas without acute abnormalities. [JK]  1628 Chest x-ray without acute findings [JK]    Clinical Course User Index [JK] Trish Furl, MD   Medical Decision Making Care assumed at 5 PM.  Patient is here with weakness and altered mental status.  Patient is accompanied by his wife who states that this happens when he has a urinary tract infection.  Previously he has ESBL in his urine that requires meropenem .  Signout pending urinalysis result as well as CT head  6:26 PM I reviewed patient's labs and white blood cell count is normal and lactate is normal.  Patient CT head does show previous stroke but no bleeding.  Patient received meropenem .  Due to worsening weakness, patient will be admitted for UTI  Problems Addressed: Acute cystitis without hematuria: acute illness or injury Weakness: acute illness or injury  Amount and/or Complexity of Data Reviewed Labs: ordered. Decision-making details documented in ED Course. Radiology: ordered and independent interpretation performed. Decision-making details documented in ED Course.  Risk Decision regarding hospitalization.          Dalene Duck, MD 03/31/24 [id number]

## 2024-03-31 NOTE — H&P (Addendum)
 History and Physical    Robert [name] GUY:[id number] DOB: April 16, 1946 DOA: 03/31/2024  PCP: Robert Rounds, NP Patient coming from:   I have personally briefly reviewed patient's old medical records in [facility]  Chief Complaint:  confusion and weakness  HPI: Robert [name] is a 78 y.o. male with medical history significant of failure to thrive. Bed to wheelchair bound from home , with h/o Parkinson's ,CVA with left hemiparesis, DVT status post IVC , on eliquis , chronic indwelling urinary catheter and recurrent ESBL UTI,  presents to the ED due to confusion and weakness, malodorous urine , Foley catheter was last changed on May 27 , wife reports his urine continue to be malodorous after Foley exchange and he gets confused and weaker every time he gets UTI.  No cough, no fever, no diarrhea,   ED Course:   Data reviewed:  Blood pressure 138/78, pulse 80, temperature 98.5 F (36.9 C), temperature source Oral, resp. rate 19, height 6\' 2"  (1.88 m), weight 98.9 kg, SpO2 98%.  Meropenemx1 in the ED     Review of Systems: As per HPI otherwise all other systems reviewed and are negative.   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   Diabetes mellitus without complication (HCC)    Diverticulosis    Frequency of urination    GERD (gastroesophageal reflux disease)    Gross hematuria    History of acute pyelonephritis    10-13-2012   History of CVA with residual deficit    2008--  left side of body weakness and foot drop (wears leg brace and uses cane)   History of DVT of lower extremity    2008--  cva   Hypercholesteremia    Hyperlipidemia    Hyperlipidemia    Hypertension    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 2008   Left leg DVT (HCC)    Neuromuscular disorder (HCC)    Parkinsons   S/P insertion of IVC (inferior vena  caval) filter    2008   Urethral stricture    Urgency of urination    Urinary retention    Weakness of left side of body    secondary to cva 2008   Wears glasses    Wears hearing aid    bilateral-- wears intermittantly    Past Surgical History:  Procedure Laterality Date   CYSTOSCOPY WITH RETROGRADE URETHROGRAM N/A 10/23/2015   Procedure: CYSTOSCOPY WITH RETROGRADE URETHROGRAM;  Surgeon: Ann Barnacle, MD;  Location: [facility];  Service: Urology;  Laterality: N/A;   CYSTOSCOPY WITH URETHRAL DILATATION N/A 10/23/2015   Procedure: CYSTOSCOPY WITH URETHRAL BALLOON DILATATION;  Surgeon: Ann Barnacle, MD;  Location: [facility];  Service: Urology;  Laterality: N/A;  BALLOON DILATION    IVC FILTER PLACEMENT ([facility] HX)  2008   LAPAROSCOPIC CHOLECYSTECTOMY SINGLE PORT N/A 01/09/2021   Procedure: LAPAROSCOPIC CHOLECYSTECTOMY WITH IOC;  Surgeon: Candyce Champagne, MD;  Location: [facility];  Service: General;  Laterality: N/A;  90 MIN   TRANSCAROTID ARTERY REVASCULARIZATION  Left 05/12/2021   Procedure: LEFT TRANSCAROTID ARTERY REVASCULARIZATION;  Surgeon: Young Hensen, MD;  Location: [facility];  Service: Vascular;  Laterality: Left;    Social History  reports that he quit smoking about 53 years ago. His smoking use included cigarettes. He started smoking about  63 years ago. He has never used smokeless tobacco. He reports that he does not drink alcohol and does not use drugs.  Allergies  Allergen Reactions   Fluconazole Rash   Propofol  Other (See Comments)    "hiccups for weeks"  Other reaction(s): Hiccups   Lisinopril Cough   Sulfamethoxazole -Trimethoprim  Diarrhea   Fluoxetine Nausea Only and Other (See Comments)    Hallucinations, Feeling nervous, Nausea, Tachycardia    Family History  Problem Relation Age of Onset   Diabetes Sister    Lung cancer Brother        Post 9/11 voluntary work in [employer]   Prostate cancer Brother    Other Mother        passed  away young- non medical   Alzheimer's disease Father    Healthy Son     Prior to Admission medications   Medication Sig Start Date End Date Taking? Authorizing Provider  acetaminophen  (TYLENOL ) 500 MG tablet Take 1,000 mg by mouth every 6 (six) hours as needed for mild pain (pain score 1-3), moderate pain (pain score 4-6) or fever.    [provider]  apixaban  (ELIQUIS ) 5 MG TABS tablet Take 1 tablet (5 mg total) by mouth 2 (two) times daily. 05/27/21   Love, Renay Carota, PA-C  ascorbic acid (VITAMIN C) 500 MG tablet TAKE ONE TABLET BY MOUTH DAILY FOR PREVENTION OF UTI 02/17/22   [provider]  aspirin  EC 81 MG tablet Take 81 mg by mouth daily. Swallow whole.    [provider]  bacitracin 500 UNIT/GM ointment Apply 1 Application topically daily as needed for wound care.    [provider]  busPIRone  (BUSPAR ) 10 MG tablet Take 5-10 mg by mouth See admin instructions. Take 10 mg by mouth in the morning. Take 5 mg by mouth in the afternoon and at bedtime.    [provider]  Carbidopa -Levodopa  ER (SINEMET  CR) 25-100 MG tablet controlled release 2-3 tablets See admin instructions. Take 3 tablets by mouth in the morning and afternoon. Take 2 tablets in the evening. 12/18/21   [provider]  cephALEXin  (KEFLEX ) 250 MG capsule Take 250 mg by mouth at bedtime. Patient not taking: Reported on 03/21/2024    [provider]  Cholecalciferol 50 MCG (2000 UT) TABS Take 2,000 Units by mouth daily. 12/23/21   [provider]  Dupilumab (DUPIXENT) 300 MG/2ML SOAJ Inject into the skin.    [provider]  Dupilumab 300 MG/2ML SOAJ Inject 600 mg into the skin.    [provider]  HYDROCORTISONE  ACE, RECTAL, 30 MG SUPP Place 30 mg rectally at bedtime.    [provider]  melatonin 3 MG TABS tablet Take 6 mg by mouth at bedtime.    [provider]  memantine  (NAMENDA ) 10 MG tablet Take 20 mg by mouth at bedtime.     [provider]  metFORMIN  (GLUCOPHAGE ) 500 MG tablet Take 500 mg by mouth at bedtime.    [provider]  miconazole (MICOTIN) 2 % powder Apply 1 Application topically daily.    [provider]  nystatin-triamcinolone ointment (MYCOLOG) Apply 1 application  topically as needed (rash/yeast). 10/26/21   [provider]  omeprazole (PRILOSEC) 20 MG capsule Take 20 mg by mouth daily.    [provider]  oxybutynin  (DITROPAN ) 5 MG tablet Take 5 mg by mouth in the morning and at bedtime.    [provider]  Pimavanserin  Tartrate (NUPLAZID ) 34 MG CAPS Take 34  mg by mouth daily.    [provider]  polyethylene glycol (MIRALAX  / GLYCOLAX ) 17 g packet Take 17 g by mouth daily. 05/12/17   [provider]  rasagiline  (AZILECT ) 1 MG TABS tablet Take 1 mg by mouth daily.    [provider]  senna-docusate (SENOKOT-S) 8.6-50 MG tablet Take 2 tablets by mouth daily as needed for mild constipation or moderate constipation. Patient not taking: Reported on 01/22/2024    [provider]  simvastatin  (ZOCOR ) 40 MG tablet Take 20 mg by mouth at bedtime. 09/05/15   [provider]  tacrolimus (PROTOPIC) 0.1 % ointment Apply 1 application. topically See admin instructions. Apply to groin, armpit, and bottom when skin is broken out twice a day as needed 11/06/21   [provider]  triamcinolone cream (KENALOG) 0.1 % Apply 1 Application topically daily as needed (rash).    [provider]  zinc  oxide 20 % ointment Apply 1 Application topically See admin instructions. Every time there is a diaper change 01/27/24   Leona Rake, MD    Physical Exam: Vitals:   03/31/24 1545 03/31/24 1700 03/31/24 1821 03/31/24 1830  BP: (!) 145/80 (!) 157/79 (!) 145/83 138/78  Pulse: 72 (!) 133 82 80  Resp: 19 17 12 19   Temp:   98.5 F (36.9 C)   TempSrc:   Oral   SpO2: 100% (!) 87% 99% 98%  Weight:      Height:         Constitutional: Chronically ill-appearing, lethargic Eyes: PERRL, lids and conjunctivae normal Respiratory: clear to auscultation bilaterally, no wheezing, no crackles. Normal respiratory effort. No accessory muscle use.  Cardiovascular: Regular rate and rhythm,  No extremity edema. 2+ pedal pulses. No carotid bruits.  Abdomen: no tenderness, not distended, Bowel sounds positive.  Musculoskeletal: Left hemiparesis , left upper extremity contracture   Skin: Sacral decubitus ulcer ,  Neurologic: oriented to person , Know it is June, 2025, does not know the date, he knows he is in the hospital, not able to name it Psychiatric:  no agitation     Labs on Admission: I have personally reviewed following labs and imaging studies  CBC: Recent Labs  Lab 03/31/24 1545  WBC 6.3  NEUTROABS 3.8  HGB 14.0  HCT 43.6  MCV 93.0  PLT 271    Basic Metabolic Panel: Recent Labs  Lab 03/31/24 1545  NA 138  K 3.8  CL 102  CO2 27  GLUCOSE 139*  BUN 15  CREATININE 1.00  CALCIUM 8.9    GFR: Estimated Creatinine Clearance: 79 mL/min (by C-G formula based on SCr of 1 mg/dL).  Liver Function Tests: Recent Labs  Lab 03/31/24 1545  AST 8*  ALT 11  ALKPHOS 73  BILITOT 1.0  PROT 7.1  ALBUMIN 3.5    Urine analysis:    Component Value Date/Time   COLORURINE YELLOW 03/31/2024 1509   APPEARANCEUR HAZY (A) 03/31/2024 1509   LABSPEC 1.016 03/31/2024 1509   PHURINE 6.0 03/31/2024 1509   GLUCOSEU NEGATIVE 03/31/2024 1509   GLUCOSEU 100 (A) 02/06/2019 0813   HGBUR SMALL (A) 03/31/2024 1509   BILIRUBINUR NEGATIVE 03/31/2024 1509   KETONESUR 5 (A) 03/31/2024 1509   PROTEINUR NEGATIVE 03/31/2024 1509   UROBILINOGEN 1.0 02/06/2019 0813   NITRITE NEGATIVE 03/31/2024 1509   LEUKOCYTESUR LARGE (A) 03/31/2024 1509    Radiological Exams on Admission: CT Head Wo Contrast Result Date: 03/31/2024 CLINICAL DATA:  Mental status change, unknown cause EXAM: CT HEAD  WITHOUT CONTRAST TECHNIQUE:  Contiguous axial images were obtained from the base of the skull through the vertex without intravenous contrast. RADIATION DOSE REDUCTION: This exam was performed according to the departmental dose-optimization program which includes automated exposure control, adjustment of the mA and/or kV according to patient size and/or use of iterative reconstruction technique. COMPARISON:  05/12/2021 FINDINGS: Brain: Large area of encephalomalacia involving the right frontal lobe is stable since prior study. There is atrophy and chronic small vessel disease changes. No acute intracranial abnormality. Specifically, no hemorrhage, hydrocephalus, mass lesion, acute infarction, or significant intracranial injury. Vascular: No hyperdense vessel or unexpected calcification. Skull: No acute calvarial abnormality. Sinuses/Orbits: No acute findings Other: None IMPRESSION: Atrophy, chronic microvascular disease. No acute intracranial abnormality. Stable large area of encephalomalacia in the right frontal lobe. Electronically Signed   By: Janeece Mechanic M.D.   On: 03/31/2024 17:20   DG Chest 1 View Result Date: 03/31/2024 CLINICAL DATA:  Urinary tract infection. EXAM: CHEST  1 VIEW COMPARISON:  01/22/2024 FINDINGS: The heart remains near the upper limit of normal in size. Tortuous and partially calcified thoracic aorta. Stable linear scarring at the left lung base. Otherwise, clear lungs. Stable prominent upper lung zone pulmonary vasculature. No pleural fluid. Bilateral shoulder degenerative changes with superior migration of the humeral heads is again demonstrated. IMPRESSION: 1. No acute abnormality. 2. Stable borderline cardiomegaly and mild pulmonary vascular congestion. 3. Stable bilateral shoulder degenerative changes with evidence of chronic bilateral rotator cuff tears. Electronically Signed   By: Catherin Closs M.D.   On: 03/31/2024 15:34    EKG: Independently reviewed.   Assessment/Plan   Acute metabolic  encephalopathy Generalized weakness Recurrent ESBI UTI Order to exchange foley, continue meropenem , f/u on urine  ( urine culture obtained prior to foley exchange) and blood culture obtained in the ED    H/o DVT, continue eliquis   H/o CVA with left hemiparesis, continue home meds Wife states he does ok with soft diet but think he might need to have speech to see him, speech eval ordered, aspiration precaution ordered   Failure to thrive Bed to wheelchair-bound Sacral decubitus ulcer Wound care ordered     DVT prophylaxis: On Eliquis    Code Status:   Full code, verified with wife at bedside     Consults called:  none Admission status:  Inpatient   Voice Recognition /Dragon dictation system was used to create this note, attempts have been made to correct errors. Please contact the author with questions and/or clarifications.  Lavaughn Portland MD PhD FACP [facility]  How to contact the [facility] Attending or Consulting provider 7A - 7P or covering provider during after hours 7P -7A, for this patient?   Check the care team in [facility] and look for a) attending/consulting TR[facility] provider listed and b) the TRH team listed Log into [email or web address] and use [facility]'s universal password to access. If you do not have the password, please contact the hospital operator. Locate the TR[facility] provider you are looking for under [facility] and page to a number that you can be directly reached. If you still have difficulty reaching the provider, please page the [facility] (Director on Call) for the Hospitalists listed on amion for assistance.  03/31/2024, 6:42 PM     Author: Lavaughn Portland, MD 03/31/2024 6:42 PM  For on call review [email or web address].

## 2024-04-01 DIAGNOSIS — T83511A Infection and inflammatory reaction due to indwelling urethral catheter, initial encounter: Secondary | ICD-10-CM

## 2024-04-01 LAB — COMPREHENSIVE METABOLIC PANEL WITH GFR
ALT: 7 U/L (ref 0–44)
AST: 6 U/L — ABNORMAL LOW (ref 15–41)
Albumin: 3 g/dL — ABNORMAL LOW (ref 3.5–5.0)
Alkaline Phosphatase: 62 U/L (ref 38–126)
Anion gap: 7 (ref 5–15)
BUN: 13 mg/dL (ref 8–23)
CO2: 25 mmol/L (ref 22–32)
Calcium: 8.9 mg/dL (ref 8.9–10.3)
Chloride: 107 mmol/L (ref 98–111)
Creatinine, Ser: 0.84 mg/dL (ref 0.61–1.24)
GFR, Estimated: >60 mL/min (ref >60)
Glucose, Bld: 133 mg/dL — ABNORMAL HIGH (ref 70–99)
Potassium: 3.8 mmol/L (ref 3.5–5.1)
Sodium: 139 mmol/L (ref 135–145)
Total Bilirubin: 0.9 mg/dL (ref 0.0–1.2)
Total Protein: 6 g/dL — ABNORMAL LOW (ref 6.5–8.1)

## 2024-04-01 LAB — CBC
HCT: 43.4 % (ref 39.0–52.0)
Hemoglobin: 13.5 g/dL (ref 13.0–17.0)
MCH: 29.8 pg (ref 26.0–34.0)
MCHC: 31.1 g/dL (ref 30.0–36.0)
MCV: 95.8 fL (ref 80.0–100.0)
Platelets: 251 10*3/uL (ref 150–400)
RBC: 4.53 MIL/uL (ref 4.22–5.81)
RDW: 13.4 % (ref 11.5–15.5)
WBC: 6.8 10*3/uL (ref 4.0–10.5)
nRBC: 0 % (ref 0.0–0.2)

## 2024-04-01 MED ORDER — CHLORHEXIDINE GLUCONATE CLOTH 2 % EX PADS
6.0000 | MEDICATED_PAD | Freq: Every day | CUTANEOUS | Status: DC
  Administered 2024-04-01 – 2024-04-02 (×2): 6 via TOPICAL

## 2024-04-01 MED ORDER — SALINE SPRAY 0.65 % NA SOLN
1.0000 | NASAL | Status: DC | PRN
  Filled 2024-04-01: qty 44

## 2024-04-01 MED ORDER — ZINC OXIDE 40 % EX OINT
TOPICAL_OINTMENT | Freq: Two times a day (BID) | CUTANEOUS | Status: DC
  Administered 2024-04-01: 1 via TOPICAL
  Filled 2024-04-01: qty 57

## 2024-04-01 MED ORDER — ACETAMINOPHEN 325 MG PO TABS
650.0000 mg | ORAL_TABLET | Freq: Four times a day (QID) | ORAL | Status: DC | PRN
  Administered 2024-04-01: 650 mg via ORAL
  Filled 2024-04-01 (×2): qty 2

## 2024-04-01 NOTE — Progress Notes (Signed)
 PROGRESS NOTE    Robert [name]  ZOX:[id number] DOB: 11/12/1945 DOA: 03/31/2024 PCP: Lou Rounds, NP   Brief Narrative: Robert [name] is a 78 y.o. 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  Patient presented secondary to confusion and weakness and found to have evidence of recurrent UTI.  Patient start empirically on antibiotics.  Blood and urine cultures obtained and are pending.   Assessment and Plan:  Acute metabolic encephalopathy Appears to be secondary to acute infection secondary to UTI.  Per wife, mental status is significant improved from admission.  Catheter associated UTI Present on admission.  Urine culture obtained on admission prior to Foley catheter exchange.  Patient start empirically on meropenem  secondary to history of recurrent ESBL UTI.  Blood cultures also obtained on admission. - Continue meropenem  - Follow-up urine culture - Follow-up blood cultures  History of DVT - Continue Eliquis   History of CVA Left-sided hemiparesis Patient is on Eliquis  and aspirin  as an outpatient. - Continue Eliquis  and aspirin   Parkinson disease Patient is on rasagiline , Sinemet , Namenda , BuSpar  as an outpatient.  Patient is bed to wheelchair bound. - Continue rasagiline , Sinemet , Namenda , BuSpar  - PT/OT  Pressure injury Left/right buttocks.  Present on admission.  Wound care consulted.    DVT prophylaxis: Eliquis  Code Status:   Code Status: Full Code Family Communication: Wife at bedside Disposition Plan: Discharge home likely in 1 to 2 days pending culture data and transition to outpatient antibiotic regimen   Consultants:  None  Procedures:  Foley catheter exchange on 6/6  Antimicrobials: Meropenem    Subjective: Patient without issues this morning.  Wife noted that he had some mild hemorrhage around his urethral catheter  emanating from urethral meatus.  Objective: BP 129/67 (BP Location: Left Arm)   Pulse 83   Temp 99.3 F (37.4 C) (Oral)   Resp 16   Ht 6\' 2"  (1.88 m)   Wt 99.4 kg   SpO2 100%   BMI 28.14 kg/m   Examination:  General exam: Appears calm and comfortable Respiratory system: Clear to auscultation. Respiratory effort normal. Cardiovascular system: S1 & S2 heard, RRR. Gastrointestinal system: Abdomen is nondistended, soft and nontender. Normal bowel sounds heard. Central nervous system: Alert and oriented to person and place. Musculoskeletal: No calf tenderness   Data Reviewed: I have personally reviewed following labs and imaging studies  CBC Lab Results  Component Value Date   WBC 6.8 04/01/2024   RBC 4.53 04/01/2024   HGB 13.5 04/01/2024   HCT 43.4 04/01/2024   MCV 95.8 04/01/2024   MCH 29.8 04/01/2024   PLT 251 04/01/2024   MCHC 31.1 04/01/2024   RDW 13.4 04/01/2024   LYMPHSABS 1.2 03/31/2024   MONOABS 0.9 03/31/2024   EOSABS 0.4 03/31/2024   BASOSABS 0.0 03/31/2024     Last metabolic panel Lab Results  Component Value Date   NA 139 04/01/2024   K 3.8 04/01/2024   CL 107 04/01/2024   CO2 25 04/01/2024   BUN 13 04/01/2024   CREATININE 0.84 04/01/2024   GLUCOSE 133 (H) 04/01/2024   GFRNONAA >60 04/01/2024   GFRAA >60 09/28/2019   CALCIUM 8.9 04/01/2024   PHOS 3.1 02/11/2022   PROT 6.0 (L) 04/01/2024   ALBUMIN 3.0 (L) 04/01/2024   BILITOT 0.9 04/01/2024   ALKPHOS 62 04/01/2024   AST 6 (L) 04/01/2024   ALT 7 04/01/2024   ANIONGAP 7 04/01/2024  GFR: Estimated Creatinine Clearance: 94.3 mL/min (by C-G formula based on SCr of 0.84 mg/dL).  Recent Results (from the past 240 hours)  Urine Culture     Status: Abnormal (Preliminary result)   Collection Time: 03/31/24  3:09 PM   Specimen: Urine, Random  Result Value Ref Range Status   Specimen Description   Final    URINE, RANDOM Performed at [facility], [address],  [place], [place] [address]    Special Requests   Final    NONE Reflexed from [id number] Performed at [facility], [address], [place], [place] [address]    Culture (A)  Final    >=100,000 COLONIES/mL ESCHERICHIA COLI SUSCEPTIBILITIES TO FOLLOW Performed at [facility], [address] [place], [place] [address]    Report Status PENDING  Incomplete  Blood culture (routine x 2)     Status: None (Preliminary result)   Collection Time: 03/31/24  4:45 PM   Specimen: BLOOD  Result Value Ref Range Status   Specimen Description   Final    BLOOD LEFT ANTECUBITAL Performed at [facility], [address] [place], [place] [address]    Special Requests   Final    BOTTLES DRAWN AEROBIC AND ANAEROBIC Blood Culture results may not be optimal due to an inadequate volume of blood received in culture bottles Performed at [facility], [address] [place], [place] [address]    Culture   Final    NO GROWTH < 24 HOURS Performed at [facility], [address] [place], [place] [address]    Report Status PENDING  Incomplete  Blood culture (routine x 2)     Status: None (Preliminary result)   Collection Time: 03/31/24  4:55 PM   Specimen: BLOOD  Result Value Ref Range Status   Specimen Description   Final    BLOOD RIGHT ANTECUBITAL Performed at [facility], [address] [place], [place] [address]    Special Requests   Final    BOTTLES DRAWN AEROBIC AND ANAEROBIC Blood Culture results may not be optimal due to an inadequate volume of blood received in culture bottles Performed at [facility], [address], [place], [place] [address]    Culture   Final    NO GROWTH < 24 HOURS Performed at [facility], [address], [place], [place] [address]    Report Status PENDING  Incomplete      Radiology Studies: CT Head Wo Contrast Result Date: 03/31/2024 CLINICAL DATA:  Mental  status change, unknown cause EXAM: CT HEAD WITHOUT CONTRAST TECHNIQUE: Contiguous axial images were obtained from the base of the skull through the vertex without intravenous contrast. RADIATION DOSE REDUCTION: This exam was performed according to the departmental dose-optimization program which includes automated exposure control, adjustment of the mA and/or kV according to patient size and/or use of iterative reconstruction technique. COMPARISON:  05/12/2021 FINDINGS: Brain: Large area of encephalomalacia involving the right frontal lobe is stable since prior study. There is atrophy and chronic small vessel disease changes. No acute intracranial abnormality. Specifically, no hemorrhage, hydrocephalus, mass lesion, acute infarction, or significant intracranial injury. Vascular: No hyperdense vessel or unexpected calcification. Skull: No acute calvarial abnormality. Sinuses/Orbits: No acute findings Other: None IMPRESSION: Atrophy, chronic microvascular disease. No acute intracranial abnormality. Stable large area of encephalomalacia in the right frontal lobe. Electronically Signed   By: Janeece Mechanic M.D.   On:  03/31/2024 17:20   DG Chest 1 View Result Date: 03/31/2024 CLINICAL DATA:  Urinary tract infection. EXAM: CHEST  1 VIEW COMPARISON:  01/22/2024 FINDINGS: The heart remains near the upper limit of normal in size. Tortuous and partially calcified thoracic aorta. Stable linear scarring at the left lung base. Otherwise, clear lungs. Stable prominent upper lung zone pulmonary vasculature. No pleural fluid. Bilateral shoulder degenerative changes with superior migration of the humeral heads is again demonstrated. IMPRESSION: 1. No acute abnormality. 2. Stable borderline cardiomegaly and mild pulmonary vascular congestion. 3. Stable bilateral shoulder degenerative changes with evidence of chronic bilateral rotator cuff tears. Electronically Signed   By: Catherin Closs M.D.   On: 03/31/2024 15:34      LOS: 1 day     Aneita Keens, MD [facility] 04/01/2024, 12:15 PM   If 7PM-7AM, please contact night-coverage [email or web address]

## 2024-04-01 NOTE — Consult Note (Addendum)
 WOC Nurse Consult Note: patient familiar to WOC team from previous admission with Stage 2 pressure injury buttocks  Reason for Consult: Sacral wound  Wound type: Stage 2 Pressure Injury Buttocks  Pressure Injury POA: Yes Measurement: see nursing flowsheet  Wound bed: pink moist  Drainage (amount, consistency, odor) see nursing flowsheet  Periwound: some moisture associated skin damage  Dressing procedure/placement/frequency: Cleanse sacrum/buttocks with soap and water, dry and apply a thin layer of Desitin to area 2 times daily and prn soiling.  Cover with silicone foam or ABD pad and tape whichever is preferred.   POC discussed with bedside nurse. Appreciate H. Donna Fus, RN assistance with this consult.   WOC team will not follow. Re-consult if further needs arise.   Thank you,    Ronni Colace MSN, RN-BC, [employer] [phone]

## 2024-04-01 NOTE — Evaluation (Signed)
 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   Diabetes mellitus without complication (HCC)    Diverticulosis    Frequency of urination    GERD (gastroesophageal reflux disease)    Gross hematuria    History of acute pyelonephritis    10-13-2012   History of CVA with residual deficit    2008--  left side of body weakness and foot drop (wears leg brace and uses cane)   History of DVT of lower extremity    2008--  cva   Hypercholesteremia    Hyperlipidemia    Hyperlipidemia    Hypertension    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 2008   Left leg DVT (HCC)    Neuromuscular disorder (HCC)    Parkinsons   S/P insertion of IVC (inferior vena caval) filter    2008   Urethral stricture    Urgency of urination    Urinary retention    Weakness of left side of body    secondary to cva 2008   Wears glasses    Wears hearing aid    bilateral-- wears intermittantly   Past Surgical History:  Past Surgical History:  Procedure Laterality Date   CYSTOSCOPY WITH RETROGRADE URETHROGRAM N/A 10/23/2015   Procedure: CYSTOSCOPY WITH RETROGRADE URETHROGRAM;  Surgeon: Ann Barnacle, MD;  Location: [facility];  Service: Urology;  Laterality: N/A;   CYSTOSCOPY WITH URETHRAL DILATATION N/A 10/23/2015   Procedure: CYSTOSCOPY WITH URETHRAL BALLOON DILATATION;  Surgeon: Ann Barnacle, MD;  Location: [facility];  Service: Urology;  Laterality: N/A;  BALLOON DILATION    IVC FILTER PLACEMENT ([facility] HX)  2008   LAPAROSCOPIC CHOLECYSTECTOMY SINGLE PORT  N/A 01/09/2021   Procedure: LAPAROSCOPIC CHOLECYSTECTOMY WITH IOC;  Surgeon: Candyce Champagne, MD;  Location: [facility];  Service: General;  Laterality: N/A;  90 MIN   TRANSCAROTID ARTERY REVASCULARIZATION  Left 05/12/2021   Procedure: LEFT TRANSCAROTID ARTERY REVASCULARIZATION;  Surgeon: Young Hensen, MD;  Location: [facility] OR;  Service: Vascular;  Laterality: Left;   HPI:  Robert [name] is a 78 y.o. male with medical history significant of failure to thrive. Bed to wheelchair bound from home , with h/o Parkinson's ,CVA with left hemiparesis, DVT status post IVC , on eliquis , chronic indwelling urinary catheter and recurrent ESBL UTI,  presents to the ED due to confusion and weakness, malodorous urine. Wife reports he gets more weak and confused with every UTI. MBS on 08/19/23 indicate penetration with sequential sips of nectar thick liquids and moderate pharyngeal deficits.    Assessment / Plan / Recommendation  Clinical Impression  Pt seen for skilled ST services for swallowing evaluation. The pt is currently on a dys 3/thin diet and was assessed with thin liquid, puree, and soft solids. The pt's caregiver was present t/o the evaluation. Both the pt and his caregiver report sometimes taking large sips and having excessive oral holding (sometimes for twenty minutes). The pt reports occasionally this makes him get "choked". Pt recalled receiving a MBS in October but does not recall the  results or recommendations. The pts OME was generally unremarkable- he had mild lingual tremors likely secondary to PD and was missing several teeth. The pt consumed all consistencies with no overt s/s of aspiration aside from x1 delayed dry cough following a sip of thins. He had mild residue following soft solids intake which was adequately cleared with a liquid wash. The pt was educated on compensatory swallow strategies (small sips to reduce oral holding of thins and cued liquid washes every couple of bites). Both the pt  and the pt's caregiver verbally acknowledge understanding and are agreeable. Diet recs are dys 3/thin with STRICT aspiration precautions (small bites and sips, eat/drink slowly, sit upright for all meals and immediately following for at least 20 minutes, cued liquid washes every couple of bites) and frequent oral care. SLP to f/u for carryover of swallow strategies. SLP Visit Diagnosis: Dysphagia, unspecified (R13.10)    Aspiration Risk  No limitations    Diet Recommendation Dysphagia 3 (Mech soft);Thin liquid    Liquid Administration via: Cup Medication Administration: Whole meds with liquid Supervision: Full supervision/cueing for compensatory strategies;Intermittent supervision to cue for compensatory strategies Compensations: Minimize environmental distractions;Slow rate;Small sips/bites;Follow solids with liquid Postural Changes: Seated upright at 90 degrees;Remain upright for at least 30 minutes after po intake    Other  Recommendations Oral Care Recommendations: Oral care BID     Assistance Recommended at Discharge    Functional Status Assessment Patient has had a recent decline in their functional status and demonstrates the ability to make significant improvements in function in a reasonable and predictable amount of time.  Frequency and Duration min 2x/week  1 week       Prognosis Prognosis for improved oropharyngeal function: Good      Swallow Study   General Date of Onset: 03/31/24 HPI: Robert [name] is a 78 y.o. male with medical history significant of failure to thrive. Bed to wheelchair bound from home , with h/o Parkinson's ,CVA with left hemiparesis, DVT status post IVC , on eliquis , chronic indwelling urinary catheter and recurrent ESBL UTI,  presents to the ED due to confusion and weakness, malodorous urine. Wife reports he gets more weak and confused with every UTI. MBS on 08/19/23 indicate penetration with sequential sips of nectar thick liquids and moderate  pharyngeal deficits. Type of Study: Bedside Swallow Evaluation Previous Swallow Assessment: MBS on 08/19/23 (See full report, pharyngeal deficits present. Diet recs at the time were regular or soft solids with thins) Diet Prior to this Study: Dysphagia 3 (mechanical soft);Thin liquids (Level 0) Temperature Spikes Noted: No Respiratory Status: Room air History of Recent Intubation: No Behavior/Cognition: Alert;Cooperative;Pleasant mood Oral Cavity Assessment: Within Functional Limits Oral Care Completed by SLP: No Oral Cavity - Dentition: Missing dentition;Other (Comment) (Several teeth missing on top row) Vision: Functional for self-feeding Self-Feeding Abilities: Needs assist (Pt reports being in pain and requiring assistance for feeding) Patient Positioning: Upright in bed Baseline Vocal Quality: Low vocal intensity Volitional Cough: Strong Volitional Swallow: Able to elicit    Oral/Motor/Sensory Function Overall Oral Motor/Sensory Function: Mild impairment Facial ROM: Within Functional Limits Facial Symmetry: Within Functional Limits Facial Strength: Within Functional Limits Facial Sensation: Within Functional Limits Lingual ROM: Other (Comment);Within Functional Limits (Pt had mild tremors, likely secondary to PD dx) Lingual Symmetry: Within Functional Limits Lingual Strength: Within Functional Limits Lingual Sensation: Within Functional Limits   Ice Chips Ice chips: Not tested   Thin Liquid Thin Liquid: Within functional limits Presentation: Straw    Nectar Thick  Nectar Thick Liquid: Not tested   Honey Thick Honey Thick Liquid: Not tested   Puree Puree: Within functional limits Presentation: Spoon   Solid     Solid: Within functional limits Presentation: Spoon      Eda Gone M.S. CCC-SLP

## 2024-04-01 NOTE — Hospital Course (Signed)
 Robert [name] is a 78 y.o. 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  Patient presented secondary to confusion and weakness and found to have evidence of recurrent UTI.  Patient start empirically on antibiotics.  Blood and urine cultures obtained on admission. Blood cultures no growth to date. Urine culture significant for E. Coli. Patient transitioned to Ceftriaxone  IV and finally to Cefadroxil on discharge.

## 2024-04-01 NOTE — Evaluation (Signed)
 Physical Therapy Evaluation Patient Details Name: Robert [name] MRN: [id number] DOB: 1946/02/13 Today's Date: 04/01/2024  History of Present Illness  78 y.o. male presented secondary to confusion and weakness and found to have evidence of recurrent UTI.  Past medical history of failure to thrive, Parkinson disease, CVA left hemiparesis, DVT status post IVC on Eliquis , chronic indwelling urinary catheter, recurrent ESBL UTI, sacral wound  Clinical Impression  Pt admitted with above diagnosis.  Pt currently with functional limitations due to the deficits listed below (see PT Problem List). Pt will benefit from acute skilled PT to increase their independence and safety with mobility to allow discharge.  Pt assisted with sitting EOB and then requested to have BM.  Utilized stedy to perform sit to stands and use bed pan (in case pt fatigued quickly).  Pt assisted safely back to supine and repositioned end of session.  Spouse present and assisted pt as well.  She reports pt was doing well with HHPT until the start of his UTI.  Spouse plans for pt to return home and resume HHPT upon d/c.         If plan is discharge home, recommend the following: A lot of help with walking and/or transfers;A lot of help with bathing/dressing/bathroom;Assist for transportation;Assistance with cooking/housework   Can travel by private vehicle        Equipment Recommendations None recommended by PT  Recommendations for Other Services       Functional Status Assessment Patient has had a recent decline in their functional status and demonstrates the ability to make significant improvements in function in a reasonable and predictable amount of time.     Precautions / Restrictions Precautions Precautions: Fall Precaution/Restrictions Comments: residual L sided deficits      Mobility  Bed Mobility Overal bed mobility: Needs Assistance Bed Mobility: Supine to Sit, Sit to Supine     Supine to sit: Total assist,  +2 for physical assistance, HOB elevated, Used rails Sit to supine: Total assist, +2 for physical assistance   General bed mobility comments: pt reliant on at least lower body assist at baseline; assist for upper and lower body    Transfers Overall transfer level: Needs assistance   Transfers: Sit to/from Stand Sit to Stand: Mod assist, +2 physical assistance           General transfer comment: assist for placing Lt foot on stedy, spouse placed L hand onto stedy; pt assisting as able with pulling himself upright, required mod assist to rise; has flexion posture upon standing; performed twice (pt wanted to have BM so placed bed pan on bed for pt to sit) Transfer via Lift Equipment: Stedy  Ambulation/Gait                  Stairs            Wheelchair Mobility     Tilt Bed    Modified Rankin (Stroke Patients Only)       Balance Overall balance assessment: Needs assistance Sitting-balance support: Single extremity supported, No upper extremity supported, Feet supported Sitting balance-Leahy Scale: Poor     Standing balance support: During functional activity, Reliant on assistive device for balance, Bilateral upper extremity supported Standing balance-Leahy Scale: Poor                               Pertinent Vitals/Pain Pain Assessment Pain Assessment: No/denies pain    Home Living Family/patient expects  to be discharged to:: Private residence Living Arrangements: Spouse/significant other Available Help at Discharge: Family;Personal care attendant Type of Home: Apartment Home Access: Level entry       Home Layout: One level Home Equipment: Grab bars - tub/shower;Hand held shower head;Transport chair;Hospital bed (Stedy, hemiwalker)      Prior Function Prior Level of Function : Needs assist             Mobility Comments: non-ambulatory. STEDY transfers with wife/PCA ADLs Comments: Assist with all ADLs     Extremity/Trunk  Assessment   Upper Extremity Assessment Upper Extremity Assessment: LUE deficits/detail    Lower Extremity Assessment Lower Extremity Assessment: LLE deficits/detail LLE Deficits / Details: residual left hemiparesis, has AFO       Communication   Communication Communication: Impaired Factors Affecting Communication: Hearing impaired    Cognition Arousal: Alert Behavior During Therapy: Flat affect   PT - Cognitive impairments: History of cognitive impairments                                 Cueing       General Comments      Exercises     Assessment/Plan    PT Assessment Patient needs continued PT services  PT Problem List         PT Treatment Interventions DME instruction;Balance training;Functional mobility training;Therapeutic activities;Therapeutic exercise;Patient/family education    PT Goals (Current goals can be found in the Care Plan section)  Acute Rehab PT Goals PT Goal Formulation: With patient/family Time For Goal Achievement: 04/15/24 Potential to Achieve Goals: Good    Frequency Min 2X/week     Co-evaluation               AM-PAC PT "6 Clicks" Mobility  Outcome Measure Help needed turning from your back to your side while in a flat bed without using bedrails?: A Lot Help needed moving from lying on your back to sitting on the side of a flat bed without using bedrails?: Total Help needed moving to and from a bed to a chair (including a wheelchair)?: A Lot Help needed standing up from a chair using your arms (e.g., wheelchair or bedside chair)?: A Lot Help needed to walk in hospital room?: Total Help needed climbing 3-5 steps with a railing? : Total 6 Click Score: 9    End of Session   Activity Tolerance: Patient tolerated treatment well Patient left: in bed;with call bell/phone within reach;with bed alarm set;with family/visitor present Nurse Communication: Mobility status;Need for lift equipment PT Visit Diagnosis:  Muscle weakness (generalized) (M62.81);Other abnormalities of gait and mobility (R26.89)    Time: 1914-7829 PT Time Calculation (min) (ACUTE ONLY): 35 min   Charges:   PT Evaluation $PT Eval Moderate Complexity: 1 Mod PT Treatments $Therapeutic Activity: 8-22 mins PT General Charges $$ ACUTE PT VISIT: 1 Visit        Henretta Lodge PT, DPT Physical Therapist Acute Rehabilitation Services Office: [phone]   Myna Asal Payson 04/01/2024, 4:59 PM

## 2024-04-02 LAB — URINE CULTURE: Culture: >=100000 — AB

## 2024-04-02 MED ORDER — CEFADROXIL 500 MG PO CAPS: 500.0000 mg | ORAL_CAPSULE | Freq: Two times a day (BID) | ORAL | 0 refills | Status: AC

## 2024-04-02 MED ORDER — PSYLLIUM 95 % PO PACK
1.0000 | PACK | Freq: Every day | ORAL | Status: DC
  Administered 2024-04-02: 1 via ORAL
  Filled 2024-04-02: qty 1

## 2024-04-02 MED ORDER — SODIUM CHLORIDE 0.9 % IV SOLN
1.0000 g | INTRAVENOUS | Status: DC
  Filled 2024-04-02 (×2): qty 10

## 2024-04-02 MED ORDER — SODIUM CHLORIDE 0.9 % IV SOLN
1.0000 g | Freq: Once | INTRAVENOUS | Status: AC
  Administered 2024-04-02: 1 g via INTRAVENOUS
  Filled 2024-04-02: qty 10

## 2024-04-02 NOTE — Discharge Instructions (Signed)
 Robert [name],  You were in the hospital because of a urinary infection. This was treated with antibiotics. Please continue on discharge and follow-up with your PCP.

## 2024-04-02 NOTE — Progress Notes (Signed)
 Patient to be discharged to home this afternoon. All discharge instructions including all discharge Medications and schedules for these Medications reviewed with the Patient's Wife [name]. Understanding verbalized and discharge AVS with the Patient at time of discharge

## 2024-04-02 NOTE — TOC Initial Note (Addendum)
 Transition of Care [facility]) - Initial/Assessment Note    Patient Details  Name: Robert [name] MRN: [id number] Date of Birth: 06-02-1946  Transition of [facility]) CM/SW Contact:    Robert Leys, RN Phone Number: 04/02/2024, 1:15 PM  Clinical Narrative:  NCM met with pt's spouse, Robert [name], at bedside to introduce role of TOC/NCM and review for dc planning. [name], has home caregiver during week 8-2pm and on weekends 8-4pm, states after caregiver leaves she cares for patient herself, spouse became tearful during assessment, states she is overwhelmed, she has a son who lives in [place] , not much assistance or support from him. Spouse confirmed spouse has VA and an assigned VA. NCM encouraged spouse to speak with VA SW regarding LTC benefits, spouse voiced understanding. Order for [facility] PT/OT/SLP, spouse agreeable to [facility] PT/OT with [facility], prefers SLP though [place] , NCM unable to locate contact information, maybe arranged through [place]. PTAR for transport.                  Expected Discharge Plan: Home w Home Health Services Barriers to Discharge: Barriers Resolved   Patient Goals and CMS Choice Patient states their goals for this hospitalization and ongoing recovery are:: return home          Expected Discharge Plan and Services       Living arrangements for the past 2 months: Apartment                           HH Arranged: PT, OT, Speech Therapy HH Agency: [facility] ([facility]) Date HH Agency Contacted: 04/02/24 Time HH Agency Contacted: 1314 Representative spoke with at [facility] Agency: text sent to [place]  Prior Living Arrangements/Services Living arrangements for the past 2 months: Apartment Lives with:: Spouse Patient language and need for interpreter reviewed:: Yes Do you feel safe going back to the place where you live?: Yes      Need for Family Participation in Patient Care: Yes (Comment) Care giver support  system in place?: Yes (comment) Current home services: DME, Home PT, Home OT (HH PT/OT) Criminal Activity/Legal Involvement Pertinent to Current Situation/Hospitalization: No - Comment as needed  Activities of Daily Living   ADL Screening (condition at time of admission) Independently performs ADLs?: No Does the patient have a NEW difficulty with bathing/dressing/toileting/self-feeding that is expected to last >3 days?: No Does the patient have a NEW difficulty with getting in/out of bed, walking, or climbing stairs that is expected to last >3 days?: No Does the patient have a NEW difficulty with communication that is expected to last >3 days?: No Is the patient deaf or have difficulty hearing?: No Does the patient have difficulty seeing, even when wearing glasses/contacts?: No Does the patient have difficulty concentrating, remembering, or making decisions?: No  Permission Sought/Granted                  Emotional Assessment Appearance:: Appears stated age       Alcohol / Substance Use: Not Applicable Psych Involvement: No (comment)  Admission diagnosis:  Weakness [R53.1] Acute cystitis without hematuria [N30.00] Acute metabolic encephalopathy [G93.41] Patient Active Problem List   Diagnosis Date Noted   Acute metabolic encephalopathy 03/31/2024   Urinary tract infection due to ESBL Klebsiella 01/22/2024   Diabetes mellitus type II, non insulin  dependent (HCC) 02/10/2022   Embolic stroke (HCC) 05/15/2021   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) 05/15/2021   Asymptomatic carotid artery stenosis without infarction, left 05/12/2021   Carotid artery disease (HCC) 04/29/2021   Acute pancreatitis    Palliative care by specialist    Goals of care, counseling/discussion    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 01/07/2021   Gallstone pancreatitis 01/07/2021   Pressure injury of skin 01/05/2021   Sepsis secondary to UTI (HCC) 11/10/2020    AKI (acute kidney injury) (HCC) 11/10/2020   Hyperkalemia 11/10/2020   Parkinson's disease (HCC) 11/10/2020   Hyperglycemia 11/10/2020   Elevated LFTs 11/10/2020   Diarrhea    Other hydronephrosis    History of DVT    Urinary retention 08/14/2019   History of CVA with residual deficit    BPH/urethral stricture with chronic Foley catheter    Orthostatic hypotension 08/13/2019   Hyperlipidemia    History of CVA with residual left hemiparesis 12/06/2017   Gait abnormality 11/22/2017   UTI (urinary tract infection) 04/22/2017   Acute lower UTI 04/22/2017   Tremor 04/22/2017   CAUTI? 09/29/2012   Nausea 09/29/2012   Fever 09/29/2012   Uncontrolled hypertension 09/29/2012   H/O: CVA (cerebrovascular accident) 09/29/2012   Hearing loss 09/29/2012   PCP:  Lou Rounds, NP Pharmacy:   [facility] - [place], [place] - [address] [place] [address] Phone: [phone] Fax: [phone]     Social Drivers of Health (SDOH) Social History: SDOH Screenings   Food Insecurity: No Food Insecurity (03/31/2024)  Housing: Low Risk  (03/31/2024)  Transportation Needs: No Transportation Needs (03/31/2024)  Recent Concern: Transportation Needs - Unmet Transportation Needs (01/22/2024)  Utilities: At Risk (03/31/2024)  Depression (PHQ2-9): Low Risk  (01/22/2022)  Financial Resource Strain: Medium Risk (02/20/2020)  Social Connections: Socially Isolated (03/31/2024)  Tobacco Use: Medium Risk (03/31/2024)   SDOH Interventions:     Readmission Risk Interventions    04/02/2024    1:12 PM 04/02/2024   11:55 AM 02/11/2022   10:35 AM  Readmission Risk Prevention Plan  Transportation Screening Complete Complete Complete  PCP or Specialist Appt within 5-7 Days Complete Complete Complete  Home Care Screening Complete Complete Complete  Medication Review (RN CM) Complete Complete Complete

## 2024-04-02 NOTE — Discharge Summary (Signed)
 Physician Discharge Summary   Patient: Robert [name] MRN: [id number] DOB: [birth year]  Admit date:     03/31/2024  Discharge date: 04/02/24  Discharge Physician: Aneita Keens, MD   PCP: Lou Rounds, NP   Recommendations at discharge:  PCP visit for hospital follow-up  Discharge Diagnoses: Principal Problem:   Acute metabolic encephalopathy Active Problems:   Catheter-associated urinary tract infection (HCC)   History of DVT   Parkinson's disease (HCC)   Pressure injury of skin  Resolved Problems:   * No resolved hospital problems. *  Hospital Course: Robert [name] is a 78 y.o. 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  Patient presented secondary to confusion and weakness and found to have evidence of recurrent UTI.  Patient start empirically on antibiotics.  Blood and urine cultures obtained on admission. Blood cultures no growth to date. Urine culture significant for E. Coli. Patient transitioned to Ceftriaxone  IV and finally to Cefadroxil on discharge.  Assessment and Plan:  Acute metabolic encephalopathy Appears to be secondary to acute infection secondary to UTI.  Per wife, mental status is significant improved from admission. Resolved with infection treatment.   Catheter associated UTI Present on admission.  Urine culture obtained on admission prior to Foley catheter exchange.  Patient start empirically on meropenem  secondary to history of recurrent ESBL UTI.  Blood cultures also obtained on admission and are negative. Urine culture significant for E. Coli. Patient given one dose of Ceftriaxone  IV and will continue on Cefadroxil on discharge.   History of DVT Continue Eliquis    History of CVA Left-sided hemiparesis Patient is on Eliquis  and aspirin  as an outpatient. Continue Eliquis  and aspirin .  Dysphagia Patient seen and evaluated by  speech therapy. Speech therapy recommendations for dysphagia 3 diet and home speech therapy follow-up on discharge.   Parkinson disease Patient is on rasagiline , Sinemet , Namenda , BuSpar  as an outpatient.  Patient is bed to wheelchair bound. PT recommending home health therapy. Continue rasagiline , Sinemet , Namenda , BuSpar .   Pressure injury Left/right buttocks.  Present on admission.  Wound care consulted.   Consultants: None Procedures performed: None  Disposition: Home health Diet recommendation: Dysphagia type 3 Thin Liquid  DISCHARGE MEDICATION: Allergies as of 04/02/2024       Reactions   Fluconazole Rash, Dermatitis   Propofol  Other (See Comments)   "Hiccups for weeks"   Lisinopril Cough   Sulfamethoxazole -trimethoprim  Diarrhea   Fluoxetine Nausea Only, Other (See Comments)   Hallucinations, Feeling nervous, Nausea, Tachycardia also   Nystatin Rash        Medication List     PAUSE taking these medications    Dupilumab 300 MG/2ML Soaj Wait to take this until your doctor or other care provider tells you to start again. Inject 300 mg into the skin.       TAKE these medications    acetaminophen  500 MG tablet Commonly known as: TYLENOL  Take 1,000 mg by mouth every 6 (six) hours as needed for mild pain (pain score 1-3), moderate pain (pain score 4-6) or fever.   apixaban  5 MG Tabs tablet Commonly known as: ELIQUIS  Take 1 tablet (5 mg total) by mouth 2 (two) times daily.   ascorbic acid 250 MG tablet Commonly known as: VITAMIN C Take 250 mg by mouth in the morning and at bedtime.   aspirin  EC 81 MG tablet Take 81 mg by mouth daily. Swallow whole.  bacitracin 500 UNIT/GM ointment Apply 1 Application topically daily as needed for wound care (near catheter).   busPIRone  10 MG tablet Commonly known as: BUSPAR  Take 5-10 mg by mouth See admin instructions. Take 10 mg by mouth in the morning and 5 mg at noon & bedtime Notes to patient: Please follow Medication  dosing instructions Last Dose given 03/30/2024 at 9:59 am   cefadroxil 500 MG capsule Commonly known as: DURICEF Take 1 capsule (500 mg total) by mouth 2 (two) times daily for 4 days. Start taking on: April 03, 2024   Cholecalciferol 50 MCG (2000 UT) Tabs Take 2,000 Units by mouth daily.   HYDROCORTISONE  ACE (RECTAL) 30 MG Supp Place 30 mg rectally at bedtime.   melatonin 3 MG Tabs tablet Take 6 mg by mouth at bedtime.   memantine  10 MG tablet Commonly known as: NAMENDA  Take 20 mg by mouth at bedtime.   metFORMIN  500 MG tablet Commonly known as: GLUCOPHAGE  Take 500 mg by mouth at bedtime.   methenamine 1 g tablet Commonly known as: MANDELAMINE Take 1,000 mg by mouth See admin instructions. Crush 1,000 mg and place into applesauce- take morning and bedtime   miconazole 2 % powder Commonly known as: MICOTIN Apply 1 Application topically See admin instructions. Apply in the groin area once a day as needed   Nuplazid  34 MG Caps Generic drug: Pimavanserin  Tartrate Take 34 mg by mouth daily.   nystatin-triamcinolone ointment Commonly known as: MYCOLOG Apply 1 application  topically as needed (rash/yeast).   omeprazole 20 MG capsule Commonly known as: PRILOSEC Take 20 mg by mouth daily before breakfast.   oxybutynin  5 MG tablet Commonly known as: DITROPAN  Take 2.5 mg by mouth in the morning and at bedtime.   psyllium 58.6 % powder Commonly known as: METAMUCIL See admin instructions. Mix 2 teaspoonsful of powder into 16 ounces of water and drink by mouth once a day   rasagiline  1 MG Tabs tablet Commonly known as: AZILECT  Take 1 mg by mouth daily.   senna-docusate 8.6-50 MG tablet Commonly known as: Senokot-S Take 2 tablets by mouth daily as needed for mild constipation or moderate constipation.   simvastatin  20 MG tablet Commonly known as: ZOCOR  Take 10 mg by mouth at bedtime.   Sinemet  25-100 MG tablet Generic drug: carbidopa -levodopa  Take 2-3 tablets by mouth  See admin instructions. Take 3 tablets by mouth at 7 AM & 12 Noon and 2 tablets between 5-6 PM/after the evening meal Notes to patient: Please follow dosing instructions for this Medication   tacrolimus 0.1 % ointment Commonly known as: PROTOPIC Apply 1 application  topically See admin instructions. Apply to groin, armpits, and bottom 2 times a day when skin is broken out   triamcinolone cream 0.1 % Commonly known as: KENALOG Apply 1 Application topically daily as needed (rash).   zinc  oxide 20 % ointment Apply 1 Application topically See admin instructions. Every time there is a diaper change What changed: additional instructions        Follow-up Information     [facility]  Follow up.   Why: [facility] Physical Therapy/Occupational Therapy/Speech Language Therapy Contact information: [address] [name] [address] [phone]         Lou Rounds, NP. Schedule an appointment as soon as possible for a visit in 1 week(s).   Why: For hospital follow-up Contact information: [address] [place] [address] [phone]  Discharge Exam: BP (!) 144/73 (BP Location: Left Arm)   Pulse 88   Temp 98.7 F (37.1 C) (Oral)   Resp 20   Ht 6\' 2"  (1.88 m)   Wt 99.4 kg   SpO2 99%   BMI 28.14 kg/m   General exam: Appears calm and comfortable Respiratory system: Clear to auscultation. Respiratory effort normal. Cardiovascular system: S1 & S2 heard, RRR. No murmurs. Gastrointestinal system: Abdomen is nondistended, soft and nontender. Normal bowel sounds heard. Central nervous system: Alert.  Condition at discharge: stable  The results of significant diagnostics from this hospitalization (including imaging, microbiology, ancillary and laboratory) are listed below for reference.   Imaging Studies: CT Head Wo Contrast Result Date: 03/31/2024 CLINICAL DATA:  Mental status change,  unknown cause EXAM: CT HEAD WITHOUT CONTRAST TECHNIQUE: Contiguous axial images were obtained from the base of the skull through the vertex without intravenous contrast. RADIATION DOSE REDUCTION: This exam was performed according to the departmental dose-optimization program which includes automated exposure control, adjustment of the mA and/or kV according to patient size and/or use of iterative reconstruction technique. COMPARISON:  05/12/2021 FINDINGS: Brain: Large area of encephalomalacia involving the right frontal lobe is stable since prior study. There is atrophy and chronic small vessel disease changes. No acute intracranial abnormality. Specifically, no hemorrhage, hydrocephalus, mass lesion, acute infarction, or significant intracranial injury. Vascular: No hyperdense vessel or unexpected calcification. Skull: No acute calvarial abnormality. Sinuses/Orbits: No acute findings Other: None IMPRESSION: Atrophy, chronic microvascular disease. No acute intracranial abnormality. Stable large area of encephalomalacia in the right frontal lobe. Electronically Signed   By: Janeece Mechanic M.D.   On: 03/31/2024 17:20   DG Chest 1 View Result Date: 03/31/2024 CLINICAL DATA:  Urinary tract infection. EXAM: CHEST  1 VIEW COMPARISON:  01/22/2024 FINDINGS: The heart remains near the upper limit of normal in size. Tortuous and partially calcified thoracic aorta. Stable linear scarring at the left lung base. Otherwise, clear lungs. Stable prominent upper lung zone pulmonary vasculature. No pleural fluid. Bilateral shoulder degenerative changes with superior migration of the humeral heads is again demonstrated. IMPRESSION: 1. No acute abnormality. 2. Stable borderline cardiomegaly and mild pulmonary vascular congestion. 3. Stable bilateral shoulder degenerative changes with evidence of chronic bilateral rotator cuff tears. Electronically Signed   By: Catherin Closs M.D.   On: 03/31/2024 15:34   VAS US  CAROTID Result Date:  03/21/2024 Carotid Arterial Duplex Study Patient Name:  Joffrey Kerce  Date of Exam:   03/21/2024 Medical Rec #: [id number]       Accession #:    [id number] Date of Birth: 01/17/1946       Patient Gender: M Patient Age:   16 years Exam Location:  [name] Procedure:      VAS US  CAROTID Referring Phys: Jimmye Moulds --------------------------------------------------------------------------------  Indications:       Carotid artery disease. Risk Factors:      Hypertension, hyperlipidemia, prior CVA. Other Factors:     05/12/21: Left TCAR. Known right ICA occlusion. Limitations        Today's exam was limited due to Exam performed in WC. Comparison Study:  No change since prior exam of 03/16/2023 Performing Technologist: Homer Lust RVT  Examination Guidelines: A complete evaluation includes B-mode imaging, spectral Doppler, color Doppler, and power Doppler as needed of all accessible portions of each vessel. Bilateral testing is considered an integral part of a complete examination. Limited examinations for reoccurring indications may be performed as noted.  Right Carotid Findings: +----------+--------+--------+--------+------------------+--------+  PSV cm/sEDV cm/sStenosisPlaque DescriptionComments +----------+--------+--------+--------+------------------+--------+ CCA Prox  65      0                                          +----------+--------+--------+--------+------------------+--------+ CCA Mid   83      5                                          +----------+--------+--------+--------+------------------+--------+ CCA Distal46      4                                          +----------+--------+--------+--------+------------------+--------+ ICA Prox                  Occluded                           +----------+--------+--------+--------+------------------+--------+ ICA Mid   96      96      Occluded                            +----------+--------+--------+--------+------------------+--------+ ICA Distal                Occluded                           +----------+--------+--------+--------+------------------+--------+ ECA       101     0                                          +----------+--------+--------+--------+------------------+--------+ +----------+--------+-------+----------------+-------------------+           PSV cm/sEDV cmsDescribe        Arm Pressure (mmHG) +----------+--------+-------+----------------+-------------------+ Subclavian122     2      Multiphasic, [id number]                 +----------+--------+-------+----------------+-------------------+ +---------+--------+--+--------+-+---------+ VertebralPSV cm/s34EDV cm/s9Antegrade +---------+--------+--+--------+-+---------+  Left Carotid Findings: +----------+--------+--------+--------+------------------+--------+           PSV cm/sEDV cm/sStenosisPlaque DescriptionComments +----------+--------+--------+--------+------------------+--------+ CCA Prox  135     16                                         +----------+--------+--------+--------+------------------+--------+ CCA Mid   93      13                                         +----------+--------+--------+--------+------------------+--------+ CCA Distal74      14                                         +----------+--------+--------+--------+------------------+--------+ ICA Prox  stent    +----------+--------+--------+--------+------------------+--------+ ICA Mid                                             stent    +----------+--------+--------+--------+------------------+--------+ ICA Distal79      18                                         +----------+--------+--------+--------+------------------+--------+ ECA       74      1                                           +----------+--------+--------+--------+------------------+--------+ +----------+--------+--------+----------------+-------------------+           PSV cm/sEDV cm/sDescribe        Arm Pressure (mmHG) +----------+--------+--------+----------------+-------------------+ Subclavian                Multiphasic, [id number]                 +----------+--------+--------+----------------+-------------------+ +---------+--------+--+--------+--+---------+ VertebralPSV cm/s51EDV cm/s12Antegrade +---------+--------+--+--------+--+---------+  Left Stent(s): +---------------+--+--++++ Prox to Stent  0454 +---------------+--+--++++ Proximal Stent 9317 +---------------+--+--++++ Mid Stent      8020 +---------------+--+--++++ Distal Stent   8321 +---------------+--+--++++ Distal to Stent8022 +---------------+--+--++++    Summary: Right Carotid: Evidence consistent with a total occlusion of the right ICA. Left Carotid: Patent stent with no evidence for restenosis. Vertebrals:  Bilateral vertebral arteries demonstrate antegrade flow. Subclavians: Normal flow hemodynamics were seen in bilateral subclavian              arteries. *See table(s) above for measurements and observations.  Electronically signed by Jimmye Moulds MD on 03/21/2024 at 11:10:19 AM.    Final     Microbiology: Results for orders placed or performed during the hospital encounter of 03/31/24  Urine Culture     Status: Abnormal   Collection Time: 03/31/24  3:09 PM   Specimen: Urine, Random  Result Value Ref Range Status   Specimen Description   Final    URINE, RANDOM Performed at [facility], [address] [place], [place] [address]    Special Requests   Final    NONE Reflexed from [id number] Performed at [facility], [address], [place], [place] [address]    Culture >=100,000 COLONIES/mL ESCHERICHIA COLI (A)  Final   Report Status 04/02/2024 FINAL  Final    Organism ID, Bacteria ESCHERICHIA COLI (A)  Final      Susceptibility   Escherichia coli - MIC*    AMPICILLIN 16 INTERMEDIATE Intermediate     CEFAZOLIN  <=4 SENSITIVE Sensitive     CEFEPIME  <=0.12 SENSITIVE Sensitive     CEFTRIAXONE  <=0.25 SENSITIVE Sensitive     CIPROFLOXACIN  <=0.25 SENSITIVE Sensitive     GENTAMICIN <=1 SENSITIVE Sensitive     IMIPENEM <=0.25 SENSITIVE Sensitive     NITROFURANTOIN <=16 SENSITIVE Sensitive     TRIMETH /SULFA  <=20 SENSITIVE Sensitive     AMPICILLIN/SULBACTAM 8 SENSITIVE Sensitive     PIP/TAZO <=4 SENSITIVE Sensitive ug/mL    * >=100,000 COLONIES/mL ESCHERICHIA COLI  Blood culture (routine x 2)     Status: None (Preliminary result)   Collection Time: 03/31/24  4:45 PM   Specimen:  BLOOD  Result Value Ref Range Status   Specimen Description   Final    BLOOD LEFT ANTECUBITAL Performed at [facility], [address], [place], [place] [address]    Special Requests   Final    BOTTLES DRAWN AEROBIC AND ANAEROBIC Blood Culture results may not be optimal due to an inadequate volume of blood received in culture bottles Performed at [facility], [address], [place], [place] [address]    Culture   Final    NO GROWTH < 24 HOURS Performed at [facility], [address] [place], [place] [address]    Report Status PENDING  Incomplete  Blood culture (routine x 2)     Status: None (Preliminary result)   Collection Time: 03/31/24  4:55 PM   Specimen: BLOOD  Result Value Ref Range Status   Specimen Description   Final    BLOOD RIGHT ANTECUBITAL Performed at [facility], [address] [place], [place] [address]    Special Requests   Final    BOTTLES DRAWN AEROBIC AND ANAEROBIC Blood Culture results may not be optimal due to an inadequate volume of blood received in culture bottles Performed at [facility], [address] [place], [place] [address]    Culture   Final     NO GROWTH < 24 HOURS Performed at [facility], [address] [place], [place] [address]    Report Status PENDING  Incomplete    Labs: CBC: Recent Labs  Lab 03/31/24 1545 04/01/24 0542  WBC 6.3 6.8  NEUTROABS 3.8  --   HGB 14.0 13.5  HCT 43.6 43.4  MCV 93.0 95.8  PLT 271 251   Basic Metabolic Panel: Recent Labs  Lab 03/31/24 1545 04/01/24 0542  NA 138 139  K 3.8 3.8  CL 102 107  CO2 27 25  GLUCOSE 139* 133*  BUN 15 13  CREATININE 1.00 0.84  CALCIUM 8.9 8.9   Liver Function Tests: Recent Labs  Lab 03/31/24 1545 04/01/24 0542  AST 8* 6*  ALT 11 7  ALKPHOS 73 62  BILITOT 1.0 0.9  PROT 7.1 6.0*  ALBUMIN 3.5 3.0*   CBG: Recent Labs  Lab 03/31/24 1514  GLUCAP 138*    Discharge time spent: 35 minutes.  Signed: Aneita Keens, MD [facility] 04/02/2024

## 2024-04-02 NOTE — TOC Transition Note (Addendum)
 Transition of Care [facility]) - Discharge Note   Patient Details  Name: Robert [name] MRN: [id number] Date of Birth: [birth year]  Transition of [facility]) CM/SW Contact:  Bari Leys, RN Phone Number: 04/02/2024, 11:57 AM   Clinical Narrative:   DC to Home order, [facility] for transport. Resume HH with [facility] ( PT/OT/SLP), added to AVS. No further TOC needs.     Final next level of care: Home w Home Health Services Barriers to Discharge: Barriers Resolved   Patient Goals and CMS Choice Patient states their goals for this hospitalization and ongoing recovery are:: return home          Discharge Placement                Patient to be transferred to facility by: PTAR Name of family member notified: Delia Sitar (spouse) Patient and family notified of of transfer: 04/02/24  Discharge Plan and Services Additional resources added to the After Visit Summary for                                       Social Drivers of Health (SDOH) Interventions SDOH Screenings   Food Insecurity: No Food Insecurity (03/31/2024)  Housing: Low Risk  (03/31/2024)  Transportation Needs: No Transportation Needs (03/31/2024)  Recent Concern: Transportation Needs - Unmet Transportation Needs (01/22/2024)  Utilities: At Risk (03/31/2024)  Depression (PHQ2-9): Low Risk  (01/22/2022)  Financial Resource Strain: Medium Risk (02/20/2020)  Social Connections: Socially Isolated (03/31/2024)  Tobacco Use: Medium Risk (03/31/2024)     Readmission Risk Interventions    04/02/2024   11:55 AM 02/11/2022   10:35 AM  Readmission Risk Prevention Plan  Transportation Screening Complete Complete  PCP or Specialist Appt within 5-7 Days Complete Complete  Home Care Screening Complete Complete  Medication Review (RN CM) Complete Complete

## 2024-04-05 LAB — CULTURE, BLOOD (ROUTINE X 2)
Culture: NO GROWTH
Culture: NO GROWTH

## 2024-04-07 ENCOUNTER — Other Ambulatory Visit (HOSPITAL_COMMUNITY): Payer: Self-pay | Admitting: Student

## 2024-04-07 DIAGNOSIS — R339 Retention of urine, unspecified: Secondary | ICD-10-CM

## 2024-04-10 ENCOUNTER — Encounter (HOSPITAL_COMMUNITY): Payer: Self-pay

## 2024-04-10 ENCOUNTER — Ambulatory Visit (HOSPITAL_COMMUNITY)
Admission: RE | Admit: 2024-04-10 | Discharge: 2024-04-10 | Disposition: A | Discharge status: Home / Self Care | Source: Ambulatory Visit | Attending: Urology | Admitting: Urology

## 2024-04-10 ENCOUNTER — Inpatient Hospital Stay (HOSPITAL_COMMUNITY)
Admission: EM | Admit: 2024-04-10 | Discharge: 2024-04-14 | DRG: 920 | Disposition: A | Discharge status: Home Health | Attending: Internal Medicine | Admitting: Internal Medicine

## 2024-04-10 ENCOUNTER — Emergency Department (HOSPITAL_COMMUNITY)

## 2024-04-10 ENCOUNTER — Other Ambulatory Visit: Payer: Self-pay

## 2024-04-10 DIAGNOSIS — N136 Pyonephrosis: Secondary | ICD-10-CM | POA: Diagnosis present

## 2024-04-10 DIAGNOSIS — N99820 Postprocedural hemorrhage and hematoma of a genitourinary system organ or structure following a genitourinary system procedure: Principal | ICD-10-CM | POA: Diagnosis present

## 2024-04-10 DIAGNOSIS — Z86718 Personal history of other venous thrombosis and embolism: Secondary | ICD-10-CM | POA: Diagnosis not present

## 2024-04-10 DIAGNOSIS — D6832 Hemorrhagic disorder due to extrinsic circulating anticoagulants: Secondary | ICD-10-CM | POA: Diagnosis present

## 2024-04-10 DIAGNOSIS — R6510 Systemic inflammatory response syndrome (SIRS) of non-infectious origin without acute organ dysfunction: Secondary | ICD-10-CM | POA: Diagnosis present

## 2024-04-10 DIAGNOSIS — T45515A Adverse effect of anticoagulants, initial encounter: Secondary | ICD-10-CM | POA: Diagnosis present

## 2024-04-10 DIAGNOSIS — I110 Hypertensive heart disease with heart failure: Secondary | ICD-10-CM | POA: Diagnosis present

## 2024-04-10 DIAGNOSIS — B961 Klebsiella pneumoniae [K. pneumoniae] as the cause of diseases classified elsewhere: Secondary | ICD-10-CM | POA: Diagnosis present

## 2024-04-10 DIAGNOSIS — Z87891 Personal history of nicotine dependence: Secondary | ICD-10-CM | POA: Insufficient documentation

## 2024-04-10 DIAGNOSIS — F0280 Dementia in other diseases classified elsewhere without behavioral disturbance: Secondary | ICD-10-CM | POA: Diagnosis present

## 2024-04-10 DIAGNOSIS — I69354 Hemiplegia and hemiparesis following cerebral infarction affecting left non-dominant side: Secondary | ICD-10-CM | POA: Diagnosis not present

## 2024-04-10 DIAGNOSIS — N323 Diverticulum of bladder: Secondary | ICD-10-CM | POA: Diagnosis present

## 2024-04-10 DIAGNOSIS — E872 Acidosis, unspecified: Secondary | ICD-10-CM | POA: Diagnosis present

## 2024-04-10 DIAGNOSIS — Z820 Family history of epilepsy and other diseases of the nervous system: Secondary | ICD-10-CM

## 2024-04-10 DIAGNOSIS — Z888 Allergy status to other drugs, medicaments and biological substances status: Secondary | ICD-10-CM

## 2024-04-10 DIAGNOSIS — Z801 Family history of malignant neoplasm of trachea, bronchus and lung: Secondary | ICD-10-CM

## 2024-04-10 DIAGNOSIS — N319 Neuromuscular dysfunction of bladder, unspecified: Secondary | ICD-10-CM | POA: Diagnosis present

## 2024-04-10 DIAGNOSIS — N400 Enlarged prostate without lower urinary tract symptoms: Secondary | ICD-10-CM | POA: Diagnosis present

## 2024-04-10 DIAGNOSIS — R339 Retention of urine, unspecified: Secondary | ICD-10-CM | POA: Diagnosis present

## 2024-04-10 DIAGNOSIS — E782 Mixed hyperlipidemia: Secondary | ICD-10-CM | POA: Diagnosis not present

## 2024-04-10 DIAGNOSIS — Z833 Family history of diabetes mellitus: Secondary | ICD-10-CM

## 2024-04-10 DIAGNOSIS — E119 Type 2 diabetes mellitus without complications: Secondary | ICD-10-CM | POA: Diagnosis present

## 2024-04-10 DIAGNOSIS — Z960 Presence of urogenital implants: Secondary | ICD-10-CM | POA: Diagnosis present

## 2024-04-10 DIAGNOSIS — R310 Gross hematuria: Secondary | ICD-10-CM | POA: Diagnosis present

## 2024-04-10 DIAGNOSIS — K219 Gastro-esophageal reflux disease without esophagitis: Secondary | ICD-10-CM | POA: Diagnosis present

## 2024-04-10 DIAGNOSIS — R000 Tachycardia, unspecified: Secondary | ICD-10-CM | POA: Diagnosis present

## 2024-04-10 DIAGNOSIS — Z79899 Other long term (current) drug therapy: Secondary | ICD-10-CM | POA: Diagnosis not present

## 2024-04-10 DIAGNOSIS — E7800 Pure hypercholesterolemia, unspecified: Secondary | ICD-10-CM | POA: Diagnosis present

## 2024-04-10 DIAGNOSIS — Z7984 Long term (current) use of oral hypoglycemic drugs: Secondary | ICD-10-CM | POA: Diagnosis not present

## 2024-04-10 DIAGNOSIS — A419 Sepsis, unspecified organism: Secondary | ICD-10-CM | POA: Diagnosis present

## 2024-04-10 DIAGNOSIS — Z8673 Personal history of transient ischemic attack (TIA), and cerebral infarction without residual deficits: Secondary | ICD-10-CM | POA: Insufficient documentation

## 2024-04-10 DIAGNOSIS — L89312 Pressure ulcer of right buttock, stage 2: Secondary | ICD-10-CM | POA: Diagnosis present

## 2024-04-10 DIAGNOSIS — G20A1 Parkinson's disease without dyskinesia, without mention of fluctuations: Secondary | ICD-10-CM | POA: Diagnosis present

## 2024-04-10 DIAGNOSIS — Z8042 Family history of malignant neoplasm of prostate: Secondary | ICD-10-CM

## 2024-04-10 DIAGNOSIS — Z7901 Long term (current) use of anticoagulants: Secondary | ICD-10-CM | POA: Diagnosis not present

## 2024-04-10 DIAGNOSIS — I5032 Chronic diastolic (congestive) heart failure: Secondary | ICD-10-CM | POA: Diagnosis present

## 2024-04-10 DIAGNOSIS — E785 Hyperlipidemia, unspecified: Secondary | ICD-10-CM | POA: Diagnosis present

## 2024-04-10 DIAGNOSIS — Y838 Other surgical procedures as the cause of abnormal reaction of the patient, or of later complication, without mention of misadventure at the time of the procedure: Secondary | ICD-10-CM | POA: Diagnosis present

## 2024-04-10 DIAGNOSIS — Z7982 Long term (current) use of aspirin: Secondary | ICD-10-CM

## 2024-04-10 DIAGNOSIS — Z974 Presence of external hearing-aid: Secondary | ICD-10-CM

## 2024-04-10 DIAGNOSIS — Z1152 Encounter for screening for COVID-19: Secondary | ICD-10-CM

## 2024-04-10 DIAGNOSIS — Z882 Allergy status to sulfonamides status: Secondary | ICD-10-CM

## 2024-04-10 DIAGNOSIS — N1330 Unspecified hydronephrosis: Secondary | ICD-10-CM

## 2024-04-10 DIAGNOSIS — Z883 Allergy status to other anti-infective agents status: Secondary | ICD-10-CM

## 2024-04-10 DIAGNOSIS — R319 Hematuria, unspecified: Secondary | ICD-10-CM | POA: Diagnosis present

## 2024-04-10 LAB — BASIC METABOLIC PANEL WITH GFR
Anion gap: 11 (ref 5–15)
BUN: 15 mg/dL (ref 8–23)
CO2: 26 mmol/L (ref 22–32)
Calcium: 9.3 mg/dL (ref 8.9–10.3)
Chloride: 100 mmol/L (ref 98–111)
Creatinine, Ser: 1.11 mg/dL (ref 0.61–1.24)
GFR, Estimated: >60 mL/min (ref >60)
Glucose, Bld: 156 mg/dL — ABNORMAL HIGH (ref 70–99)
Potassium: 4.2 mmol/L (ref 3.5–5.1)
Sodium: 137 mmol/L (ref 135–145)

## 2024-04-10 LAB — CBC WITH DIFFERENTIAL/PLATELET
Abs Immature Granulocytes: 0.04 10*3/uL (ref 0.00–0.07)
Basophils Absolute: 0.1 10*3/uL (ref 0.0–0.1)
Basophils Relative: 1 %
Eosinophils Absolute: 0.1 10*3/uL (ref 0.0–0.5)
Eosinophils Relative: 1 %
HCT: 44.5 % (ref 39.0–52.0)
Hemoglobin: 14.4 g/dL (ref 13.0–17.0)
Immature Granulocytes: 0 %
Lymphocytes Relative: 10 %
Lymphs Abs: 1 10*3/uL (ref 0.7–4.0)
MCH: 29.6 pg (ref 26.0–34.0)
MCHC: 32.4 g/dL (ref 30.0–36.0)
MCV: 91.6 fL (ref 80.0–100.0)
Monocytes Absolute: 1.1 10*3/uL — ABNORMAL HIGH (ref 0.1–1.0)
Monocytes Relative: 11 %
Neutro Abs: 8.1 10*3/uL — ABNORMAL HIGH (ref 1.7–7.7)
Neutrophils Relative %: 77 %
Platelets: 312 10*3/uL (ref 150–400)
RBC: 4.86 MIL/uL (ref 4.22–5.81)
RDW: 13.1 % (ref 11.5–15.5)
WBC: 10.5 10*3/uL (ref 4.0–10.5)
nRBC: 0 % (ref 0.0–0.2)

## 2024-04-10 LAB — RESP PANEL BY RT-PCR (RSV, FLU A&B, COVID)  RVPGX2
Influenza A by PCR: NEGATIVE
Influenza B by PCR: NEGATIVE
Resp Syncytial Virus by PCR: NEGATIVE
SARS Coronavirus 2 by RT PCR: NEGATIVE

## 2024-04-10 LAB — CBC
HCT: 45.8 % (ref 39.0–52.0)
Hemoglobin: 14.3 g/dL (ref 13.0–17.0)
MCH: 29.2 pg (ref 26.0–34.0)
MCHC: 31.2 g/dL (ref 30.0–36.0)
MCV: 93.5 fL (ref 80.0–100.0)
Platelets: 317 10*3/uL (ref 150–400)
RBC: 4.9 MIL/uL (ref 4.22–5.81)
RDW: 13.2 % (ref 11.5–15.5)
WBC: 6.2 10*3/uL (ref 4.0–10.5)
nRBC: 0 % (ref 0.0–0.2)

## 2024-04-10 LAB — URINALYSIS, W/ REFLEX TO CULTURE (INFECTION SUSPECTED): RBC / HPF: >50 RBC/hpf (ref 0–5)

## 2024-04-10 LAB — PROTIME-INR
INR: 1.1 (ref 0.8–1.2)
INR: 1.1 (ref 0.8–1.2)
Prothrombin Time: 14.6 s (ref 11.4–15.2)
Prothrombin Time: 14.9 s (ref 11.4–15.2)

## 2024-04-10 LAB — I-STAT CG4 LACTIC ACID, ED: Lactic Acid, Venous: 1.6 mmol/L (ref 0.5–1.9)

## 2024-04-10 LAB — GLUCOSE, CAPILLARY: Glucose-Capillary: 142 mg/dL — ABNORMAL HIGH (ref 70–99)

## 2024-04-10 LAB — MAGNESIUM: Magnesium: 1.7 mg/dL (ref 1.7–2.4)

## 2024-04-10 LAB — TYPE AND SCREEN
ABO/RH(D): O POS
Antibody Screen: NEGATIVE

## 2024-04-10 MED ORDER — ACETAMINOPHEN 650 MG RE SUPP: 650.0000 mg | Freq: Four times a day (QID) | RECTAL | Status: DC | PRN

## 2024-04-10 MED ORDER — LIDOCAINE HCL 1 % IJ SOLN
10.0000 mL | Freq: Once | INTRAMUSCULAR | Status: AC
  Administered 2024-04-10: 10 mL via INTRADERMAL

## 2024-04-10 MED ORDER — CEFAZOLIN SODIUM-DEXTROSE 2-4 GM/100ML-% IV SOLN
INTRAVENOUS | Status: AC | PRN
  Administered 2024-04-10: 2 g via INTRAVENOUS

## 2024-04-10 MED ORDER — FENTANYL CITRATE PF 50 MCG/ML IJ SOSY
50.0000 ug | PREFILLED_SYRINGE | Freq: Once | INTRAMUSCULAR | Status: AC
  Administered 2024-04-10: 50 ug via INTRAVENOUS
  Filled 2024-04-10: qty 1

## 2024-04-10 MED ORDER — FENTANYL CITRATE PF 50 MCG/ML IJ SOSY
50.0000 ug | PREFILLED_SYRINGE | INTRAMUSCULAR | Status: DC | PRN
  Administered 2024-04-11: 50 ug via INTRAVENOUS
  Filled 2024-04-10: qty 1

## 2024-04-10 MED ORDER — SODIUM CHLORIDE 0.9 % IV SOLN: 2.0000 g | Freq: Once | INTRAVENOUS | Status: DC

## 2024-04-10 MED ORDER — MELATONIN 3 MG PO TABS: 3.0000 mg | ORAL_TABLET | Freq: Every evening | ORAL | Status: DC | PRN

## 2024-04-10 MED ORDER — ONDANSETRON HCL 4 MG/2ML IJ SOLN: 4.0000 mg | Freq: Four times a day (QID) | INTRAMUSCULAR | Status: DC | PRN

## 2024-04-10 MED ORDER — MIDAZOLAM HCL 2 MG/2ML IJ SOLN
INTRAMUSCULAR | Status: AC | PRN
  Administered 2024-04-10: .5 mg via INTRAVENOUS
  Administered 2024-04-10: 1 mg via INTRAVENOUS
  Administered 2024-04-10: .5 mg via INTRAVENOUS

## 2024-04-10 MED ORDER — SODIUM CHLORIDE 0.9 % IV SOLN
1.0000 g | Freq: Three times a day (TID) | INTRAVENOUS | Status: DC
  Administered 2024-04-11 – 2024-04-13 (×6): 1 g via INTRAVENOUS
  Filled 2024-04-10 (×8): qty 20

## 2024-04-10 MED ORDER — ACETAMINOPHEN 325 MG PO TABS
650.0000 mg | ORAL_TABLET | Freq: Four times a day (QID) | ORAL | Status: DC | PRN
  Administered 2024-04-14: 650 mg via ORAL
  Filled 2024-04-10: qty 2

## 2024-04-10 MED ORDER — ACETAMINOPHEN 325 MG PO TABS
650.0000 mg | ORAL_TABLET | Freq: Once | ORAL | Status: AC
  Administered 2024-04-10: 650 mg via ORAL
  Filled 2024-04-10: qty 2

## 2024-04-10 MED ORDER — SODIUM CHLORIDE 0.9 % IV SOLN: INTRAVENOUS | Status: DC

## 2024-04-10 MED ORDER — FENTANYL CITRATE (PF) 100 MCG/2ML IJ SOLN
INTRAMUSCULAR | Status: AC | PRN
  Administered 2024-04-10 (×2): 50 ug via INTRAVENOUS

## 2024-04-10 MED ORDER — CHLORHEXIDINE GLUCONATE CLOTH 2 % EX PADS
6.0000 | MEDICATED_PAD | Freq: Every day | CUTANEOUS | Status: DC
  Administered 2024-04-11 – 2024-04-13 (×3): 6 via TOPICAL

## 2024-04-10 MED ORDER — CEFAZOLIN SODIUM-DEXTROSE 2-4 GM/100ML-% IV SOLN: 2.0000 g | Freq: Once | INTRAVENOUS | Status: DC

## 2024-04-10 MED ORDER — FENTANYL CITRATE (PF) 100 MCG/2ML IJ SOLN
INTRAMUSCULAR | Status: AC
  Filled 2024-04-10: qty 2

## 2024-04-10 MED ORDER — MIDAZOLAM HCL 2 MG/2ML IJ SOLN
INTRAMUSCULAR | Status: AC
Start: 2024-04-10 — End: 2024-04-10
  Filled 2024-04-10: qty 2

## 2024-04-10 MED ORDER — SODIUM CHLORIDE 0.9 % IV SOLN
2.0000 g | Freq: Three times a day (TID) | INTRAVENOUS | Status: DC
  Filled 2024-04-10 (×2): qty 40

## 2024-04-10 MED ORDER — SODIUM CHLORIDE 0.9 % IV SOLN
2.0000 g | Freq: Once | INTRAVENOUS | Status: AC
  Administered 2024-04-10: 2 g via INTRAVENOUS
  Filled 2024-04-10: qty 40

## 2024-04-10 MED ORDER — CEFAZOLIN SODIUM-DEXTROSE 2-4 GM/100ML-% IV SOLN
INTRAVENOUS | Status: AC
  Filled 2024-04-10: qty 100

## 2024-04-10 MED ORDER — LACTATED RINGERS IV SOLN: INTRAVENOUS | Status: AC

## 2024-04-10 NOTE — H&P (Signed)
 History and Physical      Robert [name] GEX:[id number] DOB: 06-Sep-1946 DOA: 04/10/2024; DOS: 04/10/2024  PCP: [facility] *** Patient coming from: home ***  I have personally briefly reviewed patient's old medical records in [facility] Link  Chief Complaint: ***  HPI: Robert [name] is a 78 y.o. male with medical history significant for *** who is admitted to [facility] on 04/10/2024 with *** after presenting from home*** to [facility] ED complaining of ***.    ***       ***   ED Course:  Vital signs in the ED were notable for the following: ***  Labs were notable for the following: ***  Per my interpretation, EKG in ED demonstrated the following:  ***  Imaging in the ED, per corresponding formal radiology read, was notable for the following:  ***  While in the ED, the following were administered: ***  Subsequently, the patient was admitted  ***  ***red    Review of Systems: As per HPI otherwise 10 point review of systems negative.   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   Diabetes mellitus without complication (HCC)    Diverticulosis    Frequency of urination    GERD (gastroesophageal reflux disease)    Gross hematuria    History of acute pyelonephritis    10-13-2012   History of CVA with residual deficit    2008--  left side of body weakness and foot drop (wears leg brace and uses cane)   History of DVT of lower extremity    2008--  cva   Hypercholesteremia    Hyperlipidemia    Hyperlipidemia    Hypertension    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 2008   Left leg DVT (HCC)    Neuromuscular disorder (HCC)    Parkinsons   S/P insertion of IVC (inferior vena caval) filter    2008   Urethral stricture    Urgency of urination    Urinary retention    Weakness of left side of body     secondary to cva 2008   Wears glasses    Wears hearing aid    bilateral-- wears intermittantly    Past Surgical History:  Procedure Laterality Date   CYSTOSCOPY WITH RETROGRADE URETHROGRAM N/A 10/23/2015   Procedure: CYSTOSCOPY WITH RETROGRADE URETHROGRAM;  Surgeon: Ann Barnacle, MD;  Location: [facility];  Service: Urology;  Laterality: N/A;   CYSTOSCOPY WITH URETHRAL DILATATION N/A 10/23/2015   Procedure: CYSTOSCOPY WITH URETHRAL BALLOON DILATATION;  Surgeon: Ann Barnacle, MD;  Location: [facility];  Service: Urology;  Laterality: N/A;  BALLOON DILATION    IVC FILTER PLACEMENT ([facility] HX)  2008   LAPAROSCOPIC CHOLECYSTECTOMY SINGLE PORT N/A 01/09/2021   Procedure: LAPAROSCOPIC CHOLECYSTECTOMY WITH IOC;  Surgeon: Candyce Champagne, MD;  Location: [facility];  Service: General;  Laterality: N/A;  90 MIN   TRANSCAROTID ARTERY REVASCULARIZATION  Left 05/12/2021   Procedure: LEFT TRANSCAROTID ARTERY REVASCULARIZATION;  Surgeon: Young Hensen, MD;  Location: [facility] OR;  Service: Vascular;  Laterality: Left;    Social History:  reports that he quit smoking about 53 years ago. His smoking use included cigarettes. He started smoking about 63 years ago. He has never used smokeless tobacco. He reports that he does not drink  alcohol and does not use drugs.   Allergies  Allergen Reactions   Fluconazole Rash and Dermatitis   Propofol  Other (See Comments)    Hiccups for weeks   Lisinopril Cough   Sulfamethoxazole -Trimethoprim  Diarrhea   Fluoxetine Nausea Only and Other (See Comments)    Hallucinations, Feeling nervous, Nausea, Tachycardia also   Nystatin Rash    Family History  Problem Relation Age of Onset   Diabetes Sister    Lung cancer Brother        Post 9/11 voluntary work in [employer]   Prostate cancer Brother    Other Mother        passed away young- non medical   Alzheimer's disease Father    Healthy Son     Family history reviewed and not pertinent  ***   Prior to Admission medications   Medication Sig Start Date End Date Taking? Authorizing Provider  acetaminophen  (TYLENOL ) 500 MG tablet Take 1,000 mg by mouth every 6 (six) hours as needed for mild pain (pain score 1-3), moderate pain (pain score 4-6) or fever.    [provider]  apixaban  (ELIQUIS ) 5 MG TABS tablet Take 1 tablet (5 mg total) by mouth 2 (two) times daily. 05/27/21   Love, Renay Carota, PA-C  ascorbic acid (VITAMIN C) 250 MG tablet Take 250 mg by mouth in the morning and at bedtime.    [provider]  aspirin  EC 81 MG tablet Take 81 mg by mouth daily. Swallow whole.    [provider]  bacitracin 500 UNIT/GM ointment Apply 1 Application topically daily as needed for wound care (near catheter).    [provider]  busPIRone  (BUSPAR ) 10 MG tablet Take 5-10 mg by mouth See admin instructions. Take 10 mg by mouth in the morning and 5 mg at noon & bedtime    [provider]  carbidopa -levodopa  (SINEMET ) 25-100 MG tablet Take 2-3 tablets by mouth See admin instructions. Take 3 tablets by mouth at 7 AM & 12 Noon and 2 tablets between 5-6 PM/after the evening meal    [provider]  Cholecalciferol 50 MCG (2000 UT) TABS Take 2,000 Units by mouth daily.    [provider]  Dupilumab 300 MG/2ML SOAJ Inject 300 mg into the skin.    [provider]  HYDROCORTISONE  ACE, RECTAL, 30 MG SUPP Place 30 mg rectally at bedtime.    [provider]  melatonin 3 MG TABS tablet Take 6 mg by mouth at bedtime.    [provider]  memantine  (NAMENDA ) 10 MG tablet Take 20 mg by mouth at bedtime.    [provider]  metFORMIN  (GLUCOPHAGE ) 500 MG tablet Take 500 mg by mouth at bedtime.    [provider]  methenamine (MANDELAMINE) 1 g tablet Take 1,000 mg by mouth See admin instructions. Crush 1,000 mg and place into applesauce- take morning and bedtime    [provider]  miconazole (MICOTIN)  2 % powder Apply 1 Application topically See admin instructions. Apply in the groin area once a day as needed    [provider]  nystatin-triamcinolone ointment (MYCOLOG) Apply 1 application  topically as needed (rash/yeast). 10/26/21   [provider]  omeprazole (PRILOSEC) 20 MG capsule Take 20 mg by mouth daily before breakfast.    [provider]  oxybutynin  (DITROPAN ) 5 MG tablet Take 2.5 mg by mouth in the morning and at bedtime.    [provider]  Pimavanserin  Tartrate (NUPLAZID ) 34 MG  CAPS Take 34 mg by mouth daily.    [provider]  psyllium (METAMUCIL) 58.6 % powder See admin instructions. Mix 2 teaspoonsful of powder into 16 ounces of water and drink by mouth once a day    [provider]  rasagiline  (AZILECT ) 1 MG TABS tablet Take 1 mg by mouth daily.    [provider]  senna-docusate (SENOKOT-S) 8.6-50 MG tablet Take 2 tablets by mouth daily as needed for mild constipation or moderate constipation.    [provider]  simvastatin  (ZOCOR ) 20 MG tablet Take 10 mg by mouth at bedtime.    [provider]  tacrolimus (PROTOPIC) 0.1 % ointment Apply 1 application  topically See admin instructions. Apply to groin, armpits, and bottom 2 times a day when skin is broken out 11/06/21   [provider]  triamcinolone cream (KENALOG) 0.1 % Apply 1 Application topically daily as needed (rash).    [provider]  zinc  oxide 20 % ointment Apply 1 Application topically See admin instructions. Every time there is a diaper change Patient taking differently: Apply 1 Application topically See admin instructions. Apply to affected areas with every pad change 01/27/24   Leona Rake, MD     Objective    Physical Exam: Vitals:   04/10/24 1845 04/10/24 1900 04/10/24 2000 04/10/24 2100  BP: (!) 164/78   (!) 143/80  Pulse: (!) 114 (!) 116  (!) 107  Resp: (!) 27 (!) 26  (!) 24  Temp:   (!) 100.6 F (38.1  C)   TempSrc:   Oral   SpO2: 97% 96%  98%  Weight:      Height:        General: appears to be stated age; alert, oriented Skin: warm, dry, no rash Head:  AT/[place] Mouth:  Oral mucosa membranes appear moist, normal dentition Neck: [name]; trachea midline Heart:  RRR; did not appreciate any M/R/G Lungs: CTAB, did not appreciate any wheezes, rales, or rhonchi Abdomen: + BS; soft, ND, NT Vascular: 2+ pedal pulses b/l; 2+ radial pulses b/l Extremities: no peripheral edema, no muscle wasting Neuro: strength and sensation intact in upper and lower extremities b/l ***   *** Neuro: 5/5 strength of the proximal and distal flexors and extensors of the upper and lower extremities bilaterally; sensation intact in upper and lower extremities b/l; cranial nerves II through XII grossly intact; no pronator drift; no evidence suggestive of slurred speech, dysarthria, or facial droop; Normal muscle tone. No tremors.  *** Neuro: In the setting of the patient's current mental status and associated inability to follow instructions, unable to perform full neurologic exam at this time.  As such, assessment of strength, sensation, and cranial nerves is limited at this time. Patient noted to spontaneously move all 4 extremities. No tremors.  ***    Labs on Admission: I have personally reviewed following labs and imaging studies  CBC: Recent Labs  Lab 04/10/24 0842 04/10/24 1928  WBC 6.2 10.5  NEUTROABS  --  8.1*  HGB 14.3 14.4  HCT 45.8 44.5  MCV 93.5 91.6  PLT 317 312   Basic Metabolic Panel: Recent Labs  Lab 04/10/24 1928  NA 137  K 4.2  CL 100  CO2 26  GLUCOSE 156*  BUN 15  CREATININE 1.11  CALCIUM 9.3   GFR: Estimated Creatinine Clearance: 69.5 mL/min (by C-G formula based on SCr of 1.11 mg/dL). Liver Function Tests: No results for input(s): AST, ALT, ALKPHOS, BILITOT, PROT, ALBUMIN in the  last 168 hours. No results for input(s): LIPASE, AMYLASE in the last 168  hours. No results for input(s): AMMONIA in the last 168 hours. Coagulation Profile: Recent Labs  Lab 04/10/24 0842 04/10/24 2038  INR 1.1 1.1   Cardiac Enzymes: No results for input(s): CKTOTAL, CKMB, CKMBINDEX, TROPONINI in the last 168 hours. BNP (last 3 results) No results for input(s): PROBNP in the last 8760 hours. HbA1C: No results for input(s): HGBA1C in the last 72 hours. CBG: Recent Labs  Lab 04/10/24 0843  GLUCAP 142*   Lipid Profile: No results for input(s): CHOL, HDL, LDLCALC, TRIG, CHOLHDL, LDLDIRECT in the last 72 hours. Thyroid  Function Tests: No results for input(s): TSH, T4TOTAL, FREET4, T3FREE, THYROIDAB in the last 72 hours. Anemia Panel: No results for input(s): VITAMINB12, FOLATE, FERRITIN, TIBC, IRON, RETICCTPCT in the last 72 hours. Urine analysis:    Component Value Date/Time   COLORURINE RED (A) 04/10/2024 2038   APPEARANCEUR TURBID (A) 04/10/2024 2038   LABSPEC  04/10/2024 2038    TEST NOT REPORTED DUE TO COLOR INTERFERENCE OF URINE PIGMENT   PHURINE  04/10/2024 2038    TEST NOT REPORTED DUE TO COLOR INTERFERENCE OF URINE PIGMENT   GLUCOSEU (A) 04/10/2024 2038    TEST NOT REPORTED DUE TO COLOR INTERFERENCE OF URINE PIGMENT   GLUCOSEU 100 (A) 02/06/2019 0813   HGBUR (A) 04/10/2024 2038    TEST NOT REPORTED DUE TO COLOR INTERFERENCE OF URINE PIGMENT   BILIRUBINUR (A) 04/10/2024 2038    TEST NOT REPORTED DUE TO COLOR INTERFERENCE OF URINE PIGMENT   KETONESUR (A) 04/10/2024 2038    TEST NOT REPORTED DUE TO COLOR INTERFERENCE OF URINE PIGMENT   PROTEINUR (A) 04/10/2024 2038    TEST NOT REPORTED DUE TO COLOR INTERFERENCE OF URINE PIGMENT   UROBILINOGEN 1.0 02/06/2019 0813   NITRITE (A) 04/10/2024 2038    TEST NOT REPORTED DUE TO COLOR INTERFERENCE OF URINE PIGMENT   LEUKOCYTESUR (A) 04/10/2024 2038    TEST NOT REPORTED DUE TO COLOR INTERFERENCE OF URINE PIGMENT    Radiological Exams on  Admission: CT Renal Stone Study Result Date: 04/10/2024 CLINICAL DATA:  Hematuria. EXAM: CT ABDOMEN AND PELVIS WITHOUT CONTRAST TECHNIQUE: Multidetector CT imaging of the abdomen and pelvis was performed following the standard protocol without IV contrast. RADIATION DOSE REDUCTION: This exam was performed according to the departmental dose-optimization program which includes automated exposure control, adjustment of the mA and/or kV according to patient size and/or use of iterative reconstruction technique. COMPARISON:  September 15, 2023 FINDINGS: Lower chest: No acute abnormality. Hepatobiliary: No focal liver abnormality is seen. Status post cholecystectomy. No biliary dilatation. Pancreas: Unremarkable. No pancreatic ductal dilatation or surrounding inflammatory changes. Spleen: Normal in size without focal abnormality. Adrenals/Urinary Tract: Adrenal glands are unremarkable. Kidneys are normal, without renal calculi or focal lesions. There is mild to moderate severity bilateral hydronephrosis and hydroureter. A suprapubic catheter is seen within the urinary bladder. Mild diffuse urinary bladder wall thickening is noted. Multiple large urinary bladder diverticula are seen, some of which contain a small amount of air. A 5.6 cm x 5.9 cm x 7.0 cm area of heterogeneous increased attenuation (approximately 66.80 Hounsfield units) is seen throughout the bladder lumen. Stomach/Bowel: Stomach is within normal limits. Appendix appears normal. No evidence of bowel wall thickening, distention, or inflammatory changes. Noninflamed diverticula are seen throughout the descending and sigmoid colon. Vascular/Lymphatic: Aortic atherosclerosis. An inferior vena cava filter is in place. No enlarged abdominal or pelvic lymph nodes. Reproductive: Brachytherapy  seeds are seen within an enlarged prostate gland. Other: No abdominal wall hernia or abnormality. No abdominopelvic ascites. Musculoskeletal: Multilevel degenerative changes  are seen throughout the lumbar spine. IMPRESSION: 1. Findings consistent with a large amount of blood products within the urinary bladder lumen. Further evaluation with cystoscopy is recommended. 2. Multiple large urinary bladder diverticula, some of which contain a small amount of air. 3. Mild to moderate severity bilateral hydronephrosis and hydroureter. 4. Colonic diverticulosis. 5. Evidence of prior cholecystectomy. 6. Brachytherapy seeds within an enlarged prostate gland. Correlation with PSA levels is recommended. 7. Aortic atherosclerosis. Electronically Signed   By: Virgle Grime M.D.   On: 04/10/2024 20:55   DG Chest Port 1 View Result Date: 04/10/2024 CLINICAL DATA:  Possible sepsis. EXAM: PORTABLE CHEST 1 VIEW COMPARISON:  03/31/2024. FINDINGS: The heart is enlarged mediastinal contours within normal limits. There is atherosclerotic calcification of the aorta. The pulmonary vasculature is distended. Chronic elevation of the right diaphragm is noted with mild airspace disease at the lung bases. No effusion or pneumothorax is seen. No acute osseous abnormality. IMPRESSION: 1. Cardiomegaly with pulmonary vascular congestion. 2. Mild airspace disease at the lung bases, possible atelectasis, edema, or infiltrate. Electronically Signed   By: Wyvonnia Heimlich M.D.   On: 04/10/2024 19:56   CT GUIDED SUPERPUBIC CATHETER PLMT Result Date: 04/10/2024 INDICATION: Chronic urinary retention, prior stroke, Parkinson's disease and dependence on chronic indwelling Foley catheter. Need for suprapubic bladder drainage catheter placement. EXAM: CT GUIDED SUPRAPUBIC CATHETER PLACEMENT COMPARISON:  None Available. MEDICATIONS: 2 g IV Ancef ; The antibiotic was administered in an appropriate time frame prior to skin puncture. ANESTHESIA/SEDATION: Moderate (conscious) sedation was employed during this procedure. A total of Versed  2.0 mg and Fentanyl  100 mcg was administered intravenously. Moderate Sedation Time: 26  minutes. The patient's level of consciousness and vital signs were monitored continuously by radiology nursing throughout the procedure under my direct supervision. CONTRAST:  None FLUOROSCOPY TIME:  Procedure done under CT guidance. COMPLICATIONS: None immediate. PROCEDURE: Informed written consent was obtained from the patient's wife after a thorough discussion of the procedural risks, benefits and alternatives. All questions were addressed. Maximal Sterile Barrier Technique was utilized including caps, mask, sterile gowns, sterile gloves, sterile drape, hand hygiene and skin antiseptic. A timeout was performed prior to the initiation of the procedure. CT was performed in a supine position through the pelvis after distension of the bladder with 250 mL of sterile water. After choosing a site for tube placement in the suprapubic region, the skin was prepped with chlorhexidine  and local anesthesia provided with 1% lidocaine . An 18 gauge trocar needle was advanced under CT guidance into the bladder. After return of urine, the needle was removed over a guidewire. The percutaneous tract was dilated over the wire and a 14 French pigtail drainage catheter advanced into the bladder. Final catheter position was confirmed by CT. The drainage catheter was then attached to a gravity drainage bag. It was secured at the skin with a Prolene retention suture and adhesive StatLock device. After suprapubic catheter placement, an indwelling Foley catheter was removed. FINDINGS: After bladder distension, a suprapubic window was present under CT to the level of the bladder. A 14 French catheter was able to be placed into the bladder lumen with return of blood tinged urine after placement. IMPRESSION: Successful placement of 14 French suprapubic bladder drainage catheter under CT guidance. After placement, a chronic indwelling Foley catheter was removed. The patient will be brought back in approximately 6 weeks for  up sizing of the  suprapubic catheter under fluoroscopy to a 16 [place] Foley catheter. Electronically Signed   By: Erica Hau M.D.   On: 04/10/2024 14:43      Assessment/Plan   Principal Problem:   Gross hematuria   ***            ***                  ***                   ***                  ***                  ***                  ***                   ***                  ***                  ***                  ***                  ***                 ***                ***  DVT prophylaxis: SCD's ***  Code Status: Full code*** Family Communication: none*** Disposition Plan: Per Rounding Team Consults called: none***;  Admission status: ***     I SPENT GREATER THAN 75 *** MINUTES IN CLINICAL CARE TIME/MEDICAL DECISION-MAKING IN COMPLETING THIS ADMISSION.      Gattis Kass Chivas Notz DO [facility]  From 7PM - 7AM   04/10/2024, 10:13 PM   ***

## 2024-04-10 NOTE — Sepsis Progress Note (Signed)
 Elink monitoring for the code sepsis protocol.

## 2024-04-10 NOTE — ED Provider Notes (Signed)
 [place] EMERGENCY DEPARTMENT AT [facility] Provider Note   CSN: [id number] Arrival date & time: 04/10/24  1525     Patient presents with: Hematuria   Robert [name] is a 78 y.o. male.   HPI     78 y.o. 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 Patient presented secondary to bleeding diffusely.  Collateral history provided by patient's wife.  Wife of that, as patient has been in the hallway.  I tried to calm her down, and we have requested charge nurse to give patient medicine as available.  Patient will not be sent to the waiting room.  According the wife, patient was recently admitted to the hospital for UTI.  [facility] to put in suprapubic catheter to prevent recurrent UTI.  IR completed the procedure earlier today.  Patient went home.  After he started bleeding.  Patient is also having lower pelvic pain, and he is shivering.  Prior to Admission medications   Medication Sig Start Date End Date Taking? Authorizing Provider  acetaminophen  (TYLENOL ) 500 MG tablet Take 1,000 mg by mouth every 6 (six) hours as needed for mild pain (pain score 1-3), moderate pain (pain score 4-6) or fever.    [provider]  apixaban  (ELIQUIS ) 5 MG TABS tablet Take 1 tablet (5 mg total) by mouth 2 (two) times daily. 05/27/21   Love, Renay Carota, PA-C  ascorbic acid (VITAMIN C) 250 MG tablet Take 250 mg by mouth in the morning and at bedtime.    [provider]  aspirin  EC 81 MG tablet Take 81 mg by mouth daily. Swallow whole.    [provider]  bacitracin 500 UNIT/GM ointment Apply 1 Application topically daily as needed for wound care (near catheter).    [provider]  busPIRone  (BUSPAR ) 10 MG tablet Take 5-10 mg by mouth See admin instructions. Take 10 mg by mouth in the morning and 5 mg at noon  & bedtime    [provider]  carbidopa -levodopa  (SINEMET ) 25-100 MG tablet Take 2-3 tablets by mouth See admin instructions. Take 3 tablets by mouth at 7 AM & 12 Noon and 2 tablets between 5-6 PM/after the evening meal    [provider]  Cholecalciferol 50 MCG (2000 UT) TABS Take 2,000 Units by mouth daily.    [provider]  Dupilumab 300 MG/2ML SOAJ Inject 300 mg into the skin.    [provider]  HYDROCORTISONE  ACE, RECTAL, 30 MG SUPP Place 30 mg rectally at bedtime.    [provider]  melatonin 3 MG TABS tablet Take 6 mg by mouth at bedtime.    [provider]  memantine  (NAMENDA ) 10 MG tablet Take 20 mg by mouth at bedtime.    [provider]  metFORMIN  (GLUCOPHAGE ) 500 MG tablet Take 500 mg by mouth at bedtime.    [provider]  methenamine (MANDELAMINE) 1 g tablet Take 1,000 mg by mouth See admin instructions. Crush 1,000 mg and place into applesauce- take morning and bedtime    [provider]  miconazole (MICOTIN) 2 % powder Apply 1 Application topically See admin instructions. Apply in the groin area once a day as needed    [provider]  nystatin-triamcinolone ointment (MYCOLOG) Apply 1 application  topically as needed (rash/yeast). 10/26/21   [provider]  omeprazole (PRILOSEC) 20 MG capsule Take 20 mg by mouth daily before breakfast.    [provider]  oxybutynin  (DITROPAN ) 5 MG tablet Take 2.5 mg by mouth in the morning and at bedtime.    [provider]  Pimavanserin  Tartrate (NUPLAZID ) 34 MG CAPS Take 34 mg by mouth daily.    [provider]  psyllium (METAMUCIL) 58.6 % powder See admin instructions. Mix 2 teaspoonsful of powder into 16 ounces of water and drink by mouth once a day    [provider]  rasagiline  (AZILECT ) 1 MG TABS tablet Take 1 mg by mouth daily.    [provider]  senna-docusate (SENOKOT-S) 8.6-50 MG tablet Take 2  tablets by mouth daily as needed for mild constipation or moderate constipation.    [provider]  simvastatin  (ZOCOR ) 20 MG tablet Take 10 mg by mouth at bedtime.    [provider]  tacrolimus (PROTOPIC) 0.1 % ointment Apply 1 application  topically See admin instructions. Apply to groin, armpits, and bottom 2 times a day when skin is broken out 11/06/21   [provider]  triamcinolone cream (KENALOG) 0.1 % Apply 1 Application topically daily as needed (rash).    [provider]  zinc  oxide 20 % ointment Apply 1 Application topically See admin instructions. Every time there is a diaper change Patient taking differently: Apply 1 Application topically See admin instructions. Apply to affected areas with every pad change 01/27/24   Leona Rake, MD    Allergies: Fluconazole, Propofol , Lisinopril, Sulfamethoxazole -trimethoprim , Fluoxetine, and Nystatin    Review of Systems  All other systems reviewed and are negative.   Updated Vital Signs BP (!) 143/80   Pulse (!) 107   Temp (!) 100.6 F (38.1 C) (Oral)   Resp (!) 24   Ht 6' 4 (1.93 m)   Wt 99.8 kg   SpO2 98%   BMI 26.78 kg/m   Physical Exam Vitals and nursing note reviewed.  Constitutional:      General: He is in acute distress.     Appearance: He is well-developed.  HENT:     Head: Atraumatic.   Cardiovascular:     Rate and Rhythm: Normal rate.  Pulmonary:     Effort: Pulmonary effort is normal.  Abdominal:     Tenderness: There is abdominal tenderness.  Genitourinary:    Comments: Patient has suprapubic catheter in place.  There is gross hematuria in the bag associated with the suprapubic catheter.  Patient also has bleeding over the urethra  Musculoskeletal:     Cervical back: Neck supple.   Skin:    General: Skin is warm.   Neurological:     Mental Status: He is alert and oriented to person, place, and time.     (all labs ordered are listed, but only abnormal results  are displayed) Labs Reviewed  BASIC METABOLIC PANEL WITH GFR - Abnormal; Notable for the following components:      Result Value   Glucose, Bld 156 (*)    All other components within normal limits  CBC WITH DIFFERENTIAL/PLATELET - Abnormal; Notable for the following components:   Neutro Abs 8.1 (*)    Monocytes Absolute 1.1 (*)    All other components within normal limits  URINALYSIS, W/ REFLEX TO CULTURE (INFECTION SUSPECTED) - Abnormal; Notable for the following components:   Color, Urine RED (*)    APPearance TURBID (*)    Glucose, UA   (*)    Value: TEST  NOT REPORTED DUE TO COLOR INTERFERENCE OF URINE PIGMENT   Hgb urine dipstick   (*)    Value: TEST NOT REPORTED DUE TO COLOR INTERFERENCE OF URINE PIGMENT   Bilirubin Urine   (*)    Value: TEST NOT REPORTED DUE TO COLOR INTERFERENCE OF URINE PIGMENT   Ketones, ur   (*)    Value: TEST NOT REPORTED DUE TO COLOR INTERFERENCE OF URINE PIGMENT   Protein, ur   (*)    Value: TEST NOT REPORTED DUE TO COLOR INTERFERENCE OF URINE PIGMENT   Nitrite   (*)    Value: TEST NOT REPORTED DUE TO COLOR INTERFERENCE OF URINE PIGMENT   Leukocytes,Ua   (*)    Value: TEST NOT REPORTED DUE TO COLOR INTERFERENCE OF URINE PIGMENT   Bacteria, UA RARE (*)    All other components within normal limits  RESP PANEL BY RT-PCR (RSV, FLU A&B, COVID)  [id number]  CULTURE, BLOOD (ROUTINE X 2)  CULTURE, BLOOD (ROUTINE X 2)  CULTURE, OB URINE  PROTIME-INR  MAGNESIUM   CBC WITH DIFFERENTIAL/PLATELET  COMPREHENSIVE METABOLIC PANEL WITH GFR  MAGNESIUM   HEMOGLOBIN AND HEMATOCRIT, BLOOD  HEMOGLOBIN AND HEMATOCRIT, BLOOD  I-STAT CG4 LACTIC ACID, ED  TYPE AND SCREEN    EKG: None  Radiology: CT Renal Stone Study Result Date: 04/10/2024 CLINICAL DATA:  Hematuria. EXAM: CT ABDOMEN AND PELVIS WITHOUT CONTRAST TECHNIQUE: Multidetector CT imaging of the abdomen and pelvis was performed following the standard protocol without IV contrast. RADIATION DOSE REDUCTION: This  exam was performed according to the departmental dose-optimization program which includes automated exposure control, adjustment of the mA and/or kV according to patient size and/or use of iterative reconstruction technique. COMPARISON:  September 15, 2023 FINDINGS: Lower chest: No acute abnormality. Hepatobiliary: No focal liver abnormality is seen. Status post cholecystectomy. No biliary dilatation. Pancreas: Unremarkable. No pancreatic ductal dilatation or surrounding inflammatory changes. Spleen: Normal in size without focal abnormality. Adrenals/Urinary Tract: Adrenal glands are unremarkable. Kidneys are normal, without renal calculi or focal lesions. There is mild to moderate severity bilateral hydronephrosis and hydroureter. A suprapubic catheter is seen within the urinary bladder. Mild diffuse urinary bladder wall thickening is noted. Multiple large urinary bladder diverticula are seen, some of which contain a small amount of air. A 5.6 cm x 5.9 cm x 7.0 cm area of heterogeneous increased attenuation (approximately 66.80 Hounsfield units) is seen throughout the bladder lumen. Stomach/Bowel: Stomach is within normal limits. Appendix appears normal. No evidence of bowel wall thickening, distention, or inflammatory changes. Noninflamed diverticula are seen throughout the descending and sigmoid colon. Vascular/Lymphatic: Aortic atherosclerosis. An inferior vena cava filter is in place. No enlarged abdominal or pelvic lymph nodes. Reproductive: Brachytherapy seeds are seen within an enlarged prostate gland. Other: No abdominal wall hernia or abnormality. No abdominopelvic ascites. Musculoskeletal: Multilevel degenerative changes are seen throughout the lumbar spine. IMPRESSION: 1. Findings consistent with a large amount of blood products within the urinary bladder lumen. Further evaluation with cystoscopy is recommended. 2. Multiple large urinary bladder diverticula, some of which contain a small amount of air. 3.  Mild to moderate severity bilateral hydronephrosis and hydroureter. 4. Colonic diverticulosis. 5. Evidence of prior cholecystectomy. 6. Brachytherapy seeds within an enlarged prostate gland. Correlation with PSA levels is recommended. 7. Aortic atherosclerosis. Electronically Signed   By: Virgle Grime M.D.   On: 04/10/2024 20:55   DG Chest Port 1 View Result Date: 04/10/2024 CLINICAL DATA:  Possible sepsis. EXAM: PORTABLE CHEST 1 VIEW COMPARISON:  03/31/2024. FINDINGS:  The heart is enlarged mediastinal contours within normal limits. There is atherosclerotic calcification of the aorta. The pulmonary vasculature is distended. Chronic elevation of the right diaphragm is noted with mild airspace disease at the lung bases. No effusion or pneumothorax is seen. No acute osseous abnormality. IMPRESSION: 1. Cardiomegaly with pulmonary vascular congestion. 2. Mild airspace disease at the lung bases, possible atelectasis, edema, or infiltrate. Electronically Signed   By: Wyvonnia Heimlich M.D.   On: 04/10/2024 19:56   CT GUIDED SUPERPUBIC CATHETER PLMT Result Date: 04/10/2024 INDICATION: Chronic urinary retention, prior stroke, Parkinson's disease and dependence on chronic indwelling Foley catheter. Need for suprapubic bladder drainage catheter placement. EXAM: CT GUIDED SUPRAPUBIC CATHETER PLACEMENT COMPARISON:  None Available. MEDICATIONS: 2 g IV Ancef ; The antibiotic was administered in an appropriate time frame prior to skin puncture. ANESTHESIA/SEDATION: Moderate (conscious) sedation was employed during this procedure. A total of Versed  2.0 mg and Fentanyl  100 mcg was administered intravenously. Moderate Sedation Time: 26 minutes. The patient's level of consciousness and vital signs were monitored continuously by radiology nursing throughout the procedure under my direct supervision. CONTRAST:  None FLUOROSCOPY TIME:  Procedure done under CT guidance. COMPLICATIONS: None immediate. PROCEDURE: Informed written  consent was obtained from the patient's wife after a thorough discussion of the procedural risks, benefits and alternatives. All questions were addressed. Maximal Sterile Barrier Technique was utilized including caps, mask, sterile gowns, sterile gloves, sterile drape, hand hygiene and skin antiseptic. A timeout was performed prior to the initiation of the procedure. CT was performed in a supine position through the pelvis after distension of the bladder with 250 mL of sterile water. After choosing a site for tube placement in the suprapubic region, the skin was prepped with chlorhexidine  and local anesthesia provided with 1% lidocaine . An 18 gauge trocar needle was advanced under CT guidance into the bladder. After return of urine, the needle was removed over a guidewire. The percutaneous tract was dilated over the wire and a 14 French pigtail drainage catheter advanced into the bladder. Final catheter position was confirmed by CT. The drainage catheter was then attached to a gravity drainage bag. It was secured at the skin with a Prolene retention suture and adhesive StatLock device. After suprapubic catheter placement, an indwelling Foley catheter was removed. FINDINGS: After bladder distension, a suprapubic window was present under CT to the level of the bladder. A 14 French catheter was able to be placed into the bladder lumen with return of blood tinged urine after placement. IMPRESSION: Successful placement of 14 French suprapubic bladder drainage catheter under CT guidance. After placement, a chronic indwelling Foley catheter was removed. The patient will be brought back in approximately 6 weeks for up sizing of the suprapubic catheter under fluoroscopy to a 16 [place] Foley catheter. Electronically Signed   By: Erica Hau M.D.   On: 04/10/2024 14:43     Procedures   Medications Ordered in the ED  lactated ringers  infusion ( Intravenous New Bag/Given 04/10/24 2034)  acetaminophen  (TYLENOL )  tablet 650 mg (has no administration in time range)    Or  acetaminophen  (TYLENOL ) suppository 650 mg (has no administration in time range)  melatonin tablet 3 mg (has no administration in time range)  ondansetron  (ZOFRAN ) injection 4 mg (has no administration in time range)  meropenem  (MERREM ) 1 g in sodium chloride  0.9 % 100 mL IVPB (has no administration in time range)  fentaNYL  (SUBLIMAZE ) injection 50 mcg (50 mcg Intravenous Given 04/10/24 1935)  meropenem  (MERREM ) 2  g in sodium chloride  0.9 % 100 mL IVPB (0 g Intravenous Stopped 04/10/24 2105)  acetaminophen  (TYLENOL ) tablet 650 mg (650 mg Oral Given 04/10/24 2112)                                    Medical Decision Making Amount and/or Complexity of Data Reviewed Labs: ordered. Radiology: ordered.  Risk OTC drugs. Prescription drug management. Decision regarding hospitalization.   78 y.o. 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  Collateral history provided by patient's wife.  I also reviewed patient's recent discharge summary.  Differential diagnosis for this patient includes traumatic Foley catheter removal, trauma from suprapubic catheter placement, hemorrhagic cystitis.  Pain likely because of spasms.  Initial plan was to get basic labs, CT renal stone.  Reassessment: Patient normally tachycardic, but now developing fevers.  Meropenem  given.  Sepsis workup initiated. Fever could be reactive to acute stress or could be central in nature, but patient has significant risk factors and high risk for decompensation that we cannot ignore.  Meropenem  given.  I have independently interpreted CT scan.  Patient has bilateral hydronephrosis. There is also clots per CT.  We will put irrigation Foley  catheter. We will admit the patient to the hospital.  Wife updated.  10:41 PM Dr. Inga Manges, urology recommends 20 French Foley catheter and not doing continuous irrigation due to the recent placement of suprapubic catheter.  Patient will need suctioning of the clot.  Nursing staff made aware.  Final diagnoses:  Gross hematuria  SIRS (systemic inflammatory response syndrome) (HCC)  Hydronephrosis, unspecified hydronephrosis type    ED Discharge Orders     None          Deatra Face, MD 04/10/24 2242

## 2024-04-10 NOTE — Progress Notes (Signed)
 Patient and wife was given discharge instructions. Both verbalized understanding.

## 2024-04-10 NOTE — Procedures (Signed)
 Interventional Radiology Procedure Note  Procedure: CT guided suprapubic catheter placement  Complications: None  Estimated Blood Loss: < 10 mL  Findings: 14 Fr pigtail SPT placed under CT guidance into bladder. Attached to new gravity leg bag. Foley catheter removed after SPT placement.  Nonda Bays. Nereida Banning, M.D Pager:  [phone]

## 2024-04-10 NOTE — H&P (Addendum)
 Chief Complaint: Patient was seen in consultation today for urinary retention- supra pubic catheter placement at the request of O'Berry,Kamna D  Referring Physician(s): Diane.Demark D  Supervising Physician: Erica Hau  Patient Status: [facility] - Out-pt  History of Present Illness: Kishaun Erekson is a 78 y.o. 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   Diabetes mellitus without complication (HCC)    Diverticulosis    Frequency of urination    GERD (gastroesophageal reflux disease)    Gross hematuria    History of acute pyelonephritis    10-13-2012   History of CVA with residual deficit    2008--  left side of body weakness and foot drop (wears leg brace and uses cane)   History of DVT of lower extremity    2008--  cva   Hypercholesteremia    Hyperlipidemia    Hyperlipidemia    Hypertension    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 2008   Left leg DVT (HCC)    Neuromuscular disorder (HCC)    Parkinsons   S/P insertion of IVC (inferior vena caval) filter    2008   Urethral stricture    Urgency of urination    Urinary retention    Weakness of left side of body    secondary to cva 2008   Wears glasses    Wears hearing aid    bilateral-- wears intermittantly    Past Surgical History:  Procedure Laterality Date   CYSTOSCOPY WITH RETROGRADE URETHROGRAM N/A 10/23/2015   Procedure: CYSTOSCOPY WITH RETROGRADE URETHROGRAM;  Surgeon: Ann Barnacle, MD;  Location: [facility];  Service: Urology;  Laterality: N/A;    CYSTOSCOPY WITH URETHRAL DILATATION N/A 10/23/2015   Procedure: CYSTOSCOPY WITH URETHRAL BALLOON DILATATION;  Surgeon: Ann Barnacle, MD;  Location: [facility];  Service: Urology;  Laterality: N/A;  BALLOON DILATION    IVC FILTER PLACEMENT ([facility] HX)  2008   LAPAROSCOPIC CHOLECYSTECTOMY SINGLE PORT N/A 01/09/2021   Procedure: LAPAROSCOPIC CHOLECYSTECTOMY WITH IOC;  Surgeon: Candyce Champagne, MD;  Location: [facility];  Service: General;  Laterality: N/A;  90 MIN   TRANSCAROTID ARTERY REVASCULARIZATION  Left 05/12/2021   Procedure: LEFT TRANSCAROTID ARTERY REVASCULARIZATION;  Surgeon: Young Hensen, MD;  Location: [facility];  Service: Vascular;  Laterality: Left;    Allergies: Fluconazole, Propofol , Lisinopril, Sulfamethoxazole -trimethoprim , Fluoxetine, and Nystatin  Medications: Prior to Admission medications   Medication Sig Start Date End Date Taking? Authorizing Provider  acetaminophen  (TYLENOL ) 500 MG tablet Take 1,000 mg by mouth every 6 (six) hours as needed for mild pain (pain score 1-3), moderate pain (pain score 4-6) or fever.    [provider]  apixaban  (ELIQUIS ) 5 MG TABS tablet Take 1 tablet (5 mg total) by mouth 2 (two) times daily. 05/27/21   Love, Renay Carota, PA-C  ascorbic acid (VITAMIN C) 250 MG tablet Take 250 mg by mouth in the morning and at bedtime.    [provider]  aspirin  EC 81 MG tablet Take 81 mg by mouth  daily. Swallow whole.    [provider]  bacitracin 500 UNIT/GM ointment Apply 1 Application topically daily as needed for wound care (near catheter).    [provider]  busPIRone  (BUSPAR ) 10 MG tablet Take 5-10 mg by mouth See admin instructions. Take 10 mg by mouth in the morning and 5 mg at noon & bedtime    [provider]  carbidopa -levodopa  (SINEMET ) 25-100 MG tablet Take 2-3 tablets by mouth See admin instructions. Take 3 tablets by mouth at 7 AM & 12 Noon and 2 tablets between 5-6 PM/after the evening meal     [provider]  Cholecalciferol 50 MCG (2000 UT) TABS Take 2,000 Units by mouth daily.    [provider]  Dupilumab 300 MG/2ML SOAJ Inject 300 mg into the skin.    [provider]  HYDROCORTISONE  ACE, RECTAL, 30 MG SUPP Place 30 mg rectally at bedtime.    [provider]  melatonin 3 MG TABS tablet Take 6 mg by mouth at bedtime.    [provider]  memantine  (NAMENDA ) 10 MG tablet Take 20 mg by mouth at bedtime.    [provider]  metFORMIN  (GLUCOPHAGE ) 500 MG tablet Take 500 mg by mouth at bedtime.    [provider]  methenamine (MANDELAMINE) 1 g tablet Take 1,000 mg by mouth See admin instructions. Crush 1,000 mg and place into applesauce- take morning and bedtime    [provider]  miconazole (MICOTIN) 2 % powder Apply 1 Application topically See admin instructions. Apply in the groin area once a day as needed    [provider]  nystatin-triamcinolone ointment (MYCOLOG) Apply 1 application  topically as needed (rash/yeast). 10/26/21   [provider]  omeprazole (PRILOSEC) 20 MG capsule Take 20 mg by mouth daily before breakfast.    [provider]  oxybutynin  (DITROPAN ) 5 MG tablet Take 2.5 mg by mouth in the morning and at bedtime.    [provider]  Pimavanserin  Tartrate (NUPLAZID ) 34 MG CAPS Take 34 mg by mouth daily.    [provider]  psyllium (METAMUCIL) 58.6 % powder See admin instructions. Mix 2 teaspoonsful of powder into 16 ounces of water and drink by mouth once a day    [provider]  rasagiline  (AZILECT ) 1 MG TABS tablet Take 1 mg by mouth daily.    [provider]  senna-docusate (SENOKOT-S) 8.6-50 MG tablet Take 2 tablets by mouth daily as needed for mild constipation or moderate constipation.    [provider]  simvastatin  (ZOCOR ) 20 MG tablet Take 10 mg by mouth at bedtime.    [provider]  tacrolimus (PROTOPIC)  0.1 % ointment Apply 1 application  topically See admin instructions. Apply to groin, armpits, and bottom 2 times a day when skin is broken out 11/06/21   [provider]  triamcinolone cream (KENALOG) 0.1 % Apply 1 Application topically daily as needed (rash).    [provider]  zinc  oxide 20 % ointment Apply 1 Application topically See admin instructions. Every time there is a diaper change Patient taking differently: Apply 1 Application topically See admin instructions. Apply to affected areas with every pad change 01/27/24   Leona Rake, MD     Family History  Problem Relation Age of Onset   Diabetes Sister    Lung cancer Brother        Post 9/11 voluntary work in [employer]   Prostate cancer Brother    Other  Mother        passed away young- non medical   Alzheimer's disease Father    Healthy Son     Social History   Socioeconomic History   Marital status: Married    Spouse name: [name]   Number of children: 1   Years of education: 12   Highest education level: High school graduate  Occupational History   Occupation: retired    Comment: Art therapist  Tobacco Use   Smoking status: Former    Current packs/day: 0.00    Types: Cigarettes    Start date: 10/26/1960    Quit date: 10/26/1970    Years since quitting: 53.4   Smokeless tobacco: Never  Vaping Use   Vaping status: Never Used  Substance and Sexual Activity   Alcohol use: No   Drug use: No   Sexual activity: Not on file  Other Topics Concern   Not on file  Social History Narrative   Lives in [place] with wife. He is a Cytogeneticist.   Has one son who lives in [place] . He is healthy.   [facility] [place] [place].      Patient is right-handed. He lives with his wife in a one level home.   Social Drivers of Health   Financial Resource Strain: Medium Risk (02/20/2020)   Overall Financial Resource Strain (CARDIA)    Difficulty of Paying Living Expenses: Somewhat hard  Food  Insecurity: No Food Insecurity (03/31/2024)   Hunger Vital Sign    Worried About Running Out of Food in the Last Year: Never true    Ran Out of Food in the Last Year: Never true  Transportation Needs: No Transportation Needs (03/31/2024)   PRAPARE - Administrator, Civil Service (Medical): No    Lack of Transportation (Non-Medical): No  Recent Concern: Transportation Needs - Unmet Transportation Needs (01/22/2024)   PRAPARE - Administrator, Civil Service (Medical): No    Lack of Transportation (Non-Medical): Yes  Physical Activity: Not on file  Stress: Not on file  Social Connections: Socially Isolated (03/31/2024)   Social Connection and Isolation Panel    Frequency of Communication with Friends and Family: Never    Frequency of Social Gatherings with Friends and Family: Never    Attends Religious Services: Never    Database administrator or Organizations: No    Attends Engineer, structural: Never    Marital Status: Married    Review of Systems: A 12 point ROS discussed and pertinent positives are indicated in the HPI above.  All other systems are negative.  Vital Signs: BP 130/78   Pulse 84   Temp 98.3 F (36.8 C) (Oral)   Resp 16   Ht 6' 4 (1.93 m)   Wt 220 lb (99.8 kg)   SpO2 97%   BMI 26.78 kg/m   Advance Care Plan: The advanced care plan/surrogate decision maker was discussed at the time of visit and documented in the medical record.    Physical Exam Vitals reviewed.  HENT:     Mouth/Throat:     Mouth: Mucous membranes are moist.   Cardiovascular:     Rate and Rhythm: Normal rate and regular rhythm.     Heart sounds: Normal heart sounds.  Pulmonary:     Effort: Pulmonary effort is normal.     Breath sounds: No wheezing.  Abdominal:     Palpations: Abdomen is soft.   Skin:  General: Skin is warm.   Neurological:     Mental Status: He is alert. Mental status is at baseline.   Psychiatric:     Comments: Wife at bedside She  consents to procedure     Imaging: CT Head Wo Contrast Result Date: 03/31/2024 CLINICAL DATA:  Mental status change, unknown cause EXAM: CT HEAD WITHOUT CONTRAST TECHNIQUE: Contiguous axial images were obtained from the base of the skull through the vertex without intravenous contrast. RADIATION DOSE REDUCTION: This exam was performed according to the departmental dose-optimization program which includes automated exposure control, adjustment of the mA and/or kV according to patient size and/or use of iterative reconstruction technique. COMPARISON:  05/12/2021 FINDINGS: Brain: Large area of encephalomalacia involving the right frontal lobe is stable since prior study. There is atrophy and chronic small vessel disease changes. No acute intracranial abnormality. Specifically, no hemorrhage, hydrocephalus, mass lesion, acute infarction, or significant intracranial injury. Vascular: No hyperdense vessel or unexpected calcification. Skull: No acute calvarial abnormality. Sinuses/Orbits: No acute findings Other: None IMPRESSION: Atrophy, chronic microvascular disease. No acute intracranial abnormality. Stable large area of encephalomalacia in the right frontal lobe. Electronically Signed   By: Janeece Mechanic M.D.   On: 03/31/2024 17:20   DG Chest 1 View Result Date: 03/31/2024 CLINICAL DATA:  Urinary tract infection. EXAM: CHEST  1 VIEW COMPARISON:  01/22/2024 FINDINGS: The heart remains near the upper limit of normal in size. Tortuous and partially calcified thoracic aorta. Stable linear scarring at the left lung base. Otherwise, clear lungs. Stable prominent upper lung zone pulmonary vasculature. No pleural fluid. Bilateral shoulder degenerative changes with superior migration of the humeral heads is again demonstrated. IMPRESSION: 1. No acute abnormality. 2. Stable borderline cardiomegaly and mild pulmonary vascular congestion. 3. Stable bilateral shoulder degenerative changes with evidence of chronic bilateral  rotator cuff tears. Electronically Signed   By: Catherin Closs M.D.   On: 03/31/2024 15:34   VAS US  CAROTID Result Date: 03/21/2024 Carotid Arterial Duplex Study Patient Name:  Tarvis Blossom  Date of Exam:   03/21/2024 Medical Rec #: [id number]       Accession #:    [id number] Date of Birth: 1946-10-16       Patient Gender: M Patient Age:   48 years Exam Location:  [name] Procedure:      VAS US  CAROTID Referring Phys: Jimmye Moulds --------------------------------------------------------------------------------  Indications:       Carotid artery disease. Risk Factors:      Hypertension, hyperlipidemia, prior CVA. Other Factors:     05/12/21: Left TCAR. Known right ICA occlusion. Limitations        Today's exam was limited due to Exam performed in WC. Comparison Study:  No change since prior exam of 03/16/2023 Performing Technologist: Homer Lust RVT  Examination Guidelines: A complete evaluation includes B-mode imaging, spectral Doppler, color Doppler, and power Doppler as needed of all accessible portions of each vessel. Bilateral testing is considered an integral part of a complete examination. Limited examinations for reoccurring indications may be performed as noted.  Right Carotid Findings: +----------+--------+--------+--------+------------------+--------+           PSV cm/sEDV cm/sStenosisPlaque DescriptionComments +----------+--------+--------+--------+------------------+--------+ CCA Prox  65      0                                          +----------+--------+--------+--------+------------------+--------+ CCA Mid   83  5                                          +----------+--------+--------+--------+------------------+--------+ CCA Distal46      4                                          +----------+--------+--------+--------+------------------+--------+ ICA Prox                  Occluded                            +----------+--------+--------+--------+------------------+--------+ ICA Mid   96      96      Occluded                           +----------+--------+--------+--------+------------------+--------+ ICA Distal                Occluded                           +----------+--------+--------+--------+------------------+--------+ ECA       101     0                                          +----------+--------+--------+--------+------------------+--------+ +----------+--------+-------+----------------+-------------------+           PSV cm/sEDV cmsDescribe        Arm Pressure (mmHG) +----------+--------+-------+----------------+-------------------+ Subclavian122     2      Multiphasic, [id number]                 +----------+--------+-------+----------------+-------------------+ +---------+--------+--+--------+-+---------+ VertebralPSV cm/s34EDV cm/s9Antegrade +---------+--------+--+--------+-+---------+  Left Carotid Findings: +----------+--------+--------+--------+------------------+--------+           PSV cm/sEDV cm/sStenosisPlaque DescriptionComments +----------+--------+--------+--------+------------------+--------+ CCA Prox  135     16                                         +----------+--------+--------+--------+------------------+--------+ CCA Mid   93      13                                         +----------+--------+--------+--------+------------------+--------+ CCA Distal74      14                                         +----------+--------+--------+--------+------------------+--------+ ICA Prox                                            stent    +----------+--------+--------+--------+------------------+--------+ ICA Mid  stent    +----------+--------+--------+--------+------------------+--------+ ICA Distal79      18                                          +----------+--------+--------+--------+------------------+--------+ ECA       74      1                                          +----------+--------+--------+--------+------------------+--------+ +----------+--------+--------+----------------+-------------------+           PSV cm/sEDV cm/sDescribe        Arm Pressure (mmHG) +----------+--------+--------+----------------+-------------------+ Subclavian                Multiphasic, [id number]                 +----------+--------+--------+----------------+-------------------+ +---------+--------+--+--------+--+---------+ VertebralPSV cm/s51EDV cm/s12Antegrade +---------+--------+--+--------+--+---------+  Left Stent(s): +---------------+--+--++++ Prox to Stent  2130 +---------------+--+--++++ Proximal Stent 9317 +---------------+--+--++++ Mid Stent      8020 +---------------+--+--++++ Distal Stent   8321 +---------------+--+--++++ Distal to Stent8022 +---------------+--+--++++    Summary: Right Carotid: Evidence consistent with a total occlusion of the right ICA. Left Carotid: Patent stent with no evidence for restenosis. Vertebrals:  Bilateral vertebral arteries demonstrate antegrade flow. Subclavians: Normal flow hemodynamics were seen in bilateral subclavian              arteries. *See table(s) above for measurements and observations.  Electronically signed by Jimmye Moulds MD on 03/21/2024 at 11:10:19 AM.    Final     Labs:  CBC: Recent Labs    01/23/24 0555 01/24/24 0441 03/31/24 1545 04/01/24 0542  WBC 6.4 6.0 6.3 6.8  HGB 13.8 13.7 14.0 13.5  HCT 43.4 43.8 43.6 43.4  PLT 299 279 271 251    COAGS: Recent Labs    09/15/23 1208  INR 1.3*  APTT 31    BMP: Recent Labs    01/23/24 0555 01/24/24 0441 03/31/24 1545 04/01/24 0542  NA 140 138 138 139  K 3.9 4.0 3.8 3.8  CL 107 106 102 107  CO2 25 25 27 25   GLUCOSE 125* 148* 139* 133*  BUN 13 17 15 13   CALCIUM 8.8* 8.7* 8.9  8.9  CREATININE 1.03 0.99 1.00 0.84  GFRNONAA >60 >60 >60 >60    LIVER FUNCTION TESTS: Recent Labs    01/22/24 1332 01/24/24 0441 03/31/24 1545 04/01/24 0542  BILITOT 0.5 0.6 1.0 0.9  AST 17 14* 8* 6*  ALT 13 7 11 7   ALKPHOS 68 58 73 62  PROT 7.2 6.2* 7.1 6.0*  ALBUMIN 3.5 3.0* 3.5 3.0*    TUMOR MARKERS: No results for input(s): AFPTM, CEA, CA199, CHROMGRNA in the last 8760 hours.  Assessment and Plan:  Scheduled for supra pubic catheter placement in IR today Risks and benefits discussed with the patient and wife at bedside including bleeding, infection, damage to adjacent structures, and sepsis.  All questions were answered, patient and wife are agreeable to proceed. Consent signed and in chart.  Thank you for this interesting consult.  I greatly enjoyed meeting Theodis Kinsel and look forward to participating in their care.  A copy of this report was sent to the requesting provider on this date.  Electronically Signed: Ellen Guppy, PA-C 04/10/2024, 8:50 AM   I spent a total of  30 Minutes   in face to face in clinical consult: greater than 50% of which was counseling/coordinating care for supra pubic catheter placement

## 2024-04-10 NOTE — ED Notes (Signed)
 20 fr Coude foley catheter placed and irrigated with sterile water. Pt tolerated procedure well. Bladder irrigate with approx 200 mL. Color light pink/red at end of irrigation.

## 2024-04-10 NOTE — ED Triage Notes (Addendum)
 Pt was dc 2 hours ago, pt noticed bleeding in suprapubic catheter bag. C/O lower abd pain. Axox4. Hx of stroke and parkinson's

## 2024-04-11 ENCOUNTER — Encounter (HOSPITAL_COMMUNITY): Payer: Self-pay | Admitting: Internal Medicine

## 2024-04-11 DIAGNOSIS — I5032 Chronic diastolic (congestive) heart failure: Secondary | ICD-10-CM | POA: Diagnosis not present

## 2024-04-11 DIAGNOSIS — E119 Type 2 diabetes mellitus without complications: Secondary | ICD-10-CM | POA: Diagnosis not present

## 2024-04-11 DIAGNOSIS — R310 Gross hematuria: Secondary | ICD-10-CM | POA: Diagnosis not present

## 2024-04-11 DIAGNOSIS — E782 Mixed hyperlipidemia: Secondary | ICD-10-CM | POA: Diagnosis not present

## 2024-04-11 DIAGNOSIS — R6510 Systemic inflammatory response syndrome (SIRS) of non-infectious origin without acute organ dysfunction: Secondary | ICD-10-CM | POA: Diagnosis present

## 2024-04-11 LAB — PROCALCITONIN: Procalcitonin: <0.1 ng/mL

## 2024-04-11 LAB — CBC WITH DIFFERENTIAL/PLATELET
Abs Immature Granulocytes: 0.03 10*3/uL (ref 0.00–0.07)
Basophils Absolute: 0.1 10*3/uL (ref 0.0–0.1)
Basophils Relative: 1 %
Eosinophils Absolute: 0.1 10*3/uL (ref 0.0–0.5)
Eosinophils Relative: 1 %
HCT: 41.9 % (ref 39.0–52.0)
Hemoglobin: 13.6 g/dL (ref 13.0–17.0)
Immature Granulocytes: 0 %
Lymphocytes Relative: 13 %
Lymphs Abs: 1.4 10*3/uL (ref 0.7–4.0)
MCH: 29.5 pg (ref 26.0–34.0)
MCHC: 32.5 g/dL (ref 30.0–36.0)
MCV: 90.9 fL (ref 80.0–100.0)
Monocytes Absolute: 1.5 10*3/uL — ABNORMAL HIGH (ref 0.1–1.0)
Monocytes Relative: 14 %
Neutro Abs: 7.5 10*3/uL (ref 1.7–7.7)
Neutrophils Relative %: 71 %
Platelets: 330 10*3/uL (ref 150–400)
RBC: 4.61 MIL/uL (ref 4.22–5.81)
RDW: 13.2 % (ref 11.5–15.5)
WBC: 10.6 10*3/uL — ABNORMAL HIGH (ref 4.0–10.5)
nRBC: 0 % (ref 0.0–0.2)

## 2024-04-11 LAB — COMPREHENSIVE METABOLIC PANEL WITH GFR
ALT: 10 U/L (ref 0–44)
AST: 11 U/L — ABNORMAL LOW (ref 15–41)
Albumin: 2.9 g/dL — ABNORMAL LOW (ref 3.5–5.0)
Alkaline Phosphatase: 62 U/L (ref 38–126)
Anion gap: 9 (ref 5–15)
BUN: 15 mg/dL (ref 8–23)
CO2: 24 mmol/L (ref 22–32)
Calcium: 8.6 mg/dL — ABNORMAL LOW (ref 8.9–10.3)
Chloride: 104 mmol/L (ref 98–111)
Creatinine, Ser: 1.02 mg/dL (ref 0.61–1.24)
GFR, Estimated: >60 mL/min (ref >60)
Glucose, Bld: 147 mg/dL — ABNORMAL HIGH (ref 70–99)
Potassium: 3.9 mmol/L (ref 3.5–5.1)
Sodium: 137 mmol/L (ref 135–145)
Total Bilirubin: 1 mg/dL (ref 0.0–1.2)
Total Protein: 6.3 g/dL — ABNORMAL LOW (ref 6.5–8.1)

## 2024-04-11 LAB — BRAIN NATRIURETIC PEPTIDE: B Natriuretic Peptide: 53.7 pg/mL (ref 0.0–100.0)

## 2024-04-11 LAB — MAGNESIUM: Magnesium: 1.7 mg/dL (ref 1.7–2.4)

## 2024-04-11 LAB — HEMOGLOBIN AND HEMATOCRIT, BLOOD
HCT: 40.9 % (ref 39.0–52.0)
Hemoglobin: 13.3 g/dL (ref 13.0–17.0)

## 2024-04-11 LAB — GLUCOSE, CAPILLARY
Glucose-Capillary: 185 mg/dL — ABNORMAL HIGH (ref 70–99)
Glucose-Capillary: 208 mg/dL — ABNORMAL HIGH (ref 70–99)
Glucose-Capillary: 142 mg/dL — ABNORMAL HIGH (ref 70–99)

## 2024-04-11 LAB — MRSA NEXT GEN BY PCR, NASAL: MRSA by PCR Next Gen: DETECTED — AB

## 2024-04-11 LAB — CBG MONITORING, ED: Glucose-Capillary: 156 mg/dL — ABNORMAL HIGH (ref 70–99)

## 2024-04-11 MED ORDER — CARBIDOPA-LEVODOPA 25-100 MG PO TABS
2.0000 | ORAL_TABLET | Freq: Every day | ORAL | Status: DC
  Administered 2024-04-11 – 2024-04-13 (×3): 2 via ORAL
  Filled 2024-04-11 (×4): qty 2

## 2024-04-11 MED ORDER — MEMANTINE HCL 10 MG PO TABS
20.0000 mg | ORAL_TABLET | Freq: Every day | ORAL | Status: DC
  Administered 2024-04-11 – 2024-04-14 (×4): 20 mg via ORAL
  Filled 2024-04-11 (×4): qty 2

## 2024-04-11 MED ORDER — BUSPIRONE HCL 10 MG PO TABS
5.0000 mg | ORAL_TABLET | ORAL | Status: DC
  Administered 2024-04-11 – 2024-04-14 (×7): 5 mg via ORAL
  Filled 2024-04-11 (×7): qty 1

## 2024-04-11 MED ORDER — PANTOPRAZOLE SODIUM 40 MG PO TBEC
40.0000 mg | DELAYED_RELEASE_TABLET | Freq: Every day | ORAL | Status: DC
  Administered 2024-04-11 – 2024-04-14 (×4): 40 mg via ORAL
  Filled 2024-04-11 (×4): qty 1

## 2024-04-11 MED ORDER — SIMVASTATIN 20 MG PO TABS
10.0000 mg | ORAL_TABLET | Freq: Every day | ORAL | Status: DC
  Administered 2024-04-11 – 2024-04-13 (×3): 10 mg via ORAL
  Filled 2024-04-11 (×3): qty 1

## 2024-04-11 MED ORDER — CARBIDOPA-LEVODOPA 25-100 MG PO TABS
3.0000 | ORAL_TABLET | ORAL | Status: DC
  Administered 2024-04-11 – 2024-04-14 (×8): 3 via ORAL
  Filled 2024-04-11 (×8): qty 3

## 2024-04-11 MED ORDER — MAGNESIUM SULFATE 2 GM/50ML IV SOLN
2.0000 g | Freq: Once | INTRAVENOUS | Status: AC
  Administered 2024-04-11: 2 g via INTRAVENOUS
  Filled 2024-04-11: qty 50

## 2024-04-11 MED ORDER — INSULIN ASPART 100 UNIT/ML IJ SOLN
0.0000 [IU] | Freq: Three times a day (TID) | INTRAMUSCULAR | Status: DC
  Administered 2024-04-11: 2 [IU] via SUBCUTANEOUS
  Administered 2024-04-11 – 2024-04-12 (×3): 1 [IU] via SUBCUTANEOUS

## 2024-04-11 MED ORDER — ORAL CARE MOUTH RINSE: 15.0000 mL | OROMUCOSAL | Status: DC | PRN

## 2024-04-11 MED ORDER — BUSPIRONE HCL 10 MG PO TABS
10.0000 mg | ORAL_TABLET | Freq: Every morning | ORAL | Status: DC
  Administered 2024-04-11 – 2024-04-14 (×4): 10 mg via ORAL
  Filled 2024-04-11 (×4): qty 1

## 2024-04-11 MED ORDER — CARBIDOPA-LEVODOPA 25-100 MG PO TABS: 2.0000 | ORAL_TABLET | ORAL | Status: DC

## 2024-04-11 MED ORDER — BUSPIRONE HCL 10 MG PO TABS: 5.0000 mg | ORAL_TABLET | ORAL | Status: DC

## 2024-04-11 MED ORDER — PIMAVANSERIN TARTRATE 34 MG PO CAPS
34.0000 mg | ORAL_CAPSULE | Freq: Every day | ORAL | Status: DC
  Administered 2024-04-12 – 2024-04-14 (×3): 34 mg via ORAL
  Filled 2024-04-11 (×6): qty 1

## 2024-04-11 MED ORDER — RASAGILINE MESYLATE 1 MG PO TABS
1.0000 mg | ORAL_TABLET | Freq: Every day | ORAL | Status: DC
  Administered 2024-04-11 – 2024-04-14 (×4): 1 mg via ORAL
  Filled 2024-04-11 (×5): qty 1

## 2024-04-11 NOTE — ED Notes (Signed)
 Secretary consulted to order continuous irrigation bag for foley

## 2024-04-11 NOTE — Hospital Course (Signed)
 78 year old male with history of Parkinson's dementia, chronic urinary retention, recurrent ESBL UTI, CVA with left-sided weakness, DVT on Eliquis , HFpEF, DM, presenting with gross hematuria subsequent to IR placed suprapubic catheter.   Assessment and Plan:   Acute gross hematuria - Secondary to suprapubic catheter placement and concurrent Eliquis  use.  Evaluated by urology, following closely.  Foley placed with intermittent flushing/evacuation recommended.  No need for CBI at this time.  Holding Eliquis .   Chronic urinary obstruction with hydronephrosis - Indication for suprapubic catheter.  CT noting hydronephrosis, bladder diverticula suggestive of chronic obstruction.   History of ESBL UTI - Multiple urinary tract infections in past.  Patient noting fever 100.6 on presentation.  UA contaminated by blood.  Will continue empiric meropenem  for now.   Concern for sepsis - Tachycardia, fever, mildly elevated lactate on presentation gave concern for sepsis.  Blood cultures, IV fluid hydration, empiric antibiotics on board.  Continue to monitor blood pressure closely.   Parkinson's dementia - Will restart patient's home Parkinson's medication regiment - History of DVT - Holding Eliquis  for the time being.   HFpEF - Peers euvolemic at this time.  Monitor urine output.   Diabetes mellitus - Placed on insulin  sliding scale.

## 2024-04-11 NOTE — ED Notes (Signed)
 Old sacral dressing removed, new one applied.

## 2024-04-11 NOTE — Plan of Care (Signed)
  Problem: Education: Goal: Knowledge of General Education information will improve Description: Including pain rating scale, medication(s)/side effects and non-pharmacologic comfort measures Outcome: Progressing   Problem: Clinical Measurements: Goal: Respiratory complications will improve Outcome: Progressing Goal: Cardiovascular complication will be avoided Outcome: Progressing   Problem: Pain Managment: Goal: General experience of comfort will improve and/or be controlled Outcome: Progressing

## 2024-04-11 NOTE — ED Notes (Signed)
 Checked patient blood sugar it was 136 patient is resting with family at bedside and call bell in reach got patient some warm blankets

## 2024-04-11 NOTE — Progress Notes (Signed)
 Progress Note   Patient: Robert [name] XWR:[id number] DOB: 1946-04-17 DOA: 04/10/2024  DOS: the patient was seen and examined on 04/11/2024   Brief hospital course:  78 year old male with history of Parkinson's dementia, chronic urinary retention, recurrent ESBL UTI, CVA with left-sided weakness, DVT on Eliquis , HFpEF, DM, presenting with gross hematuria subsequent to IR placed suprapubic catheter.  Assessment and Plan:  Acute gross hematuria - Secondary to suprapubic catheter placement and concurrent Eliquis  use.  Evaluated by urology, following closely.  Foley placed with intermittent flushing/evacuation recommended.  No need for CBI at this time.  Holding Eliquis .  Chronic urinary obstruction with hydronephrosis - Indication for suprapubic catheter.  CT noting hydronephrosis, bladder diverticula suggestive of chronic obstruction.  History of ESBL UTI - Multiple urinary tract infections in past.  Patient noting fever 100.6 on presentation.  UA contaminated by blood.  Will continue empiric meropenem  for now.  Concern for sepsis - Tachycardia, fever, mildly elevated lactate on presentation gave concern for sepsis.  Blood cultures, IV fluid hydration, empiric antibiotics on board.  Continue to monitor blood pressure closely.  Parkinson's dementia - Will restart patient's home Parkinson's medication regiment - History of DVT - Holding Eliquis  for the time being.  HFpEF - Peers euvolemic at this time.  Monitor urine output.  Diabetes mellitus - Placed on insulin  sliding scale.    Subjective: Patient resting comfortably, family at bedside.  Denies any fever, chills, chest pain, nausea, vomiting abdominal pain.  Reported fever yesterday 100.6.  Still having frank hematuria and Foley bag.  Physical Exam:  Vitals:   04/11/24 0230 04/11/24 0429 04/11/24 0700 04/11/24 1000  BP: 138/85 (!) 161/83 (!) 159/96 (!) 145/89  Pulse: 98 (!) 105 (!) 104 100  Resp: (!) 22 (!) 26 (!) 23 20   Temp: 98.6 F (37 C) 99.4 F (37.4 C)    TempSrc: Oral Oral    SpO2: 93% 100% 98% 100%  Weight:      Height:        GENERAL:  Alert, pleasant, no acute distress  HEENT:  EOMI CARDIOVASCULAR:  RRR, no murmurs appreciated RESPIRATORY:  Clear to auscultation, no wheezing, rales, or rhonchi GASTROINTESTINAL:  Soft, nontender, nondistended EXTREMITIES:  No LE edema bilaterally NEURO:  No new focal deficits appreciated SKIN:  No rashes noted PSYCH: Pleasant    Data Reviewed:  No new imaging to review  Previous records (including but not limited to H&P, progress notes, nursing notes, TOC management) were reviewed in assessment of this patient.  Labs: CBC: Recent Labs  Lab 04/10/24 0842 04/10/24 1928 04/11/24 0150 04/11/24 0428  WBC 6.2 10.5  --  10.6*  NEUTROABS  --  8.1*  --  7.5  HGB 14.3 14.4 13.3 13.6  HCT 45.8 44.5 40.9 41.9  MCV 93.5 91.6  --  90.9  PLT 317 312  --  330   Basic Metabolic Panel: Recent Labs  Lab 04/10/24 1928 04/11/24 0428  NA 137 137  K 4.2 3.9  CL 100 104  CO2 26 24  GLUCOSE 156* 147*  BUN 15 15  CREATININE 1.11 1.02  CALCIUM 9.3 8.6*  MG 1.7 1.7   Liver Function Tests: Recent Labs  Lab 04/11/24 0428  AST 11*  ALT 10  ALKPHOS 62  BILITOT 1.0  PROT 6.3*  ALBUMIN 2.9*   CBG: Recent Labs  Lab 04/10/24 0843 04/11/24 0757  GLUCAP 142* 156*    Scheduled Meds:  busPIRone   10 mg Oral q morning   And  busPIRone   5 mg Oral 2 times per day   carbidopa -levodopa   2 tablet Oral QPC supper   carbidopa -levodopa   3 tablet Oral 2 times per day   Chlorhexidine  Gluconate Cloth  6 each Topical Daily   insulin  aspart  0-6 Units Subcutaneous TID WC   memantine   20 mg Oral Daily   pantoprazole   40 mg Oral Daily   Pimavanserin  Tartrate  34 mg Oral Daily   rasagiline   1 mg Oral Daily   simvastatin   10 mg Oral QHS   Continuous Infusions:  meropenem  (MERREM ) IV Stopped (04/11/24 0608)   PRN Meds:.acetaminophen  **OR** acetaminophen ,  fentaNYL  (SUBLIMAZE ) injection, melatonin, ondansetron  (ZOFRAN ) IV  Family Communication: Family at bedside  Disposition: Status is: Inpatient Remains inpatient appropriate because: Hematuria     Time spent: 38 minutes  Length of inpatient stay: 1 days  Author: Jodeane Mulligan, DO 04/11/2024 11:44 AM  For on call review [email or web address].

## 2024-04-11 NOTE — Consult Note (Signed)
 Urology Consult Note   Requesting Attending Physician:  Jodeane Mulligan, DO Service Providing Consult: Urology  Consulting Attending: Dr. Inga Manges   Reason for Consult:  Hematuria  HPI: Robert [name] is seen in consultation for reasons noted above at the request of Jodeane Mulligan, DO  ------------------  Assessment:   78 y.o. male with gross hematuria following IR placed SPT.   Recommendations: #gross hematuria #neurogenic bladder  6f foley catheter placed in ED at the direction of Dr. Inga Manges with hand irrigation. Still very frank bleeding the next day.  Hand irrigated and CBI connected to SPT pigtail, draining adequately through foley on low gtt.  Continue to hold Eliquis  if medically reasonable.   Urology will follow. Plan and troubleshooting reviewed with nursing.   Case and plan discussed with Dr. Inga Manges  Past Medical History: Past Medical History:  Diagnosis Date   Anemia    BPH (benign prostatic hyperplasia)    Carotid artery occlusion    CVA (cerebral vascular accident) [facility])    with left sided hemiparesis   Diabetes mellitus without complication (HCC)    Diverticulosis    Frequency of urination    GERD (gastroesophageal reflux disease)    Gross hematuria    History of acute pyelonephritis    10-13-2012   History of CVA with residual deficit    2008--  left side of body weakness and foot drop (wears leg brace and uses cane)   History of DVT of lower extremity    2008--  cva   Hypercholesteremia    Hyperlipidemia    Hyperlipidemia    Hypertension    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 2008   Left leg DVT (HCC)    Neuromuscular disorder (HCC)    Parkinsons   S/P insertion of IVC (inferior vena caval) filter    2008   Urethral stricture    Urgency of urination    Urinary retention    Weakness of left side of body    secondary to cva 2008   Wears glasses    Wears hearing aid    bilateral-- wears intermittantly    Past Surgical History:   Past Surgical History:  Procedure Laterality Date   CYSTOSCOPY WITH RETROGRADE URETHROGRAM N/A 10/23/2015   Procedure: CYSTOSCOPY WITH RETROGRADE URETHROGRAM;  Surgeon: Ann Barnacle, MD;  Location: [facility];  Service: Urology;  Laterality: N/A;   CYSTOSCOPY WITH URETHRAL DILATATION N/A 10/23/2015   Procedure: CYSTOSCOPY WITH URETHRAL BALLOON DILATATION;  Surgeon: Ann Barnacle, MD;  Location: [facility];  Service: Urology;  Laterality: N/A;  BALLOON DILATION    IVC FILTER PLACEMENT ([facility] HX)  2008   LAPAROSCOPIC CHOLECYSTECTOMY SINGLE PORT N/A 01/09/2021   Procedure: LAPAROSCOPIC CHOLECYSTECTOMY WITH IOC;  Surgeon: Candyce Champagne, MD;  Location: [facility];  Service: General;  Laterality: N/A;  90 MIN   TRANSCAROTID ARTERY REVASCULARIZATION  Left 05/12/2021   Procedure: LEFT TRANSCAROTID ARTERY REVASCULARIZATION;  Surgeon: Young Hensen, MD;  Location: [facility];  Service: Vascular;  Laterality: Left;    Medication: Current Facility-Administered Medications  Medication Dose Route Frequency Provider Last Rate Last Admin   acetaminophen  (TYLENOL ) tablet 650 mg  650 mg Oral Q6H PRN Howerter, Justin B, DO       Or   acetaminophen  (TYLENOL ) suppository 650 mg  650 mg Rectal Q6H PRN Howerter, Justin B, DO       carbidopa -levodopa  (SINEMET  IR) 25-100 MG per tablet immediate release 2 tablet  2 tablet Oral QPC supper Howerter, Justin B, DO       carbidopa -levodopa  (SINEMET  IR) 25-100 MG per tablet immediate release 3 tablet  3 tablet Oral 2 times per day Howerter, Justin B, DO   3 tablet at 04/11/24 0808   Chlorhexidine  Gluconate Cloth 2 % PADS 6 each  6 each Topical Daily Howerter, Justin B, DO       fentaNYL  (SUBLIMAZE ) injection 50 mcg  50 mcg Intravenous Q2H PRN Howerter, Justin B, DO   50 mcg at 04/11/24 0031   insulin  aspart (novoLOG ) injection 0-6 Units  0-6 Units Subcutaneous TID WC Howerter, Justin B, DO   1 Units at 04/11/24 6578   melatonin tablet 3 mg  3  mg Oral QHS PRN Howerter, Justin B, DO       meropenem  (MERREM ) 1 g in sodium chloride  0.9 % 100 mL IVPB  1 g Intravenous Q8H Utomwen, [place], RPH   Stopped at 04/11/24 0608   ondansetron  (ZOFRAN ) injection 4 mg  4 mg Intravenous Q6H PRN Howerter, Justin B, DO       pantoprazole  (PROTONIX ) EC tablet 40 mg  40 mg Oral Daily Howerter, Justin B, DO       rasagiline  (AZILECT ) tablet 1 mg  1 mg Oral Daily Howerter, Justin B, DO       simvastatin  (ZOCOR ) tablet 10 mg  10 mg Oral QHS Howerter, Justin B, DO       Current Outpatient Medications  Medication Sig Dispense Refill   acetaminophen  (TYLENOL ) 500 MG tablet Take 1,000 mg by mouth every 6 (six) hours as needed for mild pain (pain score 1-3), moderate pain (pain score 4-6) or fever.     apixaban  (ELIQUIS ) 5 MG TABS tablet Take 1 tablet (5 mg total) by mouth 2 (two) times daily. 60 tablet 0   ascorbic acid (VITAMIN C) 250 MG tablet Take 250 mg by mouth in the morning and at bedtime.     aspirin  EC 81 MG tablet Take 81 mg by mouth daily. Swallow whole.     bacitracin 500 UNIT/GM ointment Apply 1 Application topically daily as needed for wound care (near catheter).     busPIRone  (BUSPAR ) 10 MG tablet Take 5-10 mg by mouth See admin instructions. Take 10 mg by mouth in the morning and 5 mg at noon & bedtime     carbidopa -levodopa  (SINEMET ) 25-100 MG tablet Take 2-3 tablets by mouth See admin instructions. Take 3 tablets by mouth at 7 AM & 12 Noon and 2 tablets between 5-6 PM/after the evening meal     Cholecalciferol 50 MCG (2000 UT) TABS Take 2,000 Units by mouth daily.     [Paused] Dupilumab 300 MG/2ML SOAJ Inject 300 mg into the skin.     HYDROCORTISONE  ACE, RECTAL, 30 MG SUPP Place 30 mg rectally at bedtime.     melatonin 3 MG TABS tablet Take 6 mg by mouth at bedtime.     memantine  (NAMENDA ) 10 MG tablet Take 20 mg by mouth at bedtime.     metFORMIN  (GLUCOPHAGE ) 500 MG tablet Take 500 mg by mouth at bedtime.     methenamine (MANDELAMINE) 1 g  tablet Take 1,000 mg by mouth See admin instructions. Crush 1,000 mg and place into applesauce- take morning and bedtime     miconazole (MICOTIN) 2 % powder Apply 1 Application topically See admin instructions. Apply in the groin area once a day as needed     nystatin-triamcinolone ointment (MYCOLOG) Apply 1 application  topically as needed (rash/yeast).     omeprazole (PRILOSEC) 20 MG capsule Take 20 mg by mouth daily before breakfast.     oxybutynin  (DITROPAN ) 5 MG tablet Take 2.5 mg by mouth in the morning and at bedtime.     Pimavanserin  Tartrate (NUPLAZID ) 34 MG CAPS Take 34 mg by mouth daily.     psyllium (METAMUCIL) 58.6 % powder See admin instructions. Mix 2 teaspoonsful of powder into 16 ounces of water and drink by mouth once a day     rasagiline  (AZILECT ) 1 MG TABS tablet Take 1 mg by mouth daily.     senna-docusate (SENOKOT-S) 8.6-50 MG tablet Take 2 tablets by mouth daily as needed for mild constipation or moderate constipation.     simvastatin  (ZOCOR ) 20 MG tablet Take 10 mg by mouth at bedtime.     tacrolimus (PROTOPIC) 0.1 % ointment Apply 1 application  topically See admin instructions. Apply to groin, armpits, and bottom 2 times a day when skin is broken out     triamcinolone cream (KENALOG) 0.1 % Apply 1 Application topically daily as needed (rash).     zinc  oxide 20 % ointment Apply 1 Application topically See admin instructions. Every time there is a diaper change (Patient taking differently: Apply 1 Application topically See admin instructions. Apply to affected areas with every pad change) 56.7 g 0    Allergies: Allergies  Allergen Reactions   Fluconazole Rash and Dermatitis   Propofol  Other (See Comments)    Hiccups for weeks   Lisinopril Cough   Sulfamethoxazole -Trimethoprim  Diarrhea   Fluoxetine Nausea Only and Other (See Comments)    Hallucinations, Feeling nervous, Nausea, Tachycardia also   Nystatin Rash    Social History: Social History   Tobacco Use    Smoking status: Former    Current packs/day: 0.00    Types: Cigarettes    Start date: 10/26/1960    Quit date: 10/26/1970    Years since quitting: 53.4   Smokeless tobacco: Never  Vaping Use   Vaping status: Never Used  Substance Use Topics   Alcohol use: No   Drug use: No    Family History Family History  Problem Relation Age of Onset   Diabetes Sister    Lung cancer Brother        Post 9/11 voluntary work in [employer]   Prostate cancer Brother    Other Mother        passed away young- non medical   Alzheimer's disease Father    Healthy Son     Review of Systems  Genitourinary:  Positive for hematuria. Negative for dysuria, flank pain, frequency and urgency.     Objective   Vital signs in last 24 hours: BP (!) 159/96   Pulse (!) 104   Temp 99.4 F (37.4 C) (Oral)   Resp (!) 23   Ht 6' 4 (1.93 m)   Wt 99.8 kg   SpO2 98%   BMI 26.78 kg/m   Physical Exam General: A&O, resting, chronically ill HEENT: [place]/AT Pulmonary: Normal work of breathing Cardiovascular: no cyanosis Abdomen: Soft, NTTP, nondistended GU: pigtail SPT sutured in placed with CBI attatched. 38f foley in urethra.   Most Recent Labs: Lab Results  Component Value Date   WBC 10.6 (H) 04/11/2024   HGB 13.6 04/11/2024   HCT 41.9 04/11/2024   PLT 330 04/11/2024    Lab Results  Component Value Date   NA 137 04/11/2024   K 3.9 04/11/2024   CL 104  04/11/2024   CO2 24 04/11/2024   BUN 15 04/11/2024   CREATININE 1.02 04/11/2024   CALCIUM 8.6 (L) 04/11/2024   MG 1.7 04/11/2024   PHOS 3.1 02/11/2022    Lab Results  Component Value Date   INR 1.1 04/10/2024   APTT 31 09/15/2023     Urine Culture: @[id number] (laburin,org,[id number],[id number])@   IMAGING: CT Renal Stone Study Result Date: 04/10/2024 CLINICAL DATA:  Hematuria. EXAM: CT ABDOMEN AND PELVIS WITHOUT CONTRAST TECHNIQUE: Multidetector CT imaging of the abdomen and pelvis was performed following the standard protocol without IV contrast.  RADIATION DOSE REDUCTION: This exam was performed according to the departmental dose-optimization program which includes automated exposure control, adjustment of the mA and/or kV according to patient size and/or use of iterative reconstruction technique. COMPARISON:  September 15, 2023 FINDINGS: Lower chest: No acute abnormality. Hepatobiliary: No focal liver abnormality is seen. Status post cholecystectomy. No biliary dilatation. Pancreas: Unremarkable. No pancreatic ductal dilatation or surrounding inflammatory changes. Spleen: Normal in size without focal abnormality. Adrenals/Urinary Tract: Adrenal glands are unremarkable. Kidneys are normal, without renal calculi or focal lesions. There is mild to moderate severity bilateral hydronephrosis and hydroureter. A suprapubic catheter is seen within the urinary bladder. Mild diffuse urinary bladder wall thickening is noted. Multiple large urinary bladder diverticula are seen, some of which contain a small amount of air. A 5.6 cm x 5.9 cm x 7.0 cm area of heterogeneous increased attenuation (approximately 66.80 Hounsfield units) is seen throughout the bladder lumen. Stomach/Bowel: Stomach is within normal limits. Appendix appears normal. No evidence of bowel wall thickening, distention, or inflammatory changes. Noninflamed diverticula are seen throughout the descending and sigmoid colon. Vascular/Lymphatic: Aortic atherosclerosis. An inferior vena cava filter is in place. No enlarged abdominal or pelvic lymph nodes. Reproductive: Brachytherapy seeds are seen within an enlarged prostate gland. Other: No abdominal wall hernia or abnormality. No abdominopelvic ascites. Musculoskeletal: Multilevel degenerative changes are seen throughout the lumbar spine. IMPRESSION: 1. Findings consistent with a large amount of blood products within the urinary bladder lumen. Further evaluation with cystoscopy is recommended. 2. Multiple large urinary bladder diverticula, some of which  contain a small amount of air. 3. Mild to moderate severity bilateral hydronephrosis and hydroureter. 4. Colonic diverticulosis. 5. Evidence of prior cholecystectomy. 6. Brachytherapy seeds within an enlarged prostate gland. Correlation with PSA levels is recommended. 7. Aortic atherosclerosis. Electronically Signed   By: Virgle Grime M.D.   On: 04/10/2024 20:55   DG Chest Port 1 View Result Date: 04/10/2024 CLINICAL DATA:  Possible sepsis. EXAM: PORTABLE CHEST 1 VIEW COMPARISON:  03/31/2024. FINDINGS: The heart is enlarged mediastinal contours within normal limits. There is atherosclerotic calcification of the aorta. The pulmonary vasculature is distended. Chronic elevation of the right diaphragm is noted with mild airspace disease at the lung bases. No effusion or pneumothorax is seen. No acute osseous abnormality. IMPRESSION: 1. Cardiomegaly with pulmonary vascular congestion. 2. Mild airspace disease at the lung bases, possible atelectasis, edema, or infiltrate. Electronically Signed   By: Wyvonnia Heimlich M.D.   On: 04/10/2024 19:56   CT GUIDED SUPERPUBIC CATHETER PLMT Result Date: 04/10/2024 INDICATION: Chronic urinary retention, prior stroke, Parkinson's disease and dependence on chronic indwelling Foley catheter. Need for suprapubic bladder drainage catheter placement. EXAM: CT GUIDED SUPRAPUBIC CATHETER PLACEMENT COMPARISON:  None Available. MEDICATIONS: 2 g IV Ancef ; The antibiotic was administered in an appropriate time frame prior to skin puncture. ANESTHESIA/SEDATION: Moderate (conscious) sedation was employed during this procedure. A total of Versed  2.0 mg and Fentanyl   100 mcg was administered intravenously. Moderate Sedation Time: 26 minutes. The patient's level of consciousness and vital signs were monitored continuously by radiology nursing throughout the procedure under my direct supervision. CONTRAST:  None FLUOROSCOPY TIME:  Procedure done under CT guidance. COMPLICATIONS: None immediate.  PROCEDURE: Informed written consent was obtained from the patient's wife after a thorough discussion of the procedural risks, benefits and alternatives. All questions were addressed. Maximal Sterile Barrier Technique was utilized including caps, mask, sterile gowns, sterile gloves, sterile drape, hand hygiene and skin antiseptic. A timeout was performed prior to the initiation of the procedure. CT was performed in a supine position through the pelvis after distension of the bladder with 250 mL of sterile water. After choosing a site for tube placement in the suprapubic region, the skin was prepped with chlorhexidine  and local anesthesia provided with 1% lidocaine . An 18 gauge trocar needle was advanced under CT guidance into the bladder. After return of urine, the needle was removed over a guidewire. The percutaneous tract was dilated over the wire and a 14 French pigtail drainage catheter advanced into the bladder. Final catheter position was confirmed by CT. The drainage catheter was then attached to a gravity drainage bag. It was secured at the skin with a Prolene retention suture and adhesive StatLock device. After suprapubic catheter placement, an indwelling Foley catheter was removed. FINDINGS: After bladder distension, a suprapubic window was present under CT to the level of the bladder. A 14 French catheter was able to be placed into the bladder lumen with return of blood tinged urine after placement. IMPRESSION: Successful placement of 14 French suprapubic bladder drainage catheter under CT guidance. After placement, a chronic indwelling Foley catheter was removed. The patient will be brought back in approximately 6 weeks for up sizing of the suprapubic catheter under fluoroscopy to a 16 [place] Foley catheter. Electronically Signed   By: Erica Hau M.D.   On: 04/10/2024 14:43    ------  Alla Ar, NP Pager: [phone]   Please contact the urology consult pager with any  further questions/concerns.

## 2024-04-12 DIAGNOSIS — R310 Gross hematuria: Secondary | ICD-10-CM | POA: Diagnosis not present

## 2024-04-12 LAB — GLUCOSE, CAPILLARY
Glucose-Capillary: 136 mg/dL — ABNORMAL HIGH (ref 70–99)
Glucose-Capillary: 142 mg/dL — ABNORMAL HIGH (ref 70–99)
Glucose-Capillary: 154 mg/dL — ABNORMAL HIGH (ref 70–99)
Glucose-Capillary: 164 mg/dL — ABNORMAL HIGH (ref 70–99)
Glucose-Capillary: 188 mg/dL — ABNORMAL HIGH (ref 70–99)

## 2024-04-12 LAB — CBC
HCT: 37.9 % — ABNORMAL LOW (ref 39.0–52.0)
Hemoglobin: 12.1 g/dL — ABNORMAL LOW (ref 13.0–17.0)
MCH: 29.1 pg (ref 26.0–34.0)
MCHC: 31.9 g/dL (ref 30.0–36.0)
MCV: 91.1 fL (ref 80.0–100.0)
Platelets: 266 10*3/uL (ref 150–400)
RBC: 4.16 MIL/uL — ABNORMAL LOW (ref 4.22–5.81)
RDW: 13.2 % (ref 11.5–15.5)
WBC: 8.7 10*3/uL (ref 4.0–10.5)
nRBC: 0 % (ref 0.0–0.2)

## 2024-04-12 LAB — BASIC METABOLIC PANEL WITH GFR
Anion gap: 5 (ref 5–15)
BUN: 13 mg/dL (ref 8–23)
CO2: 26 mmol/L (ref 22–32)
Calcium: 8.2 mg/dL — ABNORMAL LOW (ref 8.9–10.3)
Chloride: 105 mmol/L (ref 98–111)
Creatinine, Ser: 1.12 mg/dL (ref 0.61–1.24)
GFR, Estimated: >60 mL/min (ref >60)
Glucose, Bld: 142 mg/dL — ABNORMAL HIGH (ref 70–99)
Potassium: 3.9 mmol/L (ref 3.5–5.1)
Sodium: 136 mmol/L (ref 135–145)

## 2024-04-12 MED ORDER — PSYLLIUM 95 % PO PACK
1.0000 | PACK | Freq: Every day | ORAL | Status: DC
  Administered 2024-04-12: 1 via ORAL
  Filled 2024-04-12 (×3): qty 1

## 2024-04-12 MED ORDER — SENNOSIDES-DOCUSATE SODIUM 8.6-50 MG PO TABS
1.0000 | ORAL_TABLET | Freq: Two times a day (BID) | ORAL | Status: DC
  Administered 2024-04-13: 1 via ORAL
  Filled 2024-04-12 (×4): qty 1

## 2024-04-12 NOTE — Plan of Care (Signed)
  Problem: Education: Goal: Ability to describe self-care measures that may prevent or decrease complications (Diabetes Survival Skills Education) will improve Outcome: Progressing   Problem: Coping: Goal: Ability to adjust to condition or change in health will improve Outcome: Progressing   Problem: Fluid Volume: Goal: Ability to maintain a balanced intake and output will improve Outcome: Progressing   Problem: Health Behavior/Discharge Planning: Goal: Ability to manage health-related needs will improve Outcome: Progressing   Problem: Nutritional: Goal: Maintenance of adequate nutrition will improve Outcome: Progressing   Problem: Education: Goal: Knowledge of General Education information will improve Description: Including pain rating scale, medication(s)/side effects and non-pharmacologic comfort measures Outcome: Progressing   Problem: Health Behavior/Discharge Planning: Goal: Ability to manage health-related needs will improve Outcome: Progressing   Problem: Clinical Measurements: Goal: Ability to maintain clinical measurements within normal limits will improve Outcome: Progressing Goal: Respiratory complications will improve Outcome: Progressing Goal: Cardiovascular complication will be avoided Outcome: Progressing   Problem: Nutrition: Goal: Adequate nutrition will be maintained Outcome: Progressing   Problem: Pain Managment: Goal: General experience of comfort will improve and/or be controlled Outcome: Progressing

## 2024-04-12 NOTE — Progress Notes (Signed)
 PROGRESS NOTE    Robert [name]  ZOX:[id number] DOB: 07-Jun-1946 DOA: 04/10/2024 PCP: [facility]   Brief Narrative:   78 year old male with history of Parkinson's dementia, chronic urinary retention, recurrent ESBL UTI, CVA with left-sided weakness, DVT on Eliquis , HFpEF, DM, presenting with gross hematuria subsequent to IR placed suprapubic catheter. Urology is following, has a foley in place.Held eliquis .  Assessment & Plan:  Principal Problem:   Gross hematuria Active Problems:   DM2 (diabetes mellitus, type 2) (HCC)   HLD (hyperlipidemia)   History of DVT (deep vein thrombosis)   Parkinson's disease (HCC)   SIRS (systemic inflammatory response syndrome) (HCC)   Chronic diastolic heart failure (HCC)   Acute gross hematuria - Secondary to suprapubic catheter placement and concurrent Eliquis  use.  Evaluated by urology, following closely.  Foley placed with intermittent flushing/evacuation recommended.  No need for CBI at this time.  Holding Eliquis . Foley's catheter can be removed tomorrow if urine is clear.   Chronic urinary obstruction with hydronephrosis - Indication for suprapubic catheter.  CT noting hydronephrosis, bladder diverticula suggestive of chronic obstruction.   History of ESBL UTI - Multiple urinary tract infections in past.  Patient noting fever 100.6 on presentation but afebrile currently.  UA contaminated by blood.  Will continue empiric meropenem  for now.   Concern for sepsis - Tachycardia, fever, mildly elevated lactate on presentation gave concern for sepsis.  Blood cultures, IV fluid hydration, empiric antibiotics on board.  Continue to monitor blood pressure closely.   Parkinson's dementia - continue with sinemet  - History of DVT - Holding Eliquis  for the time being.   HFpEF,POA: NYHA III, not in exacerbation. - Euvolemic at this time.  Monitor urine output.   Diabetes mellitus type 2 - Placed on insulin  sliding scale.   DVT  prophylaxis: SCDs Start: 04/10/24 2207     Code Status: Full Code Family Communication:   Status is: Inpatient Remains inpatient appropriate because: hematuria    Subjective: No acute events overnight.  Patient's wife was present at the bedside.  Patient was complaining of abdominal distention.  Patient did not have a bowel movement for the past 3 to 4 days as per patient's wife.  She states that she gives him Metamucil and senna at home regularly.   Examination:  General exam: Appears calm and comfortable  Respiratory system: Clear to auscultation. Respiratory effort normal. Cardiovascular system: S1 & S2 heard, RRR. No JVD, murmurs, rubs, gallops or clicks. No pedal edema. Gastrointestinal system: Abdomen is nondistended, soft and nontender. No organomegaly or masses felt. Normal bowel sounds heard. Central nervous system: Alert and oriented. No focal neurological deficits. Extremities: Symmetric 5 x 5 power. Skin: No rashes, lesions or ulcers Psychiatry: Judgement and insight appear normal. Mood & affect appropriate. Genitourinary: Foley catheter in place, suprapubic catheter in place  @ENCIMAGES @  Pressure Injury 03/31/24 Buttocks Right;Left Stage 2 -  Partial thickness loss of dermis presenting as a shallow open injury with a red, pink wound bed without slough. (Active)  03/31/24 2154  Location: Buttocks  Location Orientation: Right;Left  Staging: Stage 2 -  Partial thickness loss of dermis presenting as a shallow open injury with a red, pink wound bed without slough.  Wound Description (Comments):   DO NOT USE:  Present on Admission: Yes     Diet Orders (From admission, onward)     Start     Ordered   04/11/24 1141  Diet regular Room service appropriate? Yes; Fluid consistency: Thin  Diet effective  now       Question Answer Comment  Room service appropriate? Yes   Fluid consistency: Thin      04/11/24 1140            Objective: Vitals:   04/12/24 0612  04/12/24 0738 04/12/24 1111 04/12/24 1531  BP:  123/88 136/80 137/70  Pulse:    85  Resp:  (!) 22 (!) 23   Temp:  99.1 F (37.3 C) 98.5 F (36.9 C) 98.6 F (37 C)  TempSrc:  Oral Oral Oral  SpO2:    96%  Weight: 104.8 kg     Height:        Intake/Output Summary (Last 24 hours) at 04/12/2024 1628 Last data filed at 04/12/2024 0741 Gross per 24 hour  Intake 120 ml  Output 2225 ml  Net -2105 ml   Filed Weights   04/10/24 1533 04/11/24 1600 04/12/24 0612  Weight: 99.8 kg 100.9 kg 104.8 kg    Scheduled Meds:  busPIRone   10 mg Oral q morning   And   busPIRone   5 mg Oral 2 times per day   carbidopa -levodopa   2 tablet Oral QPC supper   carbidopa -levodopa   3 tablet Oral 2 times per day   Chlorhexidine  Gluconate Cloth  6 each Topical Daily   insulin  aspart  0-6 Units Subcutaneous TID WC   memantine   20 mg Oral Daily   pantoprazole   40 mg Oral Daily   Pimavanserin  Tartrate  34 mg Oral Daily   rasagiline   1 mg Oral Daily   simvastatin   10 mg Oral QHS   Continuous Infusions:  meropenem  (MERREM ) IV 1 g (04/12/24 1442)    Nutritional status     Body mass index is 28.12 kg/m.  Data Reviewed:   CBC: Recent Labs  Lab 04/10/24 0842 04/10/24 1928 04/11/24 0150 04/11/24 0428 04/12/24 0249  WBC 6.2 10.5  --  10.6* 8.7  NEUTROABS  --  8.1*  --  7.5  --   HGB 14.3 14.4 13.3 13.6 12.1*  HCT 45.8 44.5 40.9 41.9 37.9*  MCV 93.5 91.6  --  90.9 91.1  PLT 317 312  --  330 266   Basic Metabolic Panel: Recent Labs  Lab 04/10/24 1928 04/11/24 0428 04/12/24 0249  NA 137 137 136  K 4.2 3.9 3.9  CL 100 104 105  CO2 26 24 26   GLUCOSE 156* 147* 142*  BUN 15 15 13   CREATININE 1.11 1.02 4.09  CALCIUM 9.3 8.6* 8.2*  MG 1.7 1.7  --    GFR: Estimated Creatinine Clearance: 74.6 mL/min (by C-G formula based on SCr of 1.12 mg/dL). Liver Function Tests: Recent Labs  Lab 04/11/24 0428  AST 11*  ALT 10  ALKPHOS 62  BILITOT 1.0  PROT 6.3*  ALBUMIN 2.9*   No results for  input(s): LIPASE, AMYLASE in the last 168 hours. No results for input(s): AMMONIA in the last 168 hours. Coagulation Profile: Recent Labs  Lab 04/10/24 0842 04/10/24 2038  INR 1.1 1.1   Cardiac Enzymes: No results for input(s): CKTOTAL, CKMB, CKMBINDEX, TROPONINI in the last 168 hours. BNP (last 3 results) No results for input(s): PROBNP in the last 8760 hours. HbA1C: No results for input(s): HGBA1C in the last 72 hours. CBG: Recent Labs  Lab 04/11/24 1627 04/11/24 1815 04/11/24 2145 04/12/24 0607 04/12/24 1144  GLUCAP 185* 208* 142* 142* 154*   Lipid Profile: No results for input(s): CHOL, HDL, LDLCALC, TRIG, CHOLHDL, LDLDIRECT in the last 72 hours.  Thyroid  Function Tests: No results for input(s): TSH, T4TOTAL, FREET4, T3FREE, THYROIDAB in the last 72 hours. Anemia Panel: No results for input(s): VITAMINB12, FOLATE, FERRITIN, TIBC, IRON, RETICCTPCT in the last 72 hours. Sepsis Labs: Recent Labs  Lab 04/10/24 2047 04/11/24 0428  PROCALCITON  --  <0.10  LATICACIDVEN 1.6  --     Recent Results (from the past 240 hours)  Blood culture (routine x 2)     Status: None (Preliminary result)   Collection Time: 04/10/24  8:38 PM   Specimen: BLOOD RIGHT ARM  Result Value Ref Range Status   Specimen Description BLOOD RIGHT ARM  Final   Special Requests   Final    BOTTLES DRAWN AEROBIC AND ANAEROBIC Blood Culture adequate volume   Culture   Final    NO GROWTH 2 DAYS Performed at [facility], [address], [place], [place] [address]    Report Status PENDING  Incomplete  Blood culture (routine x 2)     Status: None (Preliminary result)   Collection Time: 04/10/24  8:38 PM   Specimen: BLOOD RIGHT ARM  Result Value Ref Range Status   Specimen Description BLOOD RIGHT ARM  Final   Special Requests   Final    BOTTLES DRAWN AEROBIC AND ANAEROBIC Blood Culture adequate volume   Culture   Final    NO GROWTH 2  DAYS Performed at [facility], [address] [place], [place] [address]    Report Status PENDING  Incomplete  Resp panel by RT-PCR (RSV, Flu A&B, Covid)     Status: None   Collection Time: 04/10/24  8:38 PM   Specimen: Nasal Swab  Result Value Ref Range Status   SARS Coronavirus 2 by RT PCR NEGATIVE NEGATIVE Final   Influenza A by PCR NEGATIVE NEGATIVE Final   Influenza B by PCR NEGATIVE NEGATIVE Final    Comment: (NOTE) The Xpert Xpress SARS-CoV-2/FLU/RSV plus assay is intended as an aid in the diagnosis of influenza from Nasopharyngeal swab specimens and should not be used as a sole basis for treatment. Nasal washings and aspirates are unacceptable for Xpert Xpress SARS-CoV-2/FLU/RSV testing.  Fact Sheet for Patients: BloggerCourse.com  Fact Sheet for Healthcare Providers: SeriousBroker.it  This test is not yet approved or cleared by the [place]  FDA and has been authorized for detection and/or diagnosis of SARS-CoV-2 by FDA under an Emergency Use Authorization (EUA). This EUA will remain in effect (meaning this test can be used) for the duration of the COVID-19 declaration under Section 564(b)(1) of the Act, 21 U.S.C. section 360bbb-3(b)(1), unless the authorization is terminated or revoked.     Resp Syncytial Virus by PCR NEGATIVE NEGATIVE Final    Comment: (NOTE) Fact Sheet for Patients: BloggerCourse.com  Fact Sheet for Healthcare Providers: SeriousBroker.it  This test is not yet approved or cleared by the [place]  FDA and has been authorized for detection and/or diagnosis of SARS-CoV-2 by FDA under an Emergency Use Authorization (EUA). This EUA will remain in effect (meaning this test can be used) for the duration of the COVID-19 declaration under Section 564(b)(1) of the Act, 21 U.S.C. section 360bbb-3(b)(1), unless the authorization is terminated  or revoked.  Performed at [facility], [address], [place], [place] [address]   Culture, [place] Urine     Status: Abnormal (Preliminary result)   Collection Time: 04/10/24 10:29 PM   Specimen: Urine, Random  Result Value Ref Range Status   Specimen Description URINE, RANDOM  Final   Special Requests NONE  Final   Culture (A)  Final    10,000 COLONIES/mL KLEBSIELLA PNEUMONIAE SUSCEPTIBILITIES TO FOLLOW NO GROUP B STREP (S.AGALACTIAE) ISOLATED Performed at [facility], [address], [place], [place] [address]    Report Status PENDING  Incomplete  MRSA Next Gen by PCR, Nasal     Status: Abnormal   Collection Time: 04/11/24  4:09 PM   Specimen: Nasal Mucosa; Nasal Swab  Result Value Ref Range Status   MRSA by PCR Next Gen DETECTED (A) NOT DETECTED Final    Comment: RESULT CALLED TO, READ BACK BY AND VERIFIED WITH: RN JANAE ADAMS ON 04/11/24 @ 1949 BY DRT (NOTE) The GeneXpert MRSA Assay (FDA approved for NASAL specimens only), is one component of a comprehensive MRSA colonization surveillance program. It is not intended to diagnose MRSA infection nor to guide or monitor treatment for MRSA infections. Test performance is not FDA approved in patients less than 57 years old. Performed at [facility], [address], [place], [place] [address]          Radiology Studies: CT Renal Stone Study Result Date: 04/10/2024 CLINICAL DATA:  Hematuria. EXAM: CT ABDOMEN AND PELVIS WITHOUT CONTRAST TECHNIQUE: Multidetector CT imaging of the abdomen and pelvis was performed following the standard protocol without IV contrast. RADIATION DOSE REDUCTION: This exam was performed according to the departmental dose-optimization program which includes automated exposure control, adjustment of the mA and/or kV according to patient size and/or use of iterative reconstruction technique. COMPARISON:  September 15, 2023 FINDINGS: Lower chest: No acute abnormality. Hepatobiliary: No focal  liver abnormality is seen. Status post cholecystectomy. No biliary dilatation. Pancreas: Unremarkable. No pancreatic ductal dilatation or surrounding inflammatory changes. Spleen: Normal in size without focal abnormality. Adrenals/Urinary Tract: Adrenal glands are unremarkable. Kidneys are normal, without renal calculi or focal lesions. There is mild to moderate severity bilateral hydronephrosis and hydroureter. A suprapubic catheter is seen within the urinary bladder. Mild diffuse urinary bladder wall thickening is noted. Multiple large urinary bladder diverticula are seen, some of which contain a small amount of air. A 5.6 cm x 5.9 cm x 7.0 cm area of heterogeneous increased attenuation (approximately 66.80 Hounsfield units) is seen throughout the bladder lumen. Stomach/Bowel: Stomach is within normal limits. Appendix appears normal. No evidence of bowel wall thickening, distention, or inflammatory changes. Noninflamed diverticula are seen throughout the descending and sigmoid colon. Vascular/Lymphatic: Aortic atherosclerosis. An inferior vena cava filter is in place. No enlarged abdominal or pelvic lymph nodes. Reproductive: Brachytherapy seeds are seen within an enlarged prostate gland. Other: No abdominal wall hernia or abnormality. No abdominopelvic ascites. Musculoskeletal: Multilevel degenerative changes are seen throughout the lumbar spine. IMPRESSION: 1. Findings consistent with a large amount of blood products within the urinary bladder lumen. Further evaluation with cystoscopy is recommended. 2. Multiple large urinary bladder diverticula, some of which contain a small amount of air. 3. Mild to moderate severity bilateral hydronephrosis and hydroureter. 4. Colonic diverticulosis. 5. Evidence of prior cholecystectomy. 6. Brachytherapy seeds within an enlarged prostate gland. Correlation with PSA levels is recommended. 7. Aortic atherosclerosis. Electronically Signed   By: Virgle Grime M.D.   On:  04/10/2024 20:55   DG Chest Port 1 View Result Date: 04/10/2024 CLINICAL DATA:  Possible sepsis. EXAM: PORTABLE CHEST 1 VIEW COMPARISON:  03/31/2024. FINDINGS: The heart is enlarged mediastinal contours within normal limits. There is atherosclerotic calcification of the aorta. The pulmonary vasculature is distended. Chronic elevation of the  right diaphragm is noted with mild airspace disease at the lung bases. No effusion or pneumothorax is seen. No acute osseous abnormality. IMPRESSION: 1. Cardiomegaly with pulmonary vascular congestion. 2. Mild airspace disease at the lung bases, possible atelectasis, edema, or infiltrate. Electronically Signed   By: Wyvonnia Heimlich M.D.   On: 04/10/2024 19:56           LOS: 2 days   Time spent= 42 mins    Clancy Crimes, MD [facility]  If 7PM-7AM, please contact night-coverage  04/12/2024, 4:28 PM

## 2024-04-12 NOTE — Progress Notes (Addendum)
 [facility] notified [facility] [phone]  Followed [facility], Dr. Harvel Linear CSW Camilo Cella [phone]

## 2024-04-12 NOTE — TOC Progression Note (Signed)
 Transition of Care [facility]) - Progression Note    Patient Details  Name: Robert [name] MRN: [id number] Date of Birth: 06-Jul-1946  Transition of Care [facility]) CM/SW Contact  Jeani Mill, RN Phone Number: 04/12/2024, 3:41 PM  Clinical Narrative:     Robert [name] to [name] , wife, at bedside regarding transition needs.  [name] stated that [facility].  MD placed PT, OT ordered.  Notified  [name] of possible home health needs. Spoke to [name] the CSW with [facility] who states patient receives the max about of caregiver hours.  Patient will need PTAR transportation home.  TOC will continue to follow for needs. Expected Discharge Plan: Home w Home Health Services Barriers to Discharge: Continued Medical Work up  Expected Discharge Plan and Services   Discharge Planning Services: CM Consult Post Acute Care Choice: Home Health Living arrangements for the past 2 months: Apartment                           HH Arranged: PT, OT HH Agency: [facility] Date HH Agency Contacted: 04/12/24 Time HH Agency Contacted: 1540 Representative spoke with at [facility] Agency: [name]   Social Determinants of Health (SDOH) Interventions SDOH Screenings   Food Insecurity: No Food Insecurity (04/11/2024)  Housing: Low Risk  (04/11/2024)  Transportation Needs: No Transportation Needs (04/12/2024)  Recent Concern: Transportation Needs - Unmet Transportation Needs (01/22/2024)  Utilities: At Risk (04/11/2024)  Depression (PHQ2-9): Low Risk  (01/22/2022)  Financial Resource Strain: Medium Risk (02/20/2020)  Social Connections: Socially Isolated (03/31/2024)  Tobacco Use: Medium Risk (04/11/2024)    Readmission Risk Interventions    04/02/2024    1:12 PM 04/02/2024   11:55 AM 02/11/2022   10:35 AM  Readmission Risk Prevention Plan  Transportation Screening Complete Complete Complete  PCP or Specialist Appt within 5-7 Days Complete Complete Complete  Home Care  Screening Complete Complete Complete  Medication Review (RN CM) Complete Complete Complete

## 2024-04-12 NOTE — TOC Initial Note (Signed)
 Transition of Care [facility]) - Initial/Assessment Note    Patient Details  Name: Robert [name] MRN: [id number] Date of Birth: 04/11/1946  Transition of Care [facility]) CM/SW Contact:    Robert Och, LCSW Phone Number: 04/12/2024, 2:34 PM  Clinical Narrative:                  2:34 PM CSW introduced self and role to patient and patient's spouse, Robert [name]. Robert [name] confirmed that she resides with patient. Robert [name] stated patient receives HH through [facility] and [facility]. Robert [name] expressed interest in additional HH coverage. CSW relayed interest to [facility]. Robert [name] confirmed patient has DME at home (per chart review, rolling walker, 3 in 1, transport chair, TTB, hospital bed, hand held shower head, grab bars). Per chart review, patient has SNF history with [facility] and [facility]. Patient has a PCP and insurance through the [place]. Patient receives prescriptions through the [facility]. Robert [name] declined utility resources but expressed interest in [employer], [employer]. CSW provided social connections resources.  Expected Discharge Plan: Home w Home Health Services Barriers to Discharge: Continued Medical Work up   Patient Goals and CMS Choice            Expected Discharge Plan and Services   Discharge Planning Services: CM Consult Post Acute Care Choice: Home Health Living arrangements for the past 2 months: Apartment                                      Prior Living Arrangements/Services Living arrangements for the past 2 months: Apartment Lives with:: Spouse Patient language and need for interpreter reviewed:: Yes        Need for Family Participation in Patient Care: Yes (Comment) Care giver support system in place?: Yes (comment)   Criminal Activity/Legal Involvement Pertinent to Current Situation/Hospitalization: No - Comment as needed  Activities of Daily Living      Permission Sought/Granted Permission sought to share information  with : Family Supports Permission granted to share information with : No (Contact information on chart)  Share Information with NAME: Robert [name]     Permission granted to share info w Relationship: Spouse  Permission granted to share info w Contact Information: [phone]  Emotional Assessment Appearance:: Appears stated age Attitude/Demeanor/Rapport: Engaged Affect (typically observed): Appropriate, Stable, Calm Orientation: : Oriented to Self, Oriented to Place, Oriented to  Time, Oriented to Situation Alcohol / Substance Use: Not Applicable Psych Involvement: No (comment)  Admission diagnosis:  Gross hematuria [R31.0] SIRS (systemic inflammatory response syndrome) (HCC) [R65.10] Hydronephrosis, unspecified hydronephrosis type [N13.30] Patient Active Problem List   Diagnosis Date Noted   SIRS (systemic inflammatory response syndrome) (HCC) 04/11/2024   Chronic diastolic heart failure (HCC) 04/11/2024   Gross hematuria 04/10/2024   Acute metabolic encephalopathy 03/31/2024   Urinary tract infection due to ESBL Klebsiella 01/22/2024   DM2 (diabetes mellitus, type 2) (HCC) 02/10/2022   Embolic stroke (HCC) 05/15/2021   Spasticity as late effect of cerebrovascular accident (CVA) 05/15/2021   Asymptomatic carotid artery stenosis without infarction, left 05/12/2021   Carotid artery disease (HCC) 04/29/2021   Acute pancreatitis    Palliative care by specialist    Goals of care, counseling/discussion    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 01/07/2021   Gallstone pancreatitis 01/07/2021   Pressure injury of skin 01/05/2021   Sepsis secondary to UTI (HCC) 11/10/2020   AKI (acute kidney injury) (  HCC) 11/10/2020   Hyperkalemia 11/10/2020   Parkinson's disease (HCC) 11/10/2020   Hyperglycemia 11/10/2020   Elevated LFTs 11/10/2020   Diarrhea    Other hydronephrosis    History of DVT (deep vein thrombosis)    Urinary retention 08/14/2019   History of CVA with  residual deficit    BPH/urethral stricture with chronic Foley catheter    Orthostatic hypotension 08/13/2019   HLD (hyperlipidemia)    History of CVA with residual left hemiparesis 12/06/2017   Gait abnormality 11/22/2017   UTI (urinary tract infection) 04/22/2017   Acute lower UTI 04/22/2017   Tremor 04/22/2017   Catheter-associated urinary tract infection (HCC) 09/29/2012   Nausea 09/29/2012   Fever 09/29/2012   Uncontrolled hypertension 09/29/2012   H/O: CVA (cerebrovascular accident) 09/29/2012   Hearing loss 09/29/2012   PCP:  Clinic, [facility] Pharmacy:   [facility] - [place], [place] - [address] [place] [address] Phone: [phone] Fax: [phone]     Social Drivers of Health (SDOH) Social History: SDOH Screenings   Food Insecurity: No Food Insecurity (04/11/2024)  Housing: Low Risk  (04/11/2024)  Transportation Needs: No Transportation Needs (04/12/2024)  Recent Concern: Transportation Needs - Unmet Transportation Needs (01/22/2024)  Utilities: At Risk (04/11/2024)  Depression (PHQ2-9): Low Risk  (01/22/2022)  Financial Resource Strain: Medium Risk (02/20/2020)  Social Connections: Socially Isolated (03/31/2024)  Tobacco Use: Medium Risk (04/11/2024)   SDOH Interventions: Utilities Interventions: Patient Declined Social Connections Interventions: Community Resources Provided, Inpatient TOC   Readmission Risk Interventions    04/02/2024    1:12 PM 04/02/2024   11:55 AM 02/11/2022   10:35 AM  Readmission Risk Prevention Plan  Transportation Screening Complete Complete Complete  PCP or Specialist Appt within 5-7 Days Complete Complete Complete  Home Care Screening Complete Complete Complete  Medication Review (RN CM) Complete Complete Complete

## 2024-04-12 NOTE — Discharge Instructions (Signed)

## 2024-04-12 NOTE — Plan of Care (Signed)
  Problem: Fluid Volume: Goal: Ability to maintain a balanced intake and output will improve Outcome: Progressing   Problem: Activity: Goal: Risk for activity intolerance will decrease Outcome: Not Progressing   Problem: Nutrition: Goal: Adequate nutrition will be maintained Outcome: Progressing   Problem: Coping: Goal: Level of anxiety will decrease Outcome: Progressing   Problem: Elimination: Goal: Will not experience complications related to bowel motility Outcome: Not Progressing Goal: Will not experience complications related to urinary retention Outcome: Progressing   Problem: Pain Managment: Goal: General experience of comfort will improve and/or be controlled Outcome: Progressing   Problem: Safety: Goal: Ability to remain free from injury will improve Outcome: Progressing   Problem: Skin Integrity: Goal: Risk for impaired skin integrity will decrease Outcome: Not Progressing

## 2024-04-12 NOTE — Progress Notes (Signed)
 Subjective: Met with patient and his wife in the room again today.  Thankfully his urine had cleared significantly.  No clots in tubing and lightly tinged with old blood  Objective: Vital signs in last 24 hours: Temp:  [98.5 F (36.9 C)-99.1 F (37.3 C)] 98.5 F (36.9 C) (06/18 1111) Pulse Rate:  [88-99] 99 (06/18 0500) Resp:  [21-26] 23 (06/18 1111) BP: (123-146)/(68-88) 136/80 (06/18 1111) SpO2:  [94 %-97 %] 94 % (06/18 0500) Weight:  [100.9 kg-104.8 kg] 104.8 kg (06/18 0612)  Assessment/Plan: #gross hematuria # Bladder diverticulum #neurogenic bladder   52f foley in urethra.  CBI yesterday through SPT.  CBI clamped on rounds today, unclear for how long.  Urine is clear tan, lightly blood-tinged.  No clots appreciated in tubing.   Continue to hold Eliquis  if medically reasonable.   If clear tomorrow will remove Foley catheter and have SPT placed back to gravity drainage.   Urology will follow.  Intake/Output from previous day: 06/17 0701 - 06/18 0700 In: -  Out: 4250 [Urine:4250]  Intake/Output this shift: Total I/O In: 120 [P.O.:120] Out: 375 [Urine:375]  Physical Exam:  General: Alert and oriented CV: No cyanosis Lungs: equal chest rise Gu: 46F Foley catheter in place draining clear light tan urine, SPT connected to clamped CBI.  Lab Results: Recent Labs    04/11/24 0150 04/11/24 0428 04/12/24 0249  HGB 13.3 13.6 12.1*  HCT 40.9 41.9 37.9*   BMET Recent Labs    04/11/24 0428 04/12/24 0249  NA 137 136  K 3.9 3.9  CL 104 105  CO2 24 26  GLUCOSE 147* 142*  BUN 15 13  CREATININE 1.02 1.12  CALCIUM 8.6* 8.2*  HGB 13.6 12.1*  WBC 10.6* 8.7     Studies/Results: CT Renal Stone Study Result Date: 04/10/2024 CLINICAL DATA:  Hematuria. EXAM: CT ABDOMEN AND PELVIS WITHOUT CONTRAST TECHNIQUE: Multidetector CT imaging of the abdomen and pelvis was performed following the standard protocol without IV contrast. RADIATION DOSE REDUCTION: This exam  was performed according to the departmental dose-optimization program which includes automated exposure control, adjustment of the mA and/or kV according to patient size and/or use of iterative reconstruction technique. COMPARISON:  September 15, 2023 FINDINGS: Lower chest: No acute abnormality. Hepatobiliary: No focal liver abnormality is seen. Status post cholecystectomy. No biliary dilatation. Pancreas: Unremarkable. No pancreatic ductal dilatation or surrounding inflammatory changes. Spleen: Normal in size without focal abnormality. Adrenals/Urinary Tract: Adrenal glands are unremarkable. Kidneys are normal, without renal calculi or focal lesions. There is mild to moderate severity bilateral hydronephrosis and hydroureter. A suprapubic catheter is seen within the urinary bladder. Mild diffuse urinary bladder wall thickening is noted. Multiple large urinary bladder diverticula are seen, some of which contain a small amount of air. A 5.6 cm x 5.9 cm x 7.0 cm area of heterogeneous increased attenuation (approximately 66.80 Hounsfield units) is seen throughout the bladder lumen. Stomach/Bowel: Stomach is within normal limits. Appendix appears normal. No evidence of bowel wall thickening, distention, or inflammatory changes. Noninflamed diverticula are seen throughout the descending and sigmoid colon. Vascular/Lymphatic: Aortic atherosclerosis. An inferior vena cava filter is in place. No enlarged abdominal or pelvic lymph nodes. Reproductive: Brachytherapy seeds are seen within an enlarged prostate gland. Other: No abdominal wall hernia or abnormality. No abdominopelvic ascites. Musculoskeletal: Multilevel degenerative changes are seen throughout the lumbar spine. IMPRESSION: 1. Findings consistent with a large amount of blood products within the urinary bladder lumen. Further evaluation with cystoscopy is recommended. 2.  Multiple large urinary bladder diverticula, some of which contain a small amount of air. 3. Mild  to moderate severity bilateral hydronephrosis and hydroureter. 4. Colonic diverticulosis. 5. Evidence of prior cholecystectomy. 6. Brachytherapy seeds within an enlarged prostate gland. Correlation with PSA levels is recommended. 7. Aortic atherosclerosis. Electronically Signed   By: Virgle Grime M.D.   On: 04/10/2024 20:55   DG Chest Port 1 View Result Date: 04/10/2024 CLINICAL DATA:  Possible sepsis. EXAM: PORTABLE CHEST 1 VIEW COMPARISON:  03/31/2024. FINDINGS: The heart is enlarged mediastinal contours within normal limits. There is atherosclerotic calcification of the aorta. The pulmonary vasculature is distended. Chronic elevation of the right diaphragm is noted with mild airspace disease at the lung bases. No effusion or pneumothorax is seen. No acute osseous abnormality. IMPRESSION: 1. Cardiomegaly with pulmonary vascular congestion. 2. Mild airspace disease at the lung bases, possible atelectasis, edema, or infiltrate. Electronically Signed   By: Wyvonnia Heimlich M.D.   On: 04/10/2024 19:56      LOS: 2 days   Alla Ar, NP [facility] Pager: [phone]  04/12/2024, 2:45 PM

## 2024-04-13 DIAGNOSIS — R310 Gross hematuria: Secondary | ICD-10-CM | POA: Diagnosis not present

## 2024-04-13 LAB — CULTURE, OB URINE: Culture: 10000 — AB

## 2024-04-13 LAB — GLUCOSE, CAPILLARY
Glucose-Capillary: 147 mg/dL — ABNORMAL HIGH (ref 70–99)
Glucose-Capillary: 203 mg/dL — ABNORMAL HIGH (ref 70–99)
Glucose-Capillary: 181 mg/dL — ABNORMAL HIGH (ref 70–99)
Glucose-Capillary: 145 mg/dL — ABNORMAL HIGH (ref 70–99)

## 2024-04-13 MED ORDER — CIPROFLOXACIN HCL 500 MG PO TABS
500.0000 mg | ORAL_TABLET | Freq: Two times a day (BID) | ORAL | Status: DC
  Administered 2024-04-13 – 2024-04-14 (×3): 500 mg via ORAL
  Filled 2024-04-13 (×4): qty 1

## 2024-04-13 NOTE — Progress Notes (Signed)
 PROGRESS NOTE    Robert [name]  WUJ:[id number] DOB: 01/29/1946 DOA: 04/10/2024 PCP: [facility]   Brief Narrative:   78 year old male with history of Parkinson's dementia, chronic urinary retention, recurrent ESBL UTI, CVA with left-sided weakness, DVT on Eliquis , HFpEF, DM, presenting with gross hematuria subsequent to IR placed suprapubic catheter. Urology is following, has a foley in place. Held eliquis .Hematuria resolved.  Assessment & Plan:  Principal Problem:   Gross hematuria Active Problems:   DM2 (diabetes mellitus, type 2) (HCC)   HLD (hyperlipidemia)   History of DVT (deep vein thrombosis)   Parkinson's disease (HCC)   SIRS (systemic inflammatory response syndrome) (HCC)   Chronic diastolic heart failure (HCC)   Acute gross hematuria: Resolved now - Secondary to suprapubic catheter placement and concurrent Eliquis  use.  Evaluated by urology, following closely.  Foley placed with intermittent flushing/evacuation recommended.  No need for CBI at this time.  Holding Eliquis . Foley's catheter will likely be removed today.   Chronic urinary obstruction with hydronephrosis - Indication for suprapubic catheter.  CT noting hydronephrosis, bladder diverticula suggestive of chronic obstruction.   History of ESBL UTI - Multiple urinary tract infections in past.  Patient noting fever 100.6 on presentation but afebrile currently.  UA contaminated by blood.  Will continue empiric meropenem  for now.   Concern for sepsis: resolved - Tachycardia, fever, mildly elevated lactate on presentation.  Blood cultures-NGTD, IV fluid hydration, empiric antibiotics started, dced IV Merrem  on 6/19, started on oral cipro  for Klebsiella UTI.  Continue to monitor blood pressure closely.   Parkinson's dementia - continue with sinemet  - History of DVT - Holding Eliquis  for the time being.   HFpEF,POA: NYHA III, not in exacerbation. - Euvolemic at this time.  Monitor urine output.    Diabetes mellitus type 2 - Placed on insulin  sliding scale.  Disposition: HHPT. Lives with his wife.   DVT prophylaxis: SCDs Start: 04/10/24 2207     Code Status: Full Code Family Communication:   Status is: Inpatient Remains inpatient appropriate because: hematuria    Subjective: No acute events overnight.  Patient's wife was present at the bedside.  He did have bowel movement yesterday so his abdominal discomfort has resolved.   Examination:  General exam: Appears calm and comfortable  Respiratory system: Clear to auscultation. Respiratory effort normal. Cardiovascular system: S1 & S2 heard, RRR. No JVD, murmurs, rubs, gallops or clicks. No pedal edema. Gastrointestinal system: Abdomen is nondistended, soft and nontender. No organomegaly or masses felt. Normal bowel sounds heard. Central nervous system: Alert and oriented. No focal neurological deficits. Extremities: Symmetric 5 x 5 power. Skin: No rashes, lesions or ulcers Psychiatry: Judgement and insight appear normal. Mood & affect appropriate. Genitourinary: Foley catheter in place, suprapubic catheter in place     Diet Orders (From admission, onward)     Start     Ordered   04/12/24 1702  DIET DYS 3 Room service appropriate? Yes; Fluid consistency: Thin  Diet effective now       Question Answer Comment  Room service appropriate? Yes   Fluid consistency: Thin      04/12/24 1701            Objective: Vitals:   04/12/24 2020 04/12/24 2230 04/13/24 0552 04/13/24 1152  BP: (!) 141/73 136/75 (!) 152/79 124/74  Pulse: 89 88 96 93  Resp: 17 20 17    Temp: 98.8 F (37.1 C) 99 F (37.2 C) 99.1 F (37.3 C) 98.8 F (37.1 C)  TempSrc:  Oral Oral Oral Oral  SpO2: 95% 95% 94% 94%  Weight:   104.4 kg   Height:        Intake/Output Summary (Last 24 hours) at 04/13/2024 1227 Last data filed at 04/13/2024 1154 Gross per 24 hour  Intake 480 ml  Output 1175 ml  Net -695 ml   Filed Weights   04/11/24 1600  04/12/24 0612 04/13/24 0552  Weight: 100.9 kg 104.8 kg 104.4 kg    Scheduled Meds:  busPIRone   10 mg Oral q morning   And   busPIRone   5 mg Oral 2 times per day   carbidopa -levodopa   2 tablet Oral QPC supper   carbidopa -levodopa   3 tablet Oral 2 times per day   Chlorhexidine  Gluconate Cloth  6 each Topical Daily   ciprofloxacin   500 mg Oral BID   memantine   20 mg Oral Daily   pantoprazole   40 mg Oral Daily   Pimavanserin  Tartrate  34 mg Oral Daily   psyllium  1 packet Oral Daily   rasagiline   1 mg Oral Daily   senna-docusate  1 tablet Oral BID   simvastatin   10 mg Oral QHS   Continuous Infusions:    Nutritional status     Body mass index is 28.02 kg/m.  Data Reviewed:   CBC: Recent Labs  Lab 04/10/24 0842 04/10/24 1928 04/11/24 0150 04/11/24 0428 04/12/24 0249  WBC 6.2 10.5  --  10.6* 8.7  NEUTROABS  --  8.1*  --  7.5  --   HGB 14.3 14.4 13.3 13.6 12.1*  HCT 45.8 44.5 40.9 41.9 37.9*  MCV 93.5 91.6  --  90.9 91.1  PLT 317 312  --  330 266   Basic Metabolic Panel: Recent Labs  Lab 04/10/24 1928 04/11/24 0428 04/12/24 0249  NA 137 137 136  K 4.2 3.9 3.9  CL 100 104 105  CO2 26 24 26   GLUCOSE 156* 147* 142*  BUN 15 15 13   CREATININE 1.11 1.02 1.12  CALCIUM 9.3 8.6* 8.2*  MG 1.7 1.7  --    GFR: Estimated Creatinine Clearance: 74.4 mL/min (by C-G formula based on SCr of 1.12 mg/dL). Liver Function Tests: Recent Labs  Lab 04/11/24 0428  AST 11*  ALT 10  ALKPHOS 62  BILITOT 1.0  PROT 6.3*  ALBUMIN 2.9*   No results for input(s): LIPASE, AMYLASE in the last 168 hours. No results for input(s): AMMONIA in the last 168 hours. Coagulation Profile: Recent Labs  Lab 04/10/24 0842 04/10/24 2038  INR 1.1 1.1   Cardiac Enzymes: No results for input(s): CKTOTAL, CKMB, CKMBINDEX, TROPONINI in the last 168 hours. BNP (last 3 results) No results for input(s): PROBNP in the last 8760 hours. HbA1C: No results for input(s): HGBA1C in  the last 72 hours. CBG: Recent Labs  Lab 04/12/24 1144 04/12/24 1709 04/12/24 2115 04/13/24 0549 04/13/24 1120  GLUCAP 154* 164* 188* 147* 203*   Lipid Profile: No results for input(s): CHOL, HDL, LDLCALC, TRIG, CHOLHDL, LDLDIRECT in the last 72 hours. Thyroid  Function Tests: No results for input(s): TSH, T4TOTAL, FREET4, T3FREE, THYROIDAB in the last 72 hours. Anemia Panel: No results for input(s): VITAMINB12, FOLATE, FERRITIN, TIBC, IRON, RETICCTPCT in the last 72 hours. Sepsis Labs: Recent Labs  Lab 04/10/24 2047 04/11/24 0428  PROCALCITON  --  <0.10  LATICACIDVEN 1.6  --     Recent Results (from the past 240 hours)  Blood culture (routine x 2)     Status: None (Preliminary result)  Collection Time: 04/10/24  8:38 PM   Specimen: BLOOD RIGHT ARM  Result Value Ref Range Status   Specimen Description BLOOD RIGHT ARM  Final   Special Requests   Final    BOTTLES DRAWN AEROBIC AND ANAEROBIC Blood Culture adequate volume   Culture   Final    NO GROWTH 3 DAYS Performed at [facility], [address] [place], [place] [address]    Report Status PENDING  Incomplete  Blood culture (routine x 2)     Status: None (Preliminary result)   Collection Time: 04/10/24  8:38 PM   Specimen: BLOOD RIGHT ARM  Result Value Ref Range Status   Specimen Description BLOOD RIGHT ARM  Final   Special Requests   Final    BOTTLES DRAWN AEROBIC AND ANAEROBIC Blood Culture adequate volume   Culture   Final    NO GROWTH 3 DAYS Performed at [facility], [address], [place], [place] [address]    Report Status PENDING  Incomplete  Resp panel by RT-PCR (RSV, Flu A&B, Covid)     Status: None   Collection Time: 04/10/24  8:38 PM   Specimen: Nasal Swab  Result Value Ref Range Status   SARS Coronavirus 2 by RT PCR NEGATIVE NEGATIVE Final   Influenza A by PCR NEGATIVE NEGATIVE Final   Influenza B by PCR NEGATIVE NEGATIVE Final    Comment:  (NOTE) The Xpert Xpress SARS-CoV-2/FLU/RSV plus assay is intended as an aid in the diagnosis of influenza from Nasopharyngeal swab specimens and should not be used as a sole basis for treatment. Nasal washings and aspirates are unacceptable for Xpert Xpress SARS-CoV-2/FLU/RSV testing.  Fact Sheet for Patients: BloggerCourse.com  Fact Sheet for Healthcare Providers: SeriousBroker.it  This test is not yet approved or cleared by the [place]  FDA and has been authorized for detection and/or diagnosis of SARS-CoV-2 by FDA under an Emergency Use Authorization (EUA). This EUA will remain in effect (meaning this test can be used) for the duration of the COVID-19 declaration under Section 564(b)(1) of the Act, 21 U.S.C. section 360bbb-3(b)(1), unless the authorization is terminated or revoked.     Resp Syncytial Virus by PCR NEGATIVE NEGATIVE Final    Comment: (NOTE) Fact Sheet for Patients: BloggerCourse.com  Fact Sheet for Healthcare Providers: SeriousBroker.it  This test is not yet approved or cleared by the [place]  FDA and has been authorized for detection and/or diagnosis of SARS-CoV-2 by FDA under an Emergency Use Authorization (EUA). This EUA will remain in effect (meaning this test can be used) for the duration of the COVID-19 declaration under Section 564(b)(1) of the Act, 21 U.S.C. section 360bbb-3(b)(1), unless the authorization is terminated or revoked.  Performed at [facility], [address] [place], [place] [address]   Culture, [place] Urine     Status: Abnormal   Collection Time: 04/10/24 10:29 PM   Specimen: Urine, Random  Result Value Ref Range Status   Specimen Description URINE, RANDOM  Final   Special Requests NONE  Final   Culture (A)  Final    10,000 COLONIES/mL KLEBSIELLA PNEUMONIAE NO GROUP B STREP (S.AGALACTIAE) ISOLATED Performed at [facility], [address], [place], [place] [address]    Report Status 04/13/2024 FINAL  Final   Organism ID, Bacteria KLEBSIELLA PNEUMONIAE (A)  Final      Susceptibility   Klebsiella pneumoniae - MIC*    AMPICILLIN >=32 RESISTANT Resistant     CEFAZOLIN  >=64  RESISTANT Resistant     CEFEPIME  <=0.12 SENSITIVE Sensitive     CEFTRIAXONE  32 RESISTANT Resistant     CIPROFLOXACIN  <=0.25 SENSITIVE Sensitive     GENTAMICIN <=1 SENSITIVE Sensitive     IMIPENEM 0.5 SENSITIVE Sensitive     NITROFURANTOIN 128 RESISTANT Resistant     TRIMETH /SULFA  >=320 RESISTANT Resistant     AMPICILLIN/SULBACTAM >=32 RESISTANT Resistant     PIP/TAZO >=128 RESISTANT Resistant ug/mL    * 10,000 COLONIES/mL KLEBSIELLA PNEUMONIAE  MRSA Next Gen by PCR, Nasal     Status: Abnormal   Collection Time: 04/11/24  4:09 PM   Specimen: Nasal Mucosa; Nasal Swab  Result Value Ref Range Status   MRSA by PCR Next Gen DETECTED (A) NOT DETECTED Final    Comment: RESULT CALLED TO, READ BACK BY AND VERIFIED WITH: RN JANAE ADAMS ON 04/11/24 @ 1949 BY DRT (NOTE) The GeneXpert MRSA Assay (FDA approved for NASAL specimens only), is one component of a comprehensive MRSA colonization surveillance program. It is not intended to diagnose MRSA infection nor to guide or monitor treatment for MRSA infections. Test performance is not FDA approved in patients less than 60 years old. Performed at [facility], [address], [place], [place] [address]          Radiology Studies: No results found.    LOS: 3 days   Time spent= 42 mins    Clancy Crimes, MD [facility]  If 7PM-7AM, please contact night-coverage  04/13/2024, 12:27 PM

## 2024-04-13 NOTE — Evaluation (Signed)
 Occupational Therapy Evaluation and Discharge Patient Details Name: Robert [name] MRN: [id number] DOB: May 21, 1946 Today's Date: 04/13/2024   History of Present Illness   Patient is a 78 year old male presenting with gross hematuria, requiring suprapubic catheter. PMH: Parkinson's dementia, chronic urinary retention, recurrent ESBL UTI, CVA with left-sided weakness, DVT on Eliquis , HFpEF, DM     Clinical Impressions Pt has excellent care of his wife and caregiver at home. All DME needs are met. Pt is able to self feed with set up, he is otherwise dependent in self care. Pt at his baseline in mobility and ADLs. No acute OT needs, recommend resuming HHOT.     If plan is discharge home, recommend the following:   Two people to help with walking and/or transfers;Two people to help with bathing/dressing/bathroom;Assistance with cooking/housework;Assistance with feeding;Direct supervision/assist for medications management;Direct supervision/assist for financial management;Assist for transportation;Help with stairs or ramp for entrance     Functional Status Assessment   Patient has had a recent decline in their functional status and/or demonstrates limited ability to make significant improvements in function in a reasonable and predictable amount of time     Equipment Recommendations   None recommended by OT     Recommendations for Other Services         Precautions/Restrictions   Precautions Precautions: Fall Required Braces or Orthoses: Other Brace Other Brace: L AFO/shoe Restrictions Weight Bearing Restrictions Per Provider Order: No     Mobility Bed Mobility Overal bed mobility: Needs Assistance Bed Mobility: Supine to Sit, Sit to Supine     Supine to sit: Max assist, +2 for physical assistance, HOB elevated, Used rails Sit to supine: Total assist, +2 for physical assistance   General bed mobility comments: cues for sequencing and technique. assistance for LE  and trunk support. increased time and effort required    Transfers                   General transfer comment: not attempted as family reports STEDY is too narrow. they use a sit to stand lift for all OOB activity and patient requires +2 person assistance at baseline      Balance Overall balance assessment: Needs assistance   Sitting balance-Leahy Scale: Poor   Postural control: Right lateral lean                                 ADL either performed or assessed with clinical judgement   ADL Overall ADL's : At baseline                                             Vision Ability to See in Adequate Light: 0 Adequate Patient Visual Report: No change from baseline       Perception         Praxis         Pertinent Vitals/Pain Pain Assessment Pain Assessment: No/denies pain     Extremity/Trunk Assessment Upper Extremity Assessment Upper Extremity Assessment: Right hand dominant;LUE deficits/detail;RUE deficits/detail RUE Deficits / Details: proximal weakness LUE Deficits / Details: hemiplegia with closed hand, has splints at home, HHOT is addressing LUE Coordination: decreased fine motor;decreased gross motor   Lower Extremity Assessment Lower Extremity Assessment: Defer to PT evaluation LLE Deficits / Details: increased tone with PROM. residual hemiparesis from  prior stroke. AFO in the room   Cervical / Trunk Assessment Cervical / Trunk Assessment: Other exceptions Cervical / Trunk Exceptions: weakness   Communication Communication Communication: Impaired Factors Affecting Communication: Hearing impaired   Cognition Arousal: Alert Behavior During Therapy: Flat affect, WFL for tasks assessed/performed Cognition: History of cognitive impairments                                 Following commands impaired: Follows one step commands with increased time     Cueing  General Comments   Cueing Techniques:  Verbal cues;Tactile cues  encouraged chair position of the bed to promote upright conditioning and for skin integrity. family report patient has a hospital bed at home   Exercises     Shoulder Instructions      Home Living Family/patient expects to be discharged to:: Private residence Living Arrangements: Spouse/significant other Available Help at Discharge: Family;Personal care attendant Type of Home: Apartment Home Access: Level entry     Home Layout: One level     Bathroom Shower/Tub: Tub/shower unit;Curtain   Firefighter: Handicapped height Bathroom Accessibility: Yes   Home Equipment: Grab bars - tub/shower;Hand held shower head;Transport chair;Hospital bed (lift to stand chair, hemiwalker)   Additional Comments: has PT, OT, and paid caregiver      Prior Functioning/Environment Prior Level of Function : Needs assist             Mobility Comments: non ambulatory. caregiver and spouse use sit to stand lift (requires +2 person) for all transfers. patient gets up to a chair for several hours a day. ADLs Comments: can feed himself with set-up, requires assistance for all other ADLs    OT Problem List:     OT Treatment/Interventions:        OT Goals(Current goals can be found in the care plan section)       OT Frequency:       Co-evaluation PT/OT/SLP Co-Evaluation/Treatment: Yes Reason for Co-Treatment: Complexity of the patient's impairments (multi-system involvement) PT goals addressed during session: Mobility/safety with mobility OT goals addressed during session: ADL's and self-care      AM-PAC OT 6 Clicks Daily Activity     Outcome Measure Help from another person eating meals?: A Little Help from another person taking care of personal grooming?: Total Help from another person toileting, which includes using toliet, bedpan, or urinal?: Total Help from another person bathing (including washing, rinsing, drying)?: Total Help from another  person to put on and taking off regular upper body clothing?: Total Help from another person to put on and taking off regular lower body clothing?: Total 6 Click Score: 8   End of Session Nurse Communication: Mobility status  Activity Tolerance: Patient tolerated treatment well Patient left: in bed;with call bell/phone within reach;Other (comment);with family/visitor present (bed in chair position)  OT Visit Diagnosis: Hemiplegia and hemiparesis;Muscle weakness (generalized) (M62.81);Other symptoms and signs involving cognitive function Hemiplegia - Right/Left: Left Hemiplegia - dominant/non-dominant: Non-Dominant Hemiplegia - caused by: Cerebral infarction                Time: 4098-1191 OT Time Calculation (min): 26 min Charges:  OT General Charges $OT Visit: 1 Visit OT Evaluation $OT Eval Moderate Complexity: 1 Mod  Avanell Leigh, OTR/L Acute Rehabilitation Services Office: [phone]  Jonette Nestle 04/13/2024, 11:18 AM

## 2024-04-13 NOTE — TOC Progression Note (Signed)
 Transition of Care [facility]) - Progression Note    Patient Details  Name: Robert [name] MRN: [id number] Date of Birth: 19-Apr-1946  [facility]) CM/SW Contact  Jeani Mill, RN Phone Number: 04/13/2024, 3:22 PM  Clinical Narrative:     Orders for home health. [name] with [facility] can accept referral.   Expected Discharge Plan: Home w Home Health Services Barriers to Discharge: Continued Medical Work up  Expected Discharge Plan and Services   Discharge Planning Services: CM Consult Post Acute Care Choice: Home Health Living arrangements for the past 2 months: Apartment                           HH Arranged: PT, OT HH Agency: [facility] Date HH Agency Contacted: 04/12/24 Time HH Agency Contacted: [phone] Representative spoke with at [facility] Agency: Engineer, materials   Social Determinants of Health (SDOH) Interventions SDOH Screenings   Food Insecurity: No Food Insecurity (04/11/2024)  Housing: Low Risk  (04/11/2024)  Transportation Needs: No Transportation Needs (04/12/2024)  Recent Concern: Transportation Needs - Unmet Transportation Needs (01/22/2024)  Utilities: Not At Risk (04/11/2024)  Recent Concern: Utilities - At Risk (04/11/2024)  Depression (PHQ2-9): Low Risk  (01/22/2022)  Financial Resource Strain: Medium Risk (02/20/2020)  Social Connections: Socially Isolated (04/11/2024)  Tobacco Use: Medium Risk (04/11/2024)    Readmission Risk Interventions    04/02/2024    1:12 PM 04/02/2024   11:55 AM 02/11/2022   10:35 AM  Readmission Risk Prevention Plan  Transportation Screening Complete Complete Complete  PCP or Specialist Appt within 5-7 Days Complete Complete Complete  Home Care Screening Complete Complete Complete  Medication Review (RN CM) Complete Complete Complete

## 2024-04-13 NOTE — Evaluation (Signed)
 Physical Therapy Evaluation and Discharge  Patient Details Name: Robert [name] MRN: [id number] DOB: 1946/08/05 Today's Date: 04/13/2024  History of Present Illness  Patient is a 78 year old male presenting with gross hematuria, requiring suprapubic catheter. PMH: Parkinson's dementia, chronic urinary retention, recurrent ESBL UTI, CVA with left-sided weakness, DVT on Eliquis , HFpEF, DM  Clinical Impression  The patient is cooperative and agreeable to mobilize with PT and OT. The patient has supportive spouse and caregiver support at home. Extensive DME in place. Patient requires +2 person assistance with mobility at baseline, using a sit to stand lift for transfers at home. He is non ambulatory at baseline.  Today the patient requires +2 person assistance for bed mobility. Sitting balance is poor initially with improved balance with increased sitting time. Encouraged chair position of bed to promote upright conditioning and for skin integrity. The spouse is hopeful for discharge home soon. The patient appears to be at his baseline level of functional independence. Recommend to resume PT at home with continued family and caregiver support. No acute PT needs at this time.       If plan is discharge home, recommend the following: Two people to help with walking and/or transfers;A lot of help with bathing/dressing/bathroom;Assistance with cooking/housework;Direct supervision/assist for medications management;Assist for transportation;Help with stairs or ramp for entrance   Can travel by private vehicle        Equipment Recommendations None recommended by PT  Recommendations for Other Services       Functional Status Assessment Patient has not had a recent decline in their functional status     Precautions / Restrictions Precautions Precautions: Fall Recall of Precautions/Restrictions: Intact Precaution/Restrictions Comments: chronic weakness left side Restrictions Weight Bearing  Restrictions Per Provider Order: No      Mobility  Bed Mobility Overal bed mobility: Needs Assistance Bed Mobility: Supine to Sit, Sit to Supine     Supine to sit: Max assist, +2 for physical assistance, HOB elevated, Used rails Sit to supine: Total assist, +2 for physical assistance   General bed mobility comments: cues for sequencing and technique. assistance for LE and trunk support. increased time and effort required    Transfers                   General transfer comment: not attempted as family reports STEDY is too narrow. they use a sit to stand lift for all OOB activity and patient requires +2 person assistance at baseline    Ambulation/Gait               General Gait Details: non ambulatory at baseline  Stairs            Wheelchair Mobility     Tilt Bed    Modified Rankin (Stroke Patients Only)       Balance Overall balance assessment: Needs assistance Sitting-balance support: Feet supported Sitting balance-Leahy Scale: Poor Sitting balance - Comments: external support required initially with improving balance with increased sitting time. Postural control: Right lateral lean                                   Pertinent Vitals/Pain Pain Assessment Pain Assessment: No/denies pain    Home Living Family/patient expects to be discharged to:: Private residence Living Arrangements: Spouse/significant other Available Help at Discharge: Family;Personal care attendant Type of Home: Apartment Home Access: Level entry       Home Layout:  One level Home Equipment: Grab bars - tub/shower;Hand held shower head;Transport chair;Hospital bed (sit to stand lift, hemiwalker, AFO LLE) Additional Comments: has PT, OT, and paid caregiver    Prior Function Prior Level of Function : Needs assist             Mobility Comments: non ambulatory. caregiver and spouse use sit to stand lift (requires +2 person) for all transfers. patient  gets up to a chair for several hours a day. ADLs Comments: can feed himself with set-up, requires assistance for all other ADLs     Extremity/Trunk Assessment   Upper Extremity Assessment Upper Extremity Assessment: Defer to OT evaluation    Lower Extremity Assessment Lower Extremity Assessment: Generalized weakness LLE Deficits / Details: increased tone with PROM. 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 AFO in the room       Communication   Communication Communication: Impaired Factors Affecting Communication: Hearing impaired    Cognition Arousal: Alert Behavior During Therapy: Flat affect, WFL for tasks assessed/performed   PT - Cognitive impairments: History of cognitive impairments                         Following commands: Impaired Following commands impaired: Follows one step commands with increased time     Cueing Cueing Techniques: Verbal cues, Tactile cues     General Comments General comments (skin integrity, edema, etc.): encouraged chair position of the bed to promote upright conditioning and for skin integrity. family report patient has a hospital bed at home    Exercises     Assessment/Plan    PT Assessment All further PT needs can be met in the next venue of care  PT Problem List Decreased strength;Decreased range of motion;Decreased activity tolerance;Decreased balance;Decreased mobility       PT Treatment Interventions      PT Goals (Current goals can be found in the Care Plan section)  Acute Rehab PT Goals PT Goal Formulation: All assessment and education complete, DC therapy    Frequency       Co-evaluation PT/OT/SLP Co-Evaluation/Treatment: Yes Reason for Co-Treatment: Complexity of the patient's impairments (multi-system involvement);To address functional/ADL transfers PT goals addressed during session: Mobility/safety with mobility         AM-PAC PT 6 Clicks Mobility  Outcome Measure Help needed turning from your  back to your side while in a flat bed without using bedrails?: A Lot Help needed moving from lying on your back to sitting on the side of a flat bed without using bedrails?: Total Help needed moving to and from a bed to a chair (including a wheelchair)?: Total Help needed standing up from a chair using your arms (e.g., wheelchair or bedside chair)?: Total Help needed to walk in hospital room?: Total Help needed climbing 3-5 steps with a railing? : Total 6 Click Score: 7    End of Session Equipment Utilized During Treatment:  (AFO LLE) Activity Tolerance: Patient tolerated treatment well Patient left: in bed;with call bell/phone within reach;with bed alarm set;with family/visitor present (spouse and personal caregiver present) Nurse Communication: Mobility status PT Visit Diagnosis: Muscle weakness (generalized) (M62.81);Difficulty in walking, not elsewhere classified (R26.2)    Time: 1610-9604 PT Time Calculation (min) (ACUTE ONLY): 26 min   Charges:   PT Evaluation $PT Eval Moderate Complexity: 1 Mod   PT General Charges $$ ACUTE PT VISIT: 1 Visit         Ozie Bo, PT, MPT   Erlene Hawks  04/13/2024, 10:08 AM

## 2024-04-13 NOTE — Progress Notes (Signed)
     Subjective: Met with patient, his wife, and daughter and reviewed suprapubic tube management and ongoing plan.  No acute events overnight.  Objective: Vital signs in last 24 hours: Temp:  [98.6 F (37 C)-99.1 F (37.3 C)] 98.8 F (37.1 C) (06/19 1152) Pulse Rate:  [85-96] 93 (06/19 1152) Resp:  [17-20] 17 (06/19 0552) BP: (124-152)/(70-79) 124/74 (06/19 1152) SpO2:  [94 %-96 %] 94 % (06/19 1152) Weight:  [104.4 kg] 104.4 kg (06/19 0552)  Assessment/Plan: # gross hematuria-resolved # Bladder diverticulum # neurogenic bladder   CBI off for over 24 hours.  Urine clear on rounds today.  Will have nursing remove 87f Foley catheter and obtain replacement lower lock SPT drainage bag from interventional radiology.   Continue to hold Eliquis  if medically reasonable.  Trial anticoagulation after clear for 24 hours.  If patient is going to be in hospital it would be reasonable to start with heparin  infusion before bridging to long-term treatment   Urology will sign off at this time.  Please feel free to call with questions or concerns.  Intake/Output from previous day: 06/18 0701 - 06/19 0700 In: 419.6 [P.O.:120; IV Piggyback:299.6] Out: 1550 [Urine:1550]  Intake/Output this shift: Total I/O In: 480 [P.O.:480] Out: -   Physical Exam:  General: Alert and oriented CV: No cyanosis Lungs: equal chest rise Gu: 17F Foley catheter in place draining clear light tan urine, SPT connected to clamped CBI.  Lab Results: Recent Labs    04/11/24 0150 04/11/24 0428 04/12/24 0249  HGB 13.3 13.6 12.1*  HCT 40.9 41.9 37.9*   BMET Recent Labs    04/11/24 0428 04/12/24 0249  NA 137 136  K 3.9 3.9  CL 104 105  CO2 24 26  GLUCOSE 147* 142*  BUN 15 13  CREATININE 1.02 1.12  CALCIUM 8.6* 8.2*  HGB 13.6 12.1*  WBC 10.6* 8.7     Studies/Results: No results found.     LOS: 3 days   Alla Ar, NP [facility] Pager: [phone]  04/13/2024,  2:49 PM

## 2024-04-14 ENCOUNTER — Other Ambulatory Visit (HOSPITAL_COMMUNITY): Payer: Self-pay

## 2024-04-14 DIAGNOSIS — R310 Gross hematuria: Secondary | ICD-10-CM | POA: Diagnosis not present

## 2024-04-14 LAB — GLUCOSE, CAPILLARY
Glucose-Capillary: 156 mg/dL — ABNORMAL HIGH (ref 70–99)
Glucose-Capillary: 156 mg/dL — ABNORMAL HIGH (ref 70–99)

## 2024-04-14 MED ORDER — CIPROFLOXACIN HCL 500 MG PO TABS: 500.0000 mg | ORAL_TABLET | Freq: Two times a day (BID) | ORAL | 0 refills | Status: DC

## 2024-04-14 MED ORDER — MUPIROCIN 2 % EX OINT
1.0000 | TOPICAL_OINTMENT | Freq: Two times a day (BID) | CUTANEOUS | Status: DC
Start: 2024-04-14 — End: 2024-04-14
  Administered 2024-04-14: 1 via NASAL
  Filled 2024-04-14: qty 22

## 2024-04-14 MED ORDER — CIPROFLOXACIN HCL 500 MG PO TABS
500.0000 mg | ORAL_TABLET | Freq: Two times a day (BID) | ORAL | 0 refills | Status: AC
  Filled 2024-04-14: qty 6, 3d supply, fill #0

## 2024-04-14 NOTE — TOC Transition Note (Signed)
 Transition of Care [facility]) - Discharge Note   Patient Details  Name: Robert [name] MRN: [id number] Date of Birth: 1946/05/13  [facility]) CM/SW Contact:  Jeani Mill, RN Phone Number: 04/14/2024, 12:41 PM   Clinical Narrative:    Patient is stable for discharge.  Notified [name] with [facility] of discharge.  [facility] called for transportation. Wife at bedside. Has all needed DME. No other TOC needs.   Final next level of care: Home w Home Health Services Barriers to Discharge: Barriers Resolved   Patient Goals and CMS Choice   CMS Medicare.gov Compare Post Acute Care list provided to:: Patient Represenative (must comment)   [place] ownership interest in [facility].provided to:: Spouse    Discharge Placement                     Home  Discharge Plan and Services Additional resources added to the After Visit Summary for     Discharge Planning Services: CM Consult Post Acute Care Choice: Home Health                    HH Arranged: PT, OT [facility] Agency: [facility] Date [facility] Agency Contacted: 04/12/24 Time HH Agency Contacted: [phone] Representative spoke with at [facility] Agency: Engineer, materials  Social Drivers of Health (SDOH) Interventions SDOH Screenings   Food Insecurity: No Food Insecurity (04/11/2024)  Housing: Low Risk  (04/11/2024)  Transportation Needs: No Transportation Needs (04/12/2024)  Recent Concern: Transportation Needs - Unmet Transportation Needs (01/22/2024)  Utilities: Not At Risk (04/11/2024)  Recent Concern: Utilities - At Risk (04/11/2024)  Depression (PHQ2-9): Low Risk  (01/22/2022)  Financial Resource Strain: Medium Risk (02/20/2020)  Social Connections: Socially Isolated (04/11/2024)  Tobacco Use: Medium Risk (04/11/2024)     Readmission Risk Interventions    04/14/2024   12:41 PM 04/14/2024   12:40 PM 04/02/2024    1:12 PM  Readmission Risk Prevention Plan  Transportation Screening Complete Complete Complete   PCP or Specialist Appt within 5-7 Days   Complete  PCP or Specialist Appt within 3-5 Days Complete Not Complete   Not Complete comments urology follow up in 2 weeks    Home Care Screening   Complete  Medication Review (RN CM)   Complete  HRI or Home Care Consult Complete    Social Work Consult for Recovery Care Planning/Counseling Complete    Palliative Care Screening Not Applicable    Medication Review [employer]) Complete

## 2024-04-14 NOTE — Progress Notes (Incomplete)
 Went over discharge paperwork with patient and wife. All questions

## 2024-04-14 NOTE — Discharge Summary (Signed)
 Physician Discharge Summary   Patient: Robert [name] MRN: [id number] DOB: 13-Feb-1946  Admit date:     04/10/2024  Discharge date: 04/14/24  Discharge Physician: Clancy Crimes   PCP: [facility]   Recommendations at discharge:    Follow up out patient with urology Continue taking meds as prescribed  Discharge Diagnoses: Principal Problem:   Gross hematuria Active Problems:   DM2 (diabetes mellitus, type 2) (HCC)   HLD (hyperlipidemia)   History of DVT (deep vein thrombosis)   Parkinson's disease (HCC)   SIRS (systemic inflammatory response syndrome) (HCC)   Chronic diastolic heart failure (HCC)  Resolved Problems:   * No resolved hospital problems. *  Hospital Course:   78 year old male with history of Parkinson's dementia, chronic urinary retention, recurrent ESBL UTI, CVA with left-sided weakness, DVT on Eliquis , HFpEF, DM, presenting with gross hematuria subsequent to IR placed suprapubic catheter. Urology consulted, and foley catheter was placed which was subsequently removed on 6/19. Hematuria resolved. Advised to resume taking aspirin  and eliquis  on 6/21 and out patient follow up with urology for suprapubic catheter management. Discussed with his wife at the bedside. Discharged on oral ciprofloxacin  for Klebsiella UTI. Discharged home with HHS.       Consultants: Urology Procedures performed: None  Disposition: Home health Diet recommendation:  Cardiac diet DISCHARGE MEDICATION: Allergies as of 04/14/2024       Reactions   Fluconazole Dermatitis, Rash   Propofol  Other (See Comments)   Hiccups for weeks   Lisinopril Cough   Fluoxetine Nausea Only, Other (See Comments)   Hallucinations, Feeling nervous, Nausea, Tachycardia also   Nystatin Rash   Sulfamethoxazole -trimethoprim  Diarrhea        Medication List     PAUSE taking these medications    Dupilumab 300 MG/2ML Soaj Wait to take this until your doctor or other care provider tells you  to start again. Inject 300 mg into the skin.       TAKE these medications    acetaminophen  500 MG tablet Commonly known as: TYLENOL  Take 1,000 mg by mouth every 6 (six) hours as needed for mild pain (pain score 1-3), moderate pain (pain score 4-6) or fever.   apixaban  5 MG Tabs tablet Commonly known as: ELIQUIS  Take 1 tablet (5 mg total) by mouth 2 (two) times daily.   ascorbic acid 250 MG tablet Commonly known as: VITAMIN C Take 250 mg by mouth in the morning and at bedtime.   aspirin  EC 81 MG tablet Take 81 mg by mouth daily. Swallow whole.   bacitracin 500 UNIT/GM ointment Apply 1 Application topically daily as needed for wound care (near catheter).   busPIRone  10 MG tablet Commonly known as: BUSPAR  Take 5-10 mg by mouth See admin instructions. Take 10 mg by mouth in the morning and 5 mg at noon & bedtime   Cholecalciferol 50 MCG (2000 UT) Tabs Take 2,000 Units by mouth daily.   ciprofloxacin  500 MG tablet Commonly known as: CIPRO  Take 1 tablet (500 mg total) by mouth 2 (two) times daily for 6 doses.   HYDROCORTISONE  ACE (RECTAL) 30 MG Supp Place 30 mg rectally at bedtime.   melatonin 3 MG Tabs tablet Take 6 mg by mouth at bedtime.   memantine  10 MG tablet Commonly known as: NAMENDA  Take 20 mg by mouth at bedtime.   metFORMIN  500 MG tablet Commonly known as: GLUCOPHAGE  Take 500 mg by mouth at bedtime.   methenamine 1 g tablet Commonly known as: MANDELAMINE Take 1,000 mg  by mouth See admin instructions. Crush 1,000 mg and place into applesauce- take morning and bedtime   miconazole 2 % powder Commonly known as: MICOTIN Apply 1 Application topically See admin instructions. Apply in the groin area once a day as needed   Nuplazid  34 MG Caps Generic drug: Pimavanserin  Tartrate Take 34 mg by mouth daily.   nystatin-triamcinolone ointment Commonly known as: MYCOLOG Apply 1 application  topically as needed (rash/yeast).   omeprazole 20 MG  capsule Commonly known as: PRILOSEC Take 20 mg by mouth daily before breakfast.   oxybutynin  5 MG tablet Commonly known as: DITROPAN  Take 2.5 mg by mouth in the morning and at bedtime.   psyllium 58.6 % powder Commonly known as: METAMUCIL See admin instructions. Mix 2 teaspoonsful of powder into 16 ounces of water and drink by mouth once a day   rasagiline  1 MG Tabs tablet Commonly known as: AZILECT  Take 1 mg by mouth daily.   senna-docusate 8.6-50 MG tablet Commonly known as: Senokot-S Take 2 tablets by mouth daily as needed for mild constipation or moderate constipation.   simvastatin  20 MG tablet Commonly known as: ZOCOR  Take 10 mg by mouth at bedtime.   Sinemet  25-100 MG tablet Generic drug: carbidopa -levodopa  Take 2-3 tablets by mouth See admin instructions. Take 3 tablets by mouth at 7 AM & 12 Noon and 2 tablets between 5-6 PM/after the evening meal   tacrolimus 0.1 % ointment Commonly known as: PROTOPIC Apply 1 application  topically See admin instructions. Apply to groin, armpits, and bottom 2 times a day when skin is broken out   triamcinolone cream 0.1 % Commonly known as: KENALOG Apply 1 Application topically daily as needed (rash).   zinc  oxide 20 % ointment Apply 1 Application topically See admin instructions. Every time there is a diaper change What changed: additional instructions        Follow-up Information     [facility]  Follow up.   Why: home health has been arranged. They will contact you to [facility] apt within 48hrs post discharge. Contact information: [address] [place] [address] [phone]         Robert Luster, MD Follow up in 2 week(s).   Specialty: Urology Contact information: [address] [place] [address] [phone]                Discharge Exam: Filed Weights   04/12/24 0612 04/13/24 0552 04/14/24 0458  Weight: 104.8 kg 104.4 kg 101.8 kg   Alert and awake, bed bound at  baseline. Suprapubic catheter in place..  Condition at discharge: good  The results of significant diagnostics from this hospitalization (including imaging, microbiology, ancillary and laboratory) are listed below for reference.   Imaging Studies: CT Renal Stone Study Result Date: 04/10/2024 CLINICAL DATA:  Hematuria. EXAM: CT ABDOMEN AND PELVIS WITHOUT CONTRAST TECHNIQUE: Multidetector CT imaging of the abdomen and pelvis was performed following the standard protocol without IV contrast. RADIATION DOSE REDUCTION: This exam was performed according to the departmental dose-optimization program which includes automated exposure control, adjustment of the mA and/or kV according to patient size and/or use of iterative reconstruction technique. COMPARISON:  September 15, 2023 FINDINGS: Lower chest: No acute abnormality. Hepatobiliary: No focal liver abnormality is seen. Status post cholecystectomy. No biliary dilatation. Pancreas: Unremarkable. No pancreatic ductal dilatation or surrounding inflammatory changes. Spleen: Normal in size without focal abnormality. Adrenals/Urinary Tract: Adrenal glands are unremarkable. Kidneys are normal, without renal calculi or focal lesions.  There is mild to moderate severity bilateral hydronephrosis and hydroureter. A suprapubic catheter is seen within the urinary bladder. Mild diffuse urinary bladder wall thickening is noted. Multiple large urinary bladder diverticula are seen, some of which contain a small amount of air. A 5.6 cm x 5.9 cm x 7.0 cm area of heterogeneous increased attenuation (approximately 66.80 Hounsfield units) is seen throughout the bladder lumen. Stomach/Bowel: Stomach is within normal limits. Appendix appears normal. No evidence of bowel wall thickening, distention, or inflammatory changes. Noninflamed diverticula are seen throughout the descending and sigmoid colon. Vascular/Lymphatic: Aortic atherosclerosis. An inferior vena cava filter is in place. No  enlarged abdominal or pelvic lymph nodes. Reproductive: Brachytherapy seeds are seen within an enlarged prostate gland. Other: No abdominal wall hernia or abnormality. No abdominopelvic ascites. Musculoskeletal: Multilevel degenerative changes are seen throughout the lumbar spine. IMPRESSION: 1. Findings consistent with a large amount of blood products within the urinary bladder lumen. Further evaluation with cystoscopy is recommended. 2. Multiple large urinary bladder diverticula, some of which contain a small amount of air. 3. Mild to moderate severity bilateral hydronephrosis and hydroureter. 4. Colonic diverticulosis. 5. Evidence of prior cholecystectomy. 6. Brachytherapy seeds within an enlarged prostate gland. Correlation with PSA levels is recommended. 7. Aortic atherosclerosis. Electronically Signed   By: Virgle Grime M.D.   On: 04/10/2024 20:55   DG Chest Port 1 View Result Date: 04/10/2024 CLINICAL DATA:  Possible sepsis. EXAM: PORTABLE CHEST 1 VIEW COMPARISON:  03/31/2024. FINDINGS: The heart is enlarged mediastinal contours within normal limits. There is atherosclerotic calcification of the aorta. The pulmonary vasculature is distended. Chronic elevation of the right diaphragm is noted with mild airspace disease at the lung bases. No effusion or pneumothorax is seen. No acute osseous abnormality. IMPRESSION: 1. Cardiomegaly with pulmonary vascular congestion. 2. Mild airspace disease at the lung bases, possible atelectasis, edema, or infiltrate. Electronically Signed   By: Wyvonnia Heimlich M.D.   On: 04/10/2024 19:56   CT GUIDED SUPERPUBIC CATHETER PLMT Result Date: 04/10/2024 INDICATION: Chronic urinary retention, prior stroke, Parkinson's disease and dependence on chronic indwelling Foley catheter. Need for suprapubic bladder drainage catheter placement. EXAM: CT GUIDED SUPRAPUBIC CATHETER PLACEMENT COMPARISON:  None Available. MEDICATIONS: 2 g IV Ancef ; The antibiotic was administered in an  appropriate time frame prior to skin puncture. ANESTHESIA/SEDATION: Moderate (conscious) sedation was employed during this procedure. A total of Versed  2.0 mg and Fentanyl  100 mcg was administered intravenously. Moderate Sedation Time: 26 minutes. The patient's level of consciousness and vital signs were monitored continuously by radiology nursing throughout the procedure under my direct supervision. CONTRAST:  None FLUOROSCOPY TIME:  Procedure done under CT guidance. COMPLICATIONS: None immediate. PROCEDURE: Informed written consent was obtained from the patient's wife after a thorough discussion of the procedural risks, benefits and alternatives. All questions were addressed. Maximal Sterile Barrier Technique was utilized including caps, mask, sterile gowns, sterile gloves, sterile drape, hand hygiene and skin antiseptic. A timeout was performed prior to the initiation of the procedure. CT was performed in a supine position through the pelvis after distension of the bladder with 250 mL of sterile water. After choosing a site for tube placement in the suprapubic region, the skin was prepped with chlorhexidine  and local anesthesia provided with 1% lidocaine . An 18 gauge trocar needle was advanced under CT guidance into the bladder. After return of urine, the needle was removed over a guidewire. The percutaneous tract was dilated over the wire and a 14 French pigtail drainage catheter advanced into  the bladder. Final catheter position was confirmed by CT. The drainage catheter was then attached to a gravity drainage bag. It was secured at the skin with a Prolene retention suture and adhesive StatLock device. After suprapubic catheter placement, an indwelling Foley catheter was removed. FINDINGS: After bladder distension, a suprapubic window was present under CT to the level of the bladder. A 14 French catheter was able to be placed into the bladder lumen with return of blood tinged urine after placement. IMPRESSION:  Successful placement of 14 French suprapubic bladder drainage catheter under CT guidance. After placement, a chronic indwelling Foley catheter was removed. The patient will be brought back in approximately 6 weeks for up sizing of the suprapubic catheter under fluoroscopy to a 16 [place] Foley catheter. Electronically Signed   By: Erica Hau M.D.   On: 04/10/2024 14:43   CT Head Wo Contrast Result Date: 03/31/2024 CLINICAL DATA:  Mental status change, unknown cause EXAM: CT HEAD WITHOUT CONTRAST TECHNIQUE: Contiguous axial images were obtained from the base of the skull through the vertex without intravenous contrast. RADIATION DOSE REDUCTION: This exam was performed according to the departmental dose-optimization program which includes automated exposure control, adjustment of the mA and/or kV according to patient size and/or use of iterative reconstruction technique. COMPARISON:  05/12/2021 FINDINGS: Brain: Large area of encephalomalacia involving the right frontal lobe is stable since prior study. There is atrophy and chronic small vessel disease changes. No acute intracranial abnormality. Specifically, no hemorrhage, hydrocephalus, mass lesion, acute infarction, or significant intracranial injury. Vascular: No hyperdense vessel or unexpected calcification. Skull: No acute calvarial abnormality. Sinuses/Orbits: No acute findings Other: None IMPRESSION: Atrophy, chronic microvascular disease. No acute intracranial abnormality. Stable large area of encephalomalacia in the right frontal lobe. Electronically Signed   By: Janeece Mechanic M.D.   On: 03/31/2024 17:20   DG Chest 1 View Result Date: 03/31/2024 CLINICAL DATA:  Urinary tract infection. EXAM: CHEST  1 VIEW COMPARISON:  01/22/2024 FINDINGS: The heart remains near the upper limit of normal in size. Tortuous and partially calcified thoracic aorta. Stable linear scarring at the left lung base. Otherwise, clear lungs. Stable prominent upper lung zone  pulmonary vasculature. No pleural fluid. Bilateral shoulder degenerative changes with superior migration of the humeral heads is again demonstrated. IMPRESSION: 1. No acute abnormality. 2. Stable borderline cardiomegaly and mild pulmonary vascular congestion. 3. Stable bilateral shoulder degenerative changes with evidence of chronic bilateral rotator cuff tears. Electronically Signed   By: Catherin Closs M.D.   On: 03/31/2024 15:34   VAS US  CAROTID Result Date: 03/21/2024 Carotid Arterial Duplex Study Patient Name:  Eulogio Requena  Date of Exam:   03/21/2024 Medical Rec #: [id number]       Accession #:    [id number] Date of Birth: 1946/01/06       Patient Gender: M Patient Age:   89 years Exam Location:  [name] Procedure:      VAS US  CAROTID Referring Phys: Jimmye Moulds --------------------------------------------------------------------------------  Indications:       Carotid artery disease. Risk Factors:      Hypertension, hyperlipidemia, prior CVA. Other Factors:     05/12/21: Left TCAR. Known right ICA occlusion. Limitations        Today's exam was limited due to Exam performed in WC. Comparison Study:  No change since prior exam of 03/16/2023 Performing Technologist: Homer Lust RVT  Examination Guidelines: A complete evaluation includes B-mode imaging, spectral Doppler, color Doppler, and power Doppler as needed of all  accessible portions of each vessel. Bilateral testing is considered an integral part of a complete examination. Limited examinations for reoccurring indications may be performed as noted.  Right Carotid Findings: +----------+--------+--------+--------+------------------+--------+           PSV cm/sEDV cm/sStenosisPlaque DescriptionComments +----------+--------+--------+--------+------------------+--------+ CCA Prox  65      0                                          +----------+--------+--------+--------+------------------+--------+ CCA Mid   83      5                                           +----------+--------+--------+--------+------------------+--------+ CCA Distal46      4                                          +----------+--------+--------+--------+------------------+--------+ ICA Prox                  Occluded                           +----------+--------+--------+--------+------------------+--------+ ICA Mid   96      96      Occluded                           +----------+--------+--------+--------+------------------+--------+ ICA Distal                Occluded                           +----------+--------+--------+--------+------------------+--------+ ECA       101     0                                          +----------+--------+--------+--------+------------------+--------+ +----------+--------+-------+----------------+-------------------+           PSV cm/sEDV cmsDescribe        Arm Pressure (mmHG) +----------+--------+-------+----------------+-------------------+ Subclavian122     2      Multiphasic, [id number]                 +----------+--------+-------+----------------+-------------------+ +---------+--------+--+--------+-+---------+ VertebralPSV cm/s34EDV cm/s9Antegrade +---------+--------+--+--------+-+---------+  Left Carotid Findings: +----------+--------+--------+--------+------------------+--------+           PSV cm/sEDV cm/sStenosisPlaque DescriptionComments +----------+--------+--------+--------+------------------+--------+ CCA Prox  135     16                                         +----------+--------+--------+--------+------------------+--------+ CCA Mid   93      13                                         +----------+--------+--------+--------+------------------+--------+ CCA Distal74      14                                         +----------+--------+--------+--------+------------------+--------+  ICA Prox                                            stent     +----------+--------+--------+--------+------------------+--------+ ICA Mid                                             stent    +----------+--------+--------+--------+------------------+--------+ ICA Distal79      18                                         +----------+--------+--------+--------+------------------+--------+ ECA       74      1                                          +----------+--------+--------+--------+------------------+--------+ +----------+--------+--------+----------------+-------------------+           PSV cm/sEDV cm/sDescribe        Arm Pressure (mmHG) +----------+--------+--------+----------------+-------------------+ Subclavian                Multiphasic, [id number]                 +----------+--------+--------+----------------+-------------------+ +---------+--------+--+--------+--+---------+ VertebralPSV cm/s51EDV cm/s12Antegrade +---------+--------+--+--------+--+---------+  Left Stent(s): +---------------+--+--++++ Prox to Stent  9562 +---------------+--+--++++ Proximal Stent 9317 +---------------+--+--++++ Mid Stent      8020 +---------------+--+--++++ Distal Stent   8321 +---------------+--+--++++ Distal to Stent8022 +---------------+--+--++++    Summary: Right Carotid: Evidence consistent with a total occlusion of the right ICA. Left Carotid: Patent stent with no evidence for restenosis. Vertebrals:  Bilateral vertebral arteries demonstrate antegrade flow. Subclavians: Normal flow hemodynamics were seen in bilateral subclavian              arteries. *See table(s) above for measurements and observations.  Electronically signed by Jimmye Moulds MD on 03/21/2024 at 11:10:19 AM.    Final     Microbiology: Results for orders placed or performed during the hospital encounter of 04/10/24  Blood culture (routine x 2)     Status: None (Preliminary result)   Collection Time: 04/10/24  8:38 PM   Specimen: BLOOD  RIGHT ARM  Result Value Ref Range Status   Specimen Description BLOOD RIGHT ARM  Final   Special Requests   Final    BOTTLES DRAWN AEROBIC AND ANAEROBIC Blood Culture adequate volume   Culture   Final    NO GROWTH 4 DAYS Performed at [facility], [address] [place], [place] [address]    Report Status PENDING  Incomplete  Blood culture (routine x 2)     Status: None (Preliminary result)   Collection Time: 04/10/24  8:38 PM   Specimen: BLOOD RIGHT ARM  Result Value Ref Range Status   Specimen Description BLOOD RIGHT ARM  Final   Special Requests   Final    BOTTLES DRAWN AEROBIC AND ANAEROBIC Blood Culture adequate volume   Culture   Final    NO GROWTH 4 DAYS Performed at [facility], [address], [place], [place] [address]    Report Status PENDING  Incomplete  Resp panel by RT-PCR (RSV,  Flu A&B, Covid)     Status: None   Collection Time: 04/10/24  8:38 PM   Specimen: Nasal Swab  Result Value Ref Range Status   SARS Coronavirus 2 by RT PCR NEGATIVE NEGATIVE Final   Influenza A by PCR NEGATIVE NEGATIVE Final   Influenza B by PCR NEGATIVE NEGATIVE Final    Comment: (NOTE) The Xpert Xpress SARS-CoV-2/FLU/RSV plus assay is intended as an aid in the diagnosis of influenza from Nasopharyngeal swab specimens and should not be used as a sole basis for treatment. Nasal washings and aspirates are unacceptable for Xpert Xpress SARS-CoV-2/FLU/RSV testing.  Fact Sheet for Patients: BloggerCourse.com  Fact Sheet for Healthcare Providers: SeriousBroker.it  This test is not yet approved or cleared by the [place]  FDA and has been authorized for detection and/or diagnosis of SARS-CoV-2 by FDA under an Emergency Use Authorization (EUA). This EUA will remain in effect (meaning this test can be used) for the duration of the COVID-19 declaration under Section 564(b)(1) of the Act, 21 U.S.C. section 360bbb-3(b)(1),  unless the authorization is terminated or revoked.     Resp Syncytial Virus by PCR NEGATIVE NEGATIVE Final    Comment: (NOTE) Fact Sheet for Patients: BloggerCourse.com  Fact Sheet for Healthcare Providers: SeriousBroker.it  This test is not yet approved or cleared by the [place]  FDA and has been authorized for detection and/or diagnosis of SARS-CoV-2 by FDA under an Emergency Use Authorization (EUA). This EUA will remain in effect (meaning this test can be used) for the duration of the COVID-19 declaration under Section 564(b)(1) of the Act, 21 U.S.C. section 360bbb-3(b)(1), unless the authorization is terminated or revoked.  Performed at [facility], [address], [place], [place] [address]   Culture, [place] Urine     Status: Abnormal   Collection Time: 04/10/24 10:29 PM   Specimen: Urine, Random  Result Value Ref Range Status   Specimen Description URINE, RANDOM  Final   Special Requests NONE  Final   Culture (A)  Final    10,000 COLONIES/mL KLEBSIELLA PNEUMONIAE NO GROUP B STREP (S.AGALACTIAE) ISOLATED Performed at [facility], [address], [place], [place] [address]    Report Status 04/13/2024 FINAL  Final   Organism ID, Bacteria KLEBSIELLA PNEUMONIAE (A)  Final      Susceptibility   Klebsiella pneumoniae - MIC*    AMPICILLIN >=32 RESISTANT Resistant     CEFAZOLIN  >=64 RESISTANT Resistant     CEFEPIME  <=0.12 SENSITIVE Sensitive     CEFTRIAXONE  32 RESISTANT Resistant     CIPROFLOXACIN  <=0.25 SENSITIVE Sensitive     GENTAMICIN <=1 SENSITIVE Sensitive     IMIPENEM 0.5 SENSITIVE Sensitive     NITROFURANTOIN 128 RESISTANT Resistant     TRIMETH /SULFA  >=320 RESISTANT Resistant     AMPICILLIN/SULBACTAM >=32 RESISTANT Resistant     PIP/TAZO >=128 RESISTANT Resistant ug/mL    * 10,000 COLONIES/mL KLEBSIELLA PNEUMONIAE  MRSA Next Gen by PCR, Nasal     Status: Abnormal   Collection Time: 04/11/24  4:09  PM   Specimen: Nasal Mucosa; Nasal Swab  Result Value Ref Range Status   MRSA by PCR Next Gen DETECTED (A) NOT DETECTED Final    Comment: RESULT CALLED TO, READ BACK BY AND VERIFIED WITH: RN JANAE ADAMS ON 04/11/24 @ 1949 BY DRT (NOTE) The GeneXpert MRSA Assay (FDA approved for NASAL specimens only), is one component of a comprehensive MRSA colonization surveillance program. It is not intended to diagnose MRSA infection nor to  guide or monitor treatment for MRSA infections. Test performance is not FDA approved in patients less than 75 years old. Performed at [facility], [address], [place], [place] [address]     Labs: CBC: Recent Labs  Lab 04/10/24 [phone] 04/10/24 1928 04/11/24 0150 04/11/24 0428 04/12/24 0249  WBC 6.2 10.5  --  10.6* 8.7  NEUTROABS  --  8.1*  --  7.5  --   HGB 14.3 14.4 13.3 13.6 12.1*  HCT 45.8 44.5 40.9 41.9 37.9*  MCV 93.5 91.6  --  90.9 91.1  PLT 317 312  --  330 266   Basic Metabolic Panel: Recent Labs  Lab 04/10/24 1928 04/11/24 0428 04/12/24 0249  NA 137 137 136  K 4.2 3.9 3.9  CL 100 104 105  CO2 26 24 26   GLUCOSE 156* 147* 142*  BUN 15 15 13   CREATININE 1.11 1.02 1.12  CALCIUM 9.3 8.6* 8.2*  MG 1.7 1.7  --    Liver Function Tests: Recent Labs  Lab 04/11/24 0428  AST 11*  ALT 10  ALKPHOS 62  BILITOT 1.0  PROT 6.3*  ALBUMIN 2.9*   CBG: Recent Labs  Lab 04/13/24 1120 04/13/24 1536 04/13/24 2106 04/14/24 0608 04/14/24 1110  GLUCAP 203* 181* 145* 156* 156*    Discharge time spent: greater than 30 minutes.  Signed: Clancy Crimes, MD [facility] 04/14/2024

## 2024-04-15 LAB — CULTURE, BLOOD (ROUTINE X 2)
Culture: NO GROWTH
Culture: NO GROWTH
Special Requests: ADEQUATE
Special Requests: ADEQUATE

## 2024-05-02 ENCOUNTER — Other Ambulatory Visit: Payer: Self-pay | Admitting: Interventional Radiology

## 2024-05-02 DIAGNOSIS — Z9359 Other cystostomy status: Secondary | ICD-10-CM

## 2024-05-04 ENCOUNTER — Other Ambulatory Visit (HOSPITAL_COMMUNITY): Payer: Self-pay | Admitting: Interventional Radiology

## 2024-05-04 DIAGNOSIS — R339 Retention of urine, unspecified: Secondary | ICD-10-CM

## 2024-05-09 ENCOUNTER — Telehealth: Payer: Self-pay

## 2024-05-09 NOTE — Telephone Encounter (Signed)
 Patient's wife presented to the radiology office requesting to speak with provider about why this patient needed to return for upsize. Provided reasoning behind upsizing from current 14 Fr pigtail SP tube to 16 Fr council tip cath. She voices understanding.   Provided the patient line for her to give to [facility] for additional concerns.

## 2024-05-21 NOTE — H&P (Signed)
 Chief Complaint: History of disease with chronic urinary retention and UTI. Patient presents for suprapubic catheter exchange and upsize.   Referring Physician(s): Yamagata,Glenn  Supervising Physician: Jenna Hacker  Patient Status: [facility] - Out-pt  History of Present Illness: Robert [name] is a 78 y.o. male outpatient. 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 On 6.16.25 IR placed a 14 Fr suprapubic catheter.  Post procedure complicated by gross hematuria requiring admission. Found to have UTI ( klebsiella) and discharged him  on ciprofloxacin .  Patient presents for suprapubic catheter exchange and upsize.   Currently without any significant complaints. Patient alert and laying in bed,calm. Denies any fevers, headache, chest pain, SOB, cough, abdominal pain, nausea, vomiting or bleeding.   Labs pending. Patient is on ASA last dose several months ago per wife at bedside. and plavix . Last dose 4 days ago per wife. . Allergies include bactrim . Patient has been NPO since midnight.   Return precautions and treatment recommendations and follow-up discussed with the patient  and his wife . Both who are agreeable with the plan.    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   Diabetes mellitus without complication (HCC)    Diverticulosis    Frequency of urination    GERD (gastroesophageal reflux disease)    Gross hematuria    History of acute pyelonephritis    10-13-2012   History of CVA with residual deficit    2008--  left side of body weakness and foot drop (wears leg brace and uses cane)   History of DVT of lower extremity    2008--  cva   Hypercholesteremia    Hyperlipidemia     Hyperlipidemia    Hypertension    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 2008   Left leg DVT (HCC)    Neuromuscular disorder (HCC)    Parkinsons   S/P insertion of IVC (inferior vena caval) filter    2008   Urethral stricture    Urgency of urination    Urinary retention    Weakness of left side of body    secondary to cva 2008   Wears glasses    Wears hearing aid    bilateral-- wears intermittantly    Past Surgical History:  Procedure Laterality Date   CYSTOSCOPY WITH RETROGRADE URETHROGRAM N/A 10/23/2015   Procedure: CYSTOSCOPY WITH RETROGRADE URETHROGRAM;  Surgeon: Alm Fragmin, MD;  Location: [facility];  Service: Urology;  Laterality: N/A;   CYSTOSCOPY WITH URETHRAL DILATATION N/A 10/23/2015   Procedure: CYSTOSCOPY WITH URETHRAL BALLOON DILATATION;  Surgeon: Alm Fragmin, MD;  Location: [facility];  Service: Urology;  Laterality: N/A;  BALLOON DILATION    IVC FILTER PLACEMENT ([facility] HX)  2008   LAPAROSCOPIC CHOLECYSTECTOMY SINGLE PORT N/A 01/09/2021   Procedure: LAPAROSCOPIC CHOLECYSTECTOMY WITH IOC;  Surgeon: Sheldon Standing, MD;  Location: [facility];  Service: General;  Laterality: N/A;  90 MIN   TRANSCAROTID ARTERY REVASCULARIZATION  Left 05/12/2021   Procedure: LEFT TRANSCAROTID ARTERY REVASCULARIZATION;  Surgeon: Gretta Lonni PARAS, MD;  Location: [facility] OR;  Service: Vascular;  Laterality: Left;    Allergies: Fluconazole, Propofol , Lisinopril, Fluoxetine, Nystatin, and Sulfamethoxazole -trimethoprim   Medications: Prior to  Admission medications   Medication Sig Start Date End Date Taking? Authorizing Provider  acetaminophen  (TYLENOL ) 500 MG tablet Take 1,000 mg by mouth every 6 (six) hours as needed for mild pain (pain score 1-3), moderate pain (pain score 4-6) or fever.    [provider]  apixaban  (ELIQUIS ) 5 MG TABS tablet Take 1 tablet (5 mg total) by mouth 2 (two) times daily. 05/27/21   Love, Sharlet RAMAN, PA-C  ascorbic acid (VITAMIN C)  250 MG tablet Take 250 mg by mouth in the morning and at bedtime.    [provider]  aspirin  EC 81 MG tablet Take 81 mg by mouth daily. Swallow whole.    [provider]  bacitracin 500 UNIT/GM ointment Apply 1 Application topically daily as needed for wound care (near catheter).    [provider]  busPIRone  (BUSPAR ) 10 MG tablet Take 5-10 mg by mouth See admin instructions. Take 10 mg by mouth in the morning and 5 mg at noon & bedtime    [provider]  carbidopa -levodopa  (SINEMET ) 25-100 MG tablet Take 2-3 tablets by mouth See admin instructions. Take 3 tablets by mouth at 7 AM & 12 Noon and 2 tablets between 5-6 PM/after the evening meal    [provider]  Cholecalciferol 50 MCG (2000 UT) TABS Take 2,000 Units by mouth daily.    [provider]  Dupilumab 300 MG/2ML SOAJ Inject 300 mg into the skin.    [provider]  HYDROCORTISONE  ACE, RECTAL, 30 MG SUPP Place 30 mg rectally at bedtime.    [provider]  melatonin 3 MG TABS tablet Take 6 mg by mouth at bedtime.    [provider]  memantine  (NAMENDA ) 10 MG tablet Take 20 mg by mouth at bedtime.    [provider]  metFORMIN  (GLUCOPHAGE ) 500 MG tablet Take 500 mg by mouth at bedtime.    [provider]  methenamine (MANDELAMINE) 1 g tablet Take 1,000 mg by mouth See admin instructions. Crush 1,000 mg and place into applesauce- take morning and bedtime    [provider]  miconazole (MICOTIN) 2 % powder Apply 1 Application topically See admin instructions. Apply in the groin area once a day as needed    [provider]  nystatin-triamcinolone ointment (MYCOLOG) Apply 1 application  topically as needed (rash/yeast). 10/26/21   [provider]  omeprazole (PRILOSEC) 20 MG capsule Take 20 mg by mouth daily before breakfast.    [provider]  oxybutynin  (DITROPAN ) 5 MG tablet Take 2.5 mg by mouth in the morning  and at bedtime.    [provider]  Pimavanserin  Tartrate (NUPLAZID ) 34 MG CAPS Take 34 mg by mouth daily.    [provider]  psyllium (METAMUCIL) 58.6 % powder See admin instructions. Mix 2 teaspoonsful of powder into 16 ounces of water and drink by mouth once a day    [provider]  rasagiline  (AZILECT ) 1 MG TABS tablet Take 1 mg by mouth daily.    [provider]  senna-docusate (SENOKOT-S) 8.6-50 MG tablet Take 2 tablets by mouth daily as needed for mild constipation or moderate constipation.    [provider]  simvastatin  (ZOCOR ) 20 MG tablet Take 10 mg by mouth at bedtime.    [provider]  tacrolimus (PROTOPIC) 0.1 % ointment Apply 1 application  topically See admin instructions. Apply to groin, armpits, and bottom 2 times a day when skin is broken out 11/06/21  [provider]  triamcinolone cream (KENALOG) 0.1 % Apply 1 Application topically daily as needed (rash).    [provider]  zinc  oxide 20 % ointment Apply 1 Application topically See admin instructions. Every time there is a diaper change Patient taking differently: Apply 1 Application topically See admin instructions. Apply to affected areas with every pad change 01/27/24   [name], MD     Family History  Problem Relation Age of Onset   Diabetes Sister    Lung cancer Brother        Post 9/11 voluntary work in [employer]   Prostate cancer Brother    Other Mother        passed away young- non medical   Alzheimer's disease Father    Healthy Son     Social History   Socioeconomic History   Marital status: Married    Spouse name: [name]   Number of children: 1   Years of education: 12   Highest education level: High school graduate  Occupational History   Occupation: retired    Comment: Art therapist  Tobacco Use   Smoking status: Former    Current packs/day: 0.00    Types: Cigarettes    Start date: 10/26/1960    Quit date: 10/26/1970     Years since quitting: 53.6   Smokeless tobacco: Never  Vaping Use   Vaping status: Never Used  Substance and Sexual Activity   Alcohol use: No   Drug use: No   Sexual activity: Not on file  Other Topics Concern   Not on file  Social History Narrative   Lives in [place] with wife. He is a Cytogeneticist.   Has one son who lives in [place] . He is healthy.   [facility] [place] [place].      Patient is right-handed. He lives with his wife in a one level home.   Social Drivers of Health   Financial Resource Strain: Medium Risk (02/20/2020)   Overall Financial Resource Strain (CARDIA)    Difficulty of Paying Living Expenses: Somewhat hard  Food Insecurity: No Food Insecurity (04/11/2024)   Hunger Vital Sign    Worried About Running Out of Food in the Last Year: Never true    Ran Out of Food in the Last Year: Never true  Transportation Needs: No Transportation Needs (04/12/2024)   PRAPARE - Administrator, Civil Service (Medical): No    Lack of Transportation (Non-Medical): No  Recent Concern: Transportation Needs - Unmet Transportation Needs (01/22/2024)   PRAPARE - Administrator, Civil Service (Medical): No    Lack of Transportation (Non-Medical): Yes  Physical Activity: Not on file  Stress: Not on file  Social Connections: Socially Isolated (04/11/2024)   Social Connection and Isolation Panel    Frequency of Communication with Friends and Family: Never    Frequency of Social Gatherings with Friends and Family: Never    Attends Religious Services: Never    Database administrator or Organizations: No    Attends Engineer, structural: Never    Marital Status: Married     Review of Systems: A 12 point ROS discussed and pertinent positives are indicated in the HPI above.  All other systems are negative.  Review of Systems  Constitutional:  Negative for fever.  HENT:  Negative for congestion.   Respiratory:  Negative for cough  and shortness of breath.   Cardiovascular:  Negative for chest pain.  Gastrointestinal:  Negative for abdominal pain.  Neurological:  Negative for headaches.  Psychiatric/Behavioral:  Negative for behavioral problems and confusion.     Vital Signs: BP (!) 149/81   Pulse 69   Temp 97.6 F (36.4 C) (Oral)   Ht 6' 4 (1.93 m)   Wt 230 lb (104.3 kg)   SpO2 98%   BMI 28.00 kg/m   Advance Care Plan: The advanced care plan/surrogate decision maker was discussed at the time of visit and the patient did not wish to discuss or was not able to name a surrogate decision maker or provide an advance care plan.    Physical Exam Vitals and nursing note reviewed.  Constitutional:      Appearance: He is well-developed.  HENT:     Head: Normocephalic.     Mouth/Throat:     Mouth: Mucous membranes are dry.  Cardiovascular:     Rate and Rhythm: Normal rate.  Pulmonary:     Effort: Pulmonary effort is normal.  Musculoskeletal:        General: Normal range of motion.     Cervical back: Normal range of motion.  Skin:    General: Skin is warm and dry.  Neurological:     General: No focal deficit present.     Mental Status: He is alert and oriented to person, place, and time. Mental status is at baseline.  Psychiatric:        Mood and Affect: Mood normal.        Behavior: Behavior normal.        Thought Content: Thought content normal.     Imaging: No results found.  Labs:  CBC: Recent Labs    04/10/24 1928 04/11/24 0150 04/11/24 0428 04/12/24 0249 05/22/24 0851  WBC 10.5  --  10.6* 8.7 5.6  HGB 14.4 13.3 13.6 12.1* 13.9  HCT 44.5 40.9 41.9 37.9* 44.0  PLT 312  --  330 266 284    COAGS: Recent Labs    09/15/23 1208 04/10/24 0842 04/10/24 2038 05/22/24 0851  INR 1.3* 1.1 1.1 1.1  APTT 31  --   --   --     BMP: Recent Labs    04/01/24 0542 04/10/24 1928 04/11/24 0428 04/12/24 0249  NA 139 137 137 136  K 3.8 4.2 3.9 3.9  CL 107 100 104 105  CO2 25 26 24 26    GLUCOSE 133* 156* 147* 142*  BUN 13 15 15 13   CALCIUM 8.9 9.3 8.6* 8.2*  CREATININE 0.84 1.11 1.02 1.12  GFRNONAA >60 >60 >60 >60    LIVER FUNCTION TESTS: Recent Labs    01/24/24 0441 03/31/24 1545 04/01/24 0542 04/11/24 0428  BILITOT 0.6 1.0 0.9 1.0  AST 14* 8* 6* 11*  ALT 7 11 7 10   ALKPHOS 58 73 62 62  PROT 6.2* 7.1 6.0* 6.3*  ALBUMIN 3.0* 3.5 3.0* 2.9*    TUMOR MARKERS: No results for input(s): AFPTM, CEA, CA199, CHROMGRNA in the last 8760 hours.  Assessment and Plan:  78 y.o. male outpatient. 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 On 6.16.25 IR placed a 14 Fr suprapubic catheter.  Post procedure complicated by gross hematuria requiring admission. Found to have UTI ( klebsiella) and discharged him  on ciprofloxacin .  Patient presents for suprapubic catheter exchange and upsize.   PLAN: IR Image Guided Suprapubic Catheter Exchange  and Upsize.   Risks and benefits discussed with the patient including bleeding, infection, damage to adjacent structures, bowel perforation/fistula connection, and sepsis.  All of the patient's questions were answered, patient is agreeable to proceed. Consent signed and in chart.   Thank you for this interesting consult.  I greatly enjoyed meeting Robert [name] and look forward to participating in their care.  A copy of this report was sent to the requesting provider on this date.  Electronically Signed: Delon JAYSON Beagle, NP 05/22/2024, 9:31 AM   I spent a total of  30 Minutes   in face to face in clinical consultation, greater than 50% of which was counseling/coordinating care for suprapubic catheter exchange and upsize

## 2024-05-22 ENCOUNTER — Other Ambulatory Visit: Payer: Self-pay

## 2024-05-22 ENCOUNTER — Other Ambulatory Visit (HOSPITAL_COMMUNITY): Payer: Self-pay | Admitting: Interventional Radiology

## 2024-05-22 ENCOUNTER — Other Ambulatory Visit (HOSPITAL_COMMUNITY): Payer: Self-pay | Admitting: Radiology

## 2024-05-22 ENCOUNTER — Ambulatory Visit (HOSPITAL_COMMUNITY)
Admission: RE | Admit: 2024-05-22 | Discharge: 2024-05-22 | Disposition: A | Discharge status: Home / Self Care | Source: Ambulatory Visit | Attending: Interventional Radiology | Admitting: Interventional Radiology

## 2024-05-22 DIAGNOSIS — R339 Retention of urine, unspecified: Secondary | ICD-10-CM

## 2024-05-22 DIAGNOSIS — Z87891 Personal history of nicotine dependence: Secondary | ICD-10-CM | POA: Diagnosis not present

## 2024-05-22 DIAGNOSIS — N400 Enlarged prostate without lower urinary tract symptoms: Secondary | ICD-10-CM

## 2024-05-22 DIAGNOSIS — Z5986 Financial insecurity: Secondary | ICD-10-CM | POA: Diagnosis not present

## 2024-05-22 DIAGNOSIS — Z7982 Long term (current) use of aspirin: Secondary | ICD-10-CM | POA: Insufficient documentation

## 2024-05-22 HISTORY — PX: IR CYSTOSTOMY TUBE CHANGE COMPLICATED W IMG: IMG1098

## 2024-05-22 LAB — CBC
HCT: 44 % (ref 39.0–52.0)
Hemoglobin: 13.9 g/dL (ref 13.0–17.0)
MCH: 28.7 pg (ref 26.0–34.0)
MCHC: 31.6 g/dL (ref 30.0–36.0)
MCV: 90.9 fL (ref 80.0–100.0)
Platelets: 284 K/uL (ref 150–400)
RBC: 4.84 MIL/uL (ref 4.22–5.81)
RDW: 13.4 % (ref 11.5–15.5)
WBC: 5.6 K/uL (ref 4.0–10.5)
nRBC: 0 % (ref 0.0–0.2)

## 2024-05-22 LAB — PROTIME-INR
INR: 1.1 (ref 0.8–1.2)
Prothrombin Time: 14.5 s (ref 11.4–15.2)

## 2024-05-22 LAB — GLUCOSE, CAPILLARY: Glucose-Capillary: 113 mg/dL — ABNORMAL HIGH (ref 70–99)

## 2024-05-22 MED ORDER — LIDOCAINE HCL 1 % IJ SOLN
20.0000 mL | Freq: Once | INTRAMUSCULAR | Status: AC
  Administered 2024-05-22: 10 mL

## 2024-05-22 MED ORDER — LIDOCAINE HCL 1 % IJ SOLN
INTRAMUSCULAR | Status: AC
  Filled 2024-05-22: qty 20

## 2024-05-22 MED ORDER — MIDAZOLAM HCL 2 MG/2ML IJ SOLN
INTRAMUSCULAR | Status: AC
  Filled 2024-05-22: qty 2

## 2024-05-22 MED ORDER — MIDAZOLAM HCL 2 MG/2ML IJ SOLN
INTRAMUSCULAR | Status: AC | PRN
Start: 2024-05-22 — End: 2024-05-22
  Administered 2024-05-22 (×2): 1 mg via INTRAVENOUS

## 2024-05-22 MED ORDER — IOHEXOL 300 MG/ML  SOLN
50.0000 mL | Freq: Once | INTRAMUSCULAR | Status: AC | PRN
  Administered 2024-05-22: 20 mL

## 2024-05-22 MED ORDER — LIDOCAINE VISCOUS HCL 2 % MT SOLN
OROMUCOSAL | Status: AC
  Filled 2024-05-22: qty 15

## 2024-05-22 MED ORDER — SODIUM CHLORIDE 0.9 % IV SOLN: INTRAVENOUS | Status: DC

## 2024-05-22 NOTE — Progress Notes (Signed)
 Patient and wife was given discharge instructions. Both verbalized understanding.

## 2024-05-22 NOTE — Procedures (Signed)
 Pre procedural Dx: Robert [name]  Post procedural Dx: Same  Technically successful upsizing of SPC catheter.      EBL: Trace Complications: None immediate  Robert Banner, MD Pager #: [phone]

## 2024-09-08 ENCOUNTER — Emergency Department (HOSPITAL_COMMUNITY)

## 2024-09-08 ENCOUNTER — Inpatient Hospital Stay (HOSPITAL_COMMUNITY)
Admission: EM | Admit: 2024-09-08 | Discharge: 2024-09-11 | DRG: 698 | Disposition: A | Discharge status: Home Health | Attending: Emergency Medicine | Admitting: Emergency Medicine

## 2024-09-08 ENCOUNTER — Encounter (HOSPITAL_COMMUNITY): Payer: Self-pay | Admitting: *Deleted

## 2024-09-08 ENCOUNTER — Other Ambulatory Visit: Payer: Self-pay

## 2024-09-08 DIAGNOSIS — Z888 Allergy status to other drugs, medicaments and biological substances status: Secondary | ICD-10-CM

## 2024-09-08 DIAGNOSIS — Y846 Urinary catheterization as the cause of abnormal reaction of the patient, or of later complication, without mention of misadventure at the time of the procedure: Secondary | ICD-10-CM | POA: Diagnosis present

## 2024-09-08 DIAGNOSIS — N319 Neuromuscular dysfunction of bladder, unspecified: Secondary | ICD-10-CM | POA: Diagnosis present

## 2024-09-08 DIAGNOSIS — N136 Pyonephrosis: Secondary | ICD-10-CM | POA: Diagnosis present

## 2024-09-08 DIAGNOSIS — R509 Fever, unspecified: Secondary | ICD-10-CM | POA: Diagnosis present

## 2024-09-08 DIAGNOSIS — N400 Enlarged prostate without lower urinary tract symptoms: Secondary | ICD-10-CM | POA: Diagnosis present

## 2024-09-08 DIAGNOSIS — I5032 Chronic diastolic (congestive) heart failure: Secondary | ICD-10-CM | POA: Diagnosis present

## 2024-09-08 DIAGNOSIS — N35919 Unspecified urethral stricture, male, unspecified site: Secondary | ICD-10-CM | POA: Diagnosis present

## 2024-09-08 DIAGNOSIS — Z1152 Encounter for screening for COVID-19: Secondary | ICD-10-CM | POA: Diagnosis not present

## 2024-09-08 DIAGNOSIS — N401 Enlarged prostate with lower urinary tract symptoms: Secondary | ICD-10-CM | POA: Diagnosis present

## 2024-09-08 DIAGNOSIS — K219 Gastro-esophageal reflux disease without esophagitis: Secondary | ICD-10-CM | POA: Diagnosis present

## 2024-09-08 DIAGNOSIS — R54 Age-related physical debility: Secondary | ICD-10-CM | POA: Diagnosis present

## 2024-09-08 DIAGNOSIS — Z7901 Long term (current) use of anticoagulants: Secondary | ICD-10-CM | POA: Diagnosis not present

## 2024-09-08 DIAGNOSIS — L89322 Pressure ulcer of left buttock, stage 2: Secondary | ICD-10-CM | POA: Diagnosis present

## 2024-09-08 DIAGNOSIS — T730XXA Starvation, initial encounter: Secondary | ICD-10-CM | POA: Diagnosis present

## 2024-09-08 DIAGNOSIS — I69398 Other sequelae of cerebral infarction: Secondary | ICD-10-CM

## 2024-09-08 DIAGNOSIS — G928 Other toxic encephalopathy: Secondary | ICD-10-CM | POA: Diagnosis present

## 2024-09-08 DIAGNOSIS — Z820 Family history of epilepsy and other diseases of the nervous system: Secondary | ICD-10-CM

## 2024-09-08 DIAGNOSIS — X58XXXA Exposure to other specified factors, initial encounter: Secondary | ICD-10-CM | POA: Diagnosis present

## 2024-09-08 DIAGNOSIS — Z7982 Long term (current) use of aspirin: Secondary | ICD-10-CM

## 2024-09-08 DIAGNOSIS — E7800 Pure hypercholesterolemia, unspecified: Secondary | ICD-10-CM | POA: Diagnosis present

## 2024-09-08 DIAGNOSIS — Z7984 Long term (current) use of oral hypoglycemic drugs: Secondary | ICD-10-CM | POA: Diagnosis not present

## 2024-09-08 DIAGNOSIS — I82409 Acute embolism and thrombosis of unspecified deep veins of unspecified lower extremity: Secondary | ICD-10-CM | POA: Insufficient documentation

## 2024-09-08 DIAGNOSIS — E872 Acidosis, unspecified: Secondary | ICD-10-CM | POA: Diagnosis present

## 2024-09-08 DIAGNOSIS — N390 Urinary tract infection, site not specified: Principal | ICD-10-CM | POA: Diagnosis present

## 2024-09-08 DIAGNOSIS — T83518A Infection and inflammatory reaction due to other urinary catheter, initial encounter: Principal | ICD-10-CM | POA: Diagnosis present

## 2024-09-08 DIAGNOSIS — F8089 Other developmental disorders of speech and language: Secondary | ICD-10-CM | POA: Diagnosis present

## 2024-09-08 DIAGNOSIS — Z974 Presence of external hearing-aid: Secondary | ICD-10-CM

## 2024-09-08 DIAGNOSIS — E785 Hyperlipidemia, unspecified: Secondary | ICD-10-CM | POA: Diagnosis present

## 2024-09-08 DIAGNOSIS — E119 Type 2 diabetes mellitus without complications: Secondary | ICD-10-CM | POA: Diagnosis present

## 2024-09-08 DIAGNOSIS — G20A1 Parkinson's disease without dyskinesia, without mention of fluctuations: Secondary | ICD-10-CM | POA: Diagnosis present

## 2024-09-08 DIAGNOSIS — I69354 Hemiplegia and hemiparesis following cerebral infarction affecting left non-dominant side: Secondary | ICD-10-CM | POA: Diagnosis not present

## 2024-09-08 DIAGNOSIS — Z882 Allergy status to sulfonamides status: Secondary | ICD-10-CM

## 2024-09-08 DIAGNOSIS — I110 Hypertensive heart disease with heart failure: Secondary | ICD-10-CM | POA: Diagnosis present

## 2024-09-08 DIAGNOSIS — I69959 Hemiplegia and hemiparesis following unspecified cerebrovascular disease affecting unspecified side: Secondary | ICD-10-CM | POA: Insufficient documentation

## 2024-09-08 DIAGNOSIS — F419 Anxiety disorder, unspecified: Secondary | ICD-10-CM | POA: Diagnosis present

## 2024-09-08 DIAGNOSIS — Z8042 Family history of malignant neoplasm of prostate: Secondary | ICD-10-CM

## 2024-09-08 DIAGNOSIS — Z833 Family history of diabetes mellitus: Secondary | ICD-10-CM

## 2024-09-08 DIAGNOSIS — G9341 Metabolic encephalopathy: Secondary | ICD-10-CM | POA: Diagnosis present

## 2024-09-08 DIAGNOSIS — Z86718 Personal history of other venous thrombosis and embolism: Secondary | ICD-10-CM

## 2024-09-08 DIAGNOSIS — Z79899 Other long term (current) drug therapy: Secondary | ICD-10-CM

## 2024-09-08 DIAGNOSIS — L89312 Pressure ulcer of right buttock, stage 2: Secondary | ICD-10-CM | POA: Diagnosis present

## 2024-09-08 DIAGNOSIS — Z801 Family history of malignant neoplasm of trachea, bronchus and lung: Secondary | ICD-10-CM

## 2024-09-08 DIAGNOSIS — Z87891 Personal history of nicotine dependence: Secondary | ICD-10-CM

## 2024-09-08 DIAGNOSIS — T83510A Infection and inflammatory reaction due to cystostomy catheter, initial encounter: Secondary | ICD-10-CM | POA: Diagnosis not present

## 2024-09-08 DIAGNOSIS — K644 Residual hemorrhoidal skin tags: Secondary | ICD-10-CM | POA: Insufficient documentation

## 2024-09-08 DIAGNOSIS — R41841 Cognitive communication deficit: Secondary | ICD-10-CM

## 2024-09-08 LAB — COMPREHENSIVE METABOLIC PANEL WITH GFR
ALT: 13 U/L (ref 0–44)
AST: 10 U/L — ABNORMAL LOW (ref 15–41)
Albumin: 3.7 g/dL (ref 3.5–5.0)
Alkaline Phosphatase: 81 U/L (ref 38–126)
Anion gap: 10 (ref 5–15)
BUN: 15 mg/dL (ref 8–23)
CO2: 25 mmol/L (ref 22–32)
Calcium: 9.2 mg/dL (ref 8.9–10.3)
Chloride: 102 mmol/L (ref 98–111)
Creatinine, Ser: 0.98 mg/dL (ref 0.61–1.24)
GFR, Estimated: >60 mL/min (ref >60)
Glucose, Bld: 176 mg/dL — ABNORMAL HIGH (ref 70–99)
Potassium: 4 mmol/L (ref 3.5–5.1)
Sodium: 137 mmol/L (ref 135–145)
Total Bilirubin: 0.7 mg/dL (ref 0.0–1.2)
Total Protein: 6.9 g/dL (ref 6.5–8.1)

## 2024-09-08 LAB — URINALYSIS, ROUTINE W REFLEX MICROSCOPIC
Bilirubin Urine: NEGATIVE
Glucose, UA: NEGATIVE mg/dL
Ketones, ur: NEGATIVE mg/dL
Nitrite: NEGATIVE
Protein, ur: 30 mg/dL — AB
Specific Gravity, Urine: 1.018 (ref 1.005–1.030)
WBC, UA: >50 WBC/hpf (ref 0–5)
pH: 7 (ref 5.0–8.0)

## 2024-09-08 LAB — GLUCOSE, CAPILLARY
Glucose-Capillary: 133 mg/dL — ABNORMAL HIGH (ref 70–99)
Glucose-Capillary: 191 mg/dL — ABNORMAL HIGH (ref 70–99)

## 2024-09-08 LAB — CBG MONITORING, ED
Glucose-Capillary: 111 mg/dL — ABNORMAL HIGH (ref 70–99)
Glucose-Capillary: 95 mg/dL (ref 70–99)

## 2024-09-08 LAB — CBC
HCT: 45.2 % (ref 39.0–52.0)
Hemoglobin: 14.7 g/dL (ref 13.0–17.0)
MCH: 29.5 pg (ref 26.0–34.0)
MCHC: 32.5 g/dL (ref 30.0–36.0)
MCV: 90.8 fL (ref 80.0–100.0)
Platelets: 223 K/uL (ref 150–400)
RBC: 4.98 MIL/uL (ref 4.22–5.81)
RDW: 14.9 % (ref 11.5–15.5)
WBC: 11.5 K/uL — ABNORMAL HIGH (ref 4.0–10.5)
nRBC: 0 % (ref 0.0–0.2)

## 2024-09-08 LAB — I-STAT CG4 LACTIC ACID, ED
Lactic Acid, Venous: 2.2 mmol/L (ref 0.5–1.9)
Lactic Acid, Venous: 2.1 mmol/L (ref 0.5–1.9)
Lactic Acid, Venous: 2.8 mmol/L (ref 0.5–1.9)
Lactic Acid, Venous: 1.3 mmol/L (ref 0.5–1.9)

## 2024-09-08 LAB — HEMOGLOBIN A1C
Hgb A1c MFr Bld: 6.3 % — ABNORMAL HIGH (ref 4.8–5.6)
Mean Plasma Glucose: 134.11 mg/dL

## 2024-09-08 LAB — RESP PANEL BY RT-PCR (RSV, FLU A&B, COVID)  RVPGX2
Influenza A by PCR: NEGATIVE
Influenza B by PCR: NEGATIVE
Resp Syncytial Virus by PCR: NEGATIVE
SARS Coronavirus 2 by RT PCR: NEGATIVE

## 2024-09-08 LAB — APTT: aPTT: 32 s (ref 24–36)

## 2024-09-08 LAB — PROTIME-INR
INR: 1.3 — ABNORMAL HIGH (ref 0.8–1.2)
Prothrombin Time: 16.5 s — ABNORMAL HIGH (ref 11.4–15.2)

## 2024-09-08 LAB — LACTIC ACID, PLASMA: Lactic Acid, Venous: 2 mmol/L (ref 0.5–1.9)

## 2024-09-08 MED ORDER — PIMAVANSERIN TARTRATE 34 MG PO CAPS
34.0000 mg | ORAL_CAPSULE | Freq: Every day | ORAL | Status: DC
  Filled 2024-09-08: qty 1

## 2024-09-08 MED ORDER — OXYBUTYNIN CHLORIDE 5 MG PO TABS
2.5000 mg | ORAL_TABLET | Freq: Two times a day (BID) | ORAL | Status: DC
  Administered 2024-09-08 – 2024-09-11 (×6): 2.5 mg via ORAL
  Filled 2024-09-08 (×6): qty 1

## 2024-09-08 MED ORDER — ONDANSETRON HCL 4 MG/2ML IJ SOLN: 4.0000 mg | Freq: Four times a day (QID) | INTRAMUSCULAR | Status: DC | PRN

## 2024-09-08 MED ORDER — MICONAZOLE NITRATE 2 % EX CREA: 1.0000 | TOPICAL_CREAM | Freq: Every day | CUTANEOUS | Status: DC | PRN

## 2024-09-08 MED ORDER — PANTOPRAZOLE SODIUM 40 MG PO TBEC
40.0000 mg | DELAYED_RELEASE_TABLET | Freq: Every day | ORAL | Status: DC
  Administered 2024-09-09 – 2024-09-11 (×3): 40 mg via ORAL
  Filled 2024-09-08 (×3): qty 1

## 2024-09-08 MED ORDER — SODIUM CHLORIDE 0.9 % IV BOLUS (SEPSIS)
1000.0000 mL | Freq: Once | INTRAVENOUS | Status: AC
  Administered 2024-09-08: 1000 mL via INTRAVENOUS

## 2024-09-08 MED ORDER — LABETALOL HCL 5 MG/ML IV SOLN
10.0000 mg | Freq: Once | INTRAVENOUS | Status: AC
  Administered 2024-09-08: 10 mg via INTRAVENOUS
  Filled 2024-09-08: qty 4

## 2024-09-08 MED ORDER — ZINC OXIDE 20 % EX OINT: 1.0000 | TOPICAL_OINTMENT | Freq: Every day | CUTANEOUS | Status: DC | PRN

## 2024-09-08 MED ORDER — BUSPIRONE HCL 5 MG PO TABS
5.0000 mg | ORAL_TABLET | Freq: Two times a day (BID) | ORAL | Status: DC
  Administered 2024-09-08 – 2024-09-11 (×6): 5 mg via ORAL
  Filled 2024-09-08 (×6): qty 1

## 2024-09-08 MED ORDER — METFORMIN HCL 500 MG PO TABS: 500.0000 mg | ORAL_TABLET | Freq: Every day | ORAL | Status: DC

## 2024-09-08 MED ORDER — SIMVASTATIN 10 MG PO TABS
10.0000 mg | ORAL_TABLET | Freq: Every day | ORAL | Status: DC
  Administered 2024-09-08 – 2024-09-10 (×3): 10 mg via ORAL
  Filled 2024-09-08 (×3): qty 1

## 2024-09-08 MED ORDER — TACROLIMUS 0.1 % EX OINT: 1.0000 | TOPICAL_OINTMENT | Freq: Two times a day (BID) | CUTANEOUS | Status: DC | PRN

## 2024-09-08 MED ORDER — NYSTATIN-TRIAMCINOLONE 100000-0.1 UNIT/GM-% EX CREA: TOPICAL_CREAM | CUTANEOUS | Status: DC | PRN

## 2024-09-08 MED ORDER — RASAGILINE MESYLATE 1 MG PO TABS
1.0000 mg | ORAL_TABLET | Freq: Every day | ORAL | Status: DC
  Administered 2024-09-09 – 2024-09-11 (×3): 1 mg via ORAL
  Filled 2024-09-08 (×3): qty 1

## 2024-09-08 MED ORDER — PIMAVANSERIN TARTRATE 34 MG PO CAPS
34.0000 mg | ORAL_CAPSULE | Freq: Every day | ORAL | Status: DC
  Administered 2024-09-08 – 2024-09-11 (×4): 34 mg via ORAL
  Filled 2024-09-08 (×4): qty 1

## 2024-09-08 MED ORDER — ASPIRIN 81 MG PO TBEC
81.0000 mg | DELAYED_RELEASE_TABLET | Freq: Every day | ORAL | Status: DC
  Administered 2024-09-09 – 2024-09-11 (×3): 81 mg via ORAL
  Filled 2024-09-08 (×3): qty 1

## 2024-09-08 MED ORDER — NYSTATIN-TRIAMCINOLONE 100000-0.1 UNIT/GM-% EX OINT: 1.0000 | TOPICAL_OINTMENT | CUTANEOUS | Status: DC | PRN

## 2024-09-08 MED ORDER — MEMANTINE HCL 10 MG PO TABS
20.0000 mg | ORAL_TABLET | Freq: Every day | ORAL | Status: DC
  Administered 2024-09-08 – 2024-09-10 (×3): 20 mg via ORAL
  Filled 2024-09-08 (×3): qty 2

## 2024-09-08 MED ORDER — SODIUM CHLORIDE 0.9 % IV SOLN
2.0000 g | Freq: Once | INTRAVENOUS | Status: AC
  Administered 2024-09-08: 2 g via INTRAVENOUS
  Filled 2024-09-08: qty 12.5

## 2024-09-08 MED ORDER — LACTATED RINGERS IV BOLUS
1000.0000 mL | Freq: Once | INTRAVENOUS | Status: AC
  Administered 2024-09-08: 1000 mL via INTRAVENOUS

## 2024-09-08 MED ORDER — SENNOSIDES-DOCUSATE SODIUM 8.6-50 MG PO TABS: 2.0000 | ORAL_TABLET | Freq: Every day | ORAL | Status: DC | PRN

## 2024-09-08 MED ORDER — SALINE SPRAY 0.65 % NA SOLN
1.0000 | NASAL | Status: DC | PRN
  Administered 2024-09-09: 1 via NASAL
  Filled 2024-09-08: qty 44

## 2024-09-08 MED ORDER — CARBIDOPA-LEVODOPA 25-100 MG PO TABS
2.0000 | ORAL_TABLET | Freq: Every day | ORAL | Status: DC
  Administered 2024-09-08 – 2024-09-10 (×3): 2 via ORAL
  Filled 2024-09-08 (×3): qty 2

## 2024-09-08 MED ORDER — ACETAMINOPHEN 650 MG RE SUPP: 650.0000 mg | Freq: Four times a day (QID) | RECTAL | Status: DC | PRN

## 2024-09-08 MED ORDER — SODIUM CHLORIDE 0.9 % IV SOLN
1.0000 g | Freq: Three times a day (TID) | INTRAVENOUS | Status: DC
  Administered 2024-09-08 – 2024-09-11 (×10): 1 g via INTRAVENOUS
  Filled 2024-09-08 (×11): qty 20

## 2024-09-08 MED ORDER — HYDROCORTISONE ACETATE 30 MG RE SUPP: 30.0000 mg | Freq: Every day | RECTAL | Status: DC

## 2024-09-08 MED ORDER — APIXABAN 5 MG PO TABS
5.0000 mg | ORAL_TABLET | Freq: Two times a day (BID) | ORAL | Status: DC
  Administered 2024-09-08 – 2024-09-11 (×6): 5 mg via ORAL
  Filled 2024-09-08 (×6): qty 1

## 2024-09-08 MED ORDER — ACETAMINOPHEN 325 MG PO TABS
650.0000 mg | ORAL_TABLET | Freq: Four times a day (QID) | ORAL | Status: DC | PRN
  Administered 2024-09-08: 650 mg via ORAL
  Filled 2024-09-08: qty 2

## 2024-09-08 MED ORDER — MELATONIN 3 MG PO TABS
6.0000 mg | ORAL_TABLET | Freq: Every day | ORAL | Status: DC
  Administered 2024-09-08 – 2024-09-10 (×3): 6 mg via ORAL
  Filled 2024-09-08 (×3): qty 2

## 2024-09-08 MED ORDER — CARBIDOPA-LEVODOPA 25-100 MG PO TABS
3.0000 | ORAL_TABLET | ORAL | Status: DC
  Administered 2024-09-09 – 2024-09-11 (×6): 3 via ORAL
  Filled 2024-09-08 (×6): qty 3

## 2024-09-08 MED ORDER — METOPROLOL SUCCINATE ER 25 MG PO TB24
25.0000 mg | ORAL_TABLET | Freq: Every day | ORAL | Status: DC
  Administered 2024-09-08 – 2024-09-09 (×2): 25 mg via ORAL
  Filled 2024-09-08 (×2): qty 1

## 2024-09-08 MED ORDER — INSULIN ASPART 100 UNIT/ML IJ SOLN
0.0000 [IU] | Freq: Three times a day (TID) | INTRAMUSCULAR | Status: DC
  Administered 2024-09-08 – 2024-09-09 (×2): 2 [IU] via SUBCUTANEOUS
  Administered 2024-09-09: 3 [IU] via SUBCUTANEOUS
  Administered 2024-09-09 – 2024-09-10 (×4): 2 [IU] via SUBCUTANEOUS
  Administered 2024-09-11: 3 [IU] via SUBCUTANEOUS
  Administered 2024-09-11: 2 [IU] via SUBCUTANEOUS
  Filled 2024-09-08 (×4): qty 2
  Filled 2024-09-08: qty 3
  Filled 2024-09-08: qty 2
  Filled 2024-09-08: qty 3
  Filled 2024-09-08 (×2): qty 2

## 2024-09-08 MED ORDER — METHENAMINE MANDELATE 0.5 G PO TABS
1000.0000 mg | ORAL_TABLET | Freq: Two times a day (BID) | ORAL | Status: DC
  Administered 2024-09-08 – 2024-09-11 (×6): 1000 mg via ORAL
  Filled 2024-09-08 (×6): qty 2

## 2024-09-08 MED ORDER — ONDANSETRON HCL 4 MG PO TABS: 4.0000 mg | ORAL_TABLET | Freq: Four times a day (QID) | ORAL | Status: DC | PRN

## 2024-09-08 NOTE — ED Triage Notes (Signed)
 Pt arrived from home with [facility] for increased confusion and fever. Hx of recurrent UTIs, not currently on antibiotics at this time. Foley catheter noted. Wife is also caregiver and reported mild confusion at home with fever of 100.6, concerned UTI is getting worse. EMS VS 160/88, pulse 120, CBG 200. EMS placed 20g to R wrist and gave enroute

## 2024-09-08 NOTE — H&P (Signed)
 History and Physical    Patient: Robert [name] FMW:[id number] DOB: [birth year] DOA: 09/08/2024 DOS: the patient was seen and examined on 09/08/2024 PCP: [facility]  Patient coming from: Home  Chief Complaint:  Chief Complaint  Patient presents with   Altered Mental Status   HPI: Robert [name] is a 78 y.o. male with medical history significant of anemia, BPH, gross hematuria, history of urinary retention, chronic indwelling Foley catheter, carotid artery occlusion, CVA with left-sided hemiparesis, left foot drop, type 2 diabetes, diverticulosis, GERD, history of acute pyelonephritis, history of DVT, history of hyperlipidemia, hypertension, chronic systolic heart failure, gallstone pancreatitis who was brought to the emergency department due to altered mental status after waking up with a fever this morning.  Symptoms are similar to prior UTI episodes. He has had mild rhinorrhea and mild nonproductive cough, but sore throat, wheezing or hemoptysis.  No chest pain, palpitations, diaphoresis, PND, orthopnea, but occasionally has pitting edema of the lower extremities.  No abdominal pain, nausea, emesis, diarrhea, constipation, melena or hematochezia.  No flank pain, dysuria, frequency or hematuria.  No polyuria, polydipsia, polyphagia or blurred vision.   Lab work: Urinalysis shows small hemoglobin, protein with 30 mg/dL, moderate leukocyte esterase, 21-50 RBC, greater than 50 WBC, few bacteria and positive WBC clumps.  Negative coronavirus, influenza and RSV PCR.  CBC showed a white count 11.5, hemoglobin 14.7 g/dL and platelets 776.  Lactic acid is 2.2 then 2.1 then 2.8 mmol/L.  CMP showed a glucose of 176 and AST of 10 units/L, electrolytes, renal function and the rest of the LFTs were normal.  Imaging: 2 view chest radiograph with no acute cardiopulmonary process.  CT head without contrast with no acute intracranial CT findings.  There is encephalomalacia from a large remote  chronic right MCA territory infarct with as ` prominence of the right lateral ventricle without midline shift.   ED course: Initial vital signs were temperature 97.9 F, pulse 111, respiration 20, BP 139/83 mmHg and O2 sat 100% on room air.  The patient received 1000 mL of LR bolus and 2 g of cefepime  IVPB.  Review of Systems: As mentioned in the history of present illness. All other systems reviewed and are negative. 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   Diabetes mellitus without complication (HCC)    Diverticulosis    Frequency of urination    GERD (gastroesophageal reflux disease)    Gross hematuria    History of acute pyelonephritis    10-13-2012   History of CVA with residual deficit    2008--  left side of body weakness and foot drop (wears leg brace and uses cane)   History of DVT of lower extremity    2008--  cva   Hypercholesteremia    Hyperlipidemia    Hyperlipidemia    Hypertension    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 2008   Left leg DVT (HCC)    Neuromuscular disorder (HCC)    Parkinsons   S/P insertion of IVC (inferior vena caval) filter    2008   Urethral stricture    Urgency of urination    Urinary retention    Weakness of left side of body    secondary to cva 2008   Wears glasses    Wears hearing aid  bilateral-- wears intermittantly   Past Surgical History:  Procedure Laterality Date   CYSTOSCOPY WITH RETROGRADE URETHROGRAM N/A 10/23/2015   Procedure: CYSTOSCOPY WITH RETROGRADE URETHROGRAM;  Surgeon: Alm Fragmin, MD;  Location: [facility];  Service: Urology;  Laterality: N/A;   CYSTOSCOPY WITH URETHRAL DILATATION N/A 10/23/2015   Procedure: CYSTOSCOPY WITH URETHRAL BALLOON DILATATION;  Surgeon: Alm Fragmin, MD;  Location: [facility];  Service: Urology;  Laterality:  N/A;  BALLOON DILATION    IR CYSTOSTOMY TUBE CHANGE COMPLICATED W IMG  05/22/2024   IVC FILTER PLACEMENT ([facility] HX)  2008   LAPAROSCOPIC CHOLECYSTECTOMY SINGLE PORT N/A 01/09/2021   Procedure: LAPAROSCOPIC CHOLECYSTECTOMY WITH IOC;  Surgeon: Sheldon Standing, MD;  Location: [facility];  Service: General;  Laterality: N/A;  90 MIN   TRANSCAROTID ARTERY REVASCULARIZATION  Left 05/12/2021   Procedure: LEFT TRANSCAROTID ARTERY REVASCULARIZATION;  Surgeon: Gretta Lonni PARAS, MD;  Location: [facility] OR;  Service: Vascular;  Laterality: Left;   Social History:  reports that he quit smoking about 53 years ago. His smoking use included cigarettes. He started smoking about 63 years ago. He has never used smokeless tobacco. He reports that he does not drink alcohol and does not use drugs.  Allergies  Allergen Reactions   Fluconazole Dermatitis and Rash   Propofol  Other (See Comments)    Hiccups for weeks   Lisinopril Cough   Fluoxetine Nausea Only and Other (See Comments)    Hallucinations, Feeling nervous, Nausea, Tachycardia also   Nystatin Rash   Sulfamethoxazole -Trimethoprim  Diarrhea    Family History  Problem Relation Age of Onset   Diabetes Sister    Lung cancer Brother        Post 9/11 voluntary work in [employer]   Prostate cancer Brother    Other Mother        passed away young- non medical   Alzheimer's disease Father    Healthy Son     Prior to Admission medications   Medication Sig Start Date End Date Taking? Authorizing Provider  acetaminophen  (TYLENOL ) 500 MG tablet Take 1,000 mg by mouth every 6 (six) hours as needed for mild pain (pain score 1-3), moderate pain (pain score 4-6) or fever.    [provider]  apixaban  (ELIQUIS ) 5 MG TABS tablet Take 1 tablet (5 mg total) by mouth 2 (two) times daily. 05/27/21   Love, Sharlet RAMAN, PA-C  ascorbic acid (VITAMIN C) 250 MG tablet Take 250 mg by mouth in the morning and at bedtime.    [provider]  aspirin  EC 81 MG tablet Take 81  mg by mouth daily. Swallow whole.    [provider]  bacitracin 500 UNIT/GM ointment Apply 1 Application topically daily as needed for wound care (near catheter).    [provider]  busPIRone  (BUSPAR ) 10 MG tablet Take 5-10 mg by mouth See admin instructions. Take 10 mg by mouth in the morning and 5 mg at noon & bedtime    [provider]  carbidopa -levodopa  (SINEMET ) 25-100 MG tablet Take 2-3 tablets by mouth See admin instructions. Take 3 tablets by mouth at 7 AM & 12 Noon and 2 tablets between 5-6 PM/after the evening meal    [provider]  Cholecalciferol 50 MCG (2000 UT) TABS Take 2,000 Units by mouth daily.    [provider]  Dupilumab 300 MG/2ML SOAJ Inject 300 mg into the skin.    [provider]  HYDROCORTISONE  ACE, RECTAL, 30 MG SUPP Place 30  mg rectally at bedtime.    [provider]  melatonin 3 MG TABS tablet Take 6 mg by mouth at bedtime.    [provider]  memantine  (NAMENDA ) 10 MG tablet Take 20 mg by mouth at bedtime.    [provider]  metFORMIN  (GLUCOPHAGE ) 500 MG tablet Take 500 mg by mouth at bedtime.    [provider]  methenamine (MANDELAMINE) 1 g tablet Take 1,000 mg by mouth See admin instructions. Crush 1,000 mg and place into applesauce- take morning and bedtime    [provider]  miconazole (MICOTIN) 2 % powder Apply 1 Application topically See admin instructions. Apply in the groin area once a day as needed    [provider]  nystatin-triamcinolone ointment (MYCOLOG) Apply 1 application  topically as needed (rash/yeast). 10/26/21   [provider]  omeprazole (PRILOSEC) 20 MG capsule Take 20 mg by mouth daily before breakfast.    [provider]  oxybutynin  (DITROPAN ) 5 MG tablet Take 2.5 mg by mouth in the morning and at bedtime.    [provider]  Pimavanserin  Tartrate (NUPLAZID ) 34 MG CAPS Take 34 mg by mouth daily.    [provider]  psyllium (METAMUCIL) 58.6 % powder See admin instructions. Mix 2 teaspoonsful of powder into 16 ounces of water and drink by mouth once a day    [provider]  rasagiline  (AZILECT ) 1 MG TABS tablet Take 1 mg by mouth daily.    [provider]  senna-docusate (SENOKOT-S) 8.6-50 MG tablet Take 2 tablets by mouth daily as needed for mild constipation or moderate constipation.    [provider]  simvastatin  (ZOCOR ) 20 MG tablet Take 10 mg by mouth at bedtime.    [provider]  tacrolimus (PROTOPIC) 0.1 % ointment Apply 1 application  topically See admin instructions. Apply to groin, armpits, and bottom 2 times a day when skin is broken out 11/06/21   [provider]  triamcinolone cream (KENALOG) 0.1 % Apply 1 Application topically daily as needed (rash).    [provider]  zinc  oxide 20 % ointment Apply 1 Application topically See admin instructions. Every time there is a diaper change Patient taking differently: Apply 1 Application topically See admin instructions. Apply to affected areas with every pad change 01/27/24   [name], MD    Physical Exam: Vitals:   09/08/24 0531  BP: (!) 169/85  Pulse: (!) 110  Resp: 17  Temp: 100.3 F (37.9 C)  TempSrc: Oral  SpO2: 96%   Physical Exam Vitals and nursing note reviewed.  Constitutional:      General: He is awake. He is not in acute distress.    Appearance: Normal appearance. He is ill-appearing.  HENT:     Head: Normocephalic.     Nose: No rhinorrhea.     Mouth/Throat:     Mouth: Mucous membranes are moist.  Eyes:     General: No scleral icterus.    Pupils: Pupils are equal, round, and reactive to light.  Neck:     Vascular: No JVD.  Cardiovascular:     Rate and Rhythm: Normal rate and regular rhythm.     Heart sounds: S1 normal and S2 normal.  Pulmonary:     Effort: Pulmonary effort is normal.     Breath sounds: Normal breath sounds. No wheezing,  rhonchi or rales.  Abdominal:     General: Bowel sounds are normal. There is no distension.  Palpations: Abdomen is soft.     Tenderness: There is no abdominal tenderness. There is no right CVA tenderness or left CVA tenderness.  Musculoskeletal:     Cervical back: Neck supple.     Right lower leg: 1+ Pitting Edema present.     Left lower leg: 1+ Pitting Edema present.  Skin:    General: Skin is warm and dry.  Neurological:     Mental Status: He is alert. Mental status is at baseline. He is disoriented.  Psychiatric:        Mood and Affect: Mood normal.        Behavior: Behavior normal. Behavior is cooperative.     Data Reviewed:  Results are pending, will review when available. 05/14/2021 echocardiogram report. IMPRESSIONS:   1. Left ventricular ejection fraction, by estimation, is 55 to 60%. The  left ventricle has normal function. The left ventricle has no regional  wall motion abnormalities. There is mild left ventricular hypertrophy.  Left ventricular diastolic parameters  are consistent with Grade I diastolic dysfunction (impaired relaxation).   2. Right ventricular systolic function is normal. The right ventricular  size is normal. Tricuspid regurgitation signal is inadequate for assessing  PA pressure.   3. The mitral valve is normal in structure. No evidence of mitral valve  regurgitation. No evidence of mitral stenosis.   4. The aortic valve is tricuspid. Aortic valve regurgitation is not  visualized. Mild aortic valve sclerosis is present, with no evidence of  aortic valve stenosis.   5. Aortic dilatation noted. There is mild dilatation of the ascending  aorta, measuring 38 mm.   6. The inferior vena cava is dilated in size with <50% respiratory  variability, suggesting right atrial pressure of 15 mmHg.    Assessment and Plan: Principal Problem:   Fever Associated with:   Acute metabolic encephalopathy In the setting of:   Catheter-associated urinary  tract infection Due to history of:   Neurogenic bladder And:   BPH/urethral stricture with suprapubic catheter Admit to PCU/inpatient. ESBL UTI history earlier this year. Will begin meropenem  1 g IVPB every 8 hours. Follow-up blood culture and sensitivity. Follow-up urine culture and sensitivity. Follow-up CBC and chemistry in the morning.  Active Problems:   Lactic acidosis Initially thought to be septic, but full criteria not met. Lactic acid level has normalized after 2 L of IV fluid.    History of CVA with residual left hemiparesis   Spasticity as late effect of cerebrovascular accident (CVA) Continue supportive care.. Muscle relaxants as needed. Consider physical therapy evaluation.    DM2 (diabetes mellitus, type 2) (HCC) Carbohydrate modified diet. Continue metformin  500 mg p.o. bedtime. CBG monitoring with RI SS. Check hemoglobin A1c.    HLD (hyperlipidemia) Continue simvastatin  20 mg p.o. daily.    History of DVT (deep vein thrombosis) Continue apixaban  5 mg p.o. twice daily.    Anxiety Continue buspirone  10 mg p.o. daily.    Cognitive communication disorder Continue memantine  10 mg p.o. twice daily.    Parkinson's disease (HCC) Continue Sinemet  25-100 mg p.o. 3 tablets p.o. twice daily. Continue Sinemet  25-100 mg p.o. 2 tablets in the evening. Continue rasagiline  1 mg p.o. daily. Continue pimavanserin  34 mg p.o. daily.    Chronic diastolic heart failure (HCC) Seems to be euvolemic at the moment    Gastroesophageal reflux disease without esophagitis Continue omeprazole or formulary equivalent.    Advance Care Planning:   Code Status: Full Code   Consults: His wife and his  caregiver are at bedside.  Family Communication:   Severity of Illness: The appropriate patient status for this patient is INPATIENT. Inpatient status is judged to be reasonable and necessary in order to provide the required intensity of service to ensure the patient's safety.  The patient's presenting symptoms, physical exam findings, and initial radiographic and laboratory data in the context of their chronic comorbidities is felt to place them at high risk for further clinical deterioration. Furthermore, it is not anticipated that the patient will be medically stable for discharge from the hospital within 2 midnights of admission.   * I certify that at the point of admission it is my clinical judgment that the patient will require inpatient hospital care spanning beyond 2 midnights from the point of admission due to high intensity of service, high risk for further deterioration and high frequency of surveillance required.*  Author: Alm Dorn Castor, MD 09/08/2024 9:03 AM  For on call review [email or web address].   This document was prepared using Dragon voice recognition software and may contain some unintended transcription errors.

## 2024-09-08 NOTE — Plan of Care (Signed)

## 2024-09-08 NOTE — ED Provider Notes (Signed)
 [facility]PARTMENT AT [facility] Provider Note   CSN: [id number] Arrival date & time: 09/08/24  [id number]     Patient presents with: Altered Mental Status   Robert [name] is a 78 y.o. male.   78 yo M with a chief complaints of fatigue and worsening confusion.  Going on for about a week.  His wife is at bedside and provides most of the history.  She feels that he likely has a urinary tract infection.  Has struggled with this off and on.  Was seen recently at the [facility] and had his suprapubic catheter changed yesterday.  They had recommended holding off on antibiotics but since then the patient has become more fatigued.  Unable to take his home medicines due to confusion.  Brought in for evaluation.  She denies any recent medication changes otherwise.  Denies cough congestion or fever.  Denies abdominal pain.  Denies chest pain.   Altered Mental Status      Prior to Admission medications   Medication Sig Start Date End Date Taking? Authorizing Provider  acetaminophen  (TYLENOL ) 500 MG tablet Take 1,000 mg by mouth every 6 (six) hours as needed for mild pain (pain score 1-3), moderate pain (pain score 4-6) or fever.    [provider]  apixaban  (ELIQUIS ) 5 MG TABS tablet Take 1 tablet (5 mg total) by mouth 2 (two) times daily. 05/27/21   Love, Sharlet RAMAN, PA-C  ascorbic acid (VITAMIN C) 250 MG tablet Take 250 mg by mouth in the morning and at bedtime.    [provider]  aspirin  EC 81 MG tablet Take 81 mg by mouth daily. Swallow whole.    [provider]  bacitracin 500 UNIT/GM ointment Apply 1 Application topically daily as needed for wound care (near catheter).    [provider]  busPIRone  (BUSPAR ) 10 MG tablet Take 5-10 mg by mouth See admin instructions. Take 10 mg by mouth in the morning and 5 mg at noon & bedtime    [provider]  carbidopa -levodopa  (SINEMET ) 25-100 MG tablet Take 2-3 tablets by mouth See admin  instructions. Take 3 tablets by mouth at 7 AM & 12 Noon and 2 tablets between 5-6 PM/after the evening meal    [provider]  Cholecalciferol 50 MCG (2000 UT) TABS Take 2,000 Units by mouth daily.    [provider]  Dupilumab 300 MG/2ML SOAJ Inject 300 mg into the skin.    [provider]  HYDROCORTISONE  ACE, RECTAL, 30 MG SUPP Place 30 mg rectally at bedtime.    [provider]  melatonin 3 MG TABS tablet Take 6 mg by mouth at bedtime.    [provider]  memantine  (NAMENDA ) 10 MG tablet Take 20 mg by mouth at bedtime.    [provider]  metFORMIN  (GLUCOPHAGE ) 500 MG tablet Take 500 mg by mouth at bedtime.    [provider]  methenamine (MANDELAMINE) 1 g tablet Take 1,000 mg by mouth See admin instructions. Crush 1,000 mg and place into applesauce- take morning and bedtime    [provider]  miconazole (MICOTIN) 2 % powder Apply 1 Application topically See admin instructions. Apply in the groin area once a day as needed    [provider]  nystatin-triamcinolone ointment (MYCOLOG) Apply 1 application  topically as needed (rash/yeast). 10/26/21   [provider]  omeprazole (PRILOSEC) 20 MG capsule Take 20 mg by mouth daily before breakfast.    [provider]  oxybutynin  (DITROPAN ) 5 MG tablet Take 2.5 mg by mouth in the morning and at bedtime.    [provider]  Pimavanserin  Tartrate (NUPLAZID ) 34 MG CAPS Take 34 mg by mouth daily.    [provider]  psyllium (METAMUCIL) 58.6 % powder See admin instructions. Mix 2 teaspoonsful of powder into 16 ounces of water and drink by mouth once a day    [provider]  rasagiline  (AZILECT ) 1 MG TABS tablet Take 1 mg by mouth daily.    [provider]  senna-docusate (SENOKOT-S) 8.6-50 MG tablet Take 2 tablets by mouth daily as needed for mild constipation or moderate constipation.    [provider]   simvastatin  (ZOCOR ) 20 MG tablet Take 10 mg by mouth at bedtime.    [provider]  tacrolimus (PROTOPIC) 0.1 % ointment Apply 1 application  topically See admin instructions. Apply to groin, armpits, and bottom 2 times a day when skin is broken out 11/06/21   [provider]  triamcinolone cream (KENALOG) 0.1 % Apply 1 Application topically daily as needed (rash).    [provider]  zinc  oxide 20 % ointment Apply 1 Application topically See admin instructions. Every time there is a diaper change Patient taking differently: Apply 1 Application topically See admin instructions. Apply to affected areas with every pad change 01/27/24   [name], MD    Allergies: Fluconazole, Propofol , Lisinopril, Fluoxetine, Nystatin, and Sulfamethoxazole -trimethoprim     Review of Systems  Updated Vital Signs BP (!) 153/75 (BP Location: Left Arm)   Pulse 100   Temp 100.3 F (37.9 C) (Oral)   Resp 14   SpO2 99%   Physical Exam Vitals and nursing note reviewed.  Constitutional:      Appearance: He is well-developed.  HENT:     Head: Normocephalic and atraumatic.  Eyes:     Pupils: Pupils are equal, round, and reactive to light.  Neck:     Vascular: No JVD.  Cardiovascular:     Rate and Rhythm: Normal rate and regular rhythm.     Heart sounds: No murmur heard.    No friction rub. No gallop.  Pulmonary:     Effort: No respiratory distress.     Breath sounds: No wheezing.  Abdominal:     General: There is no distension.     Tenderness: There is no abdominal tenderness. There is no guarding or rebound.  Musculoskeletal:        General: Normal range of motion.     Cervical back: Normal range of motion and neck supple.  Skin:    Coloration: Skin is not pale.     Findings: No rash.  Neurological:     Mental Status: He is alert and oriented to person, place, and time.     Comments: Left-sided weakness with contractures  Psychiatric:        Behavior: Behavior  normal.     (all labs ordered are listed, but only abnormal results are displayed) Labs Reviewed  CBC - Abnormal; Notable for the following components:      Result Value   WBC 11.5 (*)    All other components within normal limits  COMPREHENSIVE METABOLIC PANEL WITH GFR - Abnormal; Notable for the following components:   Glucose, Bld 176 (*)    AST 10 (*)    All other components within normal limits  URINALYSIS, ROUTINE W REFLEX MICROSCOPIC - Abnormal; Notable for the following components:   APPearance HAZY (*)  Hgb urine dipstick SMALL (*)    Protein, ur 30 (*)    Leukocytes,Ua MODERATE (*)    Bacteria, UA FEW (*)    All other components within normal limits  I-STAT CG4 LACTIC ACID, ED - Abnormal; Notable for the following components:   Lactic Acid, Venous 2.2 (*)    All other components within normal limits  I-STAT CG4 LACTIC ACID, ED - Abnormal; Notable for the following components:   Lactic Acid, Venous 2.1 (*)    All other components within normal limits  I-STAT CG4 LACTIC ACID, ED - Abnormal; Notable for the following components:   Lactic Acid, Venous 2.8 (*)    All other components within normal limits  CBG MONITORING, ED - Abnormal; Notable for the following components:   Glucose-Capillary 111 (*)    All other components within normal limits  RESP PANEL BY RT-PCR (RSV, FLU A&B, COVID)  RVPGX2  CULTURE, BLOOD (ROUTINE X 2)  CULTURE, BLOOD (ROUTINE X 2)  APTT  LACTIC ACID, PLASMA  PROTIME-INR  HEMOGLOBIN A1C  I-STAT CG4 LACTIC ACID, ED  CBG MONITORING, ED    EKG: None  Radiology: DG Chest 2 View Result Date: 09/08/2024 EXAM: 2 VIEW(S) XRAY OF THE CHEST 09/08/2024 08:32:13 AM COMPARISON: 04/10/2024 CLINICAL HISTORY: fever FINDINGS: LUNGS AND PLEURA: No focal pulmonary opacity. No pleural effusion. No pneumothorax. HEART AND MEDIASTINUM: Stable cardiomediastinal silhouette. BONES AND SOFT TISSUES: Degenerative changes are seen involving both glenohumeral joints.  No acute osseous abnormality. IMPRESSION: 1. No acute cardiopulmonary process. Electronically signed by: Lynwood Seip MD 09/08/2024 08:36 AM EST RP Workstation: [id number]   CT Head Wo Contrast Result Date: 09/08/2024 EXAM: CT HEAD WITHOUT CONTRAST 09/08/2024 07:30:00 AM TECHNIQUE: CT of the head was performed without the administration of intravenous contrast. Automated exposure control, iterative reconstruction, and/or weight based adjustment of the mA/kV was utilized to reduce the radiation dose to as low as reasonably achievable. COMPARISON: Head CT 03/31/2024 CLINICAL HISTORY: Mental status change, unknown cause FINDINGS: BRAIN AND VENTRICLES: No acute hemorrhage. No evidence of acute infarct. No hydrocephalus. No extra-axial collection. No mass effect or edema are seen. No midline shift. There is encephalomalacia due to a large chronic right MCA infarct centered in the frontotemporal area but involving portions of the parietal lobe. Ex vacuo prominence of the right lateral ventricle is seen. There is background moderate cerebral atrophy, mild to moderate cerebellar atrophy, small vessel disease of the cerebral white matter. Dystrophic calcifications extend along the falx. No hyperdense vessels. ORBITS: No acute abnormality. SINUSES: No acute abnormality. SOFT TISSUES AND SKULL: No acute soft tissue abnormality. No skull fracture. IMPRESSION: 1. No acute intracranial CT findings. 2. Encephalomalacia from a large remote chronic right MCA territory infarct with ex vacuo prominence of the right lateral ventricle without midline shift. Electronically signed by: Francis Quam MD 09/08/2024 07:41 AM EST RP Workstation: [id number]     Procedures   Medications Ordered in the ED  meropenem  (MERREM ) 1 g in sodium chloride  0.9 % 100 mL IVPB (0 g Intravenous Stopped 09/08/24 1042)  insulin  aspart (novoLOG ) injection 0-15 Units ( Subcutaneous Not Given 09/08/24 1221)  acetaminophen  (TYLENOL ) tablet 650 mg (has  no administration in time range)    Or  acetaminophen  (TYLENOL ) suppository 650 mg (has no administration in time range)  ondansetron  (ZOFRAN ) tablet 4 mg (has no administration in time range)    Or  ondansetron  (ZOFRAN ) injection 4 mg (has no administration in time range)  ceFEPIme  (MAXIPIME ) 2 g in sodium chloride   0.9 % 100 mL IVPB (0 g Intravenous Stopped 09/08/24 0934)  lactated ringers  bolus 1,000 mL (0 mLs Intravenous Stopped 09/08/24 1042)  sodium chloride  0.9 % bolus 1,000 mL (1,000 mLs Intravenous New Bag/Given 09/08/24 1042)                                    Medical Decision Making Amount and/or Complexity of Data Reviewed Radiology: ordered.  Risk Decision regarding hospitalization.   78 yo M with a chief complaints of fatigue and worsening confusion.  Patient with a suprapubic catheter.  Was replaced yesterday.  Struggled with infections off and on.  Looks like most recently on Macrobid.  Wife says he is unable to take his medications due to confusion at home.    Lactate elevated here.  Will give a bolus of IV fluids.  I do not see any obvious culture data in the [facility] notes.  Through our system patient has had multiple resistant urinary tract infections.  Has had pseudomonal infection in the past as well.  Will start him on cefepime .  CT head without obvious acute pathology.   Chest x-ray independently interpreted by me without focal infiltrate or pneumothorax.  Will discuss with medicine for admission.  The patients results and plan were reviewed and discussed.   Any x-rays performed were independently reviewed by myself.   Differential diagnosis were considered with the presenting HPI.  Medications  meropenem  (MERREM ) 1 g in sodium chloride  0.9 % 100 mL IVPB (0 g Intravenous Stopped 09/08/24 1042)  insulin  aspart (novoLOG ) injection 0-15 Units ( Subcutaneous Not Given 09/08/24 1221)  acetaminophen  (TYLENOL ) tablet 650 mg (has no administration in time range)     Or  acetaminophen  (TYLENOL ) suppository 650 mg (has no administration in time range)  ondansetron  (ZOFRAN ) tablet 4 mg (has no administration in time range)    Or  ondansetron  (ZOFRAN ) injection 4 mg (has no administration in time range)  ceFEPIme  (MAXIPIME ) 2 g in sodium chloride  0.9 % 100 mL IVPB (0 g Intravenous Stopped 09/08/24 0934)  lactated ringers  bolus 1,000 mL (0 mLs Intravenous Stopped 09/08/24 1042)  sodium chloride  0.9 % bolus 1,000 mL (1,000 mLs Intravenous New Bag/Given 09/08/24 1042)    Vitals:   09/08/24 0531 09/08/24 0904 09/08/24 1152  BP: (!) 169/85 (!) 153/75   Pulse: (!) 110 100   Resp: 17 14   Temp: 100.3 F (37.9 C) 98.9 F (37.2 C) 100.3 F (37.9 C)  TempSrc: Oral Oral Oral  SpO2: 96% 99%     Final diagnoses:  Lower urinary tract infectious disease    Admission/ observation were discussed with the admitting physician, patient and/or family and they are comfortable with the plan.       Final diagnoses:  Lower urinary tract infectious disease    ED Discharge Orders     None          Emil Share, DO 09/08/24 1230

## 2024-09-08 NOTE — ED Notes (Signed)
 Pt had small liquid BM; new pressure pad applied

## 2024-09-08 NOTE — Progress Notes (Signed)
 ED Pharmacy Antibiotic Sign Off An antibiotic consult was received from an ED provider for cefepime  per pharmacy dosing for UTI. A chart review was completed to assess appropriateness.   The following one time order(s) were placed:  Cefepime  2 g IV  Further antibiotic and/or antibiotic pharmacy consults should be ordered by the admitting provider if indicated.   Thank you for allowing pharmacy to be a part of this patient's care.   Eleanor Agent, [facility]  Clinical Pharmacist 09/08/24 7:35 AM

## 2024-09-09 DIAGNOSIS — N390 Urinary tract infection, site not specified: Secondary | ICD-10-CM

## 2024-09-09 LAB — COMPREHENSIVE METABOLIC PANEL WITH GFR
ALT: <5 U/L (ref 0–44)
AST: 13 U/L — ABNORMAL LOW (ref 15–41)
Albumin: 3.2 g/dL — ABNORMAL LOW (ref 3.5–5.0)
Alkaline Phosphatase: 59 U/L (ref 38–126)
Anion gap: 9 (ref 5–15)
BUN: 14 mg/dL (ref 8–23)
CO2: 25 mmol/L (ref 22–32)
Calcium: 8.9 mg/dL (ref 8.9–10.3)
Chloride: 106 mmol/L (ref 98–111)
Creatinine, Ser: 0.86 mg/dL (ref 0.61–1.24)
GFR, Estimated: >60 mL/min (ref >60)
Glucose, Bld: 113 mg/dL — ABNORMAL HIGH (ref 70–99)
Potassium: 3.9 mmol/L (ref 3.5–5.1)
Sodium: 141 mmol/L (ref 135–145)
Total Bilirubin: 0.8 mg/dL (ref 0.0–1.2)
Total Protein: 6 g/dL — ABNORMAL LOW (ref 6.5–8.1)

## 2024-09-09 LAB — CBC WITH DIFFERENTIAL/PLATELET
Abs Immature Granulocytes: 0.03 K/uL (ref 0.00–0.07)
Basophils Absolute: 0.1 K/uL (ref 0.0–0.1)
Basophils Relative: 1 %
Eosinophils Absolute: 0.8 K/uL — ABNORMAL HIGH (ref 0.0–0.5)
Eosinophils Relative: 9 %
HCT: 39.9 % (ref 39.0–52.0)
Hemoglobin: 13.2 g/dL (ref 13.0–17.0)
Immature Granulocytes: 0 %
Lymphocytes Relative: 13 %
Lymphs Abs: 1.2 K/uL (ref 0.7–4.0)
MCH: 29.7 pg (ref 26.0–34.0)
MCHC: 33.1 g/dL (ref 30.0–36.0)
MCV: 89.9 fL (ref 80.0–100.0)
Monocytes Absolute: 1.2 K/uL — ABNORMAL HIGH (ref 0.1–1.0)
Monocytes Relative: 13 %
Neutro Abs: 5.7 K/uL (ref 1.7–7.7)
Neutrophils Relative %: 64 %
Platelets: 214 K/uL (ref 150–400)
RBC: 4.44 MIL/uL (ref 4.22–5.81)
RDW: 15.2 % (ref 11.5–15.5)
WBC: 8.9 K/uL (ref 4.0–10.5)
nRBC: 0 % (ref 0.0–0.2)

## 2024-09-09 LAB — GLUCOSE, CAPILLARY
Glucose-Capillary: 143 mg/dL — ABNORMAL HIGH (ref 70–99)
Glucose-Capillary: 189 mg/dL — ABNORMAL HIGH (ref 70–99)
Glucose-Capillary: 128 mg/dL — ABNORMAL HIGH (ref 70–99)
Glucose-Capillary: 152 mg/dL — ABNORMAL HIGH (ref 70–99)

## 2024-09-09 MED ORDER — LACTATED RINGERS IV SOLN: INTRAVENOUS | Status: AC

## 2024-09-09 MED ORDER — GLUCERNA SHAKE PO LIQD
237.0000 mL | Freq: Three times a day (TID) | ORAL | Status: DC
  Administered 2024-09-09 – 2024-09-11 (×3): 237 mL via ORAL
  Filled 2024-09-09 (×8): qty 237

## 2024-09-09 MED ORDER — RINGERS IV SOLN: INTRAVENOUS | Status: DC

## 2024-09-09 NOTE — Evaluation (Signed)
 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   Diabetes mellitus without complication (HCC)    Diverticulosis    Frequency of urination    Gallstone pancreatitis 01/07/2021   GERD (gastroesophageal reflux disease)    Gross hematuria    History of acute pyelonephritis    10-13-2012   History of CVA with residual deficit    2008--  left side of body weakness and foot drop (wears leg brace and uses cane)   History of DVT of lower extremity    2008--  cva   Hypercholesteremia    Hyperlipidemia    Hyperlipidemia    Hypertension    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 2008   Left leg DVT (HCC)    Neuromuscular disorder (HCC)    Parkinsons   S/P insertion of IVC (inferior vena caval) filter    2008   Urethral stricture    Urgency of urination    Urinary retention    Weakness of left side of body    secondary to cva 2008   Wears glasses    Wears hearing aid    bilateral-- wears intermittantly   Past Surgical History:  Past Surgical History:  Procedure Laterality Date   CYSTOSCOPY WITH RETROGRADE URETHROGRAM N/A 10/23/2015   Procedure: CYSTOSCOPY WITH RETROGRADE URETHROGRAM;  Surgeon: Alm Fragmin, MD;  Location: [facility];  Service: Urology;  Laterality: N/A;   CYSTOSCOPY WITH URETHRAL DILATATION N/A 10/23/2015   Procedure: CYSTOSCOPY WITH URETHRAL BALLOON DILATATION;  Surgeon: Alm Fragmin, MD;  Location: [facility];  Service: Urology;  Laterality: N/A;  BALLOON DILATION    IR CYSTOSTOMY TUBE CHANGE COMPLICATED W  IMG  05/22/2024   IVC FILTER PLACEMENT ([facility] HX)  2008   LAPAROSCOPIC CHOLECYSTECTOMY SINGLE PORT N/A 01/09/2021   Procedure: LAPAROSCOPIC CHOLECYSTECTOMY WITH IOC;  Surgeon: Sheldon Standing, MD;  Location: [facility];  Service: General;  Laterality: N/A;  90 MIN   TRANSCAROTID ARTERY REVASCULARIZATION  Left 05/12/2021   Procedure: LEFT TRANSCAROTID ARTERY REVASCULARIZATION;  Surgeon: Gretta Lonni PARAS, MD;  Location: [facility];  Service: Vascular;  Laterality: Left;   HPI:  Pt is a 78y.o. 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 Admitted 11/14 due to AMS and fever. Previously seen by ST with recent MBS 10/24 recommending Dysphagia 3(chopped)/thin liquids with moderate oropharyngeal dysphagia.  ST consulted for clinical swallow evaluation.    Assessment / Plan / Recommendation  Clinical Impression  Recommend pt/family preferred Dysphagia 2(minced)/nectar-thickened liquids with swallow precautions in place/FULL supervision/A with meals.  Thin liquids allowed in small amounts between meals after oral care completion.  For ease of consumption, medications beneficial when crushed in puree.  ST will f/u in acute setting for dysphagia tx/management.  Pt seen for clinical swallow evaluation with slight oral retention/holding prior to swallow and delayed initiation of the swallow observed with multiple swallows noted with larger volumes.  Minimal verbal cues to swallow A with increasing speed of  swallow/sustained attention to task.  No overt s/s of aspiration observed with tsp/cup and/or small sips via straw of thin liquids.  Wife reports buccal retention intermittently during meals and impaired mastication d/t requiring new dentition (partial), but appt has not been completed.  Wife prefers nectar-thickened liquids with thin liquids intermittently between meals after oral care when education  provided/aspiration and swallowing precautions discussed d/t nature of PD/risk for aspiration when deconditioned.    SLP Visit Diagnosis: Dysphagia, oropharyngeal phase (R13.12)    Aspiration Risk  Mild aspiration risk    Diet Recommendation   Nectar;Dysphagia 2 (chopped);Other (Comment) (family request)  Medication Administration: Crushed with puree    Other  Recommendations Oral Care Recommendations: Oral care BID;Oral care prior to ice chip/H20;Staff/trained caregiver to provide oral care     Assistance Recommended at Discharge  FULL  Functional Status Assessment Patient has had a recent decline in their functional status and demonstrates the ability to make significant improvements in function in a reasonable and predictable amount of time.  Frequency and Duration min 1 x/week  1 week       Prognosis Prognosis for improved oropharyngeal function: Good      Swallow Study   General Date of Onset: 09/08/24 HPI: Pt is a 78y.o. 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 Admitted 11/14 due to AMS and fever. Previously seen by ST with recent MBS 10/24 recommending Dysphagia 3(chopped)/thin liquids with moderate oropharyngeal dysphagia.  ST consulted for clinical swallow evaluation. Type of Study: Bedside Swallow Evaluation Previous Swallow Assessment: MBS 08/19/23 with recs for Dysphagia 3/thin liquids; BSE completed 04/01/24 with same recommendations. Diet Prior to this Study: Dysphagia 3 (mechanical soft);Mildly thick liquids (Level 2, nectar thick) Temperature Spikes Noted: Yes (low grade, 99) Respiratory Status: Room air History of Recent Intubation: No Behavior/Cognition: Alert;Cooperative Oral Cavity Assessment: Within Functional Limits Oral Care Completed by SLP: No Oral Cavity - Dentition: Missing dentition;Adequate natural dentition (limited dentition with wife  stating he needs an appt for a partial) Vision: Functional for self-feeding Self-Feeding Abilities: Needs assist Patient Positioning: Upright in bed Baseline Vocal Quality: Low vocal intensity Volitional Cough: Strong Volitional Swallow: Able to elicit    Oral/Motor/Sensory Function Overall Oral Motor/Sensory Function: Mild impairment Facial ROM: Reduced left;Other (Comment) (slight) Facial Symmetry: Abnormal symmetry left   Ice Chips Ice chips: Impaired Presentation: Spoon Oral Phase Impairments: Impaired mastication Oral Phase Functional Implications: Prolonged oral transit;Oral holding Pharyngeal Phase Impairments: Suspected delayed Swallow   Thin Liquid Thin Liquid: Impaired Presentation: Spoon;Straw;Cup Oral Phase Functional Implications: Oral holding Pharyngeal  Phase Impairments: Suspected delayed Swallow;Multiple swallows Other Comments: Pt stated he sometimes holds in mouth prior to swallowing and uses multiple swallows to get liquid down    Nectar Thick Nectar Thick Liquid: Not tested   Honey Thick Honey Thick Liquid: Not tested   Puree Puree: Impaired Presentation: Spoon Oral Phase Impairments: Impaired mastication;Reduced lingual movement/coordination Oral Phase Functional Implications: Prolonged oral transit;Oral holding Pharyngeal Phase Impairments: Suspected delayed Swallow   Solid     Solid: Impaired Presentation: Spoon Oral Phase Impairments: Reduced lingual movement/coordination;Impaired mastication Oral Phase Functional Implications: Impaired mastication;Prolonged oral transit Pharyngeal Phase Impairments: Suspected delayed Swallow      Pat Samyukta Cura,M.S.,CCC-SLP 09/09/2024,4:55 PM

## 2024-09-09 NOTE — Progress Notes (Signed)
 PROGRESS NOTE    Robert [name]  FMW:[id number] DOB: 1946/06/18 DOA: 09/08/2024 PCP: [facility]   Brief Narrative: 78 year old with past medical history significant for anemia, BPH, gross hematuria, history of urinary retention, chronic indwelling Foley catheter, carotid artery occlusion, CVA left-sided hemiparesis, left foot drop, diabetes type 2, care history of DVT, hypertension chronic systolic heart failure presents to the ED with altered mental status after waking up with fevers the morning of admission.  He has nonproductive cough.  Evaluation in the ED UA with moderate leukocyte 21-50 red blood cell greater than 50 white blood cell.  RSV coronavirus negative.  Lactic acid 2.2 chest x-ray no acute cardiopulmonary process.  CT head without contrast no acute intracranial findings.  There is encephalomalacia from large remote chronic right MCA territory infarct with's prominence of the right lateral ventricle without midline shift.   Assessment & Plan:   Principal Problem:   Catheter-associated urinary tract infection Active Problems:   History of CVA with residual left hemiparesis   BPH/urethral stricture with suprapubic catheter   DM2 (diabetes mellitus, type 2) (HCC)   Fever   HLD (hyperlipidemia)   History of DVT (deep vein thrombosis)   Parkinson's disease (HCC)   Spasticity as late effect of cerebrovascular accident (CVA)   Acute metabolic encephalopathy   Chronic diastolic heart failure (HCC)   Lactic acidosis   Anxiety   Cognitive communication disorder   Gastroesophageal reflux disease without esophagitis   Neurogenic bladder  Acute metabolic/toxic encephalopathy in the setting of fever and UTI; - Patient present with altered mental status, more confused, has been more sleepy and eating less at home. - In the setting of urinary tract infection. - Continue IV fluids and IV antibiotics - PT OT consulted, for mobilization  Catheter associated urinary  tract infection BPH ureteral stricture with suprapubic catheter Neurogenic bladder - Presented with low-grade fever, mild leukocytosis, UA with more than 50 white blood cells. - Urine culture growing more than 100 K colonies - Continue meropenem , history of ESBL He is catheter was changed last Thursday Follow blood cultures   Lactic acidosis: - In the setting of infection and starvation. 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  PT OT speech consulted  Diabetes type 2 - Hold metformin  while inpatient. - Sliding scale insulin   Hyperlipidemia: - Continue simvastatin   History of DVT: - Continue Eliquis   Anxiety - Continue buspirone   Cognitive communication disorder: - Continue memantine    Parkinson's disease - Continue Sinemet , rasagiline  and pimavanserin    Chronic diastolic heart failure - Euvolemic  GERD: - PPI   See wound care documentation below Pressure injury buttock right left medial stage II POA Wound 09/08/24 1510 Pressure Injury Buttocks Right;Left;Medial Stage 2 -  Partial thickness loss of dermis presenting as a shallow open injury with a red, pink wound bed without slough. (Active)       Estimated body mass index is 28.06 kg/m as calculated from the following:   Height as of this encounter: 6' 4 (1.93 m).   Weight as of this encounter: 104.6 kg.   DVT prophylaxis: Eliquis  Code Status: Full code Family Communication: Wife who was at bedside Disposition Plan:  Status is: Inpatient Remains inpatient appropriate because: Management of UTI    Consultants:  None  Procedures:  None  Antimicrobials:    Subjective: He is sleepy, wakes up  to voice, reports doing okay.  He has left side weakness pronounced.   Objective: Vitals:   09/08/24 2050 09/08/24 2233 09/09/24 0015 09/09/24  0558  BP: (!) 99/54  (!) 125/59 (!) 154/95  Pulse: 84  63 79  Resp: 18  15 19   Temp: 98.6 F (37 C)  99.1 F (37.3 C) 99.4 F (37.4 C)  TempSrc: Oral  Oral Oral  SpO2: 96%  100% 98%  Weight:  104.6 kg    Height:  6' 4 (1.93 m)      Intake/Output Summary (Last 24 hours) at 09/09/2024 0742 Last data filed at 09/09/2024 0600 Gross per 24 hour  Intake 500 ml  Output 675 ml  Net -175 ml   Filed Weights   09/08/24 2233  Weight: 104.6 kg    Examination:  General exam: Appears calm and comfortable , frail Respiratory system: Clear to auscultation. Respiratory effort normal. Cardiovascular system: S1 & S2 heard, RRR. No JVD, murmurs, rubs, gallops or clicks. No pedal edema. Gastrointestinal system: Abdomen is nondistended, soft and nontender. No organomegaly or masses felt. Normal bowel sounds heard. Central nervous system: Alert and oriented.  Left-sided weakness Extremities: Symmetric 5 x 5 power.    Data Reviewed: I have personally reviewed following labs and imaging studies  CBC: Recent Labs  Lab 09/08/24 0545 09/09/24 0344  WBC 11.5* 8.9  NEUTROABS  --  5.7  HGB 14.7 13.2  HCT 45.2 39.9  MCV 90.8 89.9  PLT 223 214   Basic Metabolic Panel: Recent Labs  Lab 09/08/24 0545 09/09/24 0344  NA 137 141  K 4.0 3.9  CL 102 106  CO2 25 25  GLUCOSE 176* 113*  BUN 15 14  CREATININE 0.98 0.86  CALCIUM 9.2 8.9   GFR: Estimated Creatinine Clearance: 95.5 mL/min (by C-G formula based on SCr of 0.86 mg/dL). Liver Function Tests: Recent Labs  Lab 09/08/24 0545 09/09/24 0344  AST 10* 13*  ALT 13 <5  ALKPHOS 81 59  BILITOT 0.7 0.8  PROT 6.9 6.0*  ALBUMIN 3.7 3.2*   No results for input(s): LIPASE, AMYLASE in the last 168 hours. No results for input(s): AMMONIA in the last 168 hours. Coagulation Profile: Recent Labs  Lab 09/08/24 1148  INR 1.3*   Cardiac Enzymes: No results for input(s): CKTOTAL, CKMB, CKMBINDEX, TROPONINI in the last 168  hours. BNP (last 3 results) No results for input(s): PROBNP in the last 8760 hours. HbA1C: Recent Labs    09/08/24 1148  HGBA1C 6.3*   CBG: Recent Labs  Lab 09/08/24 1139 09/08/24 1154 09/08/24 1618 09/08/24 2059 09/09/24 0738  GLUCAP 95 111* 133* 191* 143*   Lipid Profile: No results for input(s): CHOL, HDL, LDLCALC, TRIG, CHOLHDL, LDLDIRECT in the last 72 hours. Thyroid  Function Tests: No results for input(s): TSH, T4TOTAL, FREET4, T3FREE, THYROIDAB in the last 72 hours. Anemia Panel: No results for input(s): VITAMINB12, FOLATE, FERRITIN, TIBC, IRON, RETICCTPCT in the last 72 hours. Sepsis Labs: Recent Labs  Lab 09/08/24 0759 09/08/24 0839 09/08/24 1156 09/08/24 1555  LATICACIDVEN 2.1* 2.8* 1.3 2.0*    Recent Results (from the past 240 hours)  Resp panel by RT-PCR (RSV, Flu A&B, Covid) Anterior Nasal Swab     Status: None   Collection Time: 09/08/24  8:39 AM   Specimen: Anterior Nasal Swab  Result Value Ref Range Status   SARS Coronavirus 2 by RT PCR NEGATIVE NEGATIVE Final    Comment: (NOTE) SARS-CoV-2 target nucleic acids are NOT DETECTED.  The  SARS-CoV-2 RNA is generally detectable in upper respiratory specimens during the acute phase of infection. The lowest concentration of SARS-CoV-2 viral copies this assay can detect is 138 copies/mL. A negative result does not preclude SARS-Cov-2 infection and should not be used as the sole basis for treatment or other patient management decisions. A negative result may occur with  improper specimen collection/handling, submission of specimen other than nasopharyngeal swab, presence of viral mutation(s) within the areas targeted by this assay, and inadequate number of viral copies(<138 copies/mL). A negative result must be combined with clinical observations, patient history, and epidemiological information. The expected result is Negative.  Fact Sheet for Patients:   bloggercourse.com  Fact Sheet for Healthcare Providers:  seriousbroker.it  This test is no t yet approved or cleared by the [place]  FDA and  has been authorized for detection and/or diagnosis of SARS-CoV-2 by FDA under an Emergency Use Authorization (EUA). This EUA will remain  in effect (meaning this test can be used) for the duration of the COVID-19 declaration under Section 564(b)(1) of the Act, 21 U.S.C.section 360bbb-3(b)(1), unless the authorization is terminated  or revoked sooner.       Influenza A by PCR NEGATIVE NEGATIVE Final   Influenza B by PCR NEGATIVE NEGATIVE Final    Comment: (NOTE) The Xpert Xpress SARS-CoV-2/FLU/RSV plus assay is intended as an aid in the diagnosis of influenza from Nasopharyngeal swab specimens and should not be used as a sole basis for treatment. Nasal washings and aspirates are unacceptable for Xpert Xpress SARS-CoV-2/FLU/RSV testing.  Fact Sheet for Patients: bloggercourse.com  Fact Sheet for Healthcare Providers: seriousbroker.it  This test is not yet approved or cleared by the [place]  FDA and has been authorized for detection and/or diagnosis of SARS-CoV-2 by FDA under an Emergency Use Authorization (EUA). This EUA will remain in effect (meaning this test can be used) for the duration of the COVID-19 declaration under Section 564(b)(1) of the Act, 21 U.S.C. section 360bbb-3(b)(1), unless the authorization is terminated or revoked.     Resp Syncytial Virus by PCR NEGATIVE NEGATIVE Final    Comment: (NOTE) Fact Sheet for Patients: bloggercourse.com  Fact Sheet for Healthcare Providers: seriousbroker.it  This test is not yet approved or cleared by the [place]  FDA and has been authorized for detection and/or diagnosis of SARS-CoV-2 by FDA under an Emergency Use  Authorization (EUA). This EUA will remain in effect (meaning this test can be used) for the duration of the COVID-19 declaration under Section 564(b)(1) of the Act, 21 U.S.C. section 360bbb-3(b)(1), unless the authorization is terminated or revoked.  Performed at [facility], [address] [place], [place] [address]   Blood culture (routine x 2)     Status: None (Preliminary result)   Collection Time: 09/08/24  3:55 PM   Specimen: BLOOD RIGHT ARM  Result Value Ref Range Status   Specimen Description   Final    BLOOD RIGHT ARM Performed at [facility], [address] [place], [place] [address]    Special Requests   Final    BOTTLES DRAWN AEROBIC AND ANAEROBIC Blood Culture adequate volume Performed at [facility], [address] [place], [place] [address]    Culture PENDING  Incomplete   Report Status PENDING  Incomplete         Radiology Studies: DG Chest 2 View Result Date: 09/08/2024 EXAM: 2 VIEW(S) XRAY OF THE CHEST 09/08/2024 08:32:13 AM COMPARISON: 04/10/2024 CLINICAL HISTORY: fever FINDINGS: LUNGS AND  PLEURA: No focal pulmonary opacity. No pleural effusion. No pneumothorax. HEART AND MEDIASTINUM: Stable cardiomediastinal silhouette. BONES AND SOFT TISSUES: Degenerative changes are seen involving both glenohumeral joints. No acute osseous abnormality. IMPRESSION: 1. No acute cardiopulmonary process. Electronically signed by: Lynwood Seip MD 09/08/2024 08:36 AM EST RP Workstation: [id number]   CT Head Wo Contrast Result Date: 09/08/2024 EXAM: CT HEAD WITHOUT CONTRAST 09/08/2024 07:30:00 AM TECHNIQUE: CT of the head was performed without the administration of intravenous contrast. Automated exposure control, iterative reconstruction, and/or weight based adjustment of the mA/kV was utilized to reduce the radiation dose to as low as reasonably achievable. COMPARISON: Head CT 03/31/2024 CLINICAL HISTORY: Mental status change,  unknown cause FINDINGS: BRAIN AND VENTRICLES: No acute hemorrhage. No evidence of acute infarct. No hydrocephalus. No extra-axial collection. No mass effect or edema are seen. No midline shift. There is encephalomalacia due to a large chronic right MCA infarct centered in the frontotemporal area but involving portions of the parietal lobe. Ex vacuo prominence of the right lateral ventricle is seen. There is background moderate cerebral atrophy, mild to moderate cerebellar atrophy, small vessel disease of the cerebral white matter. Dystrophic calcifications extend along the falx. No hyperdense vessels. ORBITS: No acute abnormality. SINUSES: No acute abnormality. SOFT TISSUES AND SKULL: No acute soft tissue abnormality. No skull fracture. IMPRESSION: 1. No acute intracranial CT findings. 2. Encephalomalacia from a large remote chronic right MCA territory infarct with ex vacuo prominence of the right lateral ventricle without midline shift. Electronically signed by: Francis Quam MD 09/08/2024 07:41 AM EST RP Workstation: [id number]        Scheduled Meds:  apixaban   5 mg Oral BID   aspirin  EC  81 mg Oral Daily   busPIRone   5 mg Oral BID   carbidopa -levodopa   2 tablet Oral Q supper   carbidopa -levodopa   3 tablet Oral 2 times per day   insulin  aspart  0-15 Units Subcutaneous TID WC   melatonin  6 mg Oral QHS   memantine   20 mg Oral QHS   metFORMIN   500 mg Oral Q breakfast   methenamine  1,000 mg Oral BID   metoprolol  succinate  25 mg Oral Daily   oxybutynin   2.5 mg Oral BID   pantoprazole   40 mg Oral Daily   Pimavanserin  Tartrate  34 mg Oral Daily   rasagiline   1 mg Oral Daily   simvastatin   10 mg Oral QHS   Continuous Infusions:  meropenem  (MERREM ) IV 1 g (09/09/24 0207)     LOS: 1 day    Time spent: 35 Minutes    Robert Eddinger A Marena Witts, MD [facility]   If 7PM-7AM, please contact night-coverage [email or web address]  09/09/2024, 7:42 AM

## 2024-09-09 NOTE — Consult Note (Signed)
 WOC Nurse Consult Note: Reason for Consult: Stage 2 Pressure injury buttocks  Wound type:  Stage 2 Pressure Injuries B medial buttocks; likely combination of pressure, friction and shear  Pressure Injury POA: Yes Measurement: L medial buttock 5 cm x 3 cm x 0.1 cm; R medial buttock 4 cm x 2 cm x 0.1 cm  Wound bed: red moist  Drainage (amount, consistency, odor) minimal serosanguinous  Periwound: intact Dressing procedure/placement/frequency: Cleanse buttocks with soap and water, dry and apply Xeroform gauze ([place] [phone]) to wound bed daily.  Secure with silicone foam.   POC discussed with primary nurse. WOC team will not follow. RE-consult if further needs arise.   Thank you,    Powell Bar MSN, RN-BC, [employer]

## 2024-09-09 NOTE — Evaluation (Signed)
 Occupational Therapy Evaluation Patient Details Name: Robert [name] MRN: [id number] DOB: [birth year] Today's Date: 09/09/2024   History of Present Illness   Pt is a 78y.o. 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 Admitted 11/14 due to AMS and fever.     Clinical Impressions PTA, pt was living at home with his wife and had a pca for 5hours per day. His pca would assist with transfers, ADL, IADL, etc. Pt's wife reports she would get pt out of bed daily. Pt and wife are interested in additional assistance in the evening and resuming therapy services. Pt's wife reports pt having recently decline in independence with grooming and noted increased difficulty with shower transfers. Bed level eval this session due to pt's need for +2 assistance. He required totalA to roll in bed to reposition. RLE strength grossly 3/5, RUE grossly 5/5. Pt with hx of cva and residual left hemiparesis. Recommend HHOT services. Will continue to follow acutely and progress appropriately.     If plan is discharge home, recommend the following:   A lot of help with walking and/or transfers;Two people to help with walking and/or transfers;A lot of help with bathing/dressing/bathroom;Assist for transportation;Assistance with cooking/housework     Functional Status Assessment   Patient has had a recent decline in their functional status and demonstrates the ability to make significant improvements in function in a reasonable and predictable amount of time.     Equipment Recommendations   None recommended by OT (spouse declined hoyer lift recommendation)     Recommendations for Other Services         Precautions/Restrictions   Precautions Precautions: Fall Restrictions Weight Bearing Restrictions Per Provider Order: No     Mobility Bed Mobility               General bed mobility  comments: deferred due to +2 need and therapist safety;pt required totalA to roll toward R to position pillow under his shoulder/side to prevent left lateral lean    Transfers                   General transfer comment: deferred this session;pt requires +2 assist      Balance                                           ADL either performed or assessed with clinical judgement   ADL Overall ADL's : Needs assistance/impaired                                       General ADL Comments: likely close to baseline;pt requires totalA for LB dressing;maxA for UB dressing;setup assistance for self-feeding; ADL different from baseline is grooming, requiring cga instead of setup assistance     Vision         Perception         Praxis         Pertinent Vitals/Pain Pain Assessment Pain Assessment: No/denies pain     Extremity/Trunk Assessment Upper Extremity Assessment Upper Extremity Assessment: RUE deficits/detail;LUE deficits/detail RUE Deficits / Details: grossly 5/5;using functionally to drink from cup RUE Sensation: WNL RUE Coordination: WNL LUE Deficits / Details: hx of hemiparesis, resting position in flexor synergy  pattern, was able to reach full PROM digit extension and elbow extension. shoulder abduction 90* PROM shoulder flexion PROM 170* LUE Coordination: decreased fine motor;decreased gross motor   Lower Extremity Assessment Lower Extremity Assessment: Defer to PT evaluation       Communication Communication Communication: Impaired Factors Affecting Communication: Hearing impaired   Cognition Arousal: Alert Behavior During Therapy: WFL for tasks assessed/performed Cognition: History of cognitive impairments             OT - Cognition Comments: hx of cognitive communication disorder                 Following commands: Intact       Cueing  General Comments   Cueing Techniques: Verbal cues  vss;noted  IV leaking notified RN   Exercises Exercises: Other exercises Other Exercises Other Exercises: AAROM LUE shoulder, elbow, wrist digit flexion/extension x10 each joint Other Exercises: AROM RLE knee flexion/extension x10 Other Exercises: AAROM LLE knee flexion/extension x10   Shoulder Instructions      Home Living Family/patient expects to be discharged to:: Private residence Living Arrangements: Spouse/significant other Available Help at Discharge: Family;Personal care attendant Type of Home: Apartment Home Access: Level entry     Home Layout: One level     Bathroom Shower/Tub: Tub/shower unit;Curtain   Bathroom Toilet: Handicapped height Bathroom Accessibility: Yes How Accessible: Accessible via wheelchair;Accessible via walker Home Equipment: Grab bars - tub/shower;Hand held shower head;Transport chair;Hospital bed   Additional Comments: was receiving OT, PT until about two weeks ago, had SLP services that ended about 2months ago.      Prior Functioning/Environment Prior Level of Function : Needs assist  Cognitive Assist : Mobility (cognitive);ADLs (cognitive) Mobility (Cognitive): Intermittent cues ADLs (Cognitive): Intermittent cues Physical Assist : Mobility (physical);ADLs (physical) Mobility (physical): Bed mobility;Transfers ADLs (physical): Feeding;Grooming;Bathing;Dressing;Toileting;IADLs Mobility Comments: non ambulatory. caregiver and spouse use sit to stand lift (requires +2 person) for all transfers. caregiver assists in the morning, but wife assists at night.  patient gets up to a chair for several hours a day. spouses reports pt has increased stiffness with transfers as well. ADLs Comments: can self feed after setup, requires assistance for all additional ADL;has noted a recent decline in independence with self-care (electric shaver and grabbing onto bars in shower) spouse reports decline in ability and safety with these tasks.    OT Problem List:  Decreased activity tolerance;Decreased range of motion;Impaired balance (sitting and/or standing);Decreased strength;Decreased safety awareness;Impaired UE functional use   OT Treatment/Interventions: Self-care/ADL training;DME and/or AE instruction;Patient/family education;Therapeutic activities;Therapeutic exercise      OT Goals(Current goals can be found in the care plan section)   Acute Rehab OT Goals Patient Stated Goal: pt did not state OT Goal Formulation: With patient Time For Goal Achievement: 09/23/24 Potential to Achieve Goals: Fair ADL Goals Pt Will Perform Grooming: with set-up;bed level Additional ADL Goal #1: Pt will transfer OOB via sit<>stand lift equipment with modA-maxA+2.   OT Frequency:  Min 1X/week    Co-evaluation              AM-PAC OT 6 Clicks Daily Activity     Outcome Measure Help from another person eating meals?: A Little Help from another person taking care of personal grooming?: A Little Help from another person toileting, which includes using toliet, bedpan, or urinal?: Total Help from another person bathing (including washing, rinsing, drying)?: A Lot Help from another person to put on and taking off regular upper body clothing?: A Lot Help from  another person to put on and taking off regular lower body clothing?: Total 6 Click Score: 12   End of Session Nurse Communication: Mobility status;Other (comment) (IV leaking)  Activity Tolerance: Patient tolerated treatment well Patient left: in bed;with call bell/phone within reach;with bed alarm set;with family/visitor present  OT Visit Diagnosis: Other abnormalities of gait and mobility (R26.89);Muscle weakness (generalized) (M62.81)                Time: 8575-8543 OT Time Calculation (min): 32 min Charges:  OT General Charges $OT Visit: 1 Visit OT Evaluation $OT Eval Moderate Complexity: 1 Mod OT Treatments $Therapeutic Exercise: 8-22 mins  Verneita OTR/L Acute Rehabilitation  Services Office: [phone]   Verneita ONEIDA Moose 09/09/2024, 3:17 PM

## 2024-09-10 LAB — BASIC METABOLIC PANEL WITH GFR
Anion gap: 7 (ref 5–15)
BUN: 13 mg/dL (ref 8–23)
CO2: 26 mmol/L (ref 22–32)
Calcium: 8.8 mg/dL — ABNORMAL LOW (ref 8.9–10.3)
Chloride: 104 mmol/L (ref 98–111)
Creatinine, Ser: 0.94 mg/dL (ref 0.61–1.24)
GFR, Estimated: >60 mL/min (ref >60)
Glucose, Bld: 116 mg/dL — ABNORMAL HIGH (ref 70–99)
Potassium: 4.5 mmol/L (ref 3.5–5.1)
Sodium: 137 mmol/L (ref 135–145)

## 2024-09-10 LAB — GLUCOSE, CAPILLARY
Glucose-Capillary: 138 mg/dL — ABNORMAL HIGH (ref 70–99)
Glucose-Capillary: 134 mg/dL — ABNORMAL HIGH (ref 70–99)
Glucose-Capillary: 138 mg/dL — ABNORMAL HIGH (ref 70–99)
Glucose-Capillary: 130 mg/dL — ABNORMAL HIGH (ref 70–99)

## 2024-09-10 MED ORDER — LACTATED RINGERS IV SOLN: INTRAVENOUS | Status: DC

## 2024-09-10 NOTE — Plan of Care (Signed)

## 2024-09-10 NOTE — Progress Notes (Signed)
 PROGRESS NOTE    Robert [name]  FMW:[id number] DOB: 02/09/1946 DOA: 09/08/2024 PCP: [facility]   Brief Narrative: 78 year old with past medical history significant for anemia, BPH, gross hematuria, history of urinary retention, chronic indwelling Foley catheter, carotid artery occlusion, CVA left-sided hemiparesis, left foot drop, diabetes type 2, care history of DVT, hypertension chronic systolic heart failure presents to the ED with altered mental status after waking up with fevers the morning of admission.  He has nonproductive cough.  Evaluation in the ED UA with moderate leukocyte 21-50 red blood cell greater than 50 white blood cell.  RSV coronavirus negative.  Lactic acid 2.2 chest x-ray no acute cardiopulmonary process.  CT head without contrast no acute intracranial findings.  There is encephalomalacia from large remote chronic right MCA territory infarct with's prominence of the right lateral ventricle without midline shift.   Assessment & Plan:   Principal Problem:   Catheter-associated urinary tract infection Active Problems:   History of CVA with residual left hemiparesis   BPH/urethral stricture with suprapubic catheter   DM2 (diabetes mellitus, type 2) (HCC)   Fever   HLD (hyperlipidemia)   History of DVT (deep vein thrombosis)   Parkinson's disease (HCC)   Spasticity as late effect of cerebrovascular accident (CVA)   Acute metabolic encephalopathy   Chronic diastolic heart failure (HCC)   Lactic acidosis   Anxiety   Cognitive communication disorder   Gastroesophageal reflux disease without esophagitis   Neurogenic bladder  Acute metabolic/toxic encephalopathy in the setting of fever and UTI; - Patient present with altered mental status, more confused, has been more sleepy and eating less at home. - In the setting of urinary tract infection. - Continue IV fluids and IV antibiotics - PT OT consulted, for mobilization Seems more  interactive  Catheter associated urinary tract infection BPH ureteral stricture with suprapubic catheter Neurogenic bladder - Presented with low-grade fever, mild leukocytosis, UA with more than 50 white blood cells. - Urine culture growing more than 100 K colonies. E coli.  - Continue meropenem , history of ESBL He is catheter was changed last Thursday Blood cultures: No growth to date.  Will discussed with ID tx options for discharge  Lactic acidosis: - In the setting of infection and starvation. Sepsis ruled out. 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  PT OT speech consulted  Diabetes type 2 - Hold metformin  while inpatient. - Sliding scale insulin   Hyperlipidemia: - Continue simvastatin   History of DVT: - Continue Eliquis   Anxiety - Continue buspirone   Cognitive communication disorder: - Continue memantine    Parkinson's disease - Continue Sinemet , rasagiline  and pimavanserin    Chronic diastolic heart failure - Euvolemic  GERD: - PPI   See wound care documentation below Pressure injury buttock right left medial stage II POA Wound 09/08/24 1510 Pressure Injury Buttocks Right;Left;Medial Stage 2 -  Partial thickness loss of dermis presenting as a shallow open injury with a red, pink wound bed without slough. (Active)       Estimated body mass index is 28.06 kg/m as calculated from the following:   Height as of this encounter: 6' 4 (1.93 m).   Weight as of this encounter: 104.6 kg.   DVT prophylaxis: Eliquis  Code Status: Full code Family Communication: Wife who was at bedside Disposition Plan:  Status is: Inpatient Remains inpatient appropriate because: Management of UTI    Consultants:  None  Procedures:  None  Antimicrobials:    Subjective: He is alert, more interactive.   Objective: Vitals:    09/09/24 1348 09/09/24 2145 09/10/24 0614 09/10/24 1412  BP: 130/62 (!) 163/93 (!) 145/77 122/67  Pulse: 75 81 68 75  Resp: 17 18 18 18   Temp: 99.8 F (37.7 C) 98.8 F (37.1 C) 99.5 F (37.5 C) 99.3 F (37.4 C)  TempSrc: Oral Oral Oral Oral  SpO2: 97% 96% 97% 98%  Weight:      Height:        Intake/Output Summary (Last 24 hours) at 09/10/2024 1459 Last data filed at 09/10/2024 1100 Gross per 24 hour  Intake 710.24 ml  Output 2550 ml  Net -1839.76 ml   Filed Weights   09/08/24 2233  Weight: 104.6 kg    Examination:  General exam: Alert Respiratory system: CTA Cardiovascular system: S 1, S 2 RRR Gastrointestinal system: BS present, soft, nt Central nervous system: alert, chronic left side weakness Extremities: Symmetric 5 x 5 power.    Data Reviewed: I have personally reviewed following labs and imaging studies  CBC: Recent Labs  Lab 09/08/24 0545 09/09/24 0344  WBC 11.5* 8.9  NEUTROABS  --  5.7  HGB 14.7 13.2  HCT 45.2 39.9  MCV 90.8 89.9  PLT 223 214   Basic Metabolic Panel: Recent Labs  Lab 09/08/24 0545 09/09/24 0344 09/10/24 0405  NA 137 141 137  K 4.0 3.9 4.5  CL 102 106 104  CO2 25 25 26   GLUCOSE 176* 113* 116*  BUN 15 14 13   CREATININE 0.98 0.86 0.94  CALCIUM 9.2 8.9 8.8*   GFR: Estimated Creatinine Clearance: 87.4 mL/min (by C-G formula based on SCr of 0.94 mg/dL). Liver Function Tests: Recent Labs  Lab 09/08/24 0545 09/09/24 0344  AST 10* 13*  ALT 13 <5  ALKPHOS 81 59  BILITOT 0.7 0.8  PROT 6.9 6.0*  ALBUMIN 3.7 3.2*   No results for input(s): LIPASE, AMYLASE in the last 168 hours. No results for input(s): AMMONIA in the last 168 hours. Coagulation Profile: Recent Labs  Lab 09/08/24 1148  INR 1.3*   Cardiac Enzymes: No results for input(s): CKTOTAL, CKMB, CKMBINDEX, TROPONINI in the last 168 hours. BNP (last 3 results) No results for input(s): PROBNP in the last 8760 hours. HbA1C: Recent Labs     09/08/24 1148  HGBA1C 6.3*   CBG: Recent Labs  Lab 09/09/24 1118 09/09/24 1616 09/09/24 2143 09/10/24 0737 09/10/24 1147  GLUCAP 189* 128* 152* 138* 134*   Lipid Profile: No results for input(s): CHOL, HDL, LDLCALC, TRIG, CHOLHDL, LDLDIRECT in the last 72 hours. Thyroid  Function Tests: No results for input(s): TSH, T4TOTAL, FREET4, T3FREE, THYROIDAB in the last 72 hours. Anemia Panel: No results for input(s): VITAMINB12, FOLATE, FERRITIN, TIBC, IRON, RETICCTPCT in the last 72 hours. Sepsis Labs: Recent Labs  Lab 09/08/24 0759 09/08/24 0839 09/08/24 1156 09/08/24 1555  LATICACIDVEN 2.1* 2.8* 1.3 2.0*    Recent Results (from the past 240 hours)  Blood culture (routine x 2)     Status: None (Preliminary result)   Collection Time: 09/08/24  5:40 AM   Specimen: BLOOD  Result Value Ref Range Status   Specimen Description   Final    BLOOD BLOOD LEFT HAND Performed at [facility], [address], [place], [place] [address]    Special Requests   Final    BOTTLES DRAWN AEROBIC AND ANAEROBIC Blood Culture adequate volume Performed at [facility], [address], [place], [place] [address]    Culture   Final    NO GROWTH 2 DAYS Performed at [facility], [address] [place], [place] [address]    Report Status PENDING  Incomplete  Resp panel by RT-PCR (RSV, Flu A&B, Covid) Anterior Nasal Swab     Status: None   Collection Time: 09/08/24  8:39 AM   Specimen: Anterior Nasal Swab  Result Value Ref Range Status   SARS Coronavirus 2 by RT PCR NEGATIVE NEGATIVE Final    Comment: (NOTE) SARS-CoV-2 target nucleic acids are NOT DETECTED.  The SARS-CoV-2 RNA is generally detectable in upper respiratory specimens during the acute phase of infection. The lowest concentration of SARS-CoV-2 viral copies this assay can detect is 138 copies/mL. A negative result does not preclude SARS-Cov-2 infection  and should not be used as the sole basis for treatment or other patient management decisions. A negative result may occur with  improper specimen collection/handling, submission of specimen other than nasopharyngeal swab, presence of viral mutation(s) within the areas targeted by this assay, and inadequate number of viral copies(<138 copies/mL). A negative result must be combined with clinical observations, patient history, and epidemiological information. The expected result is Negative.  Fact Sheet for Patients:  bloggercourse.com  Fact Sheet for Healthcare Providers:  seriousbroker.it  This test is no t yet approved or cleared by the [place]  FDA and  has been authorized for detection and/or diagnosis of SARS-CoV-2 by FDA under an Emergency Use Authorization (EUA). This EUA will remain  in effect (meaning this test can be used) for the duration of the COVID-19 declaration under Section 564(b)(1) of the Act, 21 U.S.C.section 360bbb-3(b)(1), unless the authorization is terminated  or revoked sooner.       Influenza A by PCR NEGATIVE NEGATIVE Final   Influenza B by PCR NEGATIVE NEGATIVE Final    Comment: (NOTE) The Xpert Xpress SARS-CoV-2/FLU/RSV plus assay is intended as an aid in the diagnosis of influenza from Nasopharyngeal swab specimens and should not be used as a sole basis for treatment. Nasal washings and aspirates are unacceptable for Xpert Xpress SARS-CoV-2/FLU/RSV testing.  Fact Sheet for Patients: bloggercourse.com  Fact Sheet for Healthcare Providers: seriousbroker.it  This test is not yet approved or cleared by the [place]  FDA and has been authorized for detection and/or diagnosis of SARS-CoV-2 by FDA under an Emergency Use Authorization (EUA). This EUA will remain in effect (meaning this test can be used) for the duration of the COVID-19 declaration  under Section 564(b)(1) of the Act, 21 U.S.C. section 360bbb-3(b)(1), unless the authorization is terminated or revoked.     Resp Syncytial Virus by PCR NEGATIVE NEGATIVE Final    Comment: (NOTE) Fact Sheet for Patients: bloggercourse.com  Fact Sheet for Healthcare Providers: seriousbroker.it  This test is not yet approved or cleared by the [place]  FDA and has been authorized for detection and/or diagnosis of SARS-CoV-2 by FDA under an Emergency Use Authorization (EUA). This EUA will remain in effect (meaning this test can be used) for the duration of the COVID-19 declaration under Section 564(b)(1) of the Act, 21 U.S.C. section 360bbb-3(b)(1), unless the authorization is terminated or revoked.  Performed at [facility], [address], [place], [place] [address]   Urine Culture     Status: Abnormal (Preliminary result)   Collection Time: 09/08/24  1:23 PM   Specimen: Urine, Clean Catch  Result Value Ref Range Status   Specimen Description  Final    URINE, CLEAN CATCH Performed at [facility] [address], [place], [place] [address]    Special Requests   Final    NONE Performed at [facility], [address] [place], [place] [address]    Culture (A)  Final    >=100,000 COLONIES/mL ESCHERICHIA COLI Confirmed Extended Spectrum Beta-Lactamase Producer (ESBL).  In bloodstream infections from ESBL organisms, carbapenems are preferred over piperacillin /tazobactam. They are shown to have a lower risk of mortality. 40,000 COLONIES/mL KLEBSIELLA PNEUMONIAE CONFIRMATION OF SUSCEPTIBILITIES IN PROGRESS Performed at [facility], [address] [place], [place] [address]    Report Status PENDING  Incomplete   Organism ID, Bacteria ESCHERICHIA COLI (A)  Final      Susceptibility   Escherichia coli - MIC*    AMPICILLIN >=32 RESISTANT Resistant     CEFAZOLIN   (URINE) Value in next row Resistant      >=32 RESISTANTThis is a modified FDA-approved test that has been validated and its performance characteristics determined by the reporting laboratory.  This laboratory is certified under the Clinical Laboratory Improvement Amendments CLIA as qualified to perform high complexity clinical laboratory testing.    CEFEPIME  Value in next row Sensitive      >=32 RESISTANTThis is a modified FDA-approved test that has been validated and its performance characteristics determined by the reporting laboratory.  This laboratory is certified under the Clinical Laboratory Improvement Amendments CLIA as qualified to perform high complexity clinical laboratory testing.    ERTAPENEM Value in next row Sensitive      >=32 RESISTANTThis is a modified FDA-approved test that has been validated and its performance characteristics determined by the reporting laboratory.  This laboratory is certified under the Clinical Laboratory Improvement Amendments CLIA as qualified to perform high complexity clinical laboratory testing.    CEFTRIAXONE  Value in next row Resistant      >=32 RESISTANTThis is a modified FDA-approved test that has been validated and its performance characteristics determined by the reporting laboratory.  This laboratory is certified under the Clinical Laboratory Improvement Amendments CLIA as qualified to perform high complexity clinical laboratory testing.    CIPROFLOXACIN  Value in next row Intermediate      >=32 RESISTANTThis is a modified FDA-approved test that has been validated and its performance characteristics determined by the reporting laboratory.  This laboratory is certified under the Clinical Laboratory Improvement Amendments CLIA as qualified to perform high complexity clinical laboratory testing.    GENTAMICIN Value in next row Sensitive      >=32 RESISTANTThis is a modified FDA-approved test that has been validated and its performance characteristics  determined by the reporting laboratory.  This laboratory is certified under the Clinical Laboratory Improvement Amendments CLIA as qualified to perform high complexity clinical laboratory testing.    NITROFURANTOIN Value in next row Sensitive      >=32 RESISTANTThis is a modified FDA-approved test that has been validated and its performance characteristics determined by the reporting laboratory.  This laboratory is certified under the Clinical Laboratory Improvement Amendments CLIA as qualified to perform high complexity clinical laboratory testing.    TRIMETH /SULFA  Value in next row Sensitive      >=32 RESISTANTThis is a modified FDA-approved test that has been validated and its performance characteristics determined by the reporting laboratory.  This laboratory is certified under the Clinical Laboratory Improvement Amendments CLIA as qualified to perform high complexity clinical laboratory testing.    AMPICILLIN/SULBACTAM Value in next row Sensitive      >=  32 RESISTANTThis is a modified FDA-approved test that has been validated and its performance characteristics determined by the reporting laboratory.  This laboratory is certified under the Clinical Laboratory Improvement Amendments CLIA as qualified to perform high complexity clinical laboratory testing.    PIP/TAZO Value in next row Sensitive      <=4 SENSITIVEThis is a modified FDA-approved test that has been validated and its performance characteristics determined by the reporting laboratory.  This laboratory is certified under the Clinical Laboratory Improvement Amendments CLIA as qualified to perform high complexity clinical laboratory testing.    MEROPENEM  Value in next row Sensitive      <=4 SENSITIVEThis is a modified FDA-approved test that has been validated and its performance characteristics determined by the reporting laboratory.  This laboratory is certified under the Clinical Laboratory Improvement Amendments CLIA as qualified to perform  high complexity clinical laboratory testing.    * >=100,000 COLONIES/mL ESCHERICHIA COLI  Blood culture (routine x 2)     Status: None (Preliminary result)   Collection Time: 09/08/24  3:55 PM   Specimen: BLOOD RIGHT ARM  Result Value Ref Range Status   Specimen Description   Final    BLOOD RIGHT ARM Performed at [facility], [address], [place], [place] [address]    Special Requests   Final    BOTTLES DRAWN AEROBIC AND ANAEROBIC Blood Culture adequate volume Performed at [facility], [address], [place], [place] [address]    Culture   Final    NO GROWTH 2 DAYS Performed at [facility], [address] [place], [place] [address]    Report Status PENDING  Incomplete         Radiology Studies: No results found.       Scheduled Meds:  apixaban   5 mg Oral BID   aspirin  EC  81 mg Oral Daily   busPIRone   5 mg Oral BID   carbidopa -levodopa   2 tablet Oral Q supper   carbidopa -levodopa   3 tablet Oral 2 times per day   feeding supplement (GLUCERNA SHAKE)  237 mL Oral TID BM   insulin  aspart  0-15 Units Subcutaneous TID WC   melatonin  6 mg Oral QHS   memantine   20 mg Oral QHS   methenamine  1,000 mg Oral BID   oxybutynin   2.5 mg Oral BID   pantoprazole   40 mg Oral Daily   Pimavanserin  Tartrate  34 mg Oral Daily   rasagiline   1 mg Oral Daily   simvastatin   10 mg Oral QHS   Continuous Infusions:  meropenem  (MERREM ) IV 1 g (09/10/24 0900)     LOS: 2 days    Time spent: 35 Minutes    Willella Harding A Neesa Knapik, MD [facility]   If 7PM-7AM, please contact night-coverage [email or web address]  09/10/2024, 2:59 PM

## 2024-09-10 NOTE — Evaluation (Signed)
 Physical Therapy Evaluation Patient Details Name: Robert [name] MRN: [id number] DOB: [birth year] Today's Date: 09/10/2024  History of Present Illness  Pt is a 78y.o. 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 Admitted 11/14 due to AMS and fever. Dx: UTI  Clinical Impression  On eval, pt required Total A +2 for mobility. He was able to sit EOB (eventually with CGA)-worked on static and dynamic sitting balance, activating core/trunk musculature. Pt performed R LE exercises while sitting EOB. Pt tolerated activity well. Family was present during session. Discussed d/c plan-plan is for return home. Family would like another trial of HHPT after this hospital admission.         If plan is discharge home, recommend the following: Two people to help with walking and/or transfers;Two people to help with bathing/dressing/bathroom;Assist for transportation;Help with stairs or ramp for entrance;Direct supervision/assist for medications management   Can travel by private vehicle        Equipment Recommendations None recommended by PT  Recommendations for Other Services       Functional Status Assessment Patient has had a recent decline in their functional status and/or demonstrates limited ability to make significant improvements in function in a reasonable and predictable amount of time     Precautions / Restrictions Precautions Precautions: Fall Precaution/Restrictions Comments: L hemiplegia Restrictions Weight Bearing Restrictions Per Provider Order: No      Mobility  Bed Mobility Overal bed mobility: Needs Assistance Bed Mobility: Supine to Sit, Sit to Supine     Supine to sit: Total assist, +2 for physical assistance, +2 for safety/equipment, HOB elevated Sit to supine: Total assist, +2 for physical assistance, +2 for safety/equipment   General bed mobility comments: +2  for bed mobility. Pt able to move  LE and assist some with trunk to upright. With time, able to sit unsupported with CGA. Worked on static and dynamice sitting balance, reaching, lateral elbow propping. Worked on activating core/trunk musculature.    Transfers                   General transfer comment: deferred    Ambulation/Gait                  Stairs            Wheelchair Mobility     Tilt Bed    Modified Rankin (Stroke Patients Only)       Balance Overall balance assessment: Needs assistance Sitting-balance support: Feet supported   Sitting balance - Comments: Unsupported sitting with and without UE support; With cues and assist (Min-Mod intermittently to correct posture, pt able to right trunk to midline. Postural control: Left lateral lean                                   Pertinent Vitals/Pain Pain Assessment Pain Assessment: No/denies pain    Home Living Family/patient expects to be discharged to:: Private residence Living Arrangements: Spouse/significant other Available Help at Discharge: Family;Personal care attendant Type of Home: Apartment Home Access: Level entry       Home Layout: One level Home Equipment: Grab bars - tub/shower;Hand held shower head;Transport chair;Hospital bed Additional Comments: was receiving OT, PT until about two weeks ago, had SLP services that ended about 2months ago.    Prior Function Prior Level of Function : Needs assist  Mobility Comments: non ambulatory. caregiver and spouse use sit to stand lift (requires +2 person) for all transfers. caregiver assists in the morning, but wife assists at night.  patient gets up to a chair for several hours a day. ADLs Comments: can self feed after setup, requires assistance for all additional ADL;has noted a recent decline in independence with self-care (electric shaver and grabbing onto bars in shower) spouse reports decline in ability  and safety with these tasks.     Extremity/Trunk Assessment   Upper Extremity Assessment Upper Extremity Assessment: Defer to OT evaluation    Lower Extremity Assessment Lower Extremity Assessment: LLE deficits/detail LLE Deficits / Details: Hip flex 2-/5 LLE Sensation: decreased proprioception LLE Coordination: decreased gross motor;decreased fine motor    Cervical / Trunk Assessment Cervical / Trunk Assessment: Kyphotic  Communication   Communication Communication: Impaired Factors Affecting Communication: Hearing impaired    Cognition Arousal: Alert Behavior During Therapy: WFL for tasks assessed/performed                             Following commands: Intact       Cueing Cueing Techniques: Verbal cues     General Comments      Exercises General Exercises - Lower Extremity Long Arc Quad: AROM, Right, 15 reps, Seated Heel Slides: AAROM, Left, 10 reps, Supine   Assessment/Plan    PT Assessment Patient needs continued PT services  PT Problem List Decreased strength;Decreased range of motion;Decreased activity tolerance;Decreased balance;Decreased mobility;Decreased coordination       PT Treatment Interventions Functional mobility training;Therapeutic activities;Therapeutic exercise;Patient/family education;Balance training    PT Goals (Current goals can be found in the Care Plan section)  Acute Rehab PT Goals Patient Stated Goal: per family, home with HHPT f/u again PT Goal Formulation: With patient/family Time For Goal Achievement: 09/24/24 Potential to Achieve Goals: Fair    Frequency Min 2X/week     Co-evaluation               AM-PAC PT 6 Clicks Mobility  Outcome Measure Help needed turning from your back to your side while in a flat bed without using bedrails?: Total Help needed moving from lying on your back to sitting on the side of a flat bed without using bedrails?: Total Help needed moving to and from a bed to a chair  (including a wheelchair)?: Total Help needed standing up from a chair using your arms (e.g., wheelchair or bedside chair)?: Total Help needed to walk in hospital room?: Total Help needed climbing 3-5 steps with a railing? : Total 6 Click Score: 6    End of Session   Activity Tolerance: Patient tolerated treatment well Patient left: in bed;with call bell/phone within reach;with bed alarm set;with family/visitor present   PT Visit Diagnosis: Muscle weakness (generalized) (M62.81);Other abnormalities of gait and mobility (R26.89);Other symptoms and signs involving the nervous system (R29.898);Hemiplegia and hemiparesis Hemiplegia - Right/Left: Left Hemiplegia - caused by: Cerebral infarction    Time: 8582-8553 PT Time Calculation (min) (ACUTE ONLY): 29 min   Charges:   PT Evaluation $PT Eval Low Complexity: 1 Low PT Treatments $Therapeutic Activity: 8-22 mins PT General Charges $$ ACUTE PT VISIT: 1 Visit          Dannial SQUIBB, PT Acute Rehabilitation  Office: [phone]

## 2024-09-11 ENCOUNTER — Other Ambulatory Visit (HOSPITAL_COMMUNITY): Payer: Self-pay

## 2024-09-11 DIAGNOSIS — T83510A Infection and inflammatory reaction due to cystostomy catheter, initial encounter: Secondary | ICD-10-CM

## 2024-09-11 LAB — URINE CULTURE: Culture: >=100000 — AB

## 2024-09-11 LAB — GLUCOSE, CAPILLARY
Glucose-Capillary: 158 mg/dL — ABNORMAL HIGH (ref 70–99)
Glucose-Capillary: 149 mg/dL — ABNORMAL HIGH (ref 70–99)

## 2024-09-11 MED ORDER — GLUCERNA SHAKE PO LIQD: 237.0000 mL | Freq: Three times a day (TID) | ORAL | 0 refills | Status: AC

## 2024-09-11 MED ORDER — CHLORHEXIDINE GLUCONATE CLOTH 2 % EX PADS
6.0000 | MEDICATED_PAD | Freq: Every day | CUTANEOUS | Status: DC
  Administered 2024-09-11: 6 via TOPICAL

## 2024-09-11 MED ORDER — GLUCERNA SHAKE PO LIQD: 237.0000 mL | Freq: Three times a day (TID) | ORAL | 0 refills | Status: DC

## 2024-09-11 MED ORDER — SACCHAROMYCES BOULARDII 250 MG PO CAPS
250.0000 mg | ORAL_CAPSULE | Freq: Two times a day (BID) | ORAL | 0 refills | Status: AC
  Filled 2024-09-11: qty 20, 10d supply, fill #0

## 2024-09-11 MED ORDER — SACCHAROMYCES BOULARDII 250 MG PO CAPS
250.0000 mg | ORAL_CAPSULE | Freq: Two times a day (BID) | ORAL | Status: DC
  Administered 2024-09-11: 250 mg via ORAL
  Filled 2024-09-11: qty 1

## 2024-09-11 MED ORDER — SULFAMETHOXAZOLE-TRIMETHOPRIM 800-160 MG PO TABS
1.0000 | ORAL_TABLET | Freq: Two times a day (BID) | ORAL | 0 refills | Status: AC
  Filled 2024-09-11: qty 10, 5d supply, fill #0

## 2024-09-11 NOTE — Progress Notes (Signed)
 Discharge instructions given to family at bedside, discharge medications delivered to patient in a seucre bag. Medications stored in pharmacy returned to family at bedside.

## 2024-09-11 NOTE — Discharge Summary (Signed)
 Physician Discharge Summary   Patient: Robert [name] MRN: [id number] DOB: [birth year]  Admit date:     09/08/2024  Discharge date: 09/11/24  Discharge Physician: Owen DELENA Lore   PCP: [facility]   Recommendations at discharge:  -He needs Prevalon Boot bilateral to be use when in bed.  -He needs wound care; decubitus: Cleanse buttocks with soap and water, dry and apply Xeroform gauze Soila [phone]) to wound bed daily.  Secure with silicone foam. -Follow up with Urology for further care of supra-pubic catheter.    Discharge Diagnoses: Principal Problem:   Catheter-associated urinary tract infection Active Problems:   History of CVA with residual left hemiparesis   BPH/urethral stricture with suprapubic catheter   DM2 (diabetes mellitus, type 2) (HCC)   Fever   HLD (hyperlipidemia)   History of DVT (deep vein thrombosis)   Parkinson's disease (HCC)   Spasticity as late effect of cerebrovascular accident (CVA)   Acute metabolic encephalopathy   Chronic diastolic heart failure (HCC)   Lactic acidosis   Anxiety   Cognitive communication disorder   Gastroesophageal reflux disease without esophagitis   Neurogenic bladder  Resolved Problems:   * No resolved hospital problems. *  Hospital Course: 78 year old with past medical history significant for anemia, BPH, gross hematuria, history of urinary retention, chronic indwelling Foley catheter, carotid artery occlusion, CVA left-sided hemiparesis, left foot drop, diabetes type 2, care history of DVT, hypertension chronic systolic heart failure presents to the ED with altered mental status after waking up with fevers the morning of admission.  He has nonproductive cough.   Evaluation in the ED UA with moderate leukocyte 21-50 red blood cell greater than 50 white blood cell.  RSV coronavirus negative.  Lactic acid 2.2 chest x-ray no acute cardiopulmonary process.  CT head without contrast no acute intracranial findings.   There is encephalomalacia from large remote chronic right MCA territory infarct with's prominence of the right lateral ventricle without midline shift.      Assessment and Plan: Acute metabolic/toxic encephalopathy in the setting of fever and UTI; - Patient present with altered mental status, more confused, has been more sleepy and eating less at home. - In the setting of urinary tract infection. - Continue IV fluids and IV antibiotics - PT OT consulted, for mobilization Resolved.    Catheter associated urinary tract infection BPH ureteral stricture with suprapubic catheter Neurogenic bladder - Presented with low-grade fever, mild leukocytosis, UA with more than 50 white blood cells. - Urine culture growing more than 100 K colonies. E coli. And low colonc Klebsiella.  - Continue meropenem , history of ESBL He is catheter was changed last Thursday Blood cultures: No growth to date.  Discussed with pharmacist plan to discharge on Bactrim  for 5 days.    Lactic acidosis: - In the setting of infection and starvation. Sepsis ruled out. 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  PT OT speech consulted   Diabetes type 2 - Hold metformin  while inpatient. - Sliding scale insulin  Started Glucerna.    Hyperlipidemia: - Continue simvastatin    History of DVT: - Continue Eliquis    Anxiety - Continue buspirone    Cognitive communication disorder: - Continue memantine      Parkinson's disease - Continue Sinemet , rasagiline  and pimavanserin      Chronic diastolic heart failure - Euvolemic   GERD: - PPI  See wound care documentation below Pressure injury buttock right left medial stage II POA Wound 09/08/24 1510 Pressure Injury Buttocks Right;Left;Medial Stage 2 -  Partial thickness loss of dermis presenting as a shallow open injury with  a red, pink wound bed without slough. (Active)           Consultants: None Procedures performed: none Disposition: Home Diet recommendation:  Discharge Diet Orders (From admission, onward)     Start     Ordered   09/11/24 0000  Diet Carb Modified        09/11/24 1001           Dysphagia type 2   Liquid DISCHARGE MEDICATION: Allergies as of 09/11/2024       Reactions   Fluconazole Dermatitis, Rash   Propofol  Other (See Comments)   Hiccups for weeks   Lisinopril Cough   Fluoxetine Nausea Only, Other (See Comments)   Hallucinations, Feeling nervous, Nausea, Tachycardia also   Nystatin Rash   Sulfamethoxazole -trimethoprim  Diarrhea        Medication List     TAKE these medications    acetaminophen  500 MG tablet Commonly known as: TYLENOL  Take 1,000 mg by mouth every 6 (six) hours as needed for mild pain (pain score 1-3), moderate pain (pain score 4-6) or fever.   apixaban  5 MG Tabs tablet Commonly known as: ELIQUIS  Take 1 tablet (5 mg total) by mouth 2 (two) times daily.   ascorbic acid 250 MG tablet Commonly known as: VITAMIN C Take 250 mg by mouth in the morning and at bedtime.   aspirin  EC 81 MG tablet Take 81 mg by mouth daily. Swallow whole.   bacitracin 500 UNIT/GM ointment Apply 1 Application topically daily as needed for wound care (near catheter).   busPIRone  10 MG tablet Commonly known as: BUSPAR  Take 5-10 mg by mouth See admin instructions. Take 5 mg by mouth in the morning and 5 mg bedtime   Cholecalciferol 50 MCG (2000 UT) Tabs Take 2,000 Units by mouth daily.   Dupilumab 300 MG/2ML Soaj Inject 300 mg into the skin every 14 (fourteen) days.   feeding supplement (GLUCERNA SHAKE) Liqd Take 237 mLs by mouth 3 (three) times daily between meals.   HYDROCORTISONE  ACE (RECTAL) 30 MG Supp Place 30 mg rectally at bedtime.   melatonin 3 MG Tabs tablet Take 6 mg by mouth at bedtime.   memantine  10 MG tablet Commonly known as:  NAMENDA  Take 20 mg by mouth at bedtime.   metFORMIN  500 MG tablet Commonly known as: GLUCOPHAGE  Take 500 mg by mouth at bedtime.   methenamine 1 g tablet Commonly known as: MANDELAMINE Take 1,000 mg by mouth See admin instructions. Crush 1,000 mg and place into applesauce- take morning and bedtime   miconazole 2 % powder Commonly known as: MICOTIN Apply 1 Application topically See admin instructions. Apply in the groin area once a day as needed   Nuplazid  34 MG Caps Generic drug: Pimavanserin  Tartrate Take 34 mg by mouth daily.   nystatin-triamcinolone ointment Commonly known as: MYCOLOG Apply 1 application  topically as needed (rash/yeast).   omeprazole 20 MG capsule Commonly known as: PRILOSEC Take 20 mg by mouth daily before breakfast.   oxybutynin  5 MG tablet Commonly known as: DITROPAN  Take 2.5 mg by mouth in the morning and at bedtime.   psyllium 58.6 % powder Commonly known as: METAMUCIL Take 1 packet by mouth See admin instructions. Mix 2 teaspoonsful of powder into 16 ounces of water and drink  by mouth once a day   rasagiline  1 MG Tabs tablet Commonly known as: AZILECT  Take 1 mg by mouth daily.   saccharomyces boulardii 250 MG capsule Commonly known as: FLORASTOR Take 1 capsule (250 mg total) by mouth 2 (two) times daily.   senna-docusate 8.6-50 MG tablet Commonly known as: Senokot-S Take 2 tablets by mouth daily as needed for mild constipation or moderate constipation.   simvastatin  20 MG tablet Commonly known as: ZOCOR  Take 10 mg by mouth at bedtime.   Sinemet  25-100 MG tablet Generic drug: carbidopa -levodopa  Take 2-3 tablets by mouth See admin instructions. Take 3 tablets by mouth at 7 AM & 12 Noon and 2 tablets between 5-6 PM/after the evening meal   sulfamethoxazole -trimethoprim  800-160 MG tablet Commonly known as: Bactrim  DS Take 1 tablet by mouth 2 (two) times daily for 5 days.   tacrolimus 0.1 % ointment Commonly known as: PROTOPIC Apply 1  application  topically See admin instructions. Apply to groin, armpits, and bottom 2 times a day when skin is broken out   triamcinolone cream 0.1 % Commonly known as: KENALOG Apply 1 Application topically daily as needed (rash).   zinc  oxide 20 % ointment Apply 1 Application topically See admin instructions. Every time there is a diaper change What changed: additional instructions        Follow-up Information     [facility] Follow up in 1 week(s).   Contact information: [address] [facility] [place] [address] [phone]                Discharge Exam: Fredricka Weights   09/08/24 2233  Weight: 104.6 kg   General; NAD Chronic left side weakness  Condition at discharge: stable  The results of significant diagnostics from this hospitalization (including imaging, microbiology, ancillary and laboratory) are listed below for reference.   Imaging Studies: DG Chest 2 View Result Date: 09/08/2024 EXAM: 2 VIEW(S) XRAY OF THE CHEST 09/08/2024 08:32:13 AM COMPARISON: 04/10/2024 CLINICAL HISTORY: fever FINDINGS: LUNGS AND PLEURA: No focal pulmonary opacity. No pleural effusion. No pneumothorax. HEART AND MEDIASTINUM: Stable cardiomediastinal silhouette. BONES AND SOFT TISSUES: Degenerative changes are seen involving both glenohumeral joints. No acute osseous abnormality. IMPRESSION: 1. No acute cardiopulmonary process. Electronically signed by: Lynwood Seip MD 09/08/2024 08:36 AM EST RP Workstation: [id number]   CT Head Wo Contrast Result Date: 09/08/2024 EXAM: CT HEAD WITHOUT CONTRAST 09/08/2024 07:30:00 AM TECHNIQUE: CT of the head was performed without the administration of intravenous contrast. Automated exposure control, iterative reconstruction, and/or weight based adjustment of the mA/kV was utilized to reduce the radiation dose to as low as reasonably achievable. COMPARISON: Head CT 03/31/2024 CLINICAL HISTORY: Mental status change, unknown cause  FINDINGS: BRAIN AND VENTRICLES: No acute hemorrhage. No evidence of acute infarct. No hydrocephalus. No extra-axial collection. No mass effect or edema are seen. No midline shift. There is encephalomalacia due to a large chronic right MCA infarct centered in the frontotemporal area but involving portions of the parietal lobe. Ex vacuo prominence of the right lateral ventricle is seen. There is background moderate cerebral atrophy, mild to moderate cerebellar atrophy, small vessel disease of the cerebral white matter. Dystrophic calcifications extend along the falx. No hyperdense vessels. ORBITS: No acute abnormality. SINUSES: No acute abnormality. SOFT TISSUES AND SKULL: No acute soft tissue abnormality. No skull fracture. IMPRESSION: 1. No acute intracranial CT findings. 2. Encephalomalacia from a large remote chronic right MCA territory infarct with ex vacuo prominence of the right lateral ventricle without midline  shift. Electronically signed by: Francis Quam MD 09/08/2024 07:41 AM EST RP Workstation: [id number]    Microbiology: Results for orders placed or performed during the hospital encounter of 09/08/24  Blood culture (routine x 2)     Status: None (Preliminary result)   Collection Time: 09/08/24  5:40 AM   Specimen: BLOOD  Result Value Ref Range Status   Specimen Description   Final    BLOOD BLOOD LEFT HAND Performed at [facility], [address], [place], [place] [address]    Special Requests   Final    BOTTLES DRAWN AEROBIC AND ANAEROBIC Blood Culture adequate volume Performed at [facility], [address], [place], [place] [address]    Culture   Final    NO GROWTH 3 DAYS Performed at [facility], [address] [place], [place] [address]    Report Status PENDING  Incomplete  Resp panel by RT-PCR (RSV, Flu A&B, Covid) Anterior Nasal Swab     Status: None   Collection Time: 09/08/24  8:39 AM   Specimen: Anterior Nasal Swab  Result  Value Ref Range Status   SARS Coronavirus 2 by RT PCR NEGATIVE NEGATIVE Final    Comment: (NOTE) SARS-CoV-2 target nucleic acids are NOT DETECTED.  The SARS-CoV-2 RNA is generally detectable in upper respiratory specimens during the acute phase of infection. The lowest concentration of SARS-CoV-2 viral copies this assay can detect is 138 copies/mL. A negative result does not preclude SARS-Cov-2 infection and should not be used as the sole basis for treatment or other patient management decisions. A negative result may occur with  improper specimen collection/handling, submission of specimen other than nasopharyngeal swab, presence of viral mutation(s) within the areas targeted by this assay, and inadequate number of viral copies(<138 copies/mL). A negative result must be combined with clinical observations, patient history, and epidemiological information. The expected result is Negative.  Fact Sheet for Patients:  bloggercourse.com  Fact Sheet for Healthcare Providers:  seriousbroker.it  This test is no t yet approved or cleared by the [place]  FDA and  has been authorized for detection and/or diagnosis of SARS-CoV-2 by FDA under an Emergency Use Authorization (EUA). This EUA will remain  in effect (meaning this test can be used) for the duration of the COVID-19 declaration under Section 564(b)(1) of the Act, 21 U.S.C.section 360bbb-3(b)(1), unless the authorization is terminated  or revoked sooner.       Influenza A by PCR NEGATIVE NEGATIVE Final   Influenza B by PCR NEGATIVE NEGATIVE Final    Comment: (NOTE) The Xpert Xpress SARS-CoV-2/FLU/RSV plus assay is intended as an aid in the diagnosis of influenza from Nasopharyngeal swab specimens and should not be used as a sole basis for treatment. Nasal washings and aspirates are unacceptable for Xpert Xpress SARS-CoV-2/FLU/RSV testing.  Fact Sheet for  Patients: bloggercourse.com  Fact Sheet for Healthcare Providers: seriousbroker.it  This test is not yet approved or cleared by the [place]  FDA and has been authorized for detection and/or diagnosis of SARS-CoV-2 by FDA under an Emergency Use Authorization (EUA). This EUA will remain in effect (meaning this test can be used) for the duration of the COVID-19 declaration under Section 564(b)(1) of the Act, 21 U.S.C. section 360bbb-3(b)(1), unless the authorization is terminated or revoked.     Resp Syncytial Virus by PCR NEGATIVE NEGATIVE Final    Comment: (NOTE) Fact Sheet for Patients: bloggercourse.com  Fact Sheet for Healthcare Providers: seriousbroker.it  This test is not  yet approved or cleared by the [place]  FDA and has been authorized for detection and/or diagnosis of SARS-CoV-2 by FDA under an Emergency Use Authorization (EUA). This EUA will remain in effect (meaning this test can be used) for the duration of the COVID-19 declaration under Section 564(b)(1) of the Act, 21 U.S.C. section 360bbb-3(b)(1), unless the authorization is terminated or revoked.  Performed at [facility], [address] [place], [place] [address]   Urine Culture     Status: Abnormal   Collection Time: 09/08/24  1:23 PM   Specimen: Urine, Clean Catch  Result Value Ref Range Status   Specimen Description   Final    URINE, CLEAN CATCH Performed at [facility], [address], [place], [place] [address]    Special Requests   Final    NONE Performed at [facility], [address] [place], [place] [address]    Culture (A)  Final    >=100,000 COLONIES/mL ESCHERICHIA COLI Confirmed Extended Spectrum Beta-Lactamase Producer (ESBL).  In bloodstream infections from ESBL organisms, carbapenems are preferred over  piperacillin /tazobactam. They are shown to have a lower risk of mortality. 40,000 COLONIES/mL KLEBSIELLA PNEUMONIAE CALL MICROBIOLOGY LAB IF SENSITIVITIES ARE REQUIRED. Performed at [facility], [address], [place], [place] [address]    Report Status 09/11/2024 FINAL  Final   Organism ID, Bacteria ESCHERICHIA COLI (A)  Final      Susceptibility   Escherichia coli - MIC*    AMPICILLIN >=32 RESISTANT Resistant     CEFAZOLIN  (URINE) Value in next row Resistant      >=32 RESISTANTThis is a modified FDA-approved test that has been validated and its performance characteristics determined by the reporting laboratory.  This laboratory is certified under the Clinical Laboratory Improvement Amendments CLIA as qualified to perform high complexity clinical laboratory testing.    CEFEPIME  Value in next row Sensitive      >=32 RESISTANTThis is a modified FDA-approved test that has been validated and its performance characteristics determined by the reporting laboratory.  This laboratory is certified under the Clinical Laboratory Improvement Amendments CLIA as qualified to perform high complexity clinical laboratory testing.    ERTAPENEM Value in next row Sensitive      >=32 RESISTANTThis is a modified FDA-approved test that has been validated and its performance characteristics determined by the reporting laboratory.  This laboratory is certified under the Clinical Laboratory Improvement Amendments CLIA as qualified to perform high complexity clinical laboratory testing.    CEFTRIAXONE  Value in next row Resistant      >=32 RESISTANTThis is a modified FDA-approved test that has been validated and its performance characteristics determined by the reporting laboratory.  This laboratory is certified under the Clinical Laboratory Improvement Amendments CLIA as qualified to perform high complexity clinical laboratory testing.    CIPROFLOXACIN  Value in next row Intermediate      >=32 RESISTANTThis is a  modified FDA-approved test that has been validated and its performance characteristics determined by the reporting laboratory.  This laboratory is certified under the Clinical Laboratory Improvement Amendments CLIA as qualified to perform high complexity clinical laboratory testing.    GENTAMICIN Value in next row Sensitive      >=32 RESISTANTThis is a modified FDA-approved test that has been validated and its performance characteristics determined by the reporting laboratory.  This laboratory is certified under the Clinical Laboratory Improvement Amendments CLIA as qualified to perform high complexity clinical laboratory testing.    NITROFURANTOIN  Value in next row Sensitive      >=32 RESISTANTThis is a modified FDA-approved test that has been validated and its performance characteristics determined by the reporting laboratory.  This laboratory is certified under the Clinical Laboratory Improvement Amendments CLIA as qualified to perform high complexity clinical laboratory testing.    TRIMETH /SULFA  Value in next row Sensitive      >=32 RESISTANTThis is a modified FDA-approved test that has been validated and its performance characteristics determined by the reporting laboratory.  This laboratory is certified under the Clinical Laboratory Improvement Amendments CLIA as qualified to perform high complexity clinical laboratory testing.    AMPICILLIN/SULBACTAM Value in next row Sensitive      >=32 RESISTANTThis is a modified FDA-approved test that has been validated and its performance characteristics determined by the reporting laboratory.  This laboratory is certified under the Clinical Laboratory Improvement Amendments CLIA as qualified to perform high complexity clinical laboratory testing.    PIP/TAZO Value in next row Sensitive      <=4 SENSITIVEThis is a modified FDA-approved test that has been validated and its performance characteristics determined by the reporting laboratory.  This laboratory is  certified under the Clinical Laboratory Improvement Amendments CLIA as qualified to perform high complexity clinical laboratory testing.    MEROPENEM  Value in next row Sensitive      <=4 SENSITIVEThis is a modified FDA-approved test that has been validated and its performance characteristics determined by the reporting laboratory.  This laboratory is certified under the Clinical Laboratory Improvement Amendments CLIA as qualified to perform high complexity clinical laboratory testing.    * >=100,000 COLONIES/mL ESCHERICHIA COLI  Blood culture (routine x 2)     Status: None (Preliminary result)   Collection Time: 09/08/24  3:55 PM   Specimen: BLOOD RIGHT ARM  Result Value Ref Range Status   Specimen Description   Final    BLOOD RIGHT ARM Performed at [facility], [address], [place], [place] [address]    Special Requests   Final    BOTTLES DRAWN AEROBIC AND ANAEROBIC Blood Culture adequate volume Performed at [facility], [address] [place], [place] [address]    Culture   Final    NO GROWTH 3 DAYS Performed at [facility], [address] [place], [place] [address]    Report Status PENDING  Incomplete    Labs: CBC: Recent Labs  Lab 09/08/24 0545 09/09/24 0344  WBC 11.5* 8.9  NEUTROABS  --  5.7  HGB 14.7 13.2  HCT 45.2 39.9  MCV 90.8 89.9  PLT 223 214   Basic Metabolic Panel: Recent Labs  Lab 09/08/24 0545 09/09/24 0344 09/10/24 0405  NA 137 141 137  K 4.0 3.9 4.5  CL 102 106 104  CO2 25 25 26   GLUCOSE 176* 113* 116*  BUN 15 14 13   CREATININE 0.98 0.86 0.94  CALCIUM 9.2 8.9 8.8*   Liver Function Tests: Recent Labs  Lab 09/08/24 0545 09/09/24 0344  AST 10* 13*  ALT 13 <5  ALKPHOS 81 59  BILITOT 0.7 0.8  PROT 6.9 6.0*  ALBUMIN 3.7 3.2*   CBG: Recent Labs  Lab 09/10/24 0737 09/10/24 1147 09/10/24 1626 09/10/24 2205 09/11/24 0730  GLUCAP 138* 134* 138* 130* 158*    Discharge time spent: greater than 30  minutes.  Signed: Owen DELENA Lore, MD [facility] 09/11/2024

## 2024-09-11 NOTE — TOC Transition Note (Signed)
 Transition of Care [facility]) - Discharge Note  Patient Details  Name: Robert [name] MRN: [id number] Date of Birth: 1946/05/17  [facility] CM/SW Contact:  Duwaine GORMAN Aran, LCSW Phone Number: 09/11/2024, 12:46 PM  Clinical Narrative: Patient will discharge home today. Wife confirmed patient is getting HHPT/OT through [facility] and speech through [facility]. HH orders placed. CSW notified Artavia with [facility] and Jon with [facility] of discharge. PTAR scheduled. Care management signing off.  Final next level of care: Home w Home Health Services Barriers to Discharge: Barriers Resolved  Patient Goals and CMS Choice Patient states their goals for this hospitalization and ongoing recovery are:: Resume [facility] CMS Medicare.gov Compare Post Acute Care list provided to:: Patient Represenative (must comment) Choice offered to / list presented to : Spouse  Discharge Placement Patient to be transferred to facility by: PTAR Patient and family notified of of transfer: 09/11/24  Discharge Plan and Services Additional resources added to the After Visit Summary for      DME Arranged: N/A DME Agency: NA HH Arranged: PT, OT, Speech Therapy HH Agency: [facility] ([facility]), Other - See comment Damita) Date HH Agency Contacted: 09/11/24 Representative spoke with at [facility] Agency: Jon ([facility]), Baker ([facility])  Social Drivers of Health (SDOH) Interventions SDOH Screenings   Food Insecurity: No Food Insecurity (09/08/2024)  Housing: Low Risk  (09/08/2024)  Transportation Needs: No Transportation Needs (09/08/2024)  Utilities: At Risk (09/08/2024)  Depression (PHQ2-9): Low Risk  (01/22/2022)  Financial Resource Strain: Medium Risk (02/20/2020)  Social Connections: Socially Isolated (09/08/2024)  Tobacco Use: Medium Risk (09/08/2024)   Readmission Risk Interventions    09/11/2024    9:41 AM 04/14/2024   12:41 PM 04/14/2024   12:40 PM  Readmission Risk Prevention Plan   Transportation Screening Complete Complete Complete  PCP or Specialist Appt within 3-5 Days  Complete Not Complete  Not Complete comments  urology follow up in 2 weeks   HRI or Home Care Consult  Complete   Social Work Consult for Recovery Care Planning/Counseling  Complete   Palliative Care Screening  Not Applicable   Medication Review Oceanographer) Complete Complete   HRI or Home Care Consult Complete    SW Recovery Care/Counseling Consult Complete    Palliative Care Screening Not Applicable    Skilled Nursing Facility Not Applicable

## 2024-09-13 LAB — CULTURE, BLOOD (ROUTINE X 2)
Culture: NO GROWTH
Culture: NO GROWTH
Special Requests: ADEQUATE
Special Requests: ADEQUATE

## 2024-10-17 ENCOUNTER — Other Ambulatory Visit (HOSPITAL_COMMUNITY): Payer: Self-pay

## 2024-10-27 ENCOUNTER — Other Ambulatory Visit: Payer: Self-pay

## 2024-10-27 ENCOUNTER — Emergency Department (HOSPITAL_COMMUNITY)
Admission: EM | Admit: 2024-10-27 | Discharge: 2024-10-27 | Disposition: A | Discharge status: Home / Self Care | Attending: Emergency Medicine | Admitting: Emergency Medicine

## 2024-10-27 ENCOUNTER — Encounter (HOSPITAL_COMMUNITY): Payer: Self-pay

## 2024-10-27 DIAGNOSIS — N390 Urinary tract infection, site not specified: Secondary | ICD-10-CM | POA: Diagnosis not present

## 2024-10-27 DIAGNOSIS — Z79899 Other long term (current) drug therapy: Secondary | ICD-10-CM | POA: Diagnosis not present

## 2024-10-27 DIAGNOSIS — G20A1 Parkinson's disease without dyskinesia, without mention of fluctuations: Secondary | ICD-10-CM | POA: Diagnosis not present

## 2024-10-27 DIAGNOSIS — Z7901 Long term (current) use of anticoagulants: Secondary | ICD-10-CM | POA: Insufficient documentation

## 2024-10-27 DIAGNOSIS — T83098A Other mechanical complication of other indwelling urethral catheter, initial encounter: Secondary | ICD-10-CM | POA: Insufficient documentation

## 2024-10-27 DIAGNOSIS — T83090A Other mechanical complication of cystostomy catheter, initial encounter: Secondary | ICD-10-CM

## 2024-10-27 DIAGNOSIS — Z7982 Long term (current) use of aspirin: Secondary | ICD-10-CM | POA: Diagnosis not present

## 2024-10-27 LAB — CBC
HCT: 46.3 % (ref 39.0–52.0)
Hemoglobin: 15 g/dL (ref 13.0–17.0)
MCH: 29.9 pg (ref 26.0–34.0)
MCHC: 32.4 g/dL (ref 30.0–36.0)
MCV: 92.2 fL (ref 80.0–100.0)
Platelets: 275 K/uL (ref 150–400)
RBC: 5.02 MIL/uL (ref 4.22–5.81)
RDW: 14 % (ref 11.5–15.5)
WBC: 7.1 K/uL (ref 4.0–10.5)
nRBC: 0 % (ref 0.0–0.2)

## 2024-10-27 LAB — COMPREHENSIVE METABOLIC PANEL WITH GFR
ALT: 7 U/L (ref 0–44)
AST: 17 U/L (ref 15–41)
Albumin: 3.7 g/dL (ref 3.5–5.0)
Alkaline Phosphatase: 80 U/L (ref 38–126)
Anion gap: 9 (ref 5–15)
BUN: 13 mg/dL (ref 8–23)
CO2: 27 mmol/L (ref 22–32)
Calcium: 9.4 mg/dL (ref 8.9–10.3)
Chloride: 104 mmol/L (ref 98–111)
Creatinine, Ser: 0.92 mg/dL (ref 0.61–1.24)
GFR, Estimated: >60 mL/min
Glucose, Bld: 126 mg/dL — ABNORMAL HIGH (ref 70–99)
Potassium: 3.8 mmol/L (ref 3.5–5.1)
Sodium: 140 mmol/L (ref 135–145)
Total Bilirubin: 0.8 mg/dL (ref 0.0–1.2)
Total Protein: 6.8 g/dL (ref 6.5–8.1)

## 2024-10-27 LAB — URINALYSIS, ROUTINE W REFLEX MICROSCOPIC
Bilirubin Urine: NEGATIVE
Glucose, UA: NEGATIVE mg/dL
Ketones, ur: NEGATIVE mg/dL
Nitrite: POSITIVE — AB
Protein, ur: NEGATIVE mg/dL
Specific Gravity, Urine: 1.011 (ref 1.005–1.030)
WBC, UA: >50 WBC/hpf (ref 0–5)
pH: 7 (ref 5.0–8.0)

## 2024-10-27 MED ORDER — AMOXICILLIN-POT CLAVULANATE 875-125 MG PO TABS: 1.0000 | ORAL_TABLET | Freq: Two times a day (BID) | ORAL | 0 refills | Status: DC

## 2024-10-27 MED ORDER — AMOXICILLIN-POT CLAVULANATE 400-57 MG/5ML PO SUSR: 11.0000 mL | Freq: Two times a day (BID) | ORAL | 0 refills | Status: AC

## 2024-10-27 MED ORDER — PIPERACILLIN-TAZOBACTAM 3.375 G IVPB 30 MIN
3.3750 g | Freq: Once | INTRAVENOUS | Status: AC
  Administered 2024-10-27: 3.375 g via INTRAVENOUS
  Filled 2024-10-27: qty 50

## 2024-10-27 NOTE — Discharge Instructions (Addendum)
 It was our pleasure to provide your ER care today - we hope that you feel better.  Drink plenty of fluids/stay well hydrated.   Continue to keep area around suprapubic catheter very clean/dry.   Take antibiotic (augmentin) as prescribed.   Follow up closely with your doctor/urologist in the coming week - have them follow up on urine culture results then, and discuss plan as relates whether having recurrent utis vs chronic colonization issues.   Return to ER if worse, new symptoms, fevers, catheter not draining, severe abdominal or flank pain, persistent vomiting, or other concern.

## 2024-10-27 NOTE — ED Provider Notes (Signed)
 " [place] EMERGENCY DEPARTMENT AT [facility] Provider Note   CSN: [id number] Arrival date & time: 10/27/24  9372     Patient presents with: UTI symptoms   Robert [name] is a 79 y.o. male.   Pt with hx Parkinsons disease, hx cva w left hemiparesis, chronic suprapubic catheter/utis, indicates that last evening patients suprapubic catheter was quit draining through catheter, but instead was draining around catheter and some urine coming from penis. She did flush catheter - and currently urine is draining into tube/bag, with ~ 400 cc in bag from this AM, clear medium yellow, and no urine on suprapubic dressing or from penis. No abd pain or nv. Normal appetite. No fevers.  Reports suprapubic catheter was recently changed last week.  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  The history is provided by the patient, the spouse, medical records and the EMS personnel. The history is limited by the condition of the patient.       Prior to Admission medications  Medication Sig Start Date End Date Taking? Authorizing Provider  amoxicillin-clavulanate (AUGMENTIN) 875-125 MG tablet Take 1 tablet by mouth every 12 (twelve) hours. 10/27/24  Yes Bernard Drivers, MD  acetaminophen  (TYLENOL ) 500 MG tablet Take 1,000 mg by mouth every 6 (six) hours as needed for mild pain (pain score 1-3), moderate pain (pain score 4-6) or fever.    [provider]  apixaban  (ELIQUIS ) 5 MG TABS tablet Take 1 tablet (5 mg total) by mouth 2 (two) times daily. 05/27/21   Love, Sharlet RAMAN, PA-C  ascorbic acid (VITAMIN C) 250 MG tablet Take 250 mg by mouth in the morning and at bedtime.    [provider]  aspirin  EC 81 MG tablet Take 81 mg by mouth daily. Swallow whole.    [provider]  bacitracin 500 UNIT/GM ointment Apply 1 Application topically daily as needed for wound care (near catheter).    [provider]  busPIRone   (BUSPAR ) 10 MG tablet Take 5-10 mg by mouth See admin instructions. Take 5 mg by mouth in the morning and 5 mg bedtime    [provider]  carbidopa -levodopa  (SINEMET ) 25-100 MG tablet Take 2-3 tablets by mouth See admin instructions. Take 3 tablets by mouth at 7 AM & 12 Noon and 2 tablets between 5-6 PM/after the evening meal    [provider]  Cholecalciferol 50 MCG (2000 UT) TABS Take 2,000 Units by mouth daily.    [provider]  Dupilumab 300 MG/2ML SOAJ Inject 300 mg into the skin every 14 (fourteen) days.    [provider]  HYDROCORTISONE  ACE, RECTAL, 30 MG SUPP Place 30 mg rectally at bedtime.    [provider]  melatonin 3 MG TABS tablet Take 6 mg by mouth at bedtime.    [provider]  memantine  (NAMENDA ) 10 MG tablet Take 20 mg by mouth at bedtime.    [provider]  metFORMIN  (GLUCOPHAGE ) 500 MG tablet Take 500 mg by mouth at bedtime.    [provider]  methenamine  (MANDELAMINE) 1 g tablet Take 1,000 mg by mouth See admin instructions. Crush 1,000 mg and place into applesauce- take morning and bedtime    [provider]  miconazole  (MICOTIN) 2 % powder Apply 1 Application topically See admin instructions. Apply in the groin area once a day as needed    [provider]  nystatin -triamcinolone  ointment (MYCOLOG)  Apply 1 application  topically as needed (rash/yeast). 10/26/21   [provider]  omeprazole (PRILOSEC) 20 MG capsule Take 20 mg by mouth daily before breakfast.    [provider]  oxybutynin  (DITROPAN ) 5 MG tablet Take 2.5 mg by mouth in the morning and at bedtime.    [provider]  Pimavanserin  Tartrate (NUPLAZID ) 34 MG CAPS Take 34 mg by mouth daily.    [provider]  psyllium (METAMUCIL) 58.6 % powder Take 1 packet by mouth See admin instructions. Mix 2 teaspoonsful of powder into 16 ounces of water and drink by mouth once a day    [provider]  rasagiline  (AZILECT ) 1 MG TABS tablet Take 1 mg by mouth daily.    [provider]  saccharomyces boulardii (FLORASTOR) 250 MG capsule Take 1 capsule (250 mg total) by mouth 2 (two) times daily. 09/11/24   Regalado, Belkys A, MD  senna-docusate (SENOKOT-S) 8.6-50 MG tablet Take 2 tablets by mouth daily as needed for mild constipation or moderate constipation.    [provider]  simvastatin  (ZOCOR ) 20 MG tablet Take 10 mg by mouth at bedtime.    [provider]  tacrolimus  (PROTOPIC ) 0.1 % ointment Apply 1 application  topically See admin instructions. Apply to groin, armpits, and bottom 2 times a day when skin is broken out 11/06/21   [provider]  triamcinolone  cream (KENALOG) 0.1 % Apply 1 Application topically daily as needed (rash).    [provider]  zinc  oxide 20 % ointment Apply 1 Application topically See admin instructions. Every time there is a diaper change Patient taking differently: Apply 1 Application topically See admin instructions. Apply to affected areas with every pad change 01/27/24   [name], MD    Allergies: Fluconazole, Propofol , Lisinopril, Fluoxetine, Nystatin , and Sulfamethoxazole -trimethoprim     Review of Systems  Constitutional:  Negative for chills, diaphoresis and fever.  Respiratory:  Negative for cough and shortness of breath.   Cardiovascular:  Negative for chest pain.  Gastrointestinal:  Negative for abdominal pain, nausea and vomiting.  Genitourinary:  Negative for flank pain.  Musculoskeletal:  Negative for back pain.  Neurological:  Negative for headaches.    Updated Vital Signs BP (!) 141/85 (BP Location: Left Arm)   Pulse 91   Temp 99 F (37.2 C) (Oral)   Resp 16   Ht 1.93 m (6' 4)   Wt 104.6 kg   SpO2 100%   BMI 28.07 kg/m   Physical Exam Vitals and nursing note reviewed.  Constitutional:      Appearance: Normal appearance. He is well-developed.  HENT:     Head:  Atraumatic.     Nose: Nose normal.     Mouth/Throat:     Mouth: Mucous membranes are moist.     Pharynx: Oropharynx is clear.  Eyes:     General: No scleral icterus.    Conjunctiva/sclera: Conjunctivae normal.  Neck:     Trachea: No tracheal deviation.  Cardiovascular:     Rate and Rhythm: Normal rate and regular rhythm.     Pulses: Normal pulses.     Heart sounds: Normal heart sounds. No murmur heard.    No friction rub. No gallop.  Pulmonary:     Effort: Pulmonary effort is normal. No accessory muscle usage or respiratory distress.     Breath sounds: Normal breath sounds.  Abdominal:     General: Bowel sounds are normal. There is no distension.  Palpations: Abdomen is soft. There is no mass.     Tenderness: There is no abdominal tenderness. There is no guarding.     Comments: Suprapubic catheter dressing is clean and dry, and site is without sign of infection.   Genitourinary:    Comments: No cva tenderness. Suprapubic cath in place, yellow urine draining into tube and bag.  Musculoskeletal:        General: No swelling or tenderness.     Cervical back: Normal range of motion and neck supple. No rigidity.  Skin:    General: Skin is warm and dry.     Findings: No rash.  Neurological:     Mental Status: He is alert.     Comments: Alert, speech clear.   Psychiatric:        Mood and Affect: Mood normal.     (all labs ordered are listed, but only abnormal results are displayed) Results for orders placed or performed during the hospital encounter of 10/27/24  CBC   Collection Time: 10/27/24  7:53 AM  Result Value Ref Range   WBC 7.1 4.0 - 10.5 K/uL   RBC 5.02 4.22 - 5.81 MIL/uL   Hemoglobin 15.0 13.0 - 17.0 g/dL   HCT 53.6 60.9 - 47.9 %   MCV 92.2 80.0 - 100.0 fL   MCH 29.9 26.0 - 34.0 pg   MCHC 32.4 30.0 - 36.0 g/dL   RDW 85.9 88.4 - 84.4 %   Platelets 275 150 - 400 K/uL   nRBC 0.0 0.0 - 0.2 %  Comprehensive metabolic panel with GFR   Collection Time: 10/27/24   7:53 AM  Result Value Ref Range   Sodium 140 135 - 145 mmol/L   Potassium 3.8 3.5 - 5.1 mmol/L   Chloride 104 98 - 111 mmol/L   CO2 27 22 - 32 mmol/L   Glucose, Bld 126 (H) 70 - 99 mg/dL   BUN 13 8 - 23 mg/dL   Creatinine, Ser 9.07 0.61 - 1.24 mg/dL   Calcium 9.4 8.9 - 89.6 mg/dL   Total Protein 6.8 6.5 - 8.1 g/dL   Albumin 3.7 3.5 - 5.0 g/dL   AST 17 15 - 41 U/L   ALT 7 0 - 44 U/L   Alkaline Phosphatase 80 38 - 126 U/L   Total Bilirubin 0.8 0.0 - 1.2 mg/dL   GFR, Estimated >39 >39 mL/min   Anion gap 9 5 - 15  Urinalysis, Routine w reflex microscopic -Urine, Catheterized   Collection Time: 10/27/24  7:53 AM  Result Value Ref Range   Color, Urine YELLOW YELLOW   APPearance CLOUDY (A) CLEAR   Specific Gravity, Urine 1.011 1.005 - 1.030   pH 7.0 5.0 - 8.0   Glucose, UA NEGATIVE NEGATIVE mg/dL   Hgb urine dipstick SMALL (A) NEGATIVE   Bilirubin Urine NEGATIVE NEGATIVE   Ketones, ur NEGATIVE NEGATIVE mg/dL   Protein, ur NEGATIVE NEGATIVE mg/dL   Nitrite POSITIVE (A) NEGATIVE   Leukocytes,Ua LARGE (A) NEGATIVE   RBC / HPF 0-5 0 - 5 RBC/hpf   WBC, UA >50 0 - 5 WBC/hpf   Bacteria, UA MANY (A) NONE SEEN   Squamous Epithelial / HPF 6-10 0 - 5 /HPF     EKG: None  Radiology: No results found.   Procedures   Medications Ordered in the ED  piperacillin -tazobactam (ZOSYN ) IVPB 3.375 g (0 g Intravenous Stopped 10/27/24 0845)  Medical Decision Making Problems Addressed: Acute UTI: acute illness or injury with systemic symptoms that poses a threat to life or bodily functions Blocked suprapubic catheter, initial encounter: acute illness or injury    Details: resolved  Amount and/or Complexity of Data Reviewed Independent Historian: spouse and EMS    Details: hx External Data Reviewed: labs, radiology and notes. Labs: ordered. Decision-making details documented in ED Course.  Risk Prescription drug management. Decision regarding  hospitalization.   Labs ordered/sent.   Differential diagnosis includes uti, temporary blocked catheter, etc. Dispo decision including potential need for admission considered - will get labs and reassess.   Reviewed nursing notes and prior charts for additional history. External reports reviewed. Additional history from: EMS, spouse.  Labs reviewed/interpreted by me - wbc and hgb normal. Chem normal. Ua w > 50 wbc c/w uti. Urine culture sent. Zosyn  iv. Rx augmentin.   Recheck, po fluids/food. No nv. Overall pt is well, not toxic appearing, no systemic symptoms, no nv, no fever/chills or sweats, no abd or flank pain. No mental status change or weakness. Tolerating po. Recheck suprapubic site/abd, abd soft nt. No drainage around catheter. Clear urine in tube and draining in bag. Since arrival additional 200 cc urine output.   Pt currently appears stable for ed d/c.   Rec close pcp/urology f/u.  Return precautions provided.       Final diagnoses:  Blocked suprapubic catheter, initial encounter  Acute UTI    ED Discharge Orders          Ordered    amoxicillin-clavulanate (AUGMENTIN) 875-125 MG tablet  Every 12 hours        10/27/24 1050               Lucilla Petrenko, MD 10/27/24 1051  "

## 2024-10-27 NOTE — ED Notes (Signed)
 [facility] called

## 2024-10-27 NOTE — ED Triage Notes (Signed)
 Pt BIB [facility] from home, suprapubic catheter with sediment and foul odor x 2 days. Hx of frequent UTIs. Pt reports bladder spasms.   162/92 126 CBG HR 70 98.5 T

## 2024-10-29 LAB — URINE CULTURE: Culture: >=100000 — AB

## 2024-10-30 ENCOUNTER — Telehealth (HOSPITAL_BASED_OUTPATIENT_CLINIC_OR_DEPARTMENT_OTHER): Payer: Self-pay

## 2024-10-30 NOTE — Progress Notes (Signed)
 ED Antimicrobial Stewardship Positive Culture Follow Up   Robert [name] is an 79 y.o. male who presented to [facility] on 10/27/2024 with a chief complaint of  Chief Complaint  Patient presents with   UTI symptoms    Recent Results (from the past 720 hours)  Urine Culture     Status: Abnormal   Collection Time: 10/27/24  7:53 AM   Specimen: Urine, Catheterized  Result Value Ref Range Status   Specimen Description   Final    URINE, CATHETERIZED Performed at [facility], [address], [place], [place] [address]    Special Requests   Final    NONE Performed at [facility], [address], [place], [place] [address]    Culture >=100,000 COLONIES/mL KLEBSIELLA PNEUMONIAE (A)  Final   Report Status 10/29/2024 FINAL  Final   Organism ID, Bacteria KLEBSIELLA PNEUMONIAE (A)  Final      Susceptibility   Klebsiella pneumoniae - MIC*    AMPICILLIN >=32 RESISTANT Resistant     CEFAZOLIN  (URINE) Value in next row Resistant      >=32 RESISTANTThis is a modified FDA-approved test that has been validated and its performance characteristics determined by the reporting laboratory.  This laboratory is certified under the Clinical Laboratory Improvement Amendments CLIA as qualified to perform high complexity clinical laboratory testing.    CEFEPIME  Value in next row Sensitive      >=32 RESISTANTThis is a modified FDA-approved test that has been validated and its performance characteristics determined by the reporting laboratory.  This laboratory is certified under the Clinical Laboratory Improvement Amendments CLIA as qualified to perform high complexity clinical laboratory testing.    ERTAPENEM Value in next row Intermediate      >=32 RESISTANTThis is a modified FDA-approved test that has been validated and its performance characteristics determined by the reporting laboratory.  This laboratory is certified under the Clinical Laboratory Improvement Amendments  CLIA as qualified to perform high complexity clinical laboratory testing.    CEFTRIAXONE  Value in next row Sensitive      >=32 RESISTANTThis is a modified FDA-approved test that has been validated and its performance characteristics determined by the reporting laboratory.  This laboratory is certified under the Clinical Laboratory Improvement Amendments CLIA as qualified to perform high complexity clinical laboratory testing.    CIPROFLOXACIN  Value in next row Resistant      >=32 RESISTANTThis is a modified FDA-approved test that has been validated and its performance characteristics determined by the reporting laboratory.  This laboratory is certified under the Clinical Laboratory Improvement Amendments CLIA as qualified to perform high complexity clinical laboratory testing.    GENTAMICIN Value in next row Sensitive      >=32 RESISTANTThis is a modified FDA-approved test that has been validated and its performance characteristics determined by the reporting laboratory.  This laboratory is certified under the Clinical Laboratory Improvement Amendments CLIA as qualified to perform high complexity clinical laboratory testing.    NITROFURANTOIN Value in next row Resistant      >=32 RESISTANTThis is a modified FDA-approved test that has been validated and its performance characteristics determined by the reporting laboratory.  This laboratory is certified under the Clinical Laboratory Improvement Amendments CLIA as qualified to perform high complexity clinical laboratory testing.    TRIMETH /SULFA  Value in next row Resistant      >=32 RESISTANTThis is a modified FDA-approved test that has been validated and its performance characteristics determined by the reporting laboratory.  This  laboratory is certified under the Clinical Laboratory Improvement Amendments CLIA as qualified to perform high complexity clinical laboratory testing.    AMPICILLIN/SULBACTAM Value in next row Resistant      >=32 RESISTANTThis  is a modified FDA-approved test that has been validated and its performance characteristics determined by the reporting laboratory.  This laboratory is certified under the Clinical Laboratory Improvement Amendments CLIA as qualified to perform high complexity clinical laboratory testing.    PIP/TAZO Value in next row Sensitive      8 SENSITIVEThis is a modified FDA-approved test that has been validated and its performance characteristics determined by the reporting laboratory.  This laboratory is certified under the Clinical Laboratory Improvement Amendments CLIA as qualified to perform high complexity clinical laboratory testing.    MEROPENEM  Value in next row Sensitive      8 SENSITIVEThis is a modified FDA-approved test that has been validated and its performance characteristics determined by the reporting laboratory.  This laboratory is certified under the Clinical Laboratory Improvement Amendments CLIA as qualified to perform high complexity clinical laboratory testing.    * >=100,000 COLONIES/mL KLEBSIELLA PNEUMONIAE    [x]  Treated with augmentin , organism resistant to prescribed antimicrobial  78 YOM with chronic catheter who presented d/t not draining well, improved with flushing. Temp 99.7, WBC wnl, no systemic s/sx of UTI, no flank pain. Culture likely representative of colonization and noted no oral options.  Call to discontinue Augmentin  and monitor off antibiotics, culture likely colonization.  ED Provider: Lonni Sakai, MD  Thank you for allowing pharmacy to be a part of this patients care.  Almarie Lunger, PharmD, BCPS, BCIDP Infectious Diseases Clinical Pharmacist 10/30/2024 8:15 AM   **Pharmacist phone directory can now be found on [email or web address] ([email or web address]).  Listed under [facility].

## 2024-10-30 NOTE — Telephone Encounter (Signed)
 Post ED Visit - Positive Culture Follow-up  Culture report reviewed by antimicrobial stewardship pharmacist: [facility] []  Rankin Dee, Pharm.D. []  Venetia Gully, Pharm.D., BCPS AQ-ID []  Garrel Crews, Pharm.D., BCPS [x]  Almarie Lunger, Pharm.D., BCPS []  [place], [address].D., BCPS, AAHIVP []  Rosaline Bihari, Pharm.D., BCPS, AAHIVP []  Vernell Meier, PharmD, BCPS []  Latanya Hint, PharmD, BCPS []  Donald Medley, PharmD, BCPS []  Rocky Bold, PharmD []  Dorothyann Alert, PharmD, BCPS []  Morene Babe, PharmD  [facility] []  Rosaline Edison, PharmD []  Romona Bliss, PharmD []  Dolphus Roller, PharmD []  Veva Seip, Rph []  Vernell Daunt) Leonce, PharmD []  Eva Allis, PharmD []  Rosaline Millet, PharmD []  Iantha Batch, PharmD []  Arvin Gauss, PharmD []  Wanda Hasting, PharmD []  Ronal Rav, PharmD []  Rocky Slade, PharmD []  Bard Jeans, PharmD   Positive urine culture Reviewed by ED provider Lonni Sakai, MD Plan: call pt and discontinue augmentin  and monitor off antibiotics. Likely colonization.   Called and spoke with patients wife and instructions given. Pt wife [facility].   Ruth Camelia Elbe 10/30/2024, 10:20 AM

## 2024-11-15 ENCOUNTER — Emergency Department (HOSPITAL_COMMUNITY)

## 2024-11-15 ENCOUNTER — Emergency Department (HOSPITAL_COMMUNITY)
Admission: EM | Admit: 2024-11-15 | Discharge: 2024-11-15 | Disposition: A | Discharge status: Home / Self Care | Attending: Emergency Medicine | Admitting: Emergency Medicine

## 2024-11-15 DIAGNOSIS — I10 Essential (primary) hypertension: Secondary | ICD-10-CM | POA: Diagnosis not present

## 2024-11-15 DIAGNOSIS — Y732 Prosthetic and other implants, materials and accessory gastroenterology and urology devices associated with adverse incidents: Secondary | ICD-10-CM | POA: Insufficient documentation

## 2024-11-15 DIAGNOSIS — Z7984 Long term (current) use of oral hypoglycemic drugs: Secondary | ICD-10-CM | POA: Insufficient documentation

## 2024-11-15 DIAGNOSIS — Z79899 Other long term (current) drug therapy: Secondary | ICD-10-CM | POA: Diagnosis not present

## 2024-11-15 DIAGNOSIS — E119 Type 2 diabetes mellitus without complications: Secondary | ICD-10-CM | POA: Diagnosis not present

## 2024-11-15 DIAGNOSIS — G20C Parkinsonism, unspecified: Secondary | ICD-10-CM | POA: Diagnosis not present

## 2024-11-15 DIAGNOSIS — Z7982 Long term (current) use of aspirin: Secondary | ICD-10-CM | POA: Diagnosis not present

## 2024-11-15 DIAGNOSIS — Z8673 Personal history of transient ischemic attack (TIA), and cerebral infarction without residual deficits: Secondary | ICD-10-CM | POA: Insufficient documentation

## 2024-11-15 DIAGNOSIS — T83010A Breakdown (mechanical) of cystostomy catheter, initial encounter: Secondary | ICD-10-CM | POA: Diagnosis not present

## 2024-11-15 DIAGNOSIS — Z7901 Long term (current) use of anticoagulants: Secondary | ICD-10-CM | POA: Diagnosis not present

## 2024-11-15 DIAGNOSIS — R339 Retention of urine, unspecified: Secondary | ICD-10-CM | POA: Diagnosis present

## 2024-11-15 HISTORY — PX: IR CYSTOSTOMY TUBE PLACEMENT/BLADDER ASPIRATION: IMG1097

## 2024-11-15 MED ORDER — LIDOCAINE VISCOUS HCL 2 % MT SOLN
OROMUCOSAL | Status: AC
  Filled 2024-11-15: qty 15

## 2024-11-15 MED ORDER — IOHEXOL 300 MG/ML  SOLN
50.0000 mL | Freq: Once | INTRAMUSCULAR | Status: AC | PRN
  Administered 2024-11-15: 25 mL

## 2024-11-15 MED ORDER — LIDOCAINE HCL 1 % IJ SOLN
INTRAMUSCULAR | Status: AC
  Filled 2024-11-15: qty 20

## 2024-11-15 MED ORDER — LIDOCAINE VISCOUS HCL 2 % MT SOLN
15.0000 mL | Freq: Once | OROMUCOSAL | Status: AC
  Administered 2024-11-15: 5 mL via OROMUCOSAL

## 2024-11-15 MED ORDER — LIDOCAINE HCL 1 % IJ SOLN: 20.0000 mL | Freq: Once | INTRAMUSCULAR | Status: DC

## 2024-11-15 NOTE — ED Triage Notes (Signed)
 Pt BIB [facility] from home, pt has a suprapubic catheter that was removed and needs it to be replaced. Home health aide had a difficult time inserting the new catheter. Pt has hx of parkinson's.    BP 158/88 HR 60 98% spo2 16 rr

## 2024-11-15 NOTE — ED Provider Notes (Addendum)
 " [place] EMERGENCY DEPARTMENT AT [facility] Provider Note   CSN: [id number] Arrival date & time: 11/15/24  1238     Patient presents with: No chief complaint on file.   Robert [name] is a 79 y.o. male.   HPI 79 yo male ho suprapubic catheter placed since June 2025 secondary, ho stroke and Parkinson's.  HHN removed catheter today and tried to replace with 16 and unable to replace with 14 today.Removed at 1130 and was draining ok No other complaints per wife. Wife and caregiver at bedside. Patient goes to [place]     Prior to Admission medications  Medication Sig Start Date End Date Taking? Authorizing Provider  acetaminophen  (TYLENOL ) 500 MG tablet Take 1,000 mg by mouth every 6 (six) hours as needed for mild pain (pain score 1-3), moderate pain (pain score 4-6) or fever.    [provider]  apixaban  (ELIQUIS ) 5 MG TABS tablet Take 1 tablet (5 mg total) by mouth 2 (two) times daily. 05/27/21   Love, Sharlet RAMAN, PA-C  ascorbic acid (VITAMIN C) 250 MG tablet Take 250 mg by mouth in the morning and at bedtime.    [provider]  aspirin  EC 81 MG tablet Take 81 mg by mouth daily. Swallow whole.    [provider]  bacitracin 500 UNIT/GM ointment Apply 1 Application topically daily as needed for wound care (near catheter).    [provider]  busPIRone  (BUSPAR ) 10 MG tablet Take 5-10 mg by mouth See admin instructions. Take 5 mg by mouth in the morning and 5 mg bedtime    [provider]  carbidopa -levodopa  (SINEMET ) 25-100 MG tablet Take 2-3 tablets by mouth See admin instructions. Take 3 tablets by mouth at 7 AM & 12 Noon and 2 tablets between 5-6 PM/after the evening meal    [provider]  Cholecalciferol 50 MCG (2000 UT) TABS Take 2,000 Units by mouth daily.    [provider]  Dupilumab 300 MG/2ML SOAJ Inject 300 mg into the skin every 14 (fourteen) days.    [provider]  HYDROCORTISONE  ACE, RECTAL, 30  MG SUPP Place 30 mg rectally at bedtime.    [provider]  melatonin 3 MG TABS tablet Take 6 mg by mouth at bedtime.    [provider]  memantine  (NAMENDA ) 10 MG tablet Take 20 mg by mouth at bedtime.    [provider]  metFORMIN  (GLUCOPHAGE ) 500 MG tablet Take 500 mg by mouth at bedtime.    [provider]  methenamine  (MANDELAMINE) 1 g tablet Take 1,000 mg by mouth See admin instructions. Crush 1,000 mg and place into applesauce- take morning and bedtime    [provider]  miconazole  (MICOTIN) 2 % powder Apply 1 Application topically See admin instructions. Apply in the groin area once a day as needed    [provider]  nystatin -triamcinolone  ointment (MYCOLOG) Apply 1 application  topically as needed (rash/yeast). 10/26/21   [provider]  omeprazole (PRILOSEC) 20 MG capsule Take 20 mg by mouth daily before breakfast.    [provider]  oxybutynin  (DITROPAN ) 5 MG tablet Take 2.5 mg by mouth in the morning and at bedtime.    [provider]  Pimavanserin  Tartrate (NUPLAZID ) 34 MG CAPS Take 34 mg by mouth daily.    [provider]  psyllium (METAMUCIL) 58.6 % powder Take 1 packet by mouth See admin instructions. Mix 2 teaspoonsful of powder into 16 ounces of  water and drink by mouth once a day    [provider]  rasagiline  (AZILECT ) 1 MG TABS tablet Take 1 mg by mouth daily.    [provider]  saccharomyces boulardii (FLORASTOR) 250 MG capsule Take 1 capsule (250 mg total) by mouth 2 (two) times daily. 09/11/24   Regalado, Belkys A, MD  senna-docusate (SENOKOT-S) 8.6-50 MG tablet Take 2 tablets by mouth daily as needed for mild constipation or moderate constipation.    [provider]  simvastatin  (ZOCOR ) 20 MG tablet Take 10 mg by mouth at bedtime.    [provider]  tacrolimus  (PROTOPIC ) 0.1 % ointment Apply 1 application  topically See admin instructions. Apply to  groin, armpits, and bottom 2 times a day when skin is broken out 11/06/21   [provider]  triamcinolone  cream (KENALOG) 0.1 % Apply 1 Application topically daily as needed (rash).    [provider]  zinc  oxide 20 % ointment Apply 1 Application topically See admin instructions. Every time there is a diaper change Patient taking differently: Apply 1 Application topically See admin instructions. Apply to affected areas with every pad change 01/27/24   [name], MD    Allergies: Fluconazole, Propofol , Lisinopril, Fluoxetine, Nystatin , and Sulfamethoxazole -trimethoprim     Review of Systems  Updated Vital Signs BP (!) 180/89 (BP Location: Right Arm)   Pulse 76   Temp 99 F (37.2 C) (Oral)   Resp 14   SpO2 100%   Physical Exam Vitals and nursing note reviewed.  HENT:     Head: Normocephalic.     Right Ear: External ear normal.     Nose: Nose normal.  Eyes:     Pupils: Pupils are equal, round, and reactive to light.  Cardiovascular:     Rate and Rhythm: Normal rate.     Pulses: Normal pulses.  Pulmonary:     Effort: Pulmonary effort is normal.  Abdominal:     General: Abdomen is flat.     Palpations: Abdomen is soft.     Comments: Suprapubic catheter site with urine draining  Musculoskeletal:     Cervical back: Normal range of motion.  Skin:    General: Skin is warm.     Capillary Refill: Capillary refill takes less than 2 seconds.  Neurological:     Mental Status: He is alert.     (all labs ordered are listed, but only abnormal results are displayed) Labs Reviewed - No data to display  EKG: None  Radiology: No results found.   BLADDER CATHETERIZATION  Date/Time: 11/15/2024 2:42 PM  Performed by: Levander Houston, MD Authorized by: Levander Houston, MD   Consent:    Consent obtained:  Verbal   Consent given by:  Spouse   Risks discussed:  False passage Universal protocol:    Patient identity confirmed:  Verbally with patient Anesthesia:     Anesthesia method:  None Procedure details:    Procedure performed by provider due to: Suprapubic catheter previously in place, attempted to place 16 Foley suprapubic catheter.   Catheter insertion:  Indwelling   Catheter type:  Foley   Catheter size:  16 Fr   Bladder irrigation: no     Number of attempts:  1   Urine appearance: Unable to place. Post-procedure details:    Procedure completion:  Tolerated well, no immediate complications Comments:     Unable to replace suprapubic catheter Urology consulted    Medications Ordered in the ED - No data to display  Medical Decision Making  79 year old male history of suprapubic catheter removed today by home health care nurse who could not replace it.  Here in the ED I attempted to replace it but I am unable to pass catheter.  Discussed with urology and they will see for replacement here in the ED Ole Bras, PA, saw patient for urology.  He was unable to pass suprapubic catheter and advises to call interventional radiology. Care discussed with Dr. Philip, on-call for interventional radiology at [facility].  He will contact IR here regarding this patient.  Care discussed with Dr. Hughes who will evaluate patient. Care discussed with Dr. Simon who will dispo as appropriate     Final diagnoses:  Suprapubic catheter dysfunction, initial encounter    ED Discharge Orders     None          Levander Houston, MD 11/15/24 1539    Levander Houston, MD 11/15/24 1557  "

## 2024-11-15 NOTE — Procedures (Signed)
 Vascular and Interventional Radiology Procedure Note  Patient: Robert [name] DOB: 1946-08-29 Medical Record Number: [id number] Note Date/Time: 11/15/24 5:01 PM   Performing Physician: Thom Hall, MD Assistant(s): None  Diagnosis: Catheter fell out.  Procedure: SUPRAPUBIC CYSTOSTOMY TUBE REPLACEMENT  Anesthesia: Local Anesthetic Complications: None Estimated Blood Loss: Minimal  Findings:  Successful replacement for a new 24F Council suprapubic cystostomy tube under fluoroscopy.  Plan: Pt to follow up with Urology for routine SP catheter exchanges.   See detailed procedure note with images in PACS. The patient tolerated the procedure well without incident or complication and was returned to Recovery in stable condition.    Thom Hall, MD [facility] Radiology   Pager. [phone] Clinic. [phone]

## 2024-11-15 NOTE — H&P (Signed)
"    ° °  Patient Status: [facility] - In-pt  Assessment and Plan: Neurogenic bladder s/p SPT placement 04/2024, dislodged.  Patient in need of suprapubic catheter replacement.  Attempt made at bedside by Urology with wire was unsuccessful.  Patient likely to require tract dilation.  IR consulted for exchange.  Case approved by Dr. Hughes.   Risks and benefits discussed with the patient's wife including bleeding, infection, damage to adjacent structures, and sepsis.  All of the patient's wife's questions were answered, patient is agreeable to proceed. Consent signed and in chart.  ______________________________________________________________________   History of Present Illness: Robert [name] is a 79 y.o. male with neurogenic bladder who underwent attempt at routine suprapubic catheter exchange at home with out success.  He presented to [facility] ED for replacement.  Attempt at bedside by Urology also unsuccessful.  Foley catheter currently in place. Will bring to IR for replacement.   Allergies and medications reviewed.   Review of Systems: A 12 point ROS discussed and pertinent positives are indicated in the HPI above.  All other systems are negative.  Review of Systems  Constitutional:  Negative for fatigue and fever.  Respiratory:  Negative for cough and shortness of breath.   Gastrointestinal:  Negative for abdominal pain.  Genitourinary:  Positive for difficulty urinating.  Musculoskeletal:  Negative for back pain.  Psychiatric/Behavioral:  Negative for behavioral problems and confusion.     Vital Signs: BP (!) 180/89 (BP Location: Right Arm)   Pulse 76   Temp 99 F (37.2 C) (Oral)   Resp 14   SpO2 100%   Physical Exam Vitals and nursing note reviewed.  Constitutional:      General: He is not in acute distress.    Appearance: Normal appearance. He is not ill-appearing.  HENT:     Mouth/Throat:     Mouth: Mucous membranes are moist.     Pharynx: Oropharynx is clear.   Genitourinary:    Comments: SPT tract visible. Foley catheter in place.  Neurological:     General: No focal deficit present.     Mental Status: He is alert. Mental status is at baseline.      Imaging reviewed.   Labs:  COAGS: Recent Labs    04/10/24 0842 04/10/24 2038 05/22/24 0851 09/08/24 1148  INR 1.1 1.1 1.1 1.3*  APTT  --   --   --  32    BMP: Recent Labs    09/08/24 0545 09/09/24 0344 09/10/24 0405 10/27/24 0753  NA 137 141 137 140  K 4.0 3.9 4.5 3.8  CL 102 106 104 104  CO2 25 25 26 27   GLUCOSE 176* 113* 116* 126*  BUN 15 14 13 13   CALCIUM 9.2 8.9 8.8* 9.4  CREATININE 0.98 0.86 0.94 0.92  GFRNONAA >60 >60 >60 >60       Electronically Signed: Solmon Selmer Ku, PA 11/15/2024, 4:07 PM   I spent a total of 15 minutes in face to face in clinical consultation, greater than 50% of which was counseling/coordinating care for neurogenic bladder   "

## 2024-11-15 NOTE — ED Provider Notes (Signed)
" °  Physical Exam  BP (!) 178/85   Pulse 87   Temp 99 F (37.2 C) (Oral)   Resp 18   SpO2 100%   Physical Exam Vitals and nursing note reviewed.  Constitutional:      General: He is not in acute distress.    Appearance: He is well-developed.  HENT:     Head: Normocephalic and atraumatic.  Eyes:     Conjunctiva/sclera: Conjunctivae normal.  Cardiovascular:     Rate and Rhythm: Normal rate and regular rhythm.     Heart sounds: No murmur heard. Pulmonary:     Effort: Pulmonary effort is normal. No respiratory distress.     Breath sounds: Normal breath sounds.  Abdominal:     Palpations: Abdomen is soft.     Tenderness: There is no abdominal tenderness.  Musculoskeletal:        General: No swelling.     Cervical back: Neck supple.  Skin:    General: Skin is warm and dry.     Capillary Refill: Capillary refill takes less than 2 seconds.  Neurological:     Mental Status: He is alert.  Psychiatric:        Mood and Affect: Mood normal.     Procedures  Procedures  ED Course / MDM    Medical Decision Making     Presents because of urinary retention.  Urinary catheter.  Try to remove today and replace if unable to place.  Interventional radiology been contacted about suprapubic catheter placement.   IR able to place suprapubic catheter.  Draining urine.  Patient asymptomatic.  Will remove urinary foley cath before discharge home.   Will follow up with urology.    Simon Lavonia SAILOR, MD 11/15/24 1737  "

## 2024-11-15 NOTE — Discharge Instructions (Signed)
 Please follow up with your urologist.   If you develop pain, fevers or stop producing urine then please come back to ED immediately

## 2024-11-15 NOTE — Procedures (Signed)
" ° °  Procedure: insert indwelling catheter bedside   Urology Procedure Note: 79 year old male with displaced suprapubic tube.  Neurogenic bladder 2/2 stroke and Parkinson's.  Please see separate consult note.  Discussed the case and plan with patient and his family at bedside.  After consent was provided.  Patient was prepped and draped in the usual sterile fashion.  The suprapubic tract was visible and I was initially able to advance into the bladder with a small introducer from the zip wire.  Wire was successfully placed but 8f council, and eventually silicone 44f council fashioned at bedside, were both unable to pass the fascia.  At this point shared decision was made to abort the procedure and place an 23f coud catheter.  Patient was prepped and 10 mL of viscous lidocaine  lubricant were injected directly into the urethra.  After adequate time for anesthetic effect been provided an 60f catheter was advanced to the level of the bladder without resistance.  There was immediate return of clear yellow urine.  Having confirmed placement, the balloon was inflated with 10 mL of sterile water and placed to gravity drainage without dependent loops, concluding the procedure.  Recommended IR consultation for tract dilation.  [facility] if SPT is not replaced.   Ole Bourdon, NP [facility] Pager: [phone]   "

## 2024-11-15 NOTE — Consult Note (Signed)
 "  Urology Consult Note   Requesting Attending Physician:  Levander Houston, MD Service Providing Consult: Urology  Consulting Attending: Dr. Gaston   Reason for Consult: Displaced suprapubic tube  HPI: Robert [name] is seen in consultation for reasons noted above at the request of Levander Houston, MD. Patient is a 79 y.o. male presenting to [facility] emergency department after home health nurse attempted to exchange suprapubic tube and lost track.  PMH of neurogenic bladder 2/2 stroke and Parkinson's.  SPT placed 03/2024. ------------------  Assessment:   79 y.o. male with    Recommendations: # Displaced suprapubic tube Lost track with home health nurse.  Please see separate procedure note.  Was able to advance wire into the bladder with introducer but could not advance as small as 43f catheter through the fascia. 12F coud catheter placed in the interim with clear yellow urine. Will need dilation with interventional radiology.  Frequent clogging due to sediment.  Would recommend upsizing to around 64f if able. [facility].  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   Diabetes mellitus without complication (HCC)    Diverticulosis    Frequency of urination    Gallstone pancreatitis 01/07/2021   GERD (gastroesophageal reflux disease)    Gross hematuria    History of acute pyelonephritis    10-13-2012   History of CVA with residual deficit    2008--  left side of body weakness and foot drop (wears leg brace and uses cane)   History of DVT of lower extremity    2008--  cva   Hypercholesteremia    Hyperlipidemia    Hyperlipidemia    Hypertension    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 2008   Left leg DVT (HCC)    Neuromuscular disorder (HCC)     Parkinsons   S/P insertion of IVC (inferior vena caval) filter    2008   Urethral stricture    Urgency of urination    Urinary retention    Weakness of left side of body    secondary to cva 2008   Wears glasses    Wears hearing aid    bilateral-- wears intermittantly    Past Surgical History:  Past Surgical History:  Procedure Laterality Date   CYSTOSCOPY WITH RETROGRADE URETHROGRAM N/A 10/23/2015   Procedure: CYSTOSCOPY WITH RETROGRADE URETHROGRAM;  Surgeon: Alm Fragmin, MD;  Location[facility];  Service: Urology;  Laterality: N/A;   CYSTOSCOPY WITH URETHRAL DILATATION N/A 10/23/2015   Procedure: CYSTOSCOPY WITH URETHRAL BALLOON DILATATION;  Surgeon: Alm Fragmin, MD;  Location[facility];  Service: Urology;  Laterality: N/A;  BALLOON DILATION    IR CYSTOSTOMY TUBE CHANGE COMPLICATED W IMG  05/22/2024   IVC FILTER PLACEMENT ([facility] HX)  2008   LAPAROSCOPIC CHOLECYSTECTOMY SINGLE PORT N/A 01/09/2021   Procedure: LAPAROSCOPIC CHOLECYSTECTOMY WITH IOC;  Surgeon: Sheldon Standing, MD;  Location: [facility];  Service: General;  Laterality: N/A;  90 MIN   TRANSCAROTID ARTERY REVASCULARIZATION  Left 05/12/2021   Procedure: LEFT TRANSCAROTID ARTERY REVASCULARIZATION;  Surgeon: Gretta Lonni PARAS, MD;  Location: [facility] OR;  Service: Vascular;  Laterality: Left;    Medication: No current facility-administered medications for this encounter.   Current Outpatient Medications  Medication Sig Dispense Refill   acetaminophen  (TYLENOL ) 500 MG tablet Take 1,000 mg by mouth every 6 (six) hours as needed for mild pain (pain score 1-3), moderate pain (pain score 4-6) or fever.     apixaban  (ELIQUIS ) 5 MG TABS tablet Take 1 tablet (5 mg total) by mouth 2 (two) times daily. 60 tablet 0   ascorbic acid (VITAMIN C) 250 MG tablet Take 250 mg by mouth in the morning and at bedtime.     aspirin  EC 81 MG tablet Take 81 mg by mouth daily. Swallow whole.     bacitracin 500 UNIT/GM  ointment Apply 1 Application topically daily as needed for wound care (near catheter).     busPIRone  (BUSPAR ) 10 MG tablet Take 5-10 mg by mouth See admin instructions. Take 5 mg by mouth in the morning and 5 mg bedtime     carbidopa -levodopa  (SINEMET ) 25-100 MG tablet Take 2-3 tablets by mouth See admin instructions. Take 3 tablets by mouth at 7 AM & 12 Noon and 2 tablets between 5-6 PM/after the evening meal     Cholecalciferol 50 MCG (2000 UT) TABS Take 2,000 Units by mouth daily.     Dupilumab 300 MG/2ML SOAJ Inject 300 mg into the skin every 14 (fourteen) days.     HYDROCORTISONE  ACE, RECTAL, 30 MG SUPP Place 30 mg rectally at bedtime.     melatonin 3 MG TABS tablet Take 6 mg by mouth at bedtime.     memantine  (NAMENDA ) 10 MG tablet Take 20 mg by mouth at bedtime.     metFORMIN  (GLUCOPHAGE ) 500 MG tablet Take 500 mg by mouth at bedtime.     methenamine  (MANDELAMINE) 1 g tablet Take 1,000 mg by mouth See admin instructions. Crush 1,000 mg and place into applesauce- take morning and bedtime     miconazole  (MICOTIN) 2 % powder Apply 1 Application topically See admin instructions. Apply in the groin area once a day as needed     nystatin -triamcinolone  ointment (MYCOLOG) Apply 1 application  topically as needed (rash/yeast).     omeprazole (PRILOSEC) 20 MG capsule Take 20 mg by mouth daily before breakfast.     oxybutynin  (DITROPAN ) 5 MG tablet Take 2.5 mg by mouth in the morning and at bedtime.     Pimavanserin  Tartrate (NUPLAZID ) 34 MG CAPS Take 34 mg by mouth daily.     psyllium (METAMUCIL) 58.6 % powder Take 1 packet by mouth See admin instructions. Mix 2 teaspoonsful of powder into 16 ounces of water and drink by mouth once a day     rasagiline  (AZILECT ) 1 MG TABS tablet Take 1 mg by mouth daily.     saccharomyces boulardii (FLORASTOR) 250 MG capsule Take 1 capsule (250 mg total) by mouth 2 (two) times daily. 30 capsule 0   senna-docusate (SENOKOT-S) 8.6-50 MG tablet Take 2 tablets by mouth  daily as needed for mild constipation or moderate constipation.     simvastatin  (ZOCOR ) 20 MG tablet Take 10 mg by mouth at bedtime.     tacrolimus  (PROTOPIC ) 0.1 % ointment Apply 1 application  topically See admin instructions. Apply to groin, armpits, and bottom 2 times a day when skin is broken out     triamcinolone  cream (KENALOG) 0.1 % Apply 1 Application topically daily as needed (rash).     zinc  oxide 20 % ointment Apply 1 Application topically See admin instructions. Every time there is a diaper change (Patient taking differently: Apply 1 Application topically See admin instructions. Apply  to affected areas with every pad change) 56.7 g 0    Allergies: Allergies[1]  Social History: Social History[2]  Family History Family History  Problem Relation Age of Onset   Diabetes Sister    Lung cancer Brother        Post 9/11 voluntary work in [employer]   Prostate cancer Brother    Other Mother        passed away young- non medical   Alzheimer's disease Father    Healthy Son     Review of Systems  Genitourinary:  Negative for dysuria, flank pain, frequency, hematuria and urgency.     Objective   Vital signs in last 24 hours: BP (!) 180/89 (BP Location: Right Arm)   Pulse 76   Temp 99 F (37.2 C) (Oral)   Resp 14   SpO2 100%   Physical Exam General: A&O, resting, appropriate HEENT: [place]/AT Pulmonary: Normal work of breathing Cardiovascular: no cyanosis GU: 81F coud catheter placed per urethra   Most Recent Labs: Lab Results  Component Value Date   WBC 7.1 10/27/2024   HGB 15.0 10/27/2024   HCT 46.3 10/27/2024   PLT 275 10/27/2024    Lab Results  Component Value Date   NA 140 10/27/2024   K 3.8 10/27/2024   CL 104 10/27/2024   CO2 27 10/27/2024   BUN 13 10/27/2024   CREATININE 0.92 10/27/2024   CALCIUM 9.4 10/27/2024   MG 1.7 04/11/2024   PHOS 3.1 02/11/2022    Lab Results  Component Value Date   INR 1.3 (H) 09/08/2024   APTT 32 09/08/2024     Urine  Culture: @[id number] (laburin,org,[id number],[id number])@   IMAGING: No results found.  ------  Ole Bourdon, NP Pager: [phone]   Please contact the urology consult pager with any further questions/concerns.     [1]  Allergies Allergen Reactions   Fluconazole Dermatitis and Rash   Propofol  Other (See Comments)    Hiccups for weeks   Lisinopril Cough   Fluoxetine Nausea Only and Other (See Comments)    Hallucinations, Feeling nervous, Nausea, Tachycardia also   Nystatin  Rash   Sulfamethoxazole -Trimethoprim  Diarrhea  [2]  Social History Tobacco Use   Smoking status: Former    Current packs/day: 0.00    Types: Cigarettes    Start date: 10/26/1960    Quit date: 10/26/1970    Years since quitting: 54.0   Smokeless tobacco: Never  Vaping Use   Vaping status: Never Used  Substance Use Topics   Alcohol use: No   Drug use: No   "

## 2024-11-15 NOTE — ED Notes (Signed)
 Pt returned from IR.
